# Patient Record
Sex: Male | Born: 1950 | Race: White | Hispanic: No | Marital: Married | Smoking: Former smoker
Health system: Southern US, Community
[De-identification: ages and names within clinical notes are randomized; demographics above are authoritative.]

## PROBLEM LIST (undated history)

## (undated) DIAGNOSIS — G8929 Other chronic pain: Secondary | ICD-10-CM

## (undated) DIAGNOSIS — I219 Acute myocardial infarction, unspecified: Secondary | ICD-10-CM

## (undated) DIAGNOSIS — Z87442 Personal history of urinary calculi: Secondary | ICD-10-CM

## (undated) DIAGNOSIS — I255 Ischemic cardiomyopathy: Secondary | ICD-10-CM

## (undated) DIAGNOSIS — E785 Hyperlipidemia, unspecified: Secondary | ICD-10-CM

## (undated) DIAGNOSIS — Z952 Presence of prosthetic heart valve: Secondary | ICD-10-CM

## (undated) DIAGNOSIS — I350 Nonrheumatic aortic (valve) stenosis: Secondary | ICD-10-CM

## (undated) DIAGNOSIS — I209 Angina pectoris, unspecified: Secondary | ICD-10-CM

## (undated) DIAGNOSIS — M549 Dorsalgia, unspecified: Secondary | ICD-10-CM

## (undated) DIAGNOSIS — I2510 Atherosclerotic heart disease of native coronary artery without angina pectoris: Secondary | ICD-10-CM

## (undated) DIAGNOSIS — M1990 Unspecified osteoarthritis, unspecified site: Secondary | ICD-10-CM

## (undated) DIAGNOSIS — G4733 Obstructive sleep apnea (adult) (pediatric): Secondary | ICD-10-CM

## (undated) DIAGNOSIS — E119 Type 2 diabetes mellitus without complications: Secondary | ICD-10-CM

## (undated) DIAGNOSIS — I480 Paroxysmal atrial fibrillation: Secondary | ICD-10-CM

## (undated) DIAGNOSIS — Z951 Presence of aortocoronary bypass graft: Secondary | ICD-10-CM

## (undated) DIAGNOSIS — R972 Elevated prostate specific antigen [PSA]: Secondary | ICD-10-CM

## (undated) DIAGNOSIS — Z95810 Presence of automatic (implantable) cardiac defibrillator: Secondary | ICD-10-CM

## (undated) DIAGNOSIS — I509 Heart failure, unspecified: Secondary | ICD-10-CM

## (undated) HISTORY — DX: Atherosclerotic heart disease of native coronary artery without angina pectoris: I25.10

## (undated) HISTORY — PX: CAROTID STENT: SHX1301

## (undated) HISTORY — DX: Presence of aortocoronary bypass graft: Z95.1

## (undated) HISTORY — PX: CARDIAC CATHETERIZATION: SHX172

## (undated) HISTORY — DX: Nonrheumatic aortic (valve) stenosis: I35.0

## (undated) HISTORY — PX: OTHER SURGICAL HISTORY: SHX169

## (undated) HISTORY — PX: CATARACT EXTRACTION, BILATERAL: SHX1313

## (undated) HISTORY — DX: Elevated prostate specific antigen (PSA): R97.20

## (undated) HISTORY — PX: EYE SURGERY: SHX253

## (undated) HISTORY — PX: APPENDECTOMY: SHX54

## (undated) HISTORY — DX: Paroxysmal atrial fibrillation: I48.0

## (undated) HISTORY — DX: Hyperlipidemia, unspecified: E78.5

## (undated) HISTORY — PX: TONSILLECTOMY: SUR1361

## (undated) SURGERY — ESOPHAGOGASTRODUODENOSCOPY (EGD) WITH PROPOFOL
Anesthesia: Monitor Anesthesia Care

---

## 1999-04-01 ENCOUNTER — Inpatient Hospital Stay (HOSPITAL_COMMUNITY): Admission: EM | Admit: 1999-04-01 | Discharge: 1999-04-03 | Payer: Self-pay | Admitting: Internal Medicine

## 1999-06-06 ENCOUNTER — Inpatient Hospital Stay (HOSPITAL_COMMUNITY): Admission: EM | Admit: 1999-06-06 | Discharge: 1999-06-08 | Payer: Self-pay | Admitting: Cardiology

## 1999-08-31 ENCOUNTER — Inpatient Hospital Stay (HOSPITAL_COMMUNITY): Admission: EM | Admit: 1999-08-31 | Discharge: 1999-09-01 | Payer: Self-pay | Admitting: Emergency Medicine

## 1999-08-31 ENCOUNTER — Encounter: Payer: Self-pay | Admitting: Emergency Medicine

## 1999-12-20 ENCOUNTER — Encounter: Payer: Self-pay | Admitting: Emergency Medicine

## 1999-12-21 ENCOUNTER — Inpatient Hospital Stay (HOSPITAL_COMMUNITY): Admission: EM | Admit: 1999-12-21 | Discharge: 1999-12-27 | Payer: Self-pay | Admitting: Emergency Medicine

## 1999-12-22 ENCOUNTER — Encounter: Payer: Self-pay | Admitting: Thoracic Surgery (Cardiothoracic Vascular Surgery)

## 1999-12-23 ENCOUNTER — Encounter: Payer: Self-pay | Admitting: Thoracic Surgery (Cardiothoracic Vascular Surgery)

## 1999-12-23 DIAGNOSIS — Z951 Presence of aortocoronary bypass graft: Secondary | ICD-10-CM

## 1999-12-23 HISTORY — PX: CORONARY ARTERY BYPASS GRAFT: SHX141

## 1999-12-23 HISTORY — DX: Presence of aortocoronary bypass graft: Z95.1

## 1999-12-24 ENCOUNTER — Encounter: Payer: Self-pay | Admitting: Thoracic Surgery (Cardiothoracic Vascular Surgery)

## 1999-12-25 ENCOUNTER — Encounter: Payer: Self-pay | Admitting: Thoracic Surgery (Cardiothoracic Vascular Surgery)

## 2001-06-03 ENCOUNTER — Inpatient Hospital Stay (HOSPITAL_COMMUNITY): Admission: EM | Admit: 2001-06-03 | Discharge: 2001-06-05 | Payer: Self-pay | Admitting: *Deleted

## 2001-06-03 ENCOUNTER — Encounter: Payer: Self-pay | Admitting: *Deleted

## 2007-05-20 ENCOUNTER — Ambulatory Visit: Payer: Self-pay | Admitting: Thoracic Surgery (Cardiothoracic Vascular Surgery)

## 2008-01-29 ENCOUNTER — Ambulatory Visit: Payer: Self-pay | Admitting: Cardiology

## 2008-02-28 ENCOUNTER — Inpatient Hospital Stay (HOSPITAL_COMMUNITY): Admission: EM | Admit: 2008-02-28 | Discharge: 2008-03-05 | Payer: Self-pay | Admitting: Emergency Medicine

## 2008-03-19 ENCOUNTER — Ambulatory Visit: Payer: Self-pay | Admitting: Cardiology

## 2008-05-15 ENCOUNTER — Ambulatory Visit: Payer: Self-pay | Admitting: Cardiology

## 2009-12-30 ENCOUNTER — Encounter (INDEPENDENT_AMBULATORY_CARE_PROVIDER_SITE_OTHER): Payer: Self-pay | Admitting: *Deleted

## 2009-12-31 ENCOUNTER — Encounter (INDEPENDENT_AMBULATORY_CARE_PROVIDER_SITE_OTHER): Payer: Self-pay | Admitting: *Deleted

## 2010-09-27 NOTE — Miscellaneous (Signed)
Summary: metoprolol refill-- pt needs appt  Clinical Lists Changes  Medications: Added new medication of METOPROLOL TARTRATE 50 MG TABS (METOPROLOL TARTRATE) Take 1/2  tablet by mouth twice a day - Signed Rx of METOPROLOL TARTRATE 50 MG TABS (METOPROLOL TARTRATE) Take 1/2  tablet by mouth twice a day;  #30 x 0;  Signed;  Entered by: Tye Savoy RN;  Authorized by: Yehuda Savannah, MD, [facility];  Method used: Electronically to [facility] [address], [place], [place], [place]  [address], Ph: [phone], Fax: [phone]    Prescriptions: METOPROLOL TARTRATE 50 MG TABS (METOPROLOL TARTRATE) Take 1/2  tablet by mouth twice a day  #30 x 0   Entered by:   Tye Savoy RN   Authorized by:   Yehuda Savannah, MD, [facility]   Signed by:   Tye Savoy RN on 12/30/2009   Method used:   Electronically to        [facility] (retail)       [place] [address]       [place],   [address]       Ph: [phone]       Fax: [phone]   RxID:   [id number]

## 2010-09-27 NOTE — Miscellaneous (Signed)
  Clinical Lists Changes  Medications: Added new medication of PLAVIX 75 MG TABS (CLOPIDOGREL BISULFATE) TAKE 1 TAB DAILY

## 2011-01-10 NOTE — Letter (Signed)
May 15, 2008    Scott A. Wolfgang Phoenix, [address] [place] [address]   RE:  SHRIYAAN, MCAREE  MRN:  [id number]  /  DOB:  [birth year]   Dear Nicki Reaper,   [name] returns to the office for continued assessment and treatment  of coronary disease.  Since last visit, he has done well.  He was unable  to enroll in cardiac rehabilitation due to cost considerations.  He is  receiving clopidogrel with a reasonable copay .  His other medications  are unchanged.   PHYSICAL EXAMINATION:  GENERAL:  Pleasant overweight gentleman in no  acute distress.  The weight is 280, 9 pounds more than in 2001.  Blood  pressure 110/75, heart rate 70 and regular, and respirations 14.  NECK:  No jugular venous distention; no carotid bruits.  LUNGS:  Clear.  CARDIAC:  Distant first and second heart sounds.  ABDOMEN:  Soft and nontender; no organomegaly.  EXTREMITIES:  No edema.   IMPRESSION:  Mr. Voce is doing well from symptomatic standpoint after  complex multivessel percutaneous intervention.  He is once again  cautioned about not stopping clopidogrel.  We will check a lipid  profile.  Blood pressure control is good.  There are no symptoms to  suggest recurrent myocardial ischemia.  I will plan to reassess this  nice gentleman in 6 months.    Sincerely,      Cristopher Estimable. Lattie Haw, MD, [facility]  Electronically Signed    RMR/MedQ  DD: 05/15/2008  DT: 05/16/2008  Job #: [id number]

## 2011-01-10 NOTE — Discharge Summary (Signed)
NAMEDEVEION, BARTLES               ACCOUNT NO.:  [id number]   MEDICAL RECORD NO.:  [id number]          PATIENT TYPE:  INP   LOCATION:  6525                         FACILITY:  [facility]   PHYSICIAN:  Juanda Bond. Burt Knack, MD  DATE OF BIRTH:  04/04/1951   DATE OF ADMISSION:  02/28/2008  DATE OF DISCHARGE:  03/05/2008                               DISCHARGE SUMMARY   PRIMARY CARDIOLOGIST:  Cristopher Estimable. Lattie Haw, MD, [facility]   PRIMARY CARE Jozeph Persing:  Elayne Snare. Luking, MD   DISCHARGE DIAGNOSIS:  Non-ST-segment elevation myocardial infarction.   SECONDARY DIAGNOSES:  1. Coronary artery disease status post coronary artery bypass graft x6      in 2001.  2. Hyperlipidemia.  3. Ischemic cardiomyopathy with ejection fraction of 40%.  4. Obesity.  5. Remote tobacco abuse.  6. History of noncompliance.  7. Hypertension.   ALLERGIES:  No known drug allergies.   PROCEDURES:  Left heart cardiac catheterization with successful  percutaneous coronary intervention and stenting of the proximal and mid  sequential vein grafts to the OM2 and PLA with placement of 2 Vision  bare-metal stents followed by percutaneous coronary intervention and  stenting of the proximal and mid vein graft to the diagonal and the vein  graft to the ramus intermedius with placement of PROMUS drug-eluting  stents, 3 in all.   HISTORY OF PRESENT ILLNESS:  A 60 year old Caucasian male with prior  history of coronary artery bypass grafting in 2001, who has been off his  medications for some time.  He has a history of stable angina; however,  over the past several months, this is becoming more frequent and severe  and with less activity.  Symptoms acutely worsened approximately 2-3  days prior to admission, prompting him to present to the [facility] ED,  where ECG showed no acute changes; however, his troponin was elevated to  0.26.  The patient was admitted for further evaluation and management of  non-ST-elevation MI.   HOSPITAL  COURSE:  Mr. Mccallen peaked a CK at 109, MB at 14.4, and  troponin I at 0.69.  He was scheduled for cardiac catheterization, which  took place on Monday, March 02, 2008, revealing multivessel coronary  artery disease with 80% stenosis in the vein graft to the PDA, 99%  stenosis in the vein graft to the obtuse marginal, 80% stenosis in the  vein graft to the ramus intermedius.  His EF was 40% with inferior  akinesis and diffuse hypokinesis.  Films were reviewed by Dr. Sherren Mocha, and the patient underwent successful PCI and stenting of the  vein graft to the OM2/PDA with placement of 2 Vision bare-metal stents.  The patient tolerated this procedure well, and a decision was made to  perform staged PCI of the vein graft to the ramus and vein graft to the  PDA.  Mr. Bonawitz has no recurrent chest discomfort, and was taken back  to the cath lab on March 04, 2008, where he underwent successful PCI and  stenting of the vein graft to the PDA with placement of 2 PROMUS drug-  eluting stents.  The vein graft to the ramus was also successfully  treated with a PROMUS drug-eluting stent.  Post procedures, Mr. Gaertner  has been ambulating without difficulty or recurrent symptoms.  He has  been counseled and reports medication compliance.  He has been  maintained on beta-blocker, statin, aspirin, and Plavix therapy.  We  have added low-dose ACE inhibitor therapy secondary to reduce the EF.  Mr. Brandle will be discharged home today in good condition and has been  seen and educated by the cardiac rehab team.   DISCHARGE LABS:  Hemoglobin 13.4, hematocrit 38.2, WBC 8.6, platelets  180, and MCV 91.3.  Sodium 139, potassium 3.6, chloride 104, CO2 27, BUN  7, creatinine 0.70, and glucose 110.  Total bilirubin 0.8, alkaline  phosphatase 75, AST 28, ALT 37, albumin 3.5, CK 43, MB 4.2, and troponin-  I 0.65.  Total cholesterol 211, triglycerides 168, HDL 32, LDL 145, and  calcium 8.2.  D-dimer 0.43.  Hemoglobin  A1c 5.9.  TSH 1.886.   DISPOSITION:  The patient is being discharged home today in good  condition.   FOLLOWUP PLANS AND APPOINTMENTS:  We have arranged for followup basic  metabolic panel in 1 week, since we are initiating ACE inhibitor  therapy.  We have asked him to follow up with Dr. Wolfgang Phoenix as previously  scheduled.  He is to follow up with Dr. Jacqulyn Ducking on March 19, 2008  at 10:30 a.m.   DISCHARGE MEDICATIONS:  1. Aspirin 325 mg daily.  2. Plavix 75 mg daily.  3. Metoprolol 25 mg b.i.d.  4. Crestor 40 mg daily.  5. Lisinopril 5 mg daily.  6. Nitroglycerin  0.4 mg sublingual p.r.n. chest pain.   OUTSTANDING LAB STUDIES:  BMET in 1 week.   DURATION OF DISCHARGE/ENCOUNTER:  65 minutes including physician time.      Murray Hodgkins, ANP      Juanda Bond. Burt Knack, MD  Electronically Signed    CB/MEDQ  D:  03/05/2008  T:  03/06/2008  Job:  [id number]   cc:   Nicki Reaper A. Wolfgang Phoenix, MD

## 2011-01-10 NOTE — Assessment & Plan Note (Signed)
[facility] CARDIOLOGY OFFICE NOTE   CRUSE, HEINZERLING                      MRN:          [id number]  DATE:03/19/2008                            DOB:          11-23-1950    CARDIOLOGIST:  Cristopher Estimable. Lattie Haw, MD, [facility]   PRIMARY CARE PHYSICIAN:  Scott A. Wolfgang Phoenix, MD   REASON FOR VISIT:  Post-hospitalization followup.   HISTORY OF PRESENT ILLNESS:  [name] [name] is a 60 year old male patient  with a history of prior CABG in 2001, who recently presented to [facility] for chest pain.  He had apparently been off all his  medications and had not been seen in followup in quite some time.  His  troponin peaked at 0.69.  He was diagnosed with non-ST-elevation  myocardial infarction and taken for cardiac catheterization.  He was  noted to have multivessel CAD as well as significant disease in his vein  grafts and underwent multivessel PCI.  The patient underwent stenting of  the vein graft to the obtuse marginal-2 and PDA with two bare-metal  stents.  He had staged intervention with PCI of the vein graft to the  ramus intermediate and PCI to the vein graft to the PDA.  The vein graft  to the PDA received two PROMUS drug-eluting stents and the vein graft to  the ramus received one PROMUS drug-eluting stent.  He remained stable  post intervention and was eventually discharged to home.   In the office today, the patient states he is doing well.  He does feel  somewhat tired.  He also notes a funny feeling in his chest.  This  occurs when he exerts himself.  He has some type of cardio machine at  home that he has been using since discharge from the hospital.  He  denies any symptoms reminiscent of his previous angina.  He denies  shortness of breath, syncope, near-syncope, orthopnea, PND, or pedal  edema.  He denies any associated arm or jaw symptoms, nausea, or  diaphoresis.   CURRENT MEDICATIONS:  1. Metoprolol 50  mg half tablet b.i.d.  2. Lisinopril 5 mg daily.  3. Crestor 40 mg daily.  4. Aspirin 325 mg daily.  5. Plavix 75 mg daily.  6. Nitroglycerin p.r.n. chest pain.   PHYSICAL EXAMINATION:  GENERAL:  He is a well-nourished, well-developed  male, in no acute distress.  VITAL SIGNS:  Blood pressure is 104/71, pulse 79, and weight 298 pounds.  HEENT:  Normal.  NECK:  Without JVD.  CARDIAC:  Normal S1 and S2.  Regular rate and rhythm.  LUNGS:  Clear to auscultation bilaterally.  ABDOMEN:  Soft and nontender.  EXTREMITIES:  Without edema.  Calves soft and nontender.  SKIN:  Warm and dry.  NEUROLOGIC:  He is alert and orient x3.  Cranial nerves II-XII are  grossly intact.  VASCULAR:  Right femoral arteriotomy site without hematoma or bruit.   Electrocardiogram, sinus rhythm with a heart rate of 73, normal axis,  inferior Q-waves, nonspecific ST-T wave changes.   IMPRESSION:  1. Coronary artery disease.  a.     Status post coronary artery bypass graft in 2001.      b.     Status post multivessel percutaneous coronary intervention       as outlined above in the setting of non-ST-elevation myocardial       infarction in July 2009.  2. Ischemic cardiomyopathy with an ejection fraction of 40%.  3. Dyslipidemia.  4. Hypertension.  5. Remote tobacco abuse.  6. Chest pain.   PLAN:  1. [name] returns to the office today for followup post      hospitalization.  Overall, he is doing well.  He does have some      type of discomfort in his chest when he overexerts himself.  I      think he is probably doing too much too soon after his myocardial      infarction.  I have asked him to cut back on his activity and to go      to cardiac rehab.  We will make that referral for him.  He is not      describing symptoms of his previous angina.  I do not think he      needs adjustments in his medical therapy at this time nor do I      think he needs nuclear testing performed.  I have discussed  this      with Dr. Lattie Haw, who agreed.  2. He has been advised to remain on Plavix and aspirin.  3. The patient will be brought back in followup with Dr. Lattie Haw in      the next 6 weeks or sooner p.r.n.      Richardson Dopp, PA-C  Electronically Signed      Cristopher Estimable. Lattie Haw, MD, [facility]  Electronically Signed   SW/MedQ  DD: 03/19/2008  DT: 03/20/2008  Job #: [id number]   cc:   Nicki Reaper A. Wolfgang Phoenix, MD

## 2011-01-10 NOTE — Cardiovascular Report (Signed)
[name] [name], [name] [name]               ACCOUNT NO.:  [id number]   MEDICAL RECORD NO.:  [id number]          PATIENT TYPE:  INP   LOCATION:  2807                         FACILITY:  [facility]   PHYSICIAN:  Juanda Bond. Burt Knack, MD  DATE OF BIRTH:  05-07-1951   DATE OF PROCEDURE:  03/02/2008  DATE OF DISCHARGE:                            CARDIAC CATHETERIZATION   DATE OF PROCEDURE:  March 02, 2008.   PROCEDURE:  PTCA and stenting of the saphenous vein graft to right  posterolateral branch.   INDICATION:  [name] [name] is a 60 year old gentleman with extensive  coronary artery disease.  He is status post coronary bypass surgery in  2001.  He presented with a non-ST-elevation MI and underwent diagnostic  catheterization by Dr. Percival Spanish.  The catheterization demonstrated  severe graft disease with his culprit lesion and a 99% focal stenosis in  the saphenous vein graft to right posterolateral branch.  There was a  sequence portion of this graft to an OM branch with that portion  occluded.  There was TIMI II flow in the vessel.  The other saphenous  vein grafts had 80% stenoses that appeared significant but noncritical.  I elected to intervene on critical lesion with consideration for PCI of  the other vessels in a staged manner.  The patient has already undergone  full diagnostic study, and I suspected that we would be limited by  contrast load.   A 6-French LCB guide catheter was used.  The patient was on Integrilin.  A 5000 units of heparin was given.  Once therapeutic ACT was achieved, a  cougar guidewire was passed into the distal vessel.  The lesion was  predilated with a 2.5 x 15-mm balloon up to 8 atmospheres.  Following  balloon dilatation, there was some improvement in the lesion with TIMI  III flow present.  I attempted to pass a Spider distal embolic  protection device beyond the lesion, but it would not pass.  I then  predilated the lesion again with a larger balloon.  A 3.0 x 20-mm apex  balloon was used and was dilated to 8 and 10 atmospheres on multiple  inflations.  I was then able to pass the spider device distal to the  lesion into the distal body of the graft.  The spider device was  deployed without difficulty.  The lesion was stented with a 4.0 x 18-mm  Vision stent which was deployed at 14 atmospheres.  The stent was well  expanded.  I elected to post dilate the stent with a 4.5 x 15-mm Quantum  Maverick balloon which was taken to 16 atmospheres.  Following post  dilatation, there was an excellent angiographic result at the site of  the severe stenosis.  However, there was a filling defect with  significant stenosis and sluggish flow at the proximal portion of the  graft.  This had the appearance of thrombus.  An ACT was immediately  checked and was found to be 207.  At that point, 2000 units of  additional heparin was given.  Aspiration thrombectomy was performed  with a fetch catheter.  Following aspiration, there was residual  haziness.  I elected to stent that area with a 4-0 x 15-mm Vision stent  which was deployed at 14 atmospheres.  Following stenting, there was  resolution of the filling defect with an excellent angiographic result.  At that point, the Spider device was retrieved.  Final angiography  demonstrated brisk TIMI III flow throughout the vein graft with an  excellent angiographic result.  The patient tolerated the procedure  well.  An Angio-Seal device was used to close the femoral arteriotomy.   ASSESSMENT:  Successful percutaneous coronary intervention of severe  stenosis of the saphenous vein graft to right posterolateral branch.   RECOMMENDATION:  [name] [name] will complete 12 hours of Integrilin.  He  was given 600 mg of Plavix on the table.  He should continue on aspirin  and Plavix for 1 year in the setting of his non-ST-elevation MI.  We  will consider staged PCI of the remaining 2 saphenous vein grafts in  approximately 48 hours  depending on the patient's stability and followup  of his kidney function.      Juanda Bond. Burt Knack, MD  Electronically Signed     Juanda Bond. Burt Knack, MD  Electronically Signed    MDC/MEDQ  D:  03/02/2008  T:  03/03/2008  Job:  [id number]

## 2011-01-10 NOTE — H&P (Signed)
[name], [name]               ACCOUNT NO.:  [id number]   MEDICAL RECORD NO.:  [id number]          PATIENT TYPE:  INP   LOCATION:  3701                         FACILITY:  [facility]   PHYSICIAN:  Minus Breeding, MD, FACCDATE OF BIRTH:  [birth year]   DATE OF ADMISSION:  02/28/2008  DATE OF DISCHARGE:                              HISTORY & PHYSICAL   PRIMARY CARE PHYSICIAN:  [name] Drown, MD   PRIMARY CARDIOLOGIST:  [name] [name].   CHIEF COMPLAINT:  Chest pain.   HISTORY OF PRESENT ILLNESS:  [name] [name] is a 60 year old male with a  history of coronary artery disease.  He has a history of substernal  chest pain, but states the episodes have been getting more frequent and  requiring less exertion to bring them on.  They reach at 6 or 7/10.  He  has symptoms with exertion and also has symptoms with supine position  and after meals.  The last 3 days they have gotten severe enough that he  has taken sublingual nitroglycerin and used nitroglycerin spray.  He has  taken a total of 11 or 12 tablets plus a couple of sprays over the last  3 days.  The symptoms are not associated with shortness of breath,  nausea, vomiting, or diaphoresis, but the more severe symptoms do  radiate down his left arm.  He came to the emergency room tonight  because the crescendo pattern was concerning him.  His symptoms were  worse than usual and did not resolve as usual with nitroglycerin.  Currently, he is complaining of 2 or 3/10 chest pain.   PAST MEDICAL HISTORY:  1. Status post aortocoronary bypass surgery [name] 2001 with LIMA-to-LAD,      SVG-to-ramus intermedius branch, SVG-to-OM2 branch, sequential      graft to posterolateral and SVG-to-acute marginal with sequential      to the PDA.  2. Status post cardiac catheterization [name] 2002 showing LAD occlusion      just beyond the first diagonal, circumflex occluded proximally, RCA      occluded proximally, patent grafts with these OM2 occluded just      after  vein graft insertion, but slow flow [name] the SVG-to-PDA and PL.      His EF at that time was 49%.  3. Hyperlipidemia.  4. Obesity.  5. Remote history of tobacco use.  6. History of noncompliance.  7. History of multiple percutaneous and interventions prior to bypass      surgery.   SURGICAL HISTORY:  He is status post cardiac catheterizations as well as  bypass surgery and right toe surgery.   ALLERGIES:  No known drug allergies.   CURRENT MEDICATIONS:  Aspirin 325 mg daily and occasional multivitamins.   SOCIAL HISTORY:  Lives [name] with his wife and is a retired  Furniture conservator/restorer, but takes care of the house and is very active.  He has  approximately a 20-pack year history of tobacco use, but quit [name] 2001.  He denies alcohol or drug abuse.   FAMILY HISTORY:  Both of his parents are deceased, but neither of his  parents nor any siblings have a history of premature coronary artery  disease.   REVIEW OF SYSTEMS:  Significant for chronic dyspnea on exertion, which  has not changed recently.  He has arthralgias that are chronic [name] his  right foot.  The patient states he has been told that only amputation  will fix the problem.  He denies hematemesis, hemoptysis, or melena.  There has been no recent illnesses.  No fevers or chills.  Full 14-point  review of systems is, otherwise, negative.   PHYSICAL EXAMINATION:  VITAL SIGNS:  Temperature is 98.1, blood pressure  150/87, pulse 97, respiratory rate 26, and O2 saturation 97% on room  air.  GENERAL:  He is a well-developed obese white male [name] no acute distress.  HEENT:  Normal.  NECK:  There is no lymphadenopathy.  No thyromegaly.  No bruit.  No JVD  noted.  CV:  His heart is regular [name] rate and rhythm with S1 and S2.  No  significant murmur, rub, or gallop is noted.  Distal pulses are intact  [name] all four extremities and no femoral bruits are appreciated.  LUNGS:  He has no rales, rhonchi, or wheezing.  SKIN:  No rashes or lesions  are noted.  ABDOMEN:  Soft and nontender with active bowel sounds and no rebound or  guarding.  There is no hepatosplenomegaly by percussion.  EXTREMITIES:  There is no cyanosis, clubbing, or edema noted.  MUSCULOSKELETAL:  There is no joint deformity or effusions.  NEURO:  He is alert and oriented.  Cranial nerves II through XII are  grossly intact.   CHEST X-RAY:  No acute disease.   Sinus rhythm, rate 70 with no acute ischemic changes.   LABORATORY DATA:  His hemoglobin 15.5, hematocrit 44.7, WBC is 8.2, and  platelets 229.  Sodium 142, potassium 3.8, chloride 104, BUN 9,  creatinine 0.9, and glucose 90.  Point-of-care markers show a normal  myoglobin of 45.6 and a normal CK-MB at 2.2, but his troponin is mildly  elevated at 0.26.   IMPRESSION:  Chest pain:  He has some atypical features, i.e., that it  is positional and it sometimes occurs after meals.  However, the  exertional component is quite typical and has been progressive recently,  worsening over the last 3 days.  His troponin is mildly elevated.  He  will be admitted and we will continue to cycle enzymes.  He will be  started on heparin as well as IV nitroglycerin and a beta blocker.  We  would check a D-dimer and start a statin as well.  He will be scheduled  for cardiac catheterization on Monday.  A nutrition consult is called  for his obesity and Lipitor 80 mg daily is being empirically started  with a lipid profile pending at the time of dictation.      [name] Ferries, PA-C      Minus Breeding, MD, [facility]  Electronically Signed    RB/MEDQ  D:  02/28/2008  T:  02/29/2008  Job:  [id number]

## 2011-01-13 NOTE — Cardiovascular Report (Signed)
. [facility]  Patient:    [name] [name], [name] [name]                      MRN: [id number] Proc. Date: 12/22/99 Adm. 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  He subsequently had two tandem overlying NIR stents distal to and overlying the wall stent by Dr. Vicenta [name] on June 07, 1999, for recurrent disease.  In January he had in-stent re-stenosis in the distal stents and underwent balloon angioplasty.  He returns now with recurrent angina.  He was scheduled for possible brachytherapy today.  DESCRIPTION OF PROCEDURE:  The procedure was performed via the right femoral artery using an arterial sheath and 6 French preformed coronary catheters.  A front wall arterial puncture was performed and Omnipaque contrast was used. The right femoral artery was closed with Perclose at the end of the procedure. The patient tolerated the procedure well and left the laboratory in satisfactory condition.  RESULTS:  The left main coronary artery:  The left main coronary artery was free of significant disease.  Left anterior descending:  The left anterior descending artery was diffusely diseased with 70-80% segmental proximal stenosis and 80% segmental mid stenosis, though they gave rise to a four moderate sized septal perforators and three diagonal branches.  There was a first optimal diagonal branch which was quite large that had an 80% proximal stenosis.  Circumflex artery:  The circumflex artery gave rise to an intermediate branch and terminated into two posterolateral branches.  There was 95% stenosis in the mid vessel.  Right coronary artery:  The right coronary is a large vessel that gave rise to a right ventricular branch, a posterior  descending branch and two posterolateral branches.  There was a wall stent and two overlying NIR stents which extended from near the ostium to the distal vessel not too far from the posterior descending branch.  There was 70% narrowing in the proximal right just proximal to the wall stent.  There was 95% segmental stenosis in the distal most NIR stent and there was 50% distal stenosis in the native RV and 90% segmental stenosis in the AV branch before the large posterolateral branch with TIMI-2 flow in the posterolateral branch.  LEFT VENTRICULOGRAPHY:  The left ventriculogram performed in the RAO projection showed hypokinesis of the inferobasal wall.  The overall wall motion was good with a calculated ejection fraction of 54%.  HEMODYNAMIC DATA:  The aortic pressure was 133/86 and the left ventricular pressure was 133/28.  CONCLUSIONS:  Coronary artery disease, status post multiple interventions on the right coronary artery as described above with 70% proximal stenosis in the right coronary artery, 95% stenosis in the mid to distal right coronary artery within the third stent, and 90% stenosis in the posterolateral branch of the right coronary artery, 70-80% proximal and 80% mid stenosis in the left anterior descending artery with 80% stenosis in the large diagonal branch and 95% stenosis in the mid circumflex artery with inferior hypokinesis.  RECOMMENDATIONS:  The patient has extensive disease and I think at this point that bypass surgery would be the best option.  I reviewed these  with Dr. Vicenta [name] and we will plan surgical consultation. DD:  12/22/99 TD:  12/23/99 Job: [id number]

## 2011-01-13 NOTE — Discharge Summary (Signed)
[place]. [facility]  Patient:    [name] [name], [name] [name]                      MRN: [id number] Adm. Date:  [id number] Disc. Date: 12/27/99 Attending:  Darylene Price Dictator:   Ricki Miller, P.A.C. CC:         Cristopher Estimable. Lattie Haw, M.D. [place]. Roxy Manns, M.D.             [name], M.D.                           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  He has also had multiple percutaneous interventions.  On the date of admission, the patient represented to the emergency room with an  episode of chest pain unrelieved with nitroglycerin.  The pain radiated to the eft side and left arm.  He was felt to require admission for further evaluation and  treatment including rule out myocardial infarction and possible repeat catheterization or further intervention.  PAST MEDICAL HISTORY:  Coronary artery disease, multiple percutaneous interventions.  Hypercholesterolemia.  History of tobacco abuse.  History of nephrolithiasis.  History of noncompliance with medications.  Also status post cholecystectomy.  PAST SURGICAL HISTORY:  Also includes a right first toe surgery.  MEDICATIONS: 1. Aspirin 325 mg q.d. 2. Nitroglycerin sublingual p.r.n.  ALLERGIES:  None.  SOCIAL HISTORY:  Please see the history and physical done at the time of admission.  FAMILY HISTORY:  Please see the history and physical done at the time of admission.  REVIEW OF SYSTEMS:  Please see the history and physical done at the time of admission.  PHYSICAL EXAMINATION:   Please see the history and physical done at the time of  admission.  HOSPITAL COURSE:  The patient was admitted for unstable anginal symptoms.  He was started on heparin.  He ruled out for myocardial infarction by enzymes.  He was  felt to be a candidate for recardiac  catheterization.  This was undertaken December 22, 1999 by Dr. Vanna Scotland. [name].  Findings included severe three vessel coronary artery disease with mild left ventricular dysfunction and with class IV unstable angina.  It was felt that the patient was a candidate for coronary artery bypass grafting as his best revascularization option.  The findings at catheterization showed mild left ventricular global dysfunction with an ejection fraction of 40-50%.  The LAD had an 80% proximal and mid-lesion.  The circumflex had a 95% mid-lesion.  The right coronary artery had both 70% proximal and 95% mid-stenosis in the stent and the  posterolateral had 90% stenosis.  Surgical consultation was obtained with Dr. Valentina Gu. Roxy Manns, who evaluated the  patient and his studies, and agreed with recommendations of coronary artery bypass grafting as his best revascularization due to severity of the anatomical findings and the increasing nature of symptoms.  The patient was introduced to the risks and benefits of the procedure and agreed with proceeding further and the procedure as scheduled.  The procedure was on December 23, 1999.  The patient underwent the following procedure:  Coronary artery bypass grafting x 6.  The following grafts were placed: Left internal mammary artery to the LAD, sequential saphenous vein graft to  the  acute marginal and posterior descending, saphenous vein graft to the intermediate, sequential saphenous vein graft to the second circumflex marginal and the right  posterolateral coronary artery.  Cross clamp time was 78 minutes.  Pump time was 140 minutes.  The patient tolerated the procedure well and was taken to the surgical intensive care unit in stable condition.  It was noted no blood products were required and the patient came off cardiopulmonary bypass in normal sinus rhythm on no inotropic agents.  The patient has done well.  He initially showed some  restlessness but was able o be extubated without significant difficulty.  He remained hemodynamically stable, although his blood pressure was somewhat in the lower systolic range, [id number] to [id number]. Oxygen has been weaned and he maintained good saturations on room air.  He is tolerating a slow diuresis that will be continued postdischarge.  His incisions are healing well without signs of infection.  He is tolerating a routine advancement in activities commensurate for level of postoperative convalescence.  His laboratory values are stable.  Most recent hemoglobin and hematocrit dated December 25, 1999 showed hemoglobin of 10.3, hematocrit 30.4, white blood cell count 11.7.  Electrolytes, BUN, and creatinine all within normal limits.  He has maintained normal sinus rhythm without ectopy or dysrhythmias.  Currently, he is felt to be quite stable for tentative discharge in the morning Dec 27, 1999 pending morning round and re-evaluation.  DISCHARGE MEDICATIONS: 1. Coated aspirin 1 p.o. q.d. 2. Tylox 1-2 q.4-6h. p.r.n. for pain. 3. Lopressor 25 mg p.o. q.12h. 4. Lasix 40 mg q.d. for 7 days. 5. Potassium chloride 20 mEq q.d. for 7 days.  FOLLOW-UP:  Follow-up will include Dr. Valentina Gu. Roxy Manns and Dr. Cristopher Estimable. [place].  DISCHARGE INSTRUCTIONS:  The patient will receive written instructions in regard to medication, activity, with diet, wound care, and follow-up.  FINAL DIAGNOSES: 1. Coronary artery disease, status post inferior myocardial infarction in    August of 2000. 2. Multiple percutaneous transluminal coronary angioplasties and stenting    procedures with the last one performed August 31, 1999. 3. Hypercholesterolemia. 4. Tobacco abuse. 5. Nephrolithiasis. 6. Cholecystectomy. 7. Noncompliance with medications. DD:  12/26/99 TD:  12/27/99 Job: [id number]

## 2011-01-13 NOTE — H&P (Signed)
[place]. [facility]  Patient:    [name] [name], [name] [name] Visit Number: [id number] MRN: [id number]          Service Type: MED Location: [id number] Attending Physician:  [name] Dictated by:   Margarita Sermons, P.A.C. [facility] Admit Date:  06/03/2001   CC:         Dr. Maximino Sarin, [place]   History and Physical  CHIEF COMPLAINT:  Chest pain.  HISTORY OF PRESENT ILLNESS:  [name] [name] is 60 year old, white, married male here today with a chief complaint of chest pain.  The patient has known coronary disease.  He has had multiple prior interventions.  He underwent CABG by Valentina Gu. Roxy Manns, M.D., in April of 2001, at which time he received an LIMA to the LAD, SVG to the AM-PDA, SVG to the INT, and SVG to the OM2-PL of the RCA.  He is admitted now with "the same kind of pain."  He has had a couple episodes of chest pain over the last several months after eating a [place] steak and cheese sandwich.  Last night he was awaken with chest heaviness and pressure radiating to his left arm and left foot.  He took Nitrolingual spray x 3 with substantial relief.  During the day today his pain has lingered and has been much less intense, but has been present off and on most of the day. His wife finally convinced him to come to the emergency room tonight.  He is admitted now for further evaluation and treatment.  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  He quit smoking about one year ago.  He has a history of kidney stones and is status post cholecystectomy.  He has had right great toe surgery as well.  As above, he has had multiple prior percutaneous interventions dating back to an inferior MI occurring in August of 2000.  PRESENT MEDICATIONS: 1. Zocor 40 mg q.d. 2. Aspirin one daily. 3. Nitrolingual spray p.r.n.  REVIEW OF SYSTEMS:  No change in bowel habits, melena, or hematochezia.  No GU symptoms.  Weight has been stable.   He does have occasional reflux-type symptoms.  The system review is otherwise negative.  ALLERGIES:  None to medications.  PHYSICAL EXAMINATION:  An overweight, pleasant gentleman.  VITAL SIGNS:  Blood pressure 140/80 in sinus rhythm.  HEENT:  Extraocular muscles intact.  Sclerae nonicteric.  Conjunctivae injected.  Lids normal.  NECK:  Supple without thyromegaly.  No bruits.  No JVD.  HEART:  Sinus rhythm.  S1 and S2 normal.  S4 is present.  There is no murmur.  LUNGS:  Clear to P&A bilaterally.  ABDOMEN:  Soft without masses, hepatosplenomegaly, bruits, or tenderness.  EXTREMITIES:  Active pedal pulses.  NEUROLOGIC:  Normal exam.  IMPRESSION: 1. Coronary artery disease with prior percutaneous coronary interventions    including a stent to the right coronary artery in 2000 culminating in    coronary artery bypass grafting x 6 in April of 2001.  Admitted with    recurrent chest pain "the same as before." 2. Treated hyperlipoproteinemia. 3. Exogenous obesity. 4. History of cigarette use, quit approximately one year ago. 5. Status post multiple surgical procedures as above.  DISPOSITION:  The patient will be started on heparin and nitroglycerin.  We plan to catheterize him in the morning. Dictated by:   Margarita Sermons, P.A.C. [place] Attending Physician:  [name] DD:  06/03/01 TD:  06/04/01 Job: [phone]  [id number]

## 2011-01-13 NOTE — Op Note (Signed)
[place]  Patient:    [name] [name], [name] [name]                      MRN: [id number] Proc. Date: 12/23/99 Adm. Date:  [id number] Attending:  Darylene Price CC:         Cristopher Estimable. Lattie Haw, M.D.             Bruce Alfonso Patten Olevia Perches, M.D.             Jermel C. Wall, M.D.             [name], M.D.             CVTS office                           Operative Report  PREOPERATIVE DIAGNOSIS:  Severe three vessel coronary artery disease with class  4 unstable angina.  POSTOPERATIVE DIAGNOSIS:  Severe three vessel coronary artery disease with class 4 unstable angina.  PROCEDURE:  Median sternotomy for coronary artery bypass grafting x 6 (left internal mammary artery to distal left anterior descending coronary artery, saphenous vein graft to ramus intermediate branch, saphenous vein graft to second circumflex marginal branch and sequential saphenous vein graft to right posterolateral branch, saphenous vein graft to acute marginal branch, and sequential saphenous vein graft to posterior descending coronary artery).  SURGEON:  Valentina Gu. Roxy Manns, M.D.  ASSISTANT:  Shelle Iron, P.A.  ANESTHESIA:  General.  BRIEF CLINICAL NOTE:  The patient is a 60 year old obese white male from [place], [place], followed by [name], M.D., and more recently followed through [facility] cardiology service by Cristopher Estimable. Lattie Haw, M.D., Marijo Conception. Wall, M.D., and Vanna Scotland. Olevia Perches, M.D. with coronary artery disease. The patient sustained an inferior myocardial infarction in August 2000.  He underwent angioplasty with stent placement of the right coronary artery at that  time.  Since that time, he has undergone four successful percutaneous base interventions for restenosis of the right coronary artery.  The most recent such procedure was performed in January of 2001.  He now presents with a one month history of progressive symptoms of exertional angina and  a two day history of chest pain occurring at rest.  He ruled out for acute myocardial infarction by serial  cardiac enzymes.  Cardiac catheterization performed by Dr. Olevia Perches demonstrates severe three vessel coronary artery disease with mild left ventricular dysfunction.  OPERATIVE CONSENT:  The patient and his family are counseled at length regarding the indications and potential benefits of coronary artery bypass grafting. They understand the associated risks of surgery including, but not limited to risks f death, stroke, myocardial infarction, bleeding requiring blood transfusion, arrhythmia, infection, and recurrent coronary artery disease.  They accept these risks as well as any unforeseen complications and agreed to proceed with surgery as described.  DESCRIPTION OF PROCEDURE:  The patient was brought to the operating room on the  above mentioned date and invasive hemodynamic monitoring was established by the  anesthesia service under the care and direction of Charles E. Frederick, M.D. he patient was placed in the supine position on the operating table.  Following the induction with general endotracheal anesthesia, the patients chest, abdomen, both groins, and both lower extremities are prepared and draped in a sterile manner.  A median sternotomy incision was performed and the left internal mammary artery is dissected from the chest wall and prepared by  bypass grafting.  The left internal mammary artery is notably a good quality conduit for bypass grafting. Simultaneously, saphenous vein was obtained from the patients lower extremities  through a series of longitudinal incisions.  Initially, saphenous vein was obtained from the right lower leg.  The majority of this is good quality conduit for bypass grafting.  However, just below the knee, the saphenous vein was noted to bifurcate in several places and portions of it were felt to be too small for  grafting. Additional segment of saphenous vein was obtained from the patients left lower eg through additional saphenous vein harvest incisions.  This conduit is felt to be good quality for bypass grafting.  The patient is heparinized systemically.  The pericardium was opened.  The ascending aorta is inspected and is notably free of any palpable plaques or calcifications.  The ascending aorta and the right atrial appendage are cannulated for cardiopulmonary bypass.  Adequate heparinization is verified.  Cardiopulmonary bypass was begun and the surface of the heart was inspected. There is mild to moderate left ventricular hypertrophy and dilatation.  Distal sites re selected for coronary bypass grafting.  Portions of saphenous vein and the left  internal mammary artery are trimmed to appropriate length.  A temperature probe is placed in the left ventricular septum and a styrofoam pad was placed to protect the left phrenic nerve from thermal injury.  A cardioplegia catheter was placed in he ascending aorta.  The patient is cooled to 28 degrees systemic temperature.  The aortic cross-clamp is applied and cardioplegia is delivered in an antegrade fashion through the aortic root.  Additional doses of cardioplegia are administered both through the aortic root and down subsequently placed vein grafts throughout the cross-clamp portion of the operation to maintain septal temperature below 15 degrees centigrade.  Ice saline slush is applied for topical hypothermia.  The following distal coronary  anastomoses are performed:  #1 - The acute marginal branch off from the distal right coronary artery is grafted with a saphenous vein graft in a side-to-side fashion using running 7-0 Prolene  suture.  This is a very large branch and runs parallel to the posterior descending coronary artery.  It measures greater than 1.5 mm at the site of distal bypass nd is of good quality.  #2 -  The posterior descending coronary artery is grafted using a sequential  saphenous vein graft off of the vein placed to the acute marginal branch.  This  coronary measures 1.4 mm at the site of distal bypass and is of fair quality.  #3 - The second circumflex marginal branch is grafted using a saphenous vein graft in a side-to-side fashion using running 7-0 Prolene suture.  This coronary measures 1.2 mm in diameter and is of fair quality.  #4 - The posterolateral branch off of the distal right coronary artery is grafted using a sequential saphenous vein graft off of the vein placed at the circumflex marginal branch.  This coronary measures 1.6 mm in diameter and is of good quality at the site of distal bypass.  #5 - The ramus intermediate branch is grafted with the saphenous vein graft in n end-to-side fashion using running 7-0 Prolene suture.  This coronary measures  1.8 mm in diameter, and is of good quality at the site of distal bypass.  #6 - The distal left anterior descending coronary artery is grafted with the left internal mammary artery using a running 8-0 Prolene suture.  This coronary measures 1.7 mm in diameter  at the site of distal bypass and is of good quality.  The septal temperature is noted to rise rapidly and dramatically upon reperfusion of the left internal mammary artery.  The heart begins to beat spontaneously.  The aortic cross-clamp is removed after a total cross-clamp time of 78 minutes.  The patient was rewarmed to greater than 37 degrees centigrade temperature. The heart is defibrillated into normal sinus rhythm.  All three proximal saphenous ein anastomoses are performed directly to the ascending aorta under a separate partial occlusion clamp.  All proximal and distal anastomoses are inspected for hemostasis and appropriate graft orientation.  Epicardial pacing wires are affixed to the right ventricular outflow tract and to the right atrial  appendage.  The patient was weaned from cardiopulmonary bypass without difficulty.  The patients rhythm at separation from bypass was normal sinus rhythm.  No inotropic support was required. Total cardiopulmonary bypass time for the operation is 140 minutes.  The venous and arterial cannulae are removed uneventfully.  Protamine is administered to reverse the anticoagulation.  The mediastinum and the left chest are irrigated with saline solution containing vancomycin.  Meticulous surgical hemostasis was ascertained.  The mediastinum and the left chest are drained with three chest tubes placed through separate stab incisions inferiorly.  The median sternotomy is closed in a routine fashion.  Both lower extremity incisions are closed in multiple layers n the routine fashion.  All skin incisions are closed with subcuticular skin closures.  The patient tolerated the procedure well and is transported to the surgical intensive care unit in stable condition.  There were no intraoperative complications.  All sponge, instrument, and needle counts are verified correct t completion of the operation.  No autologous blood products were administered. DD:  12/23/99 TD:  12/25/99 Job: [id number]

## 2011-01-13 NOTE — Cardiovascular Report (Signed)
[place]  Patient:    FELIMON, CEPHAS Visit Number: [id number] MRN: [id number]          Service Type: MED Location: [phone] Attending Physician:  [name] Dictated by:   Allene Dillon, M.D. [facility] Proc. Date: 06/04/01 Admit Date:  06/03/2001 Discharge Date: 06/05/2001   CC:         [name], M.D.  Cristopher Estimable. Lattie Haw, M.D. [facility]  Cardiac Catheterization Lab   Cardiac Catheterization  PROCEDURE PERFORMED:  Left heart catheterization with coronary angiography, bypass graft angiography, and left ventriculography.  INDICATIONS:  Mr. Cottman is a 60 year old male with a history of coronary artery disease.  He underwent multiple stent placement in the right coronary artery followed by coronary artery bypass grafting in April 2001.  He was admitted to the hospital yesterday with prolonged chest pain and ruled out for myocardial infarction.  Because of the ischemic nature of the pain, he was referred for cardiac catheterization.  PROCEDURAL NOTE:  A 6-French sheath was placed in the right femoral artery.  CATHETERS UTILIZED:  Included a 6-French JL4, JR4, LCD, and angled pigtail.  CONTRAST:  Omnipaque.  COMPLICATIONS:  None.  RESULTS:  Hemodynamics:  Left ventricular pressure 110/18.  Aortic pressure 110/75. There was no aortic valve gradient.  Left ventriculogram:  There was severe hypokinesis of the inferior wall, mild hypokinesis of the anteroapical wall.  Ejection fraction was calculated at 49%.  There was no mitral regurgitation.  Coronary arteriography (right dominant): 1. The left main is normal. 2. The left anterior descending artery has a tubular 70% stenosis in the mid    vessel between the first and second diagonal branches.  In the mid vessel    just beyond the second diagonal branch of the LAD, it is 100% occluded.    The first diagonal itself is normal in size and has a 70% stenosis    proximally. 3.  The left circumflex is 100% occluded in the proximal to mid vessel.  It    gives rise to a small ramus intermedius prior to its occlusion. 4. The right coronary artery is 100% occluded in a proximal vessel just    proximal to the first stent. 5. The left internal mammary artery to the distal LAD is patent throughout its    course.  This fills the mid and distal LAD including the small third    diagonal branch. 6. The saphenous vein graft to the first diagonal branch is patent.  This was    labeled as ramus intermedius in the operative report; however, it does    appear to be arising from the very proximal LAD. 7. There is a sequential saphenous vein graft to an obtuse marginal branch and    a large second right posterolateral branch.  This graft is patent    throughout its course; however, the obtuse marginal itself is 100%    occluded just after the vein graft insertion.  The distal limb of this vein    graft fills a very large second posterolateral branch.  Proximal to this    second posterolateral branch in the distal right coronary artery between    the first and second posterolateral branch is a long 90% stenosis. 8. The sequential saphenous vein graft to the posterior descending artery and    first right posterolateral branch is patent; however, there is slow flow in    this graft.  The posterior descending artery and first posterolateral  branch themselves are small to normal in size.  There is no obvious    obstructive disease in this graft; however, there appears to be slow flow    in the graft probably secondary to poor vessel runoff due to the small    distal vessels.  There is layering of contrast in the distal limb of this    graft between the posterior descending artery and posterolateral branch    and the presence of laminated thrombus cannot be ruled out; however, again,    there are no clearly obstructive lesions in this graft.  IMPRESSIONS: 1. Mildly decreased left  ventricular systolic function. 2. Native three-vessel coronary artery disease. 3. Status post coronary artery bypass grafting as described with all grafts    remaining patent.  The obtuse marginal itself beyond the vein graft    insertion is 100% occluded; however, this was a small vessel.  As    described, there is slow flow in the sequential vein graft to the posterior    descending artery and posterolateral branch which appears to be secondary    to poor distal runoff.  There do not appear to be any mechanical    obstructions in this vein graft.  RECOMMENDATIONS:  Medical therapy including long-term Plavix. Dictated by:   Allene Dillon, M.D. [place] Attending Physician:  [name] DD:  06/04/01 TD:  06/05/01 Job: [phone] [id number]

## 2011-01-13 NOTE — Discharge Summary (Signed)
[place]. [facility]  Patient:    [name] [name], [name] [name] Visit Number: [id number] MRN: [id number]          Service Type: MED Location: [phone] Attending Physician:  [name] Dictated by:   [name], R.N. Admit Date:  06/03/2001 Discharge Date: 06/05/2001   CC:         [name], M.D., [name], [name]   Discharge Summary  DISCHARGE DIAGNOSES:  1. Coronary artery disease, with prior coronary artery bypass grafting and     stent.  2. Hyperlipoproteinemia.  3. Obesity.  4. History of tobacco use.  5. Follow-up noncompliance.  HISTORY OF PRESENT ILLNESS: The patient is a delightful 60 year old obese white male, with a multiple year history of coronary artery disease.  He underwent CABG x 6 in April 2001, and has had recurrent episodes of chest pain with pressure and radiation to the left arm and foot, relieved by nitroglycerin, over the past two months.  He presented to the emergency room, where he was admitted for further evaluation and treatment.  HOSPITAL COURSE: On admission to the emergency room he rated his pain as 3/10, with maximum preadmission severity 6/10.  He was stabilized with nitroglycerin and heparin.  His admission laboratories were followed to be as follows:  WBC 6.6, hemoglobin 15.3, hematocrit 43.5.  Glucose 223.  PT was found to be 13.4, INR was 1.0.  Baseline PTT was 31, TSH was 0.8.  Chemistries were as follows: Sodium 143, potassium 3.6, chloride 106.  BUN was 8 and creatinine was 0.8. The patients glucose was high at 184, and ABGs revealed an extremely mild alkalosis, with a pH of 7.42, a pCO2 of 39.6, and bicarbonate of 26.0.  His serial cardiac enzymes were found to be within normal limits.  CK was 24 the day of admission and remained so for the next two days.  Troponin I remained at 0.01 throughout hospitalization.  EKG on admission displayed normal sinus rhythm with some nonspecific ST changes.  After  evaluation and discussion with the patient it was elected to take him urgently to the catheterization laboratory, where angiogram was performed. Catheterization revealed mild decrease in left ventricular ejection fraction, native three-vessel coronary artery disease, and widespread patency of all grafts, with some distal OM occlusion past the distal anastomotic site.  It was elected to manage the patients coronary artery disease medically with Plavix, beta-blocker, and ACE inhibition.  The patient was returned to the floor in stable condition and he continued to improve.  On the day of discharge he reported no complaints of chest pain or pressure.  His vital signs were stable and telemetry revealed normal sinus rhythm with rate in the 60s.  His blood pressure remained between 102-110.  DISCHARGE INSTRUCTIONS: The patient was given a list of discharge instructions which included avoidance of strenuous activity for two days, adherence to a low-fat/low-salt/low-cholesterol diet, and he knows to contact us if his groin wound becomes hard or painful.  FOLLOW-UP: An appointment was made for him for follow-up with the P.A. on June 21, 2001 at 11:30 a.m., but the patient indicates that he will not keep this appointment and will schedule his own follow-up with Dr. Lattie Haw at the [facility] clinic.  DISCHARGE MEDICATIONS:  1. Zocor 40 mg one q.d.  2. Aspirin 325 mg one q.d.  3. Lopressor 50 mg 1/2 tablet b.i.d., with frequent blood pressure     checks to be performed by the patient and recorded  for physician     review.  4. Ramipril 5 mg one q.d.  5. Nitro spray as directed.  DISCHARGE CONDITION: Stable.  DISPOSITION: The patient is discharged home with his wife and will follow up with Dr. Lattie Haw as noted.  He agrees to present to the emergency department for any change or increase in chest symptoms. Dictated by:   [name], R.N. Attending Physician:  [name] DD:  06/05/01 TD:  06/05/01 Job: [phone] [id number]

## 2011-02-04 ENCOUNTER — Emergency Department (HOSPITAL_COMMUNITY)
Admission: EM | Admit: 2011-02-04 | Discharge: 2011-02-04 | Disposition: A | Discharge status: Home / Self Care | Payer: Managed Care, Other (non HMO) | Attending: Emergency Medicine | Admitting: Emergency Medicine

## 2011-02-04 ENCOUNTER — Emergency Department (HOSPITAL_COMMUNITY): Payer: Managed Care, Other (non HMO)

## 2011-02-04 DIAGNOSIS — I252 Old myocardial infarction: Secondary | ICD-10-CM | POA: Insufficient documentation

## 2011-02-04 DIAGNOSIS — W010XXA Fall on same level from slipping, tripping and stumbling without subsequent striking against object, initial encounter: Secondary | ICD-10-CM | POA: Insufficient documentation

## 2011-02-04 DIAGNOSIS — M25529 Pain in unspecified elbow: Secondary | ICD-10-CM | POA: Insufficient documentation

## 2011-02-04 DIAGNOSIS — Z951 Presence of aortocoronary bypass graft: Secondary | ICD-10-CM | POA: Insufficient documentation

## 2011-02-04 DIAGNOSIS — Y9229 Other specified public building as the place of occurrence of the external cause: Secondary | ICD-10-CM | POA: Insufficient documentation

## 2011-02-04 DIAGNOSIS — M25559 Pain in unspecified hip: Secondary | ICD-10-CM | POA: Insufficient documentation

## 2011-02-04 DIAGNOSIS — T07XXXA Unspecified multiple injuries, initial encounter: Secondary | ICD-10-CM | POA: Insufficient documentation

## 2011-02-04 DIAGNOSIS — M25569 Pain in unspecified knee: Secondary | ICD-10-CM | POA: Insufficient documentation

## 2011-02-04 DIAGNOSIS — I2510 Atherosclerotic heart disease of native coronary artery without angina pectoris: Secondary | ICD-10-CM | POA: Insufficient documentation

## 2011-02-27 ENCOUNTER — Other Ambulatory Visit: Payer: Self-pay | Admitting: Cardiology

## 2011-02-27 MED ORDER — LISINOPRIL 5 MG PO TABS: 5.0000 mg | ORAL_TABLET | Freq: Every day | ORAL | Status: DC

## 2011-02-27 NOTE — Telephone Encounter (Signed)
[place] patient.   

## 2011-02-27 NOTE — Telephone Encounter (Signed)
..   Requested Prescriptions   Pending Prescriptions Disp Refills  . lisinopril (PRINIVIL,ZESTRIL) 5 MG tablet 30 tablet 11    Sig: Take 1 tablet (5 mg total) by mouth daily.   Pt has no schedule appt since 2009.

## 2011-03-01 ENCOUNTER — Other Ambulatory Visit: Payer: Self-pay | Admitting: *Deleted

## 2011-03-02 MED ORDER — LISINOPRIL 5 MG PO TABS: 5.0000 mg | ORAL_TABLET | Freq: Every day | ORAL | Status: DC

## 2011-03-02 NOTE — Progress Notes (Signed)
Addended by: Johnnette Barrios on: 03/02/2011 01:39 PM   Modules accepted: Orders

## 2011-05-25 LAB — BASIC METABOLIC PANEL
BUN: 7
BUN: 9
BUN: 9
CO2: 28
Calcium: 7.9 — ABNORMAL LOW
Calcium: 8.2 — ABNORMAL LOW
Calcium: 8.2 — ABNORMAL LOW
Calcium: 8.3 — ABNORMAL LOW
Calcium: 8.4
Creatinine, Ser: 0.67
Creatinine, Ser: 0.7
Creatinine, Ser: 0.82
GFR calc Af Amer: >60
GFR calc non Af Amer: >60
GFR calc non Af Amer: >60
GFR calc non Af Amer: >60
GFR calc non Af Amer: >60
Glucose, Bld: 107 — ABNORMAL HIGH
Glucose, Bld: 110 — ABNORMAL HIGH
Glucose, Bld: 125 — ABNORMAL HIGH
Glucose, Bld: 142 — ABNORMAL HIGH
Glucose, Bld: 98
Potassium: 3.4 — ABNORMAL LOW
Sodium: 139
Sodium: 140

## 2011-05-25 LAB — CBC
HCT: 38.2 — ABNORMAL LOW
HCT: 38.5 — ABNORMAL LOW
HCT: 39.5
HCT: 44.7
Hemoglobin: 13.3
Hemoglobin: 13.4
Hemoglobin: 14.1
Hemoglobin: 15.5
MCHC: 34.5
MCHC: 35
MCV: 90.5
MCV: 90.8
MCV: 91.3
Platelets: 189
RBC: 4.38
RBC: 4.94
RDW: 13.2
RDW: 13.2
RDW: 13.4
WBC: 8.2

## 2011-05-25 LAB — COMPREHENSIVE METABOLIC PANEL
BUN: 8
Calcium: 8.5
Glucose, Bld: 98
Sodium: 141
Total Protein: 7.1

## 2011-05-25 LAB — LIPID PANEL
Total CHOL/HDL Ratio: 6.6
VLDL: 34

## 2011-05-25 LAB — DIFFERENTIAL
Eosinophils Absolute: 0.2
Eosinophils Relative: 2
Lymphocytes Relative: 16
Lymphs Abs: 2
Monocytes Absolute: 1
Monocytes Absolute: 1.1 — ABNORMAL HIGH
Monocytes Relative: 10
Monocytes Relative: 13 — ABNORMAL HIGH
Neutro Abs: 7.2
Neutrophils Relative %: 60

## 2011-05-25 LAB — PROTIME-INR
INR: 1.1
INR: 1.1
Prothrombin Time: 14.4
Prothrombin Time: 14.5

## 2011-05-25 LAB — POCT I-STAT, CHEM 8
Creatinine, Ser: 0.9
Glucose, Bld: 90
Hemoglobin: 15.6
Potassium: 3.8

## 2011-05-25 LAB — HEMOGLOBIN A1C: Hgb A1c MFr Bld: 5.9

## 2011-05-25 LAB — CK TOTAL AND CKMB (NOT AT ARMC)
CK, MB: 3.1
CK, MB: 4.2 — ABNORMAL HIGH
Total CK: 72

## 2011-05-25 LAB — CARDIAC PANEL(CRET KIN+CKTOT+MB+TROPI)
CK, MB: 14.4 — ABNORMAL HIGH
CK, MB: 7.4 — ABNORMAL HIGH
Relative Index: 13.2 — ABNORMAL HIGH

## 2011-05-25 LAB — D-DIMER, QUANTITATIVE: D-Dimer, Quant: 0.43

## 2011-05-25 LAB — POCT CARDIAC MARKERS: CKMB, poc: 2.2

## 2012-03-28 ENCOUNTER — Other Ambulatory Visit: Payer: Self-pay | Admitting: *Deleted

## 2012-03-28 MED ORDER — NITROGLYCERIN 0.4 MG SL SUBL: 0.4000 mg | SUBLINGUAL_TABLET | SUBLINGUAL | Status: DC | PRN

## 2013-05-06 ENCOUNTER — Ambulatory Visit (INDEPENDENT_AMBULATORY_CARE_PROVIDER_SITE_OTHER): Payer: Managed Care, Other (non HMO) | Admitting: Family Medicine

## 2013-05-06 ENCOUNTER — Encounter: Payer: Self-pay | Admitting: Family Medicine

## 2013-05-06 VITALS — BP 128/82 | Ht 71.5 in | Wt 305.6 lb

## 2013-05-06 DIAGNOSIS — Z125 Encounter for screening for malignant neoplasm of prostate: Secondary | ICD-10-CM

## 2013-05-06 DIAGNOSIS — E785 Hyperlipidemia, unspecified: Secondary | ICD-10-CM

## 2013-05-06 DIAGNOSIS — R739 Hyperglycemia, unspecified: Secondary | ICD-10-CM

## 2013-05-06 DIAGNOSIS — Z79899 Other long term (current) drug therapy: Secondary | ICD-10-CM

## 2013-05-06 DIAGNOSIS — L989 Disorder of the skin and subcutaneous tissue, unspecified: Secondary | ICD-10-CM

## 2013-05-06 DIAGNOSIS — R7309 Other abnormal glucose: Secondary | ICD-10-CM

## 2013-05-06 NOTE — Progress Notes (Signed)
  Subjective:    Patient ID: [name] [name], male    DOB: 03-11-1951, 63 y.o.   MRN: [id number]  HPI Patient arrives because he has noticed sudden change in the appearance of a mole on his back. Patient also wants his handicap place card filled out. Patient also has noticed that he is having a hard time focusing in his left eye only. Pt denies chest pain, hx heart disease Pt states he saw urology 2 years ago and they told him his prostate was "nl"   Review of Systems See  above    Objective:   Physical Exam Enlarged tender area , soft growth, pt relates rapid growth and bleeding  With consent, discussed sterile approach, eliptical removal with 3 sutures       Assessment & Plan:  Removal of growth, complete excision  Labs ordered, pt not taking good care of himself, we will review labs and restart meds on f/u nxt week

## 2013-05-08 LAB — HEPATIC FUNCTION PANEL
AST: 25 U/L (ref 0–37)
Alkaline Phosphatase: 77 U/L (ref 39–117)
Bilirubin, Direct: 0.1 mg/dL (ref 0.0–0.3)
Indirect Bilirubin: 0.5 mg/dL (ref 0.0–0.9)
Total Bilirubin: 0.6 mg/dL (ref 0.3–1.2)

## 2013-05-08 LAB — BASIC METABOLIC PANEL
CO2: 30 mEq/L (ref 19–32)
Calcium: 9.4 mg/dL (ref 8.4–10.5)
Chloride: 100 mEq/L (ref 96–112)
Creat: 0.87 mg/dL (ref 0.50–1.35)
Glucose, Bld: 125 mg/dL — ABNORMAL HIGH (ref 70–99)

## 2013-05-14 ENCOUNTER — Ambulatory Visit (INDEPENDENT_AMBULATORY_CARE_PROVIDER_SITE_OTHER): Payer: Managed Care, Other (non HMO) | Admitting: Family Medicine

## 2013-05-14 ENCOUNTER — Encounter: Payer: Self-pay | Admitting: Family Medicine

## 2013-05-14 VITALS — BP 132/88 | Ht 73.0 in | Wt 301.8 lb

## 2013-05-14 DIAGNOSIS — I2510 Atherosclerotic heart disease of native coronary artery without angina pectoris: Secondary | ICD-10-CM | POA: Insufficient documentation

## 2013-05-14 DIAGNOSIS — R972 Elevated prostate specific antigen [PSA]: Secondary | ICD-10-CM | POA: Insufficient documentation

## 2013-05-14 DIAGNOSIS — R7309 Other abnormal glucose: Secondary | ICD-10-CM

## 2013-05-14 DIAGNOSIS — E785 Hyperlipidemia, unspecified: Secondary | ICD-10-CM

## 2013-05-14 DIAGNOSIS — R7303 Prediabetes: Secondary | ICD-10-CM | POA: Insufficient documentation

## 2013-05-14 DIAGNOSIS — I25810 Atherosclerosis of coronary artery bypass graft(s) without angina pectoris: Secondary | ICD-10-CM

## 2013-05-14 DIAGNOSIS — Z79899 Other long term (current) drug therapy: Secondary | ICD-10-CM

## 2013-05-14 MED ORDER — NITROGLYCERIN 0.4 MG SL SUBL: 0.4000 mg | SUBLINGUAL_TABLET | SUBLINGUAL | Status: DC | PRN

## 2013-05-14 MED ORDER — ROSUVASTATIN CALCIUM 40 MG PO TABS: 40.0000 mg | ORAL_TABLET | Freq: Every day | ORAL | Status: DC

## 2013-05-14 MED ORDER — CLOPIDOGREL BISULFATE 75 MG PO TABS: 75.0000 mg | ORAL_TABLET | Freq: Every day | ORAL | Status: DC

## 2013-05-14 MED ORDER — METOPROLOL TARTRATE 25 MG PO TABS: 25.0000 mg | ORAL_TABLET | Freq: Two times a day (BID) | ORAL | Status: DC

## 2013-05-14 NOTE — Progress Notes (Signed)
  Subjective:    Patient ID: [name] [name], male    DOB: 02/23/1951, 62 y.o.   MRN: [id number]  HPIHere for a follow up on bloodwork.   Needs to take out sutures on back.   Discuss heart medicine that heart doctors stopped.  This gentleman is a very kind and interesting. He states he is trying to watch his diet is trying to cut back on eating and lose weight. He does do some walking but he denies any vigorous exercise he denies any chest tightness pressure pain gets a little short of breath if he pushes himself too much he has had cardiac history and he states a while back cardiac doctors for some reason told him they would not refill his medicines. Patient does not followup here on a regular basis. 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 He has a history of heart disease, hyperlipidemia, hypertension. Patient also has a remote history elevated PSA that he states was checked out by a specialist several years go and told that it was normal. Family history noncontributory Social doesn't smoke   Review of Systems    denies chest tightness pressure pain shortness breath rectal bleeding hematuria. Objective:   Physical Exam His lungs are clear hearts regular pulse normal BP good abdomen soft no masses felt extremities no edema skin warm dry       Assessment & Plan:  #1 elevated PSA-recheck PSA in one month's time if still elevated referral to urology patient understands the importance of doing this #2 hyperlipidemia restart medicine check lipid profile in 4 weeks' time #3 heart disease-nitroglycerin refill plus also told to restart Plavix daily baby aspirin daily #4 patient was advised to eat a low starch diet low sugar diet exercise on a regular basis. His A1c slightly elevated at 6.3 patient opts not to start medicine currently if the A1c does not dramatically come down next that would be consideration for  metformin #5 sutures were removed from where he had neurofibroma removed no sign of any infection Patient followup in 3 months office visit. Check A1c at that time.

## 2013-05-14 NOTE — Patient Instructions (Signed)
81 mg aspirin one daily Plavix one daily Crestor 40 one daily Metoprolol 25 mg , one tablet twice daily  We will check your cholesterol again in 4 weeks  BP 134 /84  Recheck here in 3 months  Continue to eat healthy and stay active, continue to lose weight  PSA is slightly elevated this needs rechecked in 1 month if still elevated then I will refer you to the urologist/  DASH Diet The DASH diet stands for "Dietary Approaches to Stop Hypertension." It is a healthy eating plan that has been shown to reduce high blood pressure (hypertension) in as little as 14 days, while also possibly providing other significant health benefits. These other health benefits include reducing the risk of breast cancer after menopause and reducing the risk of type 2 diabetes, heart disease, colon cancer, and stroke. Health benefits also include weight loss and slowing kidney failure in patients with chronic kidney disease.  DIET GUIDELINES  Limit salt (sodium). Your diet should contain less than 1500 mg of sodium daily.  Limit refined or processed carbohydrates. Your diet should include mostly whole grains. Desserts and added sugars should be used sparingly.  Include small amounts of heart-healthy fats. These types of fats include nuts, oils, and tub margarine. Limit saturated and trans fats. These fats have been shown to be harmful in the body. CHOOSING FOODS  The following food groups are based on a 2000 calorie diet. See your Registered Dietitian for individual calorie needs. Grains and Grain Products (6 to 8 servings daily)  Eat More Often: Whole-wheat bread, brown rice, whole-grain or wheat pasta, quinoa, popcorn without added fat or salt (air popped).  Eat Less Often: White bread, white pasta, white rice, cornbread. Vegetables (4 to 5 servings daily)  Eat More Often: Fresh, frozen, and canned vegetables. Vegetables may be raw, steamed, roasted, or grilled with a minimal amount of fat.  Eat Less  Often/Avoid: Creamed or fried vegetables. Vegetables in a cheese sauce. Fruit (4 to 5 servings daily)  Eat More Often: All fresh, canned (in natural juice), or frozen fruits. Dried fruits without added sugar. One hundred percent fruit juice ( cup [237 mL] daily).  Eat Less Often: Dried fruits with added sugar. Canned fruit in light or heavy syrup. [employer], Fish, and Poultry (2 servings or less daily. One serving is 3 to 4 oz [85-114 g]).  Eat More Often: Ninety percent or leaner ground beef, tenderloin, sirloin. Round cuts of beef, chicken breast, [place] breast. All fish. Grill, bake, or broil your meat. Nothing should be fried.  Eat Less Often/Avoid: Fatty cuts of meat, [place], or chicken leg, thigh, or wing. Fried cuts of meat or fish. Dairy (2 to 3 servings)  Eat More Often: Low-fat or fat-free milk, low-fat plain or light yogurt, reduced-fat or part-skim cheese.  Eat Less Often/Avoid: Milk (whole, 2%).Whole milk yogurt. Full-fat cheeses. Nuts, Seeds, and Legumes (4 to 5 servings per week)  Eat More Often: All without added salt.  Eat Less Often/Avoid: Salted nuts and seeds, canned beans with added salt. Fats and Sweets (limited)  Eat More Often: Vegetable oils, tub margarines without trans fats, sugar-free gelatin. Mayonnaise and salad dressings.  Eat Less Often/Avoid: Coconut oils, palm oils, butter, stick margarine, cream, half and half, cookies, candy, pie. FOR MORE INFORMATION The Dash Diet Eating Plan: www.dashdiet.org Document Released: 08/03/2011 Document Revised: 11/06/2011 Document Reviewed: 08/03/2011 [facility] Patient Information 2014 [place], [place].

## 2013-08-14 ENCOUNTER — Other Ambulatory Visit: Payer: Self-pay | Admitting: Family Medicine

## 2013-08-14 ENCOUNTER — Encounter: Payer: Self-pay | Admitting: Family Medicine

## 2013-08-14 ENCOUNTER — Ambulatory Visit (INDEPENDENT_AMBULATORY_CARE_PROVIDER_SITE_OTHER): Payer: Managed Care, Other (non HMO) | Admitting: Family Medicine

## 2013-08-14 VITALS — BP 122/84 | Ht 72.0 in | Wt 294.8 lb

## 2013-08-14 DIAGNOSIS — R7309 Other abnormal glucose: Secondary | ICD-10-CM

## 2013-08-14 DIAGNOSIS — M25571 Pain in right ankle and joints of right foot: Secondary | ICD-10-CM

## 2013-08-14 DIAGNOSIS — R972 Elevated prostate specific antigen [PSA]: Secondary | ICD-10-CM

## 2013-08-14 DIAGNOSIS — E785 Hyperlipidemia, unspecified: Secondary | ICD-10-CM

## 2013-08-14 DIAGNOSIS — M25579 Pain in unspecified ankle and joints of unspecified foot: Secondary | ICD-10-CM

## 2013-08-14 DIAGNOSIS — R7303 Prediabetes: Secondary | ICD-10-CM

## 2013-08-14 LAB — LIPID PANEL
HDL: 36 mg/dL — ABNORMAL LOW (ref >39)
LDL Cholesterol: 51 mg/dL (ref 0–99)
Triglycerides: 143 mg/dL (ref <150)

## 2013-08-14 LAB — HEPATIC FUNCTION PANEL
ALT: 25 U/L (ref 0–53)
Albumin: 4.2 g/dL (ref 3.5–5.2)
Alkaline Phosphatase: 78 U/L (ref 39–117)
Total Protein: 7.3 g/dL (ref 6.0–8.3)

## 2013-08-14 LAB — BASIC METABOLIC PANEL
CO2: 29 mEq/L (ref 19–32)
Calcium: 9.1 mg/dL (ref 8.4–10.5)
Chloride: 104 mEq/L (ref 96–112)
Creat: 0.81 mg/dL (ref 0.50–1.35)
Glucose, Bld: 121 mg/dL — ABNORMAL HIGH (ref 70–99)

## 2013-08-14 LAB — PSA, TOTAL AND FREE
PSA, Free Pct: 20 % — ABNORMAL LOW (ref >25)
PSA: 6.57 ng/mL — ABNORMAL HIGH (ref <=4.00)

## 2013-08-14 MED ORDER — HYDROCODONE-ACETAMINOPHEN 10-325 MG PO TABS: 1.0000 | ORAL_TABLET | ORAL | Status: DC | PRN

## 2013-08-14 MED ORDER — METFORMIN HCL ER (MOD) 500 MG PO TB24: 500.0000 mg | ORAL_TABLET | Freq: Every day | ORAL | Status: DC

## 2013-08-14 NOTE — Progress Notes (Signed)
   Subjective:    Patient ID: [name] [name], male    DOB: 1951-01-07, 62 y.o.   MRN: [id number]  HPI Patient is here today for 3 month check up. This patient is very nice but he has quite a few items that he covers within a short span of time. He has been trying to watch his diet but he has not really lost any weight he states he's always a big guy. He has a history of prediabetes and a family history of diabetes His A1C is 6.5.   He has no concerns, except for his BP. His blood pressure is elevated at times but other times on recheck it is good. He believes it is because he can consciously will his blood pressure up and down. I did spend time with him talking about walking for exercise watching diet try to bring his weight down.  Hydrocodone needed for foot and back. Patient fractured his foot when he is 13 the right ankle since then it causes pain he states he uses hydrocodone when necessary it was very difficult for him to judge how much hydrocodone he uses. But he denies abusing it or overusing anywhere between 2 and 6 per week Past medical history family history social history review Review of Systems Denies chest tightness pressure pain shortness breath swelling in the legs rectal bleeding hematuria.   he does relate soreness on his chest that comes and goes from previous surgery he states a seatbelt often causes pain and discomfort Objective:   Physical Exam Lungs clear hearts regular pulse normal extremities trace edema skin warm dry neurologic grossly normal diabetic foot exam was completed       Assessment & Plan:  Patient was advised to do her yearly eye exam Start metformin 500 mg extended release once a day, recheck A1c in 3 months follow a low starch diet  Blood pressure acceptable currently  Patient with elevated PSA this was present several years ago saw [facility] patient was advised to repeat his PSA never did get it done he was advised again today to do a papers  were given  Right ankle and back pain chronic pain hydrocodone when necessary prescription given followup 3 months

## 2013-09-26 ENCOUNTER — Other Ambulatory Visit: Payer: Self-pay | Admitting: Urology

## 2013-09-26 ENCOUNTER — Ambulatory Visit (INDEPENDENT_AMBULATORY_CARE_PROVIDER_SITE_OTHER): Payer: BC Managed Care – PPO | Admitting: Urology

## 2013-09-26 DIAGNOSIS — R972 Elevated prostate specific antigen [PSA]: Secondary | ICD-10-CM

## 2013-09-26 DIAGNOSIS — N401 Enlarged prostate with lower urinary tract symptoms: Secondary | ICD-10-CM

## 2013-10-28 ENCOUNTER — Encounter: Payer: Self-pay | Admitting: Nurse Practitioner

## 2013-10-28 ENCOUNTER — Ambulatory Visit (HOSPITAL_COMMUNITY)
Admission: RE | Admit: 2013-10-28 | Discharge: 2013-10-28 | Disposition: A | Discharge status: Home / Self Care | Payer: BC Managed Care – PPO | Source: Ambulatory Visit | Attending: Nurse Practitioner | Admitting: Nurse Practitioner

## 2013-10-28 ENCOUNTER — Ambulatory Visit (HOSPITAL_COMMUNITY)
Admission: RE | Admit: 2013-10-28 | Discharge: 2013-10-28 | Disposition: A | Discharge status: Home / Self Care | Payer: BC Managed Care – PPO | Source: Ambulatory Visit | Attending: Family Medicine | Admitting: Family Medicine

## 2013-10-28 ENCOUNTER — Telehealth: Payer: Self-pay | Admitting: Family Medicine

## 2013-10-28 ENCOUNTER — Ambulatory Visit (INDEPENDENT_AMBULATORY_CARE_PROVIDER_SITE_OTHER): Payer: BC Managed Care – PPO | Admitting: Nurse Practitioner

## 2013-10-28 VITALS — BP 140/86 | Ht 72.0 in | Wt 307.0 lb

## 2013-10-28 DIAGNOSIS — I517 Cardiomegaly: Secondary | ICD-10-CM | POA: Insufficient documentation

## 2013-10-28 DIAGNOSIS — R0602 Shortness of breath: Secondary | ICD-10-CM

## 2013-10-28 DIAGNOSIS — I509 Heart failure, unspecified: Secondary | ICD-10-CM | POA: Insufficient documentation

## 2013-10-28 DIAGNOSIS — I25810 Atherosclerosis of coronary artery bypass graft(s) without angina pectoris: Secondary | ICD-10-CM

## 2013-10-28 DIAGNOSIS — J90 Pleural effusion, not elsewhere classified: Secondary | ICD-10-CM | POA: Insufficient documentation

## 2013-10-28 DIAGNOSIS — J811 Chronic pulmonary edema: Secondary | ICD-10-CM | POA: Insufficient documentation

## 2013-10-28 DIAGNOSIS — R609 Edema, unspecified: Secondary | ICD-10-CM

## 2013-10-28 DIAGNOSIS — J300 Vasomotor rhinitis: Secondary | ICD-10-CM

## 2013-10-28 DIAGNOSIS — Z87891 Personal history of nicotine dependence: Secondary | ICD-10-CM | POA: Insufficient documentation

## 2013-10-28 DIAGNOSIS — J309 Allergic rhinitis, unspecified: Secondary | ICD-10-CM

## 2013-10-28 LAB — CBC WITH DIFFERENTIAL/PLATELET
Basophils Absolute: 0 10*3/uL (ref 0.0–0.1)
Basophils Relative: 0 % (ref 0–1)
Eosinophils Absolute: 0.1 10*3/uL (ref 0.0–0.7)
Eosinophils Relative: 1 % (ref 0–5)
HCT: 46.5 % (ref 39.0–52.0)
HEMOGLOBIN: 15.5 g/dL (ref 13.0–17.0)
LYMPHS ABS: 1.6 10*3/uL (ref 0.7–4.0)
Lymphocytes Relative: 20 % (ref 12–46)
MCH: 31.4 pg (ref 26.0–34.0)
MCHC: 33.3 g/dL (ref 30.0–36.0)
MCV: 94.1 fL (ref 78.0–100.0)
MONOS PCT: 11 % (ref 3–12)
Monocytes Absolute: 0.9 10*3/uL (ref 0.1–1.0)
NEUTROS ABS: 5.6 10*3/uL (ref 1.7–7.7)
NEUTROS PCT: 68 % (ref 43–77)
PLATELETS: 254 10*3/uL (ref 150–400)
RBC: 4.94 MIL/uL (ref 4.22–5.81)
RDW: 14.3 % (ref 11.5–15.5)
WBC: 8.2 10*3/uL (ref 4.0–10.5)

## 2013-10-28 LAB — PRO B NATRIURETIC PEPTIDE: Pro B Natriuretic peptide (BNP): 1521 pg/mL — ABNORMAL HIGH (ref <126)

## 2013-10-28 MED ORDER — AMOXICILLIN-POT CLAVULANATE 875-125 MG PO TABS: 1.0000 | ORAL_TABLET | Freq: Two times a day (BID) | ORAL | Status: DC

## 2013-10-28 MED ORDER — FUROSEMIDE 40 MG PO TABS: ORAL_TABLET | ORAL | Status: DC

## 2013-10-28 MED ORDER — LISINOPRIL 5 MG PO TABS: 5.0000 mg | ORAL_TABLET | Freq: Every day | ORAL | Status: DC

## 2013-10-28 NOTE — Telephone Encounter (Signed)
Patient forgot to tell you in the OV today that everytime an episode starts, he gets a bad headache on the left side. He would like for you to call him back.

## 2013-10-28 NOTE — Progress Notes (Signed)
Patients oxygen level on room air was 96%.

## 2013-10-28 NOTE — Telephone Encounter (Signed)
Discussed with patient 3/5

## 2013-10-29 ENCOUNTER — Other Ambulatory Visit: Payer: Self-pay | Admitting: Nurse Practitioner

## 2013-10-29 ENCOUNTER — Encounter: Payer: Self-pay | Admitting: Nurse Practitioner

## 2013-10-29 DIAGNOSIS — I509 Heart failure, unspecified: Secondary | ICD-10-CM

## 2013-10-29 DIAGNOSIS — I5042 Chronic combined systolic (congestive) and diastolic (congestive) heart failure: Secondary | ICD-10-CM | POA: Insufficient documentation

## 2013-10-29 NOTE — Progress Notes (Signed)
Patient notified and verbalized understanding of the test results. No further questions. 

## 2013-10-29 NOTE — Progress Notes (Signed)
Subjective:  Presents complaints of shortness of breath that began about a week and a half ago. Has progressively gotten worse. Notices it when he is bending over. Makes contradictory statements, states he had no problem walking in from the parking lot but on his visit sheet says he has to stop and rest when he walks short distances. Worse when he gets overheated. States breathing in cold air helps. No fever or cough. Has had trouble breathing and sleeping at nighttime, last night he elevated his head with his pillow and slept fine. Denies any edema. No wheezing. Has a history of CAD. Tried a nitroglycerin pill which did not help. No chest pain or tightness/ischemic type pain. Has not seen a cardiologist in 5-6 years. Is scheduled for a prostate biopsy in 3 days. No sore throat or ear pain. Mild head congestion. When patient was called several hours later to discuss his chest x-ray results, he mentioned at that time that he had a left-sided facial area headache particularly around the left frontal area. Producing green mucus at times. Headache worse with bending. Wife is present with him today.  Objective:   BP 140/86  Ht 6' (1.829 m)  Wt 307 lb (139.254 kg)  BMI 41.63 kg/m2 NAD. Alert, oriented. TMs mild clear effusion, no erythema. Pharynx injected with clear PND noted. Neck supple with mild soft anterior adenopathy. Lungs breath sounds distant but clear. No wheezing. Shortness of breath noted with talking. Lower extremities 1+ pitting edema. EKG reviewed with Dr. Nicki Reaper. Chest x-ray shows mild to moderate CHF with increasing cardiomegaly and interstitial pulmonary edema. O2 sat room air 96%. Labs were pending at that time. Results reviewed with Dr. Nicki Reaper.   Assessment:SOB (shortness of breath) - Plan: PR ELECTROCARDIOGRAM, COMPLETE, CBC with Differential, Brain natriuretic peptide, DG Chest 2 View, Pulse oximetry (single)  Peripheral edema - Plan: Brain natriuretic peptide  Vasomotor rhinitis  CAD  (coronary artery disease) of artery bypass graft - Plan: Brain natriuretic peptide  Plan: Meds ordered this encounter  Medications  . furosemide (LASIX) 40 MG tablet    Sig: 2 po this evening then one po qam starting tomorrow    Dispense:  32 tablet    Refill:  0    Order Specific Question:  Supervising Provider    Answer:  Mikey Kirschner [[id number]]  . lisinopril (PRINIVIL,ZESTRIL) 5 MG tablet    Sig: Take 1 tablet (5 mg total) by mouth daily.    Dispense:  30 tablet    Refill:  0    Order Specific Question:  Supervising Provider    Answer:  Mikey Kirschner [[id number]]  . amoxicillin-clavulanate (AUGMENTIN) 875-125 MG per tablet    Sig: Take 1 tablet by mouth 2 (two) times daily.    Dispense:  20 tablet    Refill:  0    Order Specific Question:  Supervising Provider    Answer:  Mikey Kirschner [[id number]]   Urgent referral to be made to cardiology. Further orders if patient cannot be seen within the next few days. Cancel prostate biopsy until patient is stable. Findings were discussed by phone to patient and his wife. Warning signs reviewed. Patient to go to local ED if symptoms worsen. Further followup depending on when he can be seen by cardiologist. Nasacort AQ and Claritin as directed. Avoid decongestants.

## 2013-10-30 ENCOUNTER — Ambulatory Visit: Payer: BC Managed Care – PPO | Admitting: Cardiology

## 2013-10-31 ENCOUNTER — Ambulatory Visit (INDEPENDENT_AMBULATORY_CARE_PROVIDER_SITE_OTHER): Payer: BC Managed Care – PPO | Admitting: Internal Medicine

## 2013-10-31 ENCOUNTER — Ambulatory Visit (HOSPITAL_COMMUNITY): Payer: BC Managed Care – PPO

## 2013-10-31 VITALS — BP 115/55 | HR 89 | Ht 72.0 in | Wt 295.0 lb

## 2013-10-31 DIAGNOSIS — I509 Heart failure, unspecified: Secondary | ICD-10-CM

## 2013-10-31 NOTE — Progress Notes (Signed)
HPI Pateint is a 63 yo with a history of CAD  He is s/p CABG x 6 in 2001(left  internal mammary artery to distal left anterior descending coronary artery,  saphenous vein graft to ramus intermediate branch, saphenous vein graft to  second circumflex marginal branch and sequential saphenous vein graft to right  posterolateral branch, saphenous vein graft to acute marginal branch, and  sequential saphenous vein graft to posterior descending coronary artery).   He suffered NSTEMI in 2009  He underwent PTCA/BMS to prox an mid SVGs to OM2 and PLA as well as PTCA/PROMUS stent to prox/mid vein graft to diag and vein graft to ramus. The patient has rarely come to cardiology  Follows with S Lukng.   He has been seen in cardiology remotely  Followed by Dr Sallee Lange He was seen in that clinic on 3.3.15 The patient went because over the 2 wks prior he developed worse SOB  Unable to bend and tie shoes without giving out. There, a CXR was done that showed pulmonary edema. The patient was givin Rx for Lasix 80 mg x 1 then 40 mg per day, KCL, Augmentin x 10 days  He says that the next morning he was up and feeling better. Never had fevers, chills or cough.  He admits to eating a lot of hot dogs recently, wife caters, had leftover Allergies  Allergen Reactions  . Dyflex-G [Dyphylline-Guaifenesin] Hives    Current Outpatient Prescriptions  Medication Sig Dispense Refill  . amoxicillin-clavulanate (AUGMENTIN) 875-125 MG per tablet Take 1 tablet by mouth 2 (two) times daily.  20 tablet  0  . aspirin 325 MG tablet Take 325 mg by mouth daily.      . clopidogrel (PLAVIX) 75 MG tablet Take 1 tablet (75 mg total) by mouth daily.  30 tablet  6  . furosemide (LASIX) 40 MG tablet 2 po this evening then one po qam starting tomorrow  32 tablet  0  . HYDROcodone-acetaminophen (NORCO) 10-325 MG per tablet Take 1 tablet by mouth every 4 (four) hours as needed.  45 tablet  0  . lisinopril (PRINIVIL,ZESTRIL) 5 MG  tablet Take 1 tablet (5 mg total) by mouth daily.  30 tablet  0  . metFORMIN (GLUMETZA) 500 MG (MOD) 24 hr tablet Take 1 tablet (500 mg total) by mouth daily with breakfast.  30 tablet  6  . metoprolol tartrate (LOPRESSOR) 25 MG tablet Take 1 tablet (25 mg total) by mouth 2 (two) times daily.  60 tablet  6  . Multiple Vitamins-Minerals (CENTRUM SILVER PO) Take by mouth.      . nitroGLYCERIN (NITROSTAT) 0.4 MG SL tablet Place 1 tablet (0.4 mg total) under the tongue every 5 (five) minutes as needed.  25 tablet  5  . rosuvastatin (CRESTOR) 40 MG tablet Take 1 tablet (40 mg total) by mouth daily.  30 tablet  6   No current facility-administered medications for this visit.    Past Medical History  Diagnosis Date  . CAD (coronary artery disease)   . Hyperlipidemia   . Elevated PSA     Past Surgical History  Procedure Laterality Date  . Appendectomy    . Ingrown toenail N/A   . Carotid stent      No family history on file.  History   Social History  . Marital Status: Married    Spouse Name: N/A    Number of Children: N/A  . Years of Education: N/A   Occupational History  .  Not on file.   Social History Main Topics  . Smoking status: Former Research scientist (life sciences)  . Smokeless tobacco: Not on file  . Alcohol Use: Not on file  . Drug Use: Not on file  . Sexual Activity: Not on file   Other Topics Concern  . Not on file   Social History Narrative  . No narrative on file    Review of Systems:  All systems reviewed.  They are negative to the above problem except as previously stated.  Vital Signs: BP 115/55  Pulse 89  Ht 6' (1.829 m)  Wt 295 lb (133.811 kg)  BMI 40.00 kg/m2  Physical Exam Patient is a morbidly obese 62 yo in NAD HEENT:  Normocephalic, atraumatic. EOMI, PERRLA.  Neck: JVP is normal.  No bruits.  Lungs: clear to auscultation. No rales no wheezes.  Heart: Regular rate and rhythm. Normal S1, S2. No S3.   No significant murmurs. PMI not displaced.  Abdomen:  Supple,  nontender. Normal bowel sounds. No masses. No hepatomegaly.  Extremities:   Good distal pulses throughout. No lower extremity edema.  Musculoskeletal :moving all extremities.  Neuro:   alert and oriented x3.  CN II-XII grossly intact.  EKG:  SR 87  Occasional PVC.  Poor R wave progression.   Assessment and Plan:  1.  Dyspnea and pulm edema.  Probably due to dietary indiscretion.  I would keep on same meds for now.  Would check BMET on Monday. May be able to back off on Lasix if conforms to low NA diet Get echo to reeval LV function  2.  CAD  Remote interventions  I am not convinced of active ischemia.  Wants to follow up with Dr Wolfgang Phoenix only  Get echo  3.  Morbid obesity  Counselled on wt loss  4.  HTN  Continue meds  5.  HL  Keep on Crestor  6.  DM  Continue meds

## 2013-10-31 NOTE — Patient Instructions (Addendum)
Your physician has requested that you have an echocardiogram. Echocardiography is a painless test that uses sound waves to create images of your heart. It provides your doctor with information about the size and shape of your heart and how well your heart's chambers and valves are working. This procedure takes approximately one hour. There are no restrictions for this procedure.  Please get blood work Artist) done   Stop Augmentin Tuesday night     Thank you for choosing [facility] !

## 2013-11-04 ENCOUNTER — Encounter: Payer: Self-pay | Admitting: *Deleted

## 2013-11-04 LAB — BASIC METABOLIC PANEL
BUN: 15 mg/dL (ref 6–23)
CALCIUM: 9.1 mg/dL (ref 8.4–10.5)
CO2: 31 mEq/L (ref 19–32)
Chloride: 99 mEq/L (ref 96–112)
Creat: 0.76 mg/dL (ref 0.50–1.35)
GLUCOSE: 119 mg/dL — AB (ref 70–99)
Potassium: 4.2 mEq/L (ref 3.5–5.3)
SODIUM: 137 meq/L (ref 135–145)

## 2013-11-04 NOTE — Addendum Note (Signed)
Addended by: Truett Mainland on: 11/04/2013 02:51 PM   Modules accepted: Orders

## 2013-11-07 ENCOUNTER — Ambulatory Visit: Payer: BC Managed Care – PPO | Admitting: Urology

## 2013-11-07 ENCOUNTER — Ambulatory Visit (HOSPITAL_COMMUNITY)
Admission: RE | Admit: 2013-11-07 | Discharge: 2013-11-07 | Disposition: A | Discharge status: Home / Self Care | Payer: BC Managed Care – PPO | Source: Ambulatory Visit | Attending: Internal Medicine | Admitting: Internal Medicine

## 2013-11-07 DIAGNOSIS — I517 Cardiomegaly: Secondary | ICD-10-CM

## 2013-11-07 DIAGNOSIS — I252 Old myocardial infarction: Secondary | ICD-10-CM | POA: Insufficient documentation

## 2013-11-07 DIAGNOSIS — Z87891 Personal history of nicotine dependence: Secondary | ICD-10-CM | POA: Insufficient documentation

## 2013-11-07 DIAGNOSIS — E785 Hyperlipidemia, unspecified: Secondary | ICD-10-CM | POA: Insufficient documentation

## 2013-11-07 DIAGNOSIS — I509 Heart failure, unspecified: Secondary | ICD-10-CM | POA: Insufficient documentation

## 2013-11-07 DIAGNOSIS — E119 Type 2 diabetes mellitus without complications: Secondary | ICD-10-CM | POA: Insufficient documentation

## 2013-11-07 DIAGNOSIS — Z6841 Body Mass Index (BMI) 40.0 and over, adult: Secondary | ICD-10-CM | POA: Insufficient documentation

## 2013-11-07 DIAGNOSIS — I2510 Atherosclerotic heart disease of native coronary artery without angina pectoris: Secondary | ICD-10-CM | POA: Insufficient documentation

## 2013-11-07 NOTE — Progress Notes (Signed)
*  PRELIMINARY RESULTS* Echocardiogram 2D Echocardiogram has been performed.  [place], [place] 11/07/2013, 3:11 PM

## 2013-11-24 ENCOUNTER — Ambulatory Visit: Payer: BC Managed Care – PPO | Admitting: Internal Medicine

## 2013-11-24 ENCOUNTER — Encounter: Payer: Self-pay | Admitting: Internal Medicine

## 2013-11-24 ENCOUNTER — Ambulatory Visit (INDEPENDENT_AMBULATORY_CARE_PROVIDER_SITE_OTHER): Payer: BC Managed Care – PPO | Admitting: Internal Medicine

## 2013-11-24 VITALS — BP 122/71 | HR 90 | Ht 73.0 in | Wt 296.0 lb

## 2013-11-24 DIAGNOSIS — E119 Type 2 diabetes mellitus without complications: Secondary | ICD-10-CM

## 2013-11-24 DIAGNOSIS — I509 Heart failure, unspecified: Secondary | ICD-10-CM

## 2013-11-24 DIAGNOSIS — E785 Hyperlipidemia, unspecified: Secondary | ICD-10-CM

## 2013-11-24 DIAGNOSIS — I25810 Atherosclerosis of coronary artery bypass graft(s) without angina pectoris: Secondary | ICD-10-CM

## 2013-11-24 DIAGNOSIS — I5022 Chronic systolic (congestive) heart failure: Secondary | ICD-10-CM

## 2013-11-24 MED ORDER — METOPROLOL TARTRATE 50 MG PO TABS: 50.0000 mg | ORAL_TABLET | Freq: Two times a day (BID) | ORAL | Status: DC

## 2013-11-24 MED ORDER — ROSUVASTATIN CALCIUM 40 MG PO TABS: 40.0000 mg | ORAL_TABLET | Freq: Every day | ORAL | Status: DC

## 2013-11-24 NOTE — Patient Instructions (Signed)
Your physician recommends that you schedule a follow-up appointment in: 6-8 weeks   Your physician has recommended you make the following change in your medication:    INCREASE Metoprolol to 50 mg twice a day    Thank you for choosing [facility] !

## 2013-11-24 NOTE — Progress Notes (Signed)
HPI Pateint is a 63 yo with a history of CAD  He is s/p CABG x 6 in 2001(left  internal mammary artery to distal left anterior descending coronary artery,  saphenous vein graft to ramus intermediate branch, saphenous vein graft to  second circumflex marginal branch and sequential saphenous vein graft to right  posterolateral branch, saphenous vein graft to acute marginal branch, and  sequential saphenous vein graft to posterior descending coronary artery).   He suffered NSTEMI in 2009  He underwent PTCA to vein graft to R PLSA.  I saw the patient this winter  He had been treated for CHF symptoms by S Luking. Since lasix started he has felt much better  Denies CP  Says he can finally tie shoes without getting SOB. I recomm an echo  This was done and showed severe LV dysfunction with an LVEF of 15%  I spoke to patient on phone  He did not want to proceed with furhter testing at time.    Still denies CP  No PND  Breathing is OK   Allergies  Allergen Reactions  . Dyflex-G [Dyphylline-Guaifenesin] Hives    Current Outpatient Prescriptions  Medication Sig Dispense Refill  . amoxicillin-clavulanate (AUGMENTIN) 875-125 MG per tablet Take 1 tablet by mouth 2 (two) times daily.  20 tablet  0  . aspirin 325 MG tablet Take 325 mg by mouth daily.      . clopidogrel (PLAVIX) 75 MG tablet Take 1 tablet (75 mg total) by mouth daily.  30 tablet  6  . furosemide (LASIX) 40 MG tablet 2 po this evening then one po qam starting tomorrow  32 tablet  0  . HYDROcodone-acetaminophen (NORCO) 10-325 MG per tablet Take 1 tablet by mouth every 4 (four) hours as needed.  45 tablet  0  . lisinopril (PRINIVIL,ZESTRIL) 5 MG tablet Take 1 tablet (5 mg total) by mouth daily.  30 tablet  0  . metFORMIN (GLUMETZA) 500 MG (MOD) 24 hr tablet Take 1 tablet (500 mg total) by mouth daily with breakfast.  30 tablet  6  . metoprolol tartrate (LOPRESSOR) 25 MG tablet Take 1 tablet (25 mg total) by mouth 2 (two) times daily.  60  tablet  6  . Multiple Vitamins-Minerals (CENTRUM SILVER PO) Take by mouth.      . nitroGLYCERIN (NITROSTAT) 0.4 MG SL tablet Place 1 tablet (0.4 mg total) under the tongue every 5 (five) minutes as needed.  25 tablet  5  . rosuvastatin (CRESTOR) 40 MG tablet Take 1 tablet (40 mg total) by mouth daily.  90 tablet  3   No current facility-administered medications for this visit.    Past Medical History  Diagnosis Date  . CAD (coronary artery disease)   . Hyperlipidemia   . Elevated PSA     Past Surgical History  Procedure Laterality Date  . Appendectomy    . Ingrown toenail N/A   . Carotid stent      No family history on file.  History   Social History  . Marital Status: Married    Spouse Name: N/A    Number of Children: N/A  . Years of Education: N/A   Occupational History  . Not on file.   Social History Main Topics  . Smoking status: Former Research scientist (life sciences)  . Smokeless tobacco: Not on file  . Alcohol Use: Not on file  . Drug Use: Not on file  . Sexual Activity: Not on file   Other Topics Concern  .  Not on file   Social History Narrative  . No narrative on file    Review of Systems:  All systems reviewed.  They are negative to the above problem except as previously stated.  Vital Signs: BP 122/71  Pulse 90  Ht 6\' 1"  (1.854 m)  Wt 296 lb (134.265 kg)  BMI 39.06 kg/m2  SpO2 97%  Physical Exam Patient is a morbidly obese 63 yo in NAD HEENT:  Normocephalic, atraumatic. EOMI, PERRLA.  Neck: JVP is normal.  No bruits.  Lungs: clear to auscultation. No rales no wheezes.  Heart: Regular rate and rhythm. Normal S1, S2. No S3.   No significant murmurs. PMI not displaced.  Abdomen:  Supple, nontender. Normal bowel sounds. No masses. No hepatomegaly.  Extremities:   Good distal pulses throughout. Tr lower extremity edema.  Musculoskeletal :moving all extremities.  Neuro:   alert and oriented x3.  CN II-XII grossly intact.   Assessment and Plan:  1.  Chronic systolic  CHF  LVEF is severely depressed  (In 2009 was estimated at about 49%) I discussed concern re bypass grafts  I would recomm cath to redefine anatomy  He will reflect on this and get back For now I would increase metoprolol to 50 bid.  Continue lisinopril 5 that I added after echo.   2.  CAD  As noted above  3.  Morbid obesity  Counselled on wt loss  4.  HTN  Continue meds  5.  HL  Keep on Crestor  6.  DM  Continue meds  F/U 6 to 8 wks.

## 2013-12-23 ENCOUNTER — Other Ambulatory Visit: Payer: Self-pay | Admitting: Nurse Practitioner

## 2014-06-05 ENCOUNTER — Telehealth: Payer: Self-pay | Admitting: Family Medicine

## 2014-06-05 DIAGNOSIS — E119 Type 2 diabetes mellitus without complications: Secondary | ICD-10-CM

## 2014-06-05 DIAGNOSIS — Z125 Encounter for screening for malignant neoplasm of prostate: Secondary | ICD-10-CM

## 2014-06-05 DIAGNOSIS — Z79899 Other long term (current) drug therapy: Secondary | ICD-10-CM

## 2014-06-05 DIAGNOSIS — E785 Hyperlipidemia, unspecified: Secondary | ICD-10-CM

## 2014-06-05 NOTE — Telephone Encounter (Signed)
Please call tell the patient is in his best interest to follow up regarding this issue. If he refuses to followup with urologist I highly recommend to followup with [place] and I highly recommend that he do PSA, lipid, liver, hemoglobin A1c, urine micro-protein, CBC before his visit. Let the patient noted the elevated PSA can sometimes indicate possibility of cancer. He needs to take this seriously and follow-through.

## 2014-06-05 NOTE — Telephone Encounter (Signed)
Discussed with patient. Patient does not want to see specialist. Patient scheduled follow up office visit with Dr. Nicki Reaper. Blood work ordered in [employer].

## 2014-06-05 NOTE — Telephone Encounter (Signed)
Calling to let you know that patient declined to follow up with Dr. Jeffie Pollock on his PSA.

## 2014-06-06 LAB — CBC WITH DIFFERENTIAL/PLATELET
BASOS PCT: 0 % (ref 0–1)
Basophils Absolute: 0 10*3/uL (ref 0.0–0.1)
EOS ABS: 0.2 10*3/uL (ref 0.0–0.7)
EOS PCT: 3 % (ref 0–5)
HCT: 44.3 % (ref 39.0–52.0)
Hemoglobin: 15.2 g/dL (ref 13.0–17.0)
Lymphocytes Relative: 23 % (ref 12–46)
Lymphs Abs: 1.4 10*3/uL (ref 0.7–4.0)
MCH: 31.6 pg (ref 26.0–34.0)
MCHC: 34.3 g/dL (ref 30.0–36.0)
MCV: 92.1 fL (ref 78.0–100.0)
MONOS PCT: 9 % (ref 3–12)
Monocytes Absolute: 0.6 10*3/uL (ref 0.1–1.0)
NEUTROS ABS: 4.1 10*3/uL (ref 1.7–7.7)
Neutrophils Relative %: 65 % (ref 43–77)
Platelets: 257 10*3/uL (ref 150–400)
RBC: 4.81 MIL/uL (ref 4.22–5.81)
RDW: 14.4 % (ref 11.5–15.5)
WBC: 6.3 10*3/uL (ref 4.0–10.5)

## 2014-06-07 LAB — MICROALBUMIN, URINE: Microalb, Ur: 1.4 mg/dL (ref <2.0)

## 2014-06-07 LAB — HEPATIC FUNCTION PANEL
ALK PHOS: 63 U/L (ref 39–117)
ALT: 19 U/L (ref 0–53)
AST: 16 U/L (ref 0–37)
Albumin: 3.9 g/dL (ref 3.5–5.2)
BILIRUBIN INDIRECT: 0.6 mg/dL (ref 0.2–1.2)
Bilirubin, Direct: 0.1 mg/dL (ref 0.0–0.3)
TOTAL PROTEIN: 7 g/dL (ref 6.0–8.3)
Total Bilirubin: 0.7 mg/dL (ref 0.2–1.2)

## 2014-06-07 LAB — HEMOGLOBIN A1C
Hgb A1c MFr Bld: 6.2 % — ABNORMAL HIGH (ref <5.7)
MEAN PLASMA GLUCOSE: 131 mg/dL — AB (ref <117)

## 2014-06-07 LAB — LIPID PANEL
CHOLESTEROL: 190 mg/dL (ref 0–200)
HDL: 32 mg/dL — ABNORMAL LOW (ref >39)
LDL Cholesterol: 118 mg/dL — ABNORMAL HIGH (ref 0–99)
TRIGLYCERIDES: 201 mg/dL — AB (ref <150)
Total CHOL/HDL Ratio: 5.9 Ratio
VLDL: 40 mg/dL (ref 0–40)

## 2014-06-08 LAB — PSA: PSA: 5.5 ng/mL — ABNORMAL HIGH (ref <=4.00)

## 2014-06-25 ENCOUNTER — Encounter: Payer: Self-pay | Admitting: Family Medicine

## 2014-06-25 ENCOUNTER — Ambulatory Visit (INDEPENDENT_AMBULATORY_CARE_PROVIDER_SITE_OTHER): Payer: BC Managed Care – PPO | Admitting: Family Medicine

## 2014-06-25 VITALS — BP 136/82 | Ht 72.0 in | Wt 307.0 lb

## 2014-06-25 DIAGNOSIS — E785 Hyperlipidemia, unspecified: Secondary | ICD-10-CM

## 2014-06-25 DIAGNOSIS — R972 Elevated prostate specific antigen [PSA]: Secondary | ICD-10-CM

## 2014-06-25 DIAGNOSIS — R7309 Other abnormal glucose: Secondary | ICD-10-CM

## 2014-06-25 DIAGNOSIS — I255 Ischemic cardiomyopathy: Secondary | ICD-10-CM | POA: Insufficient documentation

## 2014-06-25 DIAGNOSIS — Z23 Encounter for immunization: Secondary | ICD-10-CM

## 2014-06-25 DIAGNOSIS — R7303 Prediabetes: Secondary | ICD-10-CM

## 2014-06-25 MED ORDER — HYDROCODONE-ACETAMINOPHEN 10-325 MG PO TABS: 1.0000 | ORAL_TABLET | ORAL | Status: DC | PRN

## 2014-06-25 NOTE — Progress Notes (Signed)
   Subjective:    Patient ID: [name] [name], male    DOB: [birth year], 63 y.o.   MRN: [id number]  HPI Patient arrives to follow up on blood work results. He has had some fatigue lately he states he gives out of energy if he does much denies chest pressure tightness or pain with it I did have lab work completed recently. We will over the results of this today He does have diabetes hypertension cardiomyopathy and history of heart disease he denies chest pressure or pain. He states he is not interested in seeing another specialist currently.  Review of Systems Denies headache chest pain shortness of breath does relate fatigue and tiredness when he does try to do things. Denies PND denies swelling in the legs.    Objective:   Physical Exam Lungs are clear no crackle heart regular pulse normal blood pressure good abdomen soft obese extremities trace edema skin warm dry   25 minutes spent with patient covering these multiple issues    Assessment & Plan:  Diabetes-A1c were reasonable. Patient encouraged to watch diet stay physically active take his medications.  Hyperlipidemia good control currently LDL slightly higher than what we like to see but he relates he is taking maximum dose of Crestor and watching diet  Cardiomyopathy patient denies chest pain denies PND he relates compliance with his medicines he is to continue his medications and follow-up again in 6 months  Elevated PSA patient was told that this could indicate prostate cancer the PSA is lower than what it was he would like to recheck it again in 6 months time. He does not want to go through additional testing or procedures currently  Colonoscopy was recommended.

## 2014-06-25 NOTE — Patient Instructions (Signed)
Continue all your medicines  In 6 months follow up here, you will need to do blood work before your visit  Do your sleep survey  You should do a colonoscopy/ Dr Laural Golden

## 2014-07-17 ENCOUNTER — Telehealth: Payer: Self-pay | Admitting: Family Medicine

## 2014-07-17 DIAGNOSIS — G4730 Sleep apnea, unspecified: Principal | ICD-10-CM

## 2014-07-17 DIAGNOSIS — G4710 Hypersomnia, unspecified: Secondary | ICD-10-CM

## 2014-07-17 NOTE — Telephone Encounter (Signed)
Please review [facility] Questionnaire mailed by patient attached to chart.

## 2014-07-19 NOTE — Telephone Encounter (Signed)
The patient's [place] questionnaire points that he is at higher risk for sleep apnea. It is recommended for a sleep study split protocol if possible this may be with Dr.[place]. Sleep apnea increases the risk of strokes and heart attacks as well as causes significant fatigue Please talk with the patient set this up. It is certainly possible that the patient might refuse if so please document.

## 2014-07-20 NOTE — Telephone Encounter (Signed)
[facility]

## 2014-07-24 ENCOUNTER — Other Ambulatory Visit: Payer: Self-pay | Admitting: *Deleted

## 2014-07-24 MED ORDER — METFORMIN HCL ER 500 MG PO TB24: 500.0000 mg | ORAL_TABLET | Freq: Every day | ORAL | Status: DC

## 2014-08-04 NOTE — Telephone Encounter (Signed)
Discussed with patient. Patient verbalized understanding and wants to proceed with referral. Referral placed in Epic.

## 2014-08-05 ENCOUNTER — Other Ambulatory Visit (HOSPITAL_COMMUNITY): Payer: Self-pay | Admitting: Respiratory Therapy

## 2014-08-05 DIAGNOSIS — G4710 Hypersomnia, unspecified: Secondary | ICD-10-CM

## 2014-08-05 DIAGNOSIS — G4730 Sleep apnea, unspecified: Principal | ICD-10-CM

## 2014-08-11 ENCOUNTER — Other Ambulatory Visit: Payer: Self-pay | Admitting: Family Medicine

## 2014-08-16 ENCOUNTER — Ambulatory Visit: Payer: BC Managed Care – PPO | Attending: Family Medicine | Admitting: Sleep Medicine

## 2014-08-16 DIAGNOSIS — Z7902 Long term (current) use of antithrombotics/antiplatelets: Secondary | ICD-10-CM | POA: Insufficient documentation

## 2014-08-16 DIAGNOSIS — G4710 Hypersomnia, unspecified: Secondary | ICD-10-CM

## 2014-08-16 DIAGNOSIS — G4733 Obstructive sleep apnea (adult) (pediatric): Secondary | ICD-10-CM | POA: Diagnosis present

## 2014-08-16 DIAGNOSIS — Z7982 Long term (current) use of aspirin: Secondary | ICD-10-CM | POA: Insufficient documentation

## 2014-08-16 DIAGNOSIS — Z79899 Other long term (current) drug therapy: Secondary | ICD-10-CM | POA: Diagnosis not present

## 2014-08-16 DIAGNOSIS — G4730 Sleep apnea, unspecified: Secondary | ICD-10-CM

## 2014-08-19 NOTE — Sleep Study (Signed)
  [place] A. Merlene Laughter, MD     [email or web address]        NOCTURNAL POLYSOMNOGRAM    LOCATION: SLEEP LAB FACILITY: [facility]   PHYSICIAN: Wyona Neils A. Merlene Laughter, M.D.   DATE OF STUDY: 08/16/2014.   REFERRING PHYSICIAN: Sallee Lange.   INDICATIONS: The patient is 63 year old presents with loud snoring, witnessed apnea and hypersomnia.  MEDICATIONS:  Prior to Admission medications   Medication Sig Start Date End Date Taking? Authorizing Provider  aspirin 325 MG tablet Take 325 mg by mouth daily.    Historical Provider, MD  clopidogrel (PLAVIX) 75 MG tablet Take 1 tablet (75 mg total) by mouth daily. 05/14/13   Kathyrn Drown, MD  furosemide (LASIX) 40 MG tablet TAKE 2 TABLETS BY MOUTH THIS EVENING THEN ONE DAILY EVERY MORNING STARTING TOMMROW. 12/23/13   Kathyrn Drown, MD  HYDROcodone-acetaminophen (NORCO) 10-325 MG per tablet Take 1 tablet by mouth every 4 (four) hours as needed. 06/25/14   Kathyrn Drown, MD  lisinopril (PRINIVIL,ZESTRIL) 5 MG tablet TAKE ONE TABLET BY MOUTH ONCE DAILY. 08/11/14   Kathyrn Drown, MD  metFORMIN (GLUCOPHAGE-XR) 500 MG 24 hr tablet Take 1 tablet (500 mg total) by mouth daily with breakfast. 07/24/14   Kathyrn Drown, MD  metoprolol tartrate (LOPRESSOR) 25 MG tablet TAKE ONE TABLET BY MOUTH TWICE DAILY. 08/11/14   Kathyrn Drown, MD  Multiple Vitamins-Minerals (CENTRUM SILVER PO) Take by mouth.    Historical Provider, MD  nitroGLYCERIN (NITROSTAT) 0.4 MG SL tablet Place 1 tablet (0.4 mg total) under the tongue every 5 (five) minutes as needed. 05/14/13   Kathyrn Drown, MD  rosuvastatin (CRESTOR) 40 MG tablet Take 1 tablet (40 mg total) by mouth daily. 11/24/13   Fay Records, MD      EPWORTH SLEEPINESS SCALE: 3.   BMI: 42.   ARCHITECTURAL SUMMARY: Total recording time was 359 minutes. Sleep efficiency 32 %. Sleep latency 55 minutes. REM latency 82 minutes. Stage NI 13 %, N2 68 % and N3 0 % and REM sleep 19 %.    RESPIRATORY DATA:   Baseline oxygen saturation is 97 %. The lowest saturation is 79 %. The diagnostic AHI is 63. The RDI is 63. The REM AHI is 72. The patient was started on positive pressure started at 5. Unfortunately, the patient did not tolerate the mask because of new mask smell apparently irritating him. He subsequently to the mask off.  LIMB MOVEMENT SUMMARY: PLM index 0.   ELECTROCARDIOGRAM SUMMARY: Average heart rate is 88 with no significant dysrhythmias observed.   IMPRESSION:  1. Severe obstructive sleep apnea syndrome. The patient did not tolerate CPAP mostly due to the new CPAP mask smell. AutoPap is recommended between the pressures of 8-15. 2. Abnormal sleep architecture with poor sleep efficiency and absent slow-wave sleep.  Thanks for this referral.  Audreyanna Butkiewicz A. Merlene Laughter, M.D. Diplomat, Tax adviser of Sleep Medicine.

## 2014-09-10 ENCOUNTER — Other Ambulatory Visit: Payer: Self-pay | Admitting: Family Medicine

## 2014-09-21 ENCOUNTER — Telehealth: Payer: Self-pay | Admitting: Family Medicine

## 2014-09-21 MED ORDER — HYDROCODONE-ACETAMINOPHEN 10-325 MG PO TABS: 1.0000 | ORAL_TABLET | ORAL | Status: DC | PRN

## 2014-09-21 NOTE — Telephone Encounter (Signed)
Pt is requesting a refill on his hydrocodone.  

## 2014-09-21 NOTE — Telephone Encounter (Signed)
He may have a prescription for 30 tablets. He will need to follow-up for an office visit before further prescriptions. It is recommended for this patient to follow-up by no later than the end of March

## 2014-09-21 NOTE — Telephone Encounter (Signed)
Notified patient that script is ready for pickup. Transferred patient to front desk to schedule appointment.

## 2014-09-21 NOTE — Telephone Encounter (Signed)
Last seen 06/25/14

## 2014-10-02 ENCOUNTER — Ambulatory Visit (INDEPENDENT_AMBULATORY_CARE_PROVIDER_SITE_OTHER): Payer: BLUE CROSS/BLUE SHIELD | Admitting: Family Medicine

## 2014-10-02 ENCOUNTER — Encounter: Payer: Self-pay | Admitting: Family Medicine

## 2014-10-02 ENCOUNTER — Ambulatory Visit (HOSPITAL_COMMUNITY)
Admission: RE | Admit: 2014-10-02 | Discharge: 2014-10-02 | Disposition: A | Discharge status: Home / Self Care | Payer: BLUE CROSS/BLUE SHIELD | Source: Ambulatory Visit | Attending: Family Medicine | Admitting: Family Medicine

## 2014-10-02 ENCOUNTER — Telehealth: Payer: Self-pay | Admitting: Family Medicine

## 2014-10-02 VITALS — BP 124/80 | Ht 72.0 in | Wt 291.0 lb

## 2014-10-02 DIAGNOSIS — M25512 Pain in left shoulder: Secondary | ICD-10-CM | POA: Insufficient documentation

## 2014-10-02 DIAGNOSIS — G4733 Obstructive sleep apnea (adult) (pediatric): Secondary | ICD-10-CM | POA: Insufficient documentation

## 2014-10-02 MED ORDER — HYDROCODONE-ACETAMINOPHEN 10-325 MG PO TABS: 1.0000 | ORAL_TABLET | ORAL | Status: DC | PRN

## 2014-10-02 NOTE — Progress Notes (Signed)
   Subjective:    Patient ID: [name] [name], male    DOB: [birth year], 64 y.o.   MRN: [id number]  Shoulder Pain  The pain is present in the left shoulder. This is a new problem. The current episode started more than 1 month ago. There has been a history of trauma. The problem occurs constantly. The problem has been unchanged. The quality of the pain is described as aching. The pain is moderate. Associated symptoms include a limited range of motion. The symptoms are aggravated by activity. He has tried oral narcotics for the symptoms. The treatment provided mild relief.   Patient states that he has no concerns at this time.   Patient also had multiple questions regarding sleep apnea recent testing in the results. We went over sleep apnea the cause of it the treatment the risk and benefits of treating it patient is in ingredients to trying CPAP 25 minutes spent with this patient Review of Systems See above    Objective:   Physical Exam  Lungs clear heart regular left shoulder pain tenderness stiffness decreased range of motion is difficult time raising it to decide him raising it forward      Assessment & Plan:  #1 left shoulder pain--x-rays indicated. Pain medicine if necessary. Patient was instructed that we will be setting him up with orthopedics. I am concerned about what's going on here he has limited range of motion. It is hard to discern if there is a nerve impingement as well. Probable rotator cuff damage as well.  #2 sleep apnea-this patient would benefit from CPAP apparently what they tried him on at the hospital was difficult for him to tolerate in regards to titration but he is willing to try we can do home titration I will see if we can arrange for him a auto titration machine through [employer]

## 2014-10-02 NOTE — Telephone Encounter (Signed)
Pt was seen today for his shoulder, he also spoke to you about his Sleep Study And the fact that he will possibly need a cpap. He went to see what the cpap Was about but they told him they could not see it without a script. He would like to  Know if you were going to go ahead a write the script for this so they can get the rolling On this.   Please advise  I did inform the patient this would not be done today but at a later date most likely   [place]

## 2014-10-04 NOTE — Telephone Encounter (Signed)
Nurses, this patient has sleep apnea. Please call [facility] speak with the respiratory therapist who can help with ordering proper equipment. This patient tried to do a titration study at the hospital but failed to do so because of the apparatus. What I would like if possible is a either a home titration study to find out proper pressure or a CPAP machine with auto titration. Please explain the situation to the respiratory therapist at [facility]. See if they can help you know what to specifically order for this patient. Write it on a prescription I will sign it. A copy of his sleep study would also need to be made from the medical record to be forwarded to them. Once this is completed please either notify the patient's family directly or make sure [facility] notifies the patient's family thank you

## 2014-10-15 ENCOUNTER — Ambulatory Visit (INDEPENDENT_AMBULATORY_CARE_PROVIDER_SITE_OTHER): Payer: BLUE CROSS/BLUE SHIELD | Admitting: Orthopedic Surgery

## 2014-10-15 ENCOUNTER — Encounter: Payer: Self-pay | Admitting: Orthopedic Surgery

## 2014-10-15 ENCOUNTER — Ambulatory Visit: Payer: BLUE CROSS/BLUE SHIELD | Admitting: Orthopedic Surgery

## 2014-10-15 VITALS — BP 118/80 | Ht 72.0 in | Wt 291.0 lb

## 2014-10-15 DIAGNOSIS — M75102 Unspecified rotator cuff tear or rupture of left shoulder, not specified as traumatic: Secondary | ICD-10-CM

## 2014-10-15 DIAGNOSIS — S43005A Unspecified dislocation of left shoulder joint, initial encounter: Secondary | ICD-10-CM

## 2014-10-15 NOTE — Patient Instructions (Signed)
We will schedule MRI for you and will call you with results

## 2014-10-15 NOTE — Progress Notes (Signed)
Patient ID: [name] [name], male   DOB: 1951-04-08, 64 y.o.   MRN: [id number]  Chief Complaint  Patient presents with  . Shoulder Pain    Left shoulder pain, referred by Dr. Sallee Lange. DOI 06-30-14.    HPI [name] [name] is a 64 y.o. male.  Left shoulder weakness HPI 64 years old male dislocated his left shoulder many years ago repeat dislocation recently he put the shoulder back in himself but after that he couldn't lift his arm above his head   Review of Systems Review of Systems Neurologic system normal  Cardiovascular system history of bypass no recent chest pain, however he was told that 2 of the valves in his heart are not putting out enough fluid. I will discuss with Dr. Sallee Lange   Past Medical History  Diagnosis Date  . CAD (coronary artery disease)   . Hyperlipidemia   . Elevated PSA     Past Surgical History  Procedure Laterality Date  . Appendectomy    . Ingrown toenail N/A   . Carotid stent      No family history on file.  Social History History  Substance Use Topics  . Smoking status: Former Research scientist (life sciences)  . Smokeless tobacco: Not on file  . Alcohol Use: Not on file    Allergies  Allergen Reactions  . Dyflex-G [Dyphylline-Guaifenesin] Hives    Current Outpatient Prescriptions  Medication Sig Dispense Refill  . aspirin 325 MG tablet Take 325 mg by mouth daily.    . clopidogrel (PLAVIX) 75 MG tablet TAKE ONE TABLET BY MOUTH DAILY. 30 tablet 0  . furosemide (LASIX) 40 MG tablet TAKE 2 TABLETS BY MOUTH THIS EVENING THEN ONE DAILY EVERY MORNING STARTING TOMMROW. 32 tablet 1  . HYDROcodone-acetaminophen (NORCO) 10-325 MG per tablet Take 1 tablet by mouth every 4 (four) hours as needed. 30 tablet 0  . lisinopril (PRINIVIL,ZESTRIL) 5 MG tablet TAKE ONE TABLET BY MOUTH ONCE DAILY. 30 tablet 0  . metFORMIN (GLUCOPHAGE-XR) 500 MG 24 hr tablet Take 1 tablet (500 mg total) by mouth daily with breakfast. 30 tablet 2  . metoprolol tartrate (LOPRESSOR) 25 MG  tablet TAKE ONE TABLET BY MOUTH TWICE DAILY. 60 tablet 0  . Multiple Vitamins-Minerals (CENTRUM SILVER PO) Take by mouth.    . nitroGLYCERIN (NITROSTAT) 0.4 MG SL tablet Place 1 tablet (0.4 mg total) under the tongue every 5 (five) minutes as needed. 25 tablet 5  . rosuvastatin (CRESTOR) 40 MG tablet Take 1 tablet (40 mg total) by mouth daily. 90 tablet 3   No current facility-administered medications for this visit.       Physical Exam Blood pressure 118/80, height 6' (1.829 m), weight 291 lb (131.997 kg). Physical Exam The patient is well developed well nourished and well groomed. Orientation to person place and time is normal  Mood is pleasant. Ambulatory status normal without assistive devices body habitus mesomorphic Lower extremities no contracture subluxation atrophy tremor skin changes or pulse abnormality. Body mass index is 39.46 kg/(m^2). Right shoulder full forward elevation strength normal skin intact good pulses no tenderness  Left shoulder tenderness deltoid area normal in terms of sensory exam pulses are good is passive range of motion his external rotation 40 pain, extension 30 normal. Abduction active and passive 70/150. The patient has a positive drop test for cuff tear. For elevation active 80. Weakness supraspinatus 2 out of 5  Right and left cervical right live axillary and right left supraclavicular Lymph nodes are negative. Cervical  exam benign.  Data Reviewed Independent interpretation of the hospital x-ray shows no fracture. No major glenoid abnormality.  Assessment    Encounter Diagnoses  Name Primary?  . Left rotator cuff tear Yes  . Shoulder dislocation, left, initial encounter         Plan    MRI left shoulder  Patient has rotator cuff tear. I discussed this with him. If he has cuff tear will probably have to repair.  He will need a preop medical clearance and evaluation of his heart.

## 2014-10-20 ENCOUNTER — Other Ambulatory Visit: Payer: Self-pay | Admitting: Family Medicine

## 2014-10-23 ENCOUNTER — Other Ambulatory Visit: Payer: Self-pay | Admitting: Orthopedic Surgery

## 2014-10-23 DIAGNOSIS — M75102 Unspecified rotator cuff tear or rupture of left shoulder, not specified as traumatic: Secondary | ICD-10-CM

## 2014-11-02 ENCOUNTER — Other Ambulatory Visit: Payer: Self-pay | Admitting: Family Medicine

## 2014-11-06 ENCOUNTER — Other Ambulatory Visit: Payer: Self-pay | Admitting: Family Medicine

## 2014-11-09 ENCOUNTER — Ambulatory Visit
Admission: RE | Admit: 2014-11-09 | Discharge: 2014-11-09 | Disposition: A | Discharge status: Home / Self Care | Payer: BLUE CROSS/BLUE SHIELD | Source: Ambulatory Visit | Attending: Orthopedic Surgery | Admitting: Orthopedic Surgery

## 2014-11-09 DIAGNOSIS — M75102 Unspecified rotator cuff tear or rupture of left shoulder, not specified as traumatic: Secondary | ICD-10-CM

## 2014-11-13 ENCOUNTER — Other Ambulatory Visit: Payer: Self-pay | Admitting: Family Medicine

## 2014-11-18 ENCOUNTER — Telehealth: Payer: Self-pay | Admitting: Orthopedic Surgery

## 2014-11-18 NOTE — Telephone Encounter (Signed)
Called several times  He has a massive rotator cuff tear   Needs referral to dr Tamera Punt   For repair vs replacement (reverse)  ---------------------------

## 2014-11-19 ENCOUNTER — Ambulatory Visit: Payer: Self-pay | Admitting: Family Medicine

## 2014-11-20 ENCOUNTER — Ambulatory Visit: Payer: Self-pay | Admitting: Family Medicine

## 2014-11-23 ENCOUNTER — Telehealth: Payer: Self-pay | Admitting: *Deleted

## 2014-11-23 ENCOUNTER — Other Ambulatory Visit: Payer: Self-pay | Admitting: *Deleted

## 2014-11-23 DIAGNOSIS — M75102 Unspecified rotator cuff tear or rupture of left shoulder, not specified as traumatic: Secondary | ICD-10-CM

## 2014-11-23 NOTE — Telephone Encounter (Signed)
FAXED REFERRAL TO [place]

## 2014-12-01 ENCOUNTER — Telehealth: Payer: Self-pay | Admitting: Family Medicine

## 2014-12-01 MED ORDER — HYDROCODONE-ACETAMINOPHEN 10-325 MG PO TABS: 1.0000 | ORAL_TABLET | ORAL | Status: DC | PRN

## 2014-12-01 NOTE — Telephone Encounter (Signed)
Pt left message on my voicemail yesterday, states his back and shoulder are bothering him again and needs a refill on his pain medicine. (med list shows Hydrocodone-APAP) Please let pt know when done

## 2014-12-01 NOTE — Telephone Encounter (Signed)
May have Rx for 30 tablets, when pt needs next Rx he will need ov before further bcz class II med

## 2014-12-01 NOTE — Telephone Encounter (Signed)
Notified patient script ready for pickup and will need office visit for further refills.

## 2014-12-01 NOTE — Telephone Encounter (Signed)
Last seen 2/16

## 2014-12-09 NOTE — Telephone Encounter (Signed)
APPT HAS BEEN SCHEDULED

## 2014-12-16 NOTE — Progress Notes (Signed)
Patient ID: [name] [name], male   DOB: November 20, 1950, 64 y.o.   MRN: [id number]  Primary Cardiologist:  [name] [name] is a 64 yo with large left rotator cuff tear in need of surgery He has an extensive   history of CAD He is s/p CABG x 6 in 2001  (leftinternal mammary artery to distal left anterior descending coronary artery,  saphenous vein graft to ramus intermediate branch, saphenous vein graft to  second circumflex marginal branch and sequential saphenous vein graft to right  posterolateral branch, saphenous vein graft to acute marginal branch, and  sequential saphenous vein graft to posterior descending coronary artery).   He suffered NSTEMI in 2009 He underwent PTCA/BMS to prox an mid SVGs to OM2 and PLA as well as PTCA/PROMUS stent to prox/mid vein graft to diag and vein graft to ramus. Last cath 2012 medical Rx EF 49%  Poor distal runoff in OM and PDA/PLA grafts   Seen by [name] [name] 2015 with CHF symptoms Rx with lasix  Echo 11/07/13 reviewed and EF 15%  Mild RV enlargement  Was encouraged to f/u but did not and meds adjusted.    He is very adamant about being done wrong with too many stents.  He has not had clinical CHF since seeing [name] [name].  He clearly states that he does not want AICD.  I told him I could not Clear him to have shoulder surgery given his obesity with unRx OSA, airway issues and severe ischemic DCM.  After much discussion he is willing to have diagnostic heart cath to see Wear he stands.  Does not want intervention or stents.    Hopefully interventionalist can do from left radial approach.  Risks including stroke, death, bleeding and emergency surgery discussed.  He did indicate he would be willing to have repeat CABG if needed but not any stents   ROS: Denies fever, malais, weight loss, blurry vision, decreased visual acuity, cough, sputum, SOB, hemoptysis, pleuritic pain, palpitaitons, heartburn, abdominal pain, melena, lower extremity edema, claudication, or  rash.  All other systems reviewed and negative  General: Affect appropriate Obese white male  HEENT: normal Neck supple with no adenopathy JVP normal no bruits no thyromegaly Lungs clear with no wheezing and good diaphragmatic motion Heart:  S1/S2 no murmur, no rub, gallop or click PMI normal Abdomen: benighn, BS positve, no tenderness, no AAA no bruit.  No HSM or HJR Distal pulses intact with no bruits No edema Neuro non-focal Skin warm and dry Left arm weakness with decreased ROM     Current Outpatient Prescriptions  Medication Sig Dispense Refill  . aspirin 325 MG tablet Take 325 mg by mouth daily.    . clopidogrel (PLAVIX) 75 MG tablet TAKE ONE TABLET BY MOUTH DAILY. 30 tablet 0  . furosemide (LASIX) 40 MG tablet TAKE ONE TABLET DAILY EVERY MORNING. 30 tablet 1  . HYDROcodone-acetaminophen (NORCO) 10-325 MG per tablet Take 1 tablet by mouth every 4 (four) hours as needed. 30 tablet 0  . lisinopril (PRINIVIL,ZESTRIL) 5 MG tablet TAKE ONE TABLET BY MOUTH ONCE DAILY. 30 tablet 1  . metFORMIN (GLUCOPHAGE-XR) 500 MG 24 hr tablet Take 1 tablet (500 mg total) by mouth daily with breakfast. 30 tablet 2  . metoprolol tartrate (LOPRESSOR) 25 MG tablet TAKE ONE TABLET BY MOUTH TWICE DAILY. 60 tablet 0  . Multiple Vitamins-Minerals (CENTRUM SILVER PO) Take by mouth.    Marland Kitchen NITROSTAT 0.4 MG SL tablet PLACE 1 TABLET UNDER TONGUE FOR CHEST PAIN.  MAY REPEAT EVERY 5 MIN UPTO 3 DOSES-NO RELIEF,CALL 911. 25 tablet 1  . rosuvastatin (CRESTOR) 40 MG tablet Take 1 tablet (40 mg total) by mouth daily. 90 tablet 3   No current facility-administered medications for this visit.    Allergies  Dyflex-g  Electrocardiogram:  10/28/13  SR low voltage/ poor R wave progression PVC  Assessment and Plan CAD:  Diagnostic cath arranged with [name] [name] 4/25.  Hopefully left radial Orders done.  He does not want stent intervention Willing to have repeat CABG  Continue ASA and plavix hold metformin before  cath  DCM:  Marked change in EF a year ago EF 15%  EDP and V gram at cath.  Continue current meds  He does not want to consider AICD  Surgery:  Called [name] office and cancelled surgery for next week until heart issues sorted out  Chol:  Continue statin

## 2014-12-17 ENCOUNTER — Encounter: Payer: Self-pay | Admitting: Cardiovascular Disease

## 2014-12-17 ENCOUNTER — Encounter: Payer: Self-pay | Admitting: *Deleted

## 2014-12-17 ENCOUNTER — Ambulatory Visit (INDEPENDENT_AMBULATORY_CARE_PROVIDER_SITE_OTHER): Payer: BLUE CROSS/BLUE SHIELD | Admitting: Cardiovascular Disease

## 2014-12-17 ENCOUNTER — Other Ambulatory Visit: Payer: Self-pay | Admitting: Orthopedic Surgery

## 2014-12-17 VITALS — BP 123/70 | HR 88 | Wt 265.0 lb

## 2014-12-17 DIAGNOSIS — Z01818 Encounter for other preprocedural examination: Secondary | ICD-10-CM

## 2014-12-17 NOTE — Patient Instructions (Signed)
Your physician has requested that you have a cardiac catheterization. Cardiac catheterization is used to diagnose and/or treat various heart conditions. Doctors may recommend this procedure for a number of different reasons. The most common reason is to evaluate chest pain. Chest pain can be a symptom of coronary artery disease (CAD), and cardiac catheterization can show whether plaque is narrowing or blocking your heart's arteries. This procedure is also used to evaluate the valves, as well as measure the blood flow and oxygen levels in different parts of your heart. For further information please visit [email or web address]. Please follow instruction sheet, as given.  Your physician recommends that you continue on your current medications as directed. Please refer to the Current Medication list given to you today.  Thank you for choosing [facility]!

## 2014-12-18 ENCOUNTER — Other Ambulatory Visit (HOSPITAL_COMMUNITY): Payer: Self-pay | Admitting: *Deleted

## 2014-12-18 ENCOUNTER — Other Ambulatory Visit: Payer: Self-pay | Admitting: Cardiovascular Disease

## 2014-12-18 DIAGNOSIS — I255 Ischemic cardiomyopathy: Secondary | ICD-10-CM

## 2014-12-18 LAB — CBC WITH DIFFERENTIAL/PLATELET
Basophils Absolute: 0 10*3/uL (ref 0.0–0.1)
Basophils Relative: 0 % (ref 0–1)
Eosinophils Absolute: 0.1 10*3/uL (ref 0.0–0.7)
Eosinophils Relative: 1 % (ref 0–5)
HEMATOCRIT: 45.4 % (ref 39.0–52.0)
Hemoglobin: 15.4 g/dL (ref 13.0–17.0)
LYMPHS PCT: 18 % (ref 12–46)
Lymphs Abs: 1 10*3/uL (ref 0.7–4.0)
MCH: 31.3 pg (ref 26.0–34.0)
MCHC: 33.9 g/dL (ref 30.0–36.0)
MCV: 92.3 fL (ref 78.0–100.0)
MONO ABS: 0.8 10*3/uL (ref 0.1–1.0)
MONOS PCT: 14 % — AB (ref 3–12)
MPV: 10.3 fL (ref 8.6–12.4)
Neutro Abs: 3.9 10*3/uL (ref 1.7–7.7)
Neutrophils Relative %: 67 % (ref 43–77)
Platelets: 214 10*3/uL (ref 150–400)
RBC: 4.92 MIL/uL (ref 4.22–5.81)
RDW: 15.1 % (ref 11.5–15.5)
WBC: 5.8 10*3/uL (ref 4.0–10.5)

## 2014-12-18 LAB — BASIC METABOLIC PANEL
BUN: 11 mg/dL (ref 6–23)
CHLORIDE: 102 meq/L (ref 96–112)
CO2: 26 mEq/L (ref 19–32)
Calcium: 9.6 mg/dL (ref 8.4–10.5)
Creat: 0.81 mg/dL (ref 0.50–1.35)
GLUCOSE: 106 mg/dL — AB (ref 70–99)
Potassium: 4.2 mEq/L (ref 3.5–5.3)
Sodium: 138 mEq/L (ref 135–145)

## 2014-12-18 LAB — PROTIME-INR
INR: 1.13 (ref <1.50)
PROTHROMBIN TIME: 14.5 s (ref 11.6–15.2)

## 2014-12-18 LAB — APTT: aPTT: 35 seconds (ref 24–37)

## 2014-12-20 DIAGNOSIS — G4733 Obstructive sleep apnea (adult) (pediatric): Secondary | ICD-10-CM | POA: Diagnosis not present

## 2014-12-20 DIAGNOSIS — Z7982 Long term (current) use of aspirin: Secondary | ICD-10-CM | POA: Diagnosis not present

## 2014-12-20 DIAGNOSIS — Z888 Allergy status to other drugs, medicaments and biological substances status: Secondary | ICD-10-CM | POA: Diagnosis not present

## 2014-12-20 DIAGNOSIS — Z951 Presence of aortocoronary bypass graft: Secondary | ICD-10-CM | POA: Diagnosis not present

## 2014-12-20 DIAGNOSIS — I25810 Atherosclerosis of coronary artery bypass graft(s) without angina pectoris: Secondary | ICD-10-CM | POA: Diagnosis not present

## 2014-12-20 DIAGNOSIS — Z955 Presence of coronary angioplasty implant and graft: Secondary | ICD-10-CM | POA: Diagnosis not present

## 2014-12-20 DIAGNOSIS — Z6838 Body mass index (BMI) 38.0-38.9, adult: Secondary | ICD-10-CM | POA: Diagnosis not present

## 2014-12-20 DIAGNOSIS — I2582 Chronic total occlusion of coronary artery: Secondary | ICD-10-CM | POA: Diagnosis not present

## 2014-12-20 DIAGNOSIS — E669 Obesity, unspecified: Secondary | ICD-10-CM | POA: Diagnosis not present

## 2014-12-20 DIAGNOSIS — I252 Old myocardial infarction: Secondary | ICD-10-CM | POA: Diagnosis not present

## 2014-12-20 DIAGNOSIS — I2510 Atherosclerotic heart disease of native coronary artery without angina pectoris: Secondary | ICD-10-CM | POA: Diagnosis not present

## 2014-12-20 DIAGNOSIS — I509 Heart failure, unspecified: Secondary | ICD-10-CM | POA: Diagnosis not present

## 2014-12-20 DIAGNOSIS — I255 Ischemic cardiomyopathy: Secondary | ICD-10-CM | POA: Diagnosis present

## 2014-12-20 MED ORDER — POVIDONE-IODINE 7.5 % EX SOLN
Freq: Once | CUTANEOUS | Status: DC
  Filled 2014-12-20: qty 118

## 2014-12-20 MED ORDER — SODIUM CHLORIDE 0.9 % IV SOLN: 250.0000 mL | INTRAVENOUS | Status: DC | PRN

## 2014-12-20 MED ORDER — SODIUM CHLORIDE 0.9 % IJ SOLN: 3.0000 mL | Freq: Two times a day (BID) | INTRAMUSCULAR | Status: DC

## 2014-12-20 MED ORDER — SODIUM CHLORIDE 0.9 % IJ SOLN: 3.0000 mL | INTRAMUSCULAR | Status: DC | PRN

## 2014-12-20 MED ORDER — ASPIRIN 81 MG PO CHEW: 81.0000 mg | CHEWABLE_TABLET | ORAL | Status: DC

## 2014-12-20 MED ORDER — DEXTROSE 5 % IV SOLN
3.0000 g | INTRAVENOUS | Status: DC
  Filled 2014-12-20: qty 3000

## 2014-12-21 ENCOUNTER — Ambulatory Visit: Payer: BLUE CROSS/BLUE SHIELD | Admitting: Cardiology

## 2014-12-21 ENCOUNTER — Ambulatory Visit (HOSPITAL_COMMUNITY)
Admission: RE | Admit: 2014-12-21 | Discharge: 2014-12-21 | Disposition: A | Discharge status: Home / Self Care | Payer: BLUE CROSS/BLUE SHIELD | Source: Ambulatory Visit | Attending: Interventional Cardiology | Admitting: Interventional Cardiology

## 2014-12-21 ENCOUNTER — Encounter (HOSPITAL_COMMUNITY)
Admission: RE | Disposition: A | Discharge status: Home / Self Care | Payer: Self-pay | Source: Ambulatory Visit | Attending: Interventional Cardiology

## 2014-12-21 ENCOUNTER — Encounter (HOSPITAL_COMMUNITY): Payer: Self-pay | Admitting: Interventional Cardiology

## 2014-12-21 DIAGNOSIS — I5042 Chronic combined systolic (congestive) and diastolic (congestive) heart failure: Secondary | ICD-10-CM

## 2014-12-21 DIAGNOSIS — I25810 Atherosclerosis of coronary artery bypass graft(s) without angina pectoris: Secondary | ICD-10-CM | POA: Insufficient documentation

## 2014-12-21 DIAGNOSIS — Z888 Allergy status to other drugs, medicaments and biological substances status: Secondary | ICD-10-CM | POA: Insufficient documentation

## 2014-12-21 DIAGNOSIS — G4733 Obstructive sleep apnea (adult) (pediatric): Secondary | ICD-10-CM | POA: Insufficient documentation

## 2014-12-21 DIAGNOSIS — I2510 Atherosclerotic heart disease of native coronary artery without angina pectoris: Secondary | ICD-10-CM | POA: Diagnosis not present

## 2014-12-21 DIAGNOSIS — Z7982 Long term (current) use of aspirin: Secondary | ICD-10-CM | POA: Insufficient documentation

## 2014-12-21 DIAGNOSIS — Z955 Presence of coronary angioplasty implant and graft: Secondary | ICD-10-CM | POA: Insufficient documentation

## 2014-12-21 DIAGNOSIS — Z6838 Body mass index (BMI) 38.0-38.9, adult: Secondary | ICD-10-CM | POA: Insufficient documentation

## 2014-12-21 DIAGNOSIS — I252 Old myocardial infarction: Secondary | ICD-10-CM | POA: Insufficient documentation

## 2014-12-21 DIAGNOSIS — I509 Heart failure, unspecified: Secondary | ICD-10-CM | POA: Insufficient documentation

## 2014-12-21 DIAGNOSIS — I2582 Chronic total occlusion of coronary artery: Secondary | ICD-10-CM | POA: Insufficient documentation

## 2014-12-21 DIAGNOSIS — E669 Obesity, unspecified: Secondary | ICD-10-CM | POA: Insufficient documentation

## 2014-12-21 DIAGNOSIS — I255 Ischemic cardiomyopathy: Secondary | ICD-10-CM

## 2014-12-21 DIAGNOSIS — Z951 Presence of aortocoronary bypass graft: Secondary | ICD-10-CM | POA: Insufficient documentation

## 2014-12-21 HISTORY — PX: LEFT HEART CATHETERIZATION WITH CORONARY/GRAFT ANGIOGRAM: SHX5450

## 2014-12-21 LAB — GLUCOSE, CAPILLARY: Glucose-Capillary: 117 mg/dL — ABNORMAL HIGH (ref 70–99)

## 2014-12-21 SURGERY — LEFT HEART CATHETERIZATION WITH CORONARY/GRAFT ANGIOGRAM
Anesthesia: LOCAL

## 2014-12-21 MED ORDER — ONDANSETRON HCL 4 MG/2ML IJ SOLN: 4.0000 mg | Freq: Four times a day (QID) | INTRAMUSCULAR | Status: DC | PRN

## 2014-12-21 MED ORDER — MIDAZOLAM HCL 2 MG/2ML IJ SOLN
INTRAMUSCULAR | Status: AC
  Filled 2014-12-21: qty 2

## 2014-12-21 MED ORDER — HEPARIN (PORCINE) IN NACL 2-0.9 UNIT/ML-% IJ SOLN
INTRAMUSCULAR | Status: AC
  Filled 2014-12-21: qty 1000

## 2014-12-21 MED ORDER — SODIUM CHLORIDE 0.9 % IV SOLN
INTRAVENOUS | Status: DC
Start: 2014-12-21 — End: 2014-12-21
  Administered 2014-12-21: 07:00:00 via INTRAVENOUS

## 2014-12-21 MED ORDER — ACETAMINOPHEN 325 MG PO TABS: 650.0000 mg | ORAL_TABLET | ORAL | Status: DC | PRN

## 2014-12-21 MED ORDER — SODIUM CHLORIDE 0.9 % IV SOLN: 1.0000 mL/kg/h | INTRAVENOUS | Status: DC

## 2014-12-21 MED ORDER — LIDOCAINE HCL (PF) 1 % IJ SOLN
INTRAMUSCULAR | Status: AC
  Filled 2014-12-21: qty 30

## 2014-12-21 MED ORDER — FENTANYL CITRATE (PF) 100 MCG/2ML IJ SOLN
INTRAMUSCULAR | Status: AC
  Filled 2014-12-21: qty 2

## 2014-12-21 MED ORDER — VERAPAMIL HCL 2.5 MG/ML IV SOLN
INTRAVENOUS | Status: AC
  Filled 2014-12-21: qty 2

## 2014-12-21 MED ORDER — OXYCODONE-ACETAMINOPHEN 5-325 MG PO TABS: 1.0000 | ORAL_TABLET | ORAL | Status: DC | PRN

## 2014-12-21 MED ORDER — HEPARIN SODIUM (PORCINE) 1000 UNIT/ML IJ SOLN
INTRAMUSCULAR | Status: AC
  Filled 2014-12-21: qty 1

## 2014-12-21 NOTE — Discharge Instructions (Signed)
Radial Site Care °Refer to this sheet in the next few weeks. These instructions provide you with information on caring for yourself after your procedure. Your caregiver may also give you more specific instructions. Your treatment has been planned according to current medical practices, but problems sometimes occur. Call your caregiver if you have any problems or questions after your procedure. °HOME CARE INSTRUCTIONS °· You may shower the day after the procedure. Remove the bandage (dressing) and gently wash the site with plain soap and water. Gently pat the site dry. °· Do not apply powder or lotion to the site. °· Do not submerge the affected site in water for 3 to 5 days. °· Inspect the site at least twice daily. °· Do not flex or bend the affected arm for 24 hours. °· No lifting over 5 pounds (2.3 kg) for 5 days after your procedure. °· Do not drive home if you are discharged the same day of the procedure. Have someone else drive you. °· You may drive 24 hours after the procedure unless otherwise instructed by your caregiver. °· Do not operate machinery or power tools for 24 hours. °· A responsible adult should be with you for the first 24 hours after you arrive home. °What to expect: °· Any bruising will usually fade within 1 to 2 weeks. °· Blood that collects in the tissue (hematoma) may be painful to the touch. It should usually decrease in size and tenderness within 1 to 2 weeks. °SEEK IMMEDIATE MEDICAL CARE IF: °· You have unusual pain at the radial site. °· You have redness, warmth, swelling, or pain at the radial site. °· You have drainage (other than a small amount of blood on the dressing). °· You have chills. °· You have a fever or persistent symptoms for more than 72 hours. °· You have a fever and your symptoms suddenly get worse. °· Your arm becomes pale, cool, tingly, or numb. °· You have heavy bleeding from the site. Hold pressure on the site and call 911. °Document Released: 09/16/2010 Document  Revised: 11/06/2011 Document Reviewed: 09/16/2010 °Exit[employer]® Patient Information ©2015 [employer]. This information is not intended to replace advice given to you by your health care provider. Make sure you discuss any questions you have with your health care provider. ° °

## 2014-12-21 NOTE — H&P (View-Only) (Signed)
Patient ID: [name] [name], male   DOB: 08/24/1951, 64 y.o.   MRN: [id number]  Primary Cardiologist:  [name] is a 64 yo with large left rotator cuff tear in need of surgery He has an extensive   history of CAD He is s/p CABG x 6 in 2001  (leftinternal mammary artery to distal left anterior descending coronary artery,  saphenous vein graft to ramus intermediate branch, saphenous vein graft to  second circumflex marginal branch and sequential saphenous vein graft to right  posterolateral branch, saphenous vein graft to acute marginal branch, and  sequential saphenous vein graft to posterior descending coronary artery).   He suffered NSTEMI in 2009 He underwent PTCA/BMS to prox an mid SVGs to OM2 and PLA as well as PTCA/PROMUS stent to prox/mid vein graft to diag and vein graft to ramus. Last cath 2012 medical Rx EF 49%  Poor distal runoff in OM and PDA/PLA grafts   Seen by Dr [name] 2015 with CHF symptoms Rx with lasix  Echo 11/07/13 reviewed and EF 15%  Mild RV enlargement  Was encouraged to f/u but did not and meds adjusted.    He is very adamant about being done wrong with too many stents.  He has not had clinical CHF since seeing Dr [name].  He clearly states that he does not want AICD.  I told him I could not Clear him to have shoulder surgery given his obesity with unRx OSA, airway issues and severe ischemic DCM.  After much discussion he is willing to have diagnostic heart cath to see Wear he stands.  Does not want intervention or stents.    Hopefully interventionalist can do from left radial approach.  Risks including stroke, death, bleeding and emergency surgery discussed.  He did indicate he would be willing to have repeat CABG if needed but not any stents   ROS: Denies fever, malais, weight loss, blurry vision, decreased visual acuity, cough, sputum, SOB, hemoptysis, pleuritic pain, palpitaitons, heartburn, abdominal pain, melena, lower extremity edema, claudication, or  rash.  All other systems reviewed and negative  General: Affect appropriate Obese white male  HEENT: normal Neck supple with no adenopathy JVP normal no bruits no thyromegaly Lungs clear with no wheezing and good diaphragmatic motion Heart:  S1/S2 no murmur, no rub, gallop or click PMI normal Abdomen: benighn, BS positve, no tenderness, no AAA no bruit.  No HSM or HJR Distal pulses intact with no bruits No edema Neuro non-focal Skin warm and dry Left arm weakness with decreased ROM     Current Outpatient Prescriptions  Medication Sig Dispense Refill  . aspirin 325 MG tablet Take 325 mg by mouth daily.    . clopidogrel (PLAVIX) 75 MG tablet TAKE ONE TABLET BY MOUTH DAILY. 30 tablet 0  . furosemide (LASIX) 40 MG tablet TAKE ONE TABLET DAILY EVERY MORNING. 30 tablet 1  . HYDROcodone-acetaminophen (NORCO) 10-325 MG per tablet Take 1 tablet by mouth every 4 (four) hours as needed. 30 tablet 0  . lisinopril (PRINIVIL,ZESTRIL) 5 MG tablet TAKE ONE TABLET BY MOUTH ONCE DAILY. 30 tablet 1  . metFORMIN (GLUCOPHAGE-XR) 500 MG 24 hr tablet Take 1 tablet (500 mg total) by mouth daily with breakfast. 30 tablet 2  . metoprolol tartrate (LOPRESSOR) 25 MG tablet TAKE ONE TABLET BY MOUTH TWICE DAILY. 60 tablet 0  . Multiple Vitamins-Minerals (CENTRUM SILVER PO) Take by mouth.    Marland Kitchen NITROSTAT 0.4 MG SL tablet PLACE 1 TABLET UNDER TONGUE FOR CHEST PAIN.  MAY REPEAT EVERY 5 MIN UPTO 3 DOSES-NO RELIEF,CALL 911. 25 tablet 1  . rosuvastatin (CRESTOR) 40 MG tablet Take 1 tablet (40 mg total) by mouth daily. 90 tablet 3   No current facility-administered medications for this visit.    Allergies  Dyflex-g  Electrocardiogram:  10/28/13  SR low voltage/ poor R wave progression PVC  Assessment and Plan CAD:  Diagnostic cath arranged with Dr [name] 4/25.  Hopefully left radial Orders done.  He does not want stent intervention Willing to have repeat CABG  Continue ASA and plavix hold metformin before  cath  DCM:  Marked change in EF a year ago EF 15%  EDP and V gram at cath.  Continue current meds  He does not want to consider AICD  Surgery:  Called [name] office and cancelled surgery for next week until heart issues sorted out  Chol:  Continue statin

## 2014-12-21 NOTE — CV Procedure (Signed)
     Left Heart Catheterization with Coronary and Bypass Angiography Report  [name] [name]  64 y.o.  male [birth year]  Procedure Date: 12/21/2014 Referring Physician: Jenkins Rouge, M.D. Primary Cardiologist: Dorris Carnes, M.D.  INDICATIONS: Ischemic cardiomyopathy with known EF of 15% and prior coronary bypass grafting with stents placed in all 3 vein grafts 2009. This study is requested by Dr. Johnsie Cancel to define graft anatomy. The patient would be adverse to having stents placed. He would consider repeat bypass surgery.  PROCEDURE: 1. Left heart catheterization; 2. Coronary angiography; 3. Left ventriculography, not performed due to inability to get a catheter into the left ventricle  CONSENT:  The risks, benefits, and details of the procedure were explained in detail to the patient. Risks including death, stroke, heart attack, kidney injury, allergy, limb ischemia, bleeding and radiation injury were discussed.  The patient verbalized understanding and wanted to proceed.  Informed written consent was obtained.  PROCEDURE TECHNIQUE:  After Xylocaine anesthesia a 5 French Slender sheath was placed in the left radial artery with an angiocath and the modified Seldinger technique.  Coronary angiography was done using a 5 F A2 MP, JR4, angled pigtail, A1 MP, JL 3.5 diagnostic catheter(s).  Left ventriculography was not done due to inability to cross the aortic valve using multiple catheters and wires. After 30 minutes, further attempts to cross the aortic valve were discontinued. The difficulty crossing is related to the short and angled aortic root with limited torque control from the left radial approach.  Hemostasis was achieved with a wrist band using 13 cc of air.   CONTRAST:  Total of 155 cc.  COMPLICATIONS:  None   HEMODYNAMICS:  Aortic pressure 115/80 mmHg; LV pressure not recorded; LVEDP not recorded  ANGIOGRAPHIC DATA:   The left main coronary artery is patent.  The left anterior  descending artery is totally occluded in the mid vessel. A large branching diagonal remains widely patent.  The left circumflex artery is totally occluded.  The right coronary artery is totally occluded.  BYPASS GRAFT ANGIOGRAPHY:  Left internal mammary to distal LAD: Widely patent SVG to ramus intermedius: Patent stent in the mid vessel. Proximal to the stent margin there is an eccentric 50-70% stenosis. SVG to the posterolateral branch: Diffusely diseased but widely patent graft. SVG sequential to acute marginal and PDA: Totally occluded limb to the PDA. Irregularities noted in the graft. Side to side anastomosis with the acute marginal branch is patent   LEFT VENTRICULOGRAM:  Left ventricular angiogram was not performed due to inability to cross the aortic valve into the LV. It is not felt that aortic stenosis is present but rather adverse anatomy that prevented entry.   IMPRESSIONS:  1. Severe native vessel coronary disease with total occlusion of the RCA, circumflex, and mid LAD. 2. Bypass graft occlusive disease with occlusion of the PDA limb of the sequential graft to acute marginal/PDA and 50-70% stenosis proximal to the stent in the ramus intermedius. Otherwise, patent bypass grafts including LIMA to LAD, SVG to ramus, SVG to posterolateral branch, and SVG to acute marginal. 3. Unfortunately, we will unable to cross into the left ventricle. Patient is known to have decreased LV systolic function. EF by echocardiogram 1 year ago was 15%.   RECOMMENDATION:  Per Dr. Altamese Crandall. Consider repeat echocardiogram

## 2014-12-21 NOTE — Interval H&P Note (Signed)
Cath Lab Visit (complete for each Cath Lab visit)  Clinical Evaluation Leading to the Procedure:   ACS: No.  Non-ACS:    Anginal Classification: CCS II  Anti-ischemic medical therapy: Maximal Therapy (2 or more classes of medications)  Non-Invasive Test Results: No non-invasive testing performed  Prior CABG: Previous CABG      History and Physical Interval Note:  12/21/2014 7:15 AM  [name]  has presented today for surgery, with the diagnosis of cm  The various methods of treatment have been discussed with the patient and family. After consideration of risks, benefits and other options for treatment, the patient has consented to  Procedure(s): LEFT HEART CATHETERIZATION WITH CORONARY/GRAFT ANGIOGRAM (N/A) as a surgical intervention .  The patient's history has been reviewed, patient examined, no change in status, stable for surgery.  I have reviewed the patient's chart and labs.  Questions were answered to the patient's satisfaction.     Sinclair Grooms

## 2014-12-22 ENCOUNTER — Telehealth: Payer: Self-pay | Admitting: *Deleted

## 2014-12-22 NOTE — Telephone Encounter (Signed)
[facility] with ortho surgical center. Dr. Nicki Reaper wants to know what type of surgery pt is having. See chart in nurse Basket.

## 2014-12-24 NOTE — Telephone Encounter (Signed)
Patient was having shoulder rotator cuff surgery but did not receive cardiac clearance for surgery so surgery has been cancelled for now. Patient to follow up with cardiology next week for further discussion.

## 2014-12-25 ENCOUNTER — Other Ambulatory Visit: Payer: Self-pay | Admitting: Internal Medicine

## 2014-12-25 ENCOUNTER — Other Ambulatory Visit: Payer: Self-pay | Admitting: Family Medicine

## 2014-12-25 ENCOUNTER — Ambulatory Visit: Payer: BLUE CROSS/BLUE SHIELD | Admitting: Cardiovascular Disease

## 2014-12-28 ENCOUNTER — Ambulatory Visit (HOSPITAL_BASED_OUTPATIENT_CLINIC_OR_DEPARTMENT_OTHER)
Admission: RE | Admit: 2014-12-28 | Payer: BLUE CROSS/BLUE SHIELD | Source: Ambulatory Visit | Admitting: Orthopedic Surgery

## 2014-12-28 ENCOUNTER — Encounter (HOSPITAL_BASED_OUTPATIENT_CLINIC_OR_DEPARTMENT_OTHER): Admission: RE | Payer: Self-pay | Source: Ambulatory Visit

## 2014-12-28 SURGERY — SHOULDER ARTHROSCOPY WITH ROTATOR CUFF REPAIR AND SUBACROMIAL DECOMPRESSION
Anesthesia: Choice | Laterality: Left

## 2015-01-01 ENCOUNTER — Ambulatory Visit (INDEPENDENT_AMBULATORY_CARE_PROVIDER_SITE_OTHER): Payer: BLUE CROSS/BLUE SHIELD | Admitting: Family Medicine

## 2015-01-01 ENCOUNTER — Encounter: Payer: Self-pay | Admitting: Family Medicine

## 2015-01-01 VITALS — BP 130/82 | Ht 72.0 in | Wt 282.4 lb

## 2015-01-01 DIAGNOSIS — M545 Low back pain, unspecified: Secondary | ICD-10-CM

## 2015-01-01 DIAGNOSIS — M25571 Pain in right ankle and joints of right foot: Secondary | ICD-10-CM

## 2015-01-01 DIAGNOSIS — R972 Elevated prostate specific antigen [PSA]: Secondary | ICD-10-CM

## 2015-01-01 DIAGNOSIS — G8929 Other chronic pain: Secondary | ICD-10-CM | POA: Insufficient documentation

## 2015-01-01 DIAGNOSIS — R7309 Other abnormal glucose: Secondary | ICD-10-CM

## 2015-01-01 DIAGNOSIS — E785 Hyperlipidemia, unspecified: Secondary | ICD-10-CM | POA: Diagnosis not present

## 2015-01-01 DIAGNOSIS — R7303 Prediabetes: Secondary | ICD-10-CM

## 2015-01-01 MED ORDER — HYDROCODONE-ACETAMINOPHEN 10-325 MG PO TABS: 1.0000 | ORAL_TABLET | ORAL | Status: DC | PRN

## 2015-01-01 NOTE — Progress Notes (Signed)
   Subjective:    Patient ID: [name] [name], male    DOB: 1951-05-24, 64 y.o.   MRN: [id number]  HPI This patient was seen today for chronic pain  The medication list was reviewed and updated.   -Compliance with pain medication: yes-normally gets 45 a month  The patient was advised the importance of maintaining medication and not using illegal substances with these.  Refills needed: yes  The patient was educated that we can provide 3 monthly scripts for their medication, it is their responsibility to follow the instructions.  Side effects or complications from medications: no  Patient is aware that pain medications are meant to minimize the severity of the pain to allow their pain levels to improve to allow for better function. They are aware of that pain medications cannot totally remove their pain.  Due for UDT ( at least once per year) : next visit  This patient does state that he tries watch to degree what he eats he does not exercise on a regular basis but he does stay physically active he currently has some heart disease that cardiology is evaluating. He also has history of elevated PSA prediabetes and hyperlipidemia. He relates compliance with his medications. He denies chest tightness pressure pain denies abdominal pain. He does have left shoulder pain and right ankle pain. Recently cardiology stated that he was not a good candidate for surgery      Review of Systems  Constitutional: Negative for activity change.  Gastrointestinal: Negative for vomiting and abdominal pain.  Neurological: Negative for weakness.  Psychiatric/Behavioral: Negative for confusion.       Objective:   Physical Exam  Constitutional: He appears well-nourished.  Cardiovascular: Normal rate, regular rhythm and normal heart sounds.   No murmur heard. Pulmonary/Chest: Effort normal and breath sounds normal.  Musculoskeletal: He exhibits no edema.  Lymphadenopathy:    He has no cervical  adenopathy.  Neurological: He is alert.  Psychiatric: His behavior is normal.  Vitals reviewed.    Patient with intermittent low back pain discomfort comes and goes does not radiate down the leg. Does not wake him up at night.     Assessment & Plan:  History of elevated PSA-check PSA level await the result. Patient in the past is not been interested in doing further intervention  Cardiac issues follow-up with cardiology  Chronic pain related to the lumbar back as well as right ankle previous injury. He states he uses hydrocodone only sparingly. Given a prescription for 60 follow-up periodically  25 minutes spent with patient discussing multiple issues  Prediabetes watch starches in diet check A1c Hyperlipidemia check lipid liver profile Patient recently had metabolic 7 at the hospital it was normal no need to recheck that currently Follow-up 6 months

## 2015-01-02 ENCOUNTER — Other Ambulatory Visit: Payer: Self-pay | Admitting: Family Medicine

## 2015-01-02 LAB — HEPATIC FUNCTION PANEL
ALBUMIN: 4 g/dL (ref 3.5–5.2)
ALT: 12 U/L (ref 0–53)
AST: 15 U/L (ref 0–37)
Alkaline Phosphatase: 62 U/L (ref 39–117)
BILIRUBIN DIRECT: 0.2 mg/dL (ref 0.0–0.3)
Indirect Bilirubin: 0.5 mg/dL (ref 0.2–1.2)
TOTAL PROTEIN: 7.1 g/dL (ref 6.0–8.3)
Total Bilirubin: 0.7 mg/dL (ref 0.2–1.2)

## 2015-01-02 LAB — LIPID PANEL
CHOL/HDL RATIO: 3.9 ratio
Cholesterol: 118 mg/dL (ref 0–200)
HDL: 30 mg/dL — AB (ref >=40)
LDL CALC: 57 mg/dL (ref 0–99)
Triglycerides: 157 mg/dL — ABNORMAL HIGH (ref <150)
VLDL: 31 mg/dL (ref 0–40)

## 2015-01-03 LAB — HEMOGLOBIN A1C
Hgb A1c MFr Bld: 6.3 % — ABNORMAL HIGH (ref <5.7)
Mean Plasma Glucose: 134 mg/dL — ABNORMAL HIGH (ref <117)

## 2015-01-04 LAB — PSA: PSA: 6.75 ng/mL — ABNORMAL HIGH (ref <=4.00)

## 2015-01-07 ENCOUNTER — Other Ambulatory Visit: Payer: Self-pay

## 2015-01-07 ENCOUNTER — Telehealth: Payer: Self-pay | Admitting: *Deleted

## 2015-01-07 DIAGNOSIS — R972 Elevated prostate specific antigen [PSA]: Secondary | ICD-10-CM

## 2015-01-07 NOTE — Telephone Encounter (Signed)
he I discussed the results with the wife. She is aware that the elevated PSA needs looking into. Currently he has seen cardiologist because of severe coronary artery disease. Once this situation gets improved she will call back and have [place] set the patient up with urology. She understood that this elevated PSA could be a sign of cancer but again may not

## 2015-01-07 NOTE — Telephone Encounter (Signed)
Dr. Nicki Reaper spoke with pt about test results.

## 2015-02-10 ENCOUNTER — Other Ambulatory Visit: Payer: Self-pay | Admitting: Family Medicine

## 2015-02-22 ENCOUNTER — Other Ambulatory Visit: Payer: Self-pay

## 2015-04-06 ENCOUNTER — Other Ambulatory Visit: Payer: Self-pay | Admitting: Family Medicine

## 2015-07-05 ENCOUNTER — Ambulatory Visit (INDEPENDENT_AMBULATORY_CARE_PROVIDER_SITE_OTHER): Payer: BLUE CROSS/BLUE SHIELD | Admitting: Family Medicine

## 2015-07-05 ENCOUNTER — Encounter: Payer: Self-pay | Admitting: Family Medicine

## 2015-07-05 VITALS — BP 118/68 | Ht 70.5 in | Wt 275.0 lb

## 2015-07-05 DIAGNOSIS — R7303 Prediabetes: Secondary | ICD-10-CM | POA: Diagnosis not present

## 2015-07-05 DIAGNOSIS — H539 Unspecified visual disturbance: Secondary | ICD-10-CM | POA: Diagnosis not present

## 2015-07-05 DIAGNOSIS — Z23 Encounter for immunization: Secondary | ICD-10-CM | POA: Diagnosis not present

## 2015-07-05 DIAGNOSIS — E785 Hyperlipidemia, unspecified: Secondary | ICD-10-CM | POA: Diagnosis not present

## 2015-07-05 DIAGNOSIS — R972 Elevated prostate specific antigen [PSA]: Secondary | ICD-10-CM | POA: Diagnosis not present

## 2015-07-05 DIAGNOSIS — R634 Abnormal weight loss: Secondary | ICD-10-CM | POA: Diagnosis not present

## 2015-07-05 LAB — POCT GLYCOSYLATED HEMOGLOBIN (HGB A1C): Hemoglobin A1C: 5.9

## 2015-07-05 MED ORDER — HYDROCODONE-ACETAMINOPHEN 10-325 MG PO TABS: 1.0000 | ORAL_TABLET | ORAL | Status: DC | PRN

## 2015-07-05 MED ORDER — METOPROLOL TARTRATE 25 MG PO TABS: 25.0000 mg | ORAL_TABLET | Freq: Two times a day (BID) | ORAL | Status: DC

## 2015-07-05 MED ORDER — ROSUVASTATIN CALCIUM 40 MG PO TABS: 40.0000 mg | ORAL_TABLET | Freq: Every day | ORAL | Status: DC

## 2015-07-05 MED ORDER — METFORMIN HCL ER 500 MG PO TB24: ORAL_TABLET | ORAL | Status: DC

## 2015-07-05 MED ORDER — LISINOPRIL 5 MG PO TABS: 5.0000 mg | ORAL_TABLET | Freq: Every day | ORAL | Status: DC

## 2015-07-05 MED ORDER — FUROSEMIDE 40 MG PO TABS: ORAL_TABLET | ORAL | Status: DC

## 2015-07-05 NOTE — Progress Notes (Signed)
   Subjective:    Patient ID: [name] [name], male    DOB: 03-15-1951, 64 y.o.   MRN: [id number]  Hyperlipidemia This is a chronic problem. The current episode started more than 1 year ago. Pertinent negatives include no chest pain.  pt is not exercising and has concerns about weight loss.  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 Started about one month ago. Wife is making an appt with eye dr.  Hazel [name] refills on all meds.  Prediabetes A1C today 5.9. Needs refill on hydrocodone. Takes for foot pain and back pain.  Has concerns about flu vaccine and pneumonia vaccine. Pt states he had pneumonia 5 years ago and is due again in January. He wants to do today if possible.  Long discussion held with the patient today. Still has intermittent back pain for pain uses hydrocodone sparingly requests a refill today He tries watch his diet he takes his medicine. He denies any chest tightness pressure pain Patient states he has been losing some weight he isn't sure if this is because he's eating healthier or because of something going on.  Review of Systems  Constitutional: Negative for activity change, appetite change and fatigue.  HENT: Negative for congestion.   Respiratory: Negative for cough.   Cardiovascular: Negative for chest pain.  Gastrointestinal: Negative for abdominal pain.  Endocrine: Negative for polydipsia and polyphagia.  Neurological: Negative for weakness.  Psychiatric/Behavioral: Negative for confusion.       Objective:   Physical Exam  Constitutional: He appears well-nourished. No distress.  Cardiovascular: Normal rate, regular rhythm and normal heart sounds.   No murmur heard. Pulmonary/Chest: Effort normal and breath sounds normal. No respiratory distress.  Musculoskeletal: He exhibits no edema.  Lymphadenopathy:    He has no cervical adenopathy.  Neurological: He is alert.  Psychiatric: His behavior is normal.  Vitals reviewed.         Assessment & Plan:  1.  Prediabetes Prediabetes decent control watch diet closely stay physically active. Check A1c. - POCT glycosylated hemoglobin (Hb A1C) - Lipid panel - Basic metabolic panel - PSA - Hemoglobin A1c - PSA, total and free  2. Elevated PSA Patient needs PSA recheck. I am concerned about this patient has a elevated PSA I advised him to go see urology. He states he saw urology and they didn't do anything for. We will repeat his lab work. Patient may need biopsy. - Lipid panel - Basic metabolic panel - PSA - Hemoglobin A1c - PSA, total and free  3. Hyperlipemia Check lipid profile. Watch diet closely. Continue medication. - Lipid panel - Basic metabolic panel - PSA - Hemoglobin A1c - PSA, total and free  4. Vision changes Referral to ophthalmology patient having significant vision changes could be cataract could be retinopathy - Ambulatory referral to Ophthalmology  5. Need for vaccination Pneumonia vaccine today - Pneumococcal conjugate vaccine 13-valent IM  6. Encounter for immunization Flu vaccine today 25 minutes was spent with the patient. Greater than half the time was spent in discussion and answering questions and counseling regarding the issues that the patient came in for today.  We will also arrange for the patient have CBC as well as Hemoccult cards 3.

## 2015-07-06 NOTE — Addendum Note (Signed)
Addended by: Jesusita Oka on: 07/06/2015 09:50 AM   Modules accepted: Orders

## 2015-07-07 LAB — CBC WITH DIFFERENTIAL/PLATELET
Basophils Absolute: 0 10*3/uL (ref 0.0–0.2)
Basos: 0 %
EOS (ABSOLUTE): 0.1 10*3/uL (ref 0.0–0.4)
Eos: 1 %
HEMATOCRIT: 46.2 % (ref 37.5–51.0)
Hemoglobin: 15.9 g/dL (ref 12.6–17.7)
IMMATURE GRANS (ABS): 0.1 10*3/uL (ref 0.0–0.1)
IMMATURE GRANULOCYTES: 1 %
LYMPHS ABS: 1.4 10*3/uL (ref 0.7–3.1)
Lymphs: 15 %
MCH: 31.7 pg (ref 26.6–33.0)
MCHC: 34.4 g/dL (ref 31.5–35.7)
MCV: 92 fL (ref 79–97)
MONOCYTES: 11 %
MONOS ABS: 1 10*3/uL — AB (ref 0.1–0.9)
NEUTROS ABS: 6.5 10*3/uL (ref 1.4–7.0)
Neutrophils: 72 %
PLATELETS: 245 10*3/uL (ref 150–379)
RBC: 5.02 x10E6/uL (ref 4.14–5.80)
RDW: 13.2 % (ref 12.3–15.4)
WBC: 9.1 10*3/uL (ref 3.4–10.8)

## 2015-07-07 LAB — BASIC METABOLIC PANEL
BUN/Creatinine Ratio: 13 (ref 10–22)
BUN: 11 mg/dL (ref 8–27)
CALCIUM: 9.5 mg/dL (ref 8.6–10.2)
CO2: 27 mmol/L (ref 18–29)
CREATININE: 0.86 mg/dL (ref 0.76–1.27)
Chloride: 98 mmol/L (ref 97–106)
GFR calc Af Amer: 106 mL/min/{1.73_m2} (ref >59)
GFR, EST NON AFRICAN AMERICAN: 92 mL/min/{1.73_m2} (ref >59)
GLUCOSE: 101 mg/dL — AB (ref 65–99)
Potassium: 4.2 mmol/L (ref 3.5–5.2)
Sodium: 141 mmol/L (ref 136–144)

## 2015-07-07 LAB — LIPID PANEL
Chol/HDL Ratio: 5.6 ratio units — ABNORMAL HIGH (ref 0.0–5.0)
Cholesterol, Total: 207 mg/dL — ABNORMAL HIGH (ref 100–199)
HDL: 37 mg/dL — ABNORMAL LOW (ref >39)
LDL Calculated: 128 mg/dL — ABNORMAL HIGH (ref 0–99)
Triglycerides: 212 mg/dL — ABNORMAL HIGH (ref 0–149)
VLDL Cholesterol Cal: 42 mg/dL — ABNORMAL HIGH (ref 5–40)

## 2015-07-07 LAB — PSA, TOTAL AND FREE
PROSTATE SPECIFIC AG, SERUM: 7.4 ng/mL — AB (ref 0.0–4.0)
PSA FREE PCT: 19.2 %
PSA FREE: 1.42 ng/mL

## 2015-07-07 LAB — HEMOGLOBIN A1C
ESTIMATED AVERAGE GLUCOSE: 131 mg/dL
HEMOGLOBIN A1C: 6.2 % — AB (ref 4.8–5.6)

## 2015-07-08 ENCOUNTER — Telehealth: Payer: Self-pay | Admitting: Family Medicine

## 2015-07-08 NOTE — Telephone Encounter (Signed)
Patient requesting that you call him in regards to the hemoccult cards and the guidelines on the cards regarding his pain medication. Patient states that he wanted to know if you could adjust his pain medication. Tried to clarify situation with patient exactly what he was asking and he requested to only speak with you.

## 2015-07-08 NOTE — Telephone Encounter (Signed)
Spoke with patient to try to clarify the situation. Patient stated that he read the hemoccult cards and according to the guidelines he should not take Aspirin or his pain medication while doing stool samples. I informed patient that it would be fine to continue the medication while doing the hemoccult cards. Also informed patient that I discussed with Dr.Scott Luking in real time. Patient stated that he still prefers to talk with Doctor. Informed patient that I relayed information and situation with the Doctor and it is ok to continue mediations. Patient stated that he would still prefer to speak with the Doctor in regards to this.

## 2015-07-08 NOTE — Telephone Encounter (Signed)
Unfortunately it doesn't work that way he needs to clarify it with the nurses. In a very diplomatic way- I recommend you restate to him that you are my appointed  licensed assistant in that I have asked you to clarify what problems he is having. I will be more than happy to discuss with the nurse who clarifies directly in this can be related to the patient.

## 2015-07-08 NOTE — Telephone Encounter (Signed)
Patient advised to continued current meds and complete hemoccult cards as discussed. Patient stated he was not going to do the hemocult cards at this time

## 2015-07-26 ENCOUNTER — Other Ambulatory Visit: Payer: Self-pay

## 2015-07-26 DIAGNOSIS — R972 Elevated prostate specific antigen [PSA]: Secondary | ICD-10-CM

## 2015-08-03 ENCOUNTER — Encounter: Payer: Self-pay | Admitting: Family Medicine

## 2015-08-27 ENCOUNTER — Ambulatory Visit (INDEPENDENT_AMBULATORY_CARE_PROVIDER_SITE_OTHER): Payer: BLUE CROSS/BLUE SHIELD | Admitting: Urology

## 2015-08-27 DIAGNOSIS — R972 Elevated prostate specific antigen [PSA]: Secondary | ICD-10-CM | POA: Diagnosis not present

## 2015-08-27 DIAGNOSIS — N401 Enlarged prostate with lower urinary tract symptoms: Secondary | ICD-10-CM | POA: Diagnosis not present

## 2015-08-27 DIAGNOSIS — R351 Nocturia: Secondary | ICD-10-CM

## 2015-09-02 DIAGNOSIS — I708 Atherosclerosis of other arteries: Secondary | ICD-10-CM | POA: Diagnosis not present

## 2015-09-02 DIAGNOSIS — H25011 Cortical age-related cataract, right eye: Secondary | ICD-10-CM | POA: Diagnosis not present

## 2015-09-02 DIAGNOSIS — H2511 Age-related nuclear cataract, right eye: Secondary | ICD-10-CM | POA: Diagnosis not present

## 2015-09-02 DIAGNOSIS — H25012 Cortical age-related cataract, left eye: Secondary | ICD-10-CM | POA: Diagnosis not present

## 2015-09-02 DIAGNOSIS — H2512 Age-related nuclear cataract, left eye: Secondary | ICD-10-CM | POA: Diagnosis not present

## 2015-09-21 DIAGNOSIS — H2512 Age-related nuclear cataract, left eye: Secondary | ICD-10-CM | POA: Diagnosis not present

## 2015-10-01 ENCOUNTER — Telehealth: Payer: Self-pay | Admitting: Family Medicine

## 2015-10-01 NOTE — Telephone Encounter (Signed)
Notified wife Miralax one scoop three times next 24 hrs, three dulcolax tablets one time dose, and thirty cc's of milk of magnesia should get things going, if not may need to do an otc fleets enema. If pain worsens may need to got to ER. Wife verbalized understanding.

## 2015-10-01 NOTE — Telephone Encounter (Signed)
Miralax one scoop three times next 24 hrs, three dulcolax tablets one time dose, and thirty cc's of milk of magnesia should get things going, if not may need to do an otc fleets enema. If pain worsens may need to got to ER

## 2015-10-01 NOTE — Telephone Encounter (Signed)
Patient has not had a bowel movement all week.  It is at the point where it is causing a lot of pain and OTC medicines are not working.  Please advise.  [employer]

## 2015-10-11 DIAGNOSIS — H25011 Cortical age-related cataract, right eye: Secondary | ICD-10-CM | POA: Diagnosis not present

## 2015-10-11 DIAGNOSIS — H2511 Age-related nuclear cataract, right eye: Secondary | ICD-10-CM | POA: Diagnosis not present

## 2015-10-15 DIAGNOSIS — H2000 Unspecified acute and subacute iridocyclitis: Secondary | ICD-10-CM | POA: Diagnosis not present

## 2015-10-15 DIAGNOSIS — H16142 Punctate keratitis, left eye: Secondary | ICD-10-CM | POA: Diagnosis not present

## 2015-10-15 DIAGNOSIS — H16149 Punctate keratitis, unspecified eye: Secondary | ICD-10-CM | POA: Diagnosis not present

## 2015-10-19 DIAGNOSIS — H2511 Age-related nuclear cataract, right eye: Secondary | ICD-10-CM | POA: Diagnosis not present

## 2015-10-19 DIAGNOSIS — H16149 Punctate keratitis, unspecified eye: Secondary | ICD-10-CM | POA: Diagnosis not present

## 2015-10-19 DIAGNOSIS — H179 Unspecified corneal scar and opacity: Secondary | ICD-10-CM | POA: Diagnosis not present

## 2015-10-19 DIAGNOSIS — H2000 Unspecified acute and subacute iridocyclitis: Secondary | ICD-10-CM | POA: Diagnosis not present

## 2015-10-22 ENCOUNTER — Inpatient Hospital Stay (HOSPITAL_COMMUNITY)
Admission: EM | Admit: 2015-10-22 | Discharge: 2015-10-24 | DRG: 292 | Disposition: A | Discharge status: Home / Self Care | Payer: Medicare Other | Attending: Internal Medicine | Admitting: Internal Medicine

## 2015-10-22 ENCOUNTER — Encounter (HOSPITAL_COMMUNITY): Payer: Self-pay | Admitting: *Deleted

## 2015-10-22 ENCOUNTER — Emergency Department (HOSPITAL_COMMUNITY): Payer: Medicare Other

## 2015-10-22 DIAGNOSIS — E1121 Type 2 diabetes mellitus with diabetic nephropathy: Secondary | ICD-10-CM

## 2015-10-22 DIAGNOSIS — I509 Heart failure, unspecified: Secondary | ICD-10-CM | POA: Diagnosis not present

## 2015-10-22 DIAGNOSIS — M545 Low back pain: Secondary | ICD-10-CM | POA: Diagnosis not present

## 2015-10-22 DIAGNOSIS — Z7982 Long term (current) use of aspirin: Secondary | ICD-10-CM | POA: Diagnosis not present

## 2015-10-22 DIAGNOSIS — I255 Ischemic cardiomyopathy: Secondary | ICD-10-CM | POA: Diagnosis present

## 2015-10-22 DIAGNOSIS — Z955 Presence of coronary angioplasty implant and graft: Secondary | ICD-10-CM

## 2015-10-22 DIAGNOSIS — I5043 Acute on chronic combined systolic (congestive) and diastolic (congestive) heart failure: Secondary | ICD-10-CM | POA: Diagnosis present

## 2015-10-22 DIAGNOSIS — Z9842 Cataract extraction status, left eye: Secondary | ICD-10-CM

## 2015-10-22 DIAGNOSIS — I110 Hypertensive heart disease with heart failure: Principal | ICD-10-CM | POA: Diagnosis present

## 2015-10-22 DIAGNOSIS — Z7984 Long term (current) use of oral hypoglycemic drugs: Secondary | ICD-10-CM | POA: Diagnosis not present

## 2015-10-22 DIAGNOSIS — E669 Obesity, unspecified: Secondary | ICD-10-CM | POA: Diagnosis present

## 2015-10-22 DIAGNOSIS — I252 Old myocardial infarction: Secondary | ICD-10-CM

## 2015-10-22 DIAGNOSIS — G4733 Obstructive sleep apnea (adult) (pediatric): Secondary | ICD-10-CM | POA: Diagnosis present

## 2015-10-22 DIAGNOSIS — Z9841 Cataract extraction status, right eye: Secondary | ICD-10-CM | POA: Diagnosis not present

## 2015-10-22 DIAGNOSIS — G8929 Other chronic pain: Secondary | ICD-10-CM | POA: Diagnosis present

## 2015-10-22 DIAGNOSIS — E119 Type 2 diabetes mellitus without complications: Secondary | ICD-10-CM | POA: Diagnosis present

## 2015-10-22 DIAGNOSIS — I959 Hypotension, unspecified: Secondary | ICD-10-CM | POA: Diagnosis present

## 2015-10-22 DIAGNOSIS — E785 Hyperlipidemia, unspecified: Secondary | ICD-10-CM | POA: Diagnosis not present

## 2015-10-22 DIAGNOSIS — Z6841 Body Mass Index (BMI) 40.0 and over, adult: Secondary | ICD-10-CM

## 2015-10-22 DIAGNOSIS — Z87891 Personal history of nicotine dependence: Secondary | ICD-10-CM | POA: Diagnosis not present

## 2015-10-22 DIAGNOSIS — I2510 Atherosclerotic heart disease of native coronary artery without angina pectoris: Secondary | ICD-10-CM | POA: Insufficient documentation

## 2015-10-22 DIAGNOSIS — R079 Chest pain, unspecified: Secondary | ICD-10-CM

## 2015-10-22 DIAGNOSIS — R0989 Other specified symptoms and signs involving the circulatory and respiratory systems: Secondary | ICD-10-CM | POA: Diagnosis not present

## 2015-10-22 DIAGNOSIS — I2583 Coronary atherosclerosis due to lipid rich plaque: Secondary | ICD-10-CM

## 2015-10-22 DIAGNOSIS — R0602 Shortness of breath: Secondary | ICD-10-CM | POA: Diagnosis not present

## 2015-10-22 DIAGNOSIS — R072 Precordial pain: Secondary | ICD-10-CM | POA: Diagnosis not present

## 2015-10-22 DIAGNOSIS — I10 Essential (primary) hypertension: Secondary | ICD-10-CM

## 2015-10-22 DIAGNOSIS — Z951 Presence of aortocoronary bypass graft: Secondary | ICD-10-CM | POA: Diagnosis not present

## 2015-10-22 DIAGNOSIS — I5023 Acute on chronic systolic (congestive) heart failure: Secondary | ICD-10-CM | POA: Diagnosis not present

## 2015-10-22 DIAGNOSIS — H269 Unspecified cataract: Secondary | ICD-10-CM | POA: Diagnosis present

## 2015-10-22 HISTORY — DX: Morbid (severe) obesity due to excess calories: E66.01

## 2015-10-22 HISTORY — DX: Heart failure, unspecified: I50.9

## 2015-10-22 HISTORY — DX: Other chronic pain: G89.29

## 2015-10-22 HISTORY — DX: Ischemic cardiomyopathy: I25.5

## 2015-10-22 HISTORY — DX: Type 2 diabetes mellitus without complications: E11.9

## 2015-10-22 HISTORY — DX: Dorsalgia, unspecified: M54.9

## 2015-10-22 HISTORY — DX: Obstructive sleep apnea (adult) (pediatric): G47.33

## 2015-10-22 LAB — BASIC METABOLIC PANEL
ANION GAP: 11 (ref 5–15)
BUN: 19 mg/dL (ref 6–20)
CALCIUM: 9.6 mg/dL (ref 8.9–10.3)
CO2: 24 mmol/L (ref 22–32)
Chloride: 104 mmol/L (ref 101–111)
Creatinine, Ser: 0.89 mg/dL (ref 0.61–1.24)
GFR calc Af Amer: >60 mL/min (ref >60)
Glucose, Bld: 149 mg/dL — ABNORMAL HIGH (ref 65–99)
POTASSIUM: 4.6 mmol/L (ref 3.5–5.1)
SODIUM: 139 mmol/L (ref 135–145)

## 2015-10-22 LAB — CBC
HEMATOCRIT: 44.5 % (ref 39.0–52.0)
HEMOGLOBIN: 14.7 g/dL (ref 13.0–17.0)
MCH: 30.8 pg (ref 26.0–34.0)
MCHC: 33 g/dL (ref 30.0–36.0)
MCV: 93.1 fL (ref 78.0–100.0)
Platelets: 200 10*3/uL (ref 150–400)
RBC: 4.78 MIL/uL (ref 4.22–5.81)
RDW: 13.9 % (ref 11.5–15.5)
WBC: 8.8 10*3/uL (ref 4.0–10.5)

## 2015-10-22 LAB — TROPONIN I
Troponin I: <0.03 ng/mL (ref <0.031)
Troponin I: <0.03 ng/mL (ref <0.031)
Troponin I: <0.03 ng/mL (ref <0.031)

## 2015-10-22 LAB — LIPASE, BLOOD: Lipase: 31 U/L (ref 11–51)

## 2015-10-22 LAB — HEPATIC FUNCTION PANEL
ALBUMIN: 3.6 g/dL (ref 3.5–5.0)
ALK PHOS: 63 U/L (ref 38–126)
ALT: 14 U/L — ABNORMAL LOW (ref 17–63)
AST: 16 U/L (ref 15–41)
BILIRUBIN TOTAL: 1.1 mg/dL (ref 0.3–1.2)
Bilirubin, Direct: 0.2 mg/dL (ref 0.1–0.5)
Indirect Bilirubin: 0.9 mg/dL (ref 0.3–0.9)
Total Protein: 6.8 g/dL (ref 6.5–8.1)

## 2015-10-22 LAB — GLUCOSE, CAPILLARY: Glucose-Capillary: 141 mg/dL — ABNORMAL HIGH (ref 65–99)

## 2015-10-22 LAB — CBG MONITORING, ED
Glucose-Capillary: 106 mg/dL — ABNORMAL HIGH (ref 65–99)
Glucose-Capillary: 94 mg/dL (ref 65–99)

## 2015-10-22 LAB — BRAIN NATRIURETIC PEPTIDE: B Natriuretic Peptide: 547.7 pg/mL — ABNORMAL HIGH (ref 0.0–100.0)

## 2015-10-22 MED ORDER — OFLOXACIN 0.3 % OP SOLN: 1.0000 [drp] | OPHTHALMIC | Status: DC

## 2015-10-22 MED ORDER — FUROSEMIDE 10 MG/ML IJ SOLN
60.0000 mg | Freq: Once | INTRAMUSCULAR | Status: AC
  Administered 2015-10-22: 60 mg via INTRAVENOUS
  Filled 2015-10-22: qty 6

## 2015-10-22 MED ORDER — CLOPIDOGREL BISULFATE 75 MG PO TABS
75.0000 mg | ORAL_TABLET | Freq: Every day | ORAL | Status: DC
  Administered 2015-10-22 – 2015-10-24 (×3): 75 mg via ORAL
  Filled 2015-10-22 (×3): qty 1

## 2015-10-22 MED ORDER — MORPHINE SULFATE (PF) 2 MG/ML IV SOLN
2.0000 mg | INTRAVENOUS | Status: DC | PRN
  Administered 2015-10-24: 2 mg via INTRAVENOUS
  Filled 2015-10-22: qty 1

## 2015-10-22 MED ORDER — ENOXAPARIN SODIUM 40 MG/0.4ML ~~LOC~~ SOLN
40.0000 mg | SUBCUTANEOUS | Status: DC
  Administered 2015-10-22 – 2015-10-23 (×2): 40 mg via SUBCUTANEOUS
  Filled 2015-10-22 (×2): qty 0.4

## 2015-10-22 MED ORDER — ONDANSETRON HCL 4 MG PO TABS: 4.0000 mg | ORAL_TABLET | Freq: Four times a day (QID) | ORAL | Status: DC | PRN

## 2015-10-22 MED ORDER — LISINOPRIL 5 MG PO TABS
5.0000 mg | ORAL_TABLET | Freq: Every day | ORAL | Status: DC
  Administered 2015-10-23: 5 mg via ORAL
  Filled 2015-10-22 (×3): qty 1

## 2015-10-22 MED ORDER — INSULIN ASPART 100 UNIT/ML ~~LOC~~ SOLN
0.0000 [IU] | Freq: Three times a day (TID) | SUBCUTANEOUS | Status: DC
  Administered 2015-10-22: 1 [IU] via SUBCUTANEOUS

## 2015-10-22 MED ORDER — OFLOXACIN 0.3 % OP SOLN
1.0000 [drp] | Freq: Four times a day (QID) | OPHTHALMIC | Status: DC
  Administered 2015-10-22 – 2015-10-24 (×7): 1 [drp] via OPHTHALMIC

## 2015-10-22 MED ORDER — ROSUVASTATIN CALCIUM 40 MG PO TABS
40.0000 mg | ORAL_TABLET | Freq: Every day | ORAL | Status: DC
  Administered 2015-10-22 – 2015-10-23 (×2): 40 mg via ORAL
  Filled 2015-10-22 (×3): qty 1

## 2015-10-22 MED ORDER — ONDANSETRON HCL 4 MG/2ML IJ SOLN: 4.0000 mg | Freq: Four times a day (QID) | INTRAMUSCULAR | Status: DC | PRN

## 2015-10-22 MED ORDER — ASPIRIN 325 MG PO TABS
325.0000 mg | ORAL_TABLET | Freq: Every day | ORAL | Status: DC
  Administered 2015-10-22 – 2015-10-24 (×3): 325 mg via ORAL
  Filled 2015-10-22 (×2): qty 1

## 2015-10-22 MED ORDER — BISACODYL 10 MG RE SUPP: 10.0000 mg | Freq: Every day | RECTAL | Status: DC | PRN

## 2015-10-22 MED ORDER — PREDNISOLONE ACETATE 1 % OP SUSP: 1.0000 [drp] | OPHTHALMIC | Status: DC

## 2015-10-22 MED ORDER — ZOLPIDEM TARTRATE 5 MG PO TABS: 5.0000 mg | ORAL_TABLET | Freq: Every evening | ORAL | Status: DC | PRN

## 2015-10-22 MED ORDER — ASPIRIN 325 MG PO TABS
325.0000 mg | ORAL_TABLET | Freq: Every day | ORAL | Status: DC
  Filled 2015-10-22 (×2): qty 1

## 2015-10-22 MED ORDER — SODIUM CHLORIDE 0.9 % IV SOLN
INTRAVENOUS | Status: DC
  Administered 2015-10-22: 11:00:00 via INTRAVENOUS

## 2015-10-22 MED ORDER — PREDNISOLONE ACETATE 1 % OP SUSP
1.0000 [drp] | Freq: Four times a day (QID) | OPHTHALMIC | Status: DC
  Administered 2015-10-22 – 2015-10-24 (×7): 1 [drp] via OPHTHALMIC

## 2015-10-22 MED ORDER — KETOROLAC TROMETHAMINE 0.5 % OP SOLN
1.0000 [drp] | Freq: Four times a day (QID) | OPHTHALMIC | Status: DC
  Administered 2015-10-22 – 2015-10-24 (×7): 1 [drp] via OPHTHALMIC

## 2015-10-22 MED ORDER — BRIMONIDINE TARTRATE 0.15 % OP SOLN
1.0000 [drp] | Freq: Two times a day (BID) | OPHTHALMIC | Status: DC
  Administered 2015-10-22 – 2015-10-24 (×5): 1 [drp] via OPHTHALMIC

## 2015-10-22 MED ORDER — NITROGLYCERIN 2 % TD OINT
1.0000 [in_us] | TOPICAL_OINTMENT | Freq: Once | TRANSDERMAL | Status: AC
  Administered 2015-10-22: 1 [in_us] via TOPICAL
  Filled 2015-10-22: qty 1

## 2015-10-22 MED ORDER — METOPROLOL TARTRATE 25 MG PO TABS
25.0000 mg | ORAL_TABLET | Freq: Two times a day (BID) | ORAL | Status: DC
  Administered 2015-10-22 – 2015-10-23 (×3): 25 mg via ORAL
  Filled 2015-10-22 (×5): qty 1

## 2015-10-22 MED ORDER — SENNA 8.6 MG PO TABS
1.0000 | ORAL_TABLET | Freq: Two times a day (BID) | ORAL | Status: DC
  Administered 2015-10-22 – 2015-10-24 (×4): 8.6 mg via ORAL
  Filled 2015-10-22 (×5): qty 1

## 2015-10-22 MED ORDER — ACETAMINOPHEN 325 MG PO TABS
650.0000 mg | ORAL_TABLET | ORAL | Status: DC | PRN
  Administered 2015-10-22 (×2): 650 mg via ORAL
  Filled 2015-10-22 (×2): qty 2

## 2015-10-22 NOTE — ED Notes (Signed)
Wife leaving, gave nurses number. sts she will call up here to get updates.

## 2015-10-22 NOTE — ED Notes (Signed)
Cataract surgery tuesday

## 2015-10-22 NOTE — Progress Notes (Signed)
Received voice message from [facility] ED [place] of pt CM consult and being admitted CM consult for CHF screen

## 2015-10-22 NOTE — H&P (Signed)
[facility] History and Physical  [name] [name] [id number] DOB: Jan 07, 1951 DOA: 10/22/2015  Referring physician: Emergency Department PCP: Sallee Lange, MD   CHIEF COMPLAINT:                   HPI: [name] [name] is a 65 y.o. male   With multiple medical problems not limited to  CAD / CABG 2001, non-STEMI 2009 status post PCI.  CHF with EF < 15%, MD, hyperlipidemia, and obesity  Patient presents to the emergency department with complaints of lower chest/epigastric pain present for about 4 weeks. The pain seems to be most at night when patient in supine position. He relates the pain to eyedrops that he has been using over the last several weeks following eye surgery. Sitting up eases the pain but then patient gets short of breath. The symptoms have been ongoing several weeks but yesterday symptoms did not resolve, hence reason for ED visit. Prior to a few weeks ago patient had no abdominal pain or shortness of breath he states. No nausea or vomiting. Patient was constipated a few weeks back but that has resolved.    ED COURSE:     Patient tachypnea with respirations as high as 27.  Saturations 98% on room air.   SBP 90s to low 100s  Labs:    BNP 547 troponin less than 0.03 White count normal, hemoglobin 14.7       CXR:    Possible mild edema        EKG:    Sinus rhythm with Fusion complexes Rightward axis Cannot rule out Anterior infarct , age undetermined Abnormal ECG Confirmed by YELVERTON MD, DAVID ([id number]) on 10/22/2015 4:46:07 AM                  Medications  aspirin tablet 325 mg (not administered)  furosemide (LASIX) injection 60 mg (60 mg Intravenous Given 10/22/15 0507)  nitroGLYCERIN (NITROGLYN) 2 % ointment 1 inch (1 inch Topical Given 10/22/15 0506)    Review of Systems  Constitutional: Negative.   HENT: Negative.   Eyes: Negative.   Respiratory: Negative.   Cardiovascular: Positive for chest pain and leg swelling.  Gastrointestinal: Positive for  constipation.  Genitourinary: Negative.   Musculoskeletal: Negative.   Skin: Negative.   Neurological: Negative.   Endo/Heme/Allergies: Negative.   Psychiatric/Behavioral: Negative.     Past Medical History  Diagnosis Date  . CAD (coronary artery disease)   . Hyperlipidemia   . Elevated PSA   . Diabetes mellitus without complication (HCC)     prediabetes  . CHF (congestive heart failure) ([place])   . OSA (obstructive sleep apnea)   . Chronic back pain   . Morbid obesity ([place])   . Cardiomyopathy, ischemic    Past Surgical History  Procedure Laterality Date  . Appendectomy    . Ingrown toenail N/A   . Carotid stent    . Left heart catheterization with coronary/graft angiogram N/A 12/21/2014    Procedure: LEFT HEART CATHETERIZATION WITH Beatrix Fetters;  Surgeon: Belva Crome, MD;  Location: [facility] CATH LAB;  Service: Cardiovascular;  Laterality: N/A;  . Coronary artery bypass graft    . Cataract extraction, bilateral      SOCIAL HISTORY:  reports that he has quit smoking. He does not have any smokeless tobacco history on file. He reports that he does not drink alcohol. His drug history is not on file. Lives: At home with wife   Assistive devices:   None  needed for ambulation.   Allergies  Allergen Reactions  . Dyflex-G [Dyphylline-Guaifenesin] Hives    Prior to Admission medications   Medication Sig Start Date End Date Taking? Authorizing Provider  aspirin 325 MG tablet Take 325 mg by mouth daily.   Yes Historical Provider, MD  brimonidine (ALPHAGAN P) 0.1 % SOLN Place 1 drop into the right eye 2 (two) times daily.   Yes Historical Provider, MD  clopidogrel (PLAVIX) 75 MG tablet TAKE ONE TABLET BY MOUTH DAILY. 02/10/15  Yes Kathyrn Drown, MD  furosemide (LASIX) 40 MG tablet TAKE ONE TABLET DAILY EVERY MORNING. 07/05/15  Yes Kathyrn Drown, MD  ketorolac (ACULAR) 0.5 % ophthalmic solution Place 1 drop into the right eye 4 (four) times daily. 10/11/15  Yes Historical  Provider, MD  lisinopril (PRINIVIL,ZESTRIL) 5 MG tablet Take 1 tablet (5 mg total) by mouth daily. 07/05/15  Yes Kathyrn Drown, MD  metoprolol tartrate (LOPRESSOR) 25 MG tablet Take 1 tablet (25 mg total) by mouth 2 (two) times daily. 07/05/15  Yes Kathyrn Drown, MD  Multiple Vitamins-Minerals (CENTRUM SILVER PO) Take 1 tablet by mouth daily.    Yes Historical Provider, MD  NITROSTAT 0.4 MG SL tablet PLACE 1 TABLET UNDER TONGUE FOR CHEST PAIN. MAY REPEAT EVERY 5 MIN UPTO 3 DOSES-NO RELIEF,CALL 911. 11/03/14  Yes Kathyrn Drown, MD  ofloxacin (OCUFLOX) 0.3 % ophthalmic solution Place 1 drop into the right eye every 2 (two) hours. 10/11/15  Yes Historical Provider, MD  prednisoLONE acetate (PRED FORTE) 1 % ophthalmic suspension Place 1 drop into the right eye every 2 (two) hours. 10/11/15  Yes Historical Provider, MD  rosuvastatin (CRESTOR) 40 MG tablet Take 1 tablet (40 mg total) by mouth daily. 07/05/15  Yes Kathyrn Drown, MD  HYDROcodone-acetaminophen (NORCO) 10-325 MG tablet Take 1 tablet by mouth every 4 (four) hours as needed. Patient not taking: Reported on 10/22/2015 07/05/15   Kathyrn Drown, MD  metFORMIN (GLUCOPHAGE-XR) 500 MG 24 hr tablet TAKE 1 TABLET BY MOUTH DAILY WITH BREAKFAST. Patient not taking: Reported on 10/22/2015 07/05/15   Kathyrn Drown, MD   PHYSICAL EXAM: Filed Vitals:   10/22/15 0615 10/22/15 0630 10/22/15 0645 10/22/15 0715  BP: 102/70 100/73 100/73 101/71  Pulse: 78 94 82 94  Temp:      Resp: 19 27 18 22   Height:      Weight:      SpO2: 96% 98% 94% 97%    Wt Readings from Last 3 Encounters:  10/22/15 121.564 kg (268 lb)  07/05/15 124.739 kg (275 lb)  01/01/15 128.096 kg (282 lb 6.4 oz)    General:  Pleasant obese, white male. Appears calm and comfortable Eyes: PER, normal lids, irises & conjunctiva ENT: grossly normal hearing, lips & tongue Neck: no LAD, no masses Cardiovascular: RRR, no murmurs2, 2+ BLE.  Respiratory: Respirations even and unlabored. Normal  respiratory effort. Lungs CTA bilaterally, no wheezes / rales .   Abdomen: soft, non-distended, non-tender, active bowel sounds. No obvious masses.  Skin: no rash seen on limited exam Musculoskeletal: grossly normal tone BUE/BLE Psychiatric: grossly normal mood and affect, speech fluent and appropriate Neurologic: grossly non-focal.         LABS ON ADMISSION:    Basic Metabolic Panel:  Recent Labs Lab 10/22/15 0240  NA 139  K 4.6  CL 104  CO2 24  GLUCOSE 149*  BUN 19  CREATININE 0.89  CALCIUM 9.6    CBC:  Recent Labs  Lab 10/22/15 0240  WBC 8.8  HGB 14.7  HCT 44.5  MCV 93.1  PLT 200    BNP (last 3 results)  Recent Labs  10/22/15 0413  BNP 547.7*    CREATININE: 0.89 (10/22/15 0240) Estimated creatinine clearance - 108.1 mL/min  Radiological Exams on Admission: Dg Chest 2 View  10/22/2015  CLINICAL DATA:  Acute onset of generalized chest pain and shortness of breath. Initial encounter. EXAM: CHEST  2 VIEW COMPARISON:  Chest radiograph performed 10/28/2013 FINDINGS: The lungs are well-aerated. Vascular congestion is noted. Increased interstitial markings raise concern for mild interstitial edema. There is no evidence of pleural effusion or pneumothorax. The heart is enlarged. The patient is status post median sternotomy, with evidence of prior CABG. No acute osseous abnormalities are seen. IMPRESSION: Vascular congestion and cardiomegaly. Increased interstitial markings raise concern for mild interstitial edema. Electronically Signed   By: Garald Balding M.D.   On: 10/22/2015 03:05     ASSESSMENT / PLAN   Chest pain / dyspnea. Heart score 5. Patient has history of CAD / CABG / PCIs / severe DCM with LVEF of < 15 % .It doesn't appear that he follows with cardiology on a regular basis.  His chest pain started several weeks ago, mainly occurs at night after using eye drops and laying down. It gets better with sitting up but then this is followed by SOB. Normal Sats on  r/a.  Etiology of chest pain not clear but cardiac certainly needs to be excluded. Initial trop normal . No acute EKG changes -Admit to telemetry -Chest pain order set  -Heart failure order set -Cardiology consultation -Continue home cardiac meds  CHF exacerbation. Mild edema on chest x-ray. He has peripheral edema and elevated BNP.  -BP on low side, don't think he can tolerate additional lasix right now (got 60 IV in ED), also got NTG patch.  -Strict I's and O's, daily weights so restricted diet  Diabetes mellitus, type 2  -Hold home metformin -Check CBGs, start sliding scale insulin  Hypertension. Hold home BP meds for now given hypotension  Hyperlipidemia -continue Crestor  Chronic lumbar pain -continue home pain medications   CONSULTANTS: Cardiology     Code Status: DNI DVT Prophylaxis: Lovenox  Family Communication:  Patient alert, oriented and understands plan of care.  Disposition Plan: Discharge to home in 2-3 days   Time spent: 60 minutes Tye Savoy  NP [facility] Pager [phone]

## 2015-10-22 NOTE — Plan of Care (Signed)
65 year old male with known history of cardiomyopathy and CAD presents with complaint of chest pain and shortness of breath. Patient has been admitted for further management of CHF and chest pain.  Gean Birchwood.

## 2015-10-22 NOTE — ED Notes (Signed)
Admitting MD at bedside.

## 2015-10-22 NOTE — ED Notes (Signed)
Dr. Lita Mains at the bedside.

## 2015-10-22 NOTE — ED Notes (Signed)
The pt is c/o chest pain since 1999 and he is c/o sob with exertion for weeks.  Tonight it was worse and he has had x2  Sl nitro

## 2015-10-22 NOTE — Consult Note (Signed)
CARDIOLOGY CONSULT NOTE   Patient ID: [name] [name] MRN: [id number], DOB/AGE: 65-Feb-1952   Admit date: 10/22/2015 Date of Consult: 10/22/2015   Primary Physician: Sallee Lange, MD Primary Cardiologist: Dr. Ross/Dr. Johnsie Cancel  Pt. Profile  [name] [name] is morbidly obese 65 yo Caucasian male with past medical history of hypertension, hyperlipidemia, diabetes, ischemic cardiomyopathy with EF 15% and CAD s/p CABG x 6 in 2001 by Dr. Roxy Manns (LIMA to distal LAD, SVG to RI, SVG to OM2, seq SVG to RPLA, SVG to acute marginal, SVG to posterior descending artery) presented with orthopnea, PND and epigastric discomfort.   Problem List  Past Medical History  Diagnosis Date  . CAD (coronary artery disease)   . Hyperlipidemia   . Elevated PSA   . Diabetes mellitus without complication (HCC)     prediabetes  . CHF (congestive heart failure) ([place])   . OSA (obstructive sleep apnea)   . Chronic back pain   . Morbid obesity ([place])   . Cardiomyopathy, ischemic     Past Surgical History  Procedure Laterality Date  . Appendectomy    . Ingrown toenail N/A   . Carotid stent    . Left heart catheterization with coronary/graft angiogram N/A 12/21/2014    Procedure: LEFT HEART CATHETERIZATION WITH Beatrix Fetters;  Surgeon: Belva Crome, MD;  Location: [facility] C[facility] LAB;  Service: Cardiovascular;  Laterality: N/A;  . Coronary artery bypass graft    . Cataract extraction, bilateral       Allergies  Allergies  Allergen Reactions  . Dyflex-G [Dyphylline-Guaifenesin] Hives    HPI   [name] [name] is morbidly obese 65 yo Caucasian male with past medical history of hypertension, hyperlipidemia, diabetes, ischemic cardiomyopathy with EF 15% and CAD s/p CABG x 6 in 2001 by Dr. Roxy Manns (LIMA to distal LAD, SVG to RI, SVG to OM2, seq SVG to RPLA, SVG to acute marginal, SVG to posterior descending artery).  He was admitted with NSTEMI in 2009 and underwent PTCA/BMS to proximal and mid SVG to OM 2 and PLA as well  as PTCA/Promus stent to proximal and mid vein graft to diagonal and vein graft to ramus. Patient has been adamant he does not wish for another stent, however he is amenable to bypass surgery if needed.   Last echocardiogram in March 2015 showed EF less than 15% He was seen by Dr. Johnsie Cancel for cardiac clearance on 12/16/2014. As part of preop clearance, he underwent cardiac catheterization on 12/21/2014 which showed widely patent LIMA to LAD, patent SVG to posterior lateral branch, the sequential SVG to acute marginal was patent however had totally occluded limb to PDA, eccentric 50-70% stenosis proximal to a previously placed stent in SVG to ramus intermedius. LV gram was not done due to inability to cross into the left ventricle. Despite his severely low EF, he does not have significant heart failure symptom after placed on 40 mg oral Lasix. He has been compliant with his medication at home.  According to the patient, he was started on oxofloxacin eyedrops 3 weeks ago, since starting the medication, he has noticed increasing fluid overload signs and intermittent epigastric and substernal pain. He denies any discomfort with ambulation, the symptoms tend to occur when he's trying to lay down at night after having the eyedrops. He states this symptom is different from his previous angina which is more of a crushing chest pain. The location is lower in the epigastric and xiphoid region. The chest discomfort is accompanied by significant shortness of  breath that will make him try to sit up. Once he sits up, his symptom would go away. He eventually sought medical attention [facility] on [facility]/12/2015. He was felt to be fluid overloaded based on interstitial edema that was seen on chest x-ray. Troponin is negative 2. EKG showed no significant ST-T wave changes. BNP was mildly elevated at 547.7. He was given a single dose of a 60 mg IV Lasix and had put out roughly 2.8 Liter of urine so far. His chest discomfort  has completely went away since then.   Inpatient Medications  . aspirin  325 mg Oral Daily  . aspirin  325 mg Oral Daily  . brimonidine  1 drop Right Eye BID  . clopidogrel  75 mg Oral Daily  . enoxaparin (LOVENOX) injection  40 mg Subcutaneous Q24H  . insulin aspart  0-9 Units Subcutaneous TID WC  . ketorolac  1 drop Right Eye QID  . lisinopril  5 mg Oral Daily  . metoprolol tartrate  25 mg Oral BID  . ofloxacin  1 drop Right Eye QID  . prednisoLONE acetate  1 drop Right Eye QID  . rosuvastatin  40 mg Oral q1800  . senna  1 tablet Oral BID    Family History Family History  Problem Relation Age of Onset  . Adopted: Yes     Social History Social History   Social History  . Marital Status: Married    Spouse Name: N/A  . Number of Children: N/A  . Years of Education: N/A   Occupational History  . Not on file.   Social History Main Topics  . Smoking status: Former Research scientist (life sciences)  . Smokeless tobacco: Not on file  . Alcohol Use: No  . Drug Use: No  . Sexual Activity: Not on file   Other Topics Concern  . Not on file   Social History Narrative     Review of Systems  General:  No chills, fever, night sweats or weight changes.  Cardiovascular:  No dyspnea on exertion, edema, palpitations. + chest pain, orthopnea, paroxysmal nocturnal dyspnea. Dermatological: No rash, lesions/masses Respiratory: No cough +dyspnea Urologic: No hematuria, dysuria Abdominal:   No nausea, vomiting, diarrhea, bright red blood per rectum, melena, or hematemesis Neurologic:  No visual changes, wkns, changes in mental status. All other systems reviewed and are otherwise negative except as noted above.  Physical Exam  Blood pressure 100/73, pulse 74, temperature 97.7 F (36.5 C), resp. rate 21, height 5\' 10"  (1.778 m), weight 268 lb (121.564 kg), SpO2 96 %.  General: Pleasant, NAD Psych: Normal affect. Neuro: Alert and oriented X 3. Moves all extremities spontaneously. HEENT: Normal  Neck:  Supple without bruits or JVD. Lungs:  Resp regular and unlabored, Bibasilar rale Heart: RRR no s3, s4, or murmurs. Abdomen: Soft, non-tender, non-distended, BS + x 4.  Extremities: No clubbing, cyanosis. DP/PT/Radials 2+ and equal bilaterally. Trace edema bilaterally.   Labs   Recent Labs  10/22/15 0240 10/22/15 0841 10/22/15 1254  TROPONINI <0.03 <0.03 <0.03   Lab Results  Component Value Date   WBC 8.8 10/22/2015   HGB 14.7 10/22/2015   HCT 44.5 10/22/2015   MCV 93.1 10/22/2015   PLT 200 10/22/2015     Recent Labs Lab 10/22/15 0240 10/22/15 1254  NA 139  --   K 4.6  --   CL 104  --   CO2 24  --   BUN 19  --   CREATININE 0.89  --  CALCIUM 9.6  --   PROT  --  6.8  BILITOT  --  1.1  ALKPHOS  --  63  ALT  --  14*  AST  --  16  GLUCOSE 149*  --    Lab Results  Component Value Date   CHOL 207* 07/06/2015   HDL 37* 07/06/2015   LDLCALC 128* 07/06/2015   TRIG 212* 07/06/2015   Lab Results  Component Value Date   DDIMER  02/28/2008    0.43        AT THE INHOUSE ESTABLISHED CUTOFF VALUE OF 0.48 ug/mL FEU, THIS ASSAY HAS BEEN DOCUMENTED IN THE LITERATURE TO HAVE    Radiology/Studies  Dg Chest 2 View  10/22/2015  CLINICAL DATA:  Acute onset of generalized chest pain and shortness of breath. Initial encounter. EXAM: CHEST  2 VIEW COMPARISON:  Chest radiograph performed 10/28/2013 FINDINGS: The lungs are well-aerated. Vascular congestion is noted. Increased interstitial markings raise concern for mild interstitial edema. There is no evidence of pleural effusion or pneumothorax. The heart is enlarged. The patient is status post median sternotomy, with evidence of prior CABG. No acute osseous abnormalities are seen. IMPRESSION: Vascular congestion and cardiomegaly. Increased interstitial markings raise concern for mild interstitial edema. Electronically Signed   By: Garald Balding M.D.   On: 10/22/2015 03:05    ECG  Normal sinus rhythm without significant ST-T  wave changes.  ASSESSMENT AND PLAN  1. Atypical epigastric/xiphoid pain likely related to fluid overload  - Will discuss with M.D., he is adamant he does not want a stent, but redo CABG is ok and also states the symptom is very atypical and currently completely resolved after IV diuresis. I think his symptom is related to fluid overload and not true angina  - If symptom does not recur after diuresis, would like to avoid any further ischemic workup.  2. Acute on chronic systolic heart failure / ischemic cardio myopathy with EF 15%   - continue IV lasix, he has has bibasilar rale on exam and trace LE edema, however responding very well to 60mg  IV lasix given in ED  - consider 40mg  daily IV lasix, probably need at least 1 more day of diuresis  - repeat echo  3. CAD s/p CABG x 6 in 2001 by Dr. Roxy Manns (LIMA to distal LAD, SVG to RI, SVG to OM2, seq SVG to RPLA, SVG to acute marginal, SVG to posterior descending artery).   4. Hypertension: BP borderline low, will hold BP medication if needed for more diuresis. 5. Hyperlipidemia 6. DM  Signed, Dorothy Spark, PA-C 10/22/2015, 1:54 PM    The patient was seen, examined and discussed with Almyra Deforest, PA-C and I agree with the above.   [name] [name] is morbidly obese 65 yo Caucasian male with past medical history of hypertension, hyperlipidemia, diabetes, ischemic cardiomyopathy with EF 15% and CAD s/p CABG x 6 in 2001 by Dr. Roxy Manns (LIMA to distal LAD, SVG to RI, SVG to OM2, seq SVG to RPLA, SVG to acute marginal, SVG to posterior descending artery).  He was admitted with NSTEMI in 2009 and underwent PTCA/BMS to proximal and mid SVG to OM 2 and PLA as well as PTCA/Promus stent to proximal and mid vein graft to diagonal and vein graft to ramus. Last echocardiogram in March 2015 showed EF less than 15% He was seen by Dr. Johnsie Cancel for cardiac clearance on 12/16/2014. As part of preop clearance, he underwent cardiac catheterization on 12/21/2014 which showed  widely patent LIMA to LAD, patent SVG to posterior lateral branch, the sequential SVG to acute marginal was patent however had totally occluded limb to PDA, eccentric 50-70% stenosis proximal to a previously placed stent in SVG to ramus intermedius.   The patient has been stable in the last 10 months, however developed worsening shortness of breath and epigastric and substernal pain in the last few days. He lives very sedentary lifestyle lately as he has been through cataract surgery with restrictions to exercise. His EKG is unchanged from prior and troponin is negative 3. His BNP was elevated at 547. I would recommend an ischemic workup at this time, I think that his condition secondary to acute on chronic combined systolic and diastolic heart failure.  The patient received IV Lasix in the ER and diuresed 2.8 L in the last 12 hours. He feels significantly better, his chest pain has resolved and he would like to go home. However on physical exam he continues to have crackles at the bases of his lungs. Creatinine is normal and stable. We'll keep overnight, continue IV diuresis and switch to by mouth Lasix in the morning. I believe he should increase his home Lasix 40 mg daily to 40 mg twice a day. Most importantly the patient will require CHF teaching with regards to diet exercise, checking daily weights and use of Lasix. Patient is lacking facing understanding of his diagnosis, management and lifestyle modifications. I had a lengthy discussion trying to explain basic stuff but he will need repeated reinforcement.   Dorothy Spark 10/22/2015

## 2015-10-22 NOTE — ED Provider Notes (Signed)
CSN: [id number]     Arrival date & time 10/22/15  0212 History  By signing my name below, I, Evelene Croon, attest that this documentation has been prepared under the direction and in the presence of Julianne Rice, MD . Electronically Signed: Evelene Croon, Scribe. 10/22/2015. 4:09 AM.    Chief Complaint  Patient presents with  . Chest Pain    The history is provided by the patient. No language interpreter was used.     HPI Comments:  [name] [name] is a 65 y.o. male with a history of CAD,CHF, and HLD, who presents to the Emergency Department complaining of intermittent left sided CP x a  few weeks. He reports associated SOB that is worse when supineAnd with exertion. Pt states he took 2 NTG with mild relief. He also reports acute swelling in his bilateral lower extremities.   Pt had cataracts surgery in his right eye 5 days ago. No complications.   CariologistMidwife  Past Medical History  Diagnosis Date  . CAD (coronary artery disease)   . Hyperlipidemia   . Elevated PSA   . Diabetes mellitus without complication (HCC)     prediabetes  . CHF (congestive heart failure) ([place])   . OSA (obstructive sleep apnea)   . Chronic back pain   . Morbid obesity ([place])   . Cardiomyopathy, ischemic    Past Surgical History  Procedure Laterality Date  . Appendectomy    . Ingrown toenail N/A   . Carotid stent    . Left heart catheterization with coronary/graft angiogram N/A 12/21/2014    Procedure: LEFT HEART CATHETERIZATION WITH Beatrix Fetters;  Surgeon: Belva Crome, MD;  Location: [facility] CATH LAB;  Service: Cardiovascular;  Laterality: N/A;  . Coronary artery bypass graft    . Cataract extraction, bilateral     No family history on file. Social History  Substance Use Topics  . Smoking status: Former Research scientist (life sciences)  . Smokeless tobacco: None  . Alcohol Use: No    Review of Systems  Constitutional: Negative for fever and chills.  Respiratory: Positive for shortness of breath.  Negative for chest tightness.   Cardiovascular: Positive for chest pain. Negative for palpitations and leg swelling.  Gastrointestinal: Negative for nausea, vomiting, abdominal pain and diarrhea.  Musculoskeletal: Negative for myalgias, back pain, neck pain and neck stiffness.  Skin: Negative for rash and wound.  Neurological: Negative for dizziness, weakness and numbness.  All other systems reviewed and are negative.  Allergies  Dyflex-g  Home Medications   Prior to Admission medications   Medication Sig Start Date End Date Taking? Authorizing Provider  aspirin 325 MG tablet Take 325 mg by mouth daily.   Yes Historical Provider, MD  brimonidine (ALPHAGAN P) 0.1 % SOLN Place 1 drop into the right eye 2 (two) times daily.   Yes Historical Provider, MD  clopidogrel (PLAVIX) 75 MG tablet TAKE ONE TABLET BY MOUTH DAILY. 02/10/15  Yes Kathyrn Drown, MD  furosemide (LASIX) 40 MG tablet TAKE ONE TABLET DAILY EVERY MORNING. 07/05/15  Yes Kathyrn Drown, MD  ketorolac (ACULAR) 0.5 % ophthalmic solution Place 1 drop into the right eye 4 (four) times daily. 10/11/15  Yes Historical Provider, MD  lisinopril (PRINIVIL,ZESTRIL) 5 MG tablet Take 1 tablet (5 mg total) by mouth daily. 07/05/15  Yes Kathyrn Drown, MD  metoprolol tartrate (LOPRESSOR) 25 MG tablet Take 1 tablet (25 mg total) by mouth 2 (two) times daily. 07/05/15  Yes Kathyrn Drown, MD  Multiple  Vitamins-Minerals (CENTRUM SILVER PO) Take 1 tablet by mouth daily.    Yes Historical Provider, MD  NITROSTAT 0.4 MG SL tablet PLACE 1 TABLET UNDER TONGUE FOR CHEST PAIN. MAY REPEAT EVERY 5 MIN UPTO 3 DOSES-NO RELIEF,CALL 911. 11/03/14  Yes Kathyrn Drown, MD  ofloxacin (OCUFLOX) 0.3 % ophthalmic solution Place 1 drop into the right eye every 2 (two) hours. 10/11/15  Yes Historical Provider, MD  prednisoLONE acetate (PRED FORTE) 1 % ophthalmic suspension Place 1 drop into the right eye every 2 (two) hours. 10/11/15  Yes Historical Provider, MD  rosuvastatin  (CRESTOR) 40 MG tablet Take 1 tablet (40 mg total) by mouth daily. 07/05/15  Yes Kathyrn Drown, MD  HYDROcodone-acetaminophen (NORCO) 10-325 MG tablet Take 1 tablet by mouth every 4 (four) hours as needed. Patient not taking: Reported on 10/22/2015 07/05/15   Kathyrn Drown, MD  metFORMIN (GLUCOPHAGE-XR) 500 MG 24 hr tablet TAKE 1 TABLET BY MOUTH DAILY WITH BREAKFAST. Patient not taking: Reported on 10/22/2015 07/05/15   Kathyrn Drown, MD   BP 107/73 mmHg  Pulse 78  Temp(Src) 97.7 F (36.5 C)  Resp 21  Ht 5\' 10"  (1.778 m)  Wt 268 lb (121.564 kg)  BMI 38.45 kg/m2  SpO2 98% Physical Exam  Constitutional: He is oriented to person, place, and time. He appears well-developed and well-nourished. No distress.  HENT:  Head: Normocephalic and atraumatic.  Mouth/Throat: Oropharynx is clear and moist. No oropharyngeal exudate.  Eyes: EOM are normal. Pupils are equal, round, and reactive to light.  Neck: Normal range of motion. Neck supple.  Cardiovascular: Normal rate and regular rhythm.   Pulmonary/Chest: Effort normal. No respiratory distress. He has no wheezes. He has rales. He exhibits no tenderness.  Crackles bilateral bases  Abdominal: Soft. Bowel sounds are normal. He exhibits no distension and no mass. There is no tenderness. There is no rebound and no guarding.  Musculoskeletal: Normal range of motion. He exhibits edema. He exhibits no tenderness.  2+ bilateral pitting edema. No calf tenderness or asymmetry. Distal pulses equal  Neurological: He is alert and oriented to person, place, and time.  Moves all extremities without deficit. Sensation is fully intact.  Skin: Skin is warm and dry. No rash noted. No erythema.  Psychiatric: He has a normal mood and affect. His behavior is normal.  Nursing note and vitals reviewed.   ED Course  Procedures   DIAGNOSTIC STUDIES:  Oxygen Saturation is 98% on RA, normal by my interpretation.    COORDINATION OF CARE:  4:08 AM Discussed  treatment plan with pt at bedside and pt agreed to plan.   Labs Review Labs Reviewed  BASIC METABOLIC PANEL - Abnormal; Notable for the following:    Glucose, Bld 149 (*)    All other components within normal limits  BRAIN NATRIURETIC PEPTIDE - Abnormal; Notable for the following:    B Natriuretic Peptide 547.7 (*)    All other components within normal limits  CBC  TROPONIN I    Imaging Review Dg Chest 2 View  10/22/2015  CLINICAL DATA:  Acute onset of generalized chest pain and shortness of breath. Initial encounter. EXAM: CHEST  2 VIEW COMPARISON:  Chest radiograph performed 10/28/2013 FINDINGS: The lungs are well-aerated. Vascular congestion is noted. Increased interstitial markings raise concern for mild interstitial edema. There is no evidence of pleural effusion or pneumothorax. The heart is enlarged. The patient is status post median sternotomy, with evidence of prior CABG. No acute osseous abnormalities are  seen. IMPRESSION: Vascular congestion and cardiomegaly. Increased interstitial markings raise concern for mild interstitial edema. Electronically Signed   By: Garald Balding M.D.   On: 10/22/2015 03:05   I have personally reviewed and evaluated these images and lab results as part of my medical decision-making.   EKG Interpretation   Date/Time:  Friday October 22 2015 02:18:39 EST Ventricular Rate:  77 PR Interval:  206 QRS Duration: 94 QT Interval:  426 QTC Calculation: 482 R Axis:   94 Text Interpretation:  Sinus rhythm with Fusion complexes Rightward axis  Cannot rule out Anterior infarct , age undetermined Abnormal ECG Confirmed  by Lita Mains  MD, Jibril Mcminn ([id number]) on 10/22/2015 4:46:07 AM      MDM   Final diagnoses:  Chest pain, unspecified chest pain type  Acute on chronic congestive heart failure, unspecified congestive heart failure type ([place])    I personally performed the services described in this documentation, which was scribed in my presence. The  recorded information has been reviewed and is accurate.    Patient with evidence of CHF exacerbation. Given IV Lasix, Nitropaste and aspirin in the emergency department. Discuss with [facility] hospitalist and will admit to telemetry bed.  Julianne Rice, MD 10/22/15 [phone]

## 2015-10-23 ENCOUNTER — Inpatient Hospital Stay (HOSPITAL_COMMUNITY): Payer: Medicare Other

## 2015-10-23 DIAGNOSIS — I255 Ischemic cardiomyopathy: Secondary | ICD-10-CM

## 2015-10-23 DIAGNOSIS — R072 Precordial pain: Secondary | ICD-10-CM

## 2015-10-23 DIAGNOSIS — I509 Heart failure, unspecified: Secondary | ICD-10-CM

## 2015-10-23 DIAGNOSIS — M545 Low back pain: Secondary | ICD-10-CM

## 2015-10-23 DIAGNOSIS — E785 Hyperlipidemia, unspecified: Secondary | ICD-10-CM

## 2015-10-23 DIAGNOSIS — I5023 Acute on chronic systolic (congestive) heart failure: Secondary | ICD-10-CM

## 2015-10-23 DIAGNOSIS — E119 Type 2 diabetes mellitus without complications: Secondary | ICD-10-CM

## 2015-10-23 DIAGNOSIS — G8929 Other chronic pain: Secondary | ICD-10-CM

## 2015-10-23 DIAGNOSIS — R079 Chest pain, unspecified: Secondary | ICD-10-CM

## 2015-10-23 LAB — CBC
HEMATOCRIT: 43.3 % (ref 39.0–52.0)
HEMOGLOBIN: 14.3 g/dL (ref 13.0–17.0)
MCH: 30 pg (ref 26.0–34.0)
MCHC: 33 g/dL (ref 30.0–36.0)
MCV: 91 fL (ref 78.0–100.0)
Platelets: 215 10*3/uL (ref 150–400)
RBC: 4.76 MIL/uL (ref 4.22–5.81)
RDW: 13.7 % (ref 11.5–15.5)
WBC: 9.3 10*3/uL (ref 4.0–10.5)

## 2015-10-23 LAB — GLUCOSE, CAPILLARY
GLUCOSE-CAPILLARY: 130 mg/dL — AB (ref 65–99)
GLUCOSE-CAPILLARY: 119 mg/dL — AB (ref 65–99)
GLUCOSE-CAPILLARY: 148 mg/dL — AB (ref 65–99)
Glucose-Capillary: 122 mg/dL — ABNORMAL HIGH (ref 65–99)
Glucose-Capillary: 129 mg/dL — ABNORMAL HIGH (ref 65–99)

## 2015-10-23 LAB — BASIC METABOLIC PANEL
ANION GAP: 9 (ref 5–15)
BUN: 19 mg/dL (ref 6–20)
CHLORIDE: 106 mmol/L (ref 101–111)
CO2: 24 mmol/L (ref 22–32)
Calcium: 8.7 mg/dL — ABNORMAL LOW (ref 8.9–10.3)
Creatinine, Ser: 1.06 mg/dL (ref 0.61–1.24)
GFR calc non Af Amer: >60 mL/min (ref >60)
GLUCOSE: 150 mg/dL — AB (ref 65–99)
POTASSIUM: 4 mmol/L (ref 3.5–5.1)
Sodium: 139 mmol/L (ref 135–145)

## 2015-10-23 MED ORDER — PERFLUTREN LIPID MICROSPHERE
1.0000 mL | INTRAVENOUS | Status: AC | PRN
  Filled 2015-10-23: qty 10

## 2015-10-23 MED ORDER — POLYETHYLENE GLYCOL 3350 17 G PO PACK: 17.0000 g | PACK | Freq: Every day | ORAL | Status: DC

## 2015-10-23 MED ORDER — PERFLUTREN LIPID MICROSPHERE
INTRAVENOUS | Status: AC
  Administered 2015-10-23: 12:00:00
  Filled 2015-10-23: qty 10

## 2015-10-23 MED ORDER — INSULIN ASPART 100 UNIT/ML ~~LOC~~ SOLN
0.0000 [IU] | Freq: Three times a day (TID) | SUBCUTANEOUS | Status: DC
  Administered 2015-10-23 – 2015-10-24 (×3): 1 [IU] via SUBCUTANEOUS

## 2015-10-23 MED ORDER — FUROSEMIDE 40 MG PO TABS
40.0000 mg | ORAL_TABLET | Freq: Two times a day (BID) | ORAL | Status: DC
  Administered 2015-10-23 – 2015-10-24 (×3): 40 mg via ORAL
  Filled 2015-10-23 (×2): qty 1
  Filled 2015-10-23: qty 2

## 2015-10-23 NOTE — Progress Notes (Signed)
SUBJECTIVE: The patient is doing well today.  At this time, he denies chest pain, shortness of breath, or any new concerns.  He says that he feels much better since he presented to the hospital. He is able to lie flat without any issues. He has recently been switched to by mouth medications for diuresis.  Marland Kitchen aspirin  325 mg Oral Daily  . brimonidine  1 drop Right Eye BID  . clopidogrel  75 mg Oral Daily  . enoxaparin (LOVENOX) injection  40 mg Subcutaneous Q24H  . furosemide  40 mg Oral BID  . insulin aspart  0-9 Units Subcutaneous TID WC  . ketorolac  1 drop Right Eye QID  . lisinopril  5 mg Oral Daily  . metoprolol tartrate  25 mg Oral BID  . ofloxacin  1 drop Right Eye QID  . prednisoLONE acetate  1 drop Right Eye QID  . rosuvastatin  40 mg Oral q1800  . senna  1 tablet Oral BID      OBJECTIVE: Physical Exam: Filed Vitals:   10/22/15 1700 10/22/15 2100 10/23/15 0039 10/23/15 0512  BP: 102/70 104/64 100/58 129/74  Pulse: 82 78 76 86  Temp: 98.7 F (37.1 C) 98.4 F (36.9 C) 98 F (36.7 C) 97.9 F (36.6 C)  TempSrc: Oral Oral Oral Oral  Resp: 22 18 16 16   Height: 5\' 10"  (1.778 m)     Weight: 280 lb 3.2 oz (127.098 kg)   282 lb 14.4 oz (128.323 kg)  SpO2: 96% 98% 100% 97%    Intake/Output Summary (Last 24 hours) at 10/23/15 1138 Last data filed at 10/23/15 0900  Gross per 24 hour  Intake   1000 ml  Output    520 ml  Net    480 ml    Telemetry reveals sinus rhythm  GEN- The patient is well appearing, alert and oriented x 3 today.   Head- normocephalic, atraumatic Eyes-  Sclera clear, conjunctiva pink Ears- hearing intact Oropharynx- clear Neck- supple, no JVP Lymph- no cervical lymphadenopathy Lungs- Clear to ausculation bilaterally, normal work of breathing Heart- Regular rate and rhythm, no murmurs, rubs or gallops, PMI not laterally displaced GI- soft, NT, ND, + BS Extremities- no clubbing, cyanosis, or edema Skin- no rash or lesion Psych- euthymic mood,  full affect Neuro- strength and sensation are intact  LABS: Basic Metabolic Panel:  Recent Labs  10/22/15 0240 10/23/15 0308  NA 139 139  K 4.6 4.0  CL 104 106  CO2 24 24  GLUCOSE 149* 150*  BUN 19 19  CREATININE 0.89 1.06  CALCIUM 9.6 8.7*   Liver Function Tests:  Recent Labs  10/22/15 1254  AST 16  ALT 14*  ALKPHOS 63  BILITOT 1.1  PROT 6.8  ALBUMIN 3.6    Recent Labs  10/22/15 1254  LIPASE 31   CBC:  Recent Labs  10/22/15 0240 10/23/15 0308  WBC 8.8 9.3  HGB 14.7 14.3  HCT 44.5 43.3  MCV 93.1 91.0  PLT 200 215   Cardiac Enzymes:  Recent Labs  10/22/15 0841 10/22/15 1254 10/22/15 1533  TROPONINI <0.03 <0.03 <0.03   BNP: Invalid input(s): POCBNP D-Dimer: No results for input(s): DDIMER in the last 72 hours. Hemoglobin A1C: No results for input(s): HGBA1C in the last 72 hours. Fasting Lipid Panel: No results for input(s): CHOL, HDL, LDLCALC, TRIG, CHOLHDL, LDLDIRECT in the last 72 hours. Thyroid Function Tests: No results for input(s): TSH, T4TOTAL, T3FREE, THYROIDAB in the last 72 hours.  Invalid input(s): FREET3 Anemia Panel: No results for input(s): VITAMINB12, FOLATE, FERRITIN, TIBC, IRON, RETICCTPCT in the last 72 hours.  RADIOLOGY: Dg Chest 2 View  10/22/2015  CLINICAL DATA:  Acute onset of generalized chest pain and shortness of breath. Initial encounter. EXAM: CHEST  2 VIEW COMPARISON:  Chest radiograph performed 10/28/2013 FINDINGS: The lungs are well-aerated. Vascular congestion is noted. Increased interstitial markings raise concern for mild interstitial edema. There is no evidence of pleural effusion or pneumothorax. The heart is enlarged. The patient is status post median sternotomy, with evidence of prior CABG. No acute osseous abnormalities are seen. IMPRESSION: Vascular congestion and cardiomegaly. Increased interstitial markings raise concern for mild interstitial edema. Electronically Signed   By: Garald Balding M.D.   On:  10/22/2015 03:05    ASSESSMENT AND PLAN:  Active Problems:   Hyperlipemia   Cardiomyopathy, ischemic   Chronic lumbar pain   CHF exacerbation (HCC)   Chest pain   Diabetes mellitus, type 2 ([place])  Presented  To the hospital with decompensated heart failure. Currently he is doing much better having diuresed 2.3 L. He has been switched to by mouth  Lasix , getting his first dose at 10:30 this morning. Would continue him on oral Lasix currently to see how he diuresis. If he diureses well with his 2 doses today would consider discharge tomorrow with close follow-up in clinic.  Lucella Pommier Meredith Leeds, MD 10/23/2015 11:38 AM

## 2015-10-23 NOTE — Progress Notes (Addendum)
[facility]                                                                              Patient Demographics  [name] [name], is a 65 y.o. male, DOB - [birth year], EI:[id number]  Admit date - 10/22/2015   Admitting Physician [name] Patricia, MD  Outpatient Primary MD for the patient is [name] Lange, MD  LOS - 1   Chief Complaint  Patient presents with  . Chest Pain      HPI On 10/22/15 by Ms. [name] Savoy NP [name] [name] is a 65 y.o. male With multiple medical problems not limited to CAD / CABG 2001, non-STEMI 2009 status post PCI. CHF with EF < 15%, MD, hyperlipidemia, and obesity  Patient presents to the emergency department with complaints of lower chest/epigastric pain present for about 4 weeks. The pain seems to be most at night when patient in supine position. He relates the pain to eyedrops that he has been using over the last several weeks following eye surgery. Sitting up eases the pain but then patient gets short of breath. The symptoms have been ongoing several weeks but yesterday symptoms did not resolve, hence reason for ED visit. Prior to a few weeks ago patient had no abdominal pain or shortness of breath he states. No nausea or vomiting. Patient was constipated a few weeks back but that has resolved.   Assessment & Plan   Atypical chest/epigastric pain -Likely secondary to volume overload -Patient currently chest pain-free -Troponin cycled and negative -Cardiology consult appreciated. Felt symptoms likely to be secondary to volume overload, recommended diuresis. -Continue Plavix, aspirin  Acute on chronic systolic heart failure/ischemic cardiomyopathy -Last EF 15% -Initially placed on IV Lasix -Continue to monitor intake and output -Urine output over the past 24 hours: 3320cc -Patient transitioned to by mouth Lasix, will continue to monitor diuresing closely  Diabetes mellitus, type II -Metformin held  -Continue insulin sliding scale CBG  monitoring  Hypertension -Continue Lasix, lisinopril, metoprolol  Hyperlipidemia -Continue statin  Chronic lumbar pain -Continue ketorolac  Code Status: full  Family Communication: None at bedside  Disposition Plan: Admitted.  Transitioned to PO lasix today.  Time Spent in minutes   30 minutes  Procedures  None  Consults   Cardiology  DVT Prophylaxis  Lovenox  Lab Results  Component Value Date   PLT 215 10/23/2015    Medications  Scheduled Meds: . aspirin  325 mg Oral Daily  . brimonidine  1 drop Right Eye BID  . clopidogrel  75 mg Oral Daily  . enoxaparin (LOVENOX) injection  40 mg Subcutaneous Q24H  . furosemide  40 mg Oral BID  . insulin aspart  0-9 Units Subcutaneous TID WC  . ketorolac  1 drop Right Eye QID  . lisinopril  5 mg Oral Daily  . metoprolol tartrate  25 mg Oral BID  . ofloxacin  1 drop Right Eye QID  . prednisoLONE acetate  1 drop Right Eye QID  . rosuvastatin  40 mg Oral q1800  . senna  1 tablet Oral BID   Continuous Infusions:  PRN Meds:.acetaminophen, bisacodyl, morphine injection, ondansetron **OR** ondansetron (ZOFRAN) IV, zolpidem  Antibiotics  Anti-infectives    None      Subjective:   [name] [name] seen and examined today.  Patient states he's feeling much better today. Would like to go home. Denies any chest pain at this time. Denies any shortness of breath, abdominal pain, nausea or vomiting, dizziness or headache.   Objective:   Filed Vitals:   10/22/15 2100 10/23/15 0039 10/23/15 0512 10/23/15 1230  BP: 104/64 100/58 129/74 103/63  Pulse: 78 76 86 78  Temp: 98.4 F (36.9 C) 98 F (36.7 C) 97.9 F (36.6 C) 98.7 F (37.1 C)  TempSrc: Oral Oral Oral Oral  Resp: 18 16 16 18   Height:      Weight:   128.323 kg (282 lb 14.4 oz)   SpO2: 98% 100% 97% 96%    Wt Readings from Last 3 Encounters:  10/23/15 128.323 kg (282 lb 14.4 oz)  07/05/15 124.739 kg (275 lb)  01/01/15 128.096 kg (282 lb 6.4 oz)      Intake/Output Summary (Last 24 hours) at 10/23/15 1353 Last data filed at 10/23/15 0900  Gross per 24 hour  Intake   1000 ml  Output    520 ml  Net    480 ml    Exam  General: Well developed, well nourished, NAD, appears stated age  1: NCAT, mucous membranes moist.   Neck: Supple, no JVD, no masses  Cardiovascular: S1 S2 auscultated, no rubs, murmurs or gallops. Regular rate and rhythm.  Respiratory: Clear to auscultation bilaterally with equal chest rise  Abdomen: Soft, nontender, nondistended, + bowel sounds  Extremities: warm dry without cyanosis clubbing. LE edema.  Neuro: AAOx3, nonfocal  Psych: Normal affect and demeanor   Data Review   Micro Results No results found for this or any previous visit (from the past 240 hour(s)).  Radiology Reports Dg Chest 2 View  10/22/2015  CLINICAL DATA:  Acute onset of generalized chest pain and shortness of breath. Initial encounter. EXAM: CHEST  2 VIEW COMPARISON:  Chest radiograph performed 10/28/2013 FINDINGS: The lungs are well-aerated. Vascular congestion is noted. Increased interstitial markings raise concern for mild interstitial edema. There is no evidence of pleural effusion or pneumothorax. The heart is enlarged. The patient is status post median sternotomy, with evidence of prior CABG. No acute osseous abnormalities are seen. IMPRESSION: Vascular congestion and cardiomegaly. Increased interstitial markings raise concern for mild interstitial edema. Electronically Signed   By: Garald Balding M.D.   On: 10/22/2015 03:05    CBC  Recent Labs Lab 10/22/15 0240 10/23/15 0308  WBC 8.8 9.3  HGB 14.7 14.3  HCT 44.5 43.3  PLT 200 215  MCV 93.1 91.0  MCH 30.8 30.0  MCHC 33.0 33.0  RDW 13.9 13.7    Chemistries   Recent Labs Lab 10/22/15 0240 10/22/15 1254 10/23/15 0308  NA 139  --  139  K 4.6  --  4.0  CL 104  --  106  CO2 24  --  24  GLUCOSE 149*  --  150*  BUN 19  --  19  CREATININE 0.89  --   1.06  CALCIUM 9.6  --  8.7*  AST  --  16  --   ALT  --  14*  --   ALKPHOS  --  63  --   BILITOT  --  1.1  --    ------------------------------------------------------------------------------------------------------------------ estimated creatinine clearance is 93.5 mL/min (by C-G formula based on Cr of 1.06). ------------------------------------------------------------------------------------------------------------------ No results for input(s): HGBA1C in the  last 72 hours. ------------------------------------------------------------------------------------------------------------------ No results for input(s): CHOL, HDL, LDLCALC, TRIG, CHOLHDL, LDLDIRECT in the last 72 hours. ------------------------------------------------------------------------------------------------------------------ No results for input(s): TSH, T4TOTAL, T3FREE, THYROIDAB in the last 72 hours.  Invalid input(s): FREET3 ------------------------------------------------------------------------------------------------------------------ No results for input(s): VITAMINB12, FOLATE, FERRITIN, TIBC, IRON, RETICCTPCT in the last 72 hours.  Coagulation profile No results for input(s): INR, PROTIME in the last 168 hours.  No results for input(s): DDIMER in the last 72 hours.  Cardiac Enzymes  Recent Labs Lab 10/22/15 0841 10/22/15 1254 10/22/15 1533  TROPONINI <0.03 <0.03 <0.03   ------------------------------------------------------------------------------------------------------------------ Invalid input(s): POCBNP    Lada Fulbright D.O. on 10/23/2015 at 1:53 PM  Between 7am to 7pm - Pager - [phone]  After 7pm go to [email or web address] - password [email or web address]  And look for the night coverage person covering for me after hours  [facility] Office  [phone]

## 2015-10-24 LAB — CBC
HCT: 44.2 % (ref 39.0–52.0)
Hemoglobin: 14.2 g/dL (ref 13.0–17.0)
MCH: 29.5 pg (ref 26.0–34.0)
MCHC: 32.1 g/dL (ref 30.0–36.0)
MCV: 91.7 fL (ref 78.0–100.0)
PLATELETS: 199 10*3/uL (ref 150–400)
RBC: 4.82 MIL/uL (ref 4.22–5.81)
RDW: 13.8 % (ref 11.5–15.5)
WBC: 8.4 10*3/uL (ref 4.0–10.5)

## 2015-10-24 LAB — BASIC METABOLIC PANEL
Anion gap: 9 (ref 5–15)
BUN: 17 mg/dL (ref 6–20)
CALCIUM: 8.4 mg/dL — AB (ref 8.9–10.3)
CHLORIDE: 108 mmol/L (ref 101–111)
CO2: 24 mmol/L (ref 22–32)
CREATININE: 1.05 mg/dL (ref 0.61–1.24)
Glucose, Bld: 128 mg/dL — ABNORMAL HIGH (ref 65–99)
Potassium: 4.3 mmol/L (ref 3.5–5.1)
SODIUM: 141 mmol/L (ref 135–145)

## 2015-10-24 LAB — GLUCOSE, CAPILLARY
GLUCOSE-CAPILLARY: 123 mg/dL — AB (ref 65–99)
GLUCOSE-CAPILLARY: 128 mg/dL — AB (ref 65–99)

## 2015-10-24 MED ORDER — FUROSEMIDE 40 MG PO TABS: ORAL_TABLET | ORAL | Status: DC

## 2015-10-24 NOTE — Progress Notes (Signed)
SUBJECTIVE: The patient is doing well today.  At this time, he denies chest pain, shortness of breath, or any new concerns. States that he slept well overnight, was able to lie flat, and did not wake up at any time feeling short of breath.    Marland Kitchen aspirin  325 mg Oral Daily  . brimonidine  1 drop Right Eye BID  . clopidogrel  75 mg Oral Daily  . enoxaparin (LOVENOX) injection  40 mg Subcutaneous Q24H  . furosemide  40 mg Oral BID  . insulin aspart  0-9 Units Subcutaneous TID WC  . ketorolac  1 drop Right Eye QID  . lisinopril  5 mg Oral Daily  . metoprolol tartrate  25 mg Oral BID  . ofloxacin  1 drop Right Eye QID  . polyethylene glycol  17 g Oral Daily  . prednisoLONE acetate  1 drop Right Eye QID  . rosuvastatin  40 mg Oral q1800  . senna  1 tablet Oral BID      OBJECTIVE: Physical Exam: Filed Vitals:   10/23/15 2021 10/24/15 0011 10/24/15 0526 10/24/15 0831  BP: 108/73 124/84 99/64 92/57   Pulse: 77 77 80 80  Temp: 98 F (36.7 C) 97.6 F (36.4 C) 97.9 F (36.6 C) 98 F (36.7 C)  TempSrc: Oral Oral Oral Oral  Resp: 18 22 18 16   Height:      Weight:   279 lb 12.8 oz (126.916 kg)   SpO2: 99% 98% 97% 97%    Intake/Output Summary (Last 24 hours) at 10/24/15 1027 Last data filed at 10/24/15 0933  Gross per 24 hour  Intake    480 ml  Output   1375 ml  Net   -895 ml    Telemetry reveals sinus rhythm  GEN- The patient is well appearing, alert and oriented x 3 today.   Head- normocephalic, atraumatic Eyes-  Sclera clear, conjunctiva pink Ears- hearing intact Oropharynx- clear Neck- supple, no JVP Lymph- no cervical lymphadenopathy Lungs- Clear to ausculation bilaterally, normal work of breathing Heart- Regular rate and rhythm, no murmurs, rubs or gallops, PMI not laterally displaced GI- soft, NT, ND, + BS Extremities- no clubbing, cyanosis, 2+ edema to the mid calf Skin- no rash or lesion Psych- euthymic mood, full affect Neuro- strength and sensation are  intact  LABS: Basic Metabolic Panel:  Recent Labs  10/23/15 0308 10/24/15 0428  NA 139 141  K 4.0 4.3  CL 106 108  CO2 24 24  GLUCOSE 150* 128*  BUN 19 17  CREATININE 1.06 1.05  CALCIUM 8.7* 8.4*   Liver Function Tests:  Recent Labs  10/22/15 1254  AST 16  ALT 14*  ALKPHOS 63  BILITOT 1.1  PROT 6.8  ALBUMIN 3.6    Recent Labs  10/22/15 1254  LIPASE 31   CBC:  Recent Labs  10/23/15 0308 10/24/15 0428  WBC 9.3 8.4  HGB 14.3 14.2  HCT 43.3 44.2  MCV 91.0 91.7  PLT 215 199   Cardiac Enzymes:  Recent Labs  10/22/15 0841 10/22/15 1254 10/22/15 1533  TROPONINI <0.03 <0.03 <0.03   BNP: Invalid input(s): POCBNP D-Dimer: No results for input(s): DDIMER in the last 72 hours. Hemoglobin A1C: No results for input(s): HGBA1C in the last 72 hours. Fasting Lipid Panel: No results for input(s): CHOL, HDL, LDLCALC, TRIG, CHOLHDL, LDLDIRECT in the last 72 hours. Thyroid Function Tests: No results for input(s): TSH, T4TOTAL, T3FREE, THYROIDAB in the last 72 hours.  Invalid input(s): FREET3 Anemia  Panel: No results for input(s): VITAMINB12, FOLATE, FERRITIN, TIBC, IRON, RETICCTPCT in the last 72 hours.  RADIOLOGY: Dg Chest 2 View  10/22/2015  CLINICAL DATA:  Acute onset of generalized chest pain and shortness of breath. Initial encounter. EXAM: CHEST  2 VIEW COMPARISON:  Chest radiograph performed 10/28/2013 FINDINGS: The lungs are well-aerated. Vascular congestion is noted. Increased interstitial markings raise concern for mild interstitial edema. There is no evidence of pleural effusion or pneumothorax. The heart is enlarged. The patient is status post median sternotomy, with evidence of prior CABG. No acute osseous abnormalities are seen. IMPRESSION: Vascular congestion and cardiomegaly. Increased interstitial markings raise concern for mild interstitial edema. Electronically Signed   By: Garald Balding M.D.   On: 10/22/2015 03:05    ASSESSMENT AND PLAN:   Active Problems:   Hyperlipemia   Cardiomyopathy, ischemic   Chronic lumbar pain   CHF exacerbation (HCC)   Chest pain   Diabetes mellitus, type 2 ([place])  Presented  To the hospital with decompensated heart failure. Currently he is doing much better having diuresed 3.2 L. He has been switched to by mouth  Lasix. Would continue him on oral Lasix currently to see how he diuresis. At this point he feels like he is ready to go home. I have talked to him about his plan for diuresis at home. He should have 80 mg of Lasix daily for 3 days and have a BMP checked at that time. He should restart at 40 mg a day. Will try to arrange cardiology follow-up at the end of next week for beginning of the following week.   Will Meredith Leeds, MD 10/24/2015 10:27 AM

## 2015-10-24 NOTE — Discharge Summary (Signed)
Physician Discharge Summary  [name] [name] [id number] DOB: 01/04/1951 DOA: 10/22/2015  PCP: Sallee Lange, MD  Admit date: 10/22/2015 Discharge date: 10/24/2015  Time spent: 45 minutes  Recommendations for Outpatient Follow-up:  Patient will be discharged to home.  Patient will need to follow up with primary care provider within one week of discharge.  Take lasix 80mg  (2 pills) for 3 days, thereafter, 40mg  daily.  Have your labs, BMP, within 3 days.  Follow up with cardiology in one week.   Patient should continue medications as prescribed.  Patient should follow a heart healthy/carb modified diet.   Discharge Diagnoses:  Atypical chest pain/epigastric pain Acute on chronic systolic heart catheter/ischemic cardiomyopathy Diabetes mellitus, type II Hypertension Hyperlipidemia Chronic lumbar pain  Discharge Condition: Stable  Diet recommendation: Heart healthy/carb modified  Filed Weights   10/22/15 1700 10/23/15 0512 10/24/15 0526  Weight: 127.098 kg (280 lb 3.2 oz) 128.323 kg (282 lb 14.4 oz) 126.916 kg (279 lb 12.8 oz)    History of present illness:  On 10/22/15 by Ms. [name] [name] VAIL ARMOR is a 65 y.o. male With multiple medical problems not limited to CAD / CABG 2001, non-STEMI 2009 status post PCI. CHF with EF < 15%, MD, hyperlipidemia, and obesity  Patient presents to the emergency department with complaints of lower chest/epigastric pain present for about 4 weeks. The pain seems to be most at night when patient in supine position. He relates the pain to eyedrops that he has been using over the last several weeks following eye surgery. Sitting up eases the pain but then patient gets short of breath. The symptoms have been ongoing several weeks but yesterday symptoms did not resolve, hence reason for ED visit. Prior to a few weeks ago patient had no abdominal pain or shortness of breath he states. No nausea or vomiting. Patient was constipated a few weeks back  but that has resolved.   Hospital Course:  Atypical chest/epigastric pain -Likely secondary to volume overload -Patient currently chest pain-free -Troponin cycled and negative -Cardiology consult appreciated. Felt symptoms likely to be secondary to volume overload, recommended diuresis. -Continue Plavix, aspirin  Acute on chronic systolic heart failure/ischemic cardiomyopathy -Last EF 15% -Repeat echocardiogram: EF [id number], grade 3 diastolic dysfunction, hypokinesis, apical akinesis, mild AS/TR/PR -Initially placed on IV Lasix -Continue to monitor intake and output -Urine output over the past 24 hours: 1375cc -Patient transitioned to by mouth Lasix -Follow up with cardiology in one week.  Repeat BMP in 3 days -Cardiology recommended lasix 80mg  daily for 3 days, followed by 40mg  daily.  Diabetes mellitus, type II -Metformin held  -Continue insulin sliding scale CBG monitoring  Hypertension -Continue Lasix, lisinopril, metoprolol  Hyperlipidemia -Continue statin  Chronic lumbar pain -Continue ketorolac  Procedures: Echocardiogram  Consultations: Cardiology  Discharge Exam: Filed Vitals:   10/24/15 0526 10/24/15 0831  BP: 99/64 92/57  Pulse: 80 80  Temp: 97.9 F (36.6 C) 98 F (36.7 C)  Resp: 18 16    Exam  General: Well developed, well nourished, NAD  HEENT: NCAT, mucous membranes moist.   Cardiovascular: S1 S2 auscultated, RRR, no murmurs  Respiratory: Clear to auscultation bilaterally with equal chest rise  Abdomen: Soft, obese, nontender, nondistended, + bowel sounds  Extremities: warm dry without cyanosis clubbing. 1-2+LE edema B/L   Neuro: AAOx3, nonfocal  Psych: Normal affect and demeanor, pleasant  Discharge Instructions      Discharge Instructions    Discharge instructions    Complete by:  As directed  Patient will be discharged to home.  Patient will need to follow up with primary care provider within one week of discharge.  Take  lasix 80mg  (2 pills) for 3 days, thereafter, 40mg  daily.  Have your labs, BMP, within 3 days.  Follow up with cardiology in one week.   Patient should continue medications as prescribed.  Patient should follow a heart healthy/carb modified diet.            Medication List    TAKE these medications        aspirin 325 MG tablet  Take 325 mg by mouth daily.     brimonidine 0.1 % Soln  Commonly known as:  ALPHAGAN P  Place 1 drop into the right eye 2 (two) times daily.     CENTRUM SILVER PO  Take 1 tablet by mouth daily.     clopidogrel 75 MG tablet  Commonly known as:  PLAVIX  TAKE ONE TABLET BY MOUTH DAILY.     furosemide 40 MG tablet  Commonly known as:  LASIX  Take 2 tablets for 3 days.  On 10/27/2015, start taking one tablet daily.     ketorolac 0.5 % ophthalmic solution  Commonly known as:  ACULAR  Place 1 drop into the right eye 4 (four) times daily.     lisinopril 5 MG tablet  Commonly known as:  PRINIVIL,ZESTRIL  Take 1 tablet (5 mg total) by mouth daily.     metoprolol tartrate 25 MG tablet  Commonly known as:  LOPRESSOR  Take 1 tablet (25 mg total) by mouth 2 (two) times daily.     NITROSTAT 0.4 MG SL tablet  Generic drug:  nitroGLYCERIN  PLACE 1 TABLET UNDER TONGUE FOR CHEST PAIN. MAY REPEAT EVERY 5 MIN UPTO 3 DOSES-NO RELIEF,CALL 911.     ofloxacin 0.3 % ophthalmic solution  Commonly known as:  OCUFLOX  Place 1 drop into the right eye every 2 (two) hours.     prednisoLONE acetate 1 % ophthalmic suspension  Commonly known as:  PRED FORTE  Place 1 drop into the right eye every 2 (two) hours.     rosuvastatin 40 MG tablet  Commonly known as:  CRESTOR  Take 1 tablet (40 mg total) by mouth daily.       Allergies  Allergen Reactions  . Dyflex-G [Dyphylline-Guaifenesin] Hives   Follow-up Information    Follow up with Dorris Carnes, MD.   Specialty:  Cardiology   Why:  The office will call you on Monday to schedule your follow-up appointment.   Contact  information:   [place] [address] [phone]        The results of significant diagnostics from this hospitalization (including imaging, microbiology, ancillary and laboratory) are listed below for reference.    Significant Diagnostic Studies: Dg Chest 2 View  10/22/2015  CLINICAL DATA:  Acute onset of generalized chest pain and shortness of breath. Initial encounter. EXAM: CHEST  2 VIEW COMPARISON:  Chest radiograph performed 10/28/2013 FINDINGS: The lungs are well-aerated. Vascular congestion is noted. Increased interstitial markings raise concern for mild interstitial edema. There is no evidence of pleural effusion or pneumothorax. The heart is enlarged. The patient is status post median sternotomy, with evidence of prior CABG. No acute osseous abnormalities are seen. IMPRESSION: Vascular congestion and cardiomegaly. Increased interstitial markings raise concern for mild interstitial edema. Electronically Signed   By: Garald Balding M.D.   On: 10/22/2015 03:05    Microbiology:  No results found for this or any previous visit (from the past 240 hour(s)).   Labs: Basic Metabolic Panel:  Recent Labs Lab 10/22/15 0240 10/23/15 0308 10/24/15 0428  NA 139 139 141  K 4.6 4.0 4.3  CL 104 106 108  CO2 24 24 24   GLUCOSE 149* 150* 128*  BUN 19 19 17   CREATININE 0.89 1.06 1.05  CALCIUM 9.6 8.7* 8.4*   Liver Function Tests:  Recent Labs Lab 10/22/15 1254  AST 16  ALT 14*  ALKPHOS 63  BILITOT 1.1  PROT 6.8  ALBUMIN 3.6    Recent Labs Lab 10/22/15 1254  LIPASE 31   No results for input(s): AMMONIA in the last 168 hours. CBC:  Recent Labs Lab 10/22/15 0240 10/23/15 0308 10/24/15 0428  WBC 8.8 9.3 8.4  HGB 14.7 14.3 14.2  HCT 44.5 43.3 44.2  MCV 93.1 91.0 91.7  PLT 200 215 199   Cardiac Enzymes:  Recent Labs Lab 10/22/15 0240 10/22/15 0841 10/22/15 1254 10/22/15 1533  TROPONINI <0.03 <0.03 <0.03 <0.03   BNP: BNP (last  3 results)  Recent Labs  10/22/15 0413  BNP 547.7*    ProBNP (last 3 results) No results for input(s): PROBNP in the last 8760 hours.  CBG:  Recent Labs Lab 10/23/15 1229 10/23/15 1544 10/23/15 2032 10/24/15 0631 10/24/15 1103  GLUCAP 119* 148* 129* 123* 128*       Signed:  Cristal Ford  [facility] 10/24/2015, 1:51 PM

## 2015-11-03 ENCOUNTER — Ambulatory Visit (INDEPENDENT_AMBULATORY_CARE_PROVIDER_SITE_OTHER): Payer: Medicare Other | Admitting: Cardiology

## 2015-11-03 ENCOUNTER — Encounter: Payer: BLUE CROSS/BLUE SHIELD | Admitting: Cardiology

## 2015-11-03 ENCOUNTER — Encounter: Payer: Self-pay | Admitting: Cardiology

## 2015-11-03 VITALS — BP 108/80 | HR 82 | Ht 70.0 in | Wt 279.8 lb

## 2015-11-03 DIAGNOSIS — I5043 Acute on chronic combined systolic (congestive) and diastolic (congestive) heart failure: Secondary | ICD-10-CM | POA: Diagnosis not present

## 2015-11-03 DIAGNOSIS — I509 Heart failure, unspecified: Secondary | ICD-10-CM | POA: Diagnosis not present

## 2015-11-03 LAB — BASIC METABOLIC PANEL
BUN: 13 mg/dL (ref 7–25)
CO2: 31 mmol/L (ref 20–31)
Calcium: 9 mg/dL (ref 8.6–10.3)
Chloride: 99 mmol/L (ref 98–110)
Creat: 1.05 mg/dL (ref 0.70–1.25)
Glucose, Bld: 79 mg/dL (ref 65–99)
POTASSIUM: 3.8 mmol/L (ref 3.5–5.3)
Sodium: 139 mmol/L (ref 135–146)

## 2015-11-03 NOTE — Patient Instructions (Signed)
Medication Instructions:  Your physician recommends that you continue on your current medications as directed. Please refer to the Current Medication list given to you today.   Labwork: Bmet today  Testing/Procedures: None ordered  Follow-Up: Your physician recommends that you schedule a follow-up appointment in: 3 months with Dr.Nishan   Any Other Special Instructions Will Be Listed Below (If Applicable).     If you need a refill on your cardiac medications before your next appointment, please call your pharmacy.

## 2015-11-03 NOTE — Progress Notes (Signed)
11/03/2015 [place]   [birth year]  [id number]  Primary Physician Sallee Lange, MD Primary Cardiologist:  Dr. 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 is a morbidly obese 65 yo Caucasian male with a past medical history of hypertension, hyperlipidemia, diabetes, ischemic cardiomyopathy with EF 15% and CAD s/p CABG x 6 in 2001 by Dr. Roxy Manns (LIMA to distal LAD, SVG to RI, SVG to OM2, seq SVG to RPLA, SVG to acute marginal, SVG to posterior descending artery). He was admitted with NSTEMI in 2009 and underwent PTCA/BMS to proximal and mid SVG to OM 2 and PLA as well as PTCA/Promus stent to proximal and mid vein graft to diagonal and vein graft to ramus. Echocardiogram in March 2015 showed EF less than 15%. He was seen by Dr. Johnsie Cancel for cardiac clearance on 12/16/2014. As part of preop clearance, he underwent cardiac catheterization on 12/21/2014 which showed widely patent LIMA to LAD, patent SVG to posterior lateral branch, the sequential SVG to acute marginal was patent however had totally occluded limb to PDA, eccentric 50-70% stenosis proximal to a previously placed stent in SVG to ramus intermedius. LV gram was not done due to inability to cross into the left ventricle.  He recently presented to [facility] hospital with orthopnea, PND and epigastric discomfort. He was felt to be fluid overloaded based on interstitial edema that was seen on chest x-ray. Troponin was negative 2. EKG showed no significant ST-T wave changes. BNP was mildly elevated at 547.7. He was treated with IV diuretics. His symptoms including his chest discomfort resolved. 2D echo was repeated which showed continued LV dysfunction with an EF of 10-15% and G3DD. Once euvolmeic, he was transitioned to oral lasix. His discharge weight was 279 lb. He was discharged home on 80 mg of Lasix daily x 3 days, followed by 40 mg daily. He is due for f/u BMP today.   He  presents to clinic today for post hospital follow-up. He is accompanied by his wife. He has done well since discharge. He denies any recurrent dyspnea. No further weight gain or lower extremity edema. He denies orthopnea or PND. He has been fully compliant with a low-sodium diet as well as with daily weights. He reports that his weight has continued to decrease since discharge. His baseline weight at home without clothing is 273 pounds. His office weight today fully closed with steel toed boots is 279 pounds. He reports good urine output with Lasix. He has been fully compliant with the rest of his cardiac meds.   Current Outpatient Prescriptions  Medication Sig Dispense Refill  . aspirin 325 MG tablet Take 325 mg by mouth daily.    . brimonidine (ALPHAGAN P) 0.1 % SOLN Place 1 drop into the right eye 2 (two) times daily.    . clopidogrel (PLAVIX) 75 MG tablet TAKE ONE TABLET BY MOUTH DAILY. 30 tablet 5  . furosemide (LASIX) 40 MG tablet Take 2 tablets for 3 days.  On 10/27/2015, start taking one tablet daily. 45 tablet 0  . ketorolac (ACULAR) 0.5 % ophthalmic solution Place 1 drop into the right eye 4 (four) times daily.    Marland Kitchen lisinopril (PRINIVIL,ZESTRIL) 5 MG tablet Take 1 tablet (5 mg total) by mouth daily. 30 tablet 5  . metoprolol tartrate (LOPRESSOR) 25 MG tablet Take 1 tablet (25 mg total) by mouth 2 (two) times daily. 60 tablet 5  .  Multiple Vitamins-Minerals (CENTRUM SILVER PO) Take 1 tablet by mouth daily.     Marland Kitchen NITROSTAT 0.4 MG SL tablet PLACE 1 TABLET UNDER TONGUE FOR CHEST PAIN. MAY REPEAT EVERY 5 MIN UPTO 3 DOSES-NO RELIEF,CALL 911. 25 tablet 1  . ofloxacin (OCUFLOX) 0.3 % ophthalmic solution Place 1 drop into the right eye every 2 (two) hours.    . prednisoLONE acetate (PRED FORTE) 1 % ophthalmic suspension Place 1 drop into the right eye every 2 (two) hours.    . rosuvastatin (CRESTOR) 40 MG tablet Take 1 tablet (40 mg total) by mouth daily. 30 tablet 3   No current  facility-administered medications for this visit.    Allergies  Allergen Reactions  . Dyflex-G [Dyphylline-Guaifenesin] Hives    Social History   Social History  . Marital Status: Married    Spouse Name: N/A  . Number of Children: N/A  . Years of Education: N/A   Occupational History  . Not on file.   Social History Main Topics  . Smoking status: Former Research scientist (life sciences)  . Smokeless tobacco: Not on file  . Alcohol Use: No  . Drug Use: No  . Sexual Activity: Not on file   Other Topics Concern  . Not on file   Social History Narrative     Review of Systems: General: negative for chills, fever, night sweats or weight changes.  Cardiovascular: negative for chest pain, dyspnea on exertion, edema, orthopnea, palpitations, paroxysmal nocturnal dyspnea or shortness of breath Dermatological: negative for rash Respiratory: negative for cough or wheezing Urologic: negative for hematuria Abdominal: negative for nausea, vomiting, diarrhea, bright red blood per rectum, melena, or hematemesis Neurologic: negative for visual changes, syncope, or dizziness All other systems reviewed and are otherwise negative except as noted above.    Blood pressure 108/80, pulse 82, height 5\' 10"  (1.778 m), weight 279 lb 12.8 oz (126.916 kg), SpO2 97 %.  General appearance: alert, cooperative, no distress and moderately obese Neck: no carotid bruit and no JVD Lungs: clear to auscultation bilaterally Heart: regular rate and rhythm, S1, S2 normal, no murmur, click, rub or gallop Extremities: no LEE Pulses: 2+ and symmetric Skin: warm and dry Neurologic: Grossly normal  EKG not performed  ASSESSMENT AND PLAN:   1. 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 He denies any recurrent dyspnea or lower extremity edema. No orthopnea or PND. His weight today is stable and the same as his discharge weight at 279 lb. Continue daily lasix, + low sodium diet and  continue daily weights. We will recheck a BMP today to reassess kidney function and electrolytes.  2. CAD: status post CABG in the past followed by coronary stenting as outlined above. The patient is stable without any anginal symptoms. Continue medical therapy.  3. HTN: well controlled on current regimen.    PLAN  F/u with Dr. Johnsie Cancel in 3 months.   Lyda Jester PA-C 11/03/2015 2:41 PM

## 2015-11-23 DIAGNOSIS — R972 Elevated prostate specific antigen [PSA]: Secondary | ICD-10-CM | POA: Diagnosis not present

## 2015-11-26 ENCOUNTER — Ambulatory Visit (INDEPENDENT_AMBULATORY_CARE_PROVIDER_SITE_OTHER): Payer: Medicare Other | Admitting: Urology

## 2015-11-26 DIAGNOSIS — R972 Elevated prostate specific antigen [PSA]: Secondary | ICD-10-CM | POA: Diagnosis not present

## 2015-11-26 DIAGNOSIS — R351 Nocturia: Secondary | ICD-10-CM | POA: Diagnosis not present

## 2015-11-26 DIAGNOSIS — N401 Enlarged prostate with lower urinary tract symptoms: Secondary | ICD-10-CM

## 2015-12-08 ENCOUNTER — Other Ambulatory Visit: Payer: Self-pay | Admitting: Family Medicine

## 2015-12-13 ENCOUNTER — Other Ambulatory Visit: Payer: Self-pay | Admitting: Cardiovascular Disease

## 2015-12-23 ENCOUNTER — Encounter: Payer: Self-pay | Admitting: Cardiovascular Disease

## 2015-12-25 ENCOUNTER — Other Ambulatory Visit: Payer: Self-pay | Admitting: Family Medicine

## 2015-12-31 ENCOUNTER — Other Ambulatory Visit: Payer: Self-pay | Admitting: *Deleted

## 2015-12-31 MED ORDER — FUROSEMIDE 40 MG PO TABS: 40.0000 mg | ORAL_TABLET | Freq: Every day | ORAL | Status: DC

## 2015-12-31 NOTE — Telephone Encounter (Signed)
Patient stated he was told to take his Lasix 40 mg by mouth on Sunday, Tuesday, Thursday, and Lasix 80 mg Monday, Wednesday, and Friday. I did not see where this change was made. Patient stated it started in the hospital and that he is running out of his medication. Will forward to Dr. Johnsie Cancel to see what dosage patient needs to be on.

## 2015-12-31 NOTE — Telephone Encounter (Signed)
Pharmacy states that patient takes medication 1 tablet Sun,Tues, Thurs, and 2 tablets Mon, Wed, Friday. Patient is running out of med and reports a 8lb weight gain. Please advise.

## 2016-01-13 ENCOUNTER — Other Ambulatory Visit: Payer: Self-pay | Admitting: Family Medicine

## 2016-02-01 NOTE — Progress Notes (Signed)
Cardiology Office Note    Date:  02/03/2016   ID:  [name] [name], DOB 1951-03-17, MRN [id number]  PCP:  Sallee Lange, MD  Cardiologist:  Dr. Johnsie Cancel    3 month follow up.    History of Present Illness:  [name] [name] is a 65 y.o. male with a history of morbid obesity, HTN, HLD, DMT2, ICM (EF 15%) and CAD s/p CABG x 6 in 2001 and subsequent PCIs who presents to clinic for 3 month follow up.   In 2001, he underwent CABG x6V by Dr. Roxy Manns (LIMA to distal LAD, SVG to RI, SVG to OM2, seq SVG to RPLA, SVG to acute marginal, SVG to posterior descending artery). He was admitted with NSTEMI in 2009 and underwent PTCA/BMS to proximal and mid SVG to OM 2 and PLA as well as PTCA/Promus stent to proximal and mid vein graft to diagonal and vein graft to ramus. Echocardiogram in March 2015 showed EF less than 15%. He was seen by Dr. Johnsie Cancel for cardiac clearance on 12/16/2014. As part of preop clearance, he underwent cardiac catheterization on 12/21/2014 which showed widely patent LIMA to LAD, patent SVG to posterior lateral branch, the sequential SVG to acute marginal was patent however had totally occluded limb to PDA, eccentric 50-70% stenosis proximal to a previously placed stent in SVG to ramus intermedius. LV gram was not done due to inability to cross into the left ventricle.  He was admitted to [facility] hospital in 09/2015 with A/C CHF. He was treated with IV diuretics. His symptoms including his chest discomfort resolved. 2D echo was repeated which showed continued LV dysfunction with an EF of 10-15% and G3DD. Once euvolmeic, he was transitioned to oral lasix. His discharge weight was 279 lb. He was discharged home on 80 mg of Lasix daily x 3 days, followed by 40 mg daily.   He was seen by Mare Loan PA-C on 11/03/15 for post hosp follow up. His weight at home was 273 and 279 ( with steel toe boots) in office. He was felt to be euvolemic. He was kept on Lasix 40mg  daily with extra lasix for fluid or weight  gain. BMET showed normal creat ~1.   Today he presents to clinic for follow up. He is upset because there was a misunderstanding with the pharmacy and he wasn't able to fill his lasix. Historically, he has always taken Lasix 40 mg daily with an extra if he had signs or symptoms of heart failure or weight gain. When the prescription was refilled it was only filled for a 30 day supply so he has not been able to take extra. This upsets him because he is very good at taking care of his heart failure and keeping his weight stable. He's had no chest pain. When he ran out of the Lasix for 2 weeks he did have signs of fluid overload with lower extremity edema, orthopnea and PND. However this has improved since refilling his Lasix. His weight has been up about 10 pounds. He has had no dizziness or syncope. He has had no palpitations. Also had a long discussion about ICD for primary prevention of sudden cardiac death with a persistent low EF.    Past Medical History  Diagnosis Date  . CAD (coronary artery disease)   . Hyperlipidemia   . Elevated PSA   . Diabetes mellitus without complication (HCC)     prediabetes  . CHF (congestive heart failure) ([place])   . OSA (obstructive sleep apnea)   .  Chronic back pain   . Morbid obesity ([place])   . Cardiomyopathy, ischemic     Past Surgical History  Procedure Laterality Date  . Appendectomy    . Ingrown toenail N/A   . Carotid stent    . Left heart catheterization with coronary/graft angiogram N/A 12/21/2014    Procedure: LEFT HEART CATHETERIZATION WITH Beatrix Fetters;  Surgeon: Belva Crome, MD;  Location: [facility] CATH LAB;  Service: Cardiovascular;  Laterality: N/A;  . Coronary artery bypass graft    . Cataract extraction, bilateral      Current Medications: Outpatient Prescriptions Prior to Visit  Medication Sig Dispense Refill  . aspirin 325 MG tablet Take 325 mg by mouth daily.    . clopidogrel (PLAVIX) 75 MG tablet TAKE ONE TABLET BY MOUTH  DAILY. 30 tablet 5  . lisinopril (PRINIVIL,ZESTRIL) 5 MG tablet Take 1 tablet (5 mg total) by mouth daily. 30 tablet 5  . metoprolol tartrate (LOPRESSOR) 25 MG tablet Take 1 tablet (25 mg total) by mouth 2 (two) times daily. 60 tablet 5  . Multiple Vitamins-Minerals (CENTRUM SILVER PO) Take 1 tablet by mouth daily.     Marland Kitchen ofloxacin (OCUFLOX) 0.3 % ophthalmic solution Place 1 drop into the right eye every 2 (two) hours.    . rosuvastatin (CRESTOR) 40 MG tablet TAKE ONE TABLET BY MOUTH DAILY. 30 tablet 0  . brimonidine (ALPHAGAN P) 0.1 % SOLN Place 1 drop into the right eye 2 (two) times daily.    . furosemide (LASIX) 40 MG tablet Take 1 tablet (40 mg total) by mouth daily. 30 tablet 2  . ketorolac (ACULAR) 0.5 % ophthalmic solution Place 1 drop into the right eye 4 (four) times daily.    . prednisoLONE acetate (PRED FORTE) 1 % ophthalmic suspension Place 1 drop into the right eye every 2 (two) hours.    Marland Kitchen NITROSTAT 0.4 MG SL tablet PLACE 1 TABLET UNDER TONGUE FOR CHEST PAIN. MAY REPEAT EVERY 5 MIN UPTO 3 DOSES-NO RELIEF,CALL 911. 25 tablet 0   No facility-administered medications prior to visit.     Allergies:   Dyflex-g   Social History   Social History  . Marital Status: Married    Spouse Name: N/A  . Number of Children: N/A  . Years of Education: N/A   Social History Main Topics  . Smoking status: Former Research scientist (life sciences)  . Smokeless tobacco: None  . Alcohol Use: No  . Drug Use: No  . Sexual Activity: Not Asked   Other Topics Concern  . None   Social History Narrative     Family History:  The patient'sHe was adopted. Family history is unknown by patient.   ROS:   Please see the history of present illness.    ROS All other systems reviewed and are negative.   PHYSICAL EXAM:   VS:  BP 122/78 mmHg  Pulse 78  Ht 5\' 10"  (1.778 m)  Wt 290 lb (131.543 kg)  BMI 41.61 kg/m2   GEN: Well nourished, well developed, in no acute distressobese HEENT: normal Neck: no JVD, carotid bruits,  or masses Cardiac: RRR; no murmurs, rubs, or gallops,no edema  Respiratory:  clear to auscultation bilaterally, normal work of breathing GI: soft, nontender, nondistended, + BS MS: no deformity or atrophy Skin: warm and dry, no rash Neuro:  Alert and Oriented x 3, Strength and sensation are intact Psych: euthymic mood, full affect  Wt Readings from Last 3 Encounters:  02/03/16 290 lb (131.543 kg)  11/03/15 279  lb 12.8 oz (126.916 kg)  10/24/15 279 lb 12.8 oz (126.916 kg)      Studies/Labs Reviewed:   EKG:  EKG is NOT ordered today.    Recent Labs: 10/22/2015: ALT 14*; B Natriuretic Peptide 547.7* 10/24/2015: Hemoglobin 14.2; Platelets 199 11/03/2015: BUN 13; Creat 1.05; Potassium 3.8; Sodium 139   Lipid Panel    Component Value Date/Time   CHOL 207* 07/06/2015 0937   CHOL 118 01/02/2015 1010   TRIG 212* 07/06/2015 0937   HDL 37* 07/06/2015 0937   HDL 30* 01/02/2015 1010   CHOLHDL 5.6* 07/06/2015 0937   CHOLHDL 3.9 01/02/2015 1010   VLDL 31 01/02/2015 1010   LDLCALC 128* 07/06/2015 0937   LDLCALC 57 01/02/2015 1010    Additional studies/ records that were reviewed today include:  2D ECHO: 10/23/2015 LV EF: 10% - 15% Study Conclusions - Left ventricle: The cavity size was severely dilated. Systolic  function was severely reduced. The estimated ejection fraction  was in the range of 10% to 15%. Severe diffuse hypokinesis with  apical akinesis. Doppler parameters are consistent with a  reversible restrictive pattern, indicative of decreased left  ventricular diastolic compliance and/or increased left atrial  pressure (grade 3 diastolic dysfunction). - Aortic valve: Calcification. There was very mild stenosis. Valve  area (VTI): 1.09 cm^2. Valve area (Vmax): 1.13 cm^2. Valve area  (Vmean): 1.04 cm^2. - Left atrium: The atrium was severely dilated. - Right ventricle: The cavity size was normal. Wall thickness was  normal. Systolic function was moderately  reduced. - Right atrium: The atrium was moderately dilated. - Atrial septum: No defect or patent foramen ovale was identified  by color flow Doppler. - Tricuspid valve: There was mild regurgitation. - Pulmonic valve: There was mild regurgitation. - Pulmonary arteries: Systolic pressure was moderately increased.  PA peak pressure: 43 mm Hg (S). - Pericardium, extracardiac: A trivial pericardial effusion was  identified.    ASSESSMENT & PLAN:  In order of problems listed above:  Chronic Combined Systolic + Diastolic CHF: Patient is euvolemic on physical exam, but his weight is up ~11 lbs since last visit. This is likely due to the fact that he was out of Lasix for 2 weeks. Now that he has his prescription filled, we will plan for him to take an extra Lasix until he gets closer to his dry weight. I have arranged for BMET next week to follow his kidney and renal function with extra lasix. -- Continue BB and ACE as well as daily weights and low sodium diet.  -- His EF has been low ~15% since 2015. Per review of old notes, he declined ICD in the past with Dr. Johnsie Cancel. We had a long discussion about his low EF and ICD for primary prevention of sudden cardiac death. He is now open to the possibility. I will have him set up with one of our EP physicians to further discuss ICD.  CAD: status post CABG in the past followed by coronary stenting as outlined above. The patient is stable without any anginal symptoms. Continue medical therapy with ASA/plavix, statin and BB  HTN: well controlled on current regimen.   HLD: continue statin. Lipids followed by PCP  Obesity: diet and exercise recommended.   Pre-diabetes: diet and weight loss.    Medication Adjustments/Labs and Tests Ordered: Current medicines are reviewed at length with the patient today.  Concerns regarding medicines are outlined above.  Medication changes, Labs and Tests ordered today are listed in the Patient Instructions  below. Patient Instructions  Medication Instructions:  Your physician recommends that you continue on your current medications as directed. Please refer to the Current Medication list given to you today.   Labwork: 1 WEEK:  BMET (PRESCRIPTION GIVEN TO PT)    Testing/Procedures: None ordered  Follow-Up: Your physician recommends that you schedule a follow-up appointment in: [place] Your physician recommends that you schedule a follow-up appointment in: 3 MONTHS WITH DR. Johnsie Cancel    Any Other Special Instructions Will Be Listed Below (If Applicable).    If you need a refill on your cardiac medications before your next appointment, please call your pharmacy.       Signed, Angelena Form, PA-C  02/03/2016 9:03 AM    [facility] [place], [place], [place]  [address] Phone: [phone]; Fax: [phone]

## 2016-02-03 ENCOUNTER — Ambulatory Visit: Payer: Medicare Other | Admitting: Cardiovascular Disease

## 2016-02-03 ENCOUNTER — Encounter: Payer: Self-pay | Admitting: Physician Assistant

## 2016-02-03 ENCOUNTER — Ambulatory Visit (INDEPENDENT_AMBULATORY_CARE_PROVIDER_SITE_OTHER): Payer: Medicare Other | Admitting: Physician Assistant

## 2016-02-03 VITALS — BP 122/78 | HR 78 | Ht 70.0 in | Wt 290.0 lb

## 2016-02-03 DIAGNOSIS — I255 Ischemic cardiomyopathy: Secondary | ICD-10-CM | POA: Diagnosis not present

## 2016-02-03 DIAGNOSIS — I5043 Acute on chronic combined systolic (congestive) and diastolic (congestive) heart failure: Secondary | ICD-10-CM

## 2016-02-03 MED ORDER — FUROSEMIDE 40 MG PO TABS: ORAL_TABLET | ORAL | Status: DC

## 2016-02-03 NOTE — Patient Instructions (Addendum)
Medication Instructions:  Your physician recommends that you continue on your current medications as directed. Please refer to the Current Medication list given to you today.   Labwork: 1 WEEK:  BMET (PRESCRIPTION GIVEN TO PT)    Testing/Procedures: None ordered  Follow-Up: Your physician recommends that you schedule a follow-up appointment in: [place] Your physician recommends that you schedule a follow-up appointment in: [place]    Any Other Special Instructions Will Be Listed Below (If Applicable).    If you need a refill on your cardiac medications before your next appointment, please call your pharmacy.

## 2016-02-07 ENCOUNTER — Ambulatory Visit (INDEPENDENT_AMBULATORY_CARE_PROVIDER_SITE_OTHER): Payer: Medicare Other | Admitting: Cardiology

## 2016-02-07 VITALS — BP 96/78 | HR 84 | Ht 70.5 in | Wt 280.0 lb

## 2016-02-07 DIAGNOSIS — I5042 Chronic combined systolic (congestive) and diastolic (congestive) heart failure: Secondary | ICD-10-CM

## 2016-02-07 DIAGNOSIS — I255 Ischemic cardiomyopathy: Secondary | ICD-10-CM | POA: Diagnosis not present

## 2016-02-07 MED ORDER — METOPROLOL SUCCINATE ER 50 MG PO TB24: 50.0000 mg | ORAL_TABLET | Freq: Every day | ORAL | Status: DC

## 2016-02-07 NOTE — Patient Instructions (Addendum)
Medication Instructions:  Your physician has recommended you make the following change in your medication:  1) STOP Metoprolol tartrate 2) START Toprol 50 mg daily  Labwork: None ordered  Testing/Procedures: Your physician has requested that you have an echocardiogram in 3 months. Echocardiography is a painless test that uses sound waves to create images of your heart. It provides your doctor with information about the size and shape of your heart and how well your heart's chambers and valves are working. This procedure takes approximately one hour. There are no restrictions for this procedure.  Follow-Up: Your physician recommends that you schedule a follow-up appointment in: 3 months with Dr. Curt Bears.  (after echo completed)  If you need a refill on your cardiac medications before your next appointment, please call your pharmacy.  Thank you for choosing [facility]!!   Trinidad Curet, RN [phone]

## 2016-02-07 NOTE — Progress Notes (Signed)
Electrophysiology Office Note   Date:  02/07/2016   ID:  [name] [name], DOB 1951/08/25, MRN [id number]  PCP:  [name] Lange, MD  Cardiologist:  [name] [name] Primary Electrophysiologist:  [name] Meredith Leeds, MD    Chief Complaint  Patient presents with  . Advice Only     History of Present Illness: [name] [name] is a 65 y.o. male who presents today for electrophysiology evaluation.   He has a history of morbid obesity, hypertension, hyperlipidemia, type 2 diabetes, ischemic cardiomyopathy with an EF of 15%, coronary disease status post CABG 6 in 2001 and subsequent PCI. He was admitted to [facility] 2/17 with CHF. He was treated with IV diuretics. Echo at that time showed an EF of 10-15%.   Today, he denies symptoms of palpitations, chest pain, shortness of breath, orthopnea, PND, lower extremity edema, claudication, dizziness, presyncope, syncope, bleeding, or neurologic sequela. The patient is tolerating medications without difficulties and is otherwise without complaint today.    Past Medical History  Diagnosis Date  . CAD (coronary artery disease)   . Hyperlipidemia   . Elevated PSA   . Diabetes mellitus without complication (HCC)     prediabetes  . CHF (congestive heart failure) ([place])   . OSA (obstructive sleep apnea)   . Chronic back pain   . Morbid obesity ([place])   . Cardiomyopathy, ischemic    Past Surgical History  Procedure Laterality Date  . Appendectomy    . Ingrown toenail N/A   . Carotid stent    . Left heart catheterization with coronary/graft angiogram N/A 12/21/2014    Procedure: LEFT HEART CATHETERIZATION WITH Beatrix Fetters;  Surgeon: Belva Crome, MD;  Location: [facility] CATH LAB;  Service: Cardiovascular;  Laterality: N/A;  . Coronary artery bypass graft    . Cataract extraction, bilateral       Current Outpatient Prescriptions  Medication Sig Dispense Refill  . aspirin 325 MG tablet Take 325 mg by mouth daily.    . clopidogrel  (PLAVIX) 75 MG tablet TAKE ONE TABLET BY MOUTH DAILY. 30 tablet 5  . furosemide (LASIX) 40 MG tablet TAKE 1 TABLET BY MOUTH DAILY AND TAKE 1 TABLET DAILY AS NEEDED FOR EDEMA 180 tablet 3  . lisinopril (PRINIVIL,ZESTRIL) 5 MG tablet Take 1 tablet (5 mg total) by mouth daily. 30 tablet 5  . metoprolol tartrate (LOPRESSOR) 25 MG tablet Take 1 tablet (25 mg total) by mouth 2 (two) times daily. 60 tablet 5  . Multiple Vitamins-Minerals (CENTRUM SILVER PO) Take 1 tablet by mouth daily.     . nitroGLYCERIN (NITROSTAT) 0.4 MG SL tablet Place 0.4 mg under the tongue every 5 (five) minutes as needed for chest pain (x 3 doses).    Marland Kitchen ofloxacin (OCUFLOX) 0.3 % ophthalmic solution Place 1 drop into the right eye every 2 (two) hours.    . rosuvastatin (CRESTOR) 40 MG tablet TAKE ONE TABLET BY MOUTH DAILY. 30 tablet 0   No current facility-administered medications for this visit.    Allergies:   Dyflex-g   Social History:  The patient  reports that he has quit smoking. He does not have any smokeless tobacco history on file. He reports that he does not drink alcohol or use illicit drugs.   Family History:  The patient's He was adopted. Family history is unknown by patient.    ROS:  Please see the history of present illness.   Otherwise, review of systems is positive for none.   All other  systems are reviewed and negative.    PHYSICAL EXAM: VS:  BP 96/78 mmHg  Pulse 84  Ht 5' 10.5" (1.791 m)  Wt 280 lb (127.007 kg)  BMI 39.59 kg/m2 , BMI Body mass index is 39.59 kg/(m^2). GEN: Well nourished, well developed, in no acute distress HEENT: normal Neck: no JVD, carotid bruits, or masses Cardiac: RRR; no murmurs, rubs, or gallops,no edema  Respiratory:  clear to auscultation bilaterally, normal work of breathing GI: soft, nontender, nondistended, + BS MS: no deformity or atrophy Skin: warm and dry Neuro:  Strength and sensation are intact Psych: euthymic mood, full affect  EKG:  EKG is ordered  today. The ekg ordered today shows sinus rhythm, rate 84, PRWP  Recent Labs: 10/22/2015: ALT 14*; B Natriuretic Peptide 547.7* 10/24/2015: Hemoglobin 14.2; Platelets 199 11/03/2015: BUN 13; Creat 1.05; Potassium 3.8; Sodium 139    Lipid Panel     Component Value Date/Time   CHOL 207* 07/06/2015 0937   CHOL 118 01/02/2015 1010   TRIG 212* 07/06/2015 0937   HDL 37* 07/06/2015 0937   HDL 30* 01/02/2015 1010   CHOLHDL 5.6* 07/06/2015 0937   CHOLHDL 3.9 01/02/2015 1010   VLDL 31 01/02/2015 1010   LDLCALC 128* 07/06/2015 0937   LDLCALC 57 01/02/2015 1010     Wt Readings from Last 3 Encounters:  02/07/16 280 lb (127.007 kg)  02/03/16 290 lb (131.543 kg)  11/03/15 279 lb 12.8 oz (126.916 kg)      Other studies Reviewed: Additional studies/ records that were reviewed today include: TTE 10/23/15 2D ECHO: 10/23/2015 LV EF: 10% - 15% Study Conclusions - Left ventricle: The cavity size was severely dilated. Systolic  function was severely reduced. The estimated ejection fraction  was in the range of 10% to 15%. Severe diffuse hypokinesis with  apical akinesis. Doppler parameters are consistent with a  reversible restrictive pattern, indicative of decreased left  ventricular diastolic compliance and/or increased left atrial  pressure (grade 3 diastolic dysfunction). - Aortic valve: Calcification. There was very mild stenosis. Valve  area (VTI): 1.09 cm^2. Valve area (Vmax): 1.13 cm^2. Valve area  (Vmean): 1.04 cm^2. - Left atrium: The atrium was severely dilated. - Right ventricle: The cavity size was normal. Wall thickness was  normal. Systolic function was moderately reduced. - Right atrium: The atrium was moderately dilated. - Atrial septum: No defect or patent foramen ovale was identified  by color flow Doppler. - Tricuspid valve: There was mild regurgitation. - Pulmonic valve: There was mild regurgitation. - Pulmonary arteries: Systolic pressure was moderately  increased.  PA peak pressure: 43 mm Hg (S). - Pericardium, extracardiac: A trivial pericardial effusion was  identified.   ASSESSMENT AND PLAN:  1.  Ischemic cardiomyopathy: EF is 15%. I discussed with him the options of a defibrillator. And he is agreeable. We discussed the risks and benefits of the procedure. Risks include bleeding, infection, tamponade, pneumothorax. Unfortunately, he has not been on optimal medical therapy, as he has been on Lopressor and not Toprol-XL. We [name] therefore switch him to Toprol-XL today and get a repeat echocardiogram in 3 months. If his EF remains low at that time, we'll schedule him for a defibrillator to be placed.  2. CAD: Currently feeling well with no chest pain  3. Hypertension: Blood pressure currently is low in the 0000000 systolic. We'll continue his current medications as he is asymptomatic.   Current medicines are reviewed at length with the patient today.   The patient does not have  concerns regarding his medicines.  The following changes were made today:  Change lopressor to Toprol XL  Labs/ tests ordered today include:  No orders of the defined types were placed in this encounter.     Disposition:   FU with [name] Camnitz 3 months  Signed, [name] Meredith Leeds, MD  02/07/2016 3:19 PM     [address] [place] [address] [phone] (office) [phone] (fax)

## 2016-02-09 ENCOUNTER — Ambulatory Visit: Payer: Medicare Other | Admitting: Cardiovascular Disease

## 2016-02-15 DIAGNOSIS — H26492 Other secondary cataract, left eye: Secondary | ICD-10-CM | POA: Diagnosis not present

## 2016-02-15 DIAGNOSIS — H26491 Other secondary cataract, right eye: Secondary | ICD-10-CM | POA: Diagnosis not present

## 2016-02-15 DIAGNOSIS — H43813 Vitreous degeneration, bilateral: Secondary | ICD-10-CM | POA: Diagnosis not present

## 2016-02-15 DIAGNOSIS — Z961 Presence of intraocular lens: Secondary | ICD-10-CM | POA: Diagnosis not present

## 2016-02-17 ENCOUNTER — Other Ambulatory Visit: Payer: Self-pay | Admitting: Family Medicine

## 2016-02-22 ENCOUNTER — Other Ambulatory Visit (HOSPITAL_COMMUNITY)
Admission: RE | Admit: 2016-02-22 | Discharge: 2016-02-22 | Disposition: A | Discharge status: Home / Self Care | Payer: Medicare Other | Source: Ambulatory Visit | Attending: Physician Assistant | Admitting: Physician Assistant

## 2016-02-22 DIAGNOSIS — I5043 Acute on chronic combined systolic (congestive) and diastolic (congestive) heart failure: Secondary | ICD-10-CM | POA: Diagnosis not present

## 2016-02-22 DIAGNOSIS — I255 Ischemic cardiomyopathy: Secondary | ICD-10-CM | POA: Diagnosis not present

## 2016-02-22 LAB — BASIC METABOLIC PANEL
Anion gap: 6 (ref 5–15)
BUN: 13 mg/dL (ref 6–20)
CALCIUM: 8.3 mg/dL — AB (ref 8.9–10.3)
CO2: 27 mmol/L (ref 22–32)
CREATININE: 0.84 mg/dL (ref 0.61–1.24)
Chloride: 104 mmol/L (ref 101–111)
GFR calc Af Amer: >60 mL/min (ref >60)
GLUCOSE: 142 mg/dL — AB (ref 65–99)
Potassium: 4 mmol/L (ref 3.5–5.1)
SODIUM: 137 mmol/L (ref 135–145)

## 2016-03-29 ENCOUNTER — Other Ambulatory Visit: Payer: Self-pay | Admitting: Family Medicine

## 2016-04-03 DIAGNOSIS — Z961 Presence of intraocular lens: Secondary | ICD-10-CM | POA: Diagnosis not present

## 2016-04-21 DIAGNOSIS — H26491 Other secondary cataract, right eye: Secondary | ICD-10-CM | POA: Diagnosis not present

## 2016-04-29 ENCOUNTER — Other Ambulatory Visit: Payer: Self-pay | Admitting: Family Medicine

## 2016-05-04 ENCOUNTER — Other Ambulatory Visit (HOSPITAL_COMMUNITY): Payer: Medicare Other

## 2016-05-08 ENCOUNTER — Ambulatory Visit (HOSPITAL_COMMUNITY): Payer: Medicare Other | Attending: Internal Medicine

## 2016-05-08 DIAGNOSIS — Z6839 Body mass index (BMI) 39.0-39.9, adult: Secondary | ICD-10-CM | POA: Diagnosis not present

## 2016-05-08 DIAGNOSIS — Z951 Presence of aortocoronary bypass graft: Secondary | ICD-10-CM | POA: Diagnosis not present

## 2016-05-08 DIAGNOSIS — I340 Nonrheumatic mitral (valve) insufficiency: Secondary | ICD-10-CM | POA: Insufficient documentation

## 2016-05-08 DIAGNOSIS — I110 Hypertensive heart disease with heart failure: Secondary | ICD-10-CM | POA: Diagnosis not present

## 2016-05-08 DIAGNOSIS — I509 Heart failure, unspecified: Secondary | ICD-10-CM | POA: Diagnosis present

## 2016-05-08 DIAGNOSIS — E785 Hyperlipidemia, unspecified: Secondary | ICD-10-CM | POA: Insufficient documentation

## 2016-05-08 DIAGNOSIS — I2510 Atherosclerotic heart disease of native coronary artery without angina pectoris: Secondary | ICD-10-CM | POA: Diagnosis not present

## 2016-05-08 DIAGNOSIS — I358 Other nonrheumatic aortic valve disorders: Secondary | ICD-10-CM | POA: Diagnosis not present

## 2016-05-08 DIAGNOSIS — I5042 Chronic combined systolic (congestive) and diastolic (congestive) heart failure: Secondary | ICD-10-CM | POA: Diagnosis not present

## 2016-05-08 DIAGNOSIS — E119 Type 2 diabetes mellitus without complications: Secondary | ICD-10-CM | POA: Insufficient documentation

## 2016-05-08 DIAGNOSIS — I255 Ischemic cardiomyopathy: Secondary | ICD-10-CM | POA: Diagnosis not present

## 2016-05-08 LAB — ECHOCARDIOGRAM COMPLETE

## 2016-05-08 MED ORDER — PERFLUTREN LIPID MICROSPHERE
1.0000 mL | INTRAVENOUS | Status: AC | PRN
  Administered 2016-05-08: 3 mL via INTRAVENOUS

## 2016-05-09 ENCOUNTER — Ambulatory Visit: Payer: Medicare Other | Admitting: Cardiology

## 2016-05-12 ENCOUNTER — Ambulatory Visit (INDEPENDENT_AMBULATORY_CARE_PROVIDER_SITE_OTHER): Payer: Medicare Other | Admitting: Cardiology

## 2016-05-12 ENCOUNTER — Encounter: Payer: Self-pay | Admitting: *Deleted

## 2016-05-12 ENCOUNTER — Encounter: Payer: Self-pay | Admitting: Cardiology

## 2016-05-12 VITALS — BP 110/72 | HR 76 | Ht 70.0 in | Wt 293.8 lb

## 2016-05-12 DIAGNOSIS — I2589 Other forms of chronic ischemic heart disease: Secondary | ICD-10-CM | POA: Diagnosis not present

## 2016-05-12 DIAGNOSIS — I255 Ischemic cardiomyopathy: Secondary | ICD-10-CM | POA: Diagnosis not present

## 2016-05-12 DIAGNOSIS — Z01812 Encounter for preprocedural laboratory examination: Secondary | ICD-10-CM

## 2016-05-12 LAB — CBC WITH DIFFERENTIAL/PLATELET
BASOS PCT: 0 %
Basophils Absolute: 0 cells/uL (ref 0–200)
EOS PCT: 3 %
Eosinophils Absolute: 207 cells/uL (ref 15–500)
HCT: 44.4 % (ref 38.5–50.0)
HEMOGLOBIN: 15 g/dL (ref 13.2–17.1)
LYMPHS ABS: 1518 {cells}/uL (ref 850–3900)
Lymphocytes Relative: 22 %
MCH: 32.1 pg (ref 27.0–33.0)
MCHC: 33.8 g/dL (ref 32.0–36.0)
MCV: 94.9 fL (ref 80.0–100.0)
MPV: 9.4 fL (ref 7.5–12.5)
Monocytes Absolute: 759 cells/uL (ref 200–950)
Monocytes Relative: 11 %
NEUTROS ABS: 4416 {cells}/uL (ref 1500–7800)
NEUTROS PCT: 64 %
Platelets: 205 10*3/uL (ref 140–400)
RBC: 4.68 MIL/uL (ref 4.20–5.80)
RDW: 14.5 % (ref 11.0–15.0)
WBC: 6.9 10*3/uL (ref 3.8–10.8)

## 2016-05-12 LAB — BASIC METABOLIC PANEL
BUN: 15 mg/dL (ref 7–25)
CHLORIDE: 105 mmol/L (ref 98–110)
CO2: 26 mmol/L (ref 20–31)
Calcium: 9 mg/dL (ref 8.6–10.3)
Creat: 1.1 mg/dL (ref 0.70–1.25)
GLUCOSE: 122 mg/dL — AB (ref 65–99)
POTASSIUM: 5 mmol/L (ref 3.5–5.3)
SODIUM: 140 mmol/L (ref 135–146)

## 2016-05-12 NOTE — Progress Notes (Signed)
Electrophysiology Office Note   Date:  05/12/2016   ID:  QUSAI KEM, DOB 1950/08/30, MRN [id number]  PCP:  Sallee Lange, MD  Cardiologist:  Johnsie Cancel Primary Electrophysiologist:  Agnieszka Newhouse Meredith Leeds, MD    Chief Complaint  Patient presents with  . Follow-up    [facility] CHF     History of Present Illness: [name] [name] is a 65 y.o. male who presents today for electrophysiology evaluation.   He has a history of morbid obesity, hypertension, hyperlipidemia, type 2 diabetes, ischemic cardiomyopathy with an EF of 15%, coronary disease status post CABG 6 in 2001 and subsequent PCI. He was admitted to [facility] 2/17 with CHF. He was treated with IV diuretics. He returns to clinic today feeling well without major complaint. His medications were adjusted and he had an echocardiogram 3 months after the adjustment that showed that his EF still remains low.   Today, he denies symptoms of palpitations, chest pain, shortness of breath, orthopnea, PND, lower extremity edema, claudication, dizziness, presyncope, syncope, bleeding, or neurologic sequela. The patient is tolerating medications without difficulties and is otherwise without complaint today.    Past Medical History:  Diagnosis Date  . CAD (coronary artery disease)   . Cardiomyopathy, ischemic   . CHF (congestive heart failure) ([place])   . Chronic back pain   . Diabetes mellitus without complication (HCC)    prediabetes  . Elevated PSA   . Hyperlipidemia   . Morbid obesity ([place])   . OSA (obstructive sleep apnea)    Past Surgical History:  Procedure Laterality Date  . APPENDECTOMY    . CAROTID STENT    . CATARACT EXTRACTION, BILATERAL    . CORONARY ARTERY BYPASS GRAFT    . ingrown toenail N/A   . LEFT HEART CATHETERIZATION WITH CORONARY/GRAFT ANGIOGRAM N/A 12/21/2014   Procedure: LEFT HEART CATHETERIZATION WITH Beatrix Fetters;  Surgeon: Belva Crome, MD;  Location: [facility] CATH LAB;  Service: Cardiovascular;   Laterality: N/A;     Current Outpatient Prescriptions  Medication Sig Dispense Refill  . aspirin 325 MG tablet Take 325 mg by mouth daily.    . clopidogrel (PLAVIX) 75 MG tablet TAKE ONE TABLET BY MOUTH DAILY. 30 tablet 0  . furosemide (LASIX) 40 MG tablet TAKE 1 TABLET BY MOUTH DAILY AND TAKE 1 TABLET DAILY AS NEEDED FOR EDEMA 180 tablet 3  . lisinopril (PRINIVIL,ZESTRIL) 5 MG tablet TAKE ONE TABLET BY MOUTH ONCE DAILY. 30 tablet 0  . metoprolol succinate (TOPROL-XL) 50 MG 24 hr tablet Take 1 tablet (50 mg total) by mouth daily. Take with or immediately following a meal. 90 tablet 3  . Multiple Vitamins-Minerals (CENTRUM SILVER PO) Take 1 tablet by mouth daily.     . nitroGLYCERIN (NITROSTAT) 0.4 MG SL tablet Place 0.4 mg under the tongue every 5 (five) minutes as needed for chest pain (x 3 doses).    Marland Kitchen ofloxacin (OCUFLOX) 0.3 % ophthalmic solution Place 1 drop into the right eye every 2 (two) hours.    . rosuvastatin (CRESTOR) 40 MG tablet TAKE ONE TABLET BY MOUTH DAILY. 30 tablet 0   No current facility-administered medications for this visit.     Allergies:   Dyflex-g [dyphylline-guaifenesin]   Social History:  The patient  reports that he has quit smoking. He does not have any smokeless tobacco history on file. He reports that he does not drink alcohol or use drugs.   Family History:  The patient's He was adopted. Family history  is unknown by patient.    ROS:  Please see the history of present illness.   Otherwise, review of systems is positive for none.   All other systems are reviewed and negative.    PHYSICAL EXAM: VS:  BP 110/72   Pulse 76   Ht 5\' 10"  (1.778 m)   Wt 293 lb 12.8 oz (133.3 kg)   BMI 42.16 kg/m  , BMI Body mass index is 42.16 kg/m. GEN: Well nourished, well developed, in no acute distress  HEENT: normal  Neck: no JVD, carotid bruits, or masses Cardiac: RRR; no murmurs, rubs, or gallops,no edema  Respiratory:  clear to auscultation bilaterally, normal  work of breathing GI: soft, nontender, nondistended, + BS MS: no deformity or atrophy  Skin: warm and dry Neuro:  Strength and sensation are intact Psych: euthymic mood, full affect  EKG:  EKG is not ordered today. The ekg ordered 6/12/17shows sinus rhythm, rate 84, PRWP, RAA  Recent Labs: 10/22/2015: ALT 14; B Natriuretic Peptide 547.7 10/24/2015: Hemoglobin 14.2; Platelets 199 02/22/2016: BUN 13; Creatinine, Ser 0.84; Potassium 4.0; Sodium 137    Lipid Panel     Component Value Date/Time   CHOL 207 (H) 07/06/2015 0937   TRIG 212 (H) 07/06/2015 0937   HDL 37 (L) 07/06/2015 0937   CHOLHDL 5.6 (H) 07/06/2015 0937   CHOLHDL 3.9 01/02/2015 1010   VLDL 31 01/02/2015 1010   LDLCALC 128 (H) 07/06/2015 0937     Wt Readings from Last 3 Encounters:  05/12/16 293 lb 12.8 oz (133.3 kg)  02/07/16 280 lb (127 kg)  02/03/16 290 lb (131.5 kg)      Other studies Reviewed: Additional studies/ records that were reviewed today include: TTE 05/08/16  - Left ventricle: Extremely poor acoustic windows limit study Even   with contrast use. Overall LVEF is probably moderately to   severely depressed. The cavity size was severely dilated. Wall   thickness was normal. Doppler parameters are consistent with a   reversible restrictive pattern, indicative of decreased left   ventricular diastolic compliance and/or increased left atrial   pressure (grade 3 diastolic dysfunction). - Aortic valve: AV is thckened, calcified . Leaflets are diffcult   to see Peak and mean graidents through the valve are 31 and 22 mm   Hg respectivel y Consider TEE to furgher evaluate. - Mitral valve: There was mild regurgitation. - Left atrium: The atrium was severely dilated. - Pulmonary arteries: PA peak pressure: 36 mm Hg (S).  ASSESSMENT AND PLAN:  1.  Ischemic cardiomyopathy: EF is 15%. I discussed with him the options of a defibrillator. And he is agreeable. We discussed the risks and benefits of the procedure.  Risks include bleeding, infection, tamponade, pneumothorax. As his EF continues to be low, we'll plan for ICD placement. Both him and his wife agree. 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  2. CAD: Currently feeling well with no chest pain  3. Hypertension: Blood pressure currently is low in the 44R systolic. We'll continue his current medications as he is asymptomatic.   Current medicines are reviewed at length with the patient today.   The patient does not have concerns regarding his medicines.  The following changes were made today:  Change lopressor to Toprol XL  Labs/ tests ordered today include:  Orders Placed This Encounter  Procedures  . Basic metabolic panel  .  CBC w/Diff     Disposition:   FU with Sanav Remer 3 months  Signed, Shavaun Osterloh Meredith Leeds, MD  05/12/2016 10:51 AM     [facility] [address] [place] [address] [phone] (office) [phone] (fax)

## 2016-05-12 NOTE — Patient Instructions (Signed)
Medication Instructions:    Your physician recommends that you continue on your current medications as directed. Please refer to the Current Medication list given to you today.  --- If you need a refill on your cardiac medications before your next appointment, please call your pharmacy. ---  Labwork:  Your physician recommends that you return for pre procedure lab work today: BMET and CBC w/ diff  Testing/Procedures: Your physician has recommended that you have a defibrillator inserted. An implantable cardioverter defibrillator (ICD) is a small device that is placed in your chest or, in rare cases, your abdomen. This device uses electrical pulses or shocks to help control life-threatening, irregular heartbeats that could lead the heart to suddenly stop beating (sudden cardiac arrest). Leads are attached to the ICD that goes into your heart. This is done in the hospital and usually requires an overnight stay. Please see the instruction sheet given to you today for more information.  Follow-Up:  Your physician recommends that you schedule a wound check appointment in 10-14 days, after your procedure on 05/18/16, with the device clinic.   Your physician recommends that you schedule a follow up appointment in 3 months, after your procedure on 05/18/16, with Dr. Curt Bears.  Thank you for choosing [facility]!!   Trinidad Curet, RN [phone]  Any Other Special Instructions Will Be Listed Below (If Applicable).   Cardioverter Defibrillator Implantation An implantable cardioverter defibrillator (ICD) is a small, lightweight, battery-powered device that is placed (implanted) under the skin in the chest or abdomen. Your caregiver may prescribe an ICD if:  You have had an irregular heart rhythm (arrhythmia) that originated in the lower chambers of the heart (ventricles).  Your heart has been damaged by a disease (such as coronary artery disease) or heart condition (such as a heart  attack). An ICD consists of a battery that lasts several years, a small computer called a pulse generator, and wires called leads that go into the heart. It is used to detect and correct two dangerous arrhythmias: a rapid heart rhythm (tachycardia) and an arrhythmia in which the ventricles contract in an uncoordinated way (fibrillation). When an ICD detects tachycardia, it sends an electrical signal to the heart that restores the heartbeat to normal (cardioversion). This signal is usually painless. If cardioversion does not work or if the ICD detects fibrillation, it delivers a small electrical shock to the heart (defibrillation) to restart the heart. The shock may feel like a strong jolt in the chest.ICDs may be programmed to correct other problems. Sometimes, ICDs are programmed to act as another type of implantable device called a pacemaker. Pacemakers are used to treat a slow heartbeat (bradycardia). LET YOUR CAREGIVER KNOW ABOUT:  Any allergies you have.  All medicines you are taking, including vitamins, herbs, eyedrops, and over-the-counter medicines and creams.  Previous problems you or members of your family have had with the use of anesthetics.  Any blood disorders you have had.  Other health problems you have. RISKS AND COMPLICATIONS Generally, the procedure to implant an ICD is safe. However, as with any surgical procedure, complications can occur. Possible complications associated with implanting an ICD include:  Swelling, bleeding, or bruising at the site where the ICD was implanted.  Infection at the site where the ICD was implanted.  A reaction to medicine used during the procedure.  Nerve, heart, or blood vessel damage.  Blood clots. BEFORE THE PROCEDURE  You may need to have blood tests, heart tests, or a chest X-ray done  before the day of the procedure.  Ask your caregiver about changing or stopping your regular medicines.  Make plans to have someone drive you home.  You may need to stay in the hospital overnight after the procedure.  Stop smoking at least 24 hours before the procedure.  Take a bath or shower the night before the procedure. You may need to scrub your chest or abdomen with a special type of soap.  Do not eat or drink before your procedure for as long as directed by your caregiver. Ask if it is okay to take any needed medicine with a small sip of water. PROCEDURE  The procedure to implant an ICD in your chest or abdomen is usually done at a hospital in a room that has a large X-ray machine called a fluoroscope. The machine will be above you during the procedure. It will help your caregiver see your heart during the procedure. Implanting an ICD usually takes 1-3 hours. Before the procedure:   Small monitors will be put on your body. They will be used to check your heart, blood pressure, and oxygen level.  A needle will be put into a vein in your hand or arm. This is called an intravenous (IV) access tube. Fluids and medicine will flow directly into your body through the IV tube.  Your chest or abdomen will be cleaned with a germ-killing (antiseptic) solution. The area may be shaved.  You may be given medicine to help you relax (sedative).  You will be given a medicine called a local anesthetic. This medicine will make the surgical site numb while the ICD is implanted. You will be sleepy but awake during the procedure. After you are numb the procedure will begin. The caregiver will:  Make a small cut (incision). This will make a pocket deep under your skin that will hold the pulse generator.  Guide the leads through a large blood vessel into your heart and attach them to the heart muscles. Depending on the ICD, the leads may go into one ventricle or they may go to both ventricles and into an upper chamber of the heart (atrium).  Test the ICD.  Close the incision with stitches, glue, or staples. AFTER THE PROCEDURE  You may feel pain.  Some pain is normal. It may last a few days.  You may stay in a recovery area until the local anesthetic has worn off. Your blood pressure and pulse will be checked often. You will be taken to a room where your heart will be monitored.  A chest X-ray will be taken. This is done to check that the cardioverter defibrillator is in the right place.  You may stay in the hospital overnight.  A slight bump may be seen over the skin where the ICD was placed. Sometimes, it is possible to feel the ICD under the skin. This is normal.  In the months and years afterward, your caregiver will check the device, the leads, and the battery every few months. Eventually, when the battery is low, the ICD will be replaced.   This information is not intended to replace advice given to you by your health care provider. Make sure you discuss any questions you have with your health care provider.   Document Released: 05/06/2002 Document Revised: 06/04/2013 Document Reviewed: 09/02/2012 Elsevier Interactive Patient Education [employer].

## 2016-05-15 DIAGNOSIS — R972 Elevated prostate specific antigen [PSA]: Secondary | ICD-10-CM | POA: Diagnosis not present

## 2016-05-18 ENCOUNTER — Ambulatory Visit (HOSPITAL_COMMUNITY)
Admission: RE | Admit: 2016-05-18 | Discharge: 2016-05-19 | Disposition: A | Discharge status: Home / Self Care | Payer: Medicare Other | Source: Ambulatory Visit | Attending: Cardiology | Admitting: Cardiology

## 2016-05-18 ENCOUNTER — Encounter (HOSPITAL_COMMUNITY)
Admission: RE | Disposition: A | Discharge status: Home / Self Care | Payer: Self-pay | Source: Ambulatory Visit | Attending: Cardiology

## 2016-05-18 ENCOUNTER — Other Ambulatory Visit: Payer: Self-pay

## 2016-05-18 ENCOUNTER — Encounter (HOSPITAL_COMMUNITY): Payer: Self-pay | Admitting: Cardiology

## 2016-05-18 DIAGNOSIS — E669 Obesity, unspecified: Secondary | ICD-10-CM | POA: Diagnosis not present

## 2016-05-18 DIAGNOSIS — Z23 Encounter for immunization: Secondary | ICD-10-CM | POA: Insufficient documentation

## 2016-05-18 DIAGNOSIS — E119 Type 2 diabetes mellitus without complications: Secondary | ICD-10-CM | POA: Insufficient documentation

## 2016-05-18 DIAGNOSIS — I509 Heart failure, unspecified: Secondary | ICD-10-CM | POA: Diagnosis not present

## 2016-05-18 DIAGNOSIS — E785 Hyperlipidemia, unspecified: Secondary | ICD-10-CM | POA: Diagnosis not present

## 2016-05-18 DIAGNOSIS — I2510 Atherosclerotic heart disease of native coronary artery without angina pectoris: Secondary | ICD-10-CM | POA: Insufficient documentation

## 2016-05-18 DIAGNOSIS — Z6841 Body Mass Index (BMI) 40.0 and over, adult: Secondary | ICD-10-CM | POA: Insufficient documentation

## 2016-05-18 DIAGNOSIS — I252 Old myocardial infarction: Secondary | ICD-10-CM | POA: Diagnosis not present

## 2016-05-18 DIAGNOSIS — I255 Ischemic cardiomyopathy: Secondary | ICD-10-CM | POA: Diagnosis not present

## 2016-05-18 DIAGNOSIS — Z95810 Presence of automatic (implantable) cardiac defibrillator: Secondary | ICD-10-CM

## 2016-05-18 DIAGNOSIS — I110 Hypertensive heart disease with heart failure: Secondary | ICD-10-CM | POA: Insufficient documentation

## 2016-05-18 DIAGNOSIS — Z95818 Presence of other cardiac implants and grafts: Secondary | ICD-10-CM

## 2016-05-18 HISTORY — DX: Presence of automatic (implantable) cardiac defibrillator: Z95.810

## 2016-05-18 HISTORY — PX: EP IMPLANTABLE DEVICE: SHX172B

## 2016-05-18 LAB — SURGICAL PCR SCREEN
MRSA, PCR: NEGATIVE
STAPHYLOCOCCUS AUREUS: POSITIVE — AB

## 2016-05-18 SURGERY — ICD IMPLANT

## 2016-05-18 MED ORDER — INFLUENZA VAC SPLIT QUAD 0.5 ML IM SUSY
0.5000 mL | PREFILLED_SYRINGE | INTRAMUSCULAR | Status: AC
  Administered 2016-05-19: 0.5 mL via INTRAMUSCULAR
  Filled 2016-05-18: qty 0.5

## 2016-05-18 MED ORDER — FENTANYL CITRATE (PF) 100 MCG/2ML IJ SOLN
INTRAMUSCULAR | Status: AC
  Filled 2016-05-18: qty 2

## 2016-05-18 MED ORDER — DEXTROSE 5 % IV SOLN
3.0000 g | INTRAVENOUS | Status: AC
  Administered 2016-05-18: 3 g via INTRAVENOUS
  Filled 2016-05-18: qty 3000

## 2016-05-18 MED ORDER — ADULT MULTIVITAMIN W/MINERALS CH
ORAL_TABLET | Freq: Every day | ORAL | Status: DC
  Administered 2016-05-18 – 2016-05-19 (×2): 1 via ORAL
  Filled 2016-05-18 (×2): qty 1

## 2016-05-18 MED ORDER — NITROGLYCERIN 0.4 MG SL SUBL: 0.4000 mg | SUBLINGUAL_TABLET | SUBLINGUAL | Status: DC | PRN

## 2016-05-18 MED ORDER — HEPARIN (PORCINE) IN NACL 2-0.9 UNIT/ML-% IJ SOLN
INTRAMUSCULAR | Status: AC
  Filled 2016-05-18: qty 1000

## 2016-05-18 MED ORDER — HEPARIN (PORCINE) IN NACL 2-0.9 UNIT/ML-% IJ SOLN
INTRAMUSCULAR | Status: DC | PRN
  Administered 2016-05-18: 08:00:00

## 2016-05-18 MED ORDER — FUROSEMIDE 40 MG PO TABS: 40.0000 mg | ORAL_TABLET | Freq: Every day | ORAL | Status: DC | PRN

## 2016-05-18 MED ORDER — MUPIROCIN 2 % EX OINT
TOPICAL_OINTMENT | CUTANEOUS | Status: AC
  Administered 2016-05-18: 1 via TOPICAL
  Filled 2016-05-18: qty 22

## 2016-05-18 MED ORDER — ASPIRIN 325 MG PO TABS
325.0000 mg | ORAL_TABLET | Freq: Every day | ORAL | Status: DC
  Administered 2016-05-18 – 2016-05-19 (×2): 325 mg via ORAL
  Filled 2016-05-18 (×2): qty 1

## 2016-05-18 MED ORDER — FENTANYL CITRATE (PF) 100 MCG/2ML IJ SOLN
INTRAMUSCULAR | Status: DC | PRN
  Administered 2016-05-18 (×2): 25 ug via INTRAVENOUS

## 2016-05-18 MED ORDER — ACETAMINOPHEN 325 MG PO TABS
325.0000 mg | ORAL_TABLET | ORAL | Status: DC | PRN
  Administered 2016-05-18 (×2): 650 mg via ORAL
  Filled 2016-05-18: qty 2

## 2016-05-18 MED ORDER — MIDAZOLAM HCL 5 MG/5ML IJ SOLN
INTRAMUSCULAR | Status: DC | PRN
  Administered 2016-05-18 (×2): 1 mg via INTRAVENOUS
  Administered 2016-05-18: 2 mg via INTRAVENOUS

## 2016-05-18 MED ORDER — ROSUVASTATIN CALCIUM 10 MG PO TABS
40.0000 mg | ORAL_TABLET | Freq: Every day | ORAL | Status: DC
  Administered 2016-05-18 – 2016-05-19 (×2): 40 mg via ORAL
  Filled 2016-05-18 (×3): qty 4

## 2016-05-18 MED ORDER — MIDAZOLAM HCL 5 MG/5ML IJ SOLN
INTRAMUSCULAR | Status: AC
  Filled 2016-05-18: qty 5

## 2016-05-18 MED ORDER — LIDOCAINE HCL (PF) 1 % IJ SOLN
INTRAMUSCULAR | Status: DC | PRN
  Administered 2016-05-18: 60 mL

## 2016-05-18 MED ORDER — ONDANSETRON HCL 4 MG/2ML IJ SOLN: 4.0000 mg | Freq: Four times a day (QID) | INTRAMUSCULAR | Status: DC | PRN

## 2016-05-18 MED ORDER — CEFAZOLIN SODIUM-DEXTROSE 2-4 GM/100ML-% IV SOLN
2.0000 g | Freq: Four times a day (QID) | INTRAVENOUS | Status: AC
  Administered 2016-05-18 – 2016-05-19 (×3): 2 g via INTRAVENOUS
  Filled 2016-05-18 (×3): qty 100

## 2016-05-18 MED ORDER — SODIUM CHLORIDE 0.9 % IR SOLN
80.0000 mg | Status: AC
  Administered 2016-05-18: 80 mg

## 2016-05-18 MED ORDER — FUROSEMIDE 10 MG/ML IJ SOLN
INTRAMUSCULAR | Status: DC | PRN
  Administered 2016-05-18: 20 mg via INTRAVENOUS

## 2016-05-18 MED ORDER — CLOPIDOGREL BISULFATE 75 MG PO TABS
75.0000 mg | ORAL_TABLET | Freq: Every day | ORAL | Status: DC
  Administered 2016-05-18 – 2016-05-19 (×2): 75 mg via ORAL
  Filled 2016-05-18 (×2): qty 1

## 2016-05-18 MED ORDER — SODIUM CHLORIDE 0.9 % IV SOLN
INTRAVENOUS | Status: DC
  Administered 2016-05-18: 07:00:00 via INTRAVENOUS

## 2016-05-18 MED ORDER — LIDOCAINE HCL (PF) 1 % IJ SOLN
INTRAMUSCULAR | Status: AC
  Filled 2016-05-18: qty 30

## 2016-05-18 MED ORDER — FUROSEMIDE 10 MG/ML IJ SOLN
INTRAMUSCULAR | Status: AC
  Filled 2016-05-18: qty 4

## 2016-05-18 MED ORDER — METOPROLOL SUCCINATE ER 50 MG PO TB24
50.0000 mg | ORAL_TABLET | Freq: Every day | ORAL | Status: DC
  Administered 2016-05-18 – 2016-05-19 (×2): 50 mg via ORAL
  Filled 2016-05-18 (×2): qty 1

## 2016-05-18 MED ORDER — MUPIROCIN 2 % EX OINT
1.0000 "application " | TOPICAL_OINTMENT | Freq: Once | CUTANEOUS | Status: AC
  Administered 2016-05-18: 1 via TOPICAL

## 2016-05-18 MED ORDER — LISINOPRIL 5 MG PO TABS
5.0000 mg | ORAL_TABLET | Freq: Every day | ORAL | Status: DC
  Administered 2016-05-18: 5 mg via ORAL
  Filled 2016-05-18 (×2): qty 1

## 2016-05-18 MED ORDER — SODIUM CHLORIDE 0.9 % IR SOLN
Status: AC
  Filled 2016-05-18: qty 2

## 2016-05-18 MED ORDER — ACETAMINOPHEN 325 MG PO TABS
ORAL_TABLET | ORAL | Status: AC
  Filled 2016-05-18: qty 2

## 2016-05-18 SURGICAL SUPPLY — 7 items
CABLE SURGICAL S-101-97-12 (CABLE) ×3 IMPLANT
ICD VISIA MRI VR DVFB1D4 (ICD Generator) IMPLANT
LEAD SPRINT QUAT SEC 6935M-62 (Lead) ×3 IMPLANT
PAD DEFIB LIFELINK (PAD) ×3 IMPLANT
SHEATH CLASSIC 9F (SHEATH) ×3 IMPLANT
TRAY PACEMAKER INSERTION (PACKS) ×3 IMPLANT
VISIA MRI VR DVFB1D4 (ICD Generator) ×3 IMPLANT

## 2016-05-18 NOTE — Discharge Instructions (Signed)
° ° °  Supplemental Discharge Instructions for  Pacemaker/Defibrillator Patients  Activity No heavy lifting or vigorous activity with your left/right arm for 6 to 8 weeks.  Do not raise your left/right arm above your head for one week.  Gradually raise your affected arm as drawn below.           __         05/22/16                     05/23/16                     05/24/16                     05/25/16  NO DRIVING for   1 week  ; you may begin driving on   2/67/12  .  WOUND CARE - Keep the wound area clean and dry.  Do not get this area wet for one week. No showers for one week; you may shower on   05/25/16  . - The tape/steri-strips on your wound will fall off; do not pull them off.  No bandage is needed on the site.  DO  NOT apply any creams, oils, or ointments to the wound area. - If you notice any drainage or discharge from the wound, any swelling or bruising at the site, or you develop a fever > 101? F after you are discharged home, call the office at once.  Special Instructions - You are still able to use cellular telephones; use the ear opposite the side where you have your pacemaker/defibrillator.  Avoid carrying your cellular phone near your device. - When traveling through airports, show security personnel your identification card to avoid being screened in the metal detectors.  Ask the security personnel to use the hand wand. - Avoid arc welding equipment, TENS units (transcutaneous nerve stimulators).  Call the office for questions about other devices. - Avoid electrical appliances that are in poor condition or are not properly grounded. - Microwave ovens are safe to be near or to operate.  Additional information for defibrillator patients should your device go off: - If your device goes off ONCE and you feel fine afterward, notify the device clinic nurses. - If your device goes off ONCE and you do not feel well afterward, call 911. - If your device goes off TWICE, call 911. - If your  device goes off THREE times in one day, call 911.  DO NOT DRIVE YOURSELF OR A FAMILY MEMBER WITH A DEFIBRILLATOR TO THE HOSPITAL--CALL 911.

## 2016-05-18 NOTE — Discharge Summary (Signed)
ELECTROPHYSIOLOGY PROCEDURE DISCHARGE SUMMARY    Patient ID: [name] [name],  MRN: [id number], DOB/AGE: 1950/11/26 65 y.o.  Admit date: 05/18/2016 Discharge date: 05/19/2016  Primary Care Physician: [name] Lange, MD Primary Cardiologist: [name] [name] Electrophysiologist: [name] [name]  Primary Discharge Diagnosis:  Ischemic cardiomyopathy status post ICD implant this admission  Secondary Discharge Diagnosis:  1.  CAD 2.  Obesity 3.  Hypertension 4.  Hyperlipidemia 5.  Diabetes  Allergies  Allergen Reactions  . Dyflex-G [Dyphylline-Guaifenesin] Hives   Procedures This Admission:  1.  Implantation of a MDT single chamber ICD on 05/18/16 by Dr [name] [name].  See op note for full details.  There were no immediate post procedure complications. 2.  CXR on 05/19/16 demonstrated no pneumothorax status post device implantation.   Brief HPI: [name] [name] is a 65 y.o. male was referred to electrophysiology in the outpatient setting for consideration of ICD implantation.  Past medical history includes ischemic cardiomyopathy and chronic systolic heart failure.  The patient has persistent LV dysfunction despite guideline directed therapy.  Risks, benefits, and alternatives to ICD implantation were reviewed with the patient who wished to proceed.   [name] Course:  The patient was admitted and underwent implantation of a MDT single chamber ICD with details as outlined above. He was monitored on telemetry overnight which demonstrated sinus rhythm.  Left chest was without hematoma or ecchymosis.  The device was interrogated and found to be functioning normally.  CXR was obtained and demonstrated no pneumothorax status post device implantation.  Wound care, arm mobility, and restrictions were reviewed with the patient.  The patient was examined by Dr [name] [name] and considered stable for discharge to home.   The patient's discharge medications include an ACE-I (Lisinopril) and beta blocker (Toprol).    Physical Exam: Vitals:   05/18/16 1712 05/18/16 2046 05/18/16 2336 05/19/16 0452  BP: 110/67 110/72 113/75 108/75  Pulse: 77 78 80 74  Resp: 19 19 17 18   Temp: 98.1 F (36.7 C) 98.2 F (36.8 C) 98.2 F (36.8 C) 98 F (36.7 C)  TempSrc: Oral Oral Oral Oral  SpO2: 99% 98% 98% 98%  Weight:    289 lb (131.1 kg)  Height:       Labs:   Lab Results  Component Value Date   WBC 6.9 05/12/2016   HGB 15.0 05/12/2016   HCT 44.4 05/12/2016   MCV 94.9 05/12/2016   PLT 205 05/12/2016     Recent Labs Lab 05/12/16 1033  NA 140  K 5.0  CL 105  CO2 26  BUN 15  CREATININE 1.10  CALCIUM 9.0  GLUCOSE 122*    Discharge Medications:    Medication List    TAKE these medications   aspirin 325 MG tablet Take 325 mg by mouth daily.   CENTRUM SILVER PO Take 1 tablet by mouth daily.   clopidogrel 75 MG tablet Commonly known as:  PLAVIX TAKE ONE TABLET BY MOUTH DAILY. What changed:  See the new instructions.   furosemide 40 MG tablet Commonly known as:  LASIX TAKE 1 TABLET BY MOUTH DAILY AND TAKE 1 TABLET DAILY AS NEEDED FOR EDEMA What changed:  how much to take  how to take this  when to take this  reasons to take this  additional instructions   lisinopril 5 MG tablet Commonly known as:  PRINIVIL,ZESTRIL TAKE ONE TABLET BY MOUTH ONCE DAILY. What changed:  See the new instructions.   metoprolol succinate 50 MG 24 hr tablet Commonly known as:  TOPROL-XL Take 1 tablet (50 mg total) by mouth daily. Take with or immediately following a meal.   nitroGLYCERIN 0.4 MG SL tablet Commonly known as:  NITROSTAT Place 0.4 mg under the tongue every 5 (five) minutes as needed for chest pain (x 3 doses).   rosuvastatin 40 MG tablet Commonly known as:  CRESTOR TAKE ONE TABLET BY MOUTH DAILY. What changed:  See the new instructions.   trolamine salicylate 10 % cream Commonly known as:  ASPERCREME Apply 1 application topically as needed for muscle pain.        Disposition:  Discharge Instructions    Diet - low sodium heart healthy    Complete by:  As directed    Increase activity slowly    Complete by:  As directed      Follow-up Information    [facility] Follow up on 06/01/2016.   Specialty:  Cardiology Why:  at 4:30PM for wound check  Contact information: [address] [place] [phone]       [name] Vanderlinden Meredith Leeds, MD Follow up on 08/25/2016.   Specialty:  Cardiology Why:  at 11:45AM Contact information: [name] [address] [phone]           Duration of Discharge Encounter: Greater than 30 minutes including physician time.  Signed, [name] Marshall, NP 05/19/2016 8:00 AM  I have seen and examined this patient with [name] [name].  Agree with above, note added to reflect my findings.  On exam, RRR, no murmurs, lungs clear.  ICD placed for ischemic cardiomyopathy.  Tolerated procedure without issues.  Plan for discharge today with follow up in device clinic.    [name] Purdy M. Dalin Caldera MD 05/19/2016 8:29 AM

## 2016-05-18 NOTE — H&P (Signed)
[name] [name] has a history of ischemic cardiomyopathy.  He has been on optimal medical therapy for >3 months. His EF is persistently low.  He presents today for ICD placement.  Regular rhythm, no murmurs, lungs clear. Risks and benefits of the procedure were discussed.  Risks include but are not limited to bleeding, infection, tamponade, pneumothorax.  The patient understands these risks and has agreed to the procedure.  ICD Criteria  Current LVEF:15%. Within 12 months prior to implant: Yes   Heart failure history: Yes, Class II  Cardiomyopathy history: Yes, Ischemic Cardiomyopathy - Prior MI.  Atrial Fibrillation/Atrial Flutter: No.  Ventricular tachycardia history: No.  Cardiac arrest history: No.  History of syndromes with risk of sudden death: No.  Previous ICD: No.  Current ICD indication: Primary  PPM indication: No.   Class I or II Bradycardia indication present: No  Beta Blocker therapy for 3 or more months: Yes, prescribed.   Ace Inhibitor/ARB therapy for 3 or more months: Yes, prescribed.    [name], MD 05/18/2016 6:59 AM

## 2016-05-19 ENCOUNTER — Ambulatory Visit (HOSPITAL_COMMUNITY): Payer: Medicare Other

## 2016-05-19 DIAGNOSIS — Z6841 Body Mass Index (BMI) 40.0 and over, adult: Secondary | ICD-10-CM | POA: Diagnosis not present

## 2016-05-19 DIAGNOSIS — E785 Hyperlipidemia, unspecified: Secondary | ICD-10-CM | POA: Diagnosis not present

## 2016-05-19 DIAGNOSIS — E119 Type 2 diabetes mellitus without complications: Secondary | ICD-10-CM | POA: Diagnosis not present

## 2016-05-19 DIAGNOSIS — I110 Hypertensive heart disease with heart failure: Secondary | ICD-10-CM | POA: Diagnosis not present

## 2016-05-19 DIAGNOSIS — Z950 Presence of cardiac pacemaker: Secondary | ICD-10-CM | POA: Diagnosis not present

## 2016-05-19 DIAGNOSIS — E669 Obesity, unspecified: Secondary | ICD-10-CM | POA: Diagnosis not present

## 2016-05-19 DIAGNOSIS — I255 Ischemic cardiomyopathy: Secondary | ICD-10-CM | POA: Diagnosis not present

## 2016-05-19 DIAGNOSIS — I2510 Atherosclerotic heart disease of native coronary artery without angina pectoris: Secondary | ICD-10-CM | POA: Diagnosis not present

## 2016-05-19 DIAGNOSIS — I252 Old myocardial infarction: Secondary | ICD-10-CM | POA: Diagnosis not present

## 2016-05-19 DIAGNOSIS — Z23 Encounter for immunization: Secondary | ICD-10-CM | POA: Diagnosis not present

## 2016-05-19 DIAGNOSIS — I509 Heart failure, unspecified: Secondary | ICD-10-CM | POA: Diagnosis not present

## 2016-05-19 MED ORDER — DIPHENHYDRAMINE HCL 25 MG PO CAPS
25.0000 mg | ORAL_CAPSULE | Freq: Once | ORAL | Status: AC
  Administered 2016-05-19: 25 mg via ORAL
  Filled 2016-05-19: qty 1

## 2016-05-19 NOTE — Progress Notes (Signed)
Discharge instructions reviewed with patient. Patient's dressing on chest is clean dry and intact. Pt has no questions at this time. Pt  Has received is Medtr[employer] kit. Pt discharged home with wife.

## 2016-05-22 MED FILL — Cefazolin Sodium-Dextrose IV Solution 2 GM/100ML-4%: INTRAVENOUS | Qty: 100 | Status: AC

## 2016-05-26 ENCOUNTER — Ambulatory Visit (INDEPENDENT_AMBULATORY_CARE_PROVIDER_SITE_OTHER): Payer: Medicare Other | Admitting: Urology

## 2016-05-26 DIAGNOSIS — R972 Elevated prostate specific antigen [PSA]: Secondary | ICD-10-CM

## 2016-05-26 DIAGNOSIS — R351 Nocturia: Secondary | ICD-10-CM | POA: Diagnosis not present

## 2016-06-01 ENCOUNTER — Ambulatory Visit (INDEPENDENT_AMBULATORY_CARE_PROVIDER_SITE_OTHER): Payer: Medicare Other | Admitting: *Deleted

## 2016-06-01 DIAGNOSIS — I255 Ischemic cardiomyopathy: Secondary | ICD-10-CM | POA: Diagnosis not present

## 2016-06-01 DIAGNOSIS — I5022 Chronic systolic (congestive) heart failure: Secondary | ICD-10-CM

## 2016-06-02 LAB — CUP PACEART INCLINIC DEVICE CHECK
Brady Statistic RV Percent Paced: 0 %
HIGH POWER IMPEDANCE MEASURED VALUE: 70 Ohm
Lead Channel Impedance Value: 456 Ohm
Lead Channel Impedance Value: 513 Ohm
Lead Channel Pacing Threshold Amplitude: 0.5 V
Lead Channel Setting Sensing Sensitivity: 0.3 mV
MDC IDC LEAD IMPLANT DT: 20170921
MDC IDC LEAD LOCATION: 753860
MDC IDC MSMT BATTERY REMAINING LONGEVITY: 138 mo
MDC IDC MSMT BATTERY VOLTAGE: 3.16 V
MDC IDC MSMT LEADCHNL RV PACING THRESHOLD PULSEWIDTH: 0.4 ms
MDC IDC MSMT LEADCHNL RV SENSING INTR AMPL: 10.5 mV
MDC IDC MSMT LEADCHNL RV SENSING INTR AMPL: 6.125 mV
MDC IDC SESS DTM: 20171005203331
MDC IDC SET LEADCHNL RV PACING AMPLITUDE: 3.5 V
MDC IDC SET LEADCHNL RV PACING PULSEWIDTH: 0.4 ms

## 2016-06-02 NOTE — Progress Notes (Signed)
Wound check appointment. Steri-strips removed prior to appt. Wound without redness or edema. Incision edges approximated, wound well healed. Normal device function. Threshold, sensing, and impedances consistent with implant measurements. Device programmed at 3.5V for extra safety margin until 3 month visit. Histogram distribution appropriate for patient and level of activity. No ventricular arrhythmias noted. Patient educated about wound care, arm mobility, lifting restrictions, shock plan. ROV in 3 months with WC.

## 2016-06-13 ENCOUNTER — Other Ambulatory Visit: Payer: Self-pay | Admitting: Family Medicine

## 2016-06-21 ENCOUNTER — Other Ambulatory Visit: Payer: Self-pay | Admitting: Family Medicine

## 2016-07-11 ENCOUNTER — Encounter: Payer: Self-pay | Admitting: Family Medicine

## 2016-07-11 ENCOUNTER — Ambulatory Visit (INDEPENDENT_AMBULATORY_CARE_PROVIDER_SITE_OTHER): Payer: Medicare Other | Admitting: Family Medicine

## 2016-07-11 ENCOUNTER — Telehealth: Payer: Self-pay | Admitting: Family Medicine

## 2016-07-11 VITALS — BP 130/80 | Ht 70.5 in | Wt 301.4 lb

## 2016-07-11 DIAGNOSIS — E784 Other hyperlipidemia: Secondary | ICD-10-CM

## 2016-07-11 DIAGNOSIS — M898X2 Other specified disorders of bone, upper arm: Secondary | ICD-10-CM

## 2016-07-11 DIAGNOSIS — Z23 Encounter for immunization: Secondary | ICD-10-CM

## 2016-07-11 DIAGNOSIS — R7303 Prediabetes: Secondary | ICD-10-CM

## 2016-07-11 DIAGNOSIS — M79621 Pain in right upper arm: Secondary | ICD-10-CM

## 2016-07-11 DIAGNOSIS — E7849 Other hyperlipidemia: Secondary | ICD-10-CM

## 2016-07-11 DIAGNOSIS — I10 Essential (primary) hypertension: Secondary | ICD-10-CM

## 2016-07-11 MED ORDER — HYDROCODONE-ACETAMINOPHEN 10-325 MG PO TABS: 1.0000 | ORAL_TABLET | Freq: Three times a day (TID) | ORAL | 0 refills | Status: DC | PRN

## 2016-07-11 MED ORDER — ROSUVASTATIN CALCIUM 40 MG PO TABS: 40.0000 mg | ORAL_TABLET | Freq: Every day | ORAL | 2 refills | Status: DC

## 2016-07-11 MED ORDER — CLOPIDOGREL BISULFATE 75 MG PO TABS: 75.0000 mg | ORAL_TABLET | Freq: Every day | ORAL | 1 refills | Status: DC

## 2016-07-11 MED ORDER — FUROSEMIDE 40 MG PO TABS: 40.0000 mg | ORAL_TABLET | Freq: Every day | ORAL | 2 refills | Status: DC | PRN

## 2016-07-11 MED ORDER — LISINOPRIL 5 MG PO TABS: 5.0000 mg | ORAL_TABLET | Freq: Every day | ORAL | 2 refills | Status: DC

## 2016-07-11 NOTE — Telephone Encounter (Signed)
Patient was seen this morning and wanted you to know the name of the book he was talking about at his visit [employer] [facility]

## 2016-07-11 NOTE — Progress Notes (Signed)
   Subjective:    Patient ID: [name] [name], male    DOB: October 12, 1950, 65 y.o.   MRN: [id number]  Hyperlipidemia  This is a chronic problem. The current episode started more than 1 year ago. There are no known factors aggravating his hyperlipidemia. Pertinent negatives include no chest pain. Current antihyperlipidemic treatment includes statins. The current treatment provides moderate improvement of lipids. There are no compliance problems.  There are no known risk factors for coronary artery disease.   Patient states that he changed his diet recently and he experienced a fall. He needs a refill on his hydrocodone because he has some discomfort in his right side due to this.  Patient does not think he broke anything just relates significant pain discomfort. 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 It does not take his cholesterol medicine on a regular basis. Takes his lisinopril most days. Does not always take his metoprolol and does state he takes his Plavix on a daily basis denies any coronary symptoms currently  Review of Systems  Constitutional: Negative for activity change, appetite change and fatigue.  HENT: Negative for congestion.   Respiratory: Negative for cough.   Cardiovascular: Negative for chest pain.  Gastrointestinal: Negative for abdominal pain.  Endocrine: Negative for polydipsia and polyphagia.  Neurological: Negative for weakness.  Psychiatric/Behavioral: Negative for confusion.       Objective:   Physical Exam  Constitutional: He appears well-nourished. No distress.  Cardiovascular: Normal rate and normal heart sounds.   No murmur heard. Pulmonary/Chest: Effort normal and breath sounds normal. No respiratory distress.  Musculoskeletal: He exhibits no edema.  Lymphadenopathy:    He has no cervical adenopathy.  Neurological: He is alert.    Psychiatric: His behavior is normal.  Vitals reviewed.         Assessment & Plan:  History elevated PSA follow-up with urology on a regular basis Heart disease stable Encourage patient to take Plavix daily Lasix and lisinopril daily Patient currently not taking metoprolol often I encouraged him to talk this with his cardiologist when he follows up in December Cholesterol medicine recommended daily but patient is not doing so importance of doing so was discussed importance of watching diet discussed previous labs reviewed with patient Labs ordered 25 minutes spent with patient Mild joint discomforts may use hydrocodone when necessary cautioned drowsiness patient cannot take anti-inflammatories Pneumonia booster today Follow-up in approximately 4-6 months depending on how he is doing

## 2016-07-12 LAB — HEPATIC FUNCTION PANEL
ALBUMIN: 4.5 g/dL (ref 3.6–4.8)
ALK PHOS: 117 IU/L (ref 39–117)
ALT: 20 IU/L (ref 0–44)
AST: 17 IU/L (ref 0–40)
BILIRUBIN TOTAL: 0.7 mg/dL (ref 0.0–1.2)
Bilirubin, Direct: 0.16 mg/dL (ref 0.00–0.40)
Total Protein: 7.5 g/dL (ref 6.0–8.5)

## 2016-07-12 LAB — BASIC METABOLIC PANEL
BUN / CREAT RATIO: 13 (ref 10–24)
BUN: 13 mg/dL (ref 8–27)
CHLORIDE: 98 mmol/L (ref 96–106)
CO2: 24 mmol/L (ref 18–29)
Calcium: 9.9 mg/dL (ref 8.6–10.2)
Creatinine, Ser: 0.97 mg/dL (ref 0.76–1.27)
GFR, EST AFRICAN AMERICAN: 94 mL/min/{1.73_m2} (ref >59)
GFR, EST NON AFRICAN AMERICAN: 82 mL/min/{1.73_m2} (ref >59)
Glucose: 121 mg/dL — ABNORMAL HIGH (ref 65–99)
POTASSIUM: 4.7 mmol/L (ref 3.5–5.2)
SODIUM: 139 mmol/L (ref 134–144)

## 2016-07-12 LAB — HEMOGLOBIN A1C
Est. average glucose Bld gHb Est-mCnc: 137 mg/dL
Hgb A1c MFr Bld: 6.4 % — ABNORMAL HIGH (ref 4.8–5.6)

## 2016-07-12 LAB — LIPID PANEL
CHOLESTEROL TOTAL: 151 mg/dL (ref 100–199)
Chol/HDL Ratio: 4.2 ratio units (ref 0.0–5.0)
HDL: 36 mg/dL — ABNORMAL LOW (ref >39)
LDL Calculated: 83 mg/dL (ref 0–99)
TRIGLYCERIDES: 162 mg/dL — AB (ref 0–149)
VLDL Cholesterol Cal: 32 mg/dL (ref 5–40)

## 2016-07-24 DIAGNOSIS — Z961 Presence of intraocular lens: Secondary | ICD-10-CM | POA: Diagnosis not present

## 2016-08-10 ENCOUNTER — Encounter: Payer: Self-pay | Admitting: Cardiology

## 2016-08-14 DIAGNOSIS — H04123 Dry eye syndrome of bilateral lacrimal glands: Secondary | ICD-10-CM | POA: Diagnosis not present

## 2016-08-23 ENCOUNTER — Other Ambulatory Visit: Payer: Self-pay | Admitting: Family Medicine

## 2016-08-25 ENCOUNTER — Ambulatory Visit (INDEPENDENT_AMBULATORY_CARE_PROVIDER_SITE_OTHER): Payer: Medicare Other | Admitting: Cardiology

## 2016-08-25 ENCOUNTER — Encounter: Payer: Self-pay | Admitting: Cardiology

## 2016-08-25 VITALS — BP 136/84 | HR 97 | Ht 71.0 in | Wt 302.6 lb

## 2016-08-25 DIAGNOSIS — I255 Ischemic cardiomyopathy: Secondary | ICD-10-CM | POA: Diagnosis not present

## 2016-08-25 DIAGNOSIS — Z95810 Presence of automatic (implantable) cardiac defibrillator: Secondary | ICD-10-CM

## 2016-08-25 DIAGNOSIS — I2589 Other forms of chronic ischemic heart disease: Secondary | ICD-10-CM

## 2016-08-25 DIAGNOSIS — I5022 Chronic systolic (congestive) heart failure: Secondary | ICD-10-CM | POA: Diagnosis not present

## 2016-08-25 LAB — CUP PACEART INCLINIC DEVICE CHECK
Battery Voltage: 3.14 V
Brady Statistic RV Percent Paced: 0 %
HighPow Impedance: 75 Ohm
Lead Channel Impedance Value: 380 Ohm
Lead Channel Sensing Intrinsic Amplitude: 4.6 mV
Lead Channel Setting Pacing Amplitude: 2.5 V
Lead Channel Setting Pacing Pulse Width: 0.4 ms
MDC IDC LEAD IMPLANT DT: 20170921
MDC IDC LEAD LOCATION: 753860
MDC IDC MSMT BATTERY REMAINING LONGEVITY: 137 mo
MDC IDC MSMT LEADCHNL RV IMPEDANCE VALUE: 456 Ohm
MDC IDC MSMT LEADCHNL RV PACING THRESHOLD AMPLITUDE: 0.5 V
MDC IDC MSMT LEADCHNL RV PACING THRESHOLD PULSEWIDTH: 0.4 ms
MDC IDC PG IMPLANT DT: 20170921
MDC IDC SESS DTM: 20171229144155
MDC IDC SET LEADCHNL RV SENSING SENSITIVITY: 0.3 mV

## 2016-08-25 NOTE — Progress Notes (Signed)
he   Electrophysiology Office Note   Date:  08/25/2016   ID:  [name] [name], DOB [birth year], MRN [id number]  PCP:  Sallee Lange, MD  Cardiologist:  Johnsie Cancel Primary Electrophysiologist:  Will Meredith Leeds, MD    Chief Complaint  Patient presents with  . DFIB CHECK    CSCHF     History of Present Illness: [name] [name] is a 65 y.o. male who presents today for electrophysiology evaluation.   He has a history of morbid obesity, hypertension, hyperlipidemia, type 2 diabetes, ischemic cardiomyopathy with an EF of 15%, coronary disease status post CABG 6 in 2001 and subsequent PCI. He was admitted to [facility] 2/17 with CHF. He was treated with IV diuretics. He returns to clinic today feeling well without major complaint. His medications were adjusted and he had an echocardiogram 3 months after the adjustment that showed that his EF still remains low.   Today, he denies symptoms of palpitations, chest pain, shortness of breath, orthopnea, PND, lower extremity edema, claudication, dizziness, presyncope, syncope, bleeding, or neurologic sequela. The patient is tolerating medications without difficulties and is otherwise without complaint today. He was having some shortness of breath, as he was having dietary indiscretions around the time of his wife's aortic valve replacement. He is taking increasing doses of Lasix which is improved shortness of breath. He also says that he was potentially diagnosed with sleep apnea but there was some concern over his CPAP settings and is not wearing a CPAP. I have encouraged him to further discuss this with his primary physician.   Past Medical History:  Diagnosis Date  . AICD (automatic cardioverter/defibrillator) present 05/18/2016  . CAD (coronary artery disease)   . Cardiomyopathy, ischemic   . CHF (congestive heart failure) ([place])   . Chronic back pain   . Diabetes mellitus without complication (HCC)    prediabetes  . Elevated PSA   .  Hyperlipidemia   . Morbid obesity ([place])   . OSA (obstructive sleep apnea)    Past Surgical History:  Procedure Laterality Date  . APPENDECTOMY    . CAROTID STENT    . CATARACT EXTRACTION, BILATERAL    . CORONARY ARTERY BYPASS GRAFT    . EP IMPLANTABLE DEVICE N/A 05/18/2016   Procedure: ICD Implant;  Surgeon: Will Meredith Leeds, MD;  Location: [facility];  Service: Cardiovascular;  Laterality: N/A;  . ingrown toenail N/A   . LEFT HEART CATHETERIZATION WITH CORONARY/GRAFT ANGIOGRAM N/A 12/21/2014   Procedure: LEFT HEART CATHETERIZATION WITH Beatrix Fetters;  Surgeon: Belva Crome, MD;  Location: [facility] CATH LAB;  Service: Cardiovascular;  Laterality: N/A;     Current Outpatient Prescriptions  Medication Sig Dispense Refill  . aspirin 325 MG tablet Take 325 mg by mouth daily.    . clopidogrel (PLAVIX) 75 MG tablet Take 1 tablet (75 mg total) by mouth daily. 90 tablet 1  . furosemide (LASIX) 40 MG tablet Take 1 tablet (40 mg total) by mouth daily as needed for edema. 90 tablet 2  . HYDROcodone-acetaminophen (NORCO) 10-325 MG tablet Take 1 tablet by mouth every 8 (eight) hours as needed. 30 tablet 0  . lisinopril (PRINIVIL,ZESTRIL) 5 MG tablet Take 1 tablet (5 mg total) by mouth daily. 90 tablet 2  . metoprolol succinate (TOPROL-XL) 50 MG 24 hr tablet Take 1 tablet (50 mg total) by mouth daily. Take with or immediately following a meal. 90 tablet 3  . Multiple Vitamins-Minerals (CENTRUM SILVER PO) Take 1 tablet by  mouth daily.     . nitroGLYCERIN (NITROSTAT) 0.4 MG SL tablet PLACE 1 TABLET UNDER TONGUE FOR CHEST PAIN. MAY REPEAT EVERY 5 MIN UPTO 3 DOSES-NO RELIEF,CALL 911. 25 tablet 0  . rosuvastatin (CRESTOR) 40 MG tablet Take 1 tablet (40 mg total) by mouth daily. 90 tablet 2  . trolamine salicylate (ASPERCREME) 10 % cream Apply 1 application topically as needed for muscle pain.     No current facility-administered medications for this visit.     Allergies:   Dyflex-g  [dyphylline-guaifenesin]   Social History:  The patient  reports that he has quit smoking. He has never used smokeless tobacco. He reports that he does not drink alcohol or use drugs.   Family History:  The patient's He was adopted. Family history is unknown by patient.    ROS:  Please see the history of present illness.   Otherwise, review of systems is positive for weight gain, fatigue, chest pain, SOB when lying flat, waking up SOB.   All other systems are reviewed and negative.    PHYSICAL EXAM: VS:  BP 136/84   Pulse 97   Ht 5\' 11"  (1.803 m)   Wt (!) 302 lb 9.6 oz (137.3 kg)   BMI 42.20 kg/m  , BMI Body mass index is 42.2 kg/m. GEN: Well nourished, well developed, in no acute distress  HEENT: normal  Neck: no JVD, carotid bruits, or masses Cardiac: RRR; no murmurs, rubs, or gallops,no edema  Respiratory:  clear to auscultation bilaterally, normal work of breathing GI: soft, nontender, nondistended, + BS MS: no deformity or atrophy  Skin: warm and dry Neuro:  Strength and sensation are intact Psych: euthymic mood, full affect  EKG:  EKG is ordered today. Personal review of the ekg ordered shows sinus rhythm, LAFB, PRWP, prolonged QTc  Recent Labs: 10/22/2015: B Natriuretic Peptide 547.7 05/12/2016: Hemoglobin 15.0; Platelets 205 07/11/2016: ALT 20; BUN 13; Creatinine, Ser 0.97; Potassium 4.7; Sodium 139    Lipid Panel     Component Value Date/Time   CHOL 151 07/11/2016 1043   TRIG 162 (H) 07/11/2016 1043   HDL 36 (L) 07/11/2016 1043   CHOLHDL 4.2 07/11/2016 1043   CHOLHDL 3.9 01/02/2015 1010   VLDL 31 01/02/2015 1010   LDLCALC 83 07/11/2016 1043     Wt Readings from Last 3 Encounters:  08/25/16 (!) 302 lb 9.6 oz (137.3 kg)  07/11/16 (!) 301 lb 6 oz (136.7 kg)  05/19/16 289 lb (131.1 kg)      Other studies Reviewed: Additional studies/ records that were reviewed today include: TTE 05/08/16  - Left ventricle: Extremely poor acoustic windows limit study  Even   with contrast use. Overall LVEF is probably moderately to   severely depressed. The cavity size was severely dilated. Wall   thickness was normal. Doppler parameters are consistent with a   reversible restrictive pattern, indicative of decreased left   ventricular diastolic compliance and/or increased left atrial   pressure (grade 3 diastolic dysfunction). - Aortic valve: AV is thckened, calcified . Leaflets are diffcult   to see Peak and mean graidents through the valve are 31 and 22 mm   Hg respectivel y Consider TEE to furgher evaluate. - Mitral valve: There was mild regurgitation. - Left atrium: The atrium was severely dilated. - Pulmonary arteries: PA peak pressure: 36 mm Hg (S).  ASSESSMENT AND PLAN:  1.  Ischemic cardiomyopathy: EF is 15%. ICD placed 05/18/16. Device is functioning properly without any issues. Long-term settings  been done. He was having some lower extremity edema, but he took extra doses of Lasix which helped to improve his edema and he is doing much better now.  2. CAD: Currently feeling well with no chest pain  3. Hypertension: Blood pressure currently is low in the 58I systolic. We'll continue his current medications as he is asymptomatic.   Current medicines are reviewed at length with the patient today.   The patient does not have concerns regarding his medicines.  The following changes were made today:  Change lopressor to Toprol XL  Labs/ tests ordered today include:  Orders Placed This Encounter  Procedures  . EKG 12-Lead     Disposition:   FU with Will Camnitz 9 months  Signed, Will Meredith Leeds, MD  08/25/2016 12:24 PM     [facility] [address] [place] [address] [phone] (office) [phone] (fax)

## 2016-08-25 NOTE — Patient Instructions (Addendum)
Medication Instructions:    Your physician recommends that you continue on your current medications as directed. Please refer to the Current Medication list given to you today.  --- If you need a refill on your cardiac medications before your next appointment, please call your pharmacy. ---  Labwork:  None ordered  Testing/Procedures:  None ordered  Follow-Up: Remote monitoring is used to monitor your Pacemaker of ICD from home. This monitoring reduces the number of office visits required to check your device to one time per year. It allows [place] to keep an eye on the functioning of your device to ensure it is working properly. You are scheduled for a device check from home on 11/27/2016. You may send your transmission at any time that day. If you have a wireless device, the transmission will be sent automatically. After your physician reviews your transmission, you will receive a postcard with your next transmission date.   Your physician wants you to follow-up in: 9 months with Dr. Curt Bears.  You will receive a reminder letter in the mail two months in advance. If you don't receive a letter, please call our office to schedule the follow-up appointment.   Any Other Special Instructions Will Be Listed Below (If Applicable). Please keep track of your pulse rates over the next few weeks.  Sherri, RN will call you in a few weeks to review.  Thank you for choosing [facility]!!   Trinidad Curet, RN [phone]

## 2016-09-11 ENCOUNTER — Ambulatory Visit (INDEPENDENT_AMBULATORY_CARE_PROVIDER_SITE_OTHER): Payer: Medicare Other | Admitting: Nurse Practitioner

## 2016-09-11 ENCOUNTER — Inpatient Hospital Stay (HOSPITAL_COMMUNITY)
Admission: EM | Admit: 2016-09-11 | Discharge: 2016-09-18 | DRG: 291 | Disposition: A | Discharge status: Home / Self Care | Payer: Medicare Other | Attending: Nephrology | Admitting: Nephrology

## 2016-09-11 ENCOUNTER — Emergency Department (HOSPITAL_COMMUNITY): Payer: Medicare Other

## 2016-09-11 ENCOUNTER — Encounter (HOSPITAL_COMMUNITY): Payer: Self-pay | Admitting: Cardiology

## 2016-09-11 ENCOUNTER — Encounter: Payer: Self-pay | Admitting: Nurse Practitioner

## 2016-09-11 VITALS — BP 134/80 | Temp 97.5°F | Ht 70.5 in | Wt 317.0 lb

## 2016-09-11 DIAGNOSIS — I5033 Acute on chronic diastolic (congestive) heart failure: Secondary | ICD-10-CM | POA: Diagnosis not present

## 2016-09-11 DIAGNOSIS — R079 Chest pain, unspecified: Secondary | ICD-10-CM | POA: Diagnosis not present

## 2016-09-11 DIAGNOSIS — E1121 Type 2 diabetes mellitus with diabetic nephropathy: Secondary | ICD-10-CM

## 2016-09-11 DIAGNOSIS — Z66 Do not resuscitate: Secondary | ICD-10-CM | POA: Diagnosis present

## 2016-09-11 DIAGNOSIS — Z79899 Other long term (current) drug therapy: Secondary | ICD-10-CM

## 2016-09-11 DIAGNOSIS — K5900 Constipation, unspecified: Secondary | ICD-10-CM

## 2016-09-11 DIAGNOSIS — Z6839 Body mass index (BMI) 39.0-39.9, adult: Secondary | ICD-10-CM

## 2016-09-11 DIAGNOSIS — I509 Heart failure, unspecified: Secondary | ICD-10-CM

## 2016-09-11 DIAGNOSIS — E785 Hyperlipidemia, unspecified: Secondary | ICD-10-CM | POA: Diagnosis present

## 2016-09-11 DIAGNOSIS — Z87891 Personal history of nicotine dependence: Secondary | ICD-10-CM | POA: Diagnosis not present

## 2016-09-11 DIAGNOSIS — Z9841 Cataract extraction status, right eye: Secondary | ICD-10-CM

## 2016-09-11 DIAGNOSIS — Z6841 Body Mass Index (BMI) 40.0 and over, adult: Secondary | ICD-10-CM

## 2016-09-11 DIAGNOSIS — R0600 Dyspnea, unspecified: Secondary | ICD-10-CM | POA: Diagnosis not present

## 2016-09-11 DIAGNOSIS — E119 Type 2 diabetes mellitus without complications: Secondary | ICD-10-CM

## 2016-09-11 DIAGNOSIS — N183 Chronic kidney disease, stage 3 unspecified: Secondary | ICD-10-CM

## 2016-09-11 DIAGNOSIS — I130 Hypertensive heart and chronic kidney disease with heart failure and stage 1 through stage 4 chronic kidney disease, or unspecified chronic kidney disease: Principal | ICD-10-CM | POA: Diagnosis present

## 2016-09-11 DIAGNOSIS — I5023 Acute on chronic systolic (congestive) heart failure: Secondary | ICD-10-CM | POA: Diagnosis not present

## 2016-09-11 DIAGNOSIS — Z95810 Presence of automatic (implantable) cardiac defibrillator: Secondary | ICD-10-CM

## 2016-09-11 DIAGNOSIS — Z951 Presence of aortocoronary bypass graft: Secondary | ICD-10-CM

## 2016-09-11 DIAGNOSIS — Z9119 Patient's noncompliance with other medical treatment and regimen: Secondary | ICD-10-CM

## 2016-09-11 DIAGNOSIS — R0601 Orthopnea: Secondary | ICD-10-CM | POA: Diagnosis not present

## 2016-09-11 DIAGNOSIS — E784 Other hyperlipidemia: Secondary | ICD-10-CM | POA: Diagnosis not present

## 2016-09-11 DIAGNOSIS — E1122 Type 2 diabetes mellitus with diabetic chronic kidney disease: Secondary | ICD-10-CM | POA: Diagnosis present

## 2016-09-11 DIAGNOSIS — R9431 Abnormal electrocardiogram [ECG] [EKG]: Secondary | ICD-10-CM

## 2016-09-11 DIAGNOSIS — Z955 Presence of coronary angioplasty implant and graft: Secondary | ICD-10-CM

## 2016-09-11 DIAGNOSIS — G4733 Obstructive sleep apnea (adult) (pediatric): Secondary | ICD-10-CM | POA: Diagnosis present

## 2016-09-11 DIAGNOSIS — Z9842 Cataract extraction status, left eye: Secondary | ICD-10-CM

## 2016-09-11 DIAGNOSIS — K5909 Other constipation: Secondary | ICD-10-CM | POA: Diagnosis not present

## 2016-09-11 DIAGNOSIS — Z7902 Long term (current) use of antithrombotics/antiplatelets: Secondary | ICD-10-CM

## 2016-09-11 DIAGNOSIS — R601 Generalized edema: Secondary | ICD-10-CM | POA: Diagnosis not present

## 2016-09-11 DIAGNOSIS — E876 Hypokalemia: Secondary | ICD-10-CM | POA: Diagnosis not present

## 2016-09-11 DIAGNOSIS — R0609 Other forms of dyspnea: Secondary | ICD-10-CM

## 2016-09-11 DIAGNOSIS — I10 Essential (primary) hypertension: Secondary | ICD-10-CM | POA: Diagnosis present

## 2016-09-11 DIAGNOSIS — E782 Mixed hyperlipidemia: Secondary | ICD-10-CM | POA: Diagnosis not present

## 2016-09-11 DIAGNOSIS — I255 Ischemic cardiomyopathy: Secondary | ICD-10-CM | POA: Diagnosis present

## 2016-09-11 DIAGNOSIS — R262 Difficulty in walking, not elsewhere classified: Secondary | ICD-10-CM

## 2016-09-11 DIAGNOSIS — I2510 Atherosclerotic heart disease of native coronary artery without angina pectoris: Secondary | ICD-10-CM | POA: Diagnosis not present

## 2016-09-11 DIAGNOSIS — K5903 Drug induced constipation: Secondary | ICD-10-CM | POA: Diagnosis not present

## 2016-09-11 DIAGNOSIS — R109 Unspecified abdominal pain: Secondary | ICD-10-CM

## 2016-09-11 DIAGNOSIS — I5043 Acute on chronic combined systolic (congestive) and diastolic (congestive) heart failure: Secondary | ICD-10-CM | POA: Diagnosis not present

## 2016-09-11 DIAGNOSIS — R1030 Lower abdominal pain, unspecified: Secondary | ICD-10-CM | POA: Diagnosis not present

## 2016-09-11 DIAGNOSIS — N179 Acute kidney failure, unspecified: Secondary | ICD-10-CM | POA: Diagnosis present

## 2016-09-11 DIAGNOSIS — I350 Nonrheumatic aortic (valve) stenosis: Secondary | ICD-10-CM | POA: Diagnosis not present

## 2016-09-11 DIAGNOSIS — R0602 Shortness of breath: Secondary | ICD-10-CM | POA: Diagnosis not present

## 2016-09-11 LAB — TSH: TSH: 3.363 u[IU]/mL (ref 0.350–4.500)

## 2016-09-11 LAB — CBC WITH DIFFERENTIAL/PLATELET
Basophils Absolute: 0 10*3/uL (ref 0.0–0.1)
Basophils Relative: 0 %
Eosinophils Absolute: 0.1 10*3/uL (ref 0.0–0.7)
Eosinophils Relative: 1 %
HCT: 47.5 % (ref 39.0–52.0)
Hemoglobin: 15.5 g/dL (ref 13.0–17.0)
Lymphocytes Relative: 11 %
Lymphs Abs: 1.3 10*3/uL (ref 0.7–4.0)
MCH: 31 pg (ref 26.0–34.0)
MCHC: 32.6 g/dL (ref 30.0–36.0)
MCV: 95 fL (ref 78.0–100.0)
Monocytes Absolute: 1.2 10*3/uL — ABNORMAL HIGH (ref 0.1–1.0)
Monocytes Relative: 10 %
Neutro Abs: 9 10*3/uL — ABNORMAL HIGH (ref 1.7–7.7)
Neutrophils Relative %: 78 %
Platelets: 256 10*3/uL (ref 150–400)
RBC: 5 MIL/uL (ref 4.22–5.81)
RDW: 14.9 % (ref 11.5–15.5)
WBC: 11.6 10*3/uL — ABNORMAL HIGH (ref 4.0–10.5)

## 2016-09-11 LAB — URINALYSIS, ROUTINE W REFLEX MICROSCOPIC
Bilirubin Urine: NEGATIVE
Glucose, UA: NEGATIVE mg/dL
Hgb urine dipstick: NEGATIVE
Ketones, ur: NEGATIVE mg/dL
Leukocytes, UA: NEGATIVE
Nitrite: NEGATIVE
Protein, ur: NEGATIVE mg/dL
Specific Gravity, Urine: 1.019 (ref 1.005–1.030)
pH: 5 (ref 5.0–8.0)

## 2016-09-11 LAB — BASIC METABOLIC PANEL
Anion gap: 9 (ref 5–15)
BUN: 19 mg/dL (ref 6–20)
CO2: 33 mmol/L — ABNORMAL HIGH (ref 22–32)
Calcium: 9.6 mg/dL (ref 8.9–10.3)
Chloride: 96 mmol/L — ABNORMAL LOW (ref 101–111)
Creatinine, Ser: 1.08 mg/dL (ref 0.61–1.24)
GFR calc Af Amer: >60 mL/min (ref >60)
GFR calc non Af Amer: >60 mL/min (ref >60)
Glucose, Bld: 175 mg/dL — ABNORMAL HIGH (ref 65–99)
Potassium: 3.3 mmol/L — ABNORMAL LOW (ref 3.5–5.1)
Sodium: 138 mmol/L (ref 135–145)

## 2016-09-11 LAB — TROPONIN I
Troponin I: 0.03 ng/mL (ref <0.03)
Troponin I: <0.03 ng/mL (ref <0.03)

## 2016-09-11 LAB — BRAIN NATRIURETIC PEPTIDE: B Natriuretic Peptide: 733 pg/mL — ABNORMAL HIGH (ref 0.0–100.0)

## 2016-09-11 MED ORDER — ROSUVASTATIN CALCIUM 20 MG PO TABS
40.0000 mg | ORAL_TABLET | Freq: Every day | ORAL | Status: DC
  Administered 2016-09-11 – 2016-09-18 (×8): 40 mg via ORAL
  Filled 2016-09-11 (×8): qty 2

## 2016-09-11 MED ORDER — SODIUM CHLORIDE 0.9 % IV SOLN: 250.0000 mL | INTRAVENOUS | Status: DC | PRN

## 2016-09-11 MED ORDER — METOLAZONE 5 MG PO TABS
2.5000 mg | ORAL_TABLET | Freq: Two times a day (BID) | ORAL | Status: DC
  Filled 2016-09-11: qty 1

## 2016-09-11 MED ORDER — ACETAMINOPHEN 325 MG PO TABS
650.0000 mg | ORAL_TABLET | ORAL | Status: DC | PRN
  Administered 2016-09-13 – 2016-09-16 (×5): 650 mg via ORAL
  Filled 2016-09-11 (×6): qty 2

## 2016-09-11 MED ORDER — POTASSIUM CHLORIDE CRYS ER 20 MEQ PO TBCR
40.0000 meq | EXTENDED_RELEASE_TABLET | Freq: Two times a day (BID) | ORAL | Status: DC
  Administered 2016-09-11: 40 meq via ORAL
  Filled 2016-09-11: qty 2

## 2016-09-11 MED ORDER — FUROSEMIDE 10 MG/ML IJ SOLN
80.0000 mg | Freq: Once | INTRAMUSCULAR | Status: AC
  Administered 2016-09-11: 80 mg via INTRAVENOUS
  Filled 2016-09-11: qty 8

## 2016-09-11 MED ORDER — ENOXAPARIN SODIUM 80 MG/0.8ML ~~LOC~~ SOLN
70.0000 mg | SUBCUTANEOUS | Status: DC
  Administered 2016-09-11 – 2016-09-17 (×7): 70 mg via SUBCUTANEOUS
  Filled 2016-09-11 (×7): qty 0.8

## 2016-09-11 MED ORDER — HYDROCODONE-ACETAMINOPHEN 10-325 MG PO TABS
1.0000 | ORAL_TABLET | Freq: Three times a day (TID) | ORAL | Status: DC | PRN
  Administered 2016-09-12 – 2016-09-18 (×8): 1 via ORAL
  Filled 2016-09-11 (×12): qty 1

## 2016-09-11 MED ORDER — ASPIRIN 325 MG PO TABS
325.0000 mg | ORAL_TABLET | Freq: Every day | ORAL | Status: DC
  Administered 2016-09-12 – 2016-09-18 (×7): 325 mg via ORAL
  Filled 2016-09-11 (×7): qty 1

## 2016-09-11 MED ORDER — SODIUM CHLORIDE 0.9% FLUSH: 3.0000 mL | INTRAVENOUS | Status: DC | PRN

## 2016-09-11 MED ORDER — SODIUM CHLORIDE 0.9% FLUSH
3.0000 mL | Freq: Two times a day (BID) | INTRAVENOUS | Status: DC
  Administered 2016-09-11: 3 mL via INTRAVENOUS

## 2016-09-11 MED ORDER — CLOPIDOGREL BISULFATE 75 MG PO TABS
75.0000 mg | ORAL_TABLET | Freq: Every day | ORAL | Status: DC
  Administered 2016-09-12 – 2016-09-17 (×6): 75 mg via ORAL
  Filled 2016-09-11 (×6): qty 1

## 2016-09-11 MED ORDER — HYDROCHLOROTHIAZIDE 25 MG PO TABS
25.0000 mg | ORAL_TABLET | Freq: Once | ORAL | Status: AC
Start: 2016-09-11 — End: 2016-09-11
  Administered 2016-09-11: 25 mg via ORAL
  Filled 2016-09-11: qty 1

## 2016-09-11 MED ORDER — LISINOPRIL 5 MG PO TABS: 5.0000 mg | ORAL_TABLET | Freq: Every day | ORAL | Status: DC

## 2016-09-11 MED ORDER — POTASSIUM CHLORIDE CRYS ER 20 MEQ PO TBCR
60.0000 meq | EXTENDED_RELEASE_TABLET | Freq: Once | ORAL | Status: AC
  Administered 2016-09-11: 60 meq via ORAL
  Filled 2016-09-11: qty 3

## 2016-09-11 MED ORDER — MORPHINE SULFATE (PF) 4 MG/ML IV SOLN
6.0000 mg | Freq: Once | INTRAVENOUS | Status: AC
Start: 2016-09-11 — End: 2016-09-11
  Administered 2016-09-11: 6 mg via INTRAVENOUS
  Filled 2016-09-11: qty 2

## 2016-09-11 MED ORDER — ONDANSETRON HCL 4 MG/2ML IJ SOLN
4.0000 mg | Freq: Four times a day (QID) | INTRAMUSCULAR | Status: DC | PRN
  Administered 2016-09-12 – 2016-09-15 (×3): 4 mg via INTRAVENOUS
  Filled 2016-09-11 (×3): qty 2

## 2016-09-11 MED ORDER — FUROSEMIDE 10 MG/ML IJ SOLN
60.0000 mg | Freq: Four times a day (QID) | INTRAMUSCULAR | Status: DC
  Administered 2016-09-11: 60 mg via INTRAVENOUS
  Filled 2016-09-11: qty 6

## 2016-09-11 NOTE — Progress Notes (Signed)
Subjective:  Presents for c/o extreme SOB over the past week. Has CHF. Fluid retention despite taking 3 Lasix per day for 4-5 days. States Lasix is not working anymore. Has to sleep in a recliner to be comfortable. Complex cardiac history. Also has tried 2 days of Miralax and Dulcolax with minimal results. No fever. No cough.   Objective:   BP 134/80   Temp 97.5 F (36.4 C) (Oral)   Ht 5' 10.5" (1.791 m)   Wt (!) 317 lb (143.8 kg)   SpO2 95%   BMI 44.84 kg/m  Alert, oriented. Extremely SOB with activity and talking. Color normal. O2 sat 95% on room air. Lungs clear. Heart RRR. Abdomen very distended, tight with BS x 4. Lower extremities 3-4 + pitting edema.   Assessment:   Problem List Items Addressed This Visit      Cardiovascular and Mediastinum   CHF exacerbation ([place]) - Primary    Other Visit Diagnoses    Dyspnea on exertion       Orthopnea       Constipation, unspecified constipation type           Plan:   Patient sent to ED for further work up.

## 2016-09-11 NOTE — H&P (Signed)
History and Physical    DOHN STCLAIR KGU:[id number] DOB: 08-Feb-1951 DOA: 09/11/2016  PCP: Sallee Lange, MD Consultants:  Johnsie Cancel - cardiology; Curt Bears - EP Patient coming from: home - lives with wife; NOK: wife, [phone]  Chief Complaint: SOB  HPI: [name] [name] is a 66 y.o. male with medical history significant of morbid obesity, OSA (not yet on CPAP), HLD, HTN, diabetes, CAD s/p 6v CABG in 2001 and subsequent PCI, AICD, and CHF (05/08/16 Echo with EF 15% and grade 3 diastolic dysfunction) presenting because "furosemide ain't working".  He notes that he started with a sinus infection several weeks ago (there is no office note from his PCP office about this).  Just about the time it started getting better, over the last 1 1/2 weeks he has noticed increasing weight (47 pounds), SOB.  Currently weighs the most he has ever weighed - 314 in his "briefs."  No unusual cough.  Having to sleep in a recliner since the onset of symptoms.  Gets dry overnight and drinks water.  Has not peed much since Lasix and suddenly needs to go.  He reports that he has been unable to successfully void more than small amounts since this started, despite escalating his dose of Lasix to 120 mg PO TID.   ED Course:  Given morphine 6 mg IV, KCl 60 mEq PO, and Lasix 80 mg IV with eventual diuresis  Review of Systems: As per HPI; otherwise 10 point review of systems reviewed and negative.   Ambulatory Status:  Ambulated fine a week and a half ago, now it hurts to walk to the bathroom a few feet  Past Medical History:  Diagnosis Date  . AICD (automatic cardioverter/defibrillator) present 05/18/2016  . CAD (coronary artery disease)   . Cardiomyopathy, ischemic   . CHF (congestive heart failure) ([place])   . Chronic back pain   . Diabetes mellitus without complication (HCC)    prediabetes  . Elevated PSA   . Hyperlipidemia   . Morbid obesity ([place])   . OSA (obstructive sleep apnea)    does not wear CPAP, thinks one  has been ordered    Past Surgical History:  Procedure Laterality Date  . APPENDECTOMY    . CAROTID STENT    . CATARACT EXTRACTION, BILATERAL    . CORONARY ARTERY BYPASS GRAFT    . EP IMPLANTABLE DEVICE N/A 05/18/2016   Procedure: ICD Implant;  Surgeon: Will Meredith Leeds, MD;  Location: [facility];  Service: Cardiovascular;  Laterality: N/A;  . ingrown toenail N/A   . LEFT HEART CATHETERIZATION WITH CORONARY/GRAFT ANGIOGRAM N/A 12/21/2014   Procedure: LEFT HEART CATHETERIZATION WITH Beatrix Fetters;  Surgeon: Belva Crome, MD;  Location: [facility] CATH LAB;  Service: Cardiovascular;  Laterality: N/A;    Social History   Social History  . Marital status: Married    Spouse name: N/A  . Number of children: N/A  . Years of education: N/A   Occupational History  . retired    Social History Main Topics  . Smoking status: Former Research scientist (life sciences)  . Smokeless tobacco: Never Used  . Alcohol use No  . Drug use: No  . Sexual activity: Not on file   Other Topics Concern  . Not on file   Social History Narrative  . No narrative on file    Allergies  Allergen Reactions  . Dyflex-G [Dyphylline-Guaifenesin] Hives    Family History  Problem Relation Age of Onset  . Adopted: Yes  . Family  history unknown: Yes    Prior to Admission medications   Medication Sig Start Date End Date Taking? Authorizing Provider  aspirin 325 MG tablet Take 325 mg by mouth daily.   Yes Historical Provider, MD  clopidogrel (PLAVIX) 75 MG tablet Take 1 tablet (75 mg total) by mouth daily. 07/11/16  Yes Kathyrn Drown, MD  furosemide (LASIX) 40 MG tablet Take 1 tablet (40 mg total) by mouth daily as needed for edema. 07/11/16  Yes Kathyrn Drown, MD  HYDROcodone-acetaminophen (NORCO) 10-325 MG tablet Take 1 tablet by mouth every 8 (eight) hours as needed. Patient taking differently: Take 1 tablet by mouth every 8 (eight) hours as needed for moderate pain.  07/11/16  Yes Kathyrn Drown, MD  lisinopril  (PRINIVIL,ZESTRIL) 5 MG tablet Take 1 tablet (5 mg total) by mouth daily. 07/11/16  Yes Kathyrn Drown, MD  Multiple Vitamins-Minerals (CENTRUM SILVER PO) Take 1 tablet by mouth daily.    Yes Historical Provider, MD  nitroGLYCERIN (NITROSTAT) 0.4 MG SL tablet PLACE 1 TABLET UNDER TONGUE FOR CHEST PAIN. MAY REPEAT EVERY 5 MIN UPTO 3 DOSES-NO RELIEF,CALL 911. 08/23/16  Yes Kathyrn Drown, MD  rosuvastatin (CRESTOR) 40 MG tablet Take 1 tablet (40 mg total) by mouth daily. 07/11/16  Yes Kathyrn Drown, MD  trolamine salicylate (ASPERCREME) 10 % cream Apply 1 application topically as needed for muscle pain.   Yes Historical Provider, MD    Physical Exam: Vitals:   09/11/16 1815 09/11/16 1900 09/11/16 2000 09/11/16 2030  BP: 123/90 130/99 110/84 122/82  Pulse: 94 100 96 101  Resp: 22 17 (!) 27 22  Temp:      SpO2: 99% 94% 95% 97%  Weight:      Height:         General:  Appears calm and comfortable and is NAD; he is morbidly obese and very loquacious Eyes:  PERRL, EOMI, normal lids, iris ENT:  grossly normal hearing, lips & tongue, mmm Neck:  no LAD, masses or thyromegaly Cardiovascular:  RRR, no m/r/g. 2-3+ pitting LE edema.  Respiratory:  Bibasilar crackles midway up the lung fields. Slightly increased respiratory effort - but this does not hinder his conversational attempts. Abdomen:  soft, ntnd, NABS Skin:  no rash or induration seen on limited exam Musculoskeletal:  grossly normal tone BUE/BLE, good ROM, no bony abnormality Psychiatric:  grossly normal mood and affect, speech fluent and appropriate, AOx3 Neurologic:  CN 2-12 grossly intact, moves all extremities in coordinated fashion, sensation intact  Labs on Admission: I have personally reviewed following labs and imaging studies  CBC:  Recent Labs Lab 09/11/16 1524  WBC 11.6*  NEUTROABS 9.0*  HGB 15.5  HCT 47.5  MCV 95.0  PLT 557   Basic Metabolic Panel:  Recent Labs Lab 09/11/16 1524  NA 138  K 3.3*  CL 96*    CO2 33*  GLUCOSE 175*  BUN 19  CREATININE 1.08  CALCIUM 9.6   GFR: Estimated Creatinine Clearance: 97.9 mL/min (by C-G formula based on SCr of 1.08 mg/dL). Liver Function Tests: No results for input(s): AST, ALT, ALKPHOS, BILITOT, PROT, ALBUMIN in the last 168 hours. No results for input(s): LIPASE, AMYLASE in the last 168 hours. No results for input(s): AMMONIA in the last 168 hours. Coagulation Profile: No results for input(s): INR, PROTIME in the last 168 hours. Cardiac Enzymes:  Recent Labs Lab 09/11/16 1524  TROPONINI 0.03*   BNP (last 3 results) No results for input(s): PROBNP in  the last 8760 hours. HbA1C: No results for input(s): HGBA1C in the last 72 hours. CBG: No results for input(s): GLUCAP in the last 168 hours. Lipid Profile: No results for input(s): CHOL, HDL, LDLCALC, TRIG, CHOLHDL, LDLDIRECT in the last 72 hours. Thyroid Function Tests: No results for input(s): TSH, T4TOTAL, FREET4, T3FREE, THYROIDAB in the last 72 hours. Anemia Panel: No results for input(s): VITAMINB12, FOLATE, FERRITIN, TIBC, IRON, RETICCTPCT in the last 72 hours. Urine analysis:    Component Value Date/Time   COLORURINE YELLOW 09/11/2016 1628   APPEARANCEUR CLEAR 09/11/2016 1628   LABSPEC 1.019 09/11/2016 1628   PHURINE 5.0 09/11/2016 1628   GLUCOSEU NEGATIVE 09/11/2016 1628   HGBUR NEGATIVE 09/11/2016 1628   BILIRUBINUR NEGATIVE 09/11/2016 1628   KETONESUR NEGATIVE 09/11/2016 1628   PROTEINUR NEGATIVE 09/11/2016 1628   NITRITE NEGATIVE 09/11/2016 1628   LEUKOCYTESUR NEGATIVE 09/11/2016 1628    Creatinine Clearance: Estimated Creatinine Clearance: 97.9 mL/min (by C-G formula based on SCr of 1.08 mg/dL).  Sepsis Labs: @LABRCNTIP (procalcitonin:4,lacticidven:4) )No results found for this or any previous visit (from the past 240 hour(s)).   Radiological Exams on Admission: Dg Chest 2 View  Result Date: 09/11/2016 CLINICAL DATA:  Source of breath, CHF with chest pain and  weight gain 10 days. EXAM: CHEST  2 VIEW COMPARISON:  05/19/2016 FINDINGS: Sternotomy wires and left-sided pacemaker unchanged. Lordotic technique is demonstrated. Lungs are adequately inflated with findings suggesting small amount of bilateral pleural fluid on the lateral film. Cannot exclude airspace opacification over the mid to lower lungs posteriorly on the lateral film which may be due to vascular congestion or infection. Stable cardiomegaly. Remainder of the exam is unchanged. IMPRESSION: Cannot exclude airspace opacification over the posterior mid to lower lungs on the lateral film which may be due to vascular congestion or infection. Small amount of bilateral pleural fluid posteriorly. Stable cardiomegaly. Electronically Signed   By: Marin Olp M.D.   On: 09/11/2016 16:31   Dg Abdomen 1 View  Result Date: 09/11/2016 CLINICAL DATA:  Dyspnea. EXAM: ABDOMEN - 1 VIEW COMPARISON:  None. FINDINGS: Bowel gas pattern is nonobstructive. 1 cm calcific density over the right upper quadrant as cannot exclude a renal or gallbladder stone. There are degenerative changes of the spine and mild degenerate changes of the hips. IMPRESSION: Nonobstructive bowel gas pattern. 1 cm calcific density over the right upper quadrant which may represent a gallstone or renal stone. Electronically Signed   By: Marin Olp M.D.   On: 09/11/2016 16:33    EKG: Independently reviewed.  NSR with rate 99; poor R wave progression; prolonged QT (504)   Assessment/Plan Principal Problem:   Acute on chronic congestive heart failure (HCC) Active Problems:   Hyperlipemia   Morbid obesity (HCC)   Obstructive sleep apnea   Diabetes mellitus, type 2 (HCC)   Essential hypertension   Prolonged Q-T interval on ECG   Hypokalemia   CHF exacerbation -Patient with worsening SOB and marked weight gain -Lung exam is consistent with pulmonary edema -Elevated BNP - 733 (prior 547.7 in 2/17) -CXR does not clearly diagnose CHF -With  elevated BNP and possibly abnl CXR with known severe combined heart failure, CHF seems likely diagnosis -Will admit with telemetry -Will request repeat echocardiogram -Will continue ASA -Will continue Lisinopril 5 mg daily  -No beta blocker due to acute exacerbation on presentation but he would likely benefit from Coreg once stabilized; according to his last note by Dr. Baird Kay, he was changed from Lopressor to Toprol XL  in December, so it is not clear why no beta blocker is listed on his home medications -CHF order set utilized; CHF team consult ordered and team was paged without response -Was given Lasix 80 mg x 1 in ER  -Will provide Zaroxolyn BID and Lasix 60 mg q6h IV for now based on his report of marked (almost 50 pounds) weight gain. -Continue [place] O2 for now -Normal kidney function at this time, will follow -Repeat EKG in AM -Will r/o with serial troponins although doubt ACS based on symptoms -Has AICD  Prolonged QT -May be related to low K+ or may be due to severely depressed EF -Will attempt to avoid QT-prolonging medications  Hypokalemia -K 3.3 -He was given 60 mEq PO in the ER -Given the large amount of diuretics he will be receiving, will start 40 mEq PO BID and follow with daily BMP  Diabetes -Glucose 175 -Patient reports that he doesn't have diabetes -Most recent A1c was 6.4 in 11/17 -Suggest consideration of metformin once this acute exacerbation has resolved  HTN -Continue ACE, as above -Consider restarting beta blocker when appropriate  HLD -Continue Crestor  Morbid obesity -May benefit from nutrition education  OSA -Not yet fitted for CPAP -Could consider autopap while inpatient  DVT prophylaxis: Lovenox  Code Status: DNI - confirmed with patient/family Family Communication: Wife present throughout Disposition Plan:  Home once clinically improved Consults called: CHF team (did not return page)  Admission status: Admit - It is my clinical opinion that  admission to INPATIENT is reasonable and necessary because this patient will require at least 2 midnights in the hospital to treat this condition based on the medical complexity of the problems presented.  Given the aforementioned information, the predictability of an adverse outcome is felt to be significant.    Karmen Bongo MD [facility]  If 7PM-7AM, please contact night-coverage [email or web address] Password [facility]  09/11/2016, 9:16 PM

## 2016-09-11 NOTE — ED Notes (Signed)
CRITICAL VALUE ALERT  Critical value received:  Troponin 0.03  Date of notification:  09/11/2016  Time of notification:  1636  Critical value read back:  Yes  Nurse who received alert:  Cena Benton  MD notified (1st page):  Dr. Wilson Singer  Time of first page:  1637  Responding MD:  Dr. Wilson Singer  Time MD responded:  [phone]

## 2016-09-11 NOTE — ED Provider Notes (Signed)
[facility] DEPT Provider Note   CSN: [id number] Arrival date & time: 09/11/16  1433     History   Chief Complaint Chief Complaint  Patient presents with  . Shortness of Breath    HPI [name] [name] is a 66 y.o. male.  HPI  66 y.o. male with medical history significant of morbid obesity, OSA (not yet on CPAP), HLD, HTN, diabetes, CAD s/p 6v CABG in 2001 and subsequent PCI, AICD, and CHF (05/08/16 Echo with EF 15% and grade 3 diastolic dysfunction) presenting because "furosemide ain't working".  He notes that he started with a sinus infection several weeks ago (there is no office note from his PCP office about this).  Just about the time it started getting better, over the last 1 1/2 weeks he has noticed increasing weight (47 pounds), SOB.  Currently weighs the most he has ever weighed - 314 in his "briefs."  No unusual cough.  Having to sleep in a recliner since the onset of symptoms.  Gets dry overnight and drinks water.  Has not peed much since Lasix and suddenly needs to go.  He reports that he has been unable to successfully void more than small amounts since this started, despite escalating his dose of Lasix to 120 mg PO TID.  Past Medical History:  Diagnosis Date  . AICD (automatic cardioverter/defibrillator) present 05/18/2016  . CAD (coronary artery disease)   . Cardiomyopathy, ischemic   . CHF (congestive heart failure) ([place])   . Chronic back pain   . Diabetes mellitus without complication (HCC)    prediabetes  . Elevated PSA   . Hyperlipidemia   . Morbid obesity ([place])   . OSA (obstructive sleep apnea)     Patient Active Problem List   Diagnosis Date Noted  . CHF (congestive heart failure) ([place]) 05/18/2016  . CHF exacerbation ([place]) 10/22/2015  . Chest pain 10/22/2015  . Diabetes mellitus, type 2 ([place]) 10/22/2015  . Acute on chronic congestive heart failure ([place])   . Essential hypertension   . Coronary artery disease due to lipid rich plaque   . Chronic lumbar  pain 01/01/2015  . Obstructive sleep apnea 10/02/2014  . Cardiomyopathy, ischemic 06/25/2014  . Congestive heart failure ([place]) 10/29/2013  . Right ankle pain 08/14/2013  . Hyperlipemia 05/14/2013  . CAD (coronary artery disease) of artery bypass graft 05/14/2013  . Prediabetes 05/14/2013  . Morbid obesity ([place]) 05/14/2013  . Elevated PSA 05/14/2013    Past Surgical History:  Procedure Laterality Date  . APPENDECTOMY    . CAROTID STENT    . CATARACT EXTRACTION, BILATERAL    . CORONARY ARTERY BYPASS GRAFT    . EP IMPLANTABLE DEVICE N/A 05/18/2016   Procedure: ICD Implant;  Surgeon: Will Meredith Leeds, MD;  Location: [place] [facility];  Service: Cardiovascular;  Laterality: N/A;  . ingrown toenail N/A   . LEFT HEART CATHETERIZATION WITH CORONARY/GRAFT ANGIOGRAM N/A 12/21/2014   Procedure: LEFT HEART CATHETERIZATION WITH Beatrix Fetters;  Surgeon: Belva Crome, MD;  Location: [facility] CATH LAB;  Service: Cardiovascular;  Laterality: N/A;       Home Medications    Prior to Admission medications   Medication Sig Start Date End Date Taking? Authorizing Provider  aspirin 325 MG tablet Take 325 mg by mouth daily.    Historical Provider, MD  clopidogrel (PLAVIX) 75 MG tablet Take 1 tablet (75 mg total) by mouth daily. 07/11/16   Kathyrn Drown, MD  furosemide (LASIX) 40 MG tablet Take 1  tablet (40 mg total) by mouth daily as needed for edema. 07/11/16   Kathyrn Drown, MD  HYDROcodone-acetaminophen (NORCO) 10-325 MG tablet Take 1 tablet by mouth every 8 (eight) hours as needed. 07/11/16   Kathyrn Drown, MD  lisinopril (PRINIVIL,ZESTRIL) 5 MG tablet Take 1 tablet (5 mg total) by mouth daily. 07/11/16   Kathyrn Drown, MD  metoprolol succinate (TOPROL-XL) 50 MG 24 hr tablet Take 1 tablet (50 mg total) by mouth daily. Take with or immediately following a meal. 02/07/16   Will Meredith Leeds, MD  Multiple Vitamins-Minerals (CENTRUM SILVER PO) Take 1 tablet by mouth daily.     Historical  Provider, MD  nitroGLYCERIN (NITROSTAT) 0.4 MG SL tablet PLACE 1 TABLET UNDER TONGUE FOR CHEST PAIN. MAY REPEAT EVERY 5 MIN UPTO 3 DOSES-NO RELIEF,CALL 911. 08/23/16   Kathyrn Drown, MD  rosuvastatin (CRESTOR) 40 MG tablet Take 1 tablet (40 mg total) by mouth daily. 07/11/16   Kathyrn Drown, MD  trolamine salicylate (ASPERCREME) 10 % cream Apply 1 application topically as needed for muscle pain.    Historical Provider, MD    Family History Family History  Problem Relation Age of Onset  . Adopted: Yes  . Family history unknown: Yes    Social History Social History  Substance Use Topics  . Smoking status: Former Research scientist (life sciences)  . Smokeless tobacco: Never Used  . Alcohol use No     Allergies   Dyflex-g [dyphylline-guaifenesin]   Review of Systems Review of Systems  All systems reviewed and negative, other than as noted in HPI.  Physical Exam Updated Vital Signs BP 115/84 (BP Location: Left Arm)   Pulse 96   Temp 98.2 F (36.8 C)   Resp 26   Ht 5' 10.5" (1.791 m)   Wt (!) 314 lb (142.4 kg)   SpO2 98%   BMI 44.42 kg/m   Physical Exam  Constitutional: No distress.  Morbidly obese  HENT:  Head: Normocephalic and atraumatic.  Eyes: Conjunctivae are normal. Right eye exhibits no discharge. Left eye exhibits no discharge.  Neck: Neck supple.  Cardiovascular: Normal rate, regular rhythm and normal heart sounds.  Exam reveals no gallop and no friction rub.   No murmur heard. Pulmonary/Chest: Effort normal and breath sounds normal. No respiratory distress.  Abdominal: Soft. He exhibits no distension. There is no tenderness.  Musculoskeletal: He exhibits no edema or tenderness.  Lower extremities symmetric as compared to each other. No calf tenderness. Negative Homan's. No palpable cords.   Neurological: He is alert.  Skin: Skin is warm and dry.  Psychiatric: He has a normal mood and affect. His behavior is normal. Thought content normal.  Nursing note and vitals  reviewed.    ED Treatments / Results  Labs (all labs ordered are listed, but only abnormal results are displayed) Labs Reviewed  CBC WITH DIFFERENTIAL/PLATELET - Abnormal; Notable for the following:       Result Value   WBC 11.6 (*)    Neutro Abs 9.0 (*)    Monocytes Absolute 1.2 (*)    All other components within normal limits  BRAIN NATRIURETIC PEPTIDE - Abnormal; Notable for the following:    B Natriuretic Peptide 733.0 (*)    All other components within normal limits  BASIC METABOLIC PANEL - Abnormal; Notable for the following:    Potassium 3.3 (*)    Chloride 96 (*)    CO2 33 (*)    Glucose, Bld 175 (*)    All  other components within normal limits  TROPONIN I - Abnormal; Notable for the following:    Troponin I 0.03 (*)    All other components within normal limits  BASIC METABOLIC PANEL - Abnormal; Notable for the following:    Chloride 99 (*)    Glucose, Bld 187 (*)    BUN 21 (*)    Creatinine, Ser 1.27 (*)    Calcium 8.8 (*)    GFR calc non Af Amer 58 (*)    All other components within normal limits  CBC WITH DIFFERENTIAL/PLATELET - Abnormal; Notable for the following:    WBC 10.8 (*)    Neutro Abs 8.4 (*)    Monocytes Absolute 1.1 (*)    All other components within normal limits  GLUCOSE, CAPILLARY - Abnormal; Notable for the following:    Glucose-Capillary 160 (*)    All other components within normal limits  BASIC METABOLIC PANEL - Abnormal; Notable for the following:    Potassium 3.2 (*)    Chloride 93 (*)    Glucose, Bld 175 (*)    BUN 24 (*)    Calcium 8.2 (*)    All other components within normal limits  URINALYSIS, ROUTINE W REFLEX MICROSCOPIC - Abnormal; Notable for the following:    Protein, ur TRACE (*)    All other components within normal limits  BASIC METABOLIC PANEL - Abnormal; Notable for the following:    Potassium 3.2 (*)    Chloride 92 (*)    Glucose, Bld 155 (*)    BUN 30 (*)    Creatinine, Ser 1.35 (*)    Calcium 7.9 (*)    GFR  calc non Af Amer 54 (*)    All other components within normal limits  BASIC METABOLIC PANEL - Abnormal; Notable for the following:    Sodium 133 (*)    Chloride 93 (*)    Glucose, Bld 181 (*)    BUN 34 (*)    Creatinine, Ser 1.33 (*)    Calcium 8.4 (*)    GFR calc non Af Amer 55 (*)    All other components within normal limits  BASIC METABOLIC PANEL - Abnormal; Notable for the following:    Sodium 134 (*)    Chloride 92 (*)    CO2 33 (*)    Glucose, Bld 143 (*)    BUN 38 (*)    Creatinine, Ser 1.52 (*)    GFR calc non Af Amer 46 (*)    GFR calc Af Amer 54 (*)    All other components within normal limits  BASIC METABOLIC PANEL - Abnormal; Notable for the following:    Sodium 133 (*)    Chloride 89 (*)    CO2 35 (*)    Glucose, Bld 140 (*)    BUN 39 (*)    Creatinine, Ser 1.56 (*)    GFR calc non Af Amer 45 (*)    GFR calc Af Amer 52 (*)    All other components within normal limits  MAGNESIUM - Abnormal; Notable for the following:    Magnesium 2.5 (*)    All other components within normal limits  BASIC METABOLIC PANEL - Abnormal; Notable for the following:    Chloride 89 (*)    CO2 35 (*)    Glucose, Bld 198 (*)    BUN 38 (*)    Creatinine, Ser 1.51 (*)    Calcium 8.5 (*)    GFR calc non Af Amer 47 (*)  GFR calc Af Amer 54 (*)    All other components within normal limits  URINE CULTURE  URINALYSIS, ROUTINE W REFLEX MICROSCOPIC  TROPONIN I  TROPONIN I  TROPONIN I  TSH  MAGNESIUM  URINALYSIS, MICROSCOPIC (REFLEX)  SODIUM, URINE, RANDOM  MAGNESIUM  MAGNESIUM  ALBUMIN  PROTEIN, TOTAL  MAGNESIUM    EKG  EKG Interpretation None       Radiology No results found.  Procedures Procedures (including critical care time)  Medications Ordered in ED Medications - No data to display   Initial Impression / Assessment and Plan / ED Course  I have reviewed the triage vital signs and the nursing notes.  Pertinent labs & imaging results that were available  during my care of the patient were reviewed by me and considered in my medical decision making (see chart for details).  Clinical Course      Final Clinical Impressions(s) / ED Diagnoses   Final diagnoses:  Anasarca  Constipation  Pain in the abdomen    New Prescriptions New Prescriptions   No medications on file     Virgel Manifold, MD 09/24/16 2109

## 2016-09-11 NOTE — ED Triage Notes (Signed)
C/o sob,  Chest pain and weight gain times 10 days. States he is not able to have a good bowel movement for a while.  Seen PCP today and sent here to the ER.

## 2016-09-11 NOTE — Progress Notes (Signed)
Notified MD, per pharmacy that pt's QT interval is prolonged and is a contraindication for Lasix and Zofran. Notified MD, and Dr. Lorin Mercy states pt needs the Lasix and Zofran can be held. Will continue to monitor pt.

## 2016-09-12 ENCOUNTER — Inpatient Hospital Stay (HOSPITAL_COMMUNITY): Payer: Medicare Other

## 2016-09-12 DIAGNOSIS — I2510 Atherosclerotic heart disease of native coronary artery without angina pectoris: Secondary | ICD-10-CM

## 2016-09-12 DIAGNOSIS — I5033 Acute on chronic diastolic (congestive) heart failure: Secondary | ICD-10-CM

## 2016-09-12 DIAGNOSIS — I350 Nonrheumatic aortic (valve) stenosis: Secondary | ICD-10-CM

## 2016-09-12 DIAGNOSIS — I5023 Acute on chronic systolic (congestive) heart failure: Secondary | ICD-10-CM

## 2016-09-12 LAB — BASIC METABOLIC PANEL
Anion gap: 9 (ref 5–15)
BUN: 21 mg/dL — ABNORMAL HIGH (ref 6–20)
CALCIUM: 8.8 mg/dL — AB (ref 8.9–10.3)
CO2: 32 mmol/L (ref 22–32)
CREATININE: 1.27 mg/dL — AB (ref 0.61–1.24)
Chloride: 99 mmol/L — ABNORMAL LOW (ref 101–111)
GFR, EST NON AFRICAN AMERICAN: 58 mL/min — AB (ref >60)
Glucose, Bld: 187 mg/dL — ABNORMAL HIGH (ref 65–99)
Potassium: 4 mmol/L (ref 3.5–5.1)
SODIUM: 140 mmol/L (ref 135–145)

## 2016-09-12 LAB — CBC WITH DIFFERENTIAL/PLATELET
BASOS PCT: 0 %
Basophils Absolute: 0 10*3/uL (ref 0.0–0.1)
Eosinophils Absolute: 0.1 10*3/uL (ref 0.0–0.7)
Eosinophils Relative: 1 %
HEMATOCRIT: 42.9 % (ref 39.0–52.0)
Hemoglobin: 14.1 g/dL (ref 13.0–17.0)
Lymphocytes Relative: 11 %
Lymphs Abs: 1.2 10*3/uL (ref 0.7–4.0)
MCH: 31.1 pg (ref 26.0–34.0)
MCHC: 32.9 g/dL (ref 30.0–36.0)
MCV: 94.5 fL (ref 78.0–100.0)
MONO ABS: 1.1 10*3/uL — AB (ref 0.1–1.0)
MONOS PCT: 10 %
Neutro Abs: 8.4 10*3/uL — ABNORMAL HIGH (ref 1.7–7.7)
Neutrophils Relative %: 78 %
Platelets: 261 10*3/uL (ref 150–400)
RBC: 4.54 MIL/uL (ref 4.22–5.81)
RDW: 15 % (ref 11.5–15.5)
WBC: 10.8 10*3/uL — ABNORMAL HIGH (ref 4.0–10.5)

## 2016-09-12 LAB — GLUCOSE, CAPILLARY: GLUCOSE-CAPILLARY: 160 mg/dL — AB (ref 65–99)

## 2016-09-12 LAB — TROPONIN I

## 2016-09-12 LAB — ECHOCARDIOGRAM COMPLETE
HEIGHTINCHES: 70 in
Weight: 4976 oz

## 2016-09-12 MED ORDER — METOLAZONE 5 MG PO TABS
2.5000 mg | ORAL_TABLET | Freq: Once | ORAL | Status: AC
  Administered 2016-09-12: 2.5 mg via ORAL

## 2016-09-12 MED ORDER — BISACODYL 10 MG RE SUPP
10.0000 mg | Freq: Every day | RECTAL | Status: DC | PRN
  Administered 2016-09-12 – 2016-09-13 (×2): 10 mg via RECTAL
  Filled 2016-09-12 (×2): qty 1

## 2016-09-12 MED ORDER — CARVEDILOL 3.125 MG PO TABS
3.1250 mg | ORAL_TABLET | Freq: Two times a day (BID) | ORAL | Status: DC
  Administered 2016-09-12 – 2016-09-14 (×3): 3.125 mg via ORAL
  Filled 2016-09-12 (×6): qty 1

## 2016-09-12 MED ORDER — LISINOPRIL 5 MG PO TABS
2.5000 mg | ORAL_TABLET | Freq: Every day | ORAL | Status: DC
  Filled 2016-09-12: qty 1

## 2016-09-12 MED ORDER — PERFLUTREN LIPID MICROSPHERE
1.0000 mL | INTRAVENOUS | Status: AC | PRN
  Administered 2016-09-12: 4 mL via INTRAVENOUS
  Filled 2016-09-12: qty 10

## 2016-09-12 MED ORDER — FUROSEMIDE 10 MG/ML IJ SOLN
40.0000 mg | Freq: Two times a day (BID) | INTRAMUSCULAR | Status: DC
  Administered 2016-09-12 – 2016-09-13 (×3): 40 mg via INTRAVENOUS
  Filled 2016-09-12 (×3): qty 4

## 2016-09-12 MED ORDER — LISINOPRIL 5 MG PO TABS
2.5000 mg | ORAL_TABLET | Freq: Every day | ORAL | Status: DC
  Administered 2016-09-13: 2.5 mg via ORAL
  Filled 2016-09-12: qty 1

## 2016-09-12 NOTE — Consult Note (Signed)
CARDIOLOGY CONSULT NOTE   Patient ID: [name] [name] MRN: [id number] DOB/AGE: Jan 10, 1951 66 y.o.  Admit Date: 09/11/2016 Referring Physician: Arlice Colt, MD Primary Physician: Sallee Lange, MD Consulting Cardiologist: Carlyle Dolly MD Primary Cardiologist: Dorris Carnes MD Reason for Consultation: CHF  Clinical Summary [name] [name] is a 66 y.o.male with history of hypertension, systolic dysfunction, with ischemic cardiomyopathy EF of 15%, ICD in situ, ( followed by Dr. Curt Bears), coronary artery disease status post CABG 6 in 2001, (LIMA to distal LAD, SVG to RI, SVG to OM 2, sequential SVG to our PLA, SVG to acute marginal, SVG to posterior descending artery), hyperlipidemia, with other history to include diabetes, and OSA not using CPAP.Marland Kitchen  He was seen by Dr.Camitz on 08/25/2016, and was without complaints.  ICD was interrogated functioning properly without any issues. He was noted to have some lower extremity edema but took extra doses of Lasix.  Was seen by primary care provider, Jyl Heinz NP, on 09/01/2016 with complaints of dyspnea over the past week, continue fluid retention despite taking extra Lasix for the last 4-5 days. Stated as the Lasix was no longer working. As result of his symptoms and evidence of decompensated heart failure, the patient was sent to the emergency room for admission and IV diuresis.  On arrival to the emergency room the patient's blood pressure is 134/80, O2 sat 95%, heart rate 99 bpm he was afebrile. Chest x-ray revealed bilateral pleural fluid posteriorly, airspace opacification  over the posterior mid to lower lungs on the lateral film, which may be due to vascular congestion or infection.  BNP 733. He was found to be hypokalemic with potassium of 3.3, creatinine 1.08. Troponin 0.03. White blood cells were elevated 11.6, hemoglobin 15.5 hematocrit 47.5. EKG revealed normal sinus rhythm with PVCs heart rate of 96 bpm.  He was treated with morphine,  Lasix 80 mg IV, potassium 60 mg tablets by mouth, and HCTZ 25 mg by mouth. He has diuresed 1.15 L since admission. Weight on arrival 314 pounds. When last seen by Dr.Camnitz weight was 302 pounds.   Allergies  Allergen Reactions  . Dyflex-G [Dyphylline-Guaifenesin] Hives    Medications Scheduled Medications: . aspirin  325 mg Oral Daily  . clopidogrel  75 mg Oral Daily  . enoxaparin (LOVENOX) injection  70 mg Subcutaneous Q24H  . furosemide  40 mg Intravenous BID  . [START ON 09/13/2016] lisinopril  2.5 mg Oral Daily  . metolazone  2.5 mg Oral Once  . rosuvastatin  40 mg Oral q1800     Infusions:   PRN Medications:  acetaminophen, HYDROcodone-acetaminophen, ondansetron (ZOFRAN) IV   Past Medical History:  Diagnosis Date  . AICD (automatic cardioverter/defibrillator) present 05/18/2016  . CAD (coronary artery disease)   . Cardiomyopathy, ischemic   . CHF (congestive heart failure) ([place])   . Chronic back pain   . Diabetes mellitus without complication (HCC)    prediabetes  . Elevated PSA   . Hyperlipidemia   . Morbid obesity ([place])   . OSA (obstructive sleep apnea)    does not wear CPAP, thinks one has been ordered    Past Surgical History:  Procedure Laterality Date  . APPENDECTOMY    . CAROTID STENT    . CATARACT EXTRACTION, BILATERAL    . CORONARY ARTERY BYPASS GRAFT    . EP IMPLANTABLE DEVICE N/A 05/18/2016   Procedure: ICD Implant;  Surgeon: Will Meredith Leeds, MD;  Location: [facility];  Service: Cardiovascular;  Laterality: N/A;  .  ingrown toenail N/A   . LEFT HEART CATHETERIZATION WITH CORONARY/GRAFT ANGIOGRAM N/A 12/21/2014   Procedure: LEFT HEART CATHETERIZATION WITH Beatrix Fetters;  Surgeon: Belva Crome, MD;  [facility];  Service: Cardiovascular;  Laterality: N/A;    Family History  Problem Relation Age of Onset  . Adopted: Yes  . Family history unknown: Yes     Social History [name] [name] reports that he has quit  smoking. He has never used smokeless tobacco. [name] [name] reports that he does not drink alcohol.  Review of Systems Complete review of systems are found to be negative unless outlined in H&P above.  Physical Examination Blood pressure 122/82, pulse 101, temperature 98.2 F (36.8 C), resp. rate 22, height 5\' 10"  (1.778 m), weight (!) 311 lb (141.1 kg), SpO2 97 %.  Intake/Output Summary (Last 24 hours) at 09/12/16 0900 Last data filed at 09/12/16 0440  Gross per 24 hour  Intake              480 ml  Output             1150 ml  Net             -670 ml    Telemetry:  GEN:NAD HEENT: Conjunctiva and lids normal, oropharynx clear with moist mucosa. Neck: Supple, no elevated JVP or carotid bruits, no thyromegaly. Lungs: Clear to auscultation, nonlabored breathing at rest. Cardiac: Regular rate and rhythm, 2/6 systolic murmur RUSB Abdomen: Soft, nontender, no hepatomegaly, bowel sounds present, no guarding or rebound. Extremities: 1+ bilataerl , distal pulses 2+. Skin: Warm and dry. Musculoskeletal: No kyphosis. Neuropsychiatric: Alert and oriented x3, affect grossly appropriate.  Prior Cardiac Testing/Procedures Echo 05/08/2016 Left ventricle: Extremely poor acoustic windows limit study Even   with contrast use. Overall LVEF is probably moderately to   severely depressed. The cavity size was severely dilated. Wall   thickness was normal. Doppler parameters are consistent with a   reversible restrictive pattern, indicative of decreased left   ventricular diastolic compliance and/or increased left atrial   pressure (grade 3 diastolic dysfunction). - Aortic valve: AV is thckened, calcified . Leaflets are diffcult   to see Peak and mean graidents through the valve are 31 and 22 mm   Hg respectivel y Consider TEE to furgher evaluate. - Mitral valve: There was mild regurgitation. - Left atrium: The atrium was severely dilated. - Pulmonary arteries: PA peak pressure: 36 mm Hg (S)   Lab  Results  Basic Metabolic Panel:  Recent Labs Lab 09/11/16 1524 09/12/16 0320  NA 138 140  K 3.3* 4.0  CL 96* 99*  CO2 33* 32  GLUCOSE 175* 187*  BUN 19 21*  CREATININE 1.08 1.27*  CALCIUM 9.6 8.8*    CBC:  Recent Labs Lab 09/11/16 1524 09/12/16 0320  WBC 11.6* 10.8*  NEUTROABS 9.0* 8.4*  HGB 15.5 14.1  HCT 47.5 42.9  MCV 95.0 94.5  PLT 256 261    Cardiac Enzymes:  Recent Labs Lab 09/11/16 1524 09/11/16 2153 09/12/16 0320  TROPONINI 0.03* <0.03 <0.03    Radiology: Dg Chest 2 View  Result Date: 09/11/2016 CLINICAL DATA:  Source of breath, CHF with chest pain and weight gain 10 days. EXAM: CHEST  2 VIEW COMPARISON:  05/19/2016 FINDINGS: Sternotomy wires and left-sided pacemaker unchanged. Lordotic technique is demonstrated. Lungs are adequately inflated with findings suggesting small amount of bilateral pleural fluid on the lateral film. Cannot exclude airspace opacification over the mid to lower lungs posteriorly on  the lateral film which may be due to vascular congestion or infection. Stable cardiomegaly. Remainder of the exam is unchanged. IMPRESSION: Cannot exclude airspace opacification over the posterior mid to lower lungs on the lateral film which may be due to vascular congestion or infection. Small amount of bilateral pleural fluid posteriorly. Stable cardiomegaly. Electronically Signed   By: Marin Olp M.D.   On: 09/11/2016 16:31   Dg Abdomen 1 View  Result Date: 09/11/2016 CLINICAL DATA:  Dyspnea. EXAM: ABDOMEN - 1 VIEW COMPARISON:  None. FINDINGS: Bowel gas pattern is nonobstructive. 1 cm calcific density over the right upper quadrant as cannot exclude a renal or gallbladder stone. There are degenerative changes of the spine and mild degenerate changes of the hips. IMPRESSION: Nonobstructive bowel gas pattern. 1 cm calcific density over the right upper quadrant which may represent a gallstone or renal stone. Electronically Signed   By: Marin Olp M.D.    On: 09/11/2016 16:33     ECG:   Impression and Recommendations  1. Acute on chronic systolic HF Patient presents with acute on chronic systolic HF. By charting his weight is up about 15 lbs from his baseline of 302 lbs. Negative 765mL yesterday, he received lasix 60mg  x 1 then 80mg  IV. Mild uptrend in Cr and BUN. Medical therapy for CHF with coreg 3.125, lisinopril 2.5. Repeat echo pending, previous study difficult evaluation of aortic valve but appears to be some stenosis, we will reevaluate.  - ok to hold ACE-I today due to mild Cr elevation. COntinue IV diuresis, suspect Cr will trend down with diuresis.         Carlyle Dolly MD

## 2016-09-12 NOTE — Progress Notes (Signed)
Gave the patient Definity for Echocardiagram per physician order.

## 2016-09-12 NOTE — Progress Notes (Signed)
Nurse tech and nurse at bedside completing pt's admission. Pt states he had a bottle of Hydrocodone pills in his pockets down in the Emergency Room. Searched pt's belongings and there was no pill bottle containing Hydrocodone pills only a pill bottle of Nitroglycerin. Pt requested nurse call ED, because he believed he left it in the ED. Emergency Department nurse stated pt's wife took all of his belongings. AC notified.

## 2016-09-12 NOTE — Progress Notes (Signed)
PROGRESS NOTE                                                                                                                                                                                                             Patient Demographics:    [name] [name], is a 66 y.o. male, DOB - 1950-10-03, VFI:[id number]  Admit date - 09/11/2016   Admitting Physician Karmen Bongo, MD  Outpatient Primary MD for the patient is Sallee Lange, MD  LOS - 1  Chief Complaint  Patient presents with  . Shortness of Breath       Brief Narrative  [name] [name] is a 66 y.o. male with medical history significant of morbid obesity, OSA (not yet on CPAP), HLD, HTN, diabetes, CAD s/p 6v CABG in 2001 and subsequent PCI, AICD, and CHF (05/08/16 Echo with EF 15% and grade 3 diastolic dysfunction), Was admitted for worsening lower extremity edema, shortness of breath consistent with CHF despite taking increased dose Lasix.   Subjective:    [name] [name] today has, No headache, No chest pain, No abdominal pain - No Nausea, No new weakness tingling or numbness, No Cough - SOB. Continues to have swelling in both legs.   Assessment  & Plan :     1. 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 Placed on IV Lasix along with Zaroxolyn, placed on salt and fluid restriction, TED stockings, is on low-dose Coreg, since creatinine is slightly elevated will skip today's ACE inhibitor dose. Monitor intake and output, daily weight, cardiology consulted. Repeat echo ordered on admission pending.  2. ARF. Due to CHF decompensation, currently clearly fluid overloaded and requires diuresis, skip today's ACE inhibitor. Repeat BMP.  3. Dyslipidemia. I'll start and continue.  4. CAD. Chest pain-free. Continue aspirin, Plavix, statin, added low-dose Coreg. Monitor.    Family Communication  :  None  Code Status :  Full  Diet : Diet Heart Room service  appropriate? Yes; Fluid consistency: Thin; Fluid restriction: 1500 mL Fluid   Disposition Plan  :  TBD  Consults  :  Cards  Procedures  :     TTE -   DVT Prophylaxis  :  Lovenox   Lab Results  Component Value Date   PLT 261 09/12/2016    Inpatient Medications  Scheduled Meds: . aspirin  325 mg Oral Daily  . clopidogrel  75 mg Oral Daily  . enoxaparin (LOVENOX) injection  70 mg Subcutaneous Q24H  . furosemide  40 mg Intravenous BID  . [START ON 09/13/2016] lisinopril  2.5 mg Oral Daily  . metolazone  2.5 mg Oral Once  . rosuvastatin  40 mg Oral q1800   Continuous Infusions: PRN Meds:.acetaminophen, HYDROcodone-acetaminophen, ondansetron (ZOFRAN) IV  Antibiotics  :    Anti-infectives    None         Objective:   Vitals:   09/11/16 1900 09/11/16 2000 09/11/16 2030 09/11/16 2200  BP: 130/99 110/84 122/82   Pulse: 100 96 101   Resp: 17 (!) 27 22   Temp:      SpO2: 94% 95% 97%   Weight:    (!) 141.1 kg (311 lb)  Height:    5\' 10"  (1.778 m)    Wt Readings from Last 3 Encounters:  09/11/16 (!) 141.1 kg (311 lb)  09/11/16 (!) 143.8 kg (317 lb)  08/25/16 (!) 137.3 kg (302 lb 9.6 oz)     Intake/Output Summary (Last 24 hours) at 09/12/16 0900 Last data filed at 09/12/16 0440  Gross per 24 hour  Intake              480 ml  Output             1150 ml  Net             -670 ml     Physical Exam  Awake Alert, Oriented X 3, No new F.N deficits, Normal affect [place].AT,PERRAL Supple Neck,No JVD, No cervical lymphadenopathy appriciated.  Symmetrical Chest wall movement, Good air movement bilaterally, few rales RRR,No Gallops,Rubs or new Murmurs, No Parasternal Heave +ve B.Sounds, Abd Soft, No tenderness, No organomegaly appriciated, No rebound - guarding or rigidity. No Cyanosis, Clubbing , 2+ edema, No new Rash or bruise      Data Review:    CBC  Recent Labs Lab 09/11/16 1524 09/12/16 0320  WBC 11.6* 10.8*  HGB 15.5 14.1  HCT 47.5 42.9  PLT 256 261    MCV 95.0 94.5  MCH 31.0 31.1  MCHC 32.6 32.9  RDW 14.9 15.0  LYMPHSABS 1.3 1.2  MONOABS 1.2* 1.1*  EOSABS 0.1 0.1  BASOSABS 0.0 0.0    Chemistries   Recent Labs Lab 09/11/16 1524 09/12/16 0320  NA 138 140  K 3.3* 4.0  CL 96* 99*  CO2 33* 32  GLUCOSE 175* 187*  BUN 19 21*  CREATININE 1.08 1.27*  CALCIUM 9.6 8.8*   ------------------------------------------------------------------------------------------------------------------ No results for input(s): CHOL, HDL, LDLCALC, TRIG, CHOLHDL, LDLDIRECT in the last 72 hours.  Lab Results  Component Value Date   HGBA1C 6.4 (H) 07/11/2016   ------------------------------------------------------------------------------------------------------------------  Recent Labs  09/11/16 2153  TSH 3.363   ------------------------------------------------------------------------------------------------------------------ No results for input(s): VITAMINB12, FOLATE, FERRITIN, TIBC, IRON, RETICCTPCT in the last 72 hours.  Coagulation profile No results for input(s): INR, PROTIME in the last 168 hours.  No results for input(s): DDIMER in the last 72 hours.  Cardiac Enzymes  Recent Labs Lab 09/11/16 1524 09/11/16 2153 09/12/16 0320  TROPONINI 0.03* <0.03 <0.03   ------------------------------------------------------------------------------------------------------------------    Component Value Date/Time   BNP 733.0 (H) 09/11/2016 1524    Micro Results No results found for this or any previous visit (from the past 240 hour(s)).  Radiology Reports Dg Chest 2 View  Result Date: 09/11/2016 CLINICAL DATA:  Source of breath, CHF with chest pain  and weight gain 10 days. EXAM: CHEST  2 VIEW COMPARISON:  05/19/2016 FINDINGS: Sternotomy wires and left-sided pacemaker unchanged. Lordotic technique is demonstrated. Lungs are adequately inflated with findings suggesting small amount of bilateral pleural fluid on the lateral film. Cannot  exclude airspace opacification over the mid to lower lungs posteriorly on the lateral film which may be due to vascular congestion or infection. Stable cardiomegaly. Remainder of the exam is unchanged. IMPRESSION: Cannot exclude airspace opacification over the posterior mid to lower lungs on the lateral film which may be due to vascular congestion or infection. Small amount of bilateral pleural fluid posteriorly. Stable cardiomegaly. Electronically Signed   By: Marin Olp M.D.   On: 09/11/2016 16:31   Dg Abdomen 1 View  Result Date: 09/11/2016 CLINICAL DATA:  Dyspnea. EXAM: ABDOMEN - 1 VIEW COMPARISON:  None. FINDINGS: Bowel gas pattern is nonobstructive. 1 cm calcific density over the right upper quadrant as cannot exclude a renal or gallbladder stone. There are degenerative changes of the spine and mild degenerate changes of the hips. IMPRESSION: Nonobstructive bowel gas pattern. 1 cm calcific density over the right upper quadrant which may represent a gallstone or renal stone. Electronically Signed   By: Marin Olp M.D.   On: 09/11/2016 16:33    Time Spent in minutes  30   Bandon Sherwin K M.D on 09/12/2016 at 9:00 AM  Between 7am to 7pm - Pager - [phone]  After 7pm go to [email or web address] - password [facility]  [facility] -  Office  [phone]

## 2016-09-12 NOTE — Progress Notes (Addendum)
Inpatient Diabetes Program Recommendations  AACE/ADA: New Consensus Statement on Inpatient Glycemic Control (2015)  Target Ranges:  Prepandial:   less than 140 mg/dL      Peak postprandial:   less than 180 mg/dL (1-2 hours)      Critically ill patients:  140 - 180 mg/dL  Results for [name] [name], [name] [name] (MRN [id number]) as of 09/12/2016 09:52  Ref. Range 09/11/2016 15:24 09/12/2016 03:20  Glucose Latest Ref Range: 65 - 99 mg/dL 175 (H) 187 (H)   Results for [name] [name], [name] [name] (MRN [id number]) as of 09/12/2016 09:52  Ref. Range 06/05/2014 13:07 01/02/2015 10:10 07/05/2015 15:18 07/06/2015 09:37 07/11/2016 10:43  Hemoglobin A1C Latest Ref Range: 4.8 - 5.6 % 6.2 (H) 6.3 (H) 5.9 6.2 (H) 6.4 (H)    Review of Glycemic Control  Diabetes history: Prediabetes (per H&P) Outpatient Diabetes medications: None Current orders for Inpatient glycemic control: None  Inpatient Diabetes Program Recommendations: Correction (SSI): While inpatient, please consider ordering CBGs with Novolog correction scale ACHS. HgbA1C: A1C 6.4% on 07/11/16.  Thanks, Barnie Alderman, RN, MSN, CDE Diabetes Coordinator Inpatient Diabetes Program [phone] (Team Pager from 8am to 5pm)

## 2016-09-12 NOTE — Evaluation (Signed)
Occupational Therapy Evaluation Patient Details Name: [name] [name] MRN: [id number] DOB: 12-Mar-1951 Today's Date: 09/12/2016    History of Present Illness [name] [name] is a 66 y.o. male with medical history significant of morbid obesity, OSA (not yet on CPAP), HLD, HTN, diabetes, CAD s/p 6v CABG in 2001 and subsequent PCI, AICD, and CHF (05/08/16 Echo with EF 15% and grade 3 diastolic dysfunction) presenting because "furosemide ain't working".  He notes that he started with a sinus infection several weeks ago (there is no office note from his PCP office about this).  Just about the time it started getting better, over the last 1 1/2 weeks he has noticed increasing weight (47 pounds), SOB.  Currently weighs the most he has ever weighed - 314 in his "briefs."  No unusual cough.  Having to sleep in a recliner since the onset of symptoms.  Gets dry overnight and drinks water.  Has not peed much since Lasix and suddenly needs to go.  He reports that he has been unable to successfully void more than small amounts since this started, despite escalating his dose of Lasix to 120 mg PO TID.   Clinical Impression   Pt awake, alert, oriented x4 this am, wife present at end of evaluation. Pt reports independence at baseline PTA, wife confirms. Pt reports only difficulty is pain signaling need to urinate, and has immediate urge to urinate upon standing. Pt wife available to assist with ADL tasks if necessary, no further OT services required at this time.     Follow Up Recommendations  No OT follow up    Equipment Recommendations  None recommended by OT       Precautions / Restrictions Precautions Precautions: None Restrictions Weight Bearing Restrictions: No      Mobility Bed Mobility Overal bed mobility: Modified Independent                Transfers Overall transfer level: Modified independent                         ADL Overall ADL's : Modified independent;At baseline                                             Vision Vision Assessment?: No apparent visual deficits          Pertinent Vitals/Pain Pain Assessment: No/denies pain     Hand Dominance Right   Extremity/Trunk Assessment Upper Extremity Assessment Upper Extremity Assessment: Overall WFL for tasks assessed   Lower Extremity Assessment Lower Extremity Assessment: Defer to PT evaluation   Cervical / Trunk Assessment Cervical / Trunk Assessment: Normal   Communication Communication Communication: No difficulties   Cognition Arousal/Alertness: Awake/alert Behavior During Therapy: WFL for tasks assessed/performed Overall Cognitive Status: Within Functional Limits for tasks assessed                                Home Living Family/patient expects to be discharged to:: Private residence Living Arrangements: Spouse/significant other Available Help at Discharge: Family;Available 24 hours/day Type of Home: House             Bathroom Shower/Tub: Teacher, early years/pre: Standard     Home Equipment: None          Prior Functioning/Environment  Level of Independence: Independent                 OT Problem List: Cardiopulmonary status limiting activity    End of Session    Activity Tolerance: Patient tolerated treatment well Patient left: in bed;with call bell/phone within reach;with family/visitor present (MD in room)   Time: 7494-4967 OT Time Calculation (min): 22 min Charges:  OT General Charges $OT Visit: 1 Procedure OT Evaluation $OT Eval Low Complexity: 1 Procedure  Guadelupe Sabin, OTR/L  [phone] 09/12/2016, 10:59 AM

## 2016-09-12 NOTE — Progress Notes (Signed)
*  PRELIMINARY RESULTS* Echocardiogram 2D Echocardiogram with definity has been performed.  Leavy Cella 09/12/2016, 4:22 PM

## 2016-09-12 NOTE — Evaluation (Signed)
Physical Therapy Evaluation Patient Details Name: [name] [name] MRN: [id number] DOB: 09-Oct-1950 Today's Date: 09/12/2016   History of Present Illness  [name] [name] is a 66 y.o. male with medical history significant of morbid obesity, OSA (not yet on CPAP), HLD, HTN, diabetes, CAD s/p 6v CABG in 2001 and subsequent PCI, AICD, and CHF (05/08/16 Echo with EF 15% and grade 3 diastolic dysfunction) presenting because "furosemide ain't working".  He notes that he started with a sinus infection several weeks ago (there is no office note from his PCP office about this).  Just about the time it started getting better, over the last 1 1/2 weeks he has noticed increasing weight (47 pounds), SOB.  Currently weighs the most he has ever weighed - 314 in his "briefs."  No unusual cough.  Having to sleep in a recliner since the onset of symptoms.  Gets dry overnight and drinks water.  Has not peed much since Lasix and suddenly needs to go.  He reports that he has been unable to successfully void more than small amounts since this started, despite escalating his dose of Lasix to 120 mg PO TID  Clinical Impression  Pt is mod I in bed mobility and ambulation.  He is not in need of skilled physical therapy at this time.     Follow Up Recommendations No PT follow up    Equipment Recommendations  None recommended by PT    Recommendations for Other Services       Precautions / Restrictions Precautions Precautions: None Restrictions Weight Bearing Restrictions: No      Mobility  Bed Mobility Overal bed mobility: Modified Independent                Transfers Overall transfer level: Modified independent                  Ambulation/Gait Ambulation/Gait assistance: Modified independent (Device/Increase time) Ambulation Distance (Feet): 140 Feet Assistive device: None       General Gait Details: Pt needed to take three short rest breaks (15 seconds or less) during ambulation.    Stairs            Wheelchair Mobility    Modified Rankin (Stroke Patients Only)       Balance                                             Pertinent Vitals/Pain  0/10    Home Living Family/patient expects to be discharged to:: Private residence Living Arrangements: Spouse/significant other Available Help at Discharge: Friend(s) Type of Home: House       Home Layout: One level        Prior Function Level of Independence: Independent               Hand Dominance        Extremity/Trunk Assessment      WFL          Communication   Communication: No difficulties  Cognition Arousal/Alertness: Awake/alert Behavior During Therapy: WFL for tasks assessed/performed Overall Cognitive Status: Within Functional Limits for tasks assessed                      General Comments      Exercises General Exercises - Lower Extremity Ankle Circles/Pumps: Both;10 reps Quad Sets: Both;10 reps Gluteal Sets: Both;10 reps  Heel Slides: Both;10 reps Straight Leg Raises: Both;5 reps   Assessment/Plan    PT Assessment Patent does not need any further PT services  PT Problem List            PT Treatment Interventions      PT Goals (Current goals can be found in the Care Plan section)  Acute Rehab PT Goals Patient Stated Goal: To go home PT Goal Formulation: With patient Potential to Achieve Goals: Good    Frequency     Barriers to discharge        Co-evaluation               End of Session Equipment Utilized During Treatment: Gait belt Activity Tolerance: Patient tolerated treatment well Patient left: in bed;with call bell/phone within reach;with family/visitor present           Time: 9191-6606 PT Time Calculation (min) (ACUTE ONLY): 27 min   Charges:   PT Evaluation $PT Eval Low Complexity: 1 Procedure     PT G CodesRayetta Humphrey, PT CLT [phone] 09/12/2016, 4:13 PM

## 2016-09-13 LAB — BASIC METABOLIC PANEL
Anion gap: 10 (ref 5–15)
BUN: 24 mg/dL — AB (ref 6–20)
CO2: 32 mmol/L (ref 22–32)
CREATININE: 1.19 mg/dL (ref 0.61–1.24)
Calcium: 8.2 mg/dL — ABNORMAL LOW (ref 8.9–10.3)
Chloride: 93 mmol/L — ABNORMAL LOW (ref 101–111)
GFR calc Af Amer: >60 mL/min (ref >60)
Glucose, Bld: 175 mg/dL — ABNORMAL HIGH (ref 65–99)
POTASSIUM: 3.2 mmol/L — AB (ref 3.5–5.1)
SODIUM: 135 mmol/L (ref 135–145)

## 2016-09-13 LAB — MAGNESIUM: MAGNESIUM: 2 mg/dL (ref 1.7–2.4)

## 2016-09-13 LAB — URINALYSIS, MICROSCOPIC (REFLEX)
Bacteria, UA: NONE SEEN
RBC / HPF: NONE SEEN RBC/hpf (ref 0–5)
Squamous Epithelial / LPF: NONE SEEN
WBC, UA: NONE SEEN WBC/hpf (ref 0–5)

## 2016-09-13 LAB — URINALYSIS, ROUTINE W REFLEX MICROSCOPIC
Bilirubin Urine: NEGATIVE
GLUCOSE, UA: NEGATIVE mg/dL
HGB URINE DIPSTICK: NEGATIVE
KETONES UR: NEGATIVE mg/dL
LEUKOCYTES UA: NEGATIVE
Nitrite: NEGATIVE
Specific Gravity, Urine: 1.025 (ref 1.005–1.030)
pH: 6 (ref 5.0–8.0)

## 2016-09-13 MED ORDER — POTASSIUM CHLORIDE CRYS ER 20 MEQ PO TBCR
40.0000 meq | EXTENDED_RELEASE_TABLET | Freq: Two times a day (BID) | ORAL | Status: DC
  Administered 2016-09-13 (×2): 40 meq via ORAL
  Filled 2016-09-13 (×2): qty 2

## 2016-09-13 MED ORDER — FUROSEMIDE 10 MG/ML IJ SOLN
60.0000 mg | Freq: Two times a day (BID) | INTRAMUSCULAR | Status: DC
  Administered 2016-09-13 – 2016-09-15 (×5): 60 mg via INTRAVENOUS
  Filled 2016-09-13 (×7): qty 6

## 2016-09-13 MED ORDER — POTASSIUM CHLORIDE 10 MEQ/100ML IV SOLN
10.0000 meq | INTRAVENOUS | Status: AC
  Administered 2016-09-13 (×2): 10 meq via INTRAVENOUS
  Filled 2016-09-13: qty 100

## 2016-09-13 MED ORDER — NITROGLYCERIN 0.4 MG SL SUBL
SUBLINGUAL_TABLET | SUBLINGUAL | Status: AC
  Filled 2016-09-13: qty 1

## 2016-09-13 MED ORDER — FUROSEMIDE 10 MG/ML IJ SOLN
20.0000 mg | Freq: Once | INTRAMUSCULAR | Status: AC
  Administered 2016-09-13: 20 mg via INTRAVENOUS
  Filled 2016-09-13: qty 2

## 2016-09-13 MED ORDER — POLYETHYLENE GLYCOL 3350 17 G PO PACK
17.0000 g | PACK | Freq: Every day | ORAL | Status: DC
  Administered 2016-09-13 – 2016-09-15 (×3): 17 g via ORAL
  Filled 2016-09-13 (×5): qty 1

## 2016-09-13 NOTE — Care Management Note (Signed)
Case Management Note  Patient Details  Name: [name] [name] MRN: [id number] Date of Birth: [birth year]  Subjective/Objective:   Patient adm from home. Ind with ADL's, still drives. No DME PTA. He has PCP, transportation and reports no issues affording medications. He is currently on 3L of oxygen, no oxygen at home. States he has never needed it prior to discharge. PT recommends no PT follow up.  Action/Plan: Anticipate DC home with self care. Will need to be weaned off oxygen.    Expected Discharge Date:       09/14/2016           Expected Discharge Plan:  Home/Self Care  In-House Referral:  NA  Discharge planning Services  CM Consult  Post Acute Care Choice:  NA Choice offered to:  NA  DME Arranged:    DME Agency:     HH Arranged:    HH Agency:     Status of Service:  In process, will continue to follow  If discussed at Long Length of Stay Meetings, dates discussed:    Additional Comments:  [name] [name], Chauncey Reading, RN 09/13/2016, 11:31 AM

## 2016-09-13 NOTE — Progress Notes (Signed)
Subjective:    SOB improving,.   Objective:   Temp:  [97.4 F (36.3 C)-97.7 F (36.5 C)] 97.4 F (36.3 C) (01/17 0507) Pulse Rate:  [75-93] 75 (01/17 0743) Resp:  [20] 20 (01/17 0507) BP: (113-121)/(68-85) 113/75 (01/17 0743) SpO2:  [97 %-100 %] 100 % (01/17 0906) Weight:  [300 lb 4.3 oz (136.2 kg)] 300 lb 4.3 oz (136.2 kg) (01/17 0507) Last BM Date: 09/12/16  Filed Weights   09/11/16 1448 09/11/16 2200 09/13/16 0507  Weight: (!) 314 lb (142.4 kg) (!) 311 lb (141.1 kg) (!) 300 lb 4.3 oz (136.2 kg)    Intake/Output Summary (Last 24 hours) at 09/13/16 1021 Last data filed at 09/13/16 0421  Gross per 24 hour  Intake              380 ml  Output             1641 ml  Net            -1261 ml    Telemetry:SR, PSVT  Exam:  General: NAD  HEENT: sclera clear, throat clear  Resp: CTAB  Cardiac: RRR, no m/r/g, no jvd  GI: abdomen soft, NT, ND  MSK: 2+ bilateral LE edema  Neuro:  No focal deficits  Psych: appropriate affect  Lab Results:  Basic Metabolic Panel:  Recent Labs Lab 09/11/16 1524 09/12/16 0320 09/13/16 0452  NA 138 140 135  K 3.3* 4.0 3.2*  CL 96* 99* 93*  CO2 33* 32 32  GLUCOSE 175* 187* 175*  BUN 19 21* 24*  CREATININE 1.08 1.27* 1.19  CALCIUM 9.6 8.8* 8.2*  MG  --   --  2.0    Liver Function Tests: No results for input(s): AST, ALT, ALKPHOS, BILITOT, PROT, ALBUMIN in the last 168 hours.  CBC:  Recent Labs Lab 09/11/16 1524 09/12/16 0320  WBC 11.6* 10.8*  HGB 15.5 14.1  HCT 47.5 42.9  MCV 95.0 94.5  PLT 256 261    Cardiac Enzymes:  Recent Labs Lab 09/11/16 2153 09/12/16 0320 09/12/16 0905  TROPONINI <0.03 <0.03 <0.03    BNP: No results for input(s): PROBNP in the last 8760 hours.  Coagulation: No results for input(s): INR in the last 168 hours.  ECG:   Medications:   Scheduled Medications: . aspirin  325 mg Oral Daily  . carvedilol  3.125 mg Oral BID WC  . clopidogrel  75 mg Oral Daily  . enoxaparin  (LOVENOX) injection  70 mg Subcutaneous Q24H  . furosemide  40 mg Intravenous BID  . lisinopril  2.5 mg Oral Daily  . potassium chloride  40 mEq Oral BID  . rosuvastatin  40 mg Oral q1800     Infusions:   PRN Medications:  acetaminophen, bisacodyl, HYDROcodone-acetaminophen, ondansetron (ZOFRAN) IV     Assessment/Plan     1. Acute on chronic systolic HF/ICM Patient presents with acute on chronic systolic HF. By charting his weight is up about 15 lbs from his baseline of 302 lbs. Negative 1L yesterday and 1.6 liters since admission. He is on lasix IV 40mg  bid, fairly stable renal function. Increase lasix to 60mg  IV bid today. Weights appear inaccurate.  - ACE-I held due to initial bump in Cr, we will continue to hold at this time.  - repeat echo stable LVEF 10-15%, moderate AS.        Carlyle Dolly, M.D., F.A.C.C.Patient ID: [name] [name], male   DOB: 1951/04/27, 66 y.o.   MRN: [id number]

## 2016-09-13 NOTE — Progress Notes (Signed)
Patient reports urine smelling strong/foul and darker in color.  Dr. Carolin Sicks notified and gave order for urinalysis and culture.

## 2016-09-13 NOTE — Progress Notes (Signed)
Patient high fall risk but refuses to keep bed alarm.  Patient educated to call before getting out of bed.  Non-skid socks in place.  Call bed within reach.

## 2016-09-13 NOTE — Progress Notes (Signed)
Patient requesting Milk of Mag and Miralax for constipation.  Dr. Carolin Sicks notified via text page.

## 2016-09-13 NOTE — Progress Notes (Signed)
PROGRESS NOTE    [name] [name]  XVQ:[id number] DOB: 1950/10/24 DOA: 09/11/2016 PCP: Sallee Lange, MD   Brief Narrative: 66 y.o.malewith medical history significant of morbid obesity, OSA (not yet on CPAP), HLD, HTN, diabetes, CAD s/p 6v CABG in 2001 and subsequent PCI, AICD, and CHF (05/08/16 Echo with EF 15% and grade 3 diastolic dysfunction), admitted for worsening lower extremity edema, shortness of breath consistent with CHF despite taking increased dose Lasix.  Assessment & Plan:  #Acute on chronic combined systolic and diastolic congestive heart failure: EF of 15%. -Patient is up about 15 pounds from his baseline. On IV Lasix with negative for about 2 L. Increased the dose of Lasix to 60 mg twice a day. Not on Zaroxolyn. Monitor urine output, low salt diet. -ACEI on hold because of mild elevation in creatinine. Continue to hold. -Repeat echocardiogram with EF of 10-15% and moderate AS. -Cardiology consult appreciated. Discussed with Dr. Harl Bowie.  #Acute kidney injury likely hemodynamically mediated in the setting of diuretics and ACEI. Serum creatinine level better today. Continue to monitor. Avoid productions.  #Hypokalemia in the setting of diuretics: Started potassium chloride oral supplement twice a day. Magnesium level acceptable.  #Dyslipidemia: Crestor  #Coronary artery disease: No chest pain. Currently on aspirin Plavix, Coreg and diuretics.  Principal Problem:   Acute on chronic congestive heart failure (HCC) Active Problems:   Hyperlipemia   Morbid obesity (HCC)   Obstructive sleep apnea   Diabetes mellitus, type 2 (HCC)   Essential hypertension   Prolonged Q-T interval on ECG   Hypokalemia   DVT prophylaxis: Lovenox subcutaneous Code Status: Partial DO NOT RESUSCITATE Family Communication: No family present at bedside Disposition Plan: Likely discharge home in 2-3 days. Still diuresis more with IV diuretics.   Consultants:   Cardiologist  Procedures:  Echocardiogram Antimicrobials: None  Subjective: Patient was seen and examined at bedside. Shortness of breath is better but reported not increased urine output. Denied chest pain, nausea, vomiting, abdominal pain, dizziness or lightheadedness.  Objective: Vitals:   09/12/16 2117 09/13/16 0507 09/13/16 0743 09/13/16 0906  BP: 121/85 118/68 113/75   Pulse: 89 93 75   Resp: 20 20    Temp: 97.7 F (36.5 C) 97.4 F (36.3 C)    TempSrc: Oral Axillary    SpO2: 99% 97%  100%  Weight:  (!) 136.2 kg (300 lb 4.3 oz)    Height:        Intake/Output Summary (Last 24 hours) at 09/13/16 1335 Last data filed at 09/13/16 1235  Gross per 24 hour  Intake              380 ml  Output             1880 ml  Net            -1500 ml   Filed Weights   09/11/16 1448 09/11/16 2200 09/13/16 0507  Weight: (!) 142.4 kg (314 lb) (!) 141.1 kg (311 lb) (!) 136.2 kg (300 lb 4.3 oz)    Examination:  General exam: Appears calm and comfortable  Respiratory system: Bilateral basal decreased breath sound. Respiratory effort normal. No wheezing or crackle Cardiovascular system: S1 & S2 heard, RRR.  Gastrointestinal system: Abdomen is  soft and nontender. Normal bowel sounds heard. Central nervous system: Alert and oriented. No focal neurological deficits. Extremities: Symmetric 5 x 5 power. Bilateral lower extremities pitting edema. Skin: No rashes, lesions or ulcers Psychiatry: Judgement and insight appear normal. Mood & affect appropriate.  Data Reviewed: I have personally reviewed following labs and imaging studies  CBC:  Recent Labs Lab 09/11/16 1524 09/12/16 0320  WBC 11.6* 10.8*  NEUTROABS 9.0* 8.4*  HGB 15.5 14.1  HCT 47.5 42.9  MCV 95.0 94.5  PLT 256 355   Basic Metabolic Panel:  Recent Labs Lab 09/11/16 1524 09/12/16 0320 09/13/16 0452  NA 138 140 135  K 3.3* 4.0 3.2*  CL 96* 99* 93*  CO2 33* 32 32  GLUCOSE 175* 187* 175*  BUN 19 21* 24*  CREATININE 1.08 1.27* 1.19    CALCIUM 9.6 8.8* 8.2*  MG  --   --  2.0   GFR: Estimated Creatinine Clearance: 86 mL/min (by C-G formula based on SCr of 1.19 mg/dL). Liver Function Tests: No results for input(s): AST, ALT, ALKPHOS, BILITOT, PROT, ALBUMIN in the last 168 hours. No results for input(s): LIPASE, AMYLASE in the last 168 hours. No results for input(s): AMMONIA in the last 168 hours. Coagulation Profile: No results for input(s): INR, PROTIME in the last 168 hours. Cardiac Enzymes:  Recent Labs Lab 09/11/16 1524 09/11/16 2153 09/12/16 0320 09/12/16 0905  TROPONINI 0.03* <0.03 <0.03 <0.03   BNP (last 3 results) No results for input(s): PROBNP in the last 8760 hours. HbA1C: No results for input(s): HGBA1C in the last 72 hours. CBG:  Recent Labs Lab 09/12/16 0731  GLUCAP 160*   Lipid Profile: No results for input(s): CHOL, HDL, LDLCALC, TRIG, CHOLHDL, LDLDIRECT in the last 72 hours. Thyroid Function Tests:  Recent Labs  09/11/16 2153  TSH 3.363   Anemia Panel: No results for input(s): VITAMINB12, FOLATE, FERRITIN, TIBC, IRON, RETICCTPCT in the last 72 hours. Sepsis Labs: No results for input(s): PROCALCITON, LATICACIDVEN in the last 168 hours.  No results found for this or any previous visit (from the past 240 hour(s)).       Radiology Studies: Dg Chest 2 View  Result Date: 09/11/2016 CLINICAL DATA:  Source of breath, CHF with chest pain and weight gain 10 days. EXAM: CHEST  2 VIEW COMPARISON:  05/19/2016 FINDINGS: Sternotomy wires and left-sided pacemaker unchanged. Lordotic technique is demonstrated. Lungs are adequately inflated with findings suggesting small amount of bilateral pleural fluid on the lateral film. Cannot exclude airspace opacification over the mid to lower lungs posteriorly on the lateral film which may be due to vascular congestion or infection. Stable cardiomegaly. Remainder of the exam is unchanged. IMPRESSION: Cannot exclude airspace opacification over the  posterior mid to lower lungs on the lateral film which may be due to vascular congestion or infection. Small amount of bilateral pleural fluid posteriorly. Stable cardiomegaly. Electronically Signed   By: Marin Olp M.D.   On: 09/11/2016 16:31   Dg Abdomen 1 View  Result Date: 09/11/2016 CLINICAL DATA:  Dyspnea. EXAM: ABDOMEN - 1 VIEW COMPARISON:  None. FINDINGS: Bowel gas pattern is nonobstructive. 1 cm calcific density over the right upper quadrant as cannot exclude a renal or gallbladder stone. There are degenerative changes of the spine and mild degenerate changes of the hips. IMPRESSION: Nonobstructive bowel gas pattern. 1 cm calcific density over the right upper quadrant which may represent a gallstone or renal stone. Electronically Signed   By: Marin Olp M.D.   On: 09/11/2016 16:33        Scheduled Meds: . aspirin  325 mg Oral Daily  . carvedilol  3.125 mg Oral BID WC  . clopidogrel  75 mg Oral Daily  . enoxaparin (LOVENOX) injection  70 mg Subcutaneous  Q24H  . furosemide  60 mg Intravenous BID  . potassium chloride  40 mEq Oral BID  . rosuvastatin  40 mg Oral q1800   Continuous Infusions:   LOS: 2 days    Hollee Fate Tanna Furry, MD [facility] Pager [phone]  If 7PM-7AM, please contact night-coverage [email or web address] Password [email or web address] 09/13/2016, 1:35 PM

## 2016-09-14 ENCOUNTER — Inpatient Hospital Stay (HOSPITAL_COMMUNITY): Payer: Medicare Other

## 2016-09-14 DIAGNOSIS — K5900 Constipation, unspecified: Secondary | ICD-10-CM

## 2016-09-14 DIAGNOSIS — R601 Generalized edema: Secondary | ICD-10-CM

## 2016-09-14 LAB — SODIUM, URINE, RANDOM: SODIUM UR: 50 mmol/L

## 2016-09-14 LAB — URINE CULTURE: Culture: NO GROWTH

## 2016-09-14 LAB — BASIC METABOLIC PANEL
Anion gap: 11 (ref 5–15)
BUN: 30 mg/dL — ABNORMAL HIGH (ref 6–20)
CHLORIDE: 92 mmol/L — AB (ref 101–111)
CO2: 32 mmol/L (ref 22–32)
CREATININE: 1.35 mg/dL — AB (ref 0.61–1.24)
Calcium: 7.9 mg/dL — ABNORMAL LOW (ref 8.9–10.3)
GFR calc non Af Amer: 54 mL/min — ABNORMAL LOW (ref >60)
Glucose, Bld: 155 mg/dL — ABNORMAL HIGH (ref 65–99)
POTASSIUM: 3.2 mmol/L — AB (ref 3.5–5.1)
Sodium: 135 mmol/L (ref 135–145)

## 2016-09-14 LAB — MAGNESIUM: MAGNESIUM: 2.3 mg/dL (ref 1.7–2.4)

## 2016-09-14 MED ORDER — LIDOCAINE 4 % EX CREA
TOPICAL_CREAM | Freq: Every day | CUTANEOUS | Status: DC | PRN
  Filled 2016-09-14: qty 5

## 2016-09-14 MED ORDER — LIDOCAINE HCL 2 % EX GEL
1.0000 "application " | Freq: Once | CUTANEOUS | Status: AC
  Administered 2016-09-14: 1 via TOPICAL
  Filled 2016-09-14: qty 5

## 2016-09-14 MED ORDER — POTASSIUM CHLORIDE CRYS ER 20 MEQ PO TBCR
40.0000 meq | EXTENDED_RELEASE_TABLET | Freq: Three times a day (TID) | ORAL | Status: DC
  Administered 2016-09-14 (×3): 40 meq via ORAL
  Filled 2016-09-14 (×4): qty 2

## 2016-09-14 MED ORDER — LACTULOSE 10 GM/15ML PO SOLN
30.0000 g | Freq: Two times a day (BID) | ORAL | Status: AC
  Administered 2016-09-14 (×2): 30 g via ORAL
  Filled 2016-09-14 (×3): qty 60

## 2016-09-14 MED ORDER — LIDOCAINE HCL 2 % EX GEL
CUTANEOUS | Status: AC
  Filled 2016-09-14: qty 30

## 2016-09-14 MED ORDER — FLUTICASONE PROPIONATE 50 MCG/ACT NA SUSP
2.0000 | Freq: Every day | NASAL | Status: DC
Start: 2016-09-14 — End: 2016-09-18
  Administered 2016-09-14 – 2016-09-18 (×5): 2 via NASAL
  Filled 2016-09-14 (×2): qty 16

## 2016-09-14 NOTE — Progress Notes (Signed)
RN received a call from central telemetry.  Patient had 31 beats of VTach while sitting on the toilet having a bowel movement. Dr. Carolin Sicks notified via text page and stated to notify Dr. Harl Bowie.

## 2016-09-14 NOTE — Progress Notes (Signed)
RN received call from central telemetry.  Patient had 5 beats of VTach.  VS obtained.  Patient denies pain, shortness of breath, dizziness.  Dr. Carolin Sicks and Dr. Harl Bowie on unit and notified.  No new orders.

## 2016-09-14 NOTE — Progress Notes (Signed)
Patient c/o constipation. Miralax had been ordered but paitent refused dose. Miralax given with night time medications. Dulcolax suppository given per rectum. Patient had no results from Miralax or Dulcolax. MD paged and orders given for Soap Suds Enema. Soap Suds Enema given per rectum. Pt has very small amount of hard stool returned, still c/o pain in recutm. MD paged and instructed to give pain medication.

## 2016-09-14 NOTE — Progress Notes (Signed)
Subjective:    SOB unchanged  Objective:   Temp:  [97.7 F (36.5 C)-98 F (36.7 C)] 97.9 F (36.6 C) (01/18 0500) Pulse Rate:  [79-86] 86 (01/18 0900) Resp:  [16-18] 18 (01/18 0500) BP: (91-148)/(68-135) 115/79 (01/18 0900) SpO2:  [97 %-99 %] 97 % (01/18 0900) Weight:  [310 lb 10.1 oz (140.9 kg)] 310 lb 10.1 oz (140.9 kg) (01/18 0500) Last BM Date: 09/12/16  Filed Weights   09/11/16 2200 09/13/16 0507 09/14/16 0500  Weight: (!) 311 lb (141.1 kg) (!) 300 lb 4.3 oz (136.2 kg) (!) 310 lb 10.1 oz (140.9 kg)    Intake/Output Summary (Last 24 hours) at 09/14/16 1011 Last data filed at 09/14/16 0500  Gross per 24 hour  Intake              120 ml  Output             1275 ml  Net            -1155 ml    Telemetry: SR  Exam:  General: NAD  HEENT: sclera clear, throat clear  Resp: mild crackles bilateral bases  Cardiac: RRR, no m/r/g, no jvd  GI: abdomen soft, NT, ND  MSK:1+ bilateral LE edema  Neuro: no focal deficits  Psych: appropriate affect  Lab Results:  Basic Metabolic Panel:  Recent Labs Lab 09/12/16 0320 09/13/16 0452 09/14/16 0513  NA 140 135 135  K 4.0 3.2* 3.2*  CL 99* 93* 92*  CO2 32 32 32  GLUCOSE 187* 175* 155*  BUN 21* 24* 30*  CREATININE 1.27* 1.19 1.35*  CALCIUM 8.8* 8.2* 7.9*  MG  --  2.0  --     Liver Function Tests: No results for input(s): AST, ALT, ALKPHOS, BILITOT, PROT, ALBUMIN in the last 168 hours.  CBC:  Recent Labs Lab 09/11/16 1524 09/12/16 0320  WBC 11.6* 10.8*  HGB 15.5 14.1  HCT 47.5 42.9  MCV 95.0 94.5  PLT 256 261    Cardiac Enzymes:  Recent Labs Lab 09/11/16 2153 09/12/16 0320 09/12/16 0905  TROPONINI <0.03 <0.03 <0.03    BNP: No results for input(s): PROBNP in the last 8760 hours.  Coagulation: No results for input(s): INR in the last 168 hours.  ECG:   Medications:   Scheduled Medications: . aspirin  325 mg Oral Daily  . carvedilol  3.125 mg Oral BID WC  . clopidogrel  75 mg  Oral Daily  . enoxaparin (LOVENOX) injection  70 mg Subcutaneous Q24H  . furosemide  60 mg Intravenous BID  . lactulose  30 g Oral BID  . polyethylene glycol  17 g Oral Daily  . potassium chloride  40 mEq Oral TID  . rosuvastatin  40 mg Oral q1800     Infusions:   PRN Medications:  acetaminophen, bisacodyl, HYDROcodone-acetaminophen, ondansetron (ZOFRAN) IV     Assessment/Plan   1. Acute on chronic systolic HF/ICM Patient presents with acute on chronic systolic HF. By charting his weight is up about 15 lbs from his baseline of 302 lbs. Negative 1L yesterday and 2.7 liters since admission. He is on lasix IV 60mg  bid, somewhat labile renal fnction with mild uptrend in Cr and BUN today. Continue IV lasix today. Weights are inccurate.  - ACE-I held due to initial bump in Cr, we will continue to hold at this time.  - repeat echo stable LVEF 10-15%, moderate AS.         Carlyle Dolly, M.D.

## 2016-09-14 NOTE — Progress Notes (Addendum)
Dr. Harl Bowie notified of patient's VTach and VS.  Dr. Harl Bowie gave order to check Magnesium as add-on now, continue current Potassium, notify of patient's Magnesium level and continue to monitor.

## 2016-09-14 NOTE — Progress Notes (Signed)
PROGRESS NOTE    [name] [name]  QQV:[id number] DOB: 1951/06/05 DOA: 09/11/2016 PCP: Sallee Lange, MD   Brief Narrative: 66 y.o.malewith medical history significant of morbid obesity, OSA (not yet on CPAP), HLD, HTN, diabetes, CAD s/p 6v CABG in 2001 and subsequent PCI, AICD, and CHF (05/08/16 Echo with EF 15% and grade 3 diastolic dysfunction), admitted for worsening lower extremity edema, shortness of breath consistent with CHF despite taking increased dose Lasix.  Assessment & Plan:  #Acute on chronic combined systolic and diastolic congestive heart failure: EF of 15%. -Patient is up about 15 pounds from his baseline. On IV Lasix with negative for about 2.7 L. patient has labile renal function with all trending creatinine today. He has anasarca therefore continue IV Lasix twice a day. Continue to monitor urine output, electrolytes. Continue low-salt diet. I -ACEI on hold because of mild elevation in creatinine. Continue to hold. -Repeat echocardiogram with EF of 10-15% and moderate AS. -Cardiology consult appreciated. Discussed with Dr. Harl Bowie today.  #Acute kidney injury likely hemodynamically mediated in the setting of diuretics and ACEI. Serum creatinine level better mildly elevated to 1.3 today. Continue to monitor. Avoid productions.  #Hypokalemia in the setting of diuretics: Increase potassium chloride 3 times a day. Monitor labs. Magnesium level acceptable.   #Dyslipidemia: Crestor  #Coronary artery disease: No chest pain. Currently on aspirin Plavix, Coreg and diuretics.  #Constipation. Patient reported very hard stool few days ago. No bowel movement now. Did not improve with MiraLAX, enema and Dulcolax. I will add lactulose today. Abdominal x-ray gabapentin today which showed no stool impaction. Encourage mobility. Continue stool softener and bowel regimen for now. Abdomen exam benign.  Principal Problem:   Acute on chronic congestive heart failure (HCC) Active Problems:  Hyperlipemia   Morbid obesity (HCC)   Obstructive sleep apnea   Diabetes mellitus, type 2 (HCC)   Essential hypertension   Prolonged Q-T interval on ECG   Hypokalemia   DVT prophylaxis: Lovenox subcutaneous Code Status: Partial DO NOT RESUSCITATE Family Communication: Patient's wife at bedside. Disposition Plan: Likely discharge home in 2-3 days. Still diuresis more with IV diuretics.   Consultants:   Cardiologist  Procedures: Echocardiogram Antimicrobials: None  Subjective: Patient was seen and examined at bedside. Shortness of breath is stable. Did not require oxygen in the daytime. Has constipation. Denied chest pain, nausea, vomiting, abdominal pain. No headache or dizziness.  Objective: Vitals:   09/13/16 1858 09/13/16 2123 09/14/16 0500 09/14/16 0900  BP: 107/75 116/89 120/90 115/79  Pulse: 81 85 79 86  Resp:  18 18   Temp:  98 F (36.7 C) 97.9 F (36.6 C)   TempSrc:  Oral Oral   SpO2:  98% 99% 97%  Weight:   (!) 140.9 kg (310 lb 10.1 oz)   Height:        Intake/Output Summary (Last 24 hours) at 09/14/16 1111 Last data filed at 09/14/16 1018  Gross per 24 hour  Intake              360 ml  Output             1700 ml  Net            -1340 ml   Filed Weights   09/11/16 2200 09/13/16 0507 09/14/16 0500  Weight: (!) 141.1 kg (311 lb) (!) 136.2 kg (300 lb 4.3 oz) (!) 140.9 kg (310 lb 10.1 oz)    Examination:  General exam: Appears calm and comfortable  Respiratory system:Bibasal  decreased breath sound and crackle, no wheezing Cardiovascular system: S1 & S2 heard, RRR.  Gastrointestinal system: Abdomen is soft, nontender, bowel sound positive. Central nervous system: Alert and oriented. No focal neurological deficits. Extremities: Symmetric 5 x 5 power. Bilateral lower extremities pitting edema. Skin: No rashes, lesions or ulcers Psychiatry: Judgement and insight appear normal. Mood & affect appropriate.     Data Reviewed: I have personally reviewed  following labs and imaging studies  CBC:  Recent Labs Lab 09/11/16 1524 09/12/16 0320  WBC 11.6* 10.8*  NEUTROABS 9.0* 8.4*  HGB 15.5 14.1  HCT 47.5 42.9  MCV 95.0 94.5  PLT 256 389   Basic Metabolic Panel:  Recent Labs Lab 09/11/16 1524 09/12/16 0320 09/13/16 0452 09/14/16 0513  NA 138 140 135 135  K 3.3* 4.0 3.2* 3.2*  CL 96* 99* 93* 92*  CO2 33* 32 32 32  GLUCOSE 175* 187* 175* 155*  BUN 19 21* 24* 30*  CREATININE 1.08 1.27* 1.19 1.35*  CALCIUM 9.6 8.8* 8.2* 7.9*  MG  --   --  2.0  --    GFR: Estimated Creatinine Clearance: 77.3 mL/min (by C-G formula based on SCr of 1.35 mg/dL (H)). Liver Function Tests: No results for input(s): AST, ALT, ALKPHOS, BILITOT, PROT, ALBUMIN in the last 168 hours. No results for input(s): LIPASE, AMYLASE in the last 168 hours. No results for input(s): AMMONIA in the last 168 hours. Coagulation Profile: No results for input(s): INR, PROTIME in the last 168 hours. Cardiac Enzymes:  Recent Labs Lab 09/11/16 1524 09/11/16 2153 09/12/16 0320 09/12/16 0905  TROPONINI 0.03* <0.03 <0.03 <0.03   BNP (last 3 results) No results for input(s): PROBNP in the last 8760 hours. HbA1C: No results for input(s): HGBA1C in the last 72 hours. CBG:  Recent Labs Lab 09/12/16 0731  GLUCAP 160*   Lipid Profile: No results for input(s): CHOL, HDL, LDLCALC, TRIG, CHOLHDL, LDLDIRECT in the last 72 hours. Thyroid Function Tests:  Recent Labs  09/11/16 2153  TSH 3.363   Anemia Panel: No results for input(s): VITAMINB12, FOLATE, FERRITIN, TIBC, IRON, RETICCTPCT in the last 72 hours. Sepsis Labs: No results for input(s): PROCALCITON, LATICACIDVEN in the last 168 hours.  No results found for this or any previous visit (from the past 240 hour(s)).       Radiology Studies: Dg Abd 2 Views  Result Date: 09/14/2016 CLINICAL DATA:  Constipation EXAM: ABDOMEN - 2 VIEW COMPARISON:  Three days ago FINDINGS: Normal bowel gas pattern. No  abnormal stool retention or rectal impaction. Stone over the right abdomen which could be renal, biliary, or other. Atherosclerotic calcification and spondylosis. Cardiomegaly.  Transvenous ICD. IMPRESSION: No abnormal stool retention or rectal impaction. Normal bowel gas pattern. Electronically Signed   By: Monte Fantasia M.D.   On: 09/14/2016 09:56        Scheduled Meds: . aspirin  325 mg Oral Daily  . carvedilol  3.125 mg Oral BID WC  . clopidogrel  75 mg Oral Daily  . enoxaparin (LOVENOX) injection  70 mg Subcutaneous Q24H  . fluticasone  2 spray Each Nare Daily  . furosemide  60 mg Intravenous BID  . lactulose  30 g Oral BID  . polyethylene glycol  17 g Oral Daily  . potassium chloride  40 mEq Oral TID  . rosuvastatin  40 mg Oral q1800   Continuous Infusions:   LOS: 3 days    Dron Tanna Furry, MD [facility] Pager [phone]  If 7PM-7AM, please  contact night-coverage [email or web address] Password [email or web address] 09/14/2016, 11:11 AM

## 2016-09-15 ENCOUNTER — Inpatient Hospital Stay (HOSPITAL_COMMUNITY): Payer: Medicare Other

## 2016-09-15 DIAGNOSIS — R109 Unspecified abdominal pain: Secondary | ICD-10-CM

## 2016-09-15 DIAGNOSIS — K5909 Other constipation: Secondary | ICD-10-CM

## 2016-09-15 DIAGNOSIS — I10 Essential (primary) hypertension: Secondary | ICD-10-CM

## 2016-09-15 DIAGNOSIS — E876 Hypokalemia: Secondary | ICD-10-CM

## 2016-09-15 DIAGNOSIS — G4733 Obstructive sleep apnea (adult) (pediatric): Secondary | ICD-10-CM

## 2016-09-15 DIAGNOSIS — E784 Other hyperlipidemia: Secondary | ICD-10-CM

## 2016-09-15 DIAGNOSIS — R601 Generalized edema: Secondary | ICD-10-CM

## 2016-09-15 DIAGNOSIS — R262 Difficulty in walking, not elsewhere classified: Secondary | ICD-10-CM

## 2016-09-15 DIAGNOSIS — I5043 Acute on chronic combined systolic (congestive) and diastolic (congestive) heart failure: Secondary | ICD-10-CM

## 2016-09-15 DIAGNOSIS — R1030 Lower abdominal pain, unspecified: Secondary | ICD-10-CM

## 2016-09-15 LAB — BASIC METABOLIC PANEL
Anion gap: 8 (ref 5–15)
BUN: 34 mg/dL — ABNORMAL HIGH (ref 6–20)
CHLORIDE: 93 mmol/L — AB (ref 101–111)
CO2: 32 mmol/L (ref 22–32)
CREATININE: 1.33 mg/dL — AB (ref 0.61–1.24)
Calcium: 8.4 mg/dL — ABNORMAL LOW (ref 8.9–10.3)
GFR calc non Af Amer: 55 mL/min — ABNORMAL LOW (ref >60)
Glucose, Bld: 181 mg/dL — ABNORMAL HIGH (ref 65–99)
Potassium: 4.4 mmol/L (ref 3.5–5.1)
Sodium: 133 mmol/L — ABNORMAL LOW (ref 135–145)

## 2016-09-15 MED ORDER — FLEET ENEMA 7-19 GM/118ML RE ENEM
1.0000 | ENEMA | Freq: Once | RECTAL | Status: AC
  Administered 2016-09-15: 1 via RECTAL

## 2016-09-15 MED ORDER — POTASSIUM CHLORIDE CRYS ER 20 MEQ PO TBCR
40.0000 meq | EXTENDED_RELEASE_TABLET | Freq: Two times a day (BID) | ORAL | Status: DC
  Administered 2016-09-15: 40 meq via ORAL
  Filled 2016-09-15 (×2): qty 2

## 2016-09-15 MED ORDER — CARVEDILOL 3.125 MG PO TABS
6.2500 mg | ORAL_TABLET | Freq: Two times a day (BID) | ORAL | Status: DC
  Administered 2016-09-15 – 2016-09-18 (×7): 6.25 mg via ORAL
  Filled 2016-09-15 (×7): qty 2

## 2016-09-15 MED ORDER — IOPAMIDOL (ISOVUE-300) INJECTION 61%
INTRAVENOUS | Status: AC
  Filled 2016-09-15: qty 30

## 2016-09-15 MED ORDER — LACTULOSE 10 GM/15ML PO SOLN
30.0000 g | Freq: Two times a day (BID) | ORAL | Status: DC
  Administered 2016-09-15 (×2): 30 g via ORAL
  Filled 2016-09-15 (×3): qty 60

## 2016-09-15 MED ORDER — SENNOSIDES-DOCUSATE SODIUM 8.6-50 MG PO TABS
2.0000 | ORAL_TABLET | Freq: Two times a day (BID) | ORAL | Status: DC
  Administered 2016-09-15 – 2016-09-18 (×7): 2 via ORAL
  Filled 2016-09-15 (×7): qty 2

## 2016-09-15 MED ORDER — ALUM & MAG HYDROXIDE-SIMETH 200-200-20 MG/5ML PO SUSP
30.0000 mL | Freq: Four times a day (QID) | ORAL | Status: DC | PRN
  Administered 2016-09-15 – 2016-09-18 (×3): 30 mL via ORAL
  Filled 2016-09-15 (×3): qty 30

## 2016-09-15 NOTE — Progress Notes (Signed)
Consult for GI called to Dr Oneida Alar.

## 2016-09-15 NOTE — Progress Notes (Signed)
PROGRESS NOTE    [name] [name]  ZOX:[id number] DOB: 11-14-1950 DOA: 09/11/2016 PCP: Sallee Lange, MD   Brief Narrative: 66 y.o.malewith medical history significant of morbid obesity, OSA (not yet on CPAP), HLD, HTN, diabetes, CAD s/p 6v CABG in 2001 and subsequent PCI, AICD, and CHF (05/08/16 Echo with EF 15% and grade 3 diastolic dysfunction), admitted for worsening lower extremity edema, shortness of breath consistent with CHF despite taking increased dose Lasix.  Assessment & Plan:  #Acute on chronic combined systolic and diastolic congestive heart failure: EF of 15%. -Patient is up about 15 pounds from his baseline. On IV Lasix with negative for about 3.4 L. patient has labile renal function. Serum creatinine level is stable at 1.3 today with mild attending obedient. He has anasarca therefore continue IV Lasix twice a day. Continue to monitor urine output, electrolytes. Continue low-salt diet. -ACEI on hold because of labile renal function. Continue to hold. -Repeat echocardiogram with EF of 10-15% and moderate AS. -Cardiology consult appreciated.   #Acute kidney injury likely hemodynamically mediated in the setting of diuretics and ACEI. Serum creatinine level stable today. Patient requires IV Lasix for CHF management. This monitor electrolytes and renal function closely. Continue to monitor. Avoid productions.  #Hypokalemia in the setting of diuretics: Serum potassium level improved today. Reduced potassium chloride to twice a day. Check magnesium level in the morning.   #Dyslipidemia: Crestor  #Coronary artery disease: No chest pain. Currently on aspirin Plavix, Coreg and diuretics.  #Constipation. Patient reported heart is stable and lower abdominal pain. Abdomen x-ray obtained yesterday with no acute finding. Patient was treated with enema, Dulcolax, MiraLAX, lactulose without improvement. I ordered CT scan of abdomen and pelvis to further evaluate the abdomen. I again ordered  in enema and added senna on top of MiraLAX, lactulose and Dulcolax as needed. GI consult requested.   Principal Problem:   Acute on chronic congestive heart failure (HCC) Active Problems:   Hyperlipemia   Morbid obesity (HCC)   Obstructive sleep apnea   Diabetes mellitus, type 2 (HCC)   Essential hypertension   Prolonged Q-T interval on ECG   Hypokalemia   Anasarca   Constipation   DVT prophylaxis: Lovenox subcutaneous Code Status: Partial DO NOT RESUSCITATE Family Communication: No family present at bedside Disposition Plan: Likely discharge home in 2-3 days. Still diuresis more with IV diuretics.   Consultants:   Cardiologist  GI consult requested  Procedures: Echocardiogram Antimicrobials: None  Subjective: Patient was seen and examined at bedside. Patient reported constipation and not having good bowel movement. He couldn't sleep last night because of discomfort. Reports a stable shortness of breath. Denied headache, dizziness, chest pain, nausea vomiting.  Objective: Vitals:   09/14/16 1528 09/14/16 1845 09/14/16 2156 09/15/16 0517  BP: 107/81 96/62 95/75  (!) 128/94  Pulse: 82 83 84 86  Resp:   18 18  Temp:   97.5 F (36.4 C) 97.5 F (36.4 C)  TempSrc:   Oral Oral  SpO2: 95%  93% 96%  Weight:    (!) 141 kg (310 lb 12.8 oz)  Height:    5' 10.5" (1.791 m)    Intake/Output Summary (Last 24 hours) at 09/15/16 1208 Last data filed at 09/15/16 0451  Gross per 24 hour  Intake                0 ml  Output              550 ml  Net             -  550 ml   Filed Weights   09/13/16 0507 09/14/16 0500 09/15/16 0517  Weight: (!) 136.2 kg (300 lb 4.3 oz) (!) 140.9 kg (310 lb 10.1 oz) (!) 141 kg (310 lb 12.8 oz)    Examination:  General exam: Appears calm and comfortable  Respiratory system:Bibasal decreased breath sound, no crackle, no wheezing Cardiovascular system: S1 & S2 heard, RRR. No murmur appreciated Gastrointestinal system: Abdomen is soft, nontender,  distended. Bowel sound positive Central nervous system: Alert awake and following commands. No focal neurological deficit. Extremities: Symmetric 5 x 5 power. Bilateral lower extremities pitting edema. Skin: No rashes, lesions or ulcers Psychiatry: Judgement and insight appear normal. Mood & affect appropriate.     Data Reviewed: I have personally reviewed following labs and imaging studies  CBC:  Recent Labs Lab 09/11/16 1524 09/12/16 0320  WBC 11.6* 10.8*  NEUTROABS 9.0* 8.4*  HGB 15.5 14.1  HCT 47.5 42.9  MCV 95.0 94.5  PLT 256 951   Basic Metabolic Panel:  Recent Labs Lab 09/11/16 1524 09/12/16 0320 09/13/16 0452 09/14/16 0513 09/14/16 1712 09/15/16 0912  NA 138 140 135 135  --  133*  K 3.3* 4.0 3.2* 3.2*  --  4.4  CL 96* 99* 93* 92*  --  93*  CO2 33* 32 32 32  --  32  GLUCOSE 175* 187* 175* 155*  --  181*  BUN 19 21* 24* 30*  --  34*  CREATININE 1.08 1.27* 1.19 1.35*  --  1.33*  CALCIUM 9.6 8.8* 8.2* 7.9*  --  8.4*  MG  --   --  2.0  --  2.3  --    GFR: Estimated Creatinine Clearance: 79 mL/min (by C-G formula based on SCr of 1.33 mg/dL (H)). Liver Function Tests: No results for input(s): AST, ALT, ALKPHOS, BILITOT, PROT, ALBUMIN in the last 168 hours. No results for input(s): LIPASE, AMYLASE in the last 168 hours. No results for input(s): AMMONIA in the last 168 hours. Coagulation Profile: No results for input(s): INR, PROTIME in the last 168 hours. Cardiac Enzymes:  Recent Labs Lab 09/11/16 1524 09/11/16 2153 09/12/16 0320 09/12/16 0905  TROPONINI 0.03* <0.03 <0.03 <0.03   BNP (last 3 results) No results for input(s): PROBNP in the last 8760 hours. HbA1C: No results for input(s): HGBA1C in the last 72 hours. CBG:  Recent Labs Lab 09/12/16 0731  GLUCAP 160*   Lipid Profile: No results for input(s): CHOL, HDL, LDLCALC, TRIG, CHOLHDL, LDLDIRECT in the last 72 hours. Thyroid Function Tests: No results for input(s): TSH, T4TOTAL, FREET4,  T3FREE, THYROIDAB in the last 72 hours. Anemia Panel: No results for input(s): VITAMINB12, FOLATE, FERRITIN, TIBC, IRON, RETICCTPCT in the last 72 hours. Sepsis Labs: No results for input(s): PROCALCITON, LATICACIDVEN in the last 168 hours.  Recent Results (from the past 240 hour(s))  Culture, Urine     Status: None   Collection Time: 09/13/16 11:16 AM  Result Value Ref Range Status   Specimen Description URINE, CLEAN CATCH  Final   Special Requests NONE  Final   Culture   Final    NO GROWTH Performed at [facility], [address], [place], [place] [address]    Report Status 09/14/2016 FINAL  Final         Radiology Studies: Dg Abd 2 Views  Result Date: 09/14/2016 CLINICAL DATA:  Constipation EXAM: ABDOMEN - 2 VIEW COMPARISON:  Three days ago FINDINGS: Normal bowel gas pattern. No abnormal stool retention or rectal  impaction. Stone over the right abdomen which could be renal, biliary, or other. Atherosclerotic calcification and spondylosis. Cardiomegaly.  Transvenous ICD. IMPRESSION: No abnormal stool retention or rectal impaction. Normal bowel gas pattern. Electronically Signed   By: Monte Fantasia M.D.   On: 09/14/2016 09:56        Scheduled Meds: . aspirin  325 mg Oral Daily  . carvedilol  6.25 mg Oral BID WC  . clopidogrel  75 mg Oral Daily  . enoxaparin (LOVENOX) injection  70 mg Subcutaneous Q24H  . fluticasone  2 spray Each Nare Daily  . furosemide  60 mg Intravenous BID  . iopamidol      . lactulose  30 g Oral BID  . polyethylene glycol  17 g Oral Daily  . potassium chloride  40 mEq Oral TID  . rosuvastatin  40 mg Oral q1800  . senna-docusate  2 tablet Oral BID  . sodium phosphate  1 enema Rectal Once   Continuous Infusions:   LOS: 4 days    Yoni Lobos Tanna Furry, MD [facility] Pager [phone]  If 7PM-7AM, please contact night-coverage [email or web address] Password [email or web address] 09/15/2016, 12:08 PM

## 2016-09-15 NOTE — Consult Note (Signed)
REVIEWED-NO ADDITIONAL RECOMMENDATIONS.  Referring Provider: No ref. provider found Primary Care Physician:  Sallee Lange, MD Primary Gastroenterologist:  Dr. Oneida Alar (new, unassigned)  Date of Admission: 09/12/15 Date of Consultation: 09/16/15  Reason for Consultation:  "severe constipation"  HPI:  [name] [name] is a 66 y.o. male with a past medical history of coronary artery disease, congestive heart failure, chronic back pain, diabetes, morbid obesity status post automatic cardioverter/defibrillator placement. His last echocardiogram on 05/08/2016 showed an EF of 15% and grade 3 diastolic dysfunction. He presented to the emergency department stating "furosemide isn't working." Specifically, he noted a 47 pound weight gain over approximately 1-2 weeks as well as shortness of breath. Minimal urinary output. Also with worsening lower extremity edema and dyspnea. He was started on IV Lasix and is down about 3.4 L with labile renal function and creatinine stable at 1.3 noted anasarca. Acute kidney injury likely in the setting of diuretics and ACE inhibitor. Noted constipation with lower abdominal pain. Abdominal x-ray obtained yesterday with no acute finding and specifically "normal gas pattern, no abnormal stool retention or rectal impaction." He was given an enema, Dulcolax, MiraLAX, and lactulose without improvement. CT scan of the abdomen and pelvis in order to further evaluate and additional order for enema, senna, MiraLAX, lactulose, Dulcolax as needed was given. GI was consulted as well.  Today he states he has had constipation for the past 2 weeks. Otherwise, not typically a problem for him. Was having lower abdominal pain as well. However, about "20 to 30 minutes before you got here we struck up the band!" States he had a "super large" bowel movement at that time, no noted blood. Generally soft. Abdominal pain is resolved. Denies N/V, abdominal pain. He has never had a colonoscopy. No further GI  complaints.  Past Medical History:  Diagnosis Date  . AICD (automatic cardioverter/defibrillator) present 05/18/2016  . CAD (coronary artery disease)   . Cardiomyopathy, ischemic   . CHF (congestive heart failure) ([place])   . Chronic back pain   . Diabetes mellitus without complication (HCC)    prediabetes  . Elevated PSA   . Hyperlipidemia   . Morbid obesity ([place])   . OSA (obstructive sleep apnea)    does not wear CPAP, thinks one has been ordered    Past Surgical History:  Procedure Laterality Date  . APPENDECTOMY    . CAROTID STENT    . CATARACT EXTRACTION, BILATERAL    . CORONARY ARTERY BYPASS GRAFT    . EP IMPLANTABLE DEVICE N/A 05/18/2016   Procedure: ICD Implant;  Surgeon: Will Meredith Leeds, MD;  Location: [facility] [facility];  Service: Cardiovascular;  Laterality: N/A;  . ingrown toenail N/A   . LEFT HEART CATHETERIZATION WITH CORONARY/GRAFT ANGIOGRAM N/A 12/21/2014   Procedure: LEFT HEART CATHETERIZATION WITH Beatrix Fetters;  Surgeon: Belva Crome, MD;  Location: [facility] CATH [facility];  Service: Cardiovascular;  Laterality: N/A;    Prior to Admission medications   Medication Sig Start Date End Date Taking? Authorizing Provider  aspirin 325 MG tablet Take 325 mg by mouth daily.   Yes Historical Provider, MD  clopidogrel (PLAVIX) 75 MG tablet Take 1 tablet (75 mg total) by mouth daily. 07/11/16  Yes Kathyrn Drown, MD  furosemide (LASIX) 40 MG tablet Take 1 tablet (40 mg total) by mouth daily as needed for edema. 07/11/16  Yes Kathyrn Drown, MD  HYDROcodone-acetaminophen (NORCO) 10-325 MG tablet Take 1 tablet by mouth every 8 (eight) hours as needed. Patient  taking differently: Take 1 tablet by mouth every 8 (eight) hours as needed for moderate pain.  07/11/16  Yes Kathyrn Drown, MD  lisinopril (PRINIVIL,ZESTRIL) 5 MG tablet Take 1 tablet (5 mg total) by mouth daily. 07/11/16  Yes Kathyrn Drown, MD  Multiple Vitamins-Minerals (CENTRUM SILVER PO) Take 1 tablet by mouth  daily.    Yes Historical Provider, MD  nitroGLYCERIN (NITROSTAT) 0.4 MG SL tablet PLACE 1 TABLET UNDER TONGUE FOR CHEST PAIN. MAY REPEAT EVERY 5 MIN UPTO 3 DOSES-NO RELIEF,CALL 911. 08/23/16  Yes Kathyrn Drown, MD  rosuvastatin (CRESTOR) 40 MG tablet Take 1 tablet (40 mg total) by mouth daily. 07/11/16  Yes Kathyrn Drown, MD  trolamine salicylate (ASPERCREME) 10 % cream Apply 1 application topically as needed for muscle pain.   Yes Historical Provider, MD    Current Facility-Administered Medications  Medication Dose Route Frequency Provider Last Rate Last Dose  . acetaminophen (TYLENOL) tablet 650 mg  650 mg Oral Q4H PRN Karmen Bongo, MD   650 mg at 09/13/16 1547  . aspirin tablet 325 mg  325 mg Oral Daily Karmen Bongo, MD   325 mg at 09/15/16 0852  . bisacodyl (DULCOLAX) suppository 10 mg  10 mg Rectal Daily PRN Thurnell Lose, MD   10 mg at 09/13/16 2359  . carvedilol (COREG) tablet 6.25 mg  6.25 mg Oral BID WC Lendon Colonel, NP      . clopidogrel (PLAVIX) tablet 75 mg  75 mg Oral Daily Karmen Bongo, MD   75 mg at 09/14/16 2158  . enoxaparin (LOVENOX) injection 70 mg  70 mg Subcutaneous Q24H Karmen Bongo, MD   70 mg at 09/14/16 2202  . fluticasone (FLONASE) 50 MCG/ACT nasal spray 2 spray  2 spray Each Nare Daily Dron Tanna Furry, MD   2 spray at 09/15/16 0850  . furosemide (LASIX) injection 60 mg  60 mg Intravenous BID Arnoldo Lenis, MD   60 mg at 09/15/16 0850  . HYDROcodone-acetaminophen (NORCO) 10-325 MG per tablet 1 tablet  1 tablet Oral Q8H PRN Karmen Bongo, MD   1 tablet at 09/15/16 1429  . iopamidol (ISOVUE-300) 61 % injection           . lactulose (CHRONULAC) 10 GM/15ML solution 30 g  30 g Oral BID Dron Tanna Furry, MD   30 g at 09/15/16 1404  . ondansetron (ZOFRAN) injection 4 mg  4 mg Intravenous Q6H PRN Karmen Bongo, MD   4 mg at 09/15/16 1235  . polyethylene glycol (MIRALAX / GLYCOLAX) packet 17 g  17 g Oral Daily Dron Tanna Furry, MD   17 g at  09/15/16 0848  . potassium chloride SA (K-DUR,KLOR-CON) CR tablet 40 mEq  40 mEq Oral BID Dron Tanna Furry, MD      . rosuvastatin (CRESTOR) tablet 40 mg  40 mg Oral q1800 Karmen Bongo, MD   40 mg at 09/14/16 2212  . senna-docusate (Senokot-S) tablet 2 tablet  2 tablet Oral BID Dron Tanna Furry, MD   2 tablet at 09/15/16 1404    Allergies as of 09/11/2016 - Review Complete 09/11/2016  Allergen Reaction Noted  . Dyflex-g [dyphylline-guaifenesin] Hives 05/03/2013    Family History  Problem Relation Age of Onset  . Adopted: Yes  . Family history unknown: Yes    Social History   Social History  . Marital status: Married    Spouse name: N/A  . Number of children: N/A  . Years of education: N/A  Occupational History  . retired    Social History Main Topics  . Smoking status: Former Research scientist (life sciences)  . Smokeless tobacco: Never Used  . Alcohol use No  . Drug use: No  . Sexual activity: Not on file   Other Topics Concern  . Not on file   Social History Narrative  . No narrative on file    Review of Systems: General: Negative for anorexia, weight loss, fever, chills, fatigue, weakness. CV: Negative for chest pain, angina, palpitations. Admits peripheral edema.  Respiratory: Negative for dyspnea at rest, cough, sputum, wheezing.  GI: See history of present illness. Neuro: Negative for memory loss, confusion.  Endo: Admits putting on significant weight leading to hospitalization and now losing weight with diuresis.  Physical Exam: Vital signs in last 24 hours: Temp:  [97.5 F (36.4 C)] 97.5 F (36.4 C) (01/19 0517) Pulse Rate:  [83-86] 86 (01/19 0517) Resp:  [18] 18 (01/19 0517) BP: (95-128)/(62-94) 128/94 (01/19 0517) SpO2:  [93 %-96 %] 96 % (01/19 0517) Weight:  [310 lb 12.8 oz (141 kg)] 310 lb 12.8 oz (141 kg) (01/19 0517) Last BM Date: 09/14/16 General:   Morbidly obese male. Alert,  Well-developed, well-nourished, pleasant and cooperative in NAD Head:   Normocephalic and atraumatic. Eyes:  Sclera clear, no icterus. Conjunctiva pink. Ears:  Normal auditory acuity. Neck:  Supple; no masses or thyromegaly. Lungs:  Clear throughout to auscultation. No wheezes, crackles, or rhonchi. No acute distress. Heart:  Regular rate and rhythm; no murmurs, clicks, rubs,  or gallops. Abdomen:  Rounded but soft, nontender and nondistended. Normal bowel sounds, without guarding, and without rebound.   Rectal:  Deferred.   Msk:  Symmetrical without gross deformities. Extremities:  Bilateral lower extremity edema. Neurologic:  Alert and  oriented x4;  grossly normal neurologically. Psych:  Alert and cooperative. Normal mood and affect.  Intake/Output from previous day: 01/18 0701 - 01/19 0700 In: 240 [P.O.:240] Out: 975 [Urine:975] Intake/Output this shift: Total I/O In: -  Out: 500 [Urine:500]  Lab Results: No results for input(s): WBC, HGB, HCT, PLT in the last 72 hours. BMET  Recent Labs  09/13/16 0452 09/14/16 0513 09/15/16 0912  NA 135 135 133*  K 3.2* 3.2* 4.4  CL 93* 92* 93*  CO2 32 32 32  GLUCOSE 175* 155* 181*  BUN 24* 30* 34*  CREATININE 1.19 1.35* 1.33*  CALCIUM 8.2* 7.9* 8.4*   LFT No results for input(s): PROT, ALBUMIN, AST, ALT, ALKPHOS, BILITOT, BILIDIR, IBILI in the last 72 hours. PT/INR No results for input(s): LABPROT, INR in the last 72 hours. Hepatitis Panel No results for input(s): HEPBSAG, HCVAB, HEPAIGM, HEPBIGM in the last 72 hours. C-Diff No results for input(s): CDIFFTOX in the last 72 hours.  Studies/Results: Dg Abd 2 Views  Result Date: 09/14/2016 CLINICAL DATA:  Constipation EXAM: ABDOMEN - 2 VIEW COMPARISON:  Three days ago FINDINGS: Normal bowel gas pattern. No abnormal stool retention or rectal impaction. Stone over the right abdomen which could be renal, biliary, or other. Atherosclerotic calcification and spondylosis. Cardiomegaly.  Transvenous ICD. IMPRESSION: No abnormal stool retention or rectal  impaction. Normal bowel gas pattern. Electronically Signed   By: Monte Fantasia M.D.   On: 09/14/2016 09:56    Impression: 66 year old male who we were asked to see due to constipation. Abdominal x-ray indicates no abnormal stool collection or rectal impaction. CT has not been completed yet. No chronic constipation. New constipation in the setting of kidney injury, significant diuresis, and pain medications.  Had a bowel movement just prior to visit described as "super large" and states he feels great now. No more abdominal pain. No hematochezia or melena (per patient.) States stools were mostly soft "from all the medicine they've been giving me to help me go." No other GI complaints. Has never had a colonoscopy.  Plan: 1. Can consider maintenance constipation medication such as Linzess 145 mcg or 290 mcg daily (empty stomach) 2. Miralax bid as needed for no bowel movement in 2 days 3. Colace stool softener daily 4. Will need a colonoscopy as an outpatient (has never had one.)   Thank you for allowing [place] to participate in the care of [place], DNP, AGNP-C Adult & Gerontological Nurse Practitioner [facility]    LOS: 4 days     09/15/2016, 3:29 PM

## 2016-09-15 NOTE — Progress Notes (Signed)
Progress Note  Patient Name: [name] [name] Date of Encounter: 09/15/2016  Primary Cardiologist: Dorris Carnes   Subjective   Wants to go home. Irritable. Breathing status is better.   Inpatient Medications    Scheduled Meds: . aspirin  325 mg Oral Daily  . carvedilol  3.125 mg Oral BID WC  . clopidogrel  75 mg Oral Daily  . enoxaparin (LOVENOX) injection  70 mg Subcutaneous Q24H  . fluticasone  2 spray Each Nare Daily  . furosemide  60 mg Intravenous BID  . polyethylene glycol  17 g Oral Daily  . potassium chloride  40 mEq Oral TID  . rosuvastatin  40 mg Oral q1800   Continuous Infusions:  PRN Meds: acetaminophen, bisacodyl, HYDROcodone-acetaminophen, ondansetron (ZOFRAN) IV   Vital Signs    Vitals:   09/14/16 1528 09/14/16 1845 09/14/16 2156 09/15/16 0517  BP: 107/81 96/62 95/75  (!) 128/94  Pulse: 82 83 84 86  Resp:   18 18  Temp:   97.5 F (36.4 C) 97.5 F (36.4 C)  TempSrc:   Oral Oral  SpO2: 95%  93% 96%  Weight:    (!) 310 lb 12.8 oz (141 kg)  Height:    5' 10.5" (1.791 m)    Intake/Output Summary (Last 24 hours) at 09/15/16 0828 Last data filed at 09/15/16 0451  Gross per 24 hour  Intake              240 ml  Output              975 ml  Net             -735 ml   Filed Weights   09/13/16 0507 09/14/16 0500 09/15/16 0517  Weight: (!) 300 lb 4.3 oz (136.2 kg) (!) 310 lb 10.1 oz (140.9 kg) (!) 310 lb 12.8 oz (141 kg)    Telemetry    Sinus rhythm with some tachycardia noted rates up to 90 bpm. - Personally Reviewed  ECG     Personally Reviewed  Physical Exam   GEN: No acute distress. Sleepy Neck: No JVD  Cardiac: RRR, tachycardic,, no murmurs, rubs, or gallops.  Radials/DP/PT 2+ and equal bilaterally.  Respiratory:  Clear to auscultation bilaterally. GI: Soft, nontender, non-distended  MS: no deformity; 2+ pitting edema with TED hose. Neuro:  Alert and oriented x 3. Irritable.  Labs    Chemistry Recent Labs Lab 09/12/16 0320  09/13/16 0452 09/14/16 0513  NA 140 135 135  K 4.0 3.2* 3.2*  CL 99* 93* 92*  CO2 32 32 32  GLUCOSE 187* 175* 155*  BUN 21* 24* 30*  CREATININE 1.27* 1.19 1.35*  CALCIUM 8.8* 8.2* 7.9*  GFRNONAA 58* >60 54*  GFRAA >60 >60 >60  ANIONGAP 9 10 11      Hematology Recent Labs Lab 09/11/16 1524 09/12/16 0320  WBC 11.6* 10.8*  RBC 5.00 4.54  HGB 15.5 14.1  HCT 47.5 42.9  MCV 95.0 94.5  MCH 31.0 31.1  MCHC 32.6 32.9  RDW 14.9 15.0  PLT 256 261    Cardiac Enzymes Recent Labs Lab 09/11/16 1524 09/11/16 2153 09/12/16 0320 09/12/16 0905  TROPONINI 0.03* <0.03 <0.03 <0.03   No results for input(s): TROPIPOC in the last 168 hours.   BNP Recent Labs Lab 09/11/16 1524  BNP 733.0*      Radiology    Dg Abd 2 Views  Result Date: 09/14/2016 CLINICAL DATA:  Constipation EXAM: ABDOMEN - 2 VIEW COMPARISON:  Three days  ago FINDINGS: Normal bowel gas pattern. No abnormal stool retention or rectal impaction. Stone over the right abdomen which could be renal, biliary, or other. Atherosclerotic calcification and spondylosis. Cardiomegaly.  Transvenous ICD. IMPRESSION: No abnormal stool retention or rectal impaction. Normal bowel gas pattern. Electronically Signed   By: Monte Fantasia M.D.   On: 09/14/2016 09:56    Cardiac Studies   Echocardiogram: 09/13/2015 Left ventricle: The cavity size was normal. Wall thickness was   normal. The estimated ejection fraction was in the range of 10%   to 15%. Diffuse hypokinesis. Doppler parameters are consistent   with restrictive physiology, indicative of decreased left   ventricular diastolic compliance and/or increased left atrial   pressure. Doppler parameters are consistent with high ventricular   filling pressure. - Aortic valve: Moderately calcified annulus. Moderately thickened   leaflets. There was moderate stenosis. AV gradients decreased in   the setting of severe LV systolic dysfunction. AVA VTI and   dimensionless index  support moderate aortic stenosis. Mean   gradient (S): 17 mm Hg. Valve area (VTI): 1.2 cm^2. Valve area   (Vmax): 1.1 cm^2. - Left atrium: The atrium was severely dilated. - Pulmonary arteries: Systolic pressure was moderately increased.   PA peak pressure: 58 mm Hg (S). - Inferior vena cava: The vessel was dilated. The respirophasic   diameter changes were blunted (< 50%), consistent with elevated   central venous pressure. - Technically difficult study. Echocontrast was used to enhance   visualization.  Patient Profile     66 y.o. male with history of hypertension, systolic dysfunction, with ischemic cardiomyopathy EF of 15%, ICD in situ, ( followed by Dr. Curt Bears), coronary artery disease status post CABG 6 in 2001, (LIMA to distal LAD, SVG to RI, SVG to OM 2, sequential SVG to our PLA, SVG to acute marginal, SVG to posterior descending artery), hyperlipidemia, with other history to include diabetes, and OSA not using CPAP.Marland KitchenAdmitted with A/C systolic CHF wt gain of 15 lbs,  and hypokalemia.  Assessment & Plan    1.Acute on Chronic Systolic CHF: He continues to diurese with IV lasix. Wts are inaccurate. Will have him weigh consistently on standup scale. He has diuresed  3.4 liters since admission. Dry wt 302 lbs. He is at 310. BP is not adequate for reduced systolic function. Increase coreg to 6.25 mg BID. Creatinine is 1.35 this am with hypokalemia. Potassium is 3.2 and is being repleted. Not on ACE at this time. If renal status improves will begin Entresto at later date. For now, BP will be controlled with increased dose of BB. Repeat echo stable LVEF 10-15%, moderate AS.    2.OSA: Non-compliant with CPAP. States he does not sleep well in hospital. Consider trial of CPAP while admitted. Will defer to PCP.   3. ICD in situ: Device check on 08/25/2016. Normal device function. Threshold and sensing consistent with previous device measurements. Impedance trend stable over time. No evidence  of any ventricular arrhythmias. Histogram distribution appropriate for patient and level of  activity. No changes made this session. Device programmed at appropriate safety margins. Device programmed to optimize intrinsic conduction. Estimated longevity 11.4 years. Patient education completed including shock plan. [facility] 11/27/16, ROV with WC in 9 months.Domingo Dimes RN, BSN    Signed, Jory Sims, NP  09/15/2016, 8:28 AM    The patient was seen and examined, and I agree with the history, physical exam, assessment and plan as documented above, with modifications as noted below. Primary complaint is  constipation, which kept him up all night. Michela Pitcher he has been able to walk much further without having to stop due to shortness of breath. Creatinine stable at 1.33. Slight increase in BUN. Continue IV Lasix 60 mg bid for now.  Kate Sable, MD, [facility]  09/15/2016 10:30 AM

## 2016-09-16 DIAGNOSIS — K5903 Drug induced constipation: Secondary | ICD-10-CM

## 2016-09-16 LAB — BASIC METABOLIC PANEL
Anion gap: 9 (ref 5–15)
BUN: 38 mg/dL — ABNORMAL HIGH (ref 6–20)
CALCIUM: 9 mg/dL (ref 8.9–10.3)
CO2: 33 mmol/L — AB (ref 22–32)
CREATININE: 1.52 mg/dL — AB (ref 0.61–1.24)
Chloride: 92 mmol/L — ABNORMAL LOW (ref 101–111)
GFR calc non Af Amer: 46 mL/min — ABNORMAL LOW (ref >60)
GFR, EST AFRICAN AMERICAN: 54 mL/min — AB (ref >60)
Glucose, Bld: 143 mg/dL — ABNORMAL HIGH (ref 65–99)
Potassium: 4.2 mmol/L (ref 3.5–5.1)
SODIUM: 134 mmol/L — AB (ref 135–145)

## 2016-09-16 LAB — MAGNESIUM: Magnesium: 2.4 mg/dL (ref 1.7–2.4)

## 2016-09-16 LAB — PROTEIN, TOTAL: Total Protein: 7.1 g/dL (ref 6.5–8.1)

## 2016-09-16 LAB — ALBUMIN: Albumin: 3.6 g/dL (ref 3.5–5.0)

## 2016-09-16 MED ORDER — OXYMETAZOLINE HCL 0.05 % NA SOLN
1.0000 | Freq: Two times a day (BID) | NASAL | Status: DC | PRN
  Administered 2016-09-17: 1 via NASAL
  Filled 2016-09-16: qty 15

## 2016-09-16 MED ORDER — POTASSIUM CHLORIDE CRYS ER 20 MEQ PO TBCR
40.0000 meq | EXTENDED_RELEASE_TABLET | Freq: Every day | ORAL | Status: DC
  Administered 2016-09-17 – 2016-09-18 (×2): 40 meq via ORAL
  Filled 2016-09-16 (×2): qty 2

## 2016-09-16 MED ORDER — FUROSEMIDE 10 MG/ML IJ SOLN
80.0000 mg | Freq: Three times a day (TID) | INTRAMUSCULAR | Status: DC
  Administered 2016-09-16 – 2016-09-18 (×8): 80 mg via INTRAVENOUS
  Filled 2016-09-16 (×8): qty 8

## 2016-09-16 MED ORDER — CALCIUM POLYCARBOPHIL 625 MG PO TABS
1250.0000 mg | ORAL_TABLET | Freq: Every day | ORAL | Status: DC
  Administered 2016-09-16 – 2016-09-18 (×3): 1250 mg via ORAL
  Filled 2016-09-16 (×5): qty 2

## 2016-09-16 MED ORDER — NALOXEGOL OXALATE 12.5 MG PO TABS
12.5000 mg | ORAL_TABLET | Freq: Every day | ORAL | Status: DC
  Administered 2016-09-16 – 2016-09-18 (×3): 12.5 mg via ORAL
  Filled 2016-09-16 (×5): qty 1

## 2016-09-16 MED ORDER — POLYETHYLENE GLYCOL 3350 17 G PO PACK: 17.0000 g | PACK | Freq: Two times a day (BID) | ORAL | Status: DC | PRN

## 2016-09-16 NOTE — Final Progress Note (Addendum)
Assessment/Plan: ADMITTED WITH A CHF EXACERBATION. PT OFF DIET AND MEDS DUE TO WIFE'S ILLNESS FOR PAST 10 WEEKS. TOLERATES MIRALAX/MOM/DULCOLAX AT HOME IF NEEDED  FOR CONSTIPATION. HAD CONSTIPATION FOR 1.5 WEEKS PRIOR TO ADMISSION AND IT WAS ASSOCIATED WITH ABDOMINAL PAIN. HE STARTED TAKING NORCO MORE FREQUENTLY.   CONSTIPATION EXACERBATED BY INCREASE IN PAIN MEDS & DECREASED MOBILITY.  BM YESTERDAY. TODAY C/O BLOATING AND ABDOMINAL FELT DUE TO GAS AFTER MIRALAX, LACTULOSE, SENNA, AND ENEMAS.   PLAN: 1. SIMPLIFY BOWEL REGIMEN. D/C LACTULOSE WHICH IS THE MOST LIKELY CAUSE FOR GAS/BLOATING/ABDOMINAL PAIN. CHANGE MIRALAX TO PRN. CONTINUE SENNA. ADD MOVANTIK 12.5 MG DAILY. 2. ADD FIBER TABS 2 DAILY WITH LUNCH. 3. CONTINUE HEART HEALTHY DIET. 4. NO INDICATION FOR COLONOSCOPY AT THIS TIME. WILL DISCUSS BENEFITS V. RISKS OF A SCREENING COLONOSCOPY AS AN OUTPATIENT. LONG TERM PROGNOSIS DUE TO CHF(EF 15%) IS POOR AND PT IS HIGH RISK FOR ANESTHESIA DUE TO MULTIPLE CO-MORBIDITIES: BMI > 40, OSA, EF 15%.     GREATER THAN 50% WAS SPENT IN COUNSELING & COORDINATION OF CARE WITH THE PATIENT AND HIS WIFE: DISCUSSED  BENEFITS, MEDICATION SIDE EFFECTS, AND MANAGEMENT OF CONSTIPATION. TOTAL ENCOUNTER TIME: 25 MINS.  Subjective: Since last evaluated the patient THE PATIENT I C/O GAS/CRAMPY ABDOMINAL PAIN AND BLOATING. HE DOES NOT LIKE HIS BREAKFAST AND WANTS TO GO HOME. HE HAS NEVER HAD COLONOSCOPY. USES MOM/DULCOLAX/MIRALAX AT HOME IF NEEDED FO CONSTIPATION WHICH IS RARE. WHEN FOLLOWING THE DIET HIS WIFE FIXES FOR HIM, HE USUALLY DOESN'T HAVE A PROBLEM WITH CONSTIPATION.  Objective: Vital signs in last 24 hours: Vitals:   09/15/16 2217 09/16/16 0700  BP:  120/86  Pulse:  80  Resp:  20  Temp: 97.8 F (36.6 C) 97.7 F (36.5 C)   General appearance: alert, cooperative and no distress Resp: clear to auscultation bilaterally Cardio: regular rate and IRREGULAR rhythm GI: soft, MILD TENDERNESS x4, NO  REBOUND OR GUARDING; bowel sounds normal;OBESE Extremities: edema PRESENT IN BILATERAL LOWER EXTERMITIES  Lab Results:   GFR 46, Cr 1.52 K 4.2 Na 134    Studies/Results: Ct Abdomen Pelvis Wo Contrast  Result Date: 09/15/2016 CLINICAL DATA:  Lower abdominal pain for 4 days. EXAM: CT ABDOMEN AND PELVIS WITHOUT CONTRAST TECHNIQUE: Multidetector CT imaging of the abdomen and pelvis was performed following the standard protocol without IV contrast. COMPARISON:  None. FINDINGS: Lower chest: Small bilateral pleural effusions. Moderate cardiac enlargement. Hepatobiliary: Hypertrophy of the lateral segment of left lobe and caudate lobe of liver noted. No focal liver abnormality. Stone within the gallbladder measures 7 mm. No ductal dilatation identified. Pancreas: Unremarkable. No pancreatic ductal dilatation or surrounding inflammatory changes. Spleen: Normal in size without focal abnormality. 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 Normal appearance of the right kidney. No mass or hydronephrosis. Indeterminate exophytic low-attenuation structure arising from the inferior pole the left kidney measures 1.3 cm peer incompletely characterized without IV contrast. No left-sided hydronephrosis. Urinary bladder appears normal. Stomach/Bowel: The stomach appears normal. The small bowel loops have a normal course and caliber without obstruction. No pathologic dilatation of the colon. Vascular/Lymphatic: Aortic atherosclerosis. No enlarged mesenteric or retroperitoneal lymph nodes. There is no pelvic or inguinal adenopathy. Reproductive: Prostate is unremarkable. Other: There is a small to moderate amount of ascites identified within the abdomen extending over the liver an along the left pericolic gutter. No focal fluid collections identified. Mild diffuse body wall edema identified. Musculoskeletal: No acute or  significant osseous findings.  Degenerative disc disease is identified. IMPRESSION: 1. Cardiac enlargement, Small pleural effusions, abdominal ascites and mild body wall edema. Could this patient have congestive heart failure? 2. Right adrenal gland myelolipoma 3. Aortic atherosclerosis. 4. Gallstone. Electronically Signed   By: Kerby Moors M.D.   On: 09/15/2016 15:54    Medications: I have reviewed the patient's current medications.   LOS: 5 days   Barney Drain 02/05/2014, 2:23 PM

## 2016-09-16 NOTE — Progress Notes (Signed)
PROGRESS NOTE    FARHAAN MABEE  OZH:[id number] DOB: 06-Mar-1951 DOA: 09/11/2016 PCP: Sallee Lange, MD   Brief Narrative: 66 y.o.malewith medical history significant of morbid obesity, OSA (not yet on CPAP), HLD, HTN, diabetes, CAD s/p 6v CABG in 2001 and subsequent PCI, AICD, and CHF (05/08/16 Echo with EF 15% and grade 3 diastolic dysfunction), admitted for worsening lower extremity edema, shortness of breath consistent with CHF despite taking increased dose Lasix.  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 -Patient has gross anasarca. Currently on IV Lasix 60 mg twice a day with only negative by 3.2 L. Serum creatinine level and BUN trending up with decreasing urine output. Bladder scan was done which showed no urinary retention. Total albumin and protein acceptable. I discussed with the cardiologist on call at [facility] and spoke with Dr.Skains for the possible need of inotropes. We reviewed the case. Patient likely also has acute right-sided heart failure this time. Plan to increase Lasix to 80 mg IV 3 times a day. Continue to monitor urine output with close monitoring of kidney function and serum electrolytes. If no improvement with only Lasix by tomorrow, may consider starting inotropes. -ACEI on hold because of labile renal function. Continue to hold. -Repeat echocardiogram with EF of 10-15% and moderate AS. -Cardiology consult appreciated.   #Acute kidney injury likely hemodynamically mediated in the setting of diuretics and ACEI and congestive heart failure. Serum creatinine level trended up to 1.5 today. Bladder scan with no urinary retention. Lasix increased as discussed above. Continue to monitor BMP and electrolytes closely. Avoid nephrotoxins. I discussed both congestive heart failure and worsening kidney function with the patient and his wife at bedside. They verbalized understanding.  #Hypokalemia in  the setting of diuretics: Serum magnesium level and potassium level acceptable today. I reduced the dose of potassium chloride to once a day. Patient is very hesitant to take potassium chloride tablet. Education provided to the patient regarding the importance of maintaining electrolytes.  #Dyslipidemia: Crestor  #Coronary artery disease: No chest pain. Currently on aspirin Plavix, Coreg and diuretics.  #Constipation. Patient finally had 3 bowel moment and he feels better. Abdominal distention is still there likely contributed by fluid, CHF. CT abdomen pelvis reviewed. GI consult appreciated, next outpatient follow-up are colonoscopy. Principal Problem:   Acute on chronic congestive heart failure (HCC) Active Problems:   Hyperlipemia   Morbid obesity (HCC)   Obstructive sleep apnea   Diabetes mellitus, type 2 (HCC)   Essential hypertension   Prolonged Q-T interval on ECG   Hypokalemia   Anasarca   Constipation   Difficulty in walking, not elsewhere classified   Pain in the abdomen   DVT prophylaxis: Lovenox subcutaneous Code Status: Partial DO NOT RESUSCITATE Family Communication: Discussed with the patient and his wife at bedside in detail. Disposition Plan: Likely discharge home in 2-3 days. Still diuresis more with IV diuretics.   Consultants:   Cardiologist  GI consult requested  Procedures: Echocardiogram Antimicrobials: None  Subjective: Patient was seen and examined at bedside. Patient has multiple concerns and questions. He reported having 3 episodes of bowel movement and feels better. Still has a bloating abdominal distention. Denied shortness of breath, nausea, vomiting. Has decreasing urine output. Wife at bedside. Denied chest pain.  Objective: Vitals:   09/15/16 1718 09/15/16 1948 09/15/16 2217 09/16/16 0700  BP: (!) 147/74 (!) 122/96  120/86  Pulse: 90 78  80  Resp:  20  20  Temp:   97.8 F (36.6 C) 97.7 F (36.5 C)  TempSrc:   Oral Oral  SpO2:  98%   98%  Weight:    (!) 141.3 kg (311 lb 9.6 oz)  Height:        Intake/Output Summary (Last 24 hours) at 09/16/16 1117 Last data filed at 09/16/16 0920  Gross per 24 hour  Intake              840 ml  Output              700 ml  Net              140 ml   Filed Weights   09/14/16 0500 09/15/16 0517 09/16/16 0700  Weight: (!) 140.9 kg (310 lb 10.1 oz) (!) 141 kg (310 lb 12.8 oz) (!) 141.3 kg (311 lb 9.6 oz)    Examination:  General exam: Not in distress, lying on bed comfortable.  Respiratory system: Clear to auscultation bilateral, no crackle or wheezing Cardiovascular system: Regular rate rhythm, S1-S2 normal. Gastrointestinal system: Abdomen is firm, distended, nontender. Bowel sound positive Central nervous system: Alert awake and following commands. No focal neurological deficit. Extremities: Symmetric 5 x 5 power. Bilateral lower extremities pitting edema. Skin: No rashes, lesions or ulcers Psychiatry: Judgement and insight appear normal. Mood & affect appropriate.     Data Reviewed: I have personally reviewed following labs and imaging studies  CBC:  Recent Labs Lab 09/11/16 1524 09/12/16 0320  WBC 11.6* 10.8*  NEUTROABS 9.0* 8.4*  HGB 15.5 14.1  HCT 47.5 42.9  MCV 95.0 94.5  PLT 256 528   Basic Metabolic Panel:  Recent Labs Lab 09/12/16 0320 09/13/16 0452 09/14/16 0513 09/14/16 1712 09/15/16 0912 09/16/16 0639  NA 140 135 135  --  133* 134*  K 4.0 3.2* 3.2*  --  4.4 4.2  CL 99* 93* 92*  --  93* 92*  CO2 32 32 32  --  32 33*  GLUCOSE 187* 175* 155*  --  181* 143*  BUN 21* 24* 30*  --  34* 38*  CREATININE 1.27* 1.19 1.35*  --  1.33* 1.52*  CALCIUM 8.8* 8.2* 7.9*  --  8.4* 9.0  MG  --  2.0  --  2.3  --  2.4   GFR: Estimated Creatinine Clearance: 69.2 mL/min (by C-G formula based on SCr of 1.52 mg/dL (H)). Liver Function Tests:  Recent Labs Lab 09/16/16 0639  PROT 7.1  ALBUMIN 3.6   No results for input(s): LIPASE, AMYLASE in the last 168  hours. No results for input(s): AMMONIA in the last 168 hours. Coagulation Profile: No results for input(s): INR, PROTIME in the last 168 hours. Cardiac Enzymes:  Recent Labs Lab 09/11/16 1524 09/11/16 2153 09/12/16 0320 09/12/16 0905  TROPONINI 0.03* <0.03 <0.03 <0.03   BNP (last 3 results) No results for input(s): PROBNP in the last 8760 hours. HbA1C: No results for input(s): HGBA1C in the last 72 hours. CBG:  Recent Labs Lab 09/12/16 0731  GLUCAP 160*   Lipid Profile: No results for input(s): CHOL, HDL, LDLCALC, TRIG, CHOLHDL, LDLDIRECT in the last 72 hours. Thyroid Function Tests: No results for input(s): TSH, T4TOTAL, FREET4, T3FREE, THYROIDAB in the last 72 hours. Anemia Panel: No results for input(s): VITAMINB12, FOLATE, FERRITIN, TIBC, IRON, RETICCTPCT in the last 72 hours. Sepsis Labs: No results for input(s): PROCALCITON, LATICACIDVEN in the last 168 hours.  Recent Results (from the past  240 hour(s))  Culture, Urine     Status: None   Collection Time: 09/13/16 11:16 AM  Result Value Ref Range Status   Specimen Description URINE, CLEAN CATCH  Final   Special Requests NONE  Final   Culture   Final    NO GROWTH Performed at [facility], [address] [place],  [address]    Report Status 09/14/2016 FINAL  Final         Radiology Studies: Ct Abdomen Pelvis Wo Contrast  Result Date: 09/15/2016 CLINICAL DATA:  Lower abdominal pain for 4 days. EXAM: CT ABDOMEN AND PELVIS WITHOUT CONTRAST TECHNIQUE: Multidetector CT imaging of the abdomen and pelvis was performed following the standard protocol without IV contrast. COMPARISON:  None. FINDINGS: Lower chest: Small bilateral pleural effusions. Moderate cardiac enlargement. Hepatobiliary: Hypertrophy of the lateral segment of left lobe and caudate lobe of liver noted. No focal liver abnormality. Stone within the gallbladder measures 7 mm. No ductal dilatation identified. Pancreas: Unremarkable. No  pancreatic ductal dilatation or surrounding inflammatory changes. Spleen: Normal in size without focal abnormality. 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 Normal appearance of the right kidney. No mass or hydronephrosis. Indeterminate exophytic low-attenuation structure arising from the inferior pole the left kidney measures 1.3 cm peer incompletely characterized without IV contrast. No left-sided hydronephrosis. Urinary bladder appears normal. Stomach/Bowel: The stomach appears normal. The small bowel loops have a normal course and caliber without obstruction. No pathologic dilatation of the colon. Vascular/Lymphatic: Aortic atherosclerosis. No enlarged mesenteric or retroperitoneal lymph nodes. There is no pelvic or inguinal adenopathy. Reproductive: Prostate is unremarkable. Other: There is a small to moderate amount of ascites identified within the abdomen extending over the liver an along the left pericolic gutter. No focal fluid collections identified. Mild diffuse body wall edema identified. Musculoskeletal: No acute or significant osseous findings. Degenerative disc disease is identified. IMPRESSION: 1. Cardiac enlargement, Small pleural effusions, abdominal ascites and mild body wall edema. Could this patient have congestive heart failure? 2. Right adrenal gland myelolipoma 3. Aortic atherosclerosis. 4. Gallstone. Electronically Signed   By: Kerby Moors M.D.   On: 09/15/2016 15:54        Scheduled Meds: . aspirin  325 mg Oral Daily  . carvedilol  6.25 mg Oral BID WC  . clopidogrel  75 mg Oral Daily  . enoxaparin (LOVENOX) injection  70 mg Subcutaneous Q24H  . fluticasone  2 spray Each Nare Daily  . furosemide  80 mg Intravenous TID  . naloxegol oxalate  12.5 mg Oral Daily  . polycarbophil  1,250 mg Oral Q lunch  . [START ON 09/17/2016] potassium chloride  40 mEq Oral Daily  . rosuvastatin  40 mg Oral q1800  .  senna-docusate  2 tablet Oral BID   Continuous Infusions:   LOS: 5 days    Zhion Pevehouse Tanna Furry, MD [facility] Pager [phone]  If 7PM-7AM, please contact night-coverage [email or web address] Password [email or web address] 09/16/2016, 11:17 AM

## 2016-09-17 DIAGNOSIS — N179 Acute kidney failure, unspecified: Secondary | ICD-10-CM

## 2016-09-17 LAB — BASIC METABOLIC PANEL
ANION GAP: 9 (ref 5–15)
BUN: 39 mg/dL — AB (ref 6–20)
CALCIUM: 9.4 mg/dL (ref 8.9–10.3)
CO2: 35 mmol/L — AB (ref 22–32)
Chloride: 89 mmol/L — ABNORMAL LOW (ref 101–111)
Creatinine, Ser: 1.56 mg/dL — ABNORMAL HIGH (ref 0.61–1.24)
GFR calc Af Amer: 52 mL/min — ABNORMAL LOW (ref >60)
GFR calc non Af Amer: 45 mL/min — ABNORMAL LOW (ref >60)
GLUCOSE: 140 mg/dL — AB (ref 65–99)
Potassium: 4.2 mmol/L (ref 3.5–5.1)
Sodium: 133 mmol/L — ABNORMAL LOW (ref 135–145)

## 2016-09-17 LAB — MAGNESIUM: Magnesium: 2.5 mg/dL — ABNORMAL HIGH (ref 1.7–2.4)

## 2016-09-17 MED ORDER — NITROGLYCERIN 0.4 MG SL SUBL
SUBLINGUAL_TABLET | SUBLINGUAL | Status: AC
  Administered 2016-09-17: 0.4 mg via SUBLINGUAL
  Filled 2016-09-17: qty 1

## 2016-09-17 MED ORDER — NITROGLYCERIN 0.4 MG SL SUBL
0.4000 mg | SUBLINGUAL_TABLET | SUBLINGUAL | Status: DC | PRN
  Administered 2016-09-17: 0.4 mg via SUBLINGUAL

## 2016-09-17 NOTE — Progress Notes (Signed)
At 2250, Patient c/o chest pain, asking for Nitroglycerin that he takes at home. Patient sitting on edge of bed, agitated. Not answering questions of description of pain/symptoms. VS per flow sheet. Valentino Nose, Agricultural consultant notified and in room. Dr. Hilbert Bible paged. Patient given Nitro 0.4mg  PO at 2252 per protocol. O2 3L [place] applied per patient request. Within 3 minutes, patient stated relief. Stat EKG done. Recheck VS per flow sheet. Patient resting in bed at this time, denies needs.

## 2016-09-17 NOTE — Progress Notes (Signed)
Patient ID: [name] [name], male   DOB: 25-Sep-1950, 66 y.o.   MRN: [id number]   Assessment/Plan: ADMITTED WITH CHF EXACERBATION COMPLICATED BY CONSTIPATION. CLINICALLY IMPROVED.  PLAN: 1. CONTINUE [place]. HOLD FOR DIARRHEA. 2. CONTINUE FIBERCON.    Subjective: Since I last evaluated the patient HE HAD A SMALL BM TODAY. PASSING GAS. WANTS TO GO HOME.  Objective: Vital signs in last 24 hours: Vitals:   09/16/16 1838 09/17/16 0557  BP: (!) 147/97 107/73  Pulse: 80 74  Resp:  18  Temp:  97.8 F (36.6 C)     General appearance: alert, cooperative and no distress Resp: clear to auscultation bilaterally Cardio: regular rate and rhythm GI: soft, non-tender; bowel sounds normal; no masses,  no organomegaly  Lab Results:  K 4.2 Cr 1.56 GFR 45   Studies/Results: No results found.  Medications: I have reviewed the patient's current medications.   LOS: 5 days   Barney Drain 02/05/2014, 2:23 PM

## 2016-09-17 NOTE — Progress Notes (Signed)
PROGRESS NOTE    [name] [name]  PYP:[id number] DOB: 1950/11/22 DOA: 09/11/2016 PCP: Sallee Lange, MD   Brief Narrative: 66 y.o.malewith medical history significant of morbid obesity, OSA (not yet on CPAP), HLD, HTN, diabetes, CAD s/p 6v CABG in 2001 and subsequent PCI, AICD, and CHF (05/08/16 Echo with EF 15% and grade 3 diastolic dysfunction), admitted for worsening lower extremity edema, shortness of breath consistent with CHF despite taking increased dose Lasix.  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 -Patient has gross anasarca.  -Responding with higher dose of IV Lasix. Currently on IV Lasix 80 mg 3 times a day. Patient is net negative by about 5 L.  - I discussed with the cardiologist at [facility] and spoke with Dr.Skains on 1/20. -Education provided to the patient regarding importance of IV diuretics and need to monitor kidney function at the same time. He verbalized understanding. Follow up with the cardiologist. -ACEI on hold because of labile renal function. Continue to hold. -Repeat echocardiogram with EF of 10-15% and moderate AS. -Cardiology consult appreciated.   #Acute kidney injury likely hemodynamically mediated in the setting of diuretics and ACEI and congestive heart failure.  -Serum creatinine level is stable today. Continue current diuretics regimen. Close monitoring of electrolytes and kidney function. Patient and his wife understand the adverse effect of diuretics including worsening kidney function. I discussed with them in detail at bedside.  #Hypokalemia in the setting of diuretics: Serum potassium level acceptable. Continue oral potassium supplement. Monitor labs.  #Dyslipidemia: Crestor  #Coronary artery disease: No chest pain. Currently on aspirin Plavix, Coreg and diuretics.  #Constipation. Improved now. Continue bowel regimen. GI consult appreciated.   Principal  Problem:   Acute on chronic congestive heart failure (HCC) Active Problems:   Hyperlipemia   Morbid obesity (HCC)   Obstructive sleep apnea   Diabetes mellitus, type 2 (HCC)   Essential hypertension   Prolonged Q-T interval on ECG   Hypokalemia   Anasarca   Constipation   Difficulty in walking, not elsewhere classified   Pain in the abdomen   DVT prophylaxis: Lovenox subcutaneous. Education provided to the patient regarding importance of subcutaneous Lovenox in order to prevent blood clot. Code Status: Partial DO NOT RESUSCITATE Family Communication: Discussed with the patient and his wife at bedside in detail. Disposition Plan: Likely discharge home in 1-2 days. Still needs IV diuretics.   Consultants:   Cardiologist  GI consult requested  Procedures: Echocardiogram Antimicrobials: None  Subjective: Patient was seen and examined at bedside. Patient is asking when he can go home. Education provided to the patient about her current clinical condition and plan of care. Wife at bedside. Verbalized understanding. Denied headache, dizziness, nausea, vomiting, chest or shortness of breath. No constipation.  Objective: Vitals:   09/16/16 1133 09/16/16 1432 09/16/16 1838 09/17/16 0557  BP: (!) 120/100 99/63 (!) 147/97 107/73  Pulse: (!) 139 78 80 74  Resp:  18  18  Temp:  98 F (36.7 C)  97.8 F (36.6 C)  TempSrc:  Oral  Oral  SpO2:  96%  93%  Weight:    (!) 137.4 kg (302 lb 14.6 oz)  Height:        Intake/Output Summary (Last 24 hours) at 09/17/16 1203 Last data filed at 09/17/16 1200  Gross per 24 hour  Intake              480 ml  Output             2225 ml  Net            -1745 ml   Filed Weights   09/15/16 0517 09/16/16 0700 09/17/16 0557  Weight: (!) 141 kg (310 lb 12.8 oz) (!) 141.3 kg (311 lb 9.6 oz) (!) 137.4 kg (302 lb 14.6 oz)    Examination:  General exam: Alert, awake, following, and. Not in distress. Respiratory system: Clear bilaterally, no  wheezing or crackle. Cardiovascular system: Regular rate and rhythm, S1-S2 normal. Gastrointestinal system: Abdomen is firm, distended, nontender. Bowel sound positive. Unchanged Central nervous system: Alert awake and following commands. No focal neurological deficit. Extremities: Symmetric 5 x 5 power. Bilateral lower extremities pitting edema. Skin: No rashes, lesions or ulcers Psychiatry: Judgement and insight appear normal. Mood & affect appropriate.     Data Reviewed: I have personally reviewed following labs and imaging studies  CBC:  Recent Labs Lab 09/11/16 1524 09/12/16 0320  WBC 11.6* 10.8*  NEUTROABS 9.0* 8.4*  HGB 15.5 14.1  HCT 47.5 42.9  MCV 95.0 94.5  PLT 256 989   Basic Metabolic Panel:  Recent Labs Lab 09/13/16 0452 09/14/16 0513 09/14/16 1712 09/15/16 0912 09/16/16 0639 09/17/16 0625  NA 135 135  --  133* 134* 133*  K 3.2* 3.2*  --  4.4 4.2 4.2  CL 93* 92*  --  93* 92* 89*  CO2 32 32  --  32 33* 35*  GLUCOSE 175* 155*  --  181* 143* 140*  BUN 24* 30*  --  34* 38* 39*  CREATININE 1.19 1.35*  --  1.33* 1.52* 1.56*  CALCIUM 8.2* 7.9*  --  8.4* 9.0 9.4  MG 2.0  --  2.3  --  2.4 2.5*   GFR: Estimated Creatinine Clearance: 66.4 mL/min (by C-G formula based on SCr of 1.56 mg/dL (H)). Liver Function Tests:  Recent Labs Lab 09/16/16 0639  PROT 7.1  ALBUMIN 3.6   No results for input(s): LIPASE, AMYLASE in the last 168 hours. No results for input(s): AMMONIA in the last 168 hours. Coagulation Profile: No results for input(s): INR, PROTIME in the last 168 hours. Cardiac Enzymes:  Recent Labs Lab 09/11/16 1524 09/11/16 2153 09/12/16 0320 09/12/16 0905  TROPONINI 0.03* <0.03 <0.03 <0.03   BNP (last 3 results) No results for input(s): PROBNP in the last 8760 hours. HbA1C: No results for input(s): HGBA1C in the last 72 hours. CBG:  Recent Labs Lab 09/12/16 0731  GLUCAP 160*   Lipid Profile: No results for input(s): CHOL, HDL,  LDLCALC, TRIG, CHOLHDL, LDLDIRECT in the last 72 hours. Thyroid Function Tests: No results for input(s): TSH, T4TOTAL, FREET4, T3FREE, THYROIDAB in the last 72 hours. Anemia Panel: No results for input(s): VITAMINB12, FOLATE, FERRITIN, TIBC, IRON, RETICCTPCT in the last 72 hours. Sepsis Labs: No results for input(s): PROCALCITON, LATICACIDVEN in the last 168 hours.  Recent Results (from the past 240 hour(s))  Culture, Urine     Status: None   Collection Time: 09/13/16 11:16 AM  Result Value Ref Range Status   Specimen Description URINE, CLEAN CATCH  Final   Special Requests NONE  Final   Culture   Final    NO GROWTH Performed at [facility], [address] [place], [place] [address]    Report Status 09/14/2016 FINAL  Final         Radiology Studies: Ct Abdomen Pelvis Wo Contrast  Result Date: 09/15/2016 CLINICAL  DATA:  Lower abdominal pain for 4 days. EXAM: CT ABDOMEN AND PELVIS WITHOUT CONTRAST TECHNIQUE: Multidetector CT imaging of the abdomen and pelvis was performed following the standard protocol without IV contrast. COMPARISON:  None. FINDINGS: Lower chest: Small bilateral pleural effusions. Moderate cardiac enlargement. Hepatobiliary: Hypertrophy of the lateral segment of left lobe and caudate lobe of liver noted. No focal liver abnormality. Stone within the gallbladder measures 7 mm. No ductal dilatation identified. Pancreas: Unremarkable. No pancreatic ductal dilatation or surrounding inflammatory changes. Spleen: Normal in size without focal abnormality. 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 Normal appearance of the right kidney. No mass or hydronephrosis. Indeterminate exophytic low-attenuation structure arising from the inferior pole the left kidney measures 1.3 cm peer incompletely characterized without IV contrast. No left-sided hydronephrosis. Urinary bladder appears normal. Stomach/Bowel: The  stomach appears normal. The small bowel loops have a normal course and caliber without obstruction. No pathologic dilatation of the colon. Vascular/Lymphatic: Aortic atherosclerosis. No enlarged mesenteric or retroperitoneal lymph nodes. There is no pelvic or inguinal adenopathy. Reproductive: Prostate is unremarkable. Other: There is a small to moderate amount of ascites identified within the abdomen extending over the liver an along the left pericolic gutter. No focal fluid collections identified. Mild diffuse body wall edema identified. Musculoskeletal: No acute or significant osseous findings. Degenerative disc disease is identified. IMPRESSION: 1. Cardiac enlargement, Small pleural effusions, abdominal ascites and mild body wall edema. Could this patient have congestive heart failure? 2. Right adrenal gland myelolipoma 3. Aortic atherosclerosis. 4. Gallstone. Electronically Signed   By: Kerby Moors M.D.   On: 09/15/2016 15:54        Scheduled Meds: . aspirin  325 mg Oral Daily  . carvedilol  6.25 mg Oral BID WC  . clopidogrel  75 mg Oral Daily  . enoxaparin (LOVENOX) injection  70 mg Subcutaneous Q24H  . fluticasone  2 spray Each Nare Daily  . furosemide  80 mg Intravenous TID  . naloxegol oxalate  12.5 mg Oral Daily  . polycarbophil  1,250 mg Oral Q lunch  . potassium chloride  40 mEq Oral Daily  . rosuvastatin  40 mg Oral q1800  . senna-docusate  2 tablet Oral BID   Continuous Infusions:   LOS: 6 days    Maryama Kuriakose Tanna Furry, MD [facility] Pager [phone]  If 7PM-7AM, please contact night-coverage [email or web address] Password [facility] 09/17/2016, 12:03 PM

## 2016-09-18 ENCOUNTER — Telehealth: Payer: Self-pay | Admitting: Gastroenterology

## 2016-09-18 DIAGNOSIS — E782 Mixed hyperlipidemia: Secondary | ICD-10-CM

## 2016-09-18 DIAGNOSIS — K5900 Constipation, unspecified: Secondary | ICD-10-CM

## 2016-09-18 DIAGNOSIS — N183 Chronic kidney disease, stage 3 unspecified: Secondary | ICD-10-CM

## 2016-09-18 DIAGNOSIS — I255 Ischemic cardiomyopathy: Secondary | ICD-10-CM

## 2016-09-18 LAB — BASIC METABOLIC PANEL
Anion gap: 11 (ref 5–15)
BUN: 38 mg/dL — ABNORMAL HIGH (ref 6–20)
CO2: 35 mmol/L — ABNORMAL HIGH (ref 22–32)
Calcium: 8.5 mg/dL — ABNORMAL LOW (ref 8.9–10.3)
Chloride: 89 mmol/L — ABNORMAL LOW (ref 101–111)
Creatinine, Ser: 1.51 mg/dL — ABNORMAL HIGH (ref 0.61–1.24)
GFR calc Af Amer: 54 mL/min — ABNORMAL LOW (ref >60)
GFR calc non Af Amer: 47 mL/min — ABNORMAL LOW (ref >60)
Glucose, Bld: 198 mg/dL — ABNORMAL HIGH (ref 65–99)
Potassium: 4.1 mmol/L (ref 3.5–5.1)
Sodium: 135 mmol/L (ref 135–145)

## 2016-09-18 LAB — MAGNESIUM: Magnesium: 2.2 mg/dL (ref 1.7–2.4)

## 2016-09-18 MED ORDER — CARVEDILOL 6.25 MG PO TABS
6.2500 mg | ORAL_TABLET | Freq: Two times a day (BID) | ORAL | 0 refills | Status: DC
Start: 2016-09-18 — End: 2017-04-24

## 2016-09-18 MED ORDER — POTASSIUM CHLORIDE CRYS ER 20 MEQ PO TBCR: 20.0000 meq | EXTENDED_RELEASE_TABLET | Freq: Every day | ORAL | 0 refills | Status: DC

## 2016-09-18 MED ORDER — CALCIUM POLYCARBOPHIL 625 MG PO TABS: 1250.0000 mg | ORAL_TABLET | Freq: Every day | ORAL | 0 refills | Status: DC

## 2016-09-18 MED ORDER — FUROSEMIDE 40 MG PO TABS: 80.0000 mg | ORAL_TABLET | Freq: Two times a day (BID) | ORAL | 0 refills | Status: DC

## 2016-09-18 NOTE — Progress Notes (Signed)
    Subjective: Two bowel movements yesterday evening. No abdominal pain. States he is tired and wants to rest. Events overnight with chest pain reviewed noted. Patient denies any further chest pain at time of visit this morning.   Objective: Vital signs in last 24 hours: Temp:  [97.8 F (36.6 C)-98.7 F (37.1 C)] 98.6 F (37 C) (01/22 0605) Pulse Rate:  [73-80] 76 (01/22 0605) Resp:  [18] 18 (01/22 0605) BP: (91-125)/(61-78) 91/61 (01/22 0605) SpO2:  [97 %-100 %] 97 % (01/22 0605) Weight:  [297 lb (134.7 kg)] 297 lb (134.7 kg) (01/22 0605) Last BM Date: 09/17/16 General:   Alert and oriented, pleasant Abdomen:  Bowel sounds present, obese, non-tender. No rebound or guarding. Anasarca noted.  Extremities:  With pedal edema, lower extremity edema Neurologic:  Alert and  oriented x4 Psych:  Alert and cooperative. Normal mood and affect.  Intake/Output from previous day: 01/21 0701 - 01/22 0700 In: 660 [P.O.:660] Out: 2350 [Urine:2350] Intake/Output this shift: No intake/output data recorded.  Lab Results: No results for input(s): WBC, HGB, HCT, PLT in the last 72 hours. BMET  Recent Labs  09/16/16 0639 09/17/16 0625 09/18/16 0510  NA 134* 133* 135  K 4.2 4.2 4.1  CL 92* 89* 89*  CO2 33* 35* 35*  GLUCOSE 143* 140* 198*  BUN 38* 39* 38*  CREATININE 1.52* 1.56* 1.51*  CALCIUM 9.0 9.4 8.5*   LFT  Recent Labs  09/16/16 0639  PROT 7.1  ALBUMIN 3.6    Assessment: 66 year old male admitted with CHF exacerbation, constipation. No prior colonoscopy. Doing well on Movantik 12.5, fibercon and Senna BID. Monitor for loose stools. May remain on Movantik as long as he is on opioid therapy daily prn. If this changes in the future, will need to trial Linzess or Amitiza. Will follow peripherally and arrange outpatient follow-up in our office to discuss elective colonoscopy for screening purposes.   Plan: Continue Movantik 12.5 mg daily while on opioid therapy Will follow  peripherally Follow-up as outpatient for elective screening colonoscopy   Annitta Needs, ANP-BC [facility]     LOS: 7 days    09/18/2016, 7:43 AM

## 2016-09-18 NOTE — Progress Notes (Signed)
Dr. Carolin Sicks in room assessing pt as this nurse is passing morning meds. Pt repeatedly makes rude comments regarding doctor's nationality and ability to speak [place]. Dr. Carolin Sicks remained polite and professional during visit and continuously redirected pt's inappropriate comments back to the plan of care.

## 2016-09-18 NOTE — Progress Notes (Signed)
Progress Note  Patient Name: [name] [name] Date of Encounter: 09/18/2016  Consulting Cardiologist: Dr. Carlyle Dolly  Subjective   Wants to go home. Reports no chest pain or breathlessness at rest.  Inpatient Medications    Scheduled Meds: . aspirin  325 mg Oral Daily  . carvedilol  6.25 mg Oral BID WC  . clopidogrel  75 mg Oral Daily  . enoxaparin (LOVENOX) injection  70 mg Subcutaneous Q24H  . fluticasone  2 spray Each Nare Daily  . furosemide  80 mg Intravenous TID  . naloxegol oxalate  12.5 mg Oral Daily  . polycarbophil  1,250 mg Oral Q lunch  . potassium chloride  40 mEq Oral Daily  . rosuvastatin  40 mg Oral q1800  . senna-docusate  2 tablet Oral BID    PRN Meds: acetaminophen, alum & mag hydroxide-simeth, bisacodyl, HYDROcodone-acetaminophen, nitroGLYCERIN, ondansetron (ZOFRAN) IV, oxymetazoline, polyethylene glycol   Vital Signs    Vitals:   09/17/16 2126 09/17/16 2250 09/17/16 2304 09/18/16 0605  BP: 125/78 109/70 102/66 91/61  Pulse: 80 78 73 76  Resp: 18   18  Temp: 97.8 F (36.6 C)   98.6 F (37 C)  TempSrc: Oral   Oral  SpO2: 100% 100% 100% 97%  Weight:    297 lb (134.7 kg)  Height:        Intake/Output Summary (Last 24 hours) at 09/18/16 1003 Last data filed at 09/18/16 0844  Gross per 24 hour  Intake              783 ml  Output             2150 ml  Net            -1367 ml   Filed Weights   09/16/16 0700 09/17/16 0557 09/18/16 0605  Weight: (!) 311 lb 9.6 oz (141.3 kg) (!) 302 lb 14.6 oz (137.4 kg) 297 lb (134.7 kg)    Telemetry    I personally reviewed telemetry which shows sinus rhythm with occasional PVC.  ECG    I personally reviewed the tracing from 09/17/2016 which shows sinus rhythm with borderline prolonged PR interval, poor R-wave progression, PVC, rightward axis.  Physical Exam   GEN: Morbidly obese. No acute distress.  Neck: No JVD Cardiac:  Indistinct PMI, RRR, no gallops. Respiratory: Clear to auscultation  bilaterally. GI: Soft, nontender.  MS: Lower leg edema; No deformity.  Labs    Chemistry Recent Labs Lab 09/16/16 0639 09/17/16 0625 09/18/16 0510  NA 134* 133* 135  K 4.2 4.2 4.1  CL 92* 89* 89*  CO2 33* 35* 35*  GLUCOSE 143* 140* 198*  BUN 38* 39* 38*  CREATININE 1.52* 1.56* 1.51*  CALCIUM 9.0 9.4 8.5*  PROT 7.1  --   --   ALBUMIN 3.6  --   --   GFRNONAA 46* 45* 47*  GFRAA 54* 52* 54*  ANIONGAP 9 9 11      Hematology Recent Labs Lab 09/11/16 1524 09/12/16 0320  WBC 11.6* 10.8*  RBC 5.00 4.54  HGB 15.5 14.1  HCT 47.5 42.9  MCV 95.0 94.5  MCH 31.0 31.1  MCHC 32.6 32.9  RDW 14.9 15.0  PLT 256 261    Cardiac Enzymes Recent Labs Lab 09/11/16 1524 09/11/16 2153 09/12/16 0320 09/12/16 0905  TROPONINI 0.03* <0.03 <0.03 <0.03   No results for input(s): TROPIPOC in the last 168 hours.   BNP Recent Labs Lab 09/11/16 1524  BNP 733.0*  Radiology    No results found.  Cardiac Studies   Echocardiogram 09/12/2016: Study Conclusions  - Left ventricle: The cavity size was normal. Wall thickness was   normal. The estimated ejection fraction was in the range of 10%   to 15%. Diffuse hypokinesis. Doppler parameters are consistent   with restrictive physiology, indicative of decreased left   ventricular diastolic compliance and/or increased left atrial   pressure. Doppler parameters are consistent with high ventricular   filling pressure. - Aortic valve: Moderately calcified annulus. Moderately thickened   leaflets. There was moderate stenosis. AV gradients decreased in   the setting of severe LV systolic dysfunction. AVA VTI and   dimensionless index support moderate aortic stenosis. Mean   gradient (S): 17 mm Hg. Valve area (VTI): 1.2 cm^2. Valve area   (Vmax): 1.1 cm^2. - Left atrium: The atrium was severely dilated. - Pulmonary arteries: Systolic pressure was moderately increased.   PA peak pressure: 58 mm Hg (S). - Inferior vena cava: The vessel  was dilated. The respirophasic   diameter changes were blunted (< 50%), consistent with elevated   central venous pressure. - Technically difficult study. Echocontrast was used to enhance   visualization.  Patient Profile     66 y.o. male with ischemic cardiomyopathy, LVEF 10-15%, previous CABG in 2001, OSA, ICD in place, and CKD stage III. Currently admitted with acute on chronic combined heart failure. He has had clinical improvement on high-dose IV Lasix with decrease in weight from 314 down to 297. Follow-up echocardiogram outlined above.  Assessment & Plan    1. Acute on chronic combined heart failure. Weight has decreased approximately 15 pounds with IV Lasix. He feels better and would like to go home. Chart reviewed, he has seen various providers previously including Dr. [name] and Dr. Johnsie Cancel (neither recently), no regular CHF follow-up however. EP physician is Dr. Curt Bears. He would like to have follow-up in [place] office if possible, does not want to see Dr. [name]. Plan to schedule a visit with Dr. Harl Bowie who evaluated him over the last week. He may actually be a good candidate for the CHF clinic with interval visits here in [place].  2. Ischemic myopathy with LVEF 10-15% and above.  3. CAD status post CABG in 2001.  4. [employer] ICD in place, followed by Dr. Curt Bears.  5. CKD, stage 3, creatinine 1.5.  Patient for discharge home today. Recommend Lasix 80 mg twice daily for now as outpatient. Otherwise continue aspirin, Coreg, Plavix, Crestor, and potassium supplements. No ACE inhibitor or ARB at this time. Scheduling follow-up visit with Dr. Harl Bowie to establish more regular CHF management in [place] with consideration of having him seen intermittently in the CHF clinic.  Signed, Rozann Lesches, MD  09/18/2016, 10:03 AM

## 2016-09-18 NOTE — Telephone Encounter (Signed)
APPT MADE AND NURSE ON 300 WILL LET HIM KNOW

## 2016-09-18 NOTE — Discharge Summary (Addendum)
Physician Discharge Summary  [name] [name] CNO:[id number] DOB: [birth year] DOA: 09/11/2016  PCP: Sallee Lange, MD  Admit date: 09/11/2016 Discharge date: 09/18/2016  Admitted From:Home Disposition:home  Recommendations for Outpatient Follow-up:  1. Follow up with PCP in 1-2 weeks 2. Please obtain BMP/CBC in one week   Home Health:no Equipment/Devices:no Discharge Condition:stable CODE STATUS:partial DNR with no intubation. Diet recommendation:heart healthy  Brief/Interim Summary:65 y.o.malewith medical history significant of morbid obesity, OSA (not yet on CPAP), HLD, HTN, diabetes, CAD s/p 6v CABG in 2001 and subsequent PCI, AICD, and CHF (05/08/16 Echo with EF 15% and grade 3 diastolic dysfunction), admitted for worsening lower extremity edema, shortness of breath consistent with CHF despite taking increased dose Lasix.  #Acute on chronic combined left (systolic and diastolic) and right sided heart failure: EF of 15%. -Patient with a gross anasarca on admission. Treated with IV Lasix with improvement in symptoms. He has lost at least 10 pound of weight and negative by almost 6 L off total fluid. He has been asking to go home and very eager to go home today. He still has lower leg pitting edema but feels much better than before. I discussed with him and with the cardiologist in detail. Plan to discharge him with oral Lasix twice a day. Education provided to the patient regarding low salt diet, daily weight monitoring, importance of checking repeat lab for serum potassium level and kidney function. Also recommended to follow-up with the cardiologist as an outpatient.  -Patient is on Coreg, Lasix. -ACEI on hold because of labile renal function. Continue to hold. -Repeat echocardiogram with EF of 10-15% and moderate AS. -Cardiology consult appreciated.   #Acute kidney injury likely hemodynamically mediated in the setting of diuretics and ACEI and congestive heart failure.  -Serum  creatinine level is stable at 1.5, which might the patient's new baseline. Patient needs diuretics for CHF management. I recommended to monitor kidney function and close outpatient follow-up.  #Hypokalemia in the setting of diuretics: Serum potassium level acceptable. Continue oral potassium supplement. Monitor labs.  #Dyslipidemia: Crestor  #Coronary artery disease: No chest pain. Currently on aspirin Plavix, Coreg and diuretics.  #Constipation. Improved now. Continue bowel regimen. Recommended outpatient follow-up with GI and possibly colonoscopy screening.  Patient wanted to go home today and understand outpatient follow-up. Offered discussion with the cardiologist, plan is made to send him with oral Lasix and outpatient follow-up. Patient is weaned off oxygen to room air.  He is not hypoxic.  Patient denied headache, dizziness, nausea, vomiting, chest pain or shortness of breath. No urinary complaints.  Discharge Diagnoses:  Principal Problem:   Acute on chronic congestive heart failure (HCC) Active Problems:   Hyperlipemia   Morbid obesity (HCC)   Obstructive sleep apnea   Diabetes mellitus, type 2 (HCC)   Essential hypertension   Prolonged Q-T interval on ECG   Hypokalemia   Anasarca   Constipation   Difficulty in walking, not elsewhere classified   Pain in the abdomen   Acute kidney injury ([place])   CKD (chronic kidney disease) stage 3, GFR 30-59 ml/min    Discharge Instructions  Discharge Instructions    (HEART FAILURE PATIENTS) Call MD:  Anytime you have any of the following symptoms: 1) 3 pound weight gain in 24 hours or 5 pounds in 1 week 2) shortness of breath, with or without a dry hacking cough 3) swelling in the hands, feet or stomach 4) if you have to sleep on extra pillows at night in order to breathe.  Complete by:  As directed    Call MD for:  difficulty breathing, headache or visual disturbances    Complete by:  As directed    Call MD for:  extreme  fatigue    Complete by:  As directed    Call MD for:  hives    Complete by:  As directed    Call MD for:  persistant dizziness or light-headedness    Complete by:  As directed    Call MD for:  persistant nausea and vomiting    Complete by:  As directed    Call MD for:  severe uncontrolled pain    Complete by:  As directed    Call MD for:  temperature >100.4    Complete by:  As directed    Diet - low sodium heart healthy    Complete by:  As directed    Discharge instructions    Complete by:  As directed    1. Please check daily weight 2. Please follow up with the cardiologist, gastroenterologist and PCP. 3. Please check BMP to monitor serum potassium level and kidney function in 1 week either with your PCP or cardiologist.   Increase activity slowly    Complete by:  As directed      Allergies as of 09/18/2016      Reactions   Dyflex-g [dyphylline-guaifenesin] Hives      Medication List    STOP taking these medications   lisinopril 5 MG tablet Commonly known as:  PRINIVIL,ZESTRIL     TAKE these medications   aspirin 325 MG tablet Take 325 mg by mouth daily.   carvedilol 6.25 MG tablet Commonly known as:  COREG Take 1 tablet (6.25 mg total) by mouth 2 (two) times daily with a meal.   CENTRUM SILVER PO Take 1 tablet by mouth daily.   clopidogrel 75 MG tablet Commonly known as:  PLAVIX Take 1 tablet (75 mg total) by mouth daily.   furosemide 40 MG tablet Commonly known as:  LASIX Take 2 tablets (80 mg total) by mouth 2 (two) times daily. What changed:  how much to take  when to take this  reasons to take this   HYDROcodone-acetaminophen 10-325 MG tablet Commonly known as:  NORCO Take 1 tablet by mouth every 8 (eight) hours as needed. What changed:  reasons to take this   nitroGLYCERIN 0.4 MG SL tablet Commonly known as:  NITROSTAT PLACE 1 TABLET UNDER TONGUE FOR CHEST PAIN. MAY REPEAT EVERY 5 MIN UPTO 3 DOSES-NO RELIEF,CALL 911.   polycarbophil 625 MG  tablet Commonly known as:  FIBERCON Take 2 tablets (1,250 mg total) by mouth daily with lunch. Hold if you have diarrhea   potassium chloride SA 20 MEQ tablet Commonly known as:  K-DUR,KLOR-CON Take 1 tablet (20 mEq total) by mouth daily. Start taking on:  09/19/2016   rosuvastatin 40 MG tablet Commonly known as:  CRESTOR Take 1 tablet (40 mg total) by mouth daily.   trolamine salicylate 10 % cream Commonly known as:  ASPERCREME Apply 1 application topically as needed for muscle pain.      Follow-up Information    Carlyle Dolly, MD Follow up on 10/12/2016.   Specialty:  Cardiology Why:  1:40 pm Contact information: [place] [address] [phone]        Sallee Lange, MD. Schedule an appointment as soon as possible for a visit in 1 week(s).   Specialty:  Family Medicine Contact information: [address]  [name] [address] [phone]        Sandi Fields, MD. Schedule an appointment as soon as possible for a visit in 1 month(s).   Specialty:  Gastroenterology Why:  as needed for constipation  Contact information: [name] [address] [phone]          Allergies  Allergen Reactions  . Dyflex-G [Dyphylline-Guaifenesin] Hives    Consultations: Cardiologist and gastroenterologist  Procedures/Studies: Echocardiogram  Subjective: Patient was seen and examined at bedside. Patient reported much better and wanted to go home today. He denied fever, chills, headache, dizziness, nausea, vomiting, chest pain, shortness of breath. No urinary complaints. During last fewdays and today he was questioning my [place] proficiency, country of origin and quizzing me with multiple English Slangs. He was intermittently rude with multiple healthcare providers including nursing staffs.  Discharge Exam: Vitals:   09/17/16 2304 09/18/16 0605  BP: 102/66 91/61  Pulse: 73 76  Resp:  18  Temp:  98.6 F (37 C)   Vitals:    09/17/16 2304 09/18/16 0605 09/18/16 1048 09/18/16 1200  BP: 102/66 91/61    Pulse: 73 76    Resp:  18    Temp:  98.6 F (37 C)    TempSrc:  Oral    SpO2: 100% 97% 100% 99%  Weight:  134.7 kg (297 lb)    Height:        General: Pt is alert, awake, not in acute distress Cardiovascular: RRR, S1/S2 +, no rubs, no gallops Respiratory: CTA bilaterally, no wheezing, no rhonchi Abdominal: Soft, NT,  bowel sounds + Extremities: Bilateral lower extremities pitting edema mildly improved from before, no cyanosis Neurology: Alert, awake, oriented 3. Muscle strength 5 over 5 in all extremities. Nonfocal neurological exam.    The results of significant diagnostics from this hospitalization (including imaging, microbiology, ancillary and laboratory) are listed below for reference.     Microbiology: Recent Results (from the past 240 hour(s))  Culture, Urine     Status: None   Collection Time: 09/13/16 11:16 AM  Result Value Ref Range Status   Specimen Description URINE, CLEAN CATCH  Final   Special Requests NONE  Final   Culture   Final    NO GROWTH Performed at [facility], [address] [place], [place] [address]    Report Status 09/14/2016 FINAL  Final     Labs: BNP (last 3 results)  Recent Labs  10/22/15 0413 09/11/16 1524  BNP 547.7* 759.1*   Basic Metabolic Panel:  Recent Labs Lab 09/13/16 0452 09/14/16 0513 09/14/16 1712 09/15/16 0912 09/16/16 0639 09/17/16 0625 09/18/16 0510  NA 135 135  --  133* 134* 133* 135  K 3.2* 3.2*  --  4.4 4.2 4.2 4.1  CL 93* 92*  --  93* 92* 89* 89*  CO2 32 32  --  32 33* 35* 35*  GLUCOSE 175* 155*  --  181* 143* 140* 198*  BUN 24* 30*  --  34* 38* 39* 38*  CREATININE 1.19 1.35*  --  1.33* 1.52* 1.56* 1.51*  CALCIUM 8.2* 7.9*  --  8.4* 9.0 9.4 8.5*  MG 2.0  --  2.3  --  2.4 2.5* 2.2   Liver Function Tests:  Recent Labs Lab 09/16/16 0639  PROT 7.1  ALBUMIN 3.6   No results for input(s): LIPASE, AMYLASE in the  last 168 hours. No results for input(s): AMMONIA in the last 168 hours. CBC:  Recent Labs  Lab 09/11/16 1524 09/12/16 0320  WBC 11.6* 10.8*  NEUTROABS 9.0* 8.4*  HGB 15.5 14.1  HCT 47.5 42.9  MCV 95.0 94.5  PLT 256 261   Cardiac Enzymes:  Recent Labs Lab 09/11/16 1524 09/11/16 2153 09/12/16 0320 09/12/16 0905  TROPONINI 0.03* <0.03 <0.03 <0.03   BNP: Invalid input(s): POCBNP CBG:  Recent Labs Lab 09/12/16 0731  GLUCAP 160*   D-Dimer No results for input(s): DDIMER in the last 72 hours. Hgb A1c No results for input(s): HGBA1C in the last 72 hours. Lipid Profile No results for input(s): CHOL, HDL, LDLCALC, TRIG, CHOLHDL, LDLDIRECT in the last 72 hours. Thyroid function studies No results for input(s): TSH, T4TOTAL, T3FREE, THYROIDAB in the last 72 hours.  Invalid input(s): FREET3 Anemia work up No results for input(s): VITAMINB12, FOLATE, FERRITIN, TIBC, IRON, RETICCTPCT in the last 72 hours. Urinalysis    Component Value Date/Time   COLORURINE YELLOW 09/13/2016 1116   APPEARANCEUR CLEAR 09/13/2016 1116   LABSPEC 1.025 09/13/2016 1116   PHURINE 6.0 09/13/2016 1116   GLUCOSEU NEGATIVE 09/13/2016 1116   [place] 09/13/2016 1116   [place] 09/13/2016 1116   [place] 09/13/2016 1116   PROTEINUR TRACE (A) 09/13/2016 1116   NITRITE NEGATIVE 09/13/2016 1116   LEUKOCYTESUR NEGATIVE 09/13/2016 1116   Sepsis Labs Invalid input(s): PROCALCITONIN,  WBC,  LACTICIDVEN Microbiology Recent Results (from the past 240 hour(s))  Culture, Urine     Status: None   Collection Time: 09/13/16 11:16 AM  Result Value Ref Range Status   Specimen Description URINE, CLEAN CATCH  Final   Special Requests NONE  Final   Culture   Final    NO GROWTH Performed at [facility], [place] [address] [place], [place] [address]    Report Status 09/14/2016 FINAL  Final     Time coordinating discharge: Over 30 minutes  SIGNED:   Rosita Fire, MD  [facility] 09/18/2016, 12:14 PM  If 7PM-7AM, please contact night-coverage [email or web address] Password [email or web address]

## 2016-09-18 NOTE — Progress Notes (Signed)
[name] [name] discharged Home per MD order.  Discharge instructions reviewed and discussed with the patient, all questions and concerns answered. Copy of instructions and scripts given to patient.  Allergies as of 09/18/2016      Reactions   Dyflex-g [dyphylline-guaifenesin] Hives      Medication List    STOP taking these medications   lisinopril 5 MG tablet Commonly known as:  PRINIVIL,ZESTRIL     TAKE these medications   aspirin 325 MG tablet Take 325 mg by mouth daily.   carvedilol 6.25 MG tablet Commonly known as:  COREG Take 1 tablet (6.25 mg total) by mouth 2 (two) times daily with a meal.   CENTRUM SILVER PO Take 1 tablet by mouth daily.   clopidogrel 75 MG tablet Commonly known as:  PLAVIX Take 1 tablet (75 mg total) by mouth daily.   furosemide 40 MG tablet Commonly known as:  LASIX Take 2 tablets (80 mg total) by mouth 2 (two) times daily. What changed:  how much to take  when to take this  reasons to take this   HYDROcodone-acetaminophen 10-325 MG tablet Commonly known as:  NORCO Take 1 tablet by mouth every 8 (eight) hours as needed. What changed:  reasons to take this   nitroGLYCERIN 0.4 MG SL tablet Commonly known as:  NITROSTAT PLACE 1 TABLET UNDER TONGUE FOR CHEST PAIN. MAY REPEAT EVERY 5 MIN UPTO 3 DOSES-NO RELIEF,CALL 911.   polycarbophil 625 MG tablet Commonly known as:  FIBERCON Take 2 tablets (1,250 mg total) by mouth daily with lunch. Hold if you have diarrhea   potassium chloride SA 20 MEQ tablet Commonly known as:  K-DUR,KLOR-CON Take 1 tablet (20 mEq total) by mouth daily. Start taking on:  09/19/2016   rosuvastatin 40 MG tablet Commonly known as:  CRESTOR Take 1 tablet (40 mg total) by mouth daily.   trolamine salicylate 10 % cream Commonly known as:  ASPERCREME Apply 1 application topically as needed for muscle pain.       Patients skin is clean, dry and intact, no evidence of skin break down. IV site discontinued and  catheter remains intact. Site without signs and symptoms of complications. Dressing and pressure applied.  Patient escorted to car in a wheelchair,  no distress noted upon discharge.  [name] [name] [name] [name] 09/18/2016 5:47 PM

## 2016-09-18 NOTE — Telephone Encounter (Signed)
Please arrange outpatient hospital follow-up with Randall Hiss, myself, or Dr. Oneida Alar. History of constipation, discuss screening colonoscopy.

## 2016-09-20 ENCOUNTER — Telehealth: Payer: Self-pay | Admitting: Family Medicine

## 2016-09-20 NOTE — Telephone Encounter (Signed)
Has appt 1/29 with dr Nicki Reaper for hospital follow up.

## 2016-09-20 NOTE — Telephone Encounter (Signed)
I would recommend a standard follow-up office visit within the next 2 weeks that is typical for someone who is been in the hospital with a complex issue. It would not surprise me if the patient declines on doing a follow-up and chooses to follow-up with Dr. branch as previously scheduled-certainly he can follow-up with both of [place]. As for the sleep study if he refuses it it is very difficult to make someone do what they don't want to do

## 2016-09-20 NOTE — Telephone Encounter (Signed)
Message received from [facility]  They are unable to use the 2015 sleep study for him to qualify for a CPAP with his Medicare  They told pt he would need a new OV here and diagnostic sleep study  Pt states to [facility] he's "not interested"  Please advise    [place]  DOB Aug 06, 1951   Medicare will NOT accept a SS from 2015. Patient was called to inform he would need a new OV and a new diagnositic ss.   He informed Dimas Chyle with [facility] he was not interested.   Please inform Dr. Sallee Lange.   Thank you Brendale.

## 2016-09-25 ENCOUNTER — Ambulatory Visit (INDEPENDENT_AMBULATORY_CARE_PROVIDER_SITE_OTHER): Payer: Medicare Other | Admitting: Family Medicine

## 2016-09-25 ENCOUNTER — Encounter: Payer: Self-pay | Admitting: Family Medicine

## 2016-09-25 VITALS — BP 150/96 | Wt 325.0 lb

## 2016-09-25 DIAGNOSIS — R600 Localized edema: Secondary | ICD-10-CM | POA: Diagnosis not present

## 2016-09-25 DIAGNOSIS — N289 Disorder of kidney and ureter, unspecified: Secondary | ICD-10-CM

## 2016-09-25 DIAGNOSIS — R0609 Other forms of dyspnea: Secondary | ICD-10-CM

## 2016-09-25 MED ORDER — HYDROCODONE-ACETAMINOPHEN 10-325 MG PO TABS: 1.0000 | ORAL_TABLET | Freq: Three times a day (TID) | ORAL | 0 refills | Status: DC | PRN

## 2016-09-25 MED ORDER — METOLAZONE 2.5 MG PO TABS: ORAL_TABLET | ORAL | 3 refills | Status: DC

## 2016-09-25 NOTE — Progress Notes (Signed)
   Subjective:    Patient ID: [name] [name], male    DOB: April 24, 1951, 66 y.o.   MRN: [id number]  HPI Patient in office today for post hospitalization follow up.  Patient states he was better yesterday, but injured chest yesterday.  He c/o SOB with pain and swelling.  This gentleman had significant severe end-stage CHF as well as anasarca he is gone home but he has retained a fair amount of fluid in it seems to be going up significantly. He is having shortness of breath on Sunday not as much today He does try to watch salt intake to some degree ER notes hospitalization notes labs echo cardiology consult all reviewed with the patient in detail Wife was present We discussed the serious nature of his 15% ejection fraction. We also discussed why it was important for the patient to follow-up within 7 days of hospitalization to try to help him and help minimize her risk of having to be back in the hospital Review of Systems Currently denies chest pressure tightness sweats chills does relate swelling in the legs and on the abdomen denies headaches nausea vomiting diarrhea denies joint pain    Objective:   Physical Exam Neck no abnormal JVD lungs are clear no crackles heart regular but ejection fraction per echo was 15% abdomen soft morbidly obese patient with significant fluid edema  Pitting edema lower abdomen also in the legs as well     Assessment & Plan:  Severe end-stage CHF-continue all medications at cardiologist has him on but I recommend adding Zaroxolyn 2.5 mg one every morning for the next 4 days then every other day the wife is supposed to send [place] his weights over the course of next 5-6 days Patient will do a metabolic 7 because her renal insufficiency Patient is at severe risk of early death as well as rehospitalization Diet Mild activity recommended Low-sodium approach recommended

## 2016-09-25 NOTE — Patient Instructions (Addendum)
Add Zaroxlyn 2.5 mg one each am for next 4 days then once every other day Continue all other meds as expected       Stay less than 2,000 mg salt per day   Low-Sodium Eating Plan Sodium raises blood pressure and causes water to be held in the body. Getting less sodium from food will help lower your blood pressure, reduce any swelling, and protect your heart, liver, and kidneys. We get sodium by adding salt (sodium chloride) to food. Most of our sodium comes from canned, boxed, and frozen foods. Restaurant foods, fast foods, and pizza are also very high in sodium. Even if you take medicine to lower your blood pressure or to reduce fluid in your body, getting less sodium from your food is important. What is my plan? Most people should limit their sodium intake to 2,300 mg a day. Your health care provider recommends that you limit your sodium intake to __________ a day. What do I need to know about this eating plan? For the low-sodium eating plan, you will follow these general guidelines:  Choose foods with a % Daily Value for sodium of less than 5% (as listed on the food label).  Use salt-free seasonings or herbs instead of table salt or sea salt.  Check with your health care provider or pharmacist before using salt substitutes.  Eat fresh foods.  Eat more vegetables and fruits.  Limit canned vegetables. If you do use them, rinse them well to decrease the sodium.  Limit cheese to 1 oz (28 g) per day.  Eat lower-sodium products, often labeled as "lower sodium" or "no salt added."  Avoid foods that contain monosodium glutamate (MSG). MSG is sometimes added to [place] food and some canned foods.  Check food labels (Nutrition Facts labels) on foods to learn how much sodium is in one serving.  Eat more home-cooked food and less restaurant, buffet, and fast food.  When eating at a restaurant, ask that your food be prepared with less salt, or no salt if possible. How do I read food  labels for sodium information? The Nutrition Facts label lists the amount of sodium in one serving of the food. If you eat more than one serving, you must multiply the listed amount of sodium by the number of servings. Food labels may also identify foods as:  Sodium free-Less than 5 mg in a serving.  Very low sodium-35 mg or less in a serving.  Low sodium-140 mg or less in a serving.  Light in sodium-50% less sodium in a serving. For example, if a food that usually has 300 mg of sodium is changed to become light in sodium, it will have 150 mg of sodium.  Reduced sodium-25% less sodium in a serving. For example, if a food that usually has 400 mg of sodium is changed to reduced sodium, it will have 300 mg of sodium. What foods can I eat? Grains  Low-sodium cereals, including oats, puffed wheat and rice, and shredded wheat cereals. Low-sodium crackers. Unsalted rice and pasta. Lower-sodium bread. Vegetables  Frozen or fresh vegetables. Low-sodium or reduced-sodium canned vegetables. Low-sodium or reduced-sodium tomato sauce and paste. Low-sodium or reduced-sodium tomato and vegetable juices. Fruits  Fresh, frozen, and canned fruit. Fruit juice. Meat and Other Protein Products  Low-sodium canned tuna and salmon. Fresh or frozen meat, poultry, seafood, and fish. Lamb. Unsalted nuts. Dried beans, peas, and lentils without added salt. Unsalted canned beans. Homemade soups without salt. Eggs. Dairy  Milk. Soy  milk. Ricotta cheese. Low-sodium or reduced-sodium cheeses. Yogurt. Condiments  Fresh and dried herbs and spices. Salt-free seasonings. Onion and garlic powders. Low-sodium varieties of mustard and ketchup. Fresh or refrigerated horseradish. Lemon juice. Fats and Oils  Reduced-sodium salad dressings. Unsalted butter. Other  Unsalted popcorn and pretzels. The items listed above may not be a complete list of recommended foods or beverages. Contact your dietitian for more options.  What  foods are not recommended? Grains  Instant hot cereals. Bread stuffing, pancake, and biscuit mixes. Croutons. Seasoned rice or pasta mixes. Noodle soup cups. Boxed or frozen macaroni and cheese. Self-rising flour. Regular salted crackers. Vegetables  Regular canned vegetables. Regular canned tomato sauce and paste. Regular tomato and vegetable juices. Frozen vegetables in sauces. Salted [place] fries. Olives. Angie Fava. Relishes. Sauerkraut. Salsa. Meat and Other Protein Products  Salted, canned, smoked, spiced, or pickled meats, seafood, or fish. Bacon, ham, sausage, hot dogs, corned beef, chipped beef, and packaged luncheon meats. Salt pork. Jerky. Pickled herring. Anchovies, regular canned tuna, and sardines. Salted nuts. Dairy  Processed cheese and cheese spreads. Cheese curds. Blue cheese and cottage cheese. Buttermilk. Condiments  Onion and garlic salt, seasoned salt, table salt, and sea salt. Canned and packaged gravies. Worcestershire sauce. Tartar sauce. Barbecue sauce. Teriyaki sauce. Soy sauce, including reduced sodium. Steak sauce. Fish sauce. Oyster sauce. Cocktail sauce. Horseradish that you find on the shelf. Regular ketchup and mustard. Meat flavorings and tenderizers. Bouillon cubes. Hot sauce. Tabasco sauce. Marinades. Taco seasonings. Relishes. Fats and Oils  Regular salad dressings. Salted butter. Margarine. Ghee. Bacon fat. Other  Potato and tortilla chips. Corn chips and puffs. Salted popcorn and pretzels. Canned or dried soups. Pizza. Frozen entrees and pot pies. The items listed above may not be a complete list of foods and beverages to avoid. Contact your dietitian for more information.  This information is not intended to replace advice given to you by your health care provider. Make sure you discuss any questions you have with your health care provider. Document Released: 02/03/2002 Document Revised: 01/20/2016 Document Reviewed: 06/18/2013 Elsevier Interactive Patient  Education  2017 [employer].

## 2016-09-26 LAB — BASIC METABOLIC PANEL
BUN / CREAT RATIO: 19 (ref 10–24)
BUN: 22 mg/dL (ref 8–27)
CALCIUM: 8.5 mg/dL — AB (ref 8.6–10.2)
CHLORIDE: 93 mmol/L — AB (ref 96–106)
CO2: 29 mmol/L (ref 18–29)
Creatinine, Ser: 1.13 mg/dL (ref 0.76–1.27)
GFR calc Af Amer: 78 mL/min/{1.73_m2} (ref >59)
GFR calc non Af Amer: 67 mL/min/{1.73_m2} (ref >59)
GLUCOSE: 170 mg/dL — AB (ref 65–99)
Potassium: 3.6 mmol/L (ref 3.5–5.2)
Sodium: 141 mmol/L (ref 134–144)

## 2016-09-27 ENCOUNTER — Other Ambulatory Visit: Payer: Self-pay | Admitting: Family Medicine

## 2016-09-27 ENCOUNTER — Other Ambulatory Visit: Payer: Self-pay

## 2016-09-27 DIAGNOSIS — Z79899 Other long term (current) drug therapy: Secondary | ICD-10-CM

## 2016-09-27 DIAGNOSIS — E876 Hypokalemia: Secondary | ICD-10-CM

## 2016-10-10 ENCOUNTER — Ambulatory Visit: Payer: Medicare Other | Admitting: Gastroenterology

## 2016-10-12 ENCOUNTER — Encounter: Payer: Medicare Other | Admitting: Cardiology

## 2016-10-13 ENCOUNTER — Telehealth: Payer: Self-pay | Admitting: Family Medicine

## 2016-10-13 ENCOUNTER — Encounter: Payer: Self-pay | Admitting: Family Medicine

## 2016-10-13 ENCOUNTER — Ambulatory Visit (INDEPENDENT_AMBULATORY_CARE_PROVIDER_SITE_OTHER): Payer: Medicare Other | Admitting: Family Medicine

## 2016-10-13 VITALS — BP 110/60 | HR 74 | Ht 70.5 in | Wt 279.0 lb

## 2016-10-13 DIAGNOSIS — J3489 Other specified disorders of nose and nasal sinuses: Secondary | ICD-10-CM | POA: Diagnosis not present

## 2016-10-13 DIAGNOSIS — N289 Disorder of kidney and ureter, unspecified: Secondary | ICD-10-CM | POA: Diagnosis not present

## 2016-10-13 DIAGNOSIS — I5043 Acute on chronic combined systolic (congestive) and diastolic (congestive) heart failure: Secondary | ICD-10-CM | POA: Diagnosis not present

## 2016-10-13 DIAGNOSIS — Z79899 Other long term (current) drug therapy: Secondary | ICD-10-CM | POA: Diagnosis not present

## 2016-10-13 MED ORDER — HYDROCODONE-ACETAMINOPHEN 10-325 MG PO TABS: 1.0000 | ORAL_TABLET | Freq: Three times a day (TID) | ORAL | 0 refills | Status: DC | PRN

## 2016-10-13 MED ORDER — NITROGLYCERIN 0.4 MG SL SUBL: SUBLINGUAL_TABLET | SUBLINGUAL | 6 refills | Status: DC

## 2016-10-13 NOTE — Telephone Encounter (Signed)
Patient wants to know if he could take the supplement drinks, such as Ensure, with the medications that he is currently on.

## 2016-10-13 NOTE — Telephone Encounter (Signed)
Yes he can but I would not recommend using this on a frequent basis because of the significant amount of calories present in the supplements

## 2016-10-13 NOTE — Telephone Encounter (Signed)
Patient advised Yes he can but Dr Nicki Reaper would not recommend using this on a frequent basis because of the significant amount of calories present in the supplements. Patient verbalized understanding.

## 2016-10-13 NOTE — Progress Notes (Signed)
   Subjective:    Patient ID: [name] [name], male    DOB: 09-Jan-1951, 66 y.o.   MRN: [id number]  HPI Patient is here today for a follow up visit on pedal edema. Patient states his breathing has improved.  Patient has no other concerns at this time.  His weight has gradually come down from 325 pounds down to 277 he was using Zaroxolyn every day for the first 4 days then he was using it every other day along with his Lasix he states he feels weak today but he states before this his energy is been strong his breathing is doing much better Denies any abdominal symptoms and no vomiting or diarrhea fever chills He has follow-up with cardiology coming up Review of Systems Please see above no PND no orthopnea    Objective:   Physical Exam His lungs are clear clear there are no crackles heart rate is controlled pulses normal extremities no edema he is significantly less swollen than what he was  Patient also complains of nasal obstruction on the left side we will refer to ENT for further evaluation We will recheck this patient in 2 months he will see cardiology in a couple weeks    Assessment & Plan:  Severe CHF Anasarca much better with diuretics Recheck metabolic 7 Patient to stop some rocks and 1 until we have the results of his lab work.

## 2016-10-14 LAB — BASIC METABOLIC PANEL
BUN/Creatinine Ratio: 28 — ABNORMAL HIGH (ref 10–24)
BUN: 28 mg/dL — ABNORMAL HIGH (ref 8–27)
CO2: 39 mmol/L — ABNORMAL HIGH (ref 18–29)
CREATININE: 1 mg/dL (ref 0.76–1.27)
Calcium: 10.3 mg/dL — ABNORMAL HIGH (ref 8.6–10.2)
Chloride: 80 mmol/L — ABNORMAL LOW (ref 96–106)
GFR calc Af Amer: 90 mL/min/{1.73_m2} (ref >59)
GFR, EST NON AFRICAN AMERICAN: 78 mL/min/{1.73_m2} (ref >59)
Glucose: 278 mg/dL — ABNORMAL HIGH (ref 65–99)
POTASSIUM: 2.7 mmol/L — AB (ref 3.5–5.2)
SODIUM: 139 mmol/L (ref 134–144)

## 2016-10-16 ENCOUNTER — Encounter: Payer: Self-pay | Admitting: Family Medicine

## 2016-10-17 ENCOUNTER — Other Ambulatory Visit: Payer: Self-pay

## 2016-10-17 MED ORDER — POTASSIUM CHLORIDE CRYS ER 20 MEQ PO TBCR: 20.0000 meq | EXTENDED_RELEASE_TABLET | Freq: Two times a day (BID) | ORAL | 0 refills | Status: DC

## 2016-10-17 NOTE — Addendum Note (Signed)
Addended by: Launa Grill on: 10/17/2016 02:45 PM   Modules accepted: Orders

## 2016-10-19 ENCOUNTER — Telehealth: Payer: Self-pay | Admitting: Family Medicine

## 2016-10-19 NOTE — Telephone Encounter (Signed)
Patient called wanting to talk to [facility] again. Wouldn't elaborate other than to say "just tell her [name] [name] wants to talk to her again"!

## 2016-10-19 NOTE — Telephone Encounter (Signed)
Spoke with patient to discuss concerns regarding referral letter.

## 2016-10-23 ENCOUNTER — Telehealth: Payer: Self-pay | Admitting: Family Medicine

## 2016-10-23 DIAGNOSIS — Z79899 Other long term (current) drug therapy: Secondary | ICD-10-CM

## 2016-10-23 NOTE — Telephone Encounter (Signed)
I would recommend reinitiating Zaroxolyn, keep other medications you Zaroxolyn on Monday Wednesday and Fridays. Monitor weight ov couple weeks  check metabolic 7 in appately 40-76 days, keep [place] updated on weights-he should write these down on a sheet of paper on a daily basisin provide [place] a copy in approximately 2 weeks

## 2016-10-23 NOTE — Telephone Encounter (Signed)
Med is Zaroxolyn.

## 2016-10-23 NOTE — Telephone Encounter (Signed)
Notified patient recommend reinitiating Zaroxolyn, keep other medications the same, Zaroxolyn on Monday Wednesday and Fridays. Monitor weight ov couple weeks Check metabolic 7 in 12-25 days, keep [place] updated on weights-he should write these down on a sheet of paper on a daily basis provide [place] a copy in approximately 2 weeks. Patient verbalized understanding. Lab order in system.

## 2016-10-23 NOTE — Telephone Encounter (Signed)
Patient called to say that since he stopped the medicine, "z" he has gained 8 pounds. Needs advice.  [phone]

## 2016-10-24 ENCOUNTER — Ambulatory Visit: Payer: Medicare Other | Admitting: Gastroenterology

## 2016-10-24 ENCOUNTER — Ambulatory Visit (INDEPENDENT_AMBULATORY_CARE_PROVIDER_SITE_OTHER): Payer: Medicare Other | Admitting: Cardiology

## 2016-10-24 ENCOUNTER — Encounter: Payer: Self-pay | Admitting: Cardiology

## 2016-10-24 VITALS — BP 118/76 | HR 82 | Ht 70.0 in | Wt 293.0 lb

## 2016-10-24 DIAGNOSIS — I2510 Atherosclerotic heart disease of native coronary artery without angina pectoris: Secondary | ICD-10-CM

## 2016-10-24 DIAGNOSIS — I10 Essential (primary) hypertension: Secondary | ICD-10-CM | POA: Diagnosis not present

## 2016-10-24 DIAGNOSIS — Z79899 Other long term (current) drug therapy: Secondary | ICD-10-CM | POA: Diagnosis not present

## 2016-10-24 DIAGNOSIS — I5023 Acute on chronic systolic (congestive) heart failure: Secondary | ICD-10-CM

## 2016-10-24 DIAGNOSIS — E782 Mixed hyperlipidemia: Secondary | ICD-10-CM | POA: Diagnosis not present

## 2016-10-24 MED ORDER — METOLAZONE 2.5 MG PO TABS: 2.5000 mg | ORAL_TABLET | Freq: Every day | ORAL | 3 refills | Status: DC

## 2016-10-24 MED ORDER — CLOPIDOGREL BISULFATE 75 MG PO TABS: 75.0000 mg | ORAL_TABLET | Freq: Every day | ORAL | 3 refills | Status: DC

## 2016-10-24 MED ORDER — TORSEMIDE 20 MG PO TABS: 40.0000 mg | ORAL_TABLET | Freq: Two times a day (BID) | ORAL | 3 refills | Status: DC

## 2016-10-24 NOTE — Progress Notes (Signed)
Clinical Summary [name] [name] is a 66 y.o.male seen today for follow up of the following medical problems.   1. Chronic systolic HF - history of ICM, LVEF 10-15% by echo Jan 2018. Restricitive diastolic dysfunction.  - ICD followed by EP - admit Jan 2018 with acute on chronic CHF. Diuresed nearly 15 lbs. Discharge weight 297 lbs. From pcp note 10/13/16 down to 279 lbs.  - recent weight gain at home after stopping metolazone daily metolazone. Now up to 293 lbs by our scales, 287 by his home scale. Reports abdominal distension, some SOB.    2. CAD History of CABG in 2001(LIMA to distal LAD, SVG to RI, SVG to OM 2, sequential SVG to our PLA, SVG to acute marginal, SVG to posterior descending artery - no recent chest pain  3. HTN - compliant with meds  4. Hyperlipidemia - compliant with statin  5. OSA - compliant with CPAP  6. Aortic stenosis - moderate by echo Jan 2018.    Past Medical History:  Diagnosis Date  . AICD (automatic cardioverter/defibrillator) present 05/18/2016  . CAD (coronary artery disease)   . Cardiomyopathy, ischemic   . CHF (congestive heart failure) ([place])   . Chronic back pain   . Diabetes mellitus without complication (HCC)    prediabetes  . Elevated PSA   . Hyperlipidemia   . Morbid obesity ([place])   . OSA (obstructive sleep apnea)    does not wear CPAP, thinks one has been ordered     Allergies  Allergen Reactions  . Dyflex-G [Dyphylline-Guaifenesin] Hives     Current Outpatient Prescriptions  Medication Sig Dispense Refill  . aspirin 325 MG tablet Take 325 mg by mouth daily.    . carvedilol (COREG) 6.25 MG tablet Take 1 tablet (6.25 mg total) by mouth 2 (two) times daily with a meal. 60 tablet 0  . clopidogrel (PLAVIX) 75 MG tablet Take 1 tablet (75 mg total) by mouth daily. 90 tablet 1  . furosemide (LASIX) 40 MG tablet Take 2 tablets (80 mg total) by mouth 2 (two) times daily. 60 tablet 0  . HYDROcodone-acetaminophen (NORCO) 10-325  MG tablet Take 1 tablet by mouth every 8 (eight) hours as needed. 30 tablet 0  . metolazone (ZAROXOLYN) 2.5 MG tablet One q am as directed 30 tablet 3  . Multiple Vitamins-Minerals (CENTRUM SILVER PO) Take 1 tablet by mouth daily.     . nitroGLYCERIN (NITROSTAT) 0.4 MG SL tablet PLACE 1 TABLET UNDER TONGUE FOR CHEST PAIN. MAY REPEAT EVERY 5 MIN UPTO 3 DOSES-NO RELIEF,CALL 911. 25 tablet 6  . polycarbophil (FIBERCON) 625 MG tablet Take 2 tablets (1,250 mg total) by mouth daily with lunch. Hold if you have diarrhea 30 tablet 0  . potassium chloride SA (K-DUR,KLOR-CON) 20 MEQ tablet Take 1 tablet (20 mEq total) by mouth 2 (two) times daily. 60 tablet 0  . rosuvastatin (CRESTOR) 40 MG tablet Take 1 tablet (40 mg total) by mouth daily. 90 tablet 2  . trolamine salicylate (ASPERCREME) 10 % cream Apply 1 application topically as needed for muscle pain.     No current facility-administered medications for this visit.      Past Surgical History:  Procedure Laterality Date  . APPENDECTOMY    . CAROTID STENT    . CATARACT EXTRACTION, BILATERAL    . CORONARY ARTERY BYPASS GRAFT    . EP IMPLANTABLE DEVICE N/A 05/18/2016   Procedure: ICD Implant;  Surgeon: Will Meredith Leeds, MD;  Location: [facility]  INVASIVE CV LAB;  Service: Cardiovascular;  Laterality: N/A;  . ingrown toenail N/A   . LEFT HEART CATHETERIZATION WITH CORONARY/GRAFT ANGIOGRAM N/A 12/21/2014   Procedure: LEFT HEART CATHETERIZATION WITH Beatrix Fetters;  Surgeon: Belva Crome, MD;  Location: [facility] CATH LAB;  Service: Cardiovascular;  Laterality: N/A;     Allergies  Allergen Reactions  . Dyflex-G [Dyphylline-Guaifenesin] Hives      Family History  Problem Relation Age of Onset  . Adopted: Yes  . Family history unknown: Yes     Social History Mr. Sabino reports that he has quit smoking. He has never used smokeless tobacco. Mr. Damian reports that he does not drink alcohol.   Review of Systems CONSTITUTIONAL: No weight  loss, fever, chills, weakness or fatigue.  HEENT: Eyes: No visual loss, blurred vision, double vision or yellow sclerae.No hearing loss, sneezing, congestion, runny nose or sore throat.  SKIN: No rash or itching.  CARDIOVASCULAR: per HPI RESPIRATORY: +SOB GASTROINTESTINAL: No anorexia, nausea, vomiting or diarrhea. No abdominal pain or blood.  GENITOURINARY: No burning on urination, no polyuria NEUROLOGICAL: No headache, dizziness, syncope, paralysis, ataxia, numbness or tingling in the extremities. No change in bowel or bladder control.  MUSCULOSKELETAL: No muscle, back pain, joint pain or stiffness.  LYMPHATICS: No enlarged nodes. No history of splenectomy.  PSYCHIATRIC: No history of depression or anxiety.  ENDOCRINOLOGIC: No reports of sweating, cold or heat intolerance. No polyuria or polydipsia.  Marland Kitchen   Physical Examination Vitals:   10/24/16 1004  BP: 118/76  Pulse: 82   Vitals:   10/24/16 1004  Weight: 293 lb (132.9 kg)  Height: 5\' 10"  (1.778 m)    Gen: resting comfortably, no acute distress HEENT: no scleral icterus, pupils equal round and reactive, no palptable cervical adenopathy,  CV: RRR, no m/r/g, no jvd Resp: Clear to auscultation bilaterally GI: abdomen is soft, non-tender, non-distended, normal bowel sounds, no hepatosplenomegaly MSK: extremities are warm, no edema.  Skin: warm, no rash Neuro:  no focal deficits Psych: appropriate affect   Diagnostic Studies  Jan 2018 echo Study Conclusions  - Left ventricle: The cavity size was normal. Wall thickness was   normal. The estimated ejection fraction was in the range of 10%   to 15%. Diffuse hypokinesis. Doppler parameters are consistent   with restrictive physiology, indicative of decreased left   ventricular diastolic compliance and/or increased left atrial   pressure. Doppler parameters are consistent with high ventricular   filling pressure. - Aortic valve: Moderately calcified annulus. Moderately  thickened   leaflets. There was moderate stenosis. AV gradients decreased in   the setting of severe LV systolic dysfunction. AVA VTI and   dimensionless index support moderate aortic stenosis. Mean   gradient (S): 17 mm Hg. Valve area (VTI): 1.2 cm^2. Valve area   (Vmax): 1.1 cm^2. - Left atrium: The atrium was severely dilated. - Pulmonary arteries: Systolic pressure was moderately increased.   PA peak pressure: 58 mm Hg (S). - Inferior vena cava: The vessel was dilated. The respirophasic   diameter changes were blunted (< 50%), consistent with elevated   central venous pressure. - Technically difficult study. Echocontrast was used to enhance   visualization.   Assessment and Plan  1. Acute on chronic systolic HF - weight recently down to 279 lbs, now back up to 293 lbs with increased abomdinal distension and SOB - will change lasix to torsemide 40mg  bid and change metolazone to 2.5mg  qFriday. Would try to limit metolazone doses if possible -  repeat BMET and Mg in 1 week - he is to call Friday and update [place] on his weights.  - once euvolemic consider further medication titration  2. CAD - no current symptoms - continue current meds  3. HTN - at goal, continue current meds  4. Hyperlipidemia - continue statin  5. OSA - continue CPAP  6. Aortic stenosis - moderate by most recent echo. Continue to montor  F/u 2-3 weeks with PA to reassess diuresis and diuretics.   Arnoldo Lenis, M.D

## 2016-10-24 NOTE — Patient Instructions (Signed)
Medication Instructions:  STOP FUROSEMIDE START TORSEMIDE 40 MG TWO TIMES DAILY  START METOLAZONE 2.5 MG - TAKE 1 TABLET EVERY FRIDAY  Labwork: Your physician recommends that you return for lab work in: [place]    Testing/Procedures: NONE  Follow-Up: Your physician recommends that you schedule a follow-up appointment in:  2-3 WEEKS   Any Other Special Instructions Will Be Listed Below (If Applicable). PLEASE CALL us Friday WITH YOUR WEIGHT    If you need a refill on your cardiac medications before your next appointment, please call your pharmacy.

## 2016-11-06 ENCOUNTER — Ambulatory Visit (INDEPENDENT_AMBULATORY_CARE_PROVIDER_SITE_OTHER): Payer: Medicare Other | Admitting: Otolaryngology

## 2016-11-07 DIAGNOSIS — Z79899 Other long term (current) drug therapy: Secondary | ICD-10-CM | POA: Diagnosis not present

## 2016-11-08 ENCOUNTER — Telehealth: Payer: Self-pay | Admitting: Family Medicine

## 2016-11-08 ENCOUNTER — Ambulatory Visit (INDEPENDENT_AMBULATORY_CARE_PROVIDER_SITE_OTHER): Payer: Medicare Other | Admitting: Physician Assistant

## 2016-11-08 ENCOUNTER — Telehealth: Payer: Self-pay | Admitting: *Deleted

## 2016-11-08 ENCOUNTER — Encounter: Payer: Self-pay | Admitting: Physician Assistant

## 2016-11-08 VITALS — BP 122/62 | HR 46 | Ht 70.5 in | Wt 278.0 lb

## 2016-11-08 DIAGNOSIS — E876 Hypokalemia: Secondary | ICD-10-CM

## 2016-11-08 DIAGNOSIS — E119 Type 2 diabetes mellitus without complications: Secondary | ICD-10-CM | POA: Diagnosis not present

## 2016-11-08 DIAGNOSIS — I2510 Atherosclerotic heart disease of native coronary artery without angina pectoris: Secondary | ICD-10-CM | POA: Diagnosis not present

## 2016-11-08 DIAGNOSIS — Z4502 Encounter for adjustment and management of automatic implantable cardiac defibrillator: Secondary | ICD-10-CM | POA: Diagnosis not present

## 2016-11-08 DIAGNOSIS — I10 Essential (primary) hypertension: Secondary | ICD-10-CM | POA: Diagnosis not present

## 2016-11-08 DIAGNOSIS — N183 Chronic kidney disease, stage 3 unspecified: Secondary | ICD-10-CM

## 2016-11-08 DIAGNOSIS — I5042 Chronic combined systolic (congestive) and diastolic (congestive) heart failure: Secondary | ICD-10-CM

## 2016-11-08 LAB — BASIC METABOLIC PANEL
BUN: 27 mg/dL — ABNORMAL HIGH (ref 7–25)
CO2: >45 mmol/L — ABNORMAL HIGH (ref 20–31)
Calcium: 9.4 mg/dL (ref 8.6–10.3)
Chloride: <80 mmol/L — ABNORMAL LOW (ref 98–110)
Creat: 1.26 mg/dL — ABNORMAL HIGH (ref 0.70–1.25)
GLUCOSE: 430 mg/dL — AB (ref 65–99)
POTASSIUM: 2.7 mmol/L — AB (ref 3.5–5.3)
SODIUM: 135 mmol/L (ref 135–146)

## 2016-11-08 LAB — MAGNESIUM: MAGNESIUM: 2.4 mg/dL (ref 1.5–2.5)

## 2016-11-08 MED ORDER — POTASSIUM CHLORIDE CRYS ER 20 MEQ PO TBCR: 40.0000 meq | EXTENDED_RELEASE_TABLET | Freq: Two times a day (BID) | ORAL | 3 refills | Status: DC

## 2016-11-08 NOTE — Telephone Encounter (Signed)
[name] [name], Thank you for your note. We are replacing his potassiu.

## 2016-11-08 NOTE — Telephone Encounter (Signed)
Cardiology will address his low potassium at his office visit thank you

## 2016-11-08 NOTE — Telephone Encounter (Signed)
Patient has appt with PA Lenze today, labs to be addressed at that time   J Alvino Lechuga MD

## 2016-11-08 NOTE — Telephone Encounter (Signed)
Patient wanted to let Dr. Nicki Reaper and Hoyle Sauer know that his heart machine went off and he received a call this morning letting him know to come in today to see the cardiologist.  He wanted to let everyone know how much he appreciates everyone and what they do for him.

## 2016-11-08 NOTE — Telephone Encounter (Signed)
[employer] Care[employer] alert this morning for ICD shock 11/07/16 at 11:24pm. Called [name] [name] to make him aware and inquire about symptoms. [name] [name] does not recall receiving a shock last night but does say that he was dizzy and his legs were wobbly yesterday and that he typically sits down when he feels this way. He denies/ cannot recall syncope or fall. VF noted at 231bpm successfully terminated with a 36.4J shock.  Labwork reviewed from 11/07/16- K 2.7. [name] [name] confirms that he is taking his metolazone/ torsemide/ kdur as prescribed. I asked him to bring all of his medications to today's appt with Nena Polio. He confirms that he will.  I made him aware of [place] driving restrictions x 6 months. He verbalizes understanding.

## 2016-11-08 NOTE — Telephone Encounter (Signed)
Received call from [facility] with [name] [name] - calling to report critical lab.  Potassium:  2.7

## 2016-11-08 NOTE — Patient Instructions (Addendum)
Your physician recommends that you schedule a follow-up appointment in: 2 Weeks Dr. Harl Bowie   Your physician has recommended you make the following change in your medication:  Take 2  Potassium Now. Take 2 Potassium in 2 Hours (3:30 pm) Take 2 Potassium in 4 Hours ( 5:30 PM)    Potassium 40 mEq Two Times Daily Starting Tomorrow 11/09/16   Stop Taking Metolazone   Call our office if you have a weight gain of 2 pounds or more over night or 5 pounds in one week.   Your physician recommends that you return for lab work in: Friday  If you need a refill on your cardiac medications before your next appointment, please call your pharmacy.  Thank you for choosing [facility]!

## 2016-11-08 NOTE — Progress Notes (Signed)
Cardiology Office Note    Date:  11/08/2016   ID:  [name] [name], DOB 11-21-1950, MRN [id number]  PCP:  [name] Lange, MD  Cardiologist: Dr. Harl Bowie   Chief Complaint  Patient presents with  . Follow-up    History of Present Illness:  [name] [name] is a 66 y.o. male with history of chronic systolic heart failure, ischemic cardiomyopathy LVEF 10-15% by echo 08/2016 with restrictive diastolic dysfunction. Patient has ICD followed by EPS. He had an admission January 2018 with acute on chronic CHF and diuresed nearly 15 pounds. Discharge weight was 297 pounds. Weight was 279 pounds 10/13/16. Daily metolazone was stopped and weight was up to 293 pounds 10/24/16 when he saw Dr. Harl Bowie. Patient also has CAD status post CABG in 2001, hypertension, HLD, moderate aortic stenosis, OSA on CPAP.  Dr. Harl Bowie changed his Lasix to torsemide 40 mg twice a day and change metolazone to 2.5 mg every Friday. He had labs drawn yesterday and potassium came back 2.7, BS 430. Patient also received a call last night because his ICD shocked him at 11:24 PM. He says was dizzy and his legs were wobbly yesterday but he did not feel the shock. He brings in a record of his weights from home. When he started losing weight he decided to celebrate and go out to eat at [employer] and added extra salt to his french fries. He also went to [facility]. Patient denies chest pain, palpitations, he says his dyspnea and edema has improved.    Past Medical History:  Diagnosis Date  . AICD (automatic cardioverter/defibrillator) present 05/18/2016  . CAD (coronary artery disease)   . Cardiomyopathy, ischemic   . CHF (congestive heart failure) ([place])   . Chronic back pain   . Diabetes mellitus without complication (HCC)    prediabetes  . Elevated PSA   . Hyperlipidemia   . Morbid obesity ([place])   . OSA (obstructive sleep apnea)    does not wear CPAP, thinks one has been ordered    Past Surgical History:  Procedure Laterality Date    . APPENDECTOMY    . CAROTID STENT    . CATARACT EXTRACTION, BILATERAL    . CORONARY ARTERY BYPASS GRAFT    . EP IMPLANTABLE DEVICE N/A 05/18/2016   Procedure: ICD Implant;  Surgeon: Will Meredith Leeds, MD;  Location: [facility];  Service: Cardiovascular;  Laterality: N/A;  . ingrown toenail N/A   . LEFT HEART CATHETERIZATION WITH CORONARY/GRAFT ANGIOGRAM N/A 12/21/2014   Procedure: LEFT HEART CATHETERIZATION WITH Beatrix Fetters;  Surgeon: Belva Crome, MD;  Location: [facility] CATH LAB;  Service: Cardiovascular;  Laterality: N/A;    Current Medications: Outpatient Medications Prior to Visit  Medication Sig Dispense Refill  . aspirin 325 MG tablet Take 325 mg by mouth daily.    . carvedilol (COREG) 6.25 MG tablet Take 1 tablet (6.25 mg total) by mouth 2 (two) times daily with a meal. 60 tablet 0  . clopidogrel (PLAVIX) 75 MG tablet Take 1 tablet (75 mg total) by mouth daily. 90 tablet 3  . HYDROcodone-acetaminophen (NORCO) 10-325 MG tablet Take 1 tablet by mouth every 8 (eight) hours as needed. 30 tablet 0  . Multiple Vitamins-Minerals (CENTRUM SILVER PO) Take 1 tablet by mouth daily.     . nitroGLYCERIN (NITROSTAT) 0.4 MG SL tablet PLACE 1 TABLET UNDER TONGUE FOR CHEST PAIN. MAY REPEAT EVERY 5 MIN UPTO 3 DOSES-NO RELIEF,CALL 911. 25 tablet 6  . polycarbophil (FIBERCON) 625 MG  tablet Take 2 tablets (1,250 mg total) by mouth daily with lunch. Hold if you have diarrhea 30 tablet 0  . rosuvastatin (CRESTOR) 40 MG tablet Take 1 tablet (40 mg total) by mouth daily. 90 tablet 2  . torsemide (DEMADEX) 20 MG tablet Take 2 tablets (40 mg total) by mouth 2 (two) times daily. 180 tablet 3  . trolamine salicylate (ASPERCREME) 10 % cream Apply 1 application topically as needed for muscle pain.    . metolazone (ZAROXOLYN) 2.5 MG tablet One q am as directed 30 tablet 3  . metolazone (ZAROXOLYN) 2.5 MG tablet Take 1 tablet (2.5 mg total) by mouth daily. Take 1 tablet every Friday. 30 tablet 3   . potassium chloride SA (K-DUR,KLOR-CON) 20 MEQ tablet Take 1 tablet (20 mEq total) by mouth 2 (two) times daily. 60 tablet 0   No facility-administered medications prior to visit.      Allergies:   Dyflex-g [dyphylline-guaifenesin]   Social History   Social History  . Marital status: Married    Spouse name: N/A  . Number of children: N/A  . Years of education: N/A   Occupational History  . retired    Social History Main Topics  . Smoking status: Former Research scientist (life sciences)  . Smokeless tobacco: Never Used  . Alcohol use No  . Drug use: No  . Sexual activity: Not Asked   Other Topics Concern  . None   Social History Narrative  . None     Family History:  The patient's family history includes CAD in his mother; Diabetes in his mother. He was adopted.   ROS:   Please see the history of present illness.    Review of Systems  Constitution: Positive for malaise/fatigue.  HENT: Negative.   Cardiovascular: Positive for dyspnea on exertion and leg swelling.  Respiratory: Negative.   Endocrine: Negative.   Hematologic/Lymphatic: Negative.   Musculoskeletal: Negative.   Gastrointestinal: Negative.   Genitourinary: Negative.   Neurological: Negative.    All other systems reviewed and are negative.   PHYSICAL EXAM:   VS:  BP 122/62   Pulse (!) 46   Ht 5' 10.5" (1.791 m)   Wt 278 lb (126.1 kg)   SpO2 90%   BMI 39.33 kg/m   Physical Exam  GEN: Obese, in no acute distress  Neck: no JVD, carotid bruits, or masses Cardiac:RRR; 2/6 systolic murmur at the left sternal border, no rubs, or gallops  Respiratory:  clear to auscultation bilaterally, normal work of breathing GI: soft, nontender, nondistended, + BS Ext: Trace of edema bilaterally otherwise without cyanosis, clubbing, Good distal pulses bilaterally Psych: euthymic mood, full affect  Wt Readings from Last 3 Encounters:  11/08/16 278 lb (126.1 kg)  10/24/16 293 lb (132.9 kg)  10/13/16 279 lb (126.6 kg)       Studies/Labs Reviewed:   EKG:  EKG is not ordered today.    Recent Labs: 07/11/2016: ALT 20 09/11/2016: B Natriuretic Peptide 733.0; TSH 3.363 09/12/2016: Hemoglobin 14.1; Platelets 261 11/07/2016: BUN 27; Creat 1.26; Magnesium 2.4; Potassium 2.7; Sodium 135   Lipid Panel    Component Value Date/Time   CHOL 151 07/11/2016 1043   TRIG 162 (H) 07/11/2016 1043   HDL 36 (L) 07/11/2016 1043   CHOLHDL 4.2 07/11/2016 1043   CHOLHDL 3.9 01/02/2015 1010   VLDL 31 01/02/2015 1010   LDLCALC 83 07/11/2016 1043    Additional studies/ records that were reviewed today include:   Jan 2018 echo Study Conclusions   -  Left ventricle: The cavity size was normal. Wall thickness was   normal. The estimated ejection fraction was in the range of 10%   to 15%. Diffuse hypokinesis. Doppler parameters are consistent   with restrictive physiology, indicative of decreased left   ventricular diastolic compliance and/or increased left atrial   pressure. Doppler parameters are consistent with high ventricular   filling pressure. - Aortic valve: Moderately calcified annulus. Moderately thickened   leaflets. There was moderate stenosis. AV gradients decreased in   the setting of severe LV systolic dysfunction. AVA VTI and   dimensionless index support moderate aortic stenosis. Mean   gradient (S): 17 mm Hg. Valve area (VTI): 1.2 cm^2. Valve area   (Vmax): 1.1 cm^2. - Left atrium: The atrium was severely dilated. - Pulmonary arteries: Systolic pressure was moderately increased.   PA peak pressure: 58 mm Hg (S). - Inferior vena cava: The vessel was dilated. The respirophasic   diameter changes were blunted (< 50%), consistent with elevated   central venous pressure. - Technically difficult study. Echocontrast was used to enhance   visualization.      ASSESSMENT:    1. Hypokalemia   2. ICD (implantable cardioverter-defibrillator) discharge   3. Chronic combined systolic and diastolic congestive  heart failure ([place])   4. Essential hypertension   5. Type 2 diabetes mellitus without complication, without long-term current use of insulin ([place])   6. CKD (chronic kidney disease) stage 3, GFR 30-59 ml/min      PLAN:  In order of problems listed above:  Hypokalemia potassium 2.7 and ICD shocked him last night. Patient did not feel this. We'll replace potassium today 20 mEq 2 tablets now 2 tablets in 2 hours and 2 tablets in 4 hours. Increase K Dur 20 mEq 2 tablets twice a day. Stop metolazone. Call if weight gain of 2 or 3 pounds overnight and can take metolazone when necessary. Check labs Friday.   ICD status post shock last night. Replacing potassium  Chronic combined systolic and diastolic CHF patient has lost 15 pounds with torsemide and Zaroxolyn. He still is eating out and getting extra salt. 2 g sodium diet reiterated. Stop metolazone unless he has weight gain of 2 or 3 pounds overnight and calls. Follow-up with Dr. branch in one month.  Essential hypertension controlled  Type 2 diabetes mellitus patient does not take any medications for this. We'll forward his labs to Dr. Sallee [name] his primary care for management.   CK D stage III creatinine was 1.26 on 11/07/16 which is stable. rechecking labs Friday.    Medication Adjustments/Labs and Tests Ordered: Current medicines are reviewed at length with the patient today.  Concerns regarding medicines are outlined above.  Medication changes, Labs and Tests ordered today are listed in the Patient Instructions below. Patient Instructions  Your physician recommends that you schedule a follow-up appointment in: 2 Weeks Dr. Harl Bowie   Your physician has recommended you make the following change in your medication:  Take 2  Potassium Now. Take 2 Potassium in 2 Hours (3:30 pm) Take 2 Potassium in 4 Hours ( 5:30 PM)    Potassium 40 mEq Two Times Daily Starting Tomorrow 11/09/16   Stop Taking Metolazone   Call our office if you have a  weight gain of 2 pounds or more over night or 5 pounds in one week.   Your physician recommends that you return for lab work in: Friday  If you need a refill on your cardiac medications before your  next appointment, please call your pharmacy.  Thank you for choosing [facility]!         Signed, Ermalinda Barrios, PA-C  11/08/2016 2:19 PM    [facility] [address], [place], [place]  [address] Phone: [phone]; Fax: [phone]

## 2016-11-10 LAB — BASIC METABOLIC PANEL
BUN: 22 mg/dL (ref 7–25)
CALCIUM: 9.2 mg/dL (ref 8.6–10.3)
CO2: 44 mmol/L — AB (ref 20–31)
CREATININE: 1.02 mg/dL (ref 0.70–1.25)
Chloride: 86 mmol/L — ABNORMAL LOW (ref 98–110)
Glucose, Bld: 347 mg/dL — ABNORMAL HIGH (ref 65–99)
POTASSIUM: 3.2 mmol/L — AB (ref 3.5–5.3)
SODIUM: 137 mmol/L (ref 135–146)

## 2016-11-13 ENCOUNTER — Telehealth: Payer: Self-pay

## 2016-11-13 DIAGNOSIS — E876 Hypokalemia: Secondary | ICD-10-CM

## 2016-11-13 NOTE — Telephone Encounter (Signed)
-----   Message from Arnoldo Lenis, MD sent at 11/13/2016  2:14 PM EDT ----- Potassium is improving but not back to normal. Repeat labs in 1 week with BMET and Mg   Zandra Abts MD

## 2016-11-13 NOTE — Telephone Encounter (Signed)
[facility] informed of lab results, Patient asked me to speak with wife, she will take pt to [facility] labs in 1 week for repeat labs,order placed for bmet and magnesium

## 2016-11-20 DIAGNOSIS — E876 Hypokalemia: Secondary | ICD-10-CM | POA: Diagnosis not present

## 2016-11-21 LAB — MAGNESIUM: MAGNESIUM: 2.3 mg/dL (ref 1.5–2.5)

## 2016-11-21 LAB — BASIC METABOLIC PANEL
BUN: 15 mg/dL (ref 7–25)
CALCIUM: 8.8 mg/dL (ref 8.6–10.3)
CO2: 27 mmol/L (ref 20–31)
Chloride: 102 mmol/L (ref 98–110)
Creat: 1.02 mg/dL (ref 0.70–1.25)
Glucose, Bld: 381 mg/dL — ABNORMAL HIGH (ref 65–99)
Potassium: 4.7 mmol/L (ref 3.5–5.3)
SODIUM: 137 mmol/L (ref 135–146)

## 2016-11-22 ENCOUNTER — Encounter: Payer: Self-pay | Admitting: Physician Assistant

## 2016-11-22 ENCOUNTER — Ambulatory Visit (INDEPENDENT_AMBULATORY_CARE_PROVIDER_SITE_OTHER): Payer: Medicare Other | Admitting: Physician Assistant

## 2016-11-22 ENCOUNTER — Ambulatory Visit (INDEPENDENT_AMBULATORY_CARE_PROVIDER_SITE_OTHER): Payer: Medicare Other | Admitting: Urology

## 2016-11-22 VITALS — BP 110/74 | HR 92 | Ht 70.5 in | Wt 278.6 lb

## 2016-11-22 DIAGNOSIS — N183 Chronic kidney disease, stage 3 unspecified: Secondary | ICD-10-CM

## 2016-11-22 DIAGNOSIS — Z95810 Presence of automatic (implantable) cardiac defibrillator: Secondary | ICD-10-CM | POA: Diagnosis not present

## 2016-11-22 DIAGNOSIS — I2583 Coronary atherosclerosis due to lipid rich plaque: Secondary | ICD-10-CM

## 2016-11-22 DIAGNOSIS — R351 Nocturia: Secondary | ICD-10-CM | POA: Diagnosis not present

## 2016-11-22 DIAGNOSIS — N476 Balanoposthitis: Secondary | ICD-10-CM

## 2016-11-22 DIAGNOSIS — I2510 Atherosclerotic heart disease of native coronary artery without angina pectoris: Secondary | ICD-10-CM | POA: Diagnosis not present

## 2016-11-22 DIAGNOSIS — R972 Elevated prostate specific antigen [PSA]: Secondary | ICD-10-CM | POA: Diagnosis not present

## 2016-11-22 DIAGNOSIS — I5042 Chronic combined systolic (congestive) and diastolic (congestive) heart failure: Secondary | ICD-10-CM

## 2016-11-22 DIAGNOSIS — I10 Essential (primary) hypertension: Secondary | ICD-10-CM

## 2016-11-22 NOTE — Progress Notes (Signed)
Cardiology Office Note    Date:  11/22/2016   ID:  [name] [name], DOB 1950/09/24, MRN [id number]  PCP:  Sallee Lange, MD  Cardiologist: Dr. Harl Bowie  No chief complaint on file.   History of Present Illness:  [name] [name] is a 66 y.o. male with history of chronic systolic heart failure, ischemic cardiomyopathy LVEF 10-15% by echo 08/2016 with restrictive diastolic dysfunction. Patient has ICD followed by EPS. He had an admission January 2018 with acute on chronic CHF and diuresed nearly 15 pounds. Discharge weight was 297 pounds. Weight was 279 pounds 10/13/16. Daily metolazone was stopped and weight was up to 293 pounds 10/24/16 when he saw Dr. Harl Bowie. Patient also has CAD status post CABG in 2001, hypertension, HLD, moderate aortic stenosis, OSA on CPAP.   Dr. Harl Bowie changed his Lasix to torsemide 40 mg twice a day and change metolazone to 2.5 mg every Friday. I saw the patient 11/08/16 and his potassium came back 2.7, BS 430. Patient also received a call  because his ICD shocked him at 11:24 PM. He says was dizzy and his legs were wobbly  but he did not feel the shock. He brings in a record of his weights from home. When he started losing weight he decided to celebrate and go out to eat at [employer] and added extra salt to his french fries. He also went to [facility].    Weight was 278 pounds. He had lost 15 pounds. I replace his potassium and stopped his metolazone. 2 g sodium diet was reiterated. Labs 11/20/16 potassium 4.7 creatinine 1.02 magnesium 2.3 glucose 381.   Patient comes in today accompanied by his wife. His weight is 278 pounds. He's been able to keep the fluid off without metolazone. He sees Dr.Luking next week and will need tighter diabetes control. He says he's felt better the past 4 days and he has in the long time.    Past Medical History:  Diagnosis Date  . AICD (automatic cardioverter/defibrillator) present 05/18/2016  . CAD (coronary artery disease)   . Cardiomyopathy,  ischemic   . CHF (congestive heart failure) ([place])   . Chronic back pain   . Diabetes mellitus without complication (HCC)    prediabetes  . Elevated PSA   . Hyperlipidemia   . Morbid obesity ([place])   . OSA (obstructive sleep apnea)    does not wear CPAP, thinks one has been ordered    Past Surgical History:  Procedure Laterality Date  . APPENDECTOMY    . CAROTID STENT    . CATARACT EXTRACTION, BILATERAL    . CORONARY ARTERY BYPASS GRAFT    . EP IMPLANTABLE DEVICE N/A 05/18/2016   Procedure: ICD Implant;  Surgeon: Will Meredith Leeds, MD;  Location: [facility];  Service: Cardiovascular;  Laterality: N/A;  . ingrown toenail N/A   . LEFT HEART CATHETERIZATION WITH CORONARY/GRAFT ANGIOGRAM N/A 12/21/2014   Procedure: LEFT HEART CATHETERIZATION WITH Beatrix Fetters;  Surgeon: Belva Crome, MD;  Location: [facility] CATH LAB;  Service: Cardiovascular;  Laterality: N/A;    Current Medications: Outpatient Medications Prior to Visit  Medication Sig Dispense Refill  . aspirin 325 MG tablet Take 325 mg by mouth daily.    . carvedilol (COREG) 6.25 MG tablet Take 1 tablet (6.25 mg total) by mouth 2 (two) times daily with a meal. 60 tablet 0  . clopidogrel (PLAVIX) 75 MG tablet Take 1 tablet (75 mg total) by mouth daily. 90 tablet 3  . HYDROcodone-acetaminophen (  NORCO) 10-325 MG tablet Take 1 tablet by mouth every 8 (eight) hours as needed. 30 tablet 0  . Multiple Vitamins-Minerals (CENTRUM SILVER PO) Take 1 tablet by mouth daily.     . nitroGLYCERIN (NITROSTAT) 0.4 MG SL tablet PLACE 1 TABLET UNDER TONGUE FOR CHEST PAIN. MAY REPEAT EVERY 5 MIN UPTO 3 DOSES-NO RELIEF,CALL 911. 25 tablet 6  . polycarbophil (FIBERCON) 625 MG tablet Take 2 tablets (1,250 mg total) by mouth daily with lunch. Hold if you have diarrhea 30 tablet 0  . potassium chloride SA (K-DUR,KLOR-CON) 20 MEQ tablet Take 2 tablets (40 mEq total) by mouth 2 (two) times daily. 360 tablet 3  . rosuvastatin (CRESTOR) 40 MG  tablet Take 1 tablet (40 mg total) by mouth daily. 90 tablet 2  . torsemide (DEMADEX) 20 MG tablet Take 2 tablets (40 mg total) by mouth 2 (two) times daily. 180 tablet 3  . trolamine salicylate (ASPERCREME) 10 % cream Apply 1 application topically as needed for muscle pain.     No facility-administered medications prior to visit.      Allergies:   Dyflex-g [dyphylline-guaifenesin]   Social History   Social History  . Marital status: Married    Spouse name: N/A  . Number of children: N/A  . Years of education: N/A   Occupational History  . retired    Social History Main Topics  . Smoking status: Former Smoker    Quit date: 08/29/1999  . Smokeless tobacco: Never Used     Comment: couldn't remember date  . Alcohol use No  . Drug use: No  . Sexual activity: Not Asked   Other Topics Concern  . None   Social History Narrative  . None     Family History:  The patient's family history includes CAD in his mother; Diabetes in his mother. He was adopted.   ROS:   Please see the history of present illness.    Review of Systems  Constitution: Negative.  HENT: Negative.   Cardiovascular: Negative.   Respiratory: Negative.   Endocrine: Negative.   Hematologic/Lymphatic: Negative.   Musculoskeletal: Negative.   Gastrointestinal: Negative.   Genitourinary: Positive for frequency.  Neurological: Negative.    All other systems reviewed and are negative.   PHYSICAL EXAM:   VS:  Ht 5' 10.5" (1.791 m)   Wt 278 lb 9.6 oz (126.4 kg)   BMI 39.41 kg/m   Physical Exam  GEN: Obese, in no acute distress  Neck: no JVD, carotid bruits, or masses Cardiac:RRR; no murmurs, rubs, or gallops  Respiratory:  clear to auscultation bilaterally, normal work of breathing GI: soft, nontender, nondistended, + BS Ext: without cyanosis, clubbing, or edema, Good distal pulses bilaterally Psych: euthymic mood, full affect  Wt Readings from Last 3 Encounters:  11/22/16 278 lb 9.6 oz (126.4 kg)    11/08/16 278 lb (126.1 kg)  10/24/16 293 lb (132.9 kg)      Studies/Labs Reviewed:   EKG:  EKG is not ordered today.    Recent Labs: 07/11/2016: ALT 20 09/11/2016: B Natriuretic Peptide 733.0; TSH 3.363 09/12/2016: Hemoglobin 14.1; Platelets 261 11/20/2016: BUN 15; Creat 1.02; Magnesium 2.3; Potassium 4.7; Sodium 137   Lipid Panel    Component Value Date/Time   CHOL 151 07/11/2016 1043   TRIG 162 (H) 07/11/2016 1043   HDL 36 (L) 07/11/2016 1043   CHOLHDL 4.2 07/11/2016 1043   CHOLHDL 3.9 01/02/2015 1010   VLDL 31 01/02/2015 1010   LDLCALC 83 07/11/2016 1043  Additional studies/ records that were reviewed today include:  Jan 2018 echo Study Conclusions   - Left ventricle: The cavity size was normal. Wall thickness was   normal. The estimated ejection fraction was in the range of 10%   to 15%. Diffuse hypokinesis. Doppler parameters are consistent   with restrictive physiology, indicative of decreased left   ventricular diastolic compliance and/or increased left atrial   pressure. Doppler parameters are consistent with high ventricular   filling pressure. - Aortic valve: Moderately calcified annulus. Moderately thickened   leaflets. There was moderate stenosis. AV gradients decreased in   the setting of severe LV systolic dysfunction. AVA VTI and   dimensionless index support moderate aortic stenosis. Mean   gradient (S): 17 mm Hg. Valve area (VTI): 1.2 cm^2. Valve area   (Vmax): 1.1 cm^2. - Left atrium: The atrium was severely dilated. - Pulmonary arteries: Systolic pressure was moderately increased.   PA peak pressure: 58 mm Hg (S). - Inferior vena cava: The vessel was dilated. The respirophasic   diameter changes were blunted (< 50%), consistent with elevated   central venous pressure. - Technically difficult study. Echocontrast was used to enhance   visualization.           ASSESSMENT:    1. Chronic combined systolic and diastolic congestive heart  failure ([place])   2. AICD (automatic cardioverter/defibrillator) present   3. Essential hypertension   4. Coronary artery disease due to lipid rich plaque   5. CKD (chronic kidney disease) stage 3, GFR 30-59 ml/min      PLAN:  In order of problems listed above:  Chronic combined systolic and diastolic CHF. EF 10-15%. Weight remains stable at 278 pounds since we stopped the metolazone. Renal function and potassium are stable. Continue 2 g sodium diet. Follow-up with Dr. Harl Bowie in 6 weeks.  ICD no further shocks. Shock occurred when potassium was 2.7. Follow-up with Dr. Lovena Le. Remote check 11/27/16.  Essential hypertension controlled CKD stage III creatinine 1.02 on 11/20/16  Diabetes mellitus with sugars continuously in the 300-400 range recommend follow-up with Dr. Wolfgang Phoenix for tighter control. He has appt next week.      Medication Adjustments/Labs and Tests Ordered: Current medicines are reviewed at length with the patient today.  Concerns regarding medicines are outlined above.  Medication changes, Labs and Tests ordered today are listed in the Patient Instructions below. There are no Patient Instructions on file for this visit.   Signed, Ermalinda Barrios, PA-C  11/22/2016 1:54 PM    [facility] [place], [place], [place]  [address] Phone: [phone]; Fax: [phone]

## 2016-11-22 NOTE — Patient Instructions (Signed)
Your physician recommends that you schedule a follow-up appointment in: 6 Weeks with Dr. Harl Bowie  Your physician recommends that you continue on your current medications as directed. Please refer to the Current Medication list given to you today.  If you need a refill on your cardiac medications before your next appointment, please call your pharmacy.  Thank you for choosing [facility]!   Heart-Healthy Eating Plan Many factors influence your heart health, including eating and exercise habits. Heart (coronary) risk increases with abnormal blood fat (lipid) levels. Heart-healthy meal planning includes limiting unhealthy fats, increasing healthy fats, and making other small dietary changes. This includes maintaining a healthy body weight to help keep lipid levels within a normal range. What is my plan? Your health care provider recommends that you:  Get no more than _________% of the total calories in your daily diet from fat.  Limit your intake of saturated fat to less than _________% of your total calories each day.  Limit the amount of cholesterol in your diet to less than _________ mg per day. What types of fat should I choose?  Choose healthy fats more often. Choose monounsaturated and polyunsaturated fats, such as olive oil and canola oil, flaxseeds, walnuts, almonds, and seeds.  Eat more omega-3 fats. Good choices include salmon, mackerel, sardines, tuna, flaxseed oil, and ground flaxseeds. Aim to eat fish at least two times each week.  Limit saturated fats. Saturated fats are primarily found in animal products, such as meats, butter, and cream. Plant sources of saturated fats include palm oil, palm kernel oil, and coconut oil.  Avoid foods with partially hydrogenated oils in them. These contain trans fats. Examples of foods that contain trans fats are stick margarine, some tub margarines, cookies, crackers, and other baked goods. What general guidelines do I need to  follow?  Check food labels carefully to identify foods with trans fats or high amounts of saturated fat.  Fill one half of your plate with vegetables and green salads. Eat 4-5 servings of vegetables per day. A serving of vegetables equals 1 cup of raw leafy vegetables,  cup of raw or cooked cut-up vegetables, or  cup of vegetable juice.  Fill one fourth of your plate with whole grains. Look for the word "whole" as the first word in the ingredient list.  Fill one fourth of your plate with lean protein foods.  Eat 4-5 servings of fruit per day. A serving of fruit equals one medium whole fruit,  cup of dried fruit,  cup of fresh, frozen, or canned fruit, or  cup of 100% fruit juice.  Eat more foods that contain soluble fiber. Examples of foods that contain this type of fiber are apples, broccoli, carrots, beans, peas, and barley. Aim to get 20-30 g of fiber per day.  Eat more home-cooked food and less restaurant, buffet, and fast food.  Limit or avoid alcohol.  Limit foods that are high in starch and sugar.  Avoid fried foods.  Cook foods by using methods other than frying. Baking, boiling, grilling, and broiling are all great options. Other fat-reducing suggestions include:  Removing the skin from poultry.  Removing all visible fats from meats.  Skimming the fat off of stews, soups, and gravies before serving them.  Steaming vegetables in water or broth.  Lose weight if you are overweight. Losing just 5-10% of your initial body weight can help your overall health and prevent diseases such as diabetes and heart disease.  Increase your consumption of nuts,  legumes, and seeds to 4-5 servings per week. One serving of dried beans or legumes equals  cup after being cooked, one serving of nuts equals 1 ounces, and one serving of seeds equals  ounce or 1 tablespoon.  You may need to monitor your salt (sodium) intake, especially if you have high blood pressure. Talk with your health  care provider or dietitian to get more information about reducing sodium. What foods can I eat? Grains   Breads, including [place], white, pita, wheat, raisin, rye, oatmeal, and [place]. Tortillas that are neither fried nor made with lard or trans fat. Low-fat rolls, including hotdog and hamburger buns and English muffins. Biscuits. Muffins. Waffles. Pancakes. Light popcorn. Whole-grain cereals. Flatbread. Melba toast. Pretzels. Breadsticks. Rusks. Low-fat snacks and crackers, including oyster, saltine, matzo, graham, animal, and rye. Rice and pasta, including brown rice and those that are made with whole wheat. Vegetables  All vegetables. Fruits  All fruits, but limit coconut. Meats and Other Protein Sources  Lean, well-trimmed beef, veal, pork, and lamb. Chicken and [place] without skin. All fish and shellfish. Wild duck, rabbit, pheasant, and venison. Egg whites or low-cholesterol egg substitutes. Dried beans, peas, lentils, and tofu.Seeds and most nuts. Dairy  Low-fat or nonfat cheeses, including ricotta, string, and mozzarella. Skim or 1% milk that is liquid, powdered, or evaporated. Buttermilk that is made with low-fat milk. Nonfat or low-fat yogurt. Beverages  Mineral water. Diet carbonated beverages. Sweets and Desserts  Sherbets and fruit ices. Honey, jam, marmalade, jelly, and syrups. Meringues and gelatins. Pure sugar candy, such as hard candy, jelly beans, gumdrops, mints, marshmallows, and small amounts of dark chocolate. [employer] Eat all sweets and desserts in moderation. Fats and Oils  Nonhydrogenated (trans-free) margarines. Vegetable oils, including soybean, sesame, sunflower, olive, peanut, safflower, corn, canola, and cottonseed. Salad dressings or mayonnaise that are made with a vegetable oil. Limit added fats and oils that you use for cooking, baking, salads, and as spreads. Other  Cocoa powder. Coffee and tea. All seasonings and condiments. The items listed above  may not be a complete list of recommended foods or beverages. Contact your dietitian for more options.  What foods are not recommended? Grains  Breads that are made with saturated or trans fats, oils, or whole milk. Croissants. Butter rolls. Cheese breads. Sweet rolls. Donuts. Buttered popcorn. Chow mein noodles. High-fat crackers, such as cheese or butter crackers. Meats and Other Protein Sources  Fatty meats, such as hotdogs, short ribs, sausage, spareribs, bacon, ribeye roast or steak, and mutton. High-fat deli meats, such as salami and bologna. Caviar. Domestic duck and goose. Organ meats, such as kidney, liver, sweetbreads, brains, gizzard, chitterlings, and heart. Dairy  Cream, sour cream, cream cheese, and creamed cottage cheese. Whole milk cheeses, including blue (bleu), Monte[place] Jack, [place], [place], American, [place], [place], [place], [place], and [place]. Whole or 2% milk that is liquid, evaporated, or condensed. Whole buttermilk. Cream sauce or high-fat cheese sauce. Yogurt that is made from whole milk. Beverages  Regular sodas and drinks with added sugar. Sweets and Desserts  Frosting. Pudding. Cookies. Cakes other than angel food cake. Candy that has milk chocolate or white chocolate, hydrogenated fat, butter, coconut, or unknown ingredients. Buttered syrups. Full-fat ice cream or ice cream drinks. Fats and Oils  Gravy that has suet, meat fat, or shortening. Cocoa butter, hydrogenated oils, palm oil, coconut oil, palm kernel oil. These can often be found in baked products, candy, fried foods, nondairy creamers, and whipped toppings. Solid fats and  shortenings, including bacon fat, salt pork, lard, and butter. Nondairy cream substitutes, such as coffee creamers and sour cream substitutes. Salad dressings that are made of unknown oils, cheese, or sour cream. The items listed above may not be a complete list of foods and beverages to avoid. Contact your dietitian for more information.  This  information is not intended to replace advice given to you by your health care provider. Make sure you discuss any questions you have with your health care provider. Document Released: 05/23/2008 Document Revised: 03/03/2016 Document Reviewed: 02/05/2014 Elsevier Interactive Patient Education  2017 [employer].

## 2016-11-23 ENCOUNTER — Other Ambulatory Visit (INDEPENDENT_AMBULATORY_CARE_PROVIDER_SITE_OTHER): Payer: Self-pay | Admitting: Otolaryngology

## 2016-11-23 ENCOUNTER — Ambulatory Visit (INDEPENDENT_AMBULATORY_CARE_PROVIDER_SITE_OTHER): Payer: Medicare Other | Admitting: Otolaryngology

## 2016-11-23 ENCOUNTER — Telehealth: Payer: Self-pay | Admitting: Family Medicine

## 2016-11-23 DIAGNOSIS — J343 Hypertrophy of nasal turbinates: Secondary | ICD-10-CM | POA: Diagnosis not present

## 2016-11-23 DIAGNOSIS — J310 Chronic rhinitis: Secondary | ICD-10-CM | POA: Diagnosis not present

## 2016-11-23 DIAGNOSIS — J342 Deviated nasal septum: Secondary | ICD-10-CM

## 2016-11-23 DIAGNOSIS — J0101 Acute recurrent maxillary sinusitis: Secondary | ICD-10-CM

## 2016-11-23 NOTE — Telephone Encounter (Signed)
Patient wanted to let Dr. Nicki [name] know that he saw Dr. Benjamine [name] today and they found out why he has been having trouble breathing.  He said it'll probably require x-ray and scans, but they seem to think they can help him out.  He wanted to thank Dr. Nicki [name] for referring him to Dr. Benjamine [name].

## 2016-11-27 ENCOUNTER — Telehealth: Payer: Self-pay | Admitting: Cardiology

## 2016-11-27 ENCOUNTER — Ambulatory Visit (INDEPENDENT_AMBULATORY_CARE_PROVIDER_SITE_OTHER): Payer: Medicare Other | Admitting: *Deleted

## 2016-11-27 DIAGNOSIS — I255 Ischemic cardiomyopathy: Secondary | ICD-10-CM

## 2016-11-27 NOTE — Progress Notes (Signed)
Remote ICD transmission.   

## 2016-11-27 NOTE — Telephone Encounter (Signed)
Spoke with pt and reminded pt of remote transmission that is due today. Pt verbalized understanding.   

## 2016-11-28 ENCOUNTER — Encounter: Payer: Self-pay | Admitting: Cardiology

## 2016-11-29 LAB — CUP PACEART REMOTE DEVICE CHECK
Battery Voltage: 3.07 V
Brady Statistic RV Percent Paced: 0 %
Date Time Interrogation Session: 20180402152209
HighPow Impedance: 84 Ohm
Implantable Lead Location: 753860
Implantable Pulse Generator Implant Date: 20170921
Lead Channel Impedance Value: 456 Ohm
Lead Channel Impedance Value: 532 Ohm
Lead Channel Sensing Intrinsic Amplitude: 7 mV
Lead Channel Setting Pacing Amplitude: 2.5 V
MDC IDC LEAD IMPLANT DT: 20170921
MDC IDC MSMT BATTERY REMAINING LONGEVITY: 135 mo
MDC IDC MSMT LEADCHNL RV PACING THRESHOLD AMPLITUDE: 0.5 V
MDC IDC MSMT LEADCHNL RV PACING THRESHOLD PULSEWIDTH: 0.4 ms
MDC IDC MSMT LEADCHNL RV SENSING INTR AMPL: 7 mV
MDC IDC SET LEADCHNL RV PACING PULSEWIDTH: 0.4 ms
MDC IDC SET LEADCHNL RV SENSING SENSITIVITY: 0.3 mV

## 2016-11-30 ENCOUNTER — Ambulatory Visit: Payer: Medicare Other | Admitting: Family Medicine

## 2016-11-30 ENCOUNTER — Ambulatory Visit (HOSPITAL_COMMUNITY)
Admission: RE | Admit: 2016-11-30 | Discharge: 2016-11-30 | Disposition: A | Discharge status: Home / Self Care | Payer: Medicare Other | Source: Ambulatory Visit | Attending: Otolaryngology | Admitting: Otolaryngology

## 2016-11-30 DIAGNOSIS — J343 Hypertrophy of nasal turbinates: Secondary | ICD-10-CM | POA: Insufficient documentation

## 2016-11-30 DIAGNOSIS — J0101 Acute recurrent maxillary sinusitis: Secondary | ICD-10-CM | POA: Diagnosis not present

## 2016-11-30 DIAGNOSIS — J329 Chronic sinusitis, unspecified: Secondary | ICD-10-CM | POA: Diagnosis not present

## 2016-12-01 ENCOUNTER — Ambulatory Visit (INDEPENDENT_AMBULATORY_CARE_PROVIDER_SITE_OTHER): Payer: Medicare Other | Admitting: Family Medicine

## 2016-12-01 ENCOUNTER — Encounter: Payer: Self-pay | Admitting: Family Medicine

## 2016-12-01 ENCOUNTER — Telehealth: Payer: Self-pay

## 2016-12-01 VITALS — BP 118/78 | HR 84 | Ht 70.5 in | Wt 271.0 lb

## 2016-12-01 DIAGNOSIS — M545 Low back pain, unspecified: Secondary | ICD-10-CM

## 2016-12-01 DIAGNOSIS — I5042 Chronic combined systolic (congestive) and diastolic (congestive) heart failure: Secondary | ICD-10-CM | POA: Diagnosis not present

## 2016-12-01 DIAGNOSIS — G8929 Other chronic pain: Secondary | ICD-10-CM | POA: Diagnosis not present

## 2016-12-01 DIAGNOSIS — M25511 Pain in right shoulder: Secondary | ICD-10-CM

## 2016-12-01 DIAGNOSIS — I10 Essential (primary) hypertension: Secondary | ICD-10-CM

## 2016-12-01 DIAGNOSIS — M25512 Pain in left shoulder: Secondary | ICD-10-CM

## 2016-12-01 DIAGNOSIS — I255 Ischemic cardiomyopathy: Secondary | ICD-10-CM | POA: Diagnosis not present

## 2016-12-01 MED ORDER — HYDROCODONE-ACETAMINOPHEN 10-325 MG PO TABS: ORAL_TABLET | ORAL | 0 refills | Status: DC

## 2016-12-01 NOTE — Progress Notes (Signed)
   Subjective:    Patient ID: [name] [name], male    DOB: Sep 10, 1950, 66 y.o.   MRN: [id number]  Hypertension  This is a chronic problem. The current episode started more than 1 year ago. Pertinent negatives include no chest pain, headaches or shortness of breath.   Takes hydrocodone for right foot pain, bilateral shoulder pain. Wants to get pain med increased. Does not take med every day.  Patient states he takes 2 pain pills at a time but denies using it excessively he states maybe 2 a day denies drowsiness or constipation His CHF is stable. Denies any PND orthopnea. He is trying eat healthy and stay physically active He has had problems with low potassium of they've been following this and he is taking a supplement  Review of Systems  Constitutional: Negative for activity change, fatigue and fever.  Respiratory: Negative for cough and shortness of breath.   Cardiovascular: Negative for chest pain and leg swelling.  Neurological: Negative for headaches.       Objective:   Physical Exam  Constitutional: He appears well-nourished. No distress.  Cardiovascular: Normal rate, regular rhythm and normal heart sounds.   No murmur heard. Pulmonary/Chest: Effort normal and breath sounds normal. No respiratory distress.  Musculoskeletal: He exhibits no edema.  Lymphadenopathy:    He has no cervical adenopathy.  Neurological: He is alert.  Psychiatric: His behavior is normal.  Vitals reviewed.  His swelling is remarkably better       Assessment & Plan:  HTN good control continue current measures Chronic shoulder pain prescriptions given follow-up if ongoing troubles patient may take 2 at a time every day when necessary not use more than 2 per day  Stable chronic CHF continue current measures Hypokalemia continue potassium supplementation no lab work indicated today 25 minutes spent with patient in discussion of his health issues greater than half in discussion of questions

## 2016-12-01 NOTE — Telephone Encounter (Signed)
Referred to [facility]CM clinic by Chanetta Marshall, NP.  ICM intro call to patient and during conversation, patient disconnected the call.  Unable to enroll in [facility] clinic.

## 2016-12-04 ENCOUNTER — Ambulatory Visit (INDEPENDENT_AMBULATORY_CARE_PROVIDER_SITE_OTHER): Payer: Medicare Other | Admitting: Family Medicine

## 2016-12-04 ENCOUNTER — Telehealth: Payer: Self-pay | Admitting: Family Medicine

## 2016-12-04 ENCOUNTER — Encounter: Payer: Self-pay | Admitting: Family Medicine

## 2016-12-04 VITALS — BP 128/64 | Ht 70.5 in | Wt 271.0 lb

## 2016-12-04 DIAGNOSIS — E119 Type 2 diabetes mellitus without complications: Secondary | ICD-10-CM | POA: Diagnosis not present

## 2016-12-04 DIAGNOSIS — I255 Ischemic cardiomyopathy: Secondary | ICD-10-CM | POA: Diagnosis not present

## 2016-12-04 LAB — POCT GLYCOSYLATED HEMOGLOBIN (HGB A1C): Hemoglobin A1C: 11.4

## 2016-12-04 MED ORDER — INSULIN ASPART 100 UNIT/ML FLEXPEN: PEN_INJECTOR | SUBCUTANEOUS | 5 refills | Status: DC

## 2016-12-04 MED ORDER — INSULIN DETEMIR 100 UNIT/ML FLEXPEN: PEN_INJECTOR | SUBCUTANEOUS | 5 refills | Status: DC

## 2016-12-04 NOTE — Telephone Encounter (Signed)
Blood sugar was 381 on recent lab work thru cardiology(11/20/16) and 431 on lab work thru cardiology(11/07/16) also but patient only labeled as prediabetic and on no medication for diabetes

## 2016-12-04 NOTE — Telephone Encounter (Signed)
Patient said that his sugar was elevated to 479, but is has decreased to 449.  He wants to know what Dr. Nicki Reaper recommends?

## 2016-12-04 NOTE — Telephone Encounter (Signed)
Left message to return call 

## 2016-12-04 NOTE — Telephone Encounter (Signed)
Well, it doesn't appear he is prediabetic any longer. Let the patient know these glucose readings are significantly higher than they should be. He is not a good candidate for metformin because of his kidney function. I would recommend an office visit-I can see him tomorrow or later today we will need to check an A1c more than likely discuss starting insulin

## 2016-12-04 NOTE — Patient Instructions (Signed)
Diabetes Mellitus and Food It is important for you to manage your blood sugar (glucose) level. Your blood glucose level can be greatly affected by what you eat. Eating healthier foods in the appropriate amounts throughout the day at about the same time each day will help you control your blood glucose level. It can also help slow or prevent worsening of your diabetes mellitus. Healthy eating may even help you improve the level of your blood pressure and reach or maintain a healthy weight. General recommendations for healthful eating and cooking habits include:  Eating meals and snacks regularly. Avoid going long periods of time without eating to lose weight.  Eating a diet that consists mainly of plant-based foods, such as fruits, vegetables, nuts, legumes, and whole grains.  Using low-heat cooking methods, such as baking, instead of high-heat cooking methods, such as deep frying.  Work with your dietitian to make sure you understand how to use the Nutrition Facts information on food labels. How can food affect me? Carbohydrates Carbohydrates affect your blood glucose level more than any other type of food. Your dietitian will help you determine how many carbohydrates to eat at each meal and teach you how to count carbohydrates. Counting carbohydrates is important to keep your blood glucose at a healthy level, especially if you are using insulin or taking certain medicines for diabetes mellitus. Alcohol Alcohol can cause sudden decreases in blood glucose (hypoglycemia), especially if you use insulin or take certain medicines for diabetes mellitus. Hypoglycemia can be a life-threatening condition. Symptoms of hypoglycemia (sleepiness, dizziness, and disorientation) are similar to symptoms of having too much alcohol. If your health care provider has given you approval to drink alcohol, do so in moderation and use the following guidelines:  Women should not have more than one drink per day, and men  should not have more than two drinks per day. One drink is equal to: ? 12 oz of beer. ? 5 oz of wine. ? 1 oz of hard liquor.  Do not drink on an empty stomach.  Keep yourself hydrated. Have water, diet soda, or unsweetened iced tea.  Regular soda, juice, and other mixers might contain a lot of carbohydrates and should be counted.  What foods are not recommended? As you make food choices, it is important to remember that all foods are not the same. Some foods have fewer nutrients per serving than other foods, even though they might have the same number of calories or carbohydrates. It is difficult to get your body what it needs when you eat foods with fewer nutrients. Examples of foods that you should avoid that are high in calories and carbohydrates but low in nutrients include:  Trans fats (most processed foods list trans fats on the Nutrition Facts label).  Regular soda.  Juice.  Candy.  Sweets, such as cake, pie, doughnuts, and cookies.  Fried foods.  What foods can I eat? Eat nutrient-rich foods, which will nourish your body and keep you healthy. The food you should eat also will depend on several factors, including:  The calories you need.  The medicines you take.  Your weight.  Your blood glucose level.  Your blood pressure level.  Your cholesterol level.  You should eat a variety of foods, including:  Protein. ? Lean cuts of meat. ? Proteins low in saturated fats, such as fish, egg whites, and beans. Avoid processed meats.  Fruits and vegetables. ? Fruits and vegetables that may help control blood glucose levels, such as apples,   mangoes, and yams.  Dairy products. ? Choose fat-free or low-fat dairy products, such as milk, yogurt, and cheese.  Grains, bread, pasta, and rice. ? Choose whole grain products, such as multigrain bread, whole oats, and brown rice. These foods may help control blood pressure.  Fats. ? Foods containing healthful fats, such as  nuts, avocado, olive oil, canola oil, and fish.  Does everyone with diabetes mellitus have the same meal plan? Because every person with diabetes mellitus is different, there is not one meal plan that works for everyone. It is very important that you meet with a dietitian who will help you create a meal plan that is just right for you. This information is not intended to replace advice given to you by your health care provider. Make sure you discuss any questions you have with your health care provider. Document Released: 05/11/2005 Document Revised: 01/20/2016 Document Reviewed: 07/11/2013 Elsevier Interactive Patient Education  2017 [employer] Inc.  

## 2016-12-04 NOTE — Telephone Encounter (Signed)
Patient advised it doesn't appear he is prediabetic any longer. Let the patient know these glucose readings are significantly higher than they should be. He is not a good candidate for metformin because of his kidney function. Dr Nicki Reaper would recommend an office visit-Dr Nicki Reaper can see him tomorrow or later today we will need to check an A1c more than likely discuss starting insulin. Patient verbalized understanding and scheduled an office visit for today.

## 2016-12-04 NOTE — Progress Notes (Signed)
   Subjective:    Patient ID: [name] [name], male    DOB: August 26, 1951, 66 y.o.   MRN: [id number]  HPI  Patient in today to discuss new onset of diabetes.   Patient relates sugars been in the 400 range upward over the past several days increased thirst and increased urination no disorientation no double vision no vomiting or diarrhea or fevers States no other concerns of visit.   Review of Systems    denies chest pain shortness of breath Objective:   Physical Exam Lungs clear heart regular extremities no edema       Assessment & Plan:  20 minutes spent with patient regarding diabetes Additional information regarding diabetes given to the patient Patient does not want to go to diabetic education Patient is willing to go on insulin Long-acting insulin prescribed short acting insulin prescribed Start offlong-acting insulin to 12 units each long-acting insulin 12 CT Short acting insulin 6 units with each meal Cenestin readings within 1-2 weeks Follow-up 4 weeks Goals regarding lowering sugars were discussed in detail

## 2016-12-13 ENCOUNTER — Other Ambulatory Visit: Payer: Self-pay | Admitting: Cardiology

## 2016-12-21 ENCOUNTER — Ambulatory Visit (INDEPENDENT_AMBULATORY_CARE_PROVIDER_SITE_OTHER): Payer: Medicare Other | Admitting: Otolaryngology

## 2016-12-21 DIAGNOSIS — J342 Deviated nasal septum: Secondary | ICD-10-CM

## 2016-12-21 DIAGNOSIS — J343 Hypertrophy of nasal turbinates: Secondary | ICD-10-CM | POA: Diagnosis not present

## 2016-12-21 DIAGNOSIS — J310 Chronic rhinitis: Secondary | ICD-10-CM | POA: Diagnosis not present

## 2016-12-26 ENCOUNTER — Telehealth: Payer: Self-pay | Admitting: *Deleted

## 2016-12-26 NOTE — Telephone Encounter (Signed)
Pt dropped off blood sugar readings.

## 2016-12-27 NOTE — Telephone Encounter (Signed)
I recommended the patient increase to 18 units with meals and increase to 30 units at bedtime. Send [place] readings again in approximately a week. Please make the changes in Epic as well.

## 2016-12-27 NOTE — Telephone Encounter (Signed)
Spoke with patient and patient stated that he is taking 12 units of Novolog with meals, and 24 units of Levemir at bedtime. Please advise?

## 2016-12-27 NOTE — Telephone Encounter (Signed)
Discussed with Dr.Scott patient's blood sugars being below 200's in the past two days and was told to tell patient to keep insulin the same as he has been taking and send in glucose readings in 2 weeks. Informed patient and patient verbalized understanding.

## 2016-12-27 NOTE — Telephone Encounter (Signed)
I reviewed over his glucose sheets. It is hard to tell for certain how much insulin he is using. Verify the amount of insulin he is using as stated in Epic is reliable. If it is reliable please do the following. I would recommend with meals his current instructions as 6 units with meals. I believe he should move this up to 12 units with meals. His current instructions states 12 units in the evening time I believe the patient should move this up to 18 units in the evening time. He should send [place] additional readings in 7-10 days. If his numbers are staying in the 300s and 400s over the next 5-6 days he should let us know. Also any direction we give him over the phone please change Epic medication list to reflect the new amount he is taking thank you

## 2016-12-27 NOTE — Telephone Encounter (Signed)
Patient called back stating that the glucose readings that your received did not show his sugars for the day before yesterday. His blood sugars then were below 200 and he feels that how he is now taking his insulin is helping and getting his sugars in the right directions since they have been running in 100's the past two days.

## 2017-01-01 ENCOUNTER — Telehealth: Payer: Self-pay | Admitting: Family Medicine

## 2017-01-01 NOTE — Telephone Encounter (Signed)
Prescription faxed to pharmacy. Patient notified. 

## 2017-01-01 NOTE — Telephone Encounter (Signed)
Patient wife's calling.  He was just diagnoses with Diabetes and they have a machine but need test strips.  Wants One Touch Ultra sent to [facility].

## 2017-01-08 ENCOUNTER — Ambulatory Visit: Payer: BLUE CROSS/BLUE SHIELD | Admitting: Family Medicine

## 2017-01-09 ENCOUNTER — Telehealth: Payer: Self-pay | Admitting: Family Medicine

## 2017-01-09 NOTE — Telephone Encounter (Signed)
Pt dropped off his glucose readings. Readings are in red folder in office.

## 2017-01-12 NOTE — Telephone Encounter (Signed)
Please inform [name] I reviewed over his glucose readings according to last documentation a couple weeks ago he was using 12 units short acting with meals and 24 units long-acting in the evening. I would recommend going up to 28 units long-acting in the evening I also recommend going up to 14 units with meals. Please have the patient send [place] more readings in a few weeks time and on his glucose log nature the patient writes down how many units he is taking at mealtimes and how many at bedtime so we always have a accurate reflection of what he is doing

## 2017-01-12 NOTE — Telephone Encounter (Signed)
Patient advised Dr Nicki Reaper reviewed over his glucose readings according to last documentation a couple weeks ago he was using 12 units short acting with meals and 24 units long-acting in the evening. Dr Nicki Reaper would recommend going up to 28 units long-acting in the evening he also recommends going up to 14 units with meals. Please have the patient send [place] more readings in a few weeks time and on his glucose log nature the patient writes down how many units he is taking at mealtimes and how many at bedtime so we always have a accurate reflection of what he is doing. Patient confirmed his current insulin usage and verbalized understanding of new dosing instructions.

## 2017-01-15 ENCOUNTER — Encounter: Payer: Self-pay | Admitting: Cardiology

## 2017-01-15 ENCOUNTER — Ambulatory Visit (INDEPENDENT_AMBULATORY_CARE_PROVIDER_SITE_OTHER): Payer: Medicare Other | Admitting: Cardiology

## 2017-01-15 VITALS — BP 106/76 | HR 83 | Ht 70.5 in | Wt 280.0 lb

## 2017-01-15 DIAGNOSIS — E876 Hypokalemia: Secondary | ICD-10-CM | POA: Diagnosis not present

## 2017-01-15 DIAGNOSIS — I10 Essential (primary) hypertension: Secondary | ICD-10-CM

## 2017-01-15 DIAGNOSIS — I350 Nonrheumatic aortic (valve) stenosis: Secondary | ICD-10-CM

## 2017-01-15 DIAGNOSIS — I5022 Chronic systolic (congestive) heart failure: Secondary | ICD-10-CM

## 2017-01-15 DIAGNOSIS — I2510 Atherosclerotic heart disease of native coronary artery without angina pectoris: Secondary | ICD-10-CM

## 2017-01-15 DIAGNOSIS — E782 Mixed hyperlipidemia: Secondary | ICD-10-CM | POA: Diagnosis not present

## 2017-01-15 DIAGNOSIS — I255 Ischemic cardiomyopathy: Secondary | ICD-10-CM | POA: Diagnosis not present

## 2017-01-15 DIAGNOSIS — Z79899 Other long term (current) drug therapy: Secondary | ICD-10-CM | POA: Diagnosis not present

## 2017-01-15 LAB — CBC WITH DIFFERENTIAL/PLATELET
Basophils Absolute: 0 cells/uL (ref 0–200)
Basophils Relative: 0 %
EOS ABS: 213 {cells}/uL (ref 15–500)
Eosinophils Relative: 3 %
HEMATOCRIT: 42.4 % (ref 38.5–50.0)
Hemoglobin: 14.1 g/dL (ref 13.2–17.1)
LYMPHS PCT: 15 %
Lymphs Abs: 1065 cells/uL (ref 850–3900)
MCH: 31.4 pg (ref 27.0–33.0)
MCHC: 33.3 g/dL (ref 32.0–36.0)
MCV: 94.4 fL (ref 80.0–100.0)
MONOS PCT: 12 %
MPV: 10.1 fL (ref 7.5–12.5)
Monocytes Absolute: 852 cells/uL (ref 200–950)
NEUTROS PCT: 70 %
Neutro Abs: 4970 cells/uL (ref 1500–7800)
PLATELETS: 223 10*3/uL (ref 140–400)
RBC: 4.49 MIL/uL (ref 4.20–5.80)
RDW: 16.1 % — AB (ref 11.0–15.0)
WBC: 7.1 10*3/uL (ref 3.8–10.8)

## 2017-01-15 NOTE — Patient Instructions (Signed)
Medication Instructions:  Your physician recommends that you continue on your current medications as directed. Please refer to the Current Medication list given to you today.   Labwork: Your physician recommends that you return for lab work in: TODAY    Testing/Procedures: NONE  Follow-Up: Your physician recommends that you schedule a follow-up appointment in: 3 MONTHS    Any Other Special Instructions Will Be Listed Below (If Applicable).     If you need a refill on your cardiac medications before your next appointment, please call your pharmacy.

## 2017-01-15 NOTE — Progress Notes (Signed)
Clinical Summary [name] [name] is a 66 y.o.male seen today for follow up of the following medical problems.   1. Chronic systolic HF - history of ICM, LVEF 10-15% by echo Jan 2018. Restricitive diastolic dysfunction.  - ICD followed by EP - admit Jan 2018 with acute on chronic CHF. Diuresed nearly 15 lbs. Discharge weight 297 lbs. From pcp note 10/13/16 down to 279 lbs.  - recent weight gain at home after stopping metolazone daily metolazone. Now up to 293 lbs by our scales, 287 by his home scale. Reports abdominal distension, some SOB.  -he reports recent weight gain after increased sodium intake, that is now resolving and now back to his baseline 280 lbs at home. - compliant with meds.    2. CAD History of CABG in 2001(LIMA to distal LAD, SVG to RI, SVG to OM 2, sequential SVG to our PLA, SVG to acute marginal, SVG to posterior descending artery  - denies any chest pain.   3. HTN - he remains compliant with meds  4. Hyperlipidemia - he remains compliant with statin  5. OSA - compliant with CPAP  6. Aortic stenosis - moderate by echo Jan 2018.  - no recent symptoms  7. Hypokalemia - prior issues with hypokalemia, at the time also had ICD fire    Past Medical History:  Diagnosis Date  . AICD (automatic cardioverter/defibrillator) present 05/18/2016  . CAD (coronary artery disease)   . Cardiomyopathy, ischemic   . CHF (congestive heart failure) ([place])   . Chronic back pain   . Diabetes mellitus without complication (HCC)    prediabetes  . Elevated PSA   . Hyperlipidemia   . Morbid obesity ([place])   . OSA (obstructive sleep apnea)    does not wear CPAP, thinks one has been ordered     Allergies  Allergen Reactions  . Dyflex-G [Dyphylline-Guaifenesin] Hives     Current Outpatient Prescriptions  Medication Sig Dispense Refill  . aspirin 325 MG tablet Take 325 mg by mouth daily.    . carvedilol (COREG) 6.25 MG tablet Take 1 tablet (6.25 mg total) by  mouth 2 (two) times daily with a meal. 60 tablet 0  . clopidogrel (PLAVIX) 75 MG tablet Take 1 tablet (75 mg total) by mouth daily. 90 tablet 3  . HYDROcodone-acetaminophen (NORCO) 10-325 MG tablet 1 to 2 tablets as needed for pain, no greater than 2 a day 60 tablet 0  . insulin aspart (NOVOLOG FLEXPEN) 100 UNIT/ML FlexPen Inject 6 units into skin with each meal. May titrate up to 14 units. 15 mL 5  . Insulin Detemir (LEVEMIR) 100 UNIT/ML Pen Inject 12 units into skin each evening. May titrate up to 40 units. 15 mL 5  . Multiple Vitamins-Minerals (CENTRUM SILVER PO) Take 1 tablet by mouth daily.     . nitroGLYCERIN (NITROSTAT) 0.4 MG SL tablet PLACE 1 TABLET UNDER TONGUE FOR CHEST PAIN. MAY REPEAT EVERY 5 MIN UPTO 3 DOSES-NO RELIEF,CALL 911. 25 tablet 6  . polycarbophil (FIBERCON) 625 MG tablet Take 2 tablets (1,250 mg total) by mouth daily with lunch. Hold if you have diarrhea 30 tablet 0  . potassium chloride SA (K-DUR,KLOR-CON) 20 MEQ tablet Take 2 tablets (40 mEq total) by mouth 2 (two) times daily. 360 tablet 3  . rosuvastatin (CRESTOR) 40 MG tablet Take 1 tablet (40 mg total) by mouth daily. 90 tablet 2  . torsemide (DEMADEX) 20 MG tablet Take 2 tablets (40 mg total) by mouth 2 (two)  times daily. 180 tablet 3  . trolamine salicylate (ASPERCREME) 10 % cream Apply 1 application topically as needed for muscle pain.     No current facility-administered medications for this visit.      Past Surgical History:  Procedure Laterality Date  . APPENDECTOMY    . CAROTID STENT    . CATARACT EXTRACTION, BILATERAL    . CORONARY ARTERY BYPASS GRAFT    . EP IMPLANTABLE DEVICE N/A 05/18/2016   Procedure: ICD Implant;  Surgeon: Will Meredith Leeds, MD;  Location: [place] [facility];  Service: Cardiovascular;  Laterality: N/A;  . ingrown toenail N/A   . LEFT HEART CATHETERIZATION WITH CORONARY/GRAFT ANGIOGRAM N/A 12/21/2014   Procedure: LEFT HEART CATHETERIZATION WITH Beatrix Fetters;  Surgeon:  Belva Crome, MD;  Location[facility] CATH LAB;  Service: Cardiovascular;  Laterality: N/A;     Allergies  Allergen Reactions  . Dyflex-G [Dyphylline-Guaifenesin] Hives      Family History  Problem Relation Age of Onset  . Adopted: Yes  . Diabetes Mother   . CAD Mother      Social History [name] [name] reports that he quit smoking about 17 years ago. He has never used smokeless tobacco. [name] [name] reports that he does not drink alcohol.   Review of Systems CONSTITUTIONAL: No weight loss, fever, chills, weakness or fatigue.  HEENT: Eyes: No visual loss, blurred vision, double vision or yellow sclerae.No hearing loss, sneezing, congestion, runny nose or sore throat.  SKIN: No rash or itching.  CARDIOVASCULAR: per HPI RESPIRATORY: No shortness of breath, cough or sputum.  GASTROINTESTINAL: No anorexia, nausea, vomiting or diarrhea. No abdominal pain or blood.  GENITOURINARY: No burning on urination, no polyuria NEUROLOGICAL: No headache, dizziness, syncope, paralysis, ataxia, numbness or tingling in the extremities. No change in bowel or bladder control.  MUSCULOSKELETAL: No muscle, back pain, joint pain or stiffness.  LYMPHATICS: No enlarged nodes. No history of splenectomy.  PSYCHIATRIC: No history of depression or anxiety.  ENDOCRINOLOGIC: No reports of sweating, cold or heat intolerance. No polyuria or polydipsia.  Marland Kitchen   Physical Examination Vitals:   01/15/17 0848  BP: 106/76  Pulse: 83   Vitals:   01/15/17 0848  Weight: 280 lb (127 kg)  Height: 5' 10.5" (1.791 m)    Gen: resting comfortably, no acute distress HEENT: no scleral icterus, pupils equal round and reactive, no palptable cervical adenopathy,  CV: RRR, no m/r/g, no jvd Resp: Clear to auscultation bilaterally GI: abdomen is soft, non-tender, non-distended, normal bowel sounds, no hepatosplenomegaly MSK: extremities are warm, no edema.  Skin: warm, no rash Neuro:  no focal deficits Psych: appropriate  affect   Diagnostic Studies Jan 2018 echo Study Conclusions  - Left ventricle: The cavity size was normal. Wall thickness was normal. The estimated ejection fraction was in the range of 10% to 15%. Diffuse hypokinesis. Doppler parameters are consistent with restrictive physiology, indicative of decreased left ventricular diastolic compliance and/or increased left atrial pressure. Doppler parameters are consistent with high ventricular filling pressure. - Aortic valve: Moderately calcified annulus. Moderately thickened leaflets. There was moderate stenosis. AV gradients decreased in the setting of severe LV systolic dysfunction. AVA VTI and dimensionless index support moderate aortic stenosis. Mean gradient (S): 17 mm Hg. Valve area (VTI): 1.2 cm^2. Valve area (Vmax): 1.1 cm^2. - Left atrium: The atrium was severely dilated. - Pulmonary arteries: Systolic pressure was moderately increased. PA peak pressure: 58 mm Hg (S). - Inferior vena cava: The vessel was dilated. The respirophasic diameter  changes were blunted (<50%), consistent with elevated central venous pressure. - Technically difficult study. Echocontrast was used to enhance visualization.      Assessment and Plan   1. Chronic systolic HF - home weights are at baseline, appears euvolemic. No current symptoms. Medical therapy limited by soft bp's  - continue current meds.    2. CAD - no current symptoms - we will continue to follow clinically  3. HTN -his bp at goal, continue current meds  4. Hyperlipidemia - we will continue statin  5. OSA - continue CPAP  6. Aortic stenosis - moderate by most recent echo. - will need repeat echo likely next year   7. Hypokalemia - repeat BMET/Mg  F/u 3 months     Arnoldo Lenis, M.D.

## 2017-01-16 LAB — BASIC METABOLIC PANEL
BUN: 15 mg/dL (ref 7–25)
CALCIUM: 8.7 mg/dL (ref 8.6–10.3)
CO2: 28 mmol/L (ref 20–31)
CREATININE: 0.92 mg/dL (ref 0.70–1.25)
Chloride: 100 mmol/L (ref 98–110)
Glucose, Bld: 181 mg/dL — ABNORMAL HIGH (ref 65–99)
Potassium: 3.9 mmol/L (ref 3.5–5.3)
Sodium: 140 mmol/L (ref 135–146)

## 2017-01-16 LAB — MAGNESIUM: Magnesium: 2 mg/dL (ref 1.5–2.5)

## 2017-01-26 ENCOUNTER — Telehealth: Payer: Self-pay | Admitting: Family Medicine

## 2017-01-26 NOTE — Telephone Encounter (Signed)
(  MESSAGE for DR.STEVE)-Dr.Teoh office called about which medication patient is on and for how long should he stop and when should he start back. He is having surgery on 6/19. There office send over a fax that has to be faxed back with this information in your folder.

## 2017-01-29 ENCOUNTER — Telehealth: Payer: Self-pay | Admitting: Cardiology

## 2017-01-29 ENCOUNTER — Telehealth: Payer: Self-pay | Admitting: Family Medicine

## 2017-01-29 NOTE — Telephone Encounter (Signed)
Prescribed by cardiology-patient has refills at pharmacy and they stated they will fill it for patient. Patient notified.

## 2017-01-29 NOTE — Telephone Encounter (Signed)
Patient states that he needs his Potassium increased. / tg

## 2017-01-29 NOTE — Telephone Encounter (Signed)
Pt wanted Dr Harl Bowie made aware that he has been taking potassium 2 tablets TID for the last 3 weeks. Labs done 2 weeks ago. Pt aware that we didn't make any changes - pt voiced understanding and says he made the change on his own "to make my potassium right" and that he only wanted to make Dr Harl Bowie aware

## 2017-01-29 NOTE — Telephone Encounter (Signed)
Patient said that he is out of his Potassium and the pharmacy is not going to fill it because it was too early.  He said he has been out all weekend and would like to know what he needs to do.  [employer]

## 2017-01-30 NOTE — Telephone Encounter (Signed)
Dr. Deeann Saint office refaxed.  This request was placed at nurse's station on Dr. Jeannine Kitten side.

## 2017-01-30 NOTE — Telephone Encounter (Signed)
I advise patient to take potassium as prescribed, we won't be changing the prescription at this time   J Mikael Debell MD

## 2017-01-31 NOTE — Telephone Encounter (Signed)
Spoke with heather at Dr Deeann Saint office ad advised Dr Richardson Landry stated patient has complicated heart history including bypass, chf, and cardiomyopathy. rx'ed these meds by his cardiologist, they will have to decide what to hold and whether to utilize bridge therapy. Heather verbalized understanding and will contact patient's cardiologist.

## 2017-01-31 NOTE — Telephone Encounter (Signed)
Call Dr Gordy Councilman, finally got a chance to review, sorry pt has complicated heart history including bypass, chf, and cardiomyopathy. rx'ed these meds by his cardiologist, they will have to decide what to hold and whether to utilize bridge therapy

## 2017-02-05 ENCOUNTER — Telehealth: Payer: Self-pay | Admitting: Family Medicine

## 2017-02-05 NOTE — Telephone Encounter (Signed)
error 

## 2017-02-06 ENCOUNTER — Other Ambulatory Visit: Payer: Self-pay | Admitting: Otolaryngology

## 2017-02-07 NOTE — Progress Notes (Signed)
Due to patient hx severe heart failure, EF 10-15% on ECHO on 09-14-16, pacemaker/ICD, he will be moved to main OR per anesthesia guidelines. Dr Deeann Saint office norified.

## 2017-02-20 ENCOUNTER — Telehealth: Payer: Self-pay | Admitting: Family Medicine

## 2017-02-20 NOTE — Telephone Encounter (Signed)
Review blood glucose log that patient dropped off in red folder.

## 2017-02-20 NOTE — Telephone Encounter (Signed)
His numbers look good we will see him at his July office visit

## 2017-02-26 ENCOUNTER — Ambulatory Visit (INDEPENDENT_AMBULATORY_CARE_PROVIDER_SITE_OTHER): Payer: Medicare Other | Admitting: *Deleted

## 2017-02-26 DIAGNOSIS — I255 Ischemic cardiomyopathy: Secondary | ICD-10-CM | POA: Diagnosis not present

## 2017-02-26 NOTE — Progress Notes (Signed)
Remote ICD transmission.   

## 2017-02-27 ENCOUNTER — Encounter: Payer: Self-pay | Admitting: Cardiology

## 2017-02-27 ENCOUNTER — Encounter: Payer: Self-pay | Admitting: Family Medicine

## 2017-02-27 ENCOUNTER — Ambulatory Visit (INDEPENDENT_AMBULATORY_CARE_PROVIDER_SITE_OTHER): Payer: Medicare Other | Admitting: Family Medicine

## 2017-02-27 VITALS — BP 110/76 | Ht 70.5 in | Wt 282.2 lb

## 2017-02-27 DIAGNOSIS — G8929 Other chronic pain: Secondary | ICD-10-CM

## 2017-02-27 DIAGNOSIS — R972 Elevated prostate specific antigen [PSA]: Secondary | ICD-10-CM

## 2017-02-27 DIAGNOSIS — M545 Low back pain, unspecified: Secondary | ICD-10-CM

## 2017-02-27 DIAGNOSIS — E782 Mixed hyperlipidemia: Secondary | ICD-10-CM

## 2017-02-27 DIAGNOSIS — I10 Essential (primary) hypertension: Secondary | ICD-10-CM

## 2017-02-27 DIAGNOSIS — E119 Type 2 diabetes mellitus without complications: Secondary | ICD-10-CM | POA: Diagnosis not present

## 2017-02-27 DIAGNOSIS — I255 Ischemic cardiomyopathy: Secondary | ICD-10-CM | POA: Diagnosis not present

## 2017-02-27 LAB — CUP PACEART REMOTE DEVICE CHECK
Battery Remaining Longevity: 134 mo
Battery Voltage: 3.06 V
Brady Statistic RV Percent Paced: 0.01 %
Date Time Interrogation Session: 20180702052403
HIGH POWER IMPEDANCE MEASURED VALUE: 88 Ohm
Lead Channel Impedance Value: 380 Ohm
Lead Channel Impedance Value: 456 Ohm
Lead Channel Pacing Threshold Pulse Width: 0.4 ms
Lead Channel Sensing Intrinsic Amplitude: 5.75 mV
Lead Channel Setting Sensing Sensitivity: 0.3 mV
MDC IDC LEAD IMPLANT DT: 20170921
MDC IDC LEAD LOCATION: 753860
MDC IDC MSMT LEADCHNL RV PACING THRESHOLD AMPLITUDE: 0.875 V
MDC IDC MSMT LEADCHNL RV SENSING INTR AMPL: 5.75 mV
MDC IDC PG IMPLANT DT: 20170921
MDC IDC SET LEADCHNL RV PACING AMPLITUDE: 2.5 V
MDC IDC SET LEADCHNL RV PACING PULSEWIDTH: 0.4 ms

## 2017-02-27 MED ORDER — HYDROCODONE-ACETAMINOPHEN 10-325 MG PO TABS: ORAL_TABLET | ORAL | 0 refills | Status: DC

## 2017-02-27 MED ORDER — INSULIN DETEMIR 100 UNIT/ML FLEXPEN: PEN_INJECTOR | SUBCUTANEOUS | 5 refills | Status: DC

## 2017-02-27 NOTE — Progress Notes (Signed)
   Subjective:    Patient ID: [name] [name], male    DOB: 1951/04/07, 66 y.o.   MRN: [id number]  HPI This patient was seen today for chronic pain  The medication list was reviewed and updated.   -Compliance with medication: Patient takes once per week.  - Number patient states they take daily: Patient takes 2 tablets daily   -when was the last dose patient took? Last taken a two weeks ago  The patient was advised the importance of maintaining medication and not using illegal substances with these.  Refills needed: yes The patient was educated that we can provide 3 monthly scripts for their medication, it is their responsibility to follow the instructions.  Side effects or complications from medications: Constipation  Patient is aware that pain medications are meant to minimize the severity of the pain to allow their pain levels to improve to allow for better function. They are aware of that pain medications cannot totally remove their pain.  Due for UDT ( at least once per year) : N/A  Patient has concerns of blood sugars and insulin.      Review of Systems  Constitutional: Negative for activity change, appetite change and fatigue.  HENT: Negative for congestion.   Respiratory: Negative for cough.   Cardiovascular: Negative for chest pain.  Gastrointestinal: Negative for abdominal pain.  Endocrine: Negative for polydipsia and polyphagia.  Musculoskeletal: Positive for arthralgias and back pain.  Neurological: Negative for weakness.  Psychiatric/Behavioral: Negative for confusion.       Objective:   Physical Exam  Constitutional: He appears well-nourished. No distress.  Cardiovascular: Normal rate, regular rhythm and normal heart sounds.   No murmur heard. Pulmonary/Chest: Effort normal and breath sounds normal. No respiratory distress.  Musculoskeletal: He exhibits no edema.  Lymphadenopathy:    He has no cervical adenopathy.  Neurological: He is alert.    Psychiatric: His behavior is normal.  Vitals reviewed.         Assessment & Plan:  HTN continue current measures watch diet closely no swelling noted on today's exam CHF under good control  Diabetes adjustment long-acting insulin to get the morning sugars between 100 and 130 check lab work await the results previous labs reviewed  Hyperlipidemia continue current measures watch diet closely previous labs review new labs ordered  Chronic low back pain 3 prescriptions her pain medicine were given without difficulty drug registry was checked continue current measures pain medicine does help the patient function  Morbid obesity patient was encouraged to stay active watch diet try to lose weight  Elevated PSA is followed by urology on a regular basis to continue doing so  Follow-up here 3 months

## 2017-02-28 LAB — LIPID PANEL
CHOL/HDL RATIO: 4 ratio (ref 0.0–5.0)
Cholesterol, Total: 137 mg/dL (ref 100–199)
HDL: 34 mg/dL — ABNORMAL LOW (ref >39)
LDL Calculated: 59 mg/dL (ref 0–99)
Triglycerides: 222 mg/dL — ABNORMAL HIGH (ref 0–149)
VLDL Cholesterol Cal: 44 mg/dL — ABNORMAL HIGH (ref 5–40)

## 2017-02-28 LAB — HEPATIC FUNCTION PANEL
ALT: 15 IU/L (ref 0–44)
AST: 20 IU/L (ref 0–40)
Albumin: 4.5 g/dL (ref 3.6–4.8)
Alkaline Phosphatase: 87 IU/L (ref 39–117)
BILIRUBIN TOTAL: 0.7 mg/dL (ref 0.0–1.2)
BILIRUBIN, DIRECT: 0.16 mg/dL (ref 0.00–0.40)
TOTAL PROTEIN: 7.9 g/dL (ref 6.0–8.5)

## 2017-02-28 LAB — BASIC METABOLIC PANEL
BUN/Creatinine Ratio: 13 (ref 10–24)
BUN: 14 mg/dL (ref 8–27)
CHLORIDE: 98 mmol/L (ref 96–106)
CO2: 28 mmol/L (ref 20–29)
CREATININE: 1.06 mg/dL (ref 0.76–1.27)
Calcium: 9.7 mg/dL (ref 8.6–10.2)
GFR calc Af Amer: 84 mL/min/{1.73_m2} (ref >59)
GFR calc non Af Amer: 73 mL/min/{1.73_m2} (ref >59)
Glucose: 89 mg/dL (ref 65–99)
POTASSIUM: 4.2 mmol/L (ref 3.5–5.2)
SODIUM: 142 mmol/L (ref 134–144)

## 2017-02-28 LAB — HEMOGLOBIN A1C
Est. average glucose Bld gHb Est-mCnc: 157 mg/dL
Hgb A1c MFr Bld: 7.1 % — ABNORMAL HIGH (ref 4.8–5.6)

## 2017-02-28 LAB — MICROALBUMIN / CREATININE URINE RATIO
Creatinine, Urine: 39.2 mg/dL
MICROALB/CREAT RATIO: 14.5 mg/g{creat} (ref 0.0–30.0)
MICROALBUM., U, RANDOM: 5.7 ug/mL

## 2017-03-13 ENCOUNTER — Encounter: Payer: Self-pay | Admitting: Cardiology

## 2017-04-06 ENCOUNTER — Other Ambulatory Visit: Payer: Self-pay | Admitting: Cardiology

## 2017-04-18 ENCOUNTER — Telehealth: Payer: Self-pay | Admitting: Cardiology

## 2017-04-18 NOTE — Telephone Encounter (Signed)
Please give pt a call--has question about upcoming apt on Monday w/ Dr. Harl Bowie

## 2017-04-18 NOTE — Telephone Encounter (Signed)
Pt wondered if he needed fu apt next week.has planned nasal surgery, I encouraged him to come,states he will

## 2017-04-23 ENCOUNTER — Encounter (HOSPITAL_COMMUNITY)
Admission: RE | Admit: 2017-04-23 | Discharge: 2017-04-23 | Disposition: A | Discharge status: Home / Self Care | Payer: Medicare Other | Source: Ambulatory Visit | Attending: Otolaryngology | Admitting: Otolaryngology

## 2017-04-23 ENCOUNTER — Ambulatory Visit (INDEPENDENT_AMBULATORY_CARE_PROVIDER_SITE_OTHER): Payer: Medicare Other | Admitting: Cardiology

## 2017-04-23 ENCOUNTER — Encounter: Payer: Self-pay | Admitting: Cardiology

## 2017-04-23 ENCOUNTER — Encounter (HOSPITAL_COMMUNITY): Payer: Self-pay

## 2017-04-23 VITALS — BP 110/78 | HR 78 | Ht 70.5 in | Wt 287.0 lb

## 2017-04-23 DIAGNOSIS — Z01810 Encounter for preprocedural cardiovascular examination: Secondary | ICD-10-CM

## 2017-04-23 DIAGNOSIS — Z79891 Long term (current) use of opiate analgesic: Secondary | ICD-10-CM | POA: Diagnosis not present

## 2017-04-23 DIAGNOSIS — I350 Nonrheumatic aortic (valve) stenosis: Secondary | ICD-10-CM

## 2017-04-23 DIAGNOSIS — E785 Hyperlipidemia, unspecified: Secondary | ICD-10-CM

## 2017-04-23 DIAGNOSIS — E782 Mixed hyperlipidemia: Secondary | ICD-10-CM | POA: Diagnosis not present

## 2017-04-23 DIAGNOSIS — Z87891 Personal history of nicotine dependence: Secondary | ICD-10-CM

## 2017-04-23 DIAGNOSIS — J343 Hypertrophy of nasal turbinates: Secondary | ICD-10-CM

## 2017-04-23 DIAGNOSIS — E119 Type 2 diabetes mellitus without complications: Secondary | ICD-10-CM

## 2017-04-23 DIAGNOSIS — I10 Essential (primary) hypertension: Secondary | ICD-10-CM | POA: Diagnosis not present

## 2017-04-23 DIAGNOSIS — Z7902 Long term (current) use of antithrombotics/antiplatelets: Secondary | ICD-10-CM | POA: Insufficient documentation

## 2017-04-23 DIAGNOSIS — I2510 Atherosclerotic heart disease of native coronary artery without angina pectoris: Secondary | ICD-10-CM | POA: Insufficient documentation

## 2017-04-23 DIAGNOSIS — I252 Old myocardial infarction: Secondary | ICD-10-CM | POA: Diagnosis not present

## 2017-04-23 DIAGNOSIS — Z7982 Long term (current) use of aspirin: Secondary | ICD-10-CM | POA: Insufficient documentation

## 2017-04-23 DIAGNOSIS — J3489 Other specified disorders of nose and nasal sinuses: Secondary | ICD-10-CM | POA: Diagnosis not present

## 2017-04-23 DIAGNOSIS — Z01818 Encounter for other preprocedural examination: Secondary | ICD-10-CM

## 2017-04-23 DIAGNOSIS — J342 Deviated nasal septum: Secondary | ICD-10-CM

## 2017-04-23 DIAGNOSIS — G4733 Obstructive sleep apnea (adult) (pediatric): Secondary | ICD-10-CM | POA: Insufficient documentation

## 2017-04-23 DIAGNOSIS — Z01812 Encounter for preprocedural laboratory examination: Secondary | ICD-10-CM

## 2017-04-23 DIAGNOSIS — I5022 Chronic systolic (congestive) heart failure: Secondary | ICD-10-CM

## 2017-04-23 DIAGNOSIS — Z794 Long term (current) use of insulin: Secondary | ICD-10-CM

## 2017-04-23 DIAGNOSIS — Z79899 Other long term (current) drug therapy: Secondary | ICD-10-CM | POA: Insufficient documentation

## 2017-04-23 DIAGNOSIS — J310 Chronic rhinitis: Secondary | ICD-10-CM | POA: Diagnosis not present

## 2017-04-23 DIAGNOSIS — I509 Heart failure, unspecified: Secondary | ICD-10-CM | POA: Diagnosis not present

## 2017-04-23 DIAGNOSIS — Z95810 Presence of automatic (implantable) cardiac defibrillator: Secondary | ICD-10-CM

## 2017-04-23 DIAGNOSIS — I255 Ischemic cardiomyopathy: Secondary | ICD-10-CM

## 2017-04-23 HISTORY — DX: Personal history of urinary calculi: Z87.442

## 2017-04-23 HISTORY — DX: Unspecified osteoarthritis, unspecified site: M19.90

## 2017-04-23 HISTORY — DX: Acute myocardial infarction, unspecified: I21.9

## 2017-04-23 HISTORY — DX: Angina pectoris, unspecified: I20.9

## 2017-04-23 LAB — BASIC METABOLIC PANEL
ANION GAP: 8 (ref 5–15)
BUN: 14 mg/dL (ref 6–20)
CO2: 28 mmol/L (ref 22–32)
Calcium: 8.9 mg/dL (ref 8.9–10.3)
Chloride: 106 mmol/L (ref 101–111)
Creatinine, Ser: 1.03 mg/dL (ref 0.61–1.24)
GFR calc Af Amer: >60 mL/min (ref >60)
GFR calc non Af Amer: >60 mL/min (ref >60)
GLUCOSE: 69 mg/dL (ref 65–99)
POTASSIUM: 3.7 mmol/L (ref 3.5–5.1)
Sodium: 142 mmol/L (ref 135–145)

## 2017-04-23 LAB — HEMOGLOBIN A1C
Hgb A1c MFr Bld: 6.5 % — ABNORMAL HIGH (ref 4.8–5.6)
Mean Plasma Glucose: 139.85 mg/dL

## 2017-04-23 LAB — CBC
HEMATOCRIT: 43.8 % (ref 39.0–52.0)
HEMOGLOBIN: 14.4 g/dL (ref 13.0–17.0)
MCH: 30.6 pg (ref 26.0–34.0)
MCHC: 32.9 g/dL (ref 30.0–36.0)
MCV: 93.2 fL (ref 78.0–100.0)
Platelets: 269 10*3/uL (ref 150–400)
RBC: 4.7 MIL/uL (ref 4.22–5.81)
RDW: 14.5 % (ref 11.5–15.5)
WBC: 9.1 10*3/uL (ref 4.0–10.5)

## 2017-04-23 LAB — GLUCOSE, CAPILLARY: GLUCOSE-CAPILLARY: 75 mg/dL (ref 65–99)

## 2017-04-23 NOTE — Progress Notes (Signed)
Clinical Summary Mr. Gersten is a 66 y.o.male seen today for follow up of the following medical problems.   1. Chronic systolic HF - history of ICM, LVEF 10-15% by echo Jan 2018. Restricitive diastolic dysfunction.  - ICD followed by EP - admit Jan 2018 with acute on chronic CHF. Diuresed nearly 15 lbs. Discharge weight 297 lbs. From pcp note 10/13/16 down to 279 lbs.  - recent weight gain at home after stopping metolazone daily metolazone. Now up to 293 lbs by our scales, 287 by his home scale. Reports abdominal distension, some SOB.    - no recent issues with swelling. Home weights stable around 280 lbs. Taking oresmide 40mg  bid - compliant with meds.  - works on a cardiac cross trainer for 15 minutes 5-6 times per week, tolerates well without troubles.    2. CAD History of CABG in 2001(LIMA to distal LAD, SVG to RI, SVG to OM 2, sequential SVG to our PLA, SVG to acute marginal, SVG to posterior descending artery  - no recent chest pain.   3. HTN -compliant with meds  4. Hyperlipidemia - compliant with statin  5. OSA - compliant with CPAP  6. Aortic stenosis - moderate by echo Jan 2018.  - no new symptoms since last visit  7. Nasal surgery - followed ENT, plans for nasal septoplasty    Past Medical History:  Diagnosis Date  . AICD (automatic cardioverter/defibrillator) present 05/18/2016  . CAD (coronary artery disease)   . Cardiomyopathy, ischemic   . CHF (congestive heart failure) ([place])   . Chronic back pain   . Diabetes mellitus without complication (HCC)    prediabetes  . Elevated PSA   . Hyperlipidemia   . Morbid obesity ([place])   . OSA (obstructive sleep apnea)    does not wear CPAP, thinks one has been ordered     Allergies  Allergen Reactions  . Dyflex-G [Dyphylline-Guaifenesin] Hives     Current Outpatient Prescriptions  Medication Sig Dispense Refill  . aspirin 325 MG tablet Take 325 mg by mouth daily.    . carvedilol (COREG)  6.25 MG tablet Take 1 tablet (6.25 mg total) by mouth 2 (two) times daily with a meal. 60 tablet 0  . clopidogrel (PLAVIX) 75 MG tablet Take 1 tablet (75 mg total) by mouth daily. 90 tablet 3  . HYDROcodone-acetaminophen (NORCO) 10-325 MG tablet 1 to 2 tablets as needed for pain, no greater than 2 a day 60 tablet 0  . insulin aspart (NOVOLOG FLEXPEN) 100 UNIT/ML FlexPen Inject 6 units into skin with each meal. May titrate up to 14 units. 15 mL 5  . Insulin Detemir (LEVEMIR) 100 UNIT/ML Pen Inject 12 units into skin each evening. May titrate up to 60 units. 15 mL 5  . Multiple Vitamins-Minerals (CENTRUM SILVER PO) Take 1 tablet by mouth daily.     . nitroGLYCERIN (NITROSTAT) 0.4 MG SL tablet PLACE 1 TABLET UNDER TONGUE FOR CHEST PAIN. MAY REPEAT EVERY 5 MIN UPTO 3 DOSES-NO RELIEF,CALL 911. (Patient not taking: Reported on 02/27/2017) 25 tablet 6  . polycarbophil (FIBERCON) 625 MG tablet Take 2 tablets (1,250 mg total) by mouth daily with lunch. Hold if you have diarrhea (Patient taking differently: Take 1,250 mg by mouth 3 (three) times daily. Hold if you have diarrhea) 30 tablet 0  . potassium chloride SA (K-DUR,KLOR-CON) 20 MEQ tablet Take 2 tablets (40 mEq total) by mouth 2 (two) times daily. (Patient taking differently: Take 40 mEq by mouth  3 (three) times daily. ) 360 tablet 3  . rosuvastatin (CRESTOR) 40 MG tablet Take 1 tablet (40 mg total) by mouth daily. 90 tablet 2  . torsemide (DEMADEX) 20 MG tablet TAKE (2) TABLETS BY MOUTH TWICE DAILY. 180 tablet 0  . trolamine salicylate (ASPERCREME) 10 % cream Apply 1 application topically as needed for muscle pain.     No current facility-administered medications for this visit.      Past Surgical History:  Procedure Laterality Date  . APPENDECTOMY    . CAROTID STENT    . CATARACT EXTRACTION, BILATERAL    . CORONARY ARTERY BYPASS GRAFT    . EP IMPLANTABLE DEVICE N/A 05/18/2016   Procedure: ICD Implant;  Surgeon: Will Meredith Leeds, MD;  Location:  [place] [facility];  Service: Cardiovascular;  Laterality: N/A;  . ingrown toenail N/A   . LEFT HEART CATHETERIZATION WITH CORONARY/GRAFT ANGIOGRAM N/A 12/21/2014   Procedure: LEFT HEART CATHETERIZATION WITH Beatrix Fetters;  Surgeon: Belva Crome, MD;  Location: [facility] CATH LAB;  Service: Cardiovascular;  Laterality: N/A;     Allergies  Allergen Reactions  . Dyflex-G [Dyphylline-Guaifenesin] Hives      Family History  Problem Relation Age of Onset  . Adopted: Yes  . Diabetes Mother   . CAD Mother      Social History Mr. Tassinari reports that he quit smoking about 17 years ago. He has never used smokeless tobacco. Mr. Seidman reports that he does not drink alcohol.   Review of Systems CONSTITUTIONAL: No weight loss, fever, chills, weakness or fatigue.  HEENT: Eyes: No visual loss, blurred vision, double vision or yellow sclerae.No hearing loss, sneezing, congestion, runny nose or sore throat.  SKIN: No rash or itching.  CARDIOVASCULAR: per hpi RESPIRATORY: No shortness of breath, cough or sputum.  GASTROINTESTINAL: No anorexia, nausea, vomiting or diarrhea. No abdominal pain or blood.  GENITOURINARY: No burning on urination, no polyuria NEUROLOGICAL: No headache, dizziness, syncope, paralysis, ataxia, numbness or tingling in the extremities. No change in bowel or bladder control.  MUSCULOSKELETAL: No muscle, back pain, joint pain or stiffness.  LYMPHATICS: No enlarged nodes. No history of splenectomy.  PSYCHIATRIC: No history of depression or anxiety.  ENDOCRINOLOGIC: No reports of sweating, cold or heat intolerance. No polyuria or polydipsia.  Marland Kitchen   Physical Examination Vitals:   04/23/17 0857  BP: 110/78  Pulse: 78  SpO2: 98%   Vitals:   04/23/17 0857  Weight: 287 lb (130.2 kg)  Height: 5' 10.5" (1.791 m)    Gen: resting comfortably, no acute distress HEENT: no scleral icterus, pupils equal round and reactive, no palptable cervical adenopathy,  CV: RRR, no  m/r/g, no jvd Resp: Clear to auscultation bilaterally GI: abdomen is soft, non-tender, non-distended, normal bowel sounds, no hepatosplenomegaly MSK: extremities are warm, no edema.  Skin: warm, no rash Neuro:  no focal deficits Psych: appropriate affect   Diagnostic Studies Jan 2018 echo Study Conclusions  - Left ventricle: The cavity size was normal. Wall thickness was normal. The estimated ejection fraction was in the range of 10% to 15%. Diffuse hypokinesis. Doppler parameters are consistent with restrictive physiology, indicative of decreased left ventricular diastolic compliance and/or increased left atrial pressure. Doppler parameters are consistent with high ventricular filling pressure. - Aortic valve: Moderately calcified annulus. Moderately thickened leaflets. There was moderate stenosis. AV gradients decreased in the setting of severe LV systolic dysfunction. AVA VTI and dimensionless index support moderate aortic stenosis. Mean gradient (S): 17 mm Hg. Valve area (  VTI): 1.2 cm^2. Valve area (Vmax): 1.1 cm^2. - Left atrium: The atrium was severely dilated. - Pulmonary arteries: Systolic pressure was moderately increased. PA peak pressure: 58 mm Hg (S). - Inferior vena cava: The vessel was dilated. The respirophasic diameter changes were blunted (<50%), consistent with elevated central venous pressure. - Technically difficult study. Echocontrast was used to enhance visualization.    Assessment and Plan  1. Chronic systolic HF - appears euvolemic. No recent symptoms Medical therapy limited by soft bp's  - he will continue current meds.    2. CAD - denies any symptoms - continue to follow clinically  3. HTN -bp is at goal, continue current meds  4. Hyperlipidemia - continue statin  5. OSA - continue CPAP  6. Aortic stenosis - moderate by most recent echo. - will need repeat echo likely next  year   7.Preoperative evaluation - patient being considered for ENT surgery. He has no active acute cardiac conditions. His chronic cardiac conditions including chronic systolic HF and CAD are currently asymptomatic and stable. He tolerates greater than 4 METs regularly without limitation. Recommend proceeding with surgery as planned, may hold antiplatelet agents as need   F/u 4 months      Arnoldo Lenis, M.D., F.A.C.C.

## 2017-04-23 NOTE — Patient Instructions (Signed)
Medication Instructions:  Your physician recommends that you continue on your current medications as directed. Please refer to the Current Medication list given to you today.   Labwork: NONE  Testing/Procedures: NONE  Follow-Up: Your physician recommends that you schedule a follow-up appointment in: 4 MONTHS    Any Other Special Instructions Will Be Listed Below (If Applicable).     If you need a refill on your cardiac medications before your next appointment, please call your pharmacy.   

## 2017-04-23 NOTE — Pre-Procedure Instructions (Addendum)
Reynol Arnone Adcox  04/23/2017      [facility] - [place], [address] [place] [address] Phone: [phone] Fax: [phone]    Your procedure is scheduled on 04/25/17.  Report to [facility] Admitting at 630 A.M.  Call this number if you have problems the morning of surgery:  [phone]   Remember:  Do not eat food or drink liquids after midnight.  Take these medicines the morning of surgery with A SIP OF WATER      Carvedilol(coreg),hydrocodone if needed, nitro if needed  Stop plavix per dr  Bridgette Habermann all herbel meds, nsaids (aleve,naproxen,advil,ibuprofen)  prior to surgery starting today including all vitamins/supplements,aspercreme   How to Manage Your Diabetes Before and After Surgery  Why is it important to control my blood sugar before and after surgery? . Improving blood sugar levels before and after surgery helps healing and can limit problems. . A way of improving blood sugar control is eating a healthy diet by: o  Eating less sugar and carbohydrates o  Increasing activity/exercise o  Talking with your doctor about reaching your blood sugar goals . High blood sugars (greater than 180 mg/dL) can raise your risk of infections and slow your recovery, so you will need to focus on controlling your diabetes during the weeks before surgery. . Make sure that the doctor who takes care of your diabetes knows about your planned surgery including the date and location.  How do I manage my blood sugar before surgery? . Check your blood sugar at least 4 times a day, starting 2 days before surgery, to make sure that the level is not too high or low. o Check your blood sugar the morning of your surgery when you wake up and every 2 hours until you get to the Short Stay unit. . If your blood sugar is less than 70 mg/dL, you will need to treat for low blood sugar: o Do not take insulin. o Treat a low blood sugar (less than 70 mg/dL)  with  cup of clear juice (cranberry or apple), 4 glucose tablets, OR glucose gel. o Recheck blood sugar in 15 minutes after treatment (to make sure it is greater than 70 mg/dL). If your blood sugar is not greater than 70 mg/dL on recheck, call [phone] for further instructions. . Report your blood sugar to the short stay nurse when you get to Short Stay.  . If you are admitted to the hospital after surgery: o Your blood sugar will be checked by the staff and you will probably be given insulin after surgery (instead of oral diabetes medicines) to make sure you have good blood sugar levels. o The goal for blood sugar control after surgery is 80-180 mg/dL.     WHAT DO I DO ABOUT MY DIABETES MEDICATION?   . THE NIGHT BEFORE SURGERY, take Novolog  Usual dose with meals-       no pm dose .    Marland Kitchen Levimir take 30 units    night before      THE MORNING OF SURGERY,do not take any insulin except as instructed below    take   . If your CBG is greater than 220 mg/dL, you may take  of your sliding scale (novolog-correction) dose of insulin.   Do not wear jewelry, make-up or nail polish.  Do not wear lotions, powders, or perfumes, or deoderant.  Do not shave 48 hours prior to surgery.  Men may shave face and neck.  Do not bring valuables to the hospital.  [facility] is not responsible for any belongings or valuables.  Contacts, dentures or bridgework may not be worn into surgery.  Leave your suitcase in the car.  After surgery it may be brought to your room.  For patients admitted to the hospital, discharge time will be determined by your treatment team.  Patients discharged the day of surgery will not be allowed to drive home.   Special instructions  Special Instructions: [facility] - Preparing for Surgery  Before surgery, you can play an important role.  Because skin is not sterile, your skin needs to be as free of germs as possible.  You can reduce the number of germs on you skin by  washing with CHG (chlorahexidine gluconate) soap before surgery.  CHG is an antiseptic cleaner which kills germs and bonds with the skin to continue killing germs even after washing.  Please DO NOT use if you have an allergy to CHG or antibacterial soaps.  If your skin becomes reddened/irritated stop using the CHG and inform your nurse when you arrive at Short Stay.  Do not shave (including legs and underarms) for at least 48 hours prior to the first CHG shower.  You may shave your face.  Please follow these instructions carefully:   1.  Shower with CHG Soap the night before surgery and the morning of Surgery.  2.  If you choose to wash your hair, wash your hair first as usual with your normal shampoo.  3.  After you shampoo, rinse your hair and body thoroughly to remove the Shampoo.  4.  Use CHG as you would any other liquid soap.  You can apply chg directly  to the skin and wash gently with scrungie or a clean washcloth.  5.  Apply the CHG Soap to your body ONLY FROM THE NECK DOWN.  Do not use on open wounds or open sores.  Avoid contact with your eyes ears, mouth and genitals (private parts).  Wash genitals (private parts)       with your normal soap.  6.  Wash thoroughly, paying special attention to the area where your surgery will be performed.  7.  Thoroughly rinse your body with warm water from the neck down.  8.  DO NOT shower/wash with your normal soap after using and rinsing off the CHG Soap.  9.  Pat yourself dry with a clean towel.            10.  Wear clean pajamas.            11.  Place clean sheets on your bed the night of your first shower and do not sleep with pets.  Day of Surgery  Do not apply any lotions/deodorants the morning of surgery.  Please wear clean clothes to the hospital/surgery center.  Please read over the  fact sheets that you were given.

## 2017-04-24 ENCOUNTER — Telehealth: Payer: Self-pay | Admitting: Adult Health

## 2017-04-24 MED ORDER — CARVEDILOL 6.25 MG PO TABS: 6.2500 mg | ORAL_TABLET | Freq: Two times a day (BID) | ORAL | 6 refills | Status: DC

## 2017-04-24 NOTE — Anesthesia Preprocedure Evaluation (Addendum)
Anesthesia Evaluation  Patient identified by MRN, date of birth, ID band Patient awake    Reviewed: Allergy & Precautions, H&P , Patient's Chart, lab work & pertinent test results, reviewed documented beta blocker date and time   Airway Mallampati: IV  TM Distance: >3 FB Neck ROM: full    Dental no notable dental hx.    Pulmonary former smoker,    Pulmonary exam normal breath sounds clear to auscultation       Cardiovascular  Rhythm:regular Rate:Normal     Neuro/Psych    GI/Hepatic   Endo/Other  diabetes  Renal/GU      Musculoskeletal   Abdominal   Peds  Hematology   Anesthesia Other Findings CAD (coronary artery disease)   Hyperlipidemia   Elevated PSA   Diabetes mellitus without complication (HCC)  prediabetes CHF (congestive heart failure) (HCC)   Chronic back pain   Morbid obesity  Cardiomyopathy, ischemic 10-15% EF  AICD  present 05/18/2016   Myocardial infarction   Anginal pain OSA (obstructive sleep apnea)      Reproductive/Obstetrics                            Anesthesia Physical Anesthesia Plan  ASA: III  Anesthesia Plan: General   Post-op Pain Management:    Induction: Intravenous  PONV Risk Score and Plan: 2 and Ondansetron, Dexamethasone and Treatment may vary due to age or medical condition  Airway Management Planned: Oral ETT  Additional Equipment: Arterial line  Intra-op Plan:   Post-operative Plan: Extubation in OR  Informed Consent: I have reviewed the patients History and Physical, chart, labs and discussed the procedure including the risks, benefits and alternatives for the proposed anesthesia with the patient or authorized representative who has indicated his/her understanding and acceptance.   Dental Advisory Given  Plan Discussed with: CRNA and Surgeon  Anesthesia Plan Comments: (  )        Anesthesia Quick Evaluation

## 2017-04-24 NOTE — Telephone Encounter (Signed)
Patient has questions about his Carvedilol / tg

## 2017-04-24 NOTE — Progress Notes (Signed)
Anesthesia chart review:  Patient is a 66 year old male scheduled for nasal septoplasty with bilateral turbinate reduction on 04/25/2017 with Leta Baptist, MD  - PCP is Sallee Lange, MD, last office visit 02/27/17 - Cardiologist is Carlyle Dolly, MD, last office visit 04/23/17  PMH includes: CAD (s/p CABG 0175), chronic systolic CHF, ischemic cardiopathy, AICD (medtronic, implanted 05/18/16; delivered shock 11/07/16 thought due to hypokalemia), aortic stenosis (moderate by 08/2016 echo), OSA, DM, hyperlipidemia. Former smoker. BMI 41.  - Hospitalized 1/5-22/18 for acute on chronic CHF (gross anasarca on admission).  Complicated by AKI, kypokalemia  Medications include: ASA 325 mg, Plavix, NovoLog, Levemir, metolazone, potassium, rosuvastatin, torsemide - Pt misunderstood pre-op instructions from surgeon's office; he stopped carvedilol, torsemide, potassium 04/17/17.  PAT RN instructed pt to restart these. He also stopped ASA and plavix 04/17/17.    BP 117/70   Pulse 88   Temp 37.1 C   Resp 20   Ht 5' 10.5" (1.791 m)   Wt 288 lb 4.8 oz (130.8 kg)   SpO2 98%   BMI 40.78 kg/m   Preoperative labs reviewed.  HbA1c 6.5, glucose 69  CXR 09/11/16: 1. Cannot exclude airspace opacification over the posterior mid to lower lungs on the lateral film which may be due to vascular congestion or infection. Small amount of bilateral pleural fluid posteriorly. 2. Stable cardiomegaly.  EKG 09/17/16:  - Sinus rhythm with occasional PVCs. Right superior axis deviation. Low voltage QRS. Cannot rule out Anterior infarct, age undetermined  Echo 09/12/16:  - Left ventricle: The cavity size was normal. Wall thickness was normal. The estimated ejection fraction was in the range of 10% to 15%. Diffuse hypokinesis. Doppler parameters are consistent with restrictive physiology, indicative of decreased left ventricular diastolic compliance and/or increased left atrial pressure. Doppler parameters are consistent with high  ventricular filling pressure. - Aortic valve: Moderately calcified annulus. Moderately thickened leaflets. There was moderate stenosis. AV gradients decreased in the setting of severe LV systolic dysfunction. AVA VTI and dimensionless index support moderate aortic stenosis. Mean gradient (S): 17 mm Hg. Valve area (VTI): 1.2 cm^2. Valve area (Vmax): 1.1 cm^2. - Left atrium: The atrium was severely dilated. - Pulmonary arteries: Systolic pressure was moderately increased. PA peak pressure: 58 mm Hg (S). - Inferior vena cava: The vessel was dilated. The respirophasic diameter changes were blunted (< 50%), consistent with elevated central venous pressure. - Technically difficult study. Echocontrast was used to enhance visualization.  Perioperative prescription for ICD states procedure may interfere with device function. Magnet should be placed over device during procedure. Postop interrogation is not needed.  If no changes, I anticipate pt can proceed with surgery as scheduled.   Willeen Cass, FNP-BC [facility]/Anesthesiology Phone: [phone] 04/24/2017 10:54 AM

## 2017-04-24 NOTE — Telephone Encounter (Signed)
Pt needed refill of coreg sent in to pharmacy.

## 2017-04-25 ENCOUNTER — Ambulatory Visit (HOSPITAL_BASED_OUTPATIENT_CLINIC_OR_DEPARTMENT_OTHER)
Admission: RE | Admit: 2017-04-25 | Discharge: 2017-04-26 | Disposition: A | Discharge status: Home / Self Care | Payer: Medicare Other | Source: Ambulatory Visit | Attending: Otolaryngology | Admitting: Otolaryngology

## 2017-04-25 ENCOUNTER — Ambulatory Visit (HOSPITAL_COMMUNITY): Payer: Medicare Other | Admitting: Emergency Medicine

## 2017-04-25 ENCOUNTER — Encounter (HOSPITAL_COMMUNITY): Payer: Self-pay | Admitting: Anesthesiology

## 2017-04-25 ENCOUNTER — Ambulatory Visit (HOSPITAL_COMMUNITY): Payer: Medicare Other | Admitting: Certified Registered Nurse Anesthetist

## 2017-04-25 ENCOUNTER — Encounter (HOSPITAL_COMMUNITY)
Admission: RE | Disposition: A | Discharge status: Home / Self Care | Payer: Self-pay | Source: Ambulatory Visit | Attending: Otolaryngology

## 2017-04-25 DIAGNOSIS — Z79891 Long term (current) use of opiate analgesic: Secondary | ICD-10-CM | POA: Diagnosis not present

## 2017-04-25 DIAGNOSIS — I509 Heart failure, unspecified: Secondary | ICD-10-CM | POA: Insufficient documentation

## 2017-04-25 DIAGNOSIS — I252 Old myocardial infarction: Secondary | ICD-10-CM | POA: Insufficient documentation

## 2017-04-25 DIAGNOSIS — E785 Hyperlipidemia, unspecified: Secondary | ICD-10-CM | POA: Insufficient documentation

## 2017-04-25 DIAGNOSIS — I2510 Atherosclerotic heart disease of native coronary artery without angina pectoris: Secondary | ICD-10-CM | POA: Insufficient documentation

## 2017-04-25 DIAGNOSIS — J343 Hypertrophy of nasal turbinates: Secondary | ICD-10-CM | POA: Diagnosis not present

## 2017-04-25 DIAGNOSIS — Z79899 Other long term (current) drug therapy: Secondary | ICD-10-CM | POA: Diagnosis not present

## 2017-04-25 DIAGNOSIS — J342 Deviated nasal septum: Secondary | ICD-10-CM | POA: Diagnosis not present

## 2017-04-25 DIAGNOSIS — Z794 Long term (current) use of insulin: Secondary | ICD-10-CM | POA: Insufficient documentation

## 2017-04-25 DIAGNOSIS — J310 Chronic rhinitis: Secondary | ICD-10-CM | POA: Diagnosis not present

## 2017-04-25 DIAGNOSIS — I255 Ischemic cardiomyopathy: Secondary | ICD-10-CM | POA: Diagnosis not present

## 2017-04-25 DIAGNOSIS — G4733 Obstructive sleep apnea (adult) (pediatric): Secondary | ICD-10-CM | POA: Insufficient documentation

## 2017-04-25 DIAGNOSIS — J3489 Other specified disorders of nose and nasal sinuses: Secondary | ICD-10-CM | POA: Insufficient documentation

## 2017-04-25 DIAGNOSIS — I110 Hypertensive heart disease with heart failure: Secondary | ICD-10-CM | POA: Diagnosis not present

## 2017-04-25 DIAGNOSIS — Z95810 Presence of automatic (implantable) cardiac defibrillator: Secondary | ICD-10-CM | POA: Insufficient documentation

## 2017-04-25 DIAGNOSIS — Z9889 Other specified postprocedural states: Secondary | ICD-10-CM

## 2017-04-25 HISTORY — PX: NASAL SEPTOPLASTY W/ TURBINOPLASTY: SHX2070

## 2017-04-25 LAB — GLUCOSE, CAPILLARY
Glucose-Capillary: 129 mg/dL — ABNORMAL HIGH (ref 65–99)
Glucose-Capillary: 149 mg/dL — ABNORMAL HIGH (ref 65–99)

## 2017-04-25 SURGERY — SEPTOPLASTY, NOSE, WITH NASAL TURBINATE REDUCTION
Anesthesia: General | Laterality: Bilateral

## 2017-04-25 MED ORDER — INSULIN ASPART 100 UNIT/ML ~~LOC~~ SOLN
6.0000 [IU] | Freq: Two times a day (BID) | SUBCUTANEOUS | Status: DC
  Administered 2017-04-26: 6 [IU] via SUBCUTANEOUS

## 2017-04-25 MED ORDER — CALCIUM POLYCARBOPHIL 625 MG PO TABS
1250.0000 mg | ORAL_TABLET | Freq: Three times a day (TID) | ORAL | Status: DC
  Administered 2017-04-25 – 2017-04-26 (×2): 1250 mg via ORAL
  Filled 2017-04-25 (×3): qty 2

## 2017-04-25 MED ORDER — KETAMINE HCL-SODIUM CHLORIDE 100-0.9 MG/10ML-% IV SOSY
PREFILLED_SYRINGE | INTRAVENOUS | Status: AC
  Filled 2017-04-25: qty 10

## 2017-04-25 MED ORDER — AMOXICILLIN 875 MG PO TABS: 875.0000 mg | ORAL_TABLET | Freq: Two times a day (BID) | ORAL | 0 refills | Status: AC

## 2017-04-25 MED ORDER — MORPHINE SULFATE (PF) 4 MG/ML IV SOLN
2.0000 mg | INTRAVENOUS | Status: DC | PRN
  Administered 2017-04-25: 2 mg via INTRAVENOUS
  Filled 2017-04-25: qty 1

## 2017-04-25 MED ORDER — POTASSIUM CHLORIDE CRYS ER 20 MEQ PO TBCR
40.0000 meq | EXTENDED_RELEASE_TABLET | Freq: Two times a day (BID) | ORAL | Status: DC
  Administered 2017-04-25 – 2017-04-26 (×2): 40 meq via ORAL
  Filled 2017-04-25 (×2): qty 2

## 2017-04-25 MED ORDER — MORPHINE SULFATE (PF) 4 MG/ML IV SOLN
INTRAVENOUS | Status: AC
  Filled 2017-04-25: qty 1

## 2017-04-25 MED ORDER — MORPHINE SULFATE (PF) 2 MG/ML IV SOLN
2.0000 mg | INTRAVENOUS | Status: DC | PRN
  Administered 2017-04-25: 2 mg via INTRAVENOUS

## 2017-04-25 MED ORDER — INSULIN ASPART 100 UNIT/ML FLEXPEN: 30.0000 [IU] | PEN_INJECTOR | Freq: Two times a day (BID) | SUBCUTANEOUS | Status: DC

## 2017-04-25 MED ORDER — OXYMETAZOLINE HCL 0.05 % NA SOLN
NASAL | Status: DC | PRN
  Administered 2017-04-25: 1 via TOPICAL

## 2017-04-25 MED ORDER — ROSUVASTATIN CALCIUM 40 MG PO TABS
40.0000 mg | ORAL_TABLET | Freq: Every day | ORAL | Status: DC
  Administered 2017-04-26: 40 mg via ORAL
  Filled 2017-04-25: qty 1

## 2017-04-25 MED ORDER — 0.9 % SODIUM CHLORIDE (POUR BTL) OPTIME
TOPICAL | Status: DC | PRN
  Administered 2017-04-25: 1000 mL

## 2017-04-25 MED ORDER — FENTANYL CITRATE (PF) 100 MCG/2ML IJ SOLN
INTRAMUSCULAR | Status: AC
  Filled 2017-04-25: qty 2

## 2017-04-25 MED ORDER — OXYCODONE-ACETAMINOPHEN 5-325 MG PO TABS: 2.0000 | ORAL_TABLET | Freq: Four times a day (QID) | ORAL | 0 refills | Status: DC | PRN

## 2017-04-25 MED ORDER — OXYMETAZOLINE HCL 0.05 % NA SOLN
NASAL | Status: AC
  Filled 2017-04-25: qty 15

## 2017-04-25 MED ORDER — FENTANYL CITRATE (PF) 250 MCG/5ML IJ SOLN
INTRAMUSCULAR | Status: AC
  Filled 2017-04-25: qty 5

## 2017-04-25 MED ORDER — MIDAZOLAM HCL 5 MG/5ML IJ SOLN
INTRAMUSCULAR | Status: DC | PRN
  Administered 2017-04-25: 2 mg via INTRAVENOUS

## 2017-04-25 MED ORDER — OXYCODONE-ACETAMINOPHEN 5-325 MG PO TABS
ORAL_TABLET | ORAL | Status: AC
  Administered 2017-04-25: 1 via ORAL
  Filled 2017-04-25: qty 1

## 2017-04-25 MED ORDER — LIDOCAINE-EPINEPHRINE 1 %-1:100000 IJ SOLN
INTRAMUSCULAR | Status: AC
  Filled 2017-04-25: qty 1

## 2017-04-25 MED ORDER — METOLAZONE 2.5 MG PO TABS
2.5000 mg | ORAL_TABLET | ORAL | Status: DC
  Administered 2017-04-25: 2.5 mg via ORAL
  Filled 2017-04-25: qty 1

## 2017-04-25 MED ORDER — NITROGLYCERIN 0.4 MG SL SUBL: 0.4000 mg | SUBLINGUAL_TABLET | SUBLINGUAL | Status: DC | PRN

## 2017-04-25 MED ORDER — LACTATED RINGERS IV SOLN
INTRAVENOUS | Status: DC
  Administered 2017-04-25 (×2): via INTRAVENOUS

## 2017-04-25 MED ORDER — PROPOFOL 10 MG/ML IV BOLUS
INTRAVENOUS | Status: AC
  Filled 2017-04-25: qty 40

## 2017-04-25 MED ORDER — FENTANYL CITRATE (PF) 100 MCG/2ML IJ SOLN
INTRAMUSCULAR | Status: DC | PRN
  Administered 2017-04-25: 25 ug via INTRAVENOUS

## 2017-04-25 MED ORDER — ETOMIDATE 2 MG/ML IV SOLN
INTRAVENOUS | Status: AC
  Filled 2017-04-25: qty 10

## 2017-04-25 MED ORDER — INSULIN DETEMIR 100 UNIT/ML ~~LOC~~ SOLN
60.0000 [IU] | Freq: Every day | SUBCUTANEOUS | Status: DC
  Administered 2017-04-25: 60 [IU] via SUBCUTANEOUS
  Filled 2017-04-25: qty 0.6

## 2017-04-25 MED ORDER — OXYCODONE-ACETAMINOPHEN 5-325 MG PO TABS
1.0000 | ORAL_TABLET | ORAL | Status: DC | PRN
  Administered 2017-04-25: 2 via ORAL
  Administered 2017-04-25: 1 via ORAL
  Administered 2017-04-26: 2 via ORAL
  Filled 2017-04-25 (×2): qty 2

## 2017-04-25 MED ORDER — PROPOFOL 10 MG/ML IV BOLUS
INTRAVENOUS | Status: DC | PRN
  Administered 2017-04-25: 70 mg via INTRAVENOUS

## 2017-04-25 MED ORDER — LACTATED RINGERS IV SOLN
INTRAVENOUS | Status: DC
  Administered 2017-04-25: 22:00:00 via INTRAVENOUS

## 2017-04-25 MED ORDER — FENTANYL CITRATE (PF) 100 MCG/2ML IJ SOLN
INTRAMUSCULAR | Status: AC
  Administered 2017-04-25: 25 ug via INTRAVENOUS
  Filled 2017-04-25: qty 2

## 2017-04-25 MED ORDER — BACITRACIN ZINC 500 UNIT/GM EX OINT
TOPICAL_OINTMENT | CUTANEOUS | Status: DC | PRN
  Administered 2017-04-25: 1 via TOPICAL

## 2017-04-25 MED ORDER — ETOMIDATE 2 MG/ML IV SOLN
INTRAVENOUS | Status: DC | PRN
  Administered 2017-04-25: 8 mg via INTRAVENOUS

## 2017-04-25 MED ORDER — SUCCINYLCHOLINE CHLORIDE 200 MG/10ML IV SOSY
PREFILLED_SYRINGE | INTRAVENOUS | Status: AC
  Filled 2017-04-25: qty 10

## 2017-04-25 MED ORDER — CARVEDILOL 6.25 MG PO TABS
6.2500 mg | ORAL_TABLET | Freq: Two times a day (BID) | ORAL | Status: DC
  Administered 2017-04-25 – 2017-04-26 (×2): 6.25 mg via ORAL
  Filled 2017-04-25 (×2): qty 1

## 2017-04-25 MED ORDER — EPHEDRINE 5 MG/ML INJ
INTRAVENOUS | Status: AC
  Filled 2017-04-25: qty 10

## 2017-04-25 MED ORDER — TORSEMIDE 20 MG PO TABS
40.0000 mg | ORAL_TABLET | Freq: Two times a day (BID) | ORAL | Status: DC
  Administered 2017-04-25: 40 mg via ORAL
  Filled 2017-04-25: qty 2

## 2017-04-25 MED ORDER — EPINEPHRINE PF 1 MG/ML IJ SOLN
INTRAMUSCULAR | Status: DC | PRN
  Administered 2017-04-25: .1 mg via INTRAVENOUS

## 2017-04-25 MED ORDER — ONDANSETRON HCL 4 MG/2ML IJ SOLN
INTRAMUSCULAR | Status: AC
  Filled 2017-04-25: qty 2

## 2017-04-25 MED ORDER — MEPERIDINE HCL 25 MG/ML IJ SOLN: 6.2500 mg | INTRAMUSCULAR | Status: DC | PRN

## 2017-04-25 MED ORDER — FENTANYL CITRATE (PF) 100 MCG/2ML IJ SOLN
25.0000 ug | INTRAMUSCULAR | Status: DC | PRN
  Administered 2017-04-25: 25 ug via INTRAVENOUS
  Administered 2017-04-25 (×2): 50 ug via INTRAVENOUS
  Administered 2017-04-25: 25 ug via INTRAVENOUS

## 2017-04-25 MED ORDER — CEFAZOLIN SODIUM 1 G IJ SOLR
INTRAMUSCULAR | Status: AC
  Filled 2017-04-25: qty 30

## 2017-04-25 MED ORDER — CEFAZOLIN SODIUM-DEXTROSE 2-3 GM-% IV SOLR
INTRAVENOUS | Status: DC | PRN
  Administered 2017-04-25: 3 g via INTRAVENOUS

## 2017-04-25 MED ORDER — ONDANSETRON HCL 4 MG/2ML IJ SOLN
INTRAMUSCULAR | Status: DC | PRN
  Administered 2017-04-25: 4 mg via INTRAVENOUS

## 2017-04-25 MED ORDER — INSULIN DETEMIR 100 UNIT/ML FLEXPEN: 60.0000 [IU] | PEN_INJECTOR | Freq: Every day | SUBCUTANEOUS | Status: DC

## 2017-04-25 MED ORDER — BACITRACIN ZINC 500 UNIT/GM EX OINT
TOPICAL_OINTMENT | CUTANEOUS | Status: AC
  Filled 2017-04-25: qty 28.35

## 2017-04-25 MED ORDER — PHENYLEPHRINE HCL 10 MG/ML IJ SOLN
INTRAMUSCULAR | Status: DC | PRN
  Administered 2017-04-25: 40 ug via INTRAVENOUS

## 2017-04-25 MED ORDER — MIDAZOLAM HCL 2 MG/2ML IJ SOLN
INTRAMUSCULAR | Status: AC
  Filled 2017-04-25: qty 2

## 2017-04-25 MED ORDER — KETAMINE HCL 10 MG/ML IJ SOLN
INTRAMUSCULAR | Status: DC | PRN
  Administered 2017-04-25: 30 mg via INTRAVENOUS

## 2017-04-25 MED ORDER — SUCCINYLCHOLINE CHLORIDE 20 MG/ML IJ SOLN
INTRAMUSCULAR | Status: DC | PRN
  Administered 2017-04-25: 200 mg via INTRAVENOUS

## 2017-04-25 MED ORDER — LIDOCAINE-EPINEPHRINE 1 %-1:100000 IJ SOLN
INTRAMUSCULAR | Status: DC | PRN
  Administered 2017-04-25: 20 mL

## 2017-04-25 MED ORDER — PHENYLEPHRINE 40 MCG/ML (10ML) SYRINGE FOR IV PUSH (FOR BLOOD PRESSURE SUPPORT)
PREFILLED_SYRINGE | INTRAVENOUS | Status: AC
  Filled 2017-04-25: qty 10

## 2017-04-25 SURGICAL SUPPLY — 28 items
CANISTER SUCT 3000ML PPV (MISCELLANEOUS) ×3 IMPLANT
COAGULATOR SUCT 6 FR SWTCH (ELECTROSURGICAL) ×1
COAGULATOR SUCT SWTCH 10FR 6 (ELECTROSURGICAL) ×2 IMPLANT
COVER SURGICAL LIGHT HANDLE (MISCELLANEOUS) ×2 IMPLANT
DRAPE HALF SHEET 40X57 (DRAPES) IMPLANT
ELECT REM PT RETURN 9FT ADLT (ELECTROSURGICAL) ×3
ELECTRODE REM PT RTRN 9FT ADLT (ELECTROSURGICAL) ×1 IMPLANT
GAUZE SPONGE 2X2 8PLY STRL LF (GAUZE/BANDAGES/DRESSINGS) ×1 IMPLANT
GLOVE ECLIPSE 7.5 STRL STRAW (GLOVE) ×3 IMPLANT
GLOVE SURG SS PI 7.0 STRL IVOR (GLOVE) ×6 IMPLANT
GOWN STRL REUS W/ TWL LRG LVL3 (GOWN DISPOSABLE) ×2 IMPLANT
GOWN STRL REUS W/ TWL XL LVL3 (GOWN DISPOSABLE) ×2 IMPLANT
GOWN STRL REUS W/TWL LRG LVL3 (GOWN DISPOSABLE) ×3
GOWN STRL REUS W/TWL XL LVL3 (GOWN DISPOSABLE) ×6
KIT BASIN OR (CUSTOM PROCEDURE TRAY) ×3 IMPLANT
KIT ROOM TURNOVER OR (KITS) ×3 IMPLANT
NEEDLE HYPO 25GX1X1/2 BEV (NEEDLE) ×3 IMPLANT
NS IRRIG 1000ML POUR BTL (IV SOLUTION) ×3 IMPLANT
PAD ARMBOARD 7.5X6 YLW CONV (MISCELLANEOUS) ×6 IMPLANT
SPLINT NASAL DOYLE BI-VL (GAUZE/BANDAGES/DRESSINGS) ×3 IMPLANT
SPONGE GAUZE 2X2 STER 10/PKG (GAUZE/BANDAGES/DRESSINGS) ×2
SPONGE NEURO XRAY DETECT 1X3 (DISPOSABLE) ×3 IMPLANT
SUT CHROMIC 3 0 SH 27 (SUTURE) ×3 IMPLANT
SUT PLAIN 4 0 ~~LOC~~ 1 (SUTURE) ×3 IMPLANT
SUT PROLENE 2 0 FS (SUTURE) ×3 IMPLANT
TRAY ENT MC OR (CUSTOM PROCEDURE TRAY) ×3 IMPLANT
TUBE SALEM SUMP 16 FR W/ARV (TUBING) IMPLANT
TUBING EXTENTION W/L.L. (IV SETS) IMPLANT

## 2017-04-25 NOTE — Anesthesia Postprocedure Evaluation (Signed)
Anesthesia Post Note  Patient: [name] [name]  Procedure(s) Performed: Procedure(s) (LRB): NASAL SEPTOPLASTY WITH TURBINATE REDUCTION (Bilateral)     Patient location during evaluation: PACU Anesthesia Type: General Level of consciousness: awake and alert Pain management: pain level controlled Vital Signs Assessment: post-procedure vital signs reviewed and stable Respiratory status: spontaneous breathing, nonlabored ventilation, respiratory function stable and patient connected to nasal cannula oxygen Cardiovascular status: blood pressure returned to baseline and stable Postop Assessment: no signs of nausea or vomiting Anesthetic complications: no Comments: D/c a-line Pt looks good    Last Vitals:  Vitals:   04/25/17 1130 04/25/17 1145  BP:    Pulse: 76 74  Resp: 16 (!) 23  Temp:    SpO2: 95% 96%    Last Pain:  Vitals:   04/25/17 1115  TempSrc:   PainSc: 0-No pain                 Doretta Remmert EDWARD

## 2017-04-25 NOTE — Progress Notes (Signed)
1845 received pt from PACU via bed, A&O x4. Nasal drip pad on with small bloody drainage. Pain is tolerable at this time per pt.

## 2017-04-25 NOTE — Discharge Instructions (Signed)

## 2017-04-25 NOTE — H&P (Signed)
Cc: Chronic nasal obstruction  HPI: The patient is a 66 year old male who returns today for his follow-up evaluation.  The patient has a history of chronic nasal obstruction.  He was last seen 1 month ago.  At that time, he was noted to have significant nasal obstruction, secondary to nasal mucosal congestion, septal deviation and bilateral inferior turbinate hypertrophy.  He was evaluated with a paranasal sinus CT scan. The CT showed no evidence of acute or chronic sinusitis.  However, he was noted to have significant nasal septal deviation, septal spur, and bilateral inferior turbinate hypertrophy.  The patient was previously treated with multiple courses of allergy medications and steroid nasal sprays without improvement in his symptoms.  He is interested in more definitive treatment of his chronic obstruction.   Exam: The nasal cavities were decongested and anesthetised with a combination of oxymetazoline and 4% lidocaine solution.  The flexible scope was inserted into the right nasal cavity.  Endoscopy of the inferior and middle meatus was performed.  Edematous mucosa was noted.  No polyp, mass, or lesion was appreciated. NSD noted. Olfactory cleft was clear.  Nasopharynx was clear.  Turbinates were hypertrophied but without mass.  Incomplete response to decongestion.  The procedure was repeated on the contralateral side with similar findings.  The patient tolerated the procedure well.  Instructions were given to avoid eating or drinking for 2 hours.   Assessment: 1.  Chronic rhinitis, with severe nasal septal deviation, nasal spurring, and bilateral inferior turbinate hypertrophy.  More than 95% of his nasal passageways are obstructed bilaterally.  2.  There is no evidence of acute or chronic sinusitis.   Plan: 1.  The nasal endoscopy findings and the CT images are reviewed with the patient.  2.  Based on the above findings, the patient will benefit from undergoing surgical intervention with  septoplasty and turbinate reduction.  The risks, benefits, alternatives and details of the procedures are reviewed.  3.  The patient would like to proceed with the procedures.

## 2017-04-25 NOTE — Transfer of Care (Signed)
Immediate Anesthesia Transfer of Care Note  Patient: [name] [name]  Procedure(s) Performed: Procedure(s): NASAL SEPTOPLASTY WITH TURBINATE REDUCTION (Bilateral)  Patient Location: PACU  Anesthesia Type:General  Level of Consciousness: awake, alert , oriented and sedated  Airway & Oxygen Therapy: Patient Spontanous Breathing and Patient connected to face mask oxygen  Post-op Assessment: Report given to RN, Post -op Vital signs reviewed and stable and Patient moving all extremities  Post vital signs: Reviewed and stable  Last Vitals:  Vitals:   04/25/17 0707 04/25/17 1021  BP: 123/79 (!) 140/98  Pulse: 71 80  Resp: 20 11  Temp: 36.8 C (!) 36.1 C  SpO2: 99% 96%    Last Pain:  Vitals:   04/25/17 1021  TempSrc:   PainSc: (P) 0-No pain      Patients Stated Pain Goal: 3 (34/74/25 9563)  Complications: No apparent anesthesia complications

## 2017-04-25 NOTE — Anesthesia Procedure Notes (Signed)
Procedure Name: Intubation Date/Time: 04/25/2017 9:05 AM Performed by: Scheryl Darter Pre-anesthesia Checklist: Patient identified, Emergency Drugs available, Suction available and Patient being monitored Patient Re-evaluated:Patient Re-evaluated prior to induction Oxygen Delivery Method: Circle System Utilized Preoxygenation: Pre-oxygenation with 100% oxygen Induction Type: IV induction Ventilation: Mask ventilation without difficulty Laryngoscope Size: Glidescope Grade View: Grade II Tube type: Oral Number of attempts: 1 Airway Equipment and Method: Stylet,  Oral airway,  Patient positioned with wedge pillow,  Video-laryngoscopy and Rigid stylet Placement Confirmation: ETT inserted through vocal cords under direct vision,  positive ETCO2 and breath sounds checked- equal and bilateral Secured at: 23 cm Tube secured with: Tape Dental Injury: Teeth and Oropharynx as per pre-operative assessment  Difficulty Due To: Difficulty was anticipated, Difficult Airway- due to large tongue, Difficult Airway- due to reduced neck mobility and Difficult Airway- due to limited oral opening Comments: Morbid obesity

## 2017-04-25 NOTE — Op Note (Signed)
DATE OF PROCEDURE: 04/25/2017  OPERATIVE REPORT   SURGEON: Leta Baptist, MD   PREOPERATIVE DIAGNOSES:  1. Severe nasal septal deviation.  2. Bilateral inferior turbinate hypertrophy.  3. Chronic nasal obstruction.  POSTOPERATIVE DIAGNOSES:  1. Severe nasal septal deviation.  2. Bilateral inferior turbinate hypertrophy.  3. Chronic nasal obstruction.  PROCEDURE PERFORMED:  1. Septoplasty.  2. Bilateral partial inferior turbinate resection.   ANESTHESIA: General endotracheal tube anesthesia.   COMPLICATIONS: None.   ESTIMATED BLOOD LOSS: Less than 100 mL.   INDICATION FOR PROCEDURE: [name] [name] is a 66 y.o. male with a history of chronic nasal obstruction. The patient was  treated with antihistamine, decongestant, and steroid nasal sprays. However, the patient continued to be symptomatic. On examination, the patient was noted to have bilateral severe inferior turbinate hypertrophy and significant nasal septal deviation, causing significant nasal obstruction. Based on the above findings, the decision was made for the patient to undergo the above-stated procedure. The risks, benefits, alternatives, and details of the procedure were discussed with the patient. Questions were invited and answered. Informed consent was obtained.   DESCRIPTION OF PROCEDURE: The patient was taken to the operating room and placed supine on the operating table. General endotracheal tube anesthesia was administered by the anesthesiologist. The patient was positioned, and prepped and draped in the standard fashion for nasal surgery. Pledgets soaked with Afrin were placed in both nasal cavities for decongestion. The pledgets were subsequently removed. The above mentioned severe septal deviation was again noted. 1% lidocaine with 1:100,000 epinephrine was injected onto the nasal septum bilaterally. A hemitransfixion incision was made on the left side. The mucosal flap was carefully elevated on the left side. A  cartilaginous incision was made 1 cm superior to the caudal margin of the nasal septum. Mucosal flap was also elevated on the right side in the similar fashion. It should be noted that due to the severe septal deviation, the deviated portion of the cartilaginous and bony septum had to be removed in piecemeal fashion. Once the deviated portions were removed, a straight midline septum was achieved. The septum was then quilted with 4-0 plain gut sutures. The hemitransfixion incision was closed with interrupted 4-0 chromic sutures. Doyle splints were applied.   Prior to the [facility] splint application, the inferior one half of both hypertrophied inferior turbinate was crossclamped with a Kelly clamp. The inferior one half of each inferior turbinate was then resected with a pair of cross cutting scissors. Hemostasis was achieved with a suction cautery device.   The care of the patient was turned over to the anesthesiologist. The patient was awakened from anesthesia without difficulty. The patient was extubated and transferred to the recovery room in good condition.   OPERATIVE FINDINGS: Severe nasal septal deviation and bilateral inferior turbinate hypertrophy.   SPECIMEN: None.   FOLLOWUP CARE: The patient be observed overnight in the hospital. The patient will follow up in my office in approximately 1 week for splint removal.   Chany Woolworth Raynelle Bring, MD

## 2017-04-26 ENCOUNTER — Encounter (HOSPITAL_COMMUNITY): Payer: Self-pay | Admitting: Otolaryngology

## 2017-04-26 DIAGNOSIS — J3489 Other specified disorders of nose and nasal sinuses: Secondary | ICD-10-CM | POA: Diagnosis not present

## 2017-04-26 DIAGNOSIS — J310 Chronic rhinitis: Secondary | ICD-10-CM | POA: Diagnosis not present

## 2017-04-26 DIAGNOSIS — J343 Hypertrophy of nasal turbinates: Secondary | ICD-10-CM | POA: Diagnosis not present

## 2017-04-26 DIAGNOSIS — J342 Deviated nasal septum: Secondary | ICD-10-CM | POA: Diagnosis not present

## 2017-04-26 DIAGNOSIS — Z79899 Other long term (current) drug therapy: Secondary | ICD-10-CM | POA: Diagnosis not present

## 2017-04-26 DIAGNOSIS — I509 Heart failure, unspecified: Secondary | ICD-10-CM | POA: Diagnosis not present

## 2017-04-26 NOTE — Progress Notes (Signed)
Notified patient the doctor put in d/c orders for him, the patient said he could not get a ride until around 1 pm today so I told him we would go over everything closer to then.

## 2017-04-26 NOTE — Discharge Summary (Signed)
Physician Discharge Summary  Patient ID: [name] [name] MRN: [id number] DOB/AGE: 1951-04-17 66 y.o.  Admit date: 04/25/2017 Discharge date: 04/26/2017  Admission Diagnoses: Chronic nasal obstruction  Discharge Diagnoses: Chronic nasal obstruction Active Problems:   S/P nasal septoplasty   Discharged Condition: good  Hospital Course: Pt had an uneventful overnight stay. No significant bleeding. No desat.  Consults: None  Significant Diagnostic Studies: None.  Treatments: surgery: Septoplasty and turbinate reduction  Discharge Exam: Blood pressure 110/72, pulse 70, temperature 98.2 F (36.8 C), temperature source Oral, resp. rate 18, height 5\' 10"  (1.778 m), weight 272 lb (123.4 kg), SpO2 97 %. No significant bleeding  Disposition: 01-Home or Self Care  Discharge Instructions    Activity as tolerated - No restrictions    Complete by:  As directed    Diet general    Complete by:  As directed      Allergies as of 04/26/2017      Reactions   Dyflex-g [dyphylline-guaifenesin] Hives      Medication List    STOP taking these medications   aspirin 325 MG tablet   clopidogrel 75 MG tablet Commonly known as:  PLAVIX   HYDROcodone-acetaminophen 10-325 MG tablet Commonly known as:  NORCO     TAKE these medications   amoxicillin 875 MG tablet Commonly known as:  AMOXIL Take 1 tablet (875 mg total) by mouth 2 (two) times daily.   carvedilol 6.25 MG tablet Commonly known as:  COREG Take 1 tablet (6.25 mg total) by mouth 2 (two) times daily with a meal.   CENTRUM SILVER PO Take 1 tablet by mouth daily.   insulin aspart 100 UNIT/ML FlexPen Commonly known as:  NOVOLOG FLEXPEN Inject 6 units into skin with each meal. May titrate up to 14 units. What changed:  how much to take  how to take this  when to take this  additional instructions   Insulin Detemir 100 UNIT/ML Pen Commonly known as:  LEVEMIR Inject 12 units into skin each evening. May titrate up to 60  units. What changed:  how much to take  how to take this  when to take this  additional instructions   metolazone 2.5 MG tablet Commonly known as:  ZAROXOLYN Take 2.5 mg by mouth every 7 (seven) days. Friday   nitroGLYCERIN 0.4 MG SL tablet Commonly known as:  NITROSTAT PLACE 1 TABLET UNDER TONGUE FOR CHEST PAIN. MAY REPEAT EVERY 5 MIN UPTO 3 DOSES-NO RELIEF,CALL 911.   OVER THE COUNTER MEDICATION Place 1 drop into both eyes daily. "dry eyes"   oxyCODONE-acetaminophen 5-325 MG tablet Commonly known as:  ROXICET Take 2 tablets by mouth every 6 (six) hours as needed for severe pain.   polycarbophil 625 MG tablet Commonly known as:  FIBERCON Take 2 tablets (1,250 mg total) by mouth daily with lunch. Hold if you have diarrhea What changed:  when to take this  additional instructions   potassium chloride SA 20 MEQ tablet Commonly known as:  K-DUR,KLOR-CON Take 2 tablets (40 mEq total) by mouth 2 (two) times daily.   rosuvastatin 40 MG tablet Commonly known as:  CRESTOR Take 1 tablet (40 mg total) by mouth daily.   torsemide 20 MG tablet Commonly known as:  DEMADEX TAKE (2) TABLETS BY MOUTH TWICE DAILY. What changed:  See the new instructions.   trolamine salicylate 10 % cream Commonly known as:  ASPERCREME Apply 1 application topically as needed for muscle pain.  Discharge Care Instructions        Start     Ordered   04/26/17 0000  Activity as tolerated - No restrictions     04/26/17 0729   04/26/17 0000  Diet general     04/26/17 0729   04/25/17 0000  oxyCODONE-acetaminophen (ROXICET) 5-325 MG tablet  Every 6 hours PRN     04/25/17 1032   04/25/17 0000  amoxicillin (AMOXIL) 875 MG tablet  2 times daily     04/25/17 1032     Follow-up Information    Leta Baptist, MD On 05/03/2017.   Specialty:  Otolaryngology Why:  As scheduled Contact information: [address] [place] [address] [phone]           Signed: Burley Saver 04/26/2017, 7:29 AM

## 2017-04-26 NOTE — Progress Notes (Signed)
[name] [name] to be D/C'd  per MD order. Discussed with the patient and all questions fully answered.  VSS, Skin clean, dry and intact without evidence of skin break down, no evidence of skin tears noted.  IV catheter discontinued intact. Site without signs and symptoms of complications. Dressing and pressure applied.  An After Visit Summary was printed and given to the patient. Patient received prescription.  D/c education completed with patient/family including follow up instructions, medication list, d/c activities limitations if indicated, with other d/c instructions as indicated by MD - patient able to verbalize understanding, all questions fully answered.   Patient instructed to return to ED, call 911, or call MD for any changes in condition.   Patient to be escorted via [place], and D/C home via private auto.  Verified with wife in person that she received d/c prescriptions yesterday from doctor.

## 2017-05-03 ENCOUNTER — Ambulatory Visit (INDEPENDENT_AMBULATORY_CARE_PROVIDER_SITE_OTHER): Payer: Medicare Other | Admitting: Otolaryngology

## 2017-05-11 ENCOUNTER — Telehealth: Payer: Self-pay | Admitting: Family Medicine

## 2017-05-11 NOTE — Telephone Encounter (Signed)
Pt wanted [place] to let you know that he is out of the hospital,and says thank you for everything.

## 2017-05-11 NOTE — Telephone Encounter (Signed)
Patient is requesting for Larene Beach to call him back.

## 2017-05-17 ENCOUNTER — Ambulatory Visit (INDEPENDENT_AMBULATORY_CARE_PROVIDER_SITE_OTHER): Payer: Medicare Other | Admitting: Otolaryngology

## 2017-05-24 DIAGNOSIS — R972 Elevated prostate specific antigen [PSA]: Secondary | ICD-10-CM | POA: Diagnosis not present

## 2017-05-28 ENCOUNTER — Ambulatory Visit (INDEPENDENT_AMBULATORY_CARE_PROVIDER_SITE_OTHER): Payer: Medicare Other | Admitting: *Deleted

## 2017-05-28 ENCOUNTER — Encounter: Payer: Medicare Other | Admitting: Cardiology

## 2017-05-28 DIAGNOSIS — I255 Ischemic cardiomyopathy: Secondary | ICD-10-CM

## 2017-05-28 NOTE — Progress Notes (Signed)
Remote ICD transmission.   

## 2017-05-29 LAB — CUP PACEART REMOTE DEVICE CHECK
Battery Remaining Longevity: 133 mo
Battery Voltage: 3.03 V
HighPow Impedance: 84 Ohm
Implantable Lead Location: 753860
Implantable Pulse Generator Implant Date: 20170921
Lead Channel Pacing Threshold Pulse Width: 0.4 ms
Lead Channel Setting Pacing Amplitude: 2.5 V
Lead Channel Setting Pacing Pulse Width: 0.4 ms
Lead Channel Setting Sensing Sensitivity: 0.3 mV
MDC IDC LEAD IMPLANT DT: 20170921
MDC IDC MSMT LEADCHNL RV IMPEDANCE VALUE: 437 Ohm
MDC IDC MSMT LEADCHNL RV IMPEDANCE VALUE: 494 Ohm
MDC IDC MSMT LEADCHNL RV PACING THRESHOLD AMPLITUDE: 0.625 V
MDC IDC MSMT LEADCHNL RV SENSING INTR AMPL: 4.5 mV
MDC IDC MSMT LEADCHNL RV SENSING INTR AMPL: 4.5 mV
MDC IDC SESS DTM: 20181001122606
MDC IDC STAT BRADY RV PERCENT PACED: 0.02 %

## 2017-05-30 ENCOUNTER — Ambulatory Visit (INDEPENDENT_AMBULATORY_CARE_PROVIDER_SITE_OTHER): Payer: Medicare Other | Admitting: Urology

## 2017-05-30 ENCOUNTER — Encounter: Payer: Self-pay | Admitting: Cardiology

## 2017-05-30 DIAGNOSIS — R351 Nocturia: Secondary | ICD-10-CM

## 2017-05-30 DIAGNOSIS — R972 Elevated prostate specific antigen [PSA]: Secondary | ICD-10-CM

## 2017-06-15 ENCOUNTER — Encounter: Payer: Self-pay | Admitting: Cardiology

## 2017-06-19 NOTE — Progress Notes (Signed)
he   Electrophysiology Office Note   Date:  06/20/2017   ID:  ASHON ROSENBERG, DOB 1951/01/30, MRN [id number]  PCP:  Kathyrn Drown, MD  Cardiologist:  Johnsie Cancel Primary Electrophysiologist:  Milferd Ansell Meredith Leeds, MD    Chief Complaint  Patient presents with  . Defib Check    Ischemic cardiomyopathy/Chronic systolic CHF     History of Present Illness: [name] [name] is a 66 y.o. male who presents today for electrophysiology evaluation.   He has a history of morbid obesity, hypertension, hyperlipidemia, type 2 diabetes, ischemic cardiomyopathy with an EF of 15%, coronary disease status post CABG 6 in 2001 and subsequent PCI. He was admitted to [facility] 2/17 with CHF. He had a Medtronic ICD implanted 05/18/16.   Today, denies symptoms of palpitations, chest pain, shortness of breath, orthopnea, PND, lower extremity edema, claudication, dizziness, presyncope, syncope, bleeding, or neurologic sequela. The patient is tolerating medications without difficulties. He has been doing well without major complaint. He recently had nasal septoplasty in August. He is breathing has been much better since that time. He has been able to ambulate up and down his driveway without issues. He is going to start an exercise plan as his nose heals.    Past Medical History:  Diagnosis Date  . AICD (automatic cardioverter/defibrillator) present 05/18/2016  . Anginal pain ([place])    occ none recent  . Arthritis   . CAD (coronary artery disease)   . Cardiomyopathy, ischemic   . CHF (congestive heart failure) ([place])   . Chronic back pain   . Diabetes mellitus without complication (HCC)    prediabetes  . Dyspnea    rarely  . Elevated PSA   . History of kidney stones   . Hyperlipidemia   . Morbid obesity ([place])   . Myocardial infarction ([place])   . OSA (obstructive sleep apnea)    does not wear CPAP, -order run out told need to redo  test   Past Surgical History:  Procedure Laterality Date  .  APPENDECTOMY    . CARDIAC CATHETERIZATION     stents  . CAROTID STENT     denies carotid stent  . CATARACT EXTRACTION, BILATERAL    . CORONARY ARTERY BYPASS GRAFT  2001  . EP IMPLANTABLE DEVICE N/A 05/18/2016   Procedure: ICD Implant;  Surgeon: Saloma Cadena Meredith Leeds, MD;  Location: [facility];  Service: Cardiovascular;  Laterality: N/A;  . EYE SURGERY    . ingrown toenail N/A   . LEFT HEART CATHETERIZATION WITH CORONARY/GRAFT ANGIOGRAM N/A 12/21/2014   Procedure: LEFT HEART CATHETERIZATION WITH Beatrix Fetters;  Surgeon: Belva Crome, MD;  Location: [facility] CATH LAB;  Service: Cardiovascular;  Laterality: N/A;  . NASAL SEPTOPLASTY W/ TURBINOPLASTY Bilateral 04/25/2017   NASAL SEPTOPLASTY WITH TURBINATE REDUCTION/notes 04/25/2017  . NASAL SEPTOPLASTY W/ TURBINOPLASTY Bilateral 04/25/2017   Procedure: NASAL SEPTOPLASTY WITH TURBINATE REDUCTION;  Surgeon: Leta Baptist, MD;  Location: [facility];  Service: ENT;  Laterality: Bilateral;  . TONSILLECTOMY       Current Outpatient Prescriptions  Medication Sig Dispense Refill  . carvedilol (COREG) 6.25 MG tablet Take 1 tablet (6.25 mg total) by mouth 2 (two) times daily with a meal. 60 tablet 6  . HYDROcodone-acetaminophen (NORCO) 10-325 MG tablet Take 2 tablets by mouth daily.    . insulin aspart (NOVOLOG FLEXPEN) 100 UNIT/ML FlexPen Inject 6 units into skin with each meal. May titrate up to 14 units. (Patient taking differently: Inject 30 Units into  the skin 2 (two) times daily. ) 15 mL 5  . Insulin Detemir (LEVEMIR) 100 UNIT/ML Pen Inject 12 units into skin each evening. May titrate up to 60 units. (Patient taking differently: Inject 60 Units into the skin at bedtime. ) 15 mL 5  . metolazone (ZAROXOLYN) 2.5 MG tablet Take 2.5 mg by mouth every 7 (seven) days. Friday    . Multiple Vitamins-Minerals (CENTRUM SILVER PO) Take 1 tablet by mouth daily.     . nitroGLYCERIN (NITROSTAT) 0.4 MG SL tablet PLACE 1 TABLET UNDER TONGUE FOR CHEST PAIN. MAY  REPEAT EVERY 5 MIN UPTO 3 DOSES-NO RELIEF,CALL 911. 25 tablet 6  . OVER THE COUNTER MEDICATION Place 1 drop into both eyes daily. "dry eyes"    . polycarbophil (FIBERCON) 625 MG tablet Take 2 tablets (1,250 mg total) by mouth daily with lunch. Hold if you have diarrhea (Patient taking differently: Take 1,250 mg by mouth 3 (three) times daily. Hold if you have diarrhea) 30 tablet 0  . potassium chloride SA (K-DUR,KLOR-CON) 20 MEQ tablet Take 2 tablets (40 mEq total) by mouth 2 (two) times daily. 360 tablet 3  . rosuvastatin (CRESTOR) 40 MG tablet Take 1 tablet (40 mg total) by mouth daily. 90 tablet 2  . torsemide (DEMADEX) 20 MG tablet TAKE (2) TABLETS BY MOUTH TWICE DAILY. (Patient taking differently: Take 40 mg by mouth bid) 180 tablet 0  . trolamine salicylate (ASPERCREME) 10 % cream Apply 1 application topically as needed for muscle pain.     No current facility-administered medications for this visit.     Allergies:   Dyflex-g [dyphylline-guaifenesin]   Social History:  The patient  reports that he quit smoking about 17 years ago. He has never used smokeless tobacco. He reports that he does not drink alcohol or use drugs.   Family History:  The patient's family history includes CAD in his mother; Diabetes in his mother. He was adopted.    ROS:  Please see the history of present illness.   Otherwise, review of systems is positive for back pain, muscle pain.   All other systems are reviewed and negative.   PHYSICAL EXAM: VS:  BP 138/72   Pulse (!) 58   Ht 5' 10.5" (1.791 m)   Wt 230 lb (104.3 kg)   BMI 32.54 kg/m  , BMI Body mass index is 32.54 kg/m. GEN: Well nourished, well developed, in no acute distress  HEENT: normal  Neck: no JVD, carotid bruits, or masses Cardiac: RRR; no murmurs, rubs, or gallops,no edema  Respiratory:  clear to auscultation bilaterally, normal work of breathing GI: soft, nontender, nondistended, + BS MS: no deformity or atrophy  Skin: warm and dry,  device site well healed Neuro:  Strength and sensation are intact Psych: euthymic mood, full affect  EKG:  EKG is ordered today. Personal review of the ekg ordered shows sinus rhythm, 1 degree AV block, rate 58  Personal review of the device interrogation today. Results in [facility]: 09/11/2016: B Natriuretic Peptide 733.0; TSH 3.363 01/15/2017: Magnesium 2.0 02/27/2017: ALT 15 04/23/2017: BUN 14; Creatinine, Ser 1.03; Hemoglobin 14.4; Platelets 269; Potassium 3.7; Sodium 142    Lipid Panel     Component Value Date/Time   CHOL 137 02/27/2017 1117   TRIG 222 (H) 02/27/2017 1117   HDL 34 (L) 02/27/2017 1117   CHOLHDL 4.0 02/27/2017 1117   CHOLHDL 3.9 01/02/2015 1010   VLDL 31 01/02/2015 1010   LDLCALC 59 02/27/2017 1117  Wt Readings from Last 3 Encounters:  06/20/17 230 lb (104.3 kg)  04/25/17 272 lb (123.4 kg)  04/23/17 288 lb 4.8 oz (130.8 kg)      Other studies Reviewed: Additional studies/ records that were reviewed today include: TTE 05/08/16  - Left ventricle: Extremely poor acoustic windows limit study Even   with contrast use. Overall LVEF is probably moderately to   severely depressed. The cavity size was severely dilated. Wall   thickness was normal. Doppler parameters are consistent with a   reversible restrictive pattern, indicative of decreased left   ventricular diastolic compliance and/or increased left atrial   pressure (grade 3 diastolic dysfunction). - Aortic valve: AV is thckened, calcified . Leaflets are diffcult   to see Peak and mean graidents through the valve are 31 and 22 mm   Hg respectivel y Consider TEE to furgher evaluate. - Mitral valve: There was mild regurgitation. - Left atrium: The atrium was severely dilated. - Pulmonary arteries: PA peak pressure: 36 mm Hg (S).  ASSESSMENT AND PLAN:  1.  Ischemic cardiomyopathy: Ejection fraction 15%. Medtronic single-chamber ICD placed 05/18/16. He is currently not showing signs of  volume overload, though his optimal is elevated. I told him to weigh himself daily. He understands he can adjust his Lasix as needed at home. EF is 15%.   2. CAD: Currently feeling well without chest pain.  3. Hypertension: Blood pressures well controlled. No changes at this time.   Current medicines are reviewed at length with the patient today.   The patient does not have concerns regarding his medicines.  The following changes were made today:  None  Labs/ tests ordered today include:  No orders of the defined types were placed in this encounter.    Disposition:   FU with Tyteanna Ost 12 months  Signed, Francella Barnett Meredith Leeds, MD  06/20/2017 9:08 AM     [facility] [address] [place] [address] [phone] (office) [phone] (fax)

## 2017-06-20 ENCOUNTER — Encounter: Payer: Self-pay | Admitting: Cardiology

## 2017-06-20 ENCOUNTER — Ambulatory Visit (INDEPENDENT_AMBULATORY_CARE_PROVIDER_SITE_OTHER): Payer: Medicare Other | Admitting: Cardiology

## 2017-06-20 VITALS — BP 138/72 | HR 58 | Ht 70.5 in | Wt 230.0 lb

## 2017-06-20 DIAGNOSIS — Z4502 Encounter for adjustment and management of automatic implantable cardiac defibrillator: Secondary | ICD-10-CM

## 2017-06-20 DIAGNOSIS — I25118 Atherosclerotic heart disease of native coronary artery with other forms of angina pectoris: Secondary | ICD-10-CM | POA: Diagnosis not present

## 2017-06-20 DIAGNOSIS — I5022 Chronic systolic (congestive) heart failure: Secondary | ICD-10-CM | POA: Diagnosis not present

## 2017-06-20 DIAGNOSIS — I255 Ischemic cardiomyopathy: Secondary | ICD-10-CM

## 2017-06-20 DIAGNOSIS — I209 Angina pectoris, unspecified: Secondary | ICD-10-CM

## 2017-06-20 LAB — CUP PACEART INCLINIC DEVICE CHECK
Date Time Interrogation Session: 20181024100219
HIGH POWER IMPEDANCE MEASURED VALUE: 71 Ohm
Implantable Lead Location: 753860
Implantable Pulse Generator Implant Date: 20170921
Lead Channel Setting Pacing Amplitude: 2.5 V
Lead Channel Setting Pacing Pulse Width: 0.4 ms
MDC IDC LEAD IMPLANT DT: 20170921
MDC IDC MSMT BATTERY REMAINING LONGEVITY: 132 mo
MDC IDC MSMT LEADCHNL RV IMPEDANCE VALUE: 437 Ohm
MDC IDC SET LEADCHNL RV SENSING SENSITIVITY: 0.3 mV

## 2017-06-20 NOTE — Patient Instructions (Signed)
Medication Instructions:  Your physician recommends that you continue on your current medications as directed. Please refer to the Current Medication list given to you today.  Labwork: None ordered  Testing/Procedures: None ordered  Follow-Up: Remote monitoring is used to monitor your Pacemaker or ICD from home. This monitoring reduces the number of office visits required to check your device to one time per year. It allows [place] to keep an eye on the functioning of your device to ensure it is working properly. You are scheduled for a device check from home on 08/27/2017. You may send your transmission at any time that day. If you have a wireless device, the transmission will be sent automatically. After your physician reviews your transmission, you will receive a postcard with your next transmission date.  Your physician wants you to follow-up in: 1 year with Dr. Curt Bears.  You will receive a reminder letter in the mail two months in advance. If you don't receive a letter, please call our office to schedule the follow-up appointment.  --- If you need a refill on your cardiac medications before your next appointment, please call your pharmacy. ---  Thank you for choosing [facility]!!   Trinidad Curet, RN [phone]  Any Other Special Instructions Will Be Listed Below (If Applicable).

## 2017-06-25 ENCOUNTER — Ambulatory Visit (INDEPENDENT_AMBULATORY_CARE_PROVIDER_SITE_OTHER): Payer: Medicare Other | Admitting: Family Medicine

## 2017-06-25 ENCOUNTER — Encounter: Payer: Self-pay | Admitting: Family Medicine

## 2017-06-25 VITALS — BP 116/68 | Ht 70.5 in | Wt 283.4 lb

## 2017-06-25 DIAGNOSIS — I255 Ischemic cardiomyopathy: Secondary | ICD-10-CM

## 2017-06-25 DIAGNOSIS — E119 Type 2 diabetes mellitus without complications: Secondary | ICD-10-CM | POA: Diagnosis not present

## 2017-06-25 DIAGNOSIS — Z23 Encounter for immunization: Secondary | ICD-10-CM

## 2017-06-25 DIAGNOSIS — E782 Mixed hyperlipidemia: Secondary | ICD-10-CM

## 2017-06-25 DIAGNOSIS — N183 Chronic kidney disease, stage 3 unspecified: Secondary | ICD-10-CM

## 2017-06-25 DIAGNOSIS — I10 Essential (primary) hypertension: Secondary | ICD-10-CM

## 2017-06-25 MED ORDER — HYDROCODONE-ACETAMINOPHEN 10-325 MG PO TABS: ORAL_TABLET | ORAL | 0 refills | Status: DC

## 2017-06-25 MED ORDER — CLOPIDOGREL BISULFATE 75 MG PO TABS: 75.0000 mg | ORAL_TABLET | Freq: Every day | ORAL | 3 refills | Status: DC

## 2017-06-25 MED ORDER — ROSUVASTATIN CALCIUM 40 MG PO TABS: 40.0000 mg | ORAL_TABLET | Freq: Every day | ORAL | 2 refills | Status: DC

## 2017-06-25 NOTE — Progress Notes (Addendum)
   Subjective:    Patient ID: [name] [name], male    DOB: March 18, 1951, 66 y.o.   MRN: [id number]  Hypertension  This is a new problem. The current episode started more than 1 year ago. Pertinent negatives include no chest pain. Risk factors for coronary artery disease include diabetes mellitus, dyslipidemia, male gender and obesity. Treatments tried: coreg. There are no compliance problems.    HgbA1c not due until 07/24/17- last one drawn in hospital 04/23/17 patient relates he is taking his insulin.  He does state that he is not had any low sugar spells.  He thinks his that his sugars are running in the 120-140 range.  He does try to watch how he eats  He does suffer with morbid obesity and cardiomyopathy takes his diuretic his blood pressure medicine he also states he takes Plavix on a daily basis and this was not on his med list but he states he has been on it researching it as best as possible but he has been on Plavix for years not sure why it is not on his med list  He does use pain medicine once or twice a day in order to help keep his pain under control  Patient has a history of mild renal insufficiency he tries to eat sensibly and take his medicines accordingly  Taking plavix one daily  Review of Systems  Constitutional: Negative for activity change, appetite change and fatigue.  HENT: Negative for congestion.   Respiratory: Negative for cough.   Cardiovascular: Negative for chest pain.  Gastrointestinal: Negative for abdominal pain.  Endocrine: Negative for polydipsia and polyphagia.  Neurological: Negative for weakness.  Psychiatric/Behavioral: Negative for confusion.       Objective:   Physical Exam  Constitutional: He appears well-nourished. No distress.  Cardiovascular: Normal rate, regular rhythm and normal heart sounds.   No murmur heard. Pulmonary/Chest: Effort normal and breath sounds normal. No respiratory distress.  Musculoskeletal: He exhibits no edema.    Lymphadenopathy:    He has no cervical adenopathy.  Neurological: He is alert.  Psychiatric: His behavior is normal.  Vitals reviewed.  Diabetic foot exam normal       Assessment & Plan:  Blood pressure good control today continue current measures  Cardiomyopathy stable no sign of acute failure  Chronic low back pain intermittent leg pain with his right foot previous injury recommend hydrocodone no more than 2/day 3 prescriptions of 60 tablets were given  Hyperlipidemia lipid liver profile and watching diet and continuing medication  History of renal insufficiency check metabolic 7 advised the patient not to take potassium more than 2 pills twice daily  Diabetes decent control last A1c acceptable no A1c on this visit  25 minutes was spent with the patient. Greater than half the time was spent in discussion and answering questions and counseling regarding the issues that the patient came in for today.

## 2017-06-26 LAB — BASIC METABOLIC PANEL
BUN/Creatinine Ratio: 16 (ref 10–24)
BUN: 16 mg/dL (ref 8–27)
CO2: 29 mmol/L (ref 20–29)
Calcium: 9.2 mg/dL (ref 8.6–10.2)
Chloride: 97 mmol/L (ref 96–106)
Creatinine, Ser: 0.99 mg/dL (ref 0.76–1.27)
GFR, EST AFRICAN AMERICAN: 91 mL/min/{1.73_m2} (ref >59)
GFR, EST NON AFRICAN AMERICAN: 79 mL/min/{1.73_m2} (ref >59)
Glucose: 148 mg/dL — ABNORMAL HIGH (ref 65–99)
POTASSIUM: 4.2 mmol/L (ref 3.5–5.2)
SODIUM: 141 mmol/L (ref 134–144)

## 2017-06-26 LAB — LIPID PANEL
CHOLESTEROL TOTAL: 116 mg/dL (ref 100–199)
Chol/HDL Ratio: 3.3 ratio (ref 0.0–5.0)
HDL: 35 mg/dL — AB (ref >39)
LDL CALC: 52 mg/dL (ref 0–99)
Triglycerides: 143 mg/dL (ref 0–149)
VLDL CHOLESTEROL CAL: 29 mg/dL (ref 5–40)

## 2017-06-26 LAB — HEPATIC FUNCTION PANEL
ALBUMIN: 4.5 g/dL (ref 3.6–4.8)
ALK PHOS: 80 IU/L (ref 39–117)
ALT: 11 IU/L (ref 0–44)
AST: 19 IU/L (ref 0–40)
Bilirubin Total: 0.8 mg/dL (ref 0.0–1.2)
Bilirubin, Direct: 0.19 mg/dL (ref 0.00–0.40)
Total Protein: 7.8 g/dL (ref 6.0–8.5)

## 2017-06-29 ENCOUNTER — Telehealth: Payer: Self-pay | Admitting: Family Medicine

## 2017-06-29 NOTE — Telephone Encounter (Signed)
Noted good for him

## 2017-06-29 NOTE — Telephone Encounter (Signed)
Patient called stating that he purchased some diabetic/ medical shoes that he has worn for the past 24 hours. He stated that the shoes have improved his pain greatly and since wearing them he has not needed any pain pills. He said this is an Pharmacist, hospital for you. He is going to continue wearing them to see if they improve his pain and if so he will let us know if he no longer needs his pain medications any longer.

## 2017-08-03 ENCOUNTER — Ambulatory Visit: Payer: Medicare Other | Admitting: Cardiovascular Disease

## 2017-08-27 ENCOUNTER — Ambulatory Visit (INDEPENDENT_AMBULATORY_CARE_PROVIDER_SITE_OTHER): Payer: Medicare Other | Admitting: *Deleted

## 2017-08-27 DIAGNOSIS — I255 Ischemic cardiomyopathy: Secondary | ICD-10-CM

## 2017-08-27 NOTE — Progress Notes (Signed)
Remote ICD transmission.   

## 2017-08-29 ENCOUNTER — Ambulatory Visit (INDEPENDENT_AMBULATORY_CARE_PROVIDER_SITE_OTHER): Payer: Medicare Other | Admitting: Cardiology

## 2017-08-29 ENCOUNTER — Encounter: Payer: Self-pay | Admitting: Cardiology

## 2017-08-29 VITALS — BP 113/69 | HR 75 | Temp 98.9°F | Ht 70.5 in | Wt 279.0 lb

## 2017-08-29 DIAGNOSIS — I350 Nonrheumatic aortic (valve) stenosis: Secondary | ICD-10-CM

## 2017-08-29 DIAGNOSIS — I209 Angina pectoris, unspecified: Secondary | ICD-10-CM | POA: Diagnosis not present

## 2017-08-29 DIAGNOSIS — I10 Essential (primary) hypertension: Secondary | ICD-10-CM | POA: Diagnosis not present

## 2017-08-29 DIAGNOSIS — I5022 Chronic systolic (congestive) heart failure: Secondary | ICD-10-CM | POA: Diagnosis not present

## 2017-08-29 DIAGNOSIS — I25118 Atherosclerotic heart disease of native coronary artery with other forms of angina pectoris: Secondary | ICD-10-CM | POA: Diagnosis not present

## 2017-08-29 LAB — CUP PACEART REMOTE DEVICE CHECK
Battery Remaining Longevity: 132 mo
HighPow Impedance: 90 Ohm
Implantable Lead Implant Date: 20170921
Implantable Lead Location: 753860
Implantable Pulse Generator Implant Date: 20170921
Lead Channel Pacing Threshold Amplitude: 0.875 V
Lead Channel Pacing Threshold Pulse Width: 0.4 ms
Lead Channel Sensing Intrinsic Amplitude: 5.5 mV
Lead Channel Setting Pacing Pulse Width: 0.4 ms
MDC IDC MSMT BATTERY VOLTAGE: 3.02 V
MDC IDC MSMT LEADCHNL RV IMPEDANCE VALUE: 399 Ohm
MDC IDC MSMT LEADCHNL RV IMPEDANCE VALUE: 437 Ohm
MDC IDC MSMT LEADCHNL RV SENSING INTR AMPL: 5.5 mV
MDC IDC SESS DTM: 20181231113523
MDC IDC SET LEADCHNL RV PACING AMPLITUDE: 2.5 V
MDC IDC SET LEADCHNL RV SENSING SENSITIVITY: 0.3 mV
MDC IDC STAT BRADY RV PERCENT PACED: 0.01 %

## 2017-08-29 MED ORDER — NITROGLYCERIN 0.4 MG SL SUBL: SUBLINGUAL_TABLET | SUBLINGUAL | 6 refills | Status: DC

## 2017-08-29 NOTE — Progress Notes (Signed)
Clinical Summary Mr. Lona is a 67 y.o.male seen today for follow up of the following medical problems.   1. Chronic systolic HF - history of ICM, LVEF 10-15% by echo Jan 2018. Restricitive diastolic dysfunction.  - ICD followed by EP - admit Jan 2018 with acute on chronic CHF. Diuresed nearly 15 lbs. Discharge weight 297 lbs. From pcp note 10/13/16 down to 279 lbs.     - home weights are stable around 277 lbs - compliant with meds. No recent symptoms.   2. CAD History of CABG in 2001(LIMA to distal LAD, SVG to RI, SVG to OM 2, sequential SVG to our PLA, SVG to acute marginal, SVG to posterior descending artery  - recent pleuritic chest pain related to recent URI, worst with position and cough. . No cardiac like chest pain.   3. HTN -he remains compliant with meds  4. Hyperlipidemia - compliant with meds  5. OSA - compliant with CPAP  6. Aortic stenosis - moderate by echo Jan 2018.  - no recent SOB/DOE,/chest pain/syncope   Past Medical History:  Diagnosis Date  . AICD (automatic cardioverter/defibrillator) present 05/18/2016  . Anginal pain ([place])    occ none recent  . Arthritis   . CAD (coronary artery disease)   . Cardiomyopathy, ischemic   . CHF (congestive heart failure) ([place])   . Chronic back pain   . Diabetes mellitus without complication (HCC)    prediabetes  . Dyspnea    rarely  . Elevated PSA   . History of kidney stones   . Hyperlipidemia   . Morbid obesity ([place])   . Myocardial infarction ([place])   . OSA (obstructive sleep apnea)    does not wear CPAP, -order run out told need to redo  test     Allergies  Allergen Reactions  . Dyflex-G [Dyphylline-Guaifenesin] Hives     Current Outpatient Medications  Medication Sig Dispense Refill  . carvedilol (COREG) 6.25 MG tablet Take 1 tablet (6.25 mg total) by mouth 2 (two) times daily with a meal. 60 tablet 6  . clopidogrel (PLAVIX) 75 MG tablet Take 1 tablet (75 mg total) by mouth  daily. 90 tablet 3  . HYDROcodone-acetaminophen (NORCO) 10-325 MG tablet 1 bid prn pain 60 tablet 0  . insulin aspart (NOVOLOG FLEXPEN) 100 UNIT/ML FlexPen Inject 6 units into skin with each meal. May titrate up to 14 units. (Patient taking differently: Inject 30 Units into the skin 2 (two) times daily. ) 15 mL 5  . Insulin Detemir (LEVEMIR) 100 UNIT/ML Pen Inject 12 units into skin each evening. May titrate up to 60 units. (Patient taking differently: Inject 60 Units into the skin at bedtime. ) 15 mL 5  . metolazone (ZAROXOLYN) 2.5 MG tablet Take 2.5 mg by mouth every 7 (seven) days. Friday    . Multiple Vitamins-Minerals (CENTRUM SILVER PO) Take 1 tablet by mouth daily.     . nitroGLYCERIN (NITROSTAT) 0.4 MG SL tablet PLACE 1 TABLET UNDER TONGUE FOR CHEST PAIN. MAY REPEAT EVERY 5 MIN UPTO 3 DOSES-NO RELIEF,CALL 911. 25 tablet 6  . OVER THE COUNTER MEDICATION Place 1 drop into both eyes daily. "dry eyes"    . polycarbophil (FIBERCON) 625 MG tablet Take 2 tablets (1,250 mg total) by mouth daily with lunch. Hold if you have diarrhea (Patient taking differently: Take 1,250 mg by mouth 3 (three) times daily. Hold if you have diarrhea) 30 tablet 0  . potassium chloride SA (K-DUR,KLOR-CON) 20 MEQ tablet  Take 2 tablets (40 mEq total) by mouth 2 (two) times daily. 360 tablet 3  . rosuvastatin (CRESTOR) 40 MG tablet Take 1 tablet (40 mg total) by mouth daily. 90 tablet 2  . torsemide (DEMADEX) 20 MG tablet TAKE (2) TABLETS BY MOUTH TWICE DAILY. (Patient taking differently: Take 40 mg by mouth bid) 180 tablet 0  . trolamine salicylate (ASPERCREME) 10 % cream Apply 1 application topically as needed for muscle pain.     No current facility-administered medications for this visit.      Past Surgical History:  Procedure Laterality Date  . APPENDECTOMY    . CARDIAC CATHETERIZATION     stents  . CAROTID STENT     denies carotid stent  . CATARACT EXTRACTION, BILATERAL    . CORONARY ARTERY BYPASS GRAFT   2001  . EP IMPLANTABLE DEVICE N/A 05/18/2016   Procedure: ICD Implant;  Surgeon: Will Meredith Leeds, MD;  Location: [facility];  Service: Cardiovascular;  Laterality: N/A;  . EYE SURGERY    . ingrown toenail N/A   . LEFT HEART CATHETERIZATION WITH CORONARY/GRAFT ANGIOGRAM N/A 12/21/2014   Procedure: LEFT HEART CATHETERIZATION WITH Beatrix Fetters;  Surgeon: Belva Crome, MD;  Location[facility] CATH LAB;  Service: Cardiovascular;  Laterality: N/A;  . NASAL SEPTOPLASTY W/ TURBINOPLASTY Bilateral 04/25/2017   NASAL SEPTOPLASTY WITH TURBINATE REDUCTION/notes 04/25/2017  . NASAL SEPTOPLASTY W/ TURBINOPLASTY Bilateral 04/25/2017   Procedure: NASAL SEPTOPLASTY WITH TURBINATE REDUCTION;  Surgeon: Leta Baptist, MD;  Location: [facility];  Service: ENT;  Laterality: Bilateral;  . TONSILLECTOMY       Allergies  Allergen Reactions  . Dyflex-G [Dyphylline-Guaifenesin] Hives      Family History  Adopted: Yes  Problem Relation Age of Onset  . Diabetes Mother   . CAD Mother      Social History Mr. Slocumb reports that he quit smoking about 18 years ago. he has never used smokeless tobacco. Mr. Whitham reports that he does not drink alcohol.   Review of Systems CONSTITUTIONAL: No weight loss, fever, chills, weakness or fatigue.  HEENT: Eyes: No visual loss, blurred vision, double vision or yellow sclerae.No hearing loss, sneezing, congestion, runny nose or sore throat.  SKIN: No rash or itching.  CARDIOVASCULAR: per hpi RESPIRATORY: No shortness of breath, cough or sputum.  GASTROINTESTINAL: No anorexia, nausea, vomiting or diarrhea. No abdominal pain or blood.  GENITOURINARY: No burning on urination, no polyuria NEUROLOGICAL: No headache, dizziness, syncope, paralysis, ataxia, numbness or tingling in the extremities. No change in bowel or bladder control.  MUSCULOSKELETAL: No muscle, back pain, joint pain or stiffness.  LYMPHATICS: No enlarged nodes. No history of splenectomy.    PSYCHIATRIC: No history of depression or anxiety.  ENDOCRINOLOGIC: No reports of sweating, cold or heat intolerance. No polyuria or polydipsia.  Marland Kitchen   Physical Examination Vitals:   08/29/17 0845  BP: 113/69  Pulse: 75  Temp: 98.9 F (37.2 C)  SpO2: 93%   Vitals:   08/29/17 0845  Weight: 279 lb (126.6 kg)  Height: 5' 10.5" (1.791 m)    Gen: resting comfortably, no acute distress HEENT: no scleral icterus, pupils equal round and reactive, no palptable cervical adenopathy,  CV: RRR, 3/6 systolic murmur rusb, no jvd Resp: Clear to auscultation bilaterally GI: abdomen is soft, non-tender, non-distended, normal bowel sounds, no hepatosplenomegaly MSK: extremities are warm, no edema.  Skin: warm, no rash Neuro:  no focal deficits Psych: appropriate affect   Diagnostic Studies Jan 2018 echo Study Conclusions  -  Left ventricle: The cavity size was normal. Wall thickness was normal. The estimated ejection fraction was in the range of 10% to 15%. Diffuse hypokinesis. Doppler parameters are consistent with restrictive physiology, indicative of decreased left ventricular diastolic compliance and/or increased left atrial pressure. Doppler parameters are consistent with high ventricular filling pressure. - Aortic valve: Moderately calcified annulus. Moderately thickened leaflets. There was moderate stenosis. AV gradients decreased in the setting of severe LV systolic dysfunction. AVA VTI and dimensionless index support moderate aortic stenosis. Mean gradient (S): 17 mm Hg. Valve area (VTI): 1.2 cm^2. Valve area (Vmax): 1.1 cm^2. - Left atrium: The atrium was severely dilated. - Pulmonary arteries: Systolic pressure was moderately increased. PA peak pressure: 58 mm Hg (S). - Inferior vena cava: The vessel was dilated. The respirophasic diameter changes were blunted (<50%), consistent with elevated central venous pressure. - Technically difficult  study. Echocontrast was used to enhance visualization.    Assessment and Plan   1. Chronic systolic HF - euvolemic with stable weights and no symptoms - continue current meds   2. CAD No recent chest pain, continue curren tmeds  3. HTN -at goal, continue current meds  4. Hyperlipidemia - he will continue current meds   5. Aortic stenosis - moderate by most recent echo. - repeat echo for continued surveillance.      Arnoldo Lenis, M.D.

## 2017-08-29 NOTE — Patient Instructions (Signed)
Medication Instructions:  Your physician recommends that you continue on your current medications as directed. Please refer to the Current Medication list given to you today.   Labwork: NONE  Testing/Procedures: Your physician has requested that you have an echocardiogram. Echocardiography is a painless test that uses sound waves to create images of your heart. It provides your doctor with information about the size and shape of your heart and how well your heart's chambers and valves are working. This procedure takes approximately one hour. There are no restrictions for this procedure.    Follow-Up: Your physician recommends that you schedule a follow-up appointment in: 4 MONTHS    Any Other Special Instructions Will Be Listed Below (If Applicable).     If you need a refill on your cardiac medications before your next appointment, please call your pharmacy.

## 2017-08-30 ENCOUNTER — Encounter (HOSPITAL_COMMUNITY): Payer: Self-pay | Admitting: *Deleted

## 2017-08-30 ENCOUNTER — Other Ambulatory Visit (HOSPITAL_COMMUNITY)
Admission: RE | Admit: 2017-08-30 | Discharge: 2017-08-30 | Disposition: A | Discharge status: Home / Self Care | Payer: Medicare Other | Source: Ambulatory Visit | Attending: Family Medicine | Admitting: Family Medicine

## 2017-08-30 ENCOUNTER — Emergency Department (HOSPITAL_COMMUNITY)
Admission: EM | Admit: 2017-08-30 | Discharge: 2017-08-31 | Disposition: A | Discharge status: Home / Self Care | Payer: Medicare Other | Attending: Emergency Medicine | Admitting: Emergency Medicine

## 2017-08-30 ENCOUNTER — Ambulatory Visit (INDEPENDENT_AMBULATORY_CARE_PROVIDER_SITE_OTHER): Payer: Medicare Other | Admitting: Family Medicine

## 2017-08-30 ENCOUNTER — Other Ambulatory Visit: Payer: Self-pay

## 2017-08-30 ENCOUNTER — Ambulatory Visit (HOSPITAL_COMMUNITY)
Admission: RE | Admit: 2017-08-30 | Discharge: 2017-08-30 | Disposition: A | Discharge status: Home / Self Care | Payer: Medicare Other | Source: Ambulatory Visit | Attending: Family Medicine | Admitting: Family Medicine

## 2017-08-30 ENCOUNTER — Encounter: Payer: Self-pay | Admitting: Family Medicine

## 2017-08-30 VITALS — BP 134/86 | Ht 70.5 in | Wt 278.4 lb

## 2017-08-30 DIAGNOSIS — R9431 Abnormal electrocardiogram [ECG] [EKG]: Secondary | ICD-10-CM

## 2017-08-30 DIAGNOSIS — J209 Acute bronchitis, unspecified: Secondary | ICD-10-CM

## 2017-08-30 DIAGNOSIS — I130 Hypertensive heart and chronic kidney disease with heart failure and stage 1 through stage 4 chronic kidney disease, or unspecified chronic kidney disease: Secondary | ICD-10-CM | POA: Diagnosis not present

## 2017-08-30 DIAGNOSIS — G8929 Other chronic pain: Secondary | ICD-10-CM | POA: Diagnosis not present

## 2017-08-30 DIAGNOSIS — M545 Low back pain: Secondary | ICD-10-CM | POA: Diagnosis not present

## 2017-08-30 DIAGNOSIS — E876 Hypokalemia: Secondary | ICD-10-CM

## 2017-08-30 DIAGNOSIS — I517 Cardiomegaly: Secondary | ICD-10-CM | POA: Insufficient documentation

## 2017-08-30 DIAGNOSIS — R05 Cough: Secondary | ICD-10-CM | POA: Diagnosis not present

## 2017-08-30 DIAGNOSIS — R799 Abnormal finding of blood chemistry, unspecified: Secondary | ICD-10-CM | POA: Diagnosis present

## 2017-08-30 DIAGNOSIS — J019 Acute sinusitis, unspecified: Secondary | ICD-10-CM | POA: Diagnosis not present

## 2017-08-30 DIAGNOSIS — I2510 Atherosclerotic heart disease of native coronary artery without angina pectoris: Secondary | ICD-10-CM | POA: Diagnosis not present

## 2017-08-30 DIAGNOSIS — E1122 Type 2 diabetes mellitus with diabetic chronic kidney disease: Secondary | ICD-10-CM | POA: Insufficient documentation

## 2017-08-30 DIAGNOSIS — I509 Heart failure, unspecified: Secondary | ICD-10-CM | POA: Diagnosis not present

## 2017-08-30 DIAGNOSIS — Z87891 Personal history of nicotine dependence: Secondary | ICD-10-CM | POA: Insufficient documentation

## 2017-08-30 DIAGNOSIS — N183 Chronic kidney disease, stage 3 (moderate): Secondary | ICD-10-CM | POA: Diagnosis not present

## 2017-08-30 LAB — BASIC METABOLIC PANEL
ANION GAP: 16 — AB (ref 5–15)
Anion gap: 16 — ABNORMAL HIGH (ref 5–15)
BUN: 38 mg/dL — AB (ref 6–20)
BUN: 41 mg/dL — AB (ref 6–20)
CO2: 39 mmol/L — ABNORMAL HIGH (ref 22–32)
CO2: 39 mmol/L — ABNORMAL HIGH (ref 22–32)
CREATININE: 1.34 mg/dL — AB (ref 0.61–1.24)
Calcium: 8.8 mg/dL — ABNORMAL LOW (ref 8.9–10.3)
Calcium: 8.5 mg/dL — ABNORMAL LOW (ref 8.9–10.3)
Chloride: 81 mmol/L — ABNORMAL LOW (ref 101–111)
Chloride: 81 mmol/L — ABNORMAL LOW (ref 101–111)
Creatinine, Ser: 1.38 mg/dL — ABNORMAL HIGH (ref 0.61–1.24)
GFR calc Af Amer: >60 mL/min (ref >60)
GFR calc Af Amer: 60 mL/min — ABNORMAL LOW (ref >60)
GFR, EST NON AFRICAN AMERICAN: 54 mL/min — AB (ref >60)
GFR, EST NON AFRICAN AMERICAN: 52 mL/min — AB (ref >60)
Glucose, Bld: 183 mg/dL — ABNORMAL HIGH (ref 65–99)
Glucose, Bld: 163 mg/dL — ABNORMAL HIGH (ref 65–99)
POTASSIUM: 2.2 mmol/L — AB (ref 3.5–5.1)
Potassium: 2.6 mmol/L — CL (ref 3.5–5.1)
SODIUM: 136 mmol/L (ref 135–145)
SODIUM: 136 mmol/L (ref 135–145)

## 2017-08-30 LAB — CBC WITH DIFFERENTIAL/PLATELET
BASOS ABS: 0 10*3/uL (ref 0.0–0.1)
BASOS PCT: 0 %
EOS ABS: 0.1 10*3/uL (ref 0.0–0.7)
EOS PCT: 1 %
HCT: 45.8 % (ref 39.0–52.0)
Hemoglobin: 15.8 g/dL (ref 13.0–17.0)
Lymphocytes Relative: 6 %
Lymphs Abs: 0.8 10*3/uL (ref 0.7–4.0)
MCH: 31.2 pg (ref 26.0–34.0)
MCHC: 34.5 g/dL (ref 30.0–36.0)
MCV: 90.5 fL (ref 78.0–100.0)
Monocytes Absolute: 2 10*3/uL — ABNORMAL HIGH (ref 0.1–1.0)
Monocytes Relative: 14 %
Neutro Abs: 10.9 10*3/uL (ref 1.7–7.7)
Neutrophils Relative %: 79 %
PLATELETS: 256 10*3/uL (ref 150–400)
RBC: 5.06 MIL/uL (ref 4.22–5.81)
RDW: 14.7 % (ref 11.5–15.5)
WBC: 13.8 10*3/uL — AB (ref 4.0–10.5)

## 2017-08-30 MED ORDER — AMOXICILLIN-POT CLAVULANATE 875-125 MG PO TABS: 1.0000 | ORAL_TABLET | Freq: Two times a day (BID) | ORAL | 0 refills | Status: DC

## 2017-08-30 MED ORDER — MAGNESIUM SULFATE 2 GM/50ML IV SOLN
2.0000 g | Freq: Once | INTRAVENOUS | Status: AC
  Administered 2017-08-30: 2 g via INTRAVENOUS
  Filled 2017-08-30: qty 50

## 2017-08-30 MED ORDER — HYDROCODONE-ACETAMINOPHEN 10-325 MG PO TABS: ORAL_TABLET | ORAL | 0 refills | Status: DC

## 2017-08-30 MED ORDER — CEFTRIAXONE SODIUM 1 G IJ SOLR
500.0000 mg | Freq: Once | INTRAMUSCULAR | Status: AC
  Administered 2017-08-30: 500 mg via INTRAMUSCULAR

## 2017-08-30 MED ORDER — POTASSIUM CHLORIDE 10 MEQ/100ML IV SOLN
10.0000 meq | INTRAVENOUS | Status: AC
  Administered 2017-08-30 (×2): 10 meq via INTRAVENOUS
  Filled 2017-08-30 (×2): qty 100

## 2017-08-30 MED ORDER — POTASSIUM CHLORIDE CRYS ER 20 MEQ PO TBCR
40.0000 meq | EXTENDED_RELEASE_TABLET | Freq: Once | ORAL | Status: AC
  Administered 2017-08-30: 40 meq via ORAL
  Filled 2017-08-30: qty 2

## 2017-08-30 NOTE — ED Provider Notes (Signed)
Emergency Department Provider Note   I have reviewed the triage vital signs and the nursing notes.   HISTORY  Chief Complaint Abnormal Lab   HPI [name] [name] is a 67 y.o. male with multiple medical problems as documented below that recently decreased amount of potassium he is on at the request of his doctor.  Today he was at the doctor's office for a follow-up visit and found to have a low potassium (2.6) and was sent here for further evaluation.  Patient is without complaints.  No syncope, chest pain, dizziness or lightheadedness.  History of prolonged QT interval.  Unsure when this started. No other associated or modifying symptoms.    Past Medical History:  Diagnosis Date  . AICD (automatic cardioverter/defibrillator) present 05/18/2016  . Anginal pain ([place])    occ none recent  . Arthritis   . CAD (coronary artery disease)   . Cardiomyopathy, ischemic   . CHF (congestive heart failure) ([place])   . Chronic back pain   . Diabetes mellitus without complication (HCC)    prediabetes  . Dyspnea    rarely  . Elevated PSA   . History of kidney stones   . Hyperlipidemia   . Morbid obesity ([place])   . Myocardial infarction ([place])   . OSA (obstructive sleep apnea)    does not wear CPAP, -order run out told need to redo  test    Patient Active Problem List   Diagnosis Date Noted  . S/P nasal septoplasty 04/25/2017  . AICD (automatic cardioverter/defibrillator) present 11/22/2016  . CKD (chronic kidney disease) stage 3, GFR 30-59 ml/min (HCC)   . Acute kidney injury ([place])   . Difficulty in walking, not elsewhere classified   . Pain in the abdomen   . Anasarca   . Constipation   . Dyspnea 09/11/2016  . Prolonged Q-T interval on ECG 09/11/2016  . Hypokalemia 09/11/2016  . Chest pain 10/22/2015  . Diabetes mellitus, type 2 ([place]) 10/22/2015  . Acute on chronic congestive heart failure ([place])   . Essential hypertension   . Coronary artery disease due to lipid rich plaque     . Chronic lumbar pain 01/01/2015  . Obstructive sleep apnea 10/02/2014  . Cardiomyopathy, ischemic 06/25/2014  . Congestive heart failure ([place]) 10/29/2013  . Right ankle pain 08/14/2013  . Hyperlipemia 05/14/2013  . CAD (coronary artery disease) of artery bypass graft 05/14/2013  . Morbid obesity ([place]) 05/14/2013  . Elevated PSA 05/14/2013    Past Surgical History:  Procedure Laterality Date  . APPENDECTOMY    . CARDIAC CATHETERIZATION     stents  . CAROTID STENT     denies carotid stent  . CATARACT EXTRACTION, BILATERAL    . CORONARY ARTERY BYPASS GRAFT  2001  . EP IMPLANTABLE DEVICE N/A 05/18/2016   Procedure: ICD Implant;  Surgeon: Will Meredith Leeds, MD;  Location: [facility];  Service: Cardiovascular;  Laterality: N/A;  . EYE SURGERY    . ingrown toenail N/A   . LEFT HEART CATHETERIZATION WITH CORONARY/GRAFT ANGIOGRAM N/A 12/21/2014   Procedure: LEFT HEART CATHETERIZATION WITH Beatrix Fetters;  Surgeon: Belva Crome, MD;  Location: [facility];  Service: Cardiovascular;  Laterality: N/A;  . NASAL SEPTOPLASTY W/ TURBINOPLASTY Bilateral 04/25/2017   NASAL SEPTOPLASTY WITH TURBINATE REDUCTION/notes 04/25/2017  . NASAL SEPTOPLASTY W/ TURBINOPLASTY Bilateral 04/25/2017   Procedure: NASAL SEPTOPLASTY WITH TURBINATE REDUCTION;  Surgeon: Leta Baptist, MD;  Location: [facility];  Service: ENT;  Laterality: Bilateral;  .  TONSILLECTOMY      Current Outpatient Rx  . Order #: [id number] Class: Normal  . Order #: [id number] Class: Normal  . Order #: [id number] Class: Normal  . Order #: [id number] Class: Print  . Order #: [id number] Class: Normal  . Order #: [id number] Class: Normal  . Order #: [id number] Class: Historical Med  . Order #: [id number] Class: Historical Med  . Order #: [id number] Class: Normal  . Order #: [id number] Class: Historical Med  . Order #: [id number] Class: Print  . Order #: [id number] Class: Normal  . Order #: [id number] Class: Normal  . Order #: [id number] Class: Normal  .  Order #: [id number] Class: Historical Med    Allergies Dyflex-g [dyphylline-guaifenesin]  Family History  Adopted: Yes  Problem Relation Age of Onset  . Diabetes Mother   . CAD Mother     Social History Social History   Tobacco Use  . Smoking status: Former Smoker    Last attempt to quit: 08/29/1999    Years since quitting: 18.0  . Smokeless tobacco: Never Used  . Tobacco comment: couldn't remember date  Substance Use Topics  . Alcohol use: No    Alcohol/week: 0.0 oz  . Drug use: No    Review of Systems  All other systems negative except as documented in the HPI. All pertinent positives and negatives as reviewed in the HPI. ____________________________________________   PHYSICAL EXAM:  VITAL SIGNS: ED Triage Vitals  Enc Vitals Group     BP 08/30/17 2037 101/75     Pulse Rate 08/30/17 2037 80     Resp 08/30/17 2037 20     Temp 08/30/17 2037 98.3 F (36.8 C)     Temp Source 08/30/17 2037 Oral     SpO2 08/30/17 2037 93 %     Weight 08/30/17 2037 277 lb (125.6 kg)     Height 08/30/17 2037 5' 10.5" (1.791 m)    Constitutional: Alert and oriented. Well appearing and in no acute distress. Eyes: Conjunctivae are normal. PERRL. EOMI. Head: Atraumatic. Nose: No congestion/rhinnorhea. Mouth/Throat: Mucous membranes are moist.  Oropharynx non-erythematous. Neck: No stridor.  No meningeal signs.   Cardiovascular: Normal rate, regular rhythm. Good peripheral circulation. Grossly normal heart sounds.   Respiratory: Normal respiratory effort.  No retractions. Lungs CTAB. Gastrointestinal: Soft and nontender. No distention.  Musculoskeletal: No lower extremity tenderness nor edema. No gross deformities of extremities. Neurologic:  Normal speech and language. No gross focal neurologic deficits are appreciated.  Skin:  Skin is warm, dry and intact. No rash noted.   ____________________________________________   LABS (all labs ordered are listed, but only abnormal results  are displayed)  Labs Reviewed  BASIC METABOLIC PANEL - Abnormal; Notable for the following components:      Result Value   Potassium 2.2 (*)    Chloride 81 (*)    CO2 39 (*)    Glucose, Bld 163 (*)    BUN 41 (*)    Creatinine, Ser 1.38 (*)    Calcium 8.5 (*)    GFR calc non Af Amer 52 (*)    GFR calc Af Amer 60 (*)    Anion gap 16 (*)    All other components within normal limits   ____________________________________________  EKG  My ECG Read Indication:hypokalemia EKG was personally contemporaneously reviewed by myself. Rate: 78 PR Interval: 206 QRS duration: 108 QT/QTC: 454/517 Axis: Right EKG: unchanged from previous tracings. Other significant findings: prolonged qt not new   _______________________________  RADIOLOGY  Dg Chest 2 View  Result Date: 08/30/2017 CLINICAL DATA:  Congestion,  cough EXAM: CHEST  2 VIEW COMPARISON:  09/11/2016 FINDINGS: Left AICD remains in place, unchanged. Prior CABG. Cardiomegaly. No confluent airspace opacities or effusions. No acute bony abnormality. IMPRESSION: Cardiomegaly.  No acute findings. Electronically Signed   By: Rolm Baptise M.D.   On: 08/30/2017 12:10   ____________________________________________   INITIAL IMPRESSION / ASSESSMENT AND PLAN / ED COURSE  I discussed the patient with Dr. Wolfgang Phoenix on the phone who will see the patient tomorrow for follow-up, and will call him on the phone for the same.  Plan to give him some potassium and magnesium here.  He will increase his potassium at home back to 3 times a day until told otherwise.  Repeat EKG to make sure his QT is the same or improving.  Check out to Dr. Christy Gentles pending completion of potassium, magnesium and ECG.   Pertinent labs & imaging results that were available during my care of the patient were reviewed by me and considered in my medical decision making (see chart for details).  ____________________________________________  FINAL CLINICAL IMPRESSION(S) / ED  DIAGNOSES  Final diagnoses:  Hypokalemia  Prolonged Q-T interval on ECG     MEDICATIONS GIVEN DURING THIS VISIT:  Medications  potassium chloride 10 mEq in 100 mL IVPB (10 mEq Intravenous New Bag/Given 08/30/17 2351)  potassium chloride SA (K-DUR,KLOR-CON) CR tablet 40 mEq (40 mEq Oral Given 08/30/17 2243)  magnesium sulfate IVPB 2 g 50 mL (0 g Intravenous Stopped 08/30/17 2351)     NEW OUTPATIENT MEDICATIONS STARTED DURING THIS VISIT:  This SmartLink is deprecated. Use AVSMEDLIST instead to display the medication list for a patient.  Note:  This note was prepared with assistance of Dragon voice recognition software. Occasional wrong-word or sound-a-like substitutions may have occurred due to the inherent limitations of voice recognition software.   Darren Nodal, Corene Cornea, MD 08/31/17 [phone]

## 2017-08-30 NOTE — ED Notes (Signed)
Date and time results received: 08/30/17 2345 (use smartphrase ".now" to insert current time)  Test: Potassium Critical Value: 2.2  Name of Provider Notified: [place]  Orders Received? Or Actions Taken?:

## 2017-08-30 NOTE — Progress Notes (Addendum)
   Subjective:    Patient ID: [name] [name], male    DOB: 1950-09-28, 67 y.o.   MRN: [id number]  HPI Patient arrives to discuss arthritis and current pain control. He relates that he has difficulty with left hand getting stiff at times with trigger finger in the middle finger it does not hit his right hand.  He has CHF followed by cardiology they are doing a echo later next week apparently it has been stable but he has severe CHF  In addition to this patient relates head congestion drainage coughing denies any high fever chills wheezing but states he feels very rundown.  Has a very coarse cough.  Review of Systems  Constitutional: Negative for activity change and fever.  HENT: Positive for congestion and rhinorrhea. Negative for ear pain.   Eyes: Negative for discharge.  Respiratory: Positive for cough. Negative for wheezing.   Cardiovascular: Negative for chest pain.       Objective:   Physical Exam  Constitutional: He appears well-developed.  HENT:  Head: Normocephalic.  Mouth/Throat: Oropharynx is clear and moist. No oropharyngeal exudate.  Neck: Normal range of motion.  Cardiovascular: Normal rate, regular rhythm and normal heart sounds.  No murmur heard. Pulmonary/Chest: Effort normal and breath sounds normal. He has no wheezes.  Lymphadenopathy:    He has no cervical adenopathy.  Neurological: He exhibits normal muscle tone.  Skin: Skin is warm and dry.  Nursing note and vitals reviewed.    25 minutes was spent with the patient. Greater than half the time was spent in discussion and answering questions and counseling regarding the issues that the patient came in for today.      Assessment & Plan:  Left hand arthralgia with trigger finger patient defers on orthopedic referral at the moment he will consider this currently right now he is dealing with sickness would like to get that better  Acute sinusitis and bronchitis possible pneumonia stat x-rays and lab work  indicated await results patient not dyspnea-shot of antibiotics and antibiotics prescribed  The patient was seen today as part of a comprehensive visit regarding pain control. Patient's compliance with the medication as well as discussion regarding effectiveness was completed. Prescriptions were written. Patient was advised to follow-up in 3 months. The patient was assessed for any signs of severe side effects. The patient was advised to take the medicine as directed and to report to [place] if any side effect issues.  It should be noted that the patient's chest x-ray came back with no sign of pneumonia white blood count slightly elevated potassium significantly low the patient was spoke with was sent to the ER for IV potassium we will provide close follow-up

## 2017-08-30 NOTE — ED Triage Notes (Signed)
Pt arrives, sent by dr. Wolfgang Phoenix for abnormal labs. Potassium is 2.6 on lab draw this morning from visit with him for eval of bronchitis/pneumonia.

## 2017-08-31 ENCOUNTER — Encounter: Payer: Self-pay | Admitting: Cardiology

## 2017-08-31 ENCOUNTER — Other Ambulatory Visit: Payer: Self-pay

## 2017-08-31 NOTE — ED Provider Notes (Signed)
Assumed care in sign out to follow patient after receiving IV magnesium and IV potassium. QT on his EKG is improved.  Plan will be to discharge home and he will follow-up with his PCP  ED ECG REPORT   Date: 08/31/2017 0120  Rate: 74  Rhythm: normal sinus rhythm  QRS Axis: normal  Intervals: normal  ST/T Wave abnormalities: normal  Conduction Disutrbances:nonspecific intraventricular conduction delay  Narrative Interpretation:   Old EKG Reviewed: changes noted  I have personally reviewed the EKG tracing and agree with the computerized printout as noted.    Ripley Fraise, MD 08/31/17 [phone]

## 2017-09-03 ENCOUNTER — Encounter: Payer: Self-pay | Admitting: Cardiology

## 2017-09-03 ENCOUNTER — Other Ambulatory Visit: Payer: Self-pay | Admitting: *Deleted

## 2017-09-03 DIAGNOSIS — E876 Hypokalemia: Secondary | ICD-10-CM

## 2017-09-07 ENCOUNTER — Ambulatory Visit (HOSPITAL_COMMUNITY)
Admission: RE | Admit: 2017-09-07 | Discharge: 2017-09-07 | Disposition: A | Discharge status: Home / Self Care | Payer: Medicare Other | Source: Ambulatory Visit | Attending: Cardiology | Admitting: Cardiology

## 2017-09-07 DIAGNOSIS — E785 Hyperlipidemia, unspecified: Secondary | ICD-10-CM | POA: Insufficient documentation

## 2017-09-07 DIAGNOSIS — E119 Type 2 diabetes mellitus without complications: Secondary | ICD-10-CM | POA: Diagnosis not present

## 2017-09-07 DIAGNOSIS — I2510 Atherosclerotic heart disease of native coronary artery without angina pectoris: Secondary | ICD-10-CM | POA: Diagnosis not present

## 2017-09-07 DIAGNOSIS — G4733 Obstructive sleep apnea (adult) (pediatric): Secondary | ICD-10-CM | POA: Diagnosis not present

## 2017-09-07 DIAGNOSIS — E876 Hypokalemia: Secondary | ICD-10-CM | POA: Diagnosis not present

## 2017-09-07 DIAGNOSIS — I255 Ischemic cardiomyopathy: Secondary | ICD-10-CM | POA: Insufficient documentation

## 2017-09-07 DIAGNOSIS — I110 Hypertensive heart disease with heart failure: Secondary | ICD-10-CM | POA: Insufficient documentation

## 2017-09-07 DIAGNOSIS — I509 Heart failure, unspecified: Secondary | ICD-10-CM | POA: Diagnosis not present

## 2017-09-07 DIAGNOSIS — I350 Nonrheumatic aortic (valve) stenosis: Secondary | ICD-10-CM | POA: Diagnosis not present

## 2017-09-07 LAB — ECHOCARDIOGRAM COMPLETE
AV Area VTI index: 0.27 cm2/m2
AV Mean grad: 26 mmHg
AV Peak grad: 42 mmHg
AV area mean vel ind: 0.29 cm2/m2
AV peak Index: 0.3
AV vel: 0.7
AVAREAMEANV: 0.73 cm2
AVAREAVTI: 0.77 cm2
AVCELMEANRAT: 0.19
AVLVOTPG: 2 mmHg
AVPKVEL: 323 cm/s
Ao pk vel: 0.2 m/s
CHL CUP AV VALUE AREA INDEX: 0.27
DOP CAL AO MEAN VELOCITY: 236 cm/s
E decel time: 173 msec
EERAT: 15.22
FS: 9 % — AB (ref 28–44)
IV/PV OW: 1.14
LA ID, A-P, ES: 51 mm
LA diam end sys: 51 mm
LA diam index: 2 cm/m2
LA vol A4C: 78.4 ml
LA vol index: 27.7 mL/m2
LAVOL: 70.7 mL
LV E/e' medial: 15.22
LV E/e'average: 15.22
LV PW d: 10.2 mm — AB (ref 0.6–1.1)
LV TDI E'MEDIAL: 4.38
LV e' LATERAL: 6.57 cm/s
LVOT SV: 54 mL
LVOT VTI: 14.2 cm
LVOT area: 3.8 cm2
LVOTD: 22 mm
LVOTPV: 65.8 cm/s
LVOTVTI: 0.18 cm
MV Dec: 173
MV Peak grad: 4 mmHg
MV pk E vel: 100 m/s
MVPKAVEL: 77.6 m/s
RV LATERAL S' VELOCITY: 8.6 cm/s
RV TAPSE: 15.3 mm
TDI e' lateral: 6.57
VTI: 77 cm
Valve area: 0.7 cm2

## 2017-09-07 MED ORDER — PERFLUTREN LIPID MICROSPHERE
1.0000 mL | INTRAVENOUS | Status: AC | PRN
  Administered 2017-09-07: 1 mL via INTRAVENOUS
  Administered 2017-09-07: 2 mL via INTRAVENOUS
  Filled 2017-09-07: qty 10

## 2017-09-07 NOTE — Progress Notes (Signed)
*  PRELIMINARY RESULTS* Echocardiogram 2D Echocardiogram has been performed with Def[facility].  Samuel Germany 09/07/2017, 10:25 AM

## 2017-09-08 LAB — BASIC METABOLIC PANEL
BUN/Creatinine Ratio: 22 (ref 10–24)
BUN: 24 mg/dL (ref 8–27)
CO2: 28 mmol/L (ref 20–29)
CREATININE: 1.07 mg/dL (ref 0.76–1.27)
Calcium: 10.8 mg/dL — ABNORMAL HIGH (ref 8.6–10.2)
Chloride: 102 mmol/L (ref 96–106)
GFR calc Af Amer: 83 mL/min/{1.73_m2} (ref >59)
GFR, EST NON AFRICAN AMERICAN: 72 mL/min/{1.73_m2} (ref >59)
GLUCOSE: 115 mg/dL — AB (ref 65–99)
Potassium: 5.3 mmol/L — ABNORMAL HIGH (ref 3.5–5.2)
SODIUM: 144 mmol/L (ref 134–144)

## 2017-09-11 MED ORDER — POTASSIUM CHLORIDE CRYS ER 20 MEQ PO TBCR: EXTENDED_RELEASE_TABLET | ORAL | 0 refills | Status: DC

## 2017-09-11 NOTE — Addendum Note (Signed)
Addended by: Dairl Ponder on: 09/11/2017 11:56 AM   Modules accepted: Orders

## 2017-09-14 ENCOUNTER — Telehealth: Payer: Self-pay

## 2017-09-14 DIAGNOSIS — R0602 Shortness of breath: Secondary | ICD-10-CM

## 2017-09-14 NOTE — Telephone Encounter (Signed)
Spoke with pt and wife. They both were made aware of results. Pt's wife asked to be referred to Dr. Wallace Going in [place]. I sent a staff message to Nell Range to schedule pt. With valve clinic.

## 2017-09-14 NOTE — Telephone Encounter (Signed)
-----   Message from Arnoldo Lenis, MD sent at 09/13/2017 12:28 PM EST ----- Echo shows heart function remains weak but stable. 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 Please refer patient to our valve clinic Dr Burt Knack or Dr Angelena Form for aortic stenosis, to discuss what further testing may be needed either dobutamine echo or cath, and discuss future options for likely valve replacement in the near future. Patient also needs a  Repeat echo with contrast one month, there is some suggestion of the very early formations of a blood clot in his heart and we need to reevalaute in 1 month  Zandra Abts MD

## 2017-09-15 ENCOUNTER — Other Ambulatory Visit: Payer: Self-pay | Admitting: Cardiology

## 2017-09-17 ENCOUNTER — Encounter: Payer: Self-pay | Admitting: Physician Assistant

## 2017-09-21 ENCOUNTER — Ambulatory Visit: Payer: Medicare Other | Admitting: Family Medicine

## 2017-09-27 ENCOUNTER — Encounter: Payer: Self-pay | Admitting: Cardiovascular Disease

## 2017-09-27 NOTE — Progress Notes (Addendum)
Cardiology Office Note Date:  10/02/2017   ID:  [name], DOB [birth year], MRN [id number]  PCP:  Kathyrn Drown, MD  Cardiologist:  Sherren Mocha, MD    Chief Complaint  Patient presents with  . Advice Only    TAVR  . Shortness of Breath  . Chest Pain     History of Present Illness: [name] [name] is a 67 y.o. male who presents for evaluation of progressive aortic stenosis, with findings concerning for severe low-flow low-gradient aortic stenosis  He has a hx of inferior MI in 2000 treated with primary PCI (bare metal stent) with recurrent restenosis requiring angioplasty. He ultimately underwent multivessel in 2001 by Dr Roxy Manns with a LIMA-LAD, SVG-ramus, SVG-OM2, and sequential graft to the right PLA, acute marginal, and PDA. His last cath in 2016 showed total occlusion of the LAD, circumflex, and RCA, with patency of the LIMA-LAD, patency of the SVG-acute marginal but occlusion of the PDA limb, moderate stenosis of the SVG-ramus proximal to the stent, and patency of the SVG-PLA. He has a longstanding severe ischemic cardiomyopathy with LVEF < 20%. He underwent ICD implantation in 2017.  He is here with his wife today. It is quite difficult to obtain a history from the patient as he is not very cooperative with answering questions. He has been told about TAVR and asks that we 'just order his tests and tell him whether he can have his valve fixed.' He repeatedly asks me to look in the chart when I ask him questions about his current symptoms.   He does admit to episodic chest pain requiring responsive to sublingual NTG. He has dyspnea with exertion. Denies orthopnea or PND. Appears to still be independent and able to perform his ADL's without assistance. He has had hospital admissions for heart failure, last in January 2018. His most recent echo showed an LVEF of 20% with diffuse LV hypokinesis, question of LV apical thrombus, moderate calcification of the aortic valve with bicuspid  appearance and peak and mean gradients of 42 and 26, respectively, with a dimensionless index of 0.18. Because of progressive findings of aortic stenosis he is referred for consideration of TAVR.   Past Medical History:  Diagnosis Date  . AICD (automatic cardioverter/defibrillator) present 05/18/2016  . Anginal pain ([place])    occ none recent  . Arthritis   . CAD (coronary artery disease)   . Cardiomyopathy, ischemic   . CHF (congestive heart failure) ([place])   . Chronic back pain   . Diabetes mellitus without complication (HCC)    prediabetes  . Elevated PSA   . History of kidney stones   . Hyperlipidemia   . Morbid obesity ([place])   . Myocardial infarction ([place])   . OSA (obstructive sleep apnea)    does not wear CPAP, -order run out told need to redo  test    Past Surgical History:  Procedure Laterality Date  . APPENDECTOMY    . CARDIAC CATHETERIZATION     stents  . CAROTID STENT     denies carotid stent  . CATARACT EXTRACTION, BILATERAL    . CORONARY ARTERY BYPASS GRAFT  2001  . EP IMPLANTABLE DEVICE N/A 05/18/2016   Procedure: ICD Implant;  Surgeon: Will Meredith Leeds, MD;  Location: [facility];  Service: Cardiovascular;  Laterality: N/A;  . EYE SURGERY    . ingrown toenail N/A   . LEFT HEART CATHETERIZATION WITH CORONARY/GRAFT ANGIOGRAM N/A 12/21/2014   Procedure: LEFT HEART CATHETERIZATION WITH  Beatrix Fetters;  Surgeon: Belva Crome, MD;  Location: [facility];  Service: Cardiovascular;  Laterality: N/A;  . NASAL SEPTOPLASTY W/ TURBINOPLASTY Bilateral 04/25/2017   NASAL SEPTOPLASTY WITH TURBINATE REDUCTION/notes 04/25/2017  . NASAL SEPTOPLASTY W/ TURBINOPLASTY Bilateral 04/25/2017   Procedure: NASAL SEPTOPLASTY WITH TURBINATE REDUCTION;  Surgeon: Leta Baptist, MD;  Location: [facility];  Service: ENT;  Laterality: Bilateral;  . TONSILLECTOMY      Current Outpatient Medications  Medication Sig Dispense Refill  . carvedilol (COREG) 6.25 MG tablet Take 1 tablet  (6.25 mg total) by mouth 2 (two) times daily with a meal. 60 tablet 6  . clopidogrel (PLAVIX) 75 MG tablet Take 1 tablet (75 mg total) by mouth daily. 90 tablet 3  . HYDROcodone-acetaminophen (NORCO) 10-325 MG tablet 1 bid prn pain (Patient taking differently: Take 2 tablets by mouth daily as needed for moderate pain. ) 60 tablet 0  . Insulin Detemir (LEVEMIR) 100 UNIT/ML Pen Inject 12 units into skin each evening. May titrate up to 60 units. (Patient taking differently: Inject 60 Units into the skin at bedtime. ) 15 mL 5  . metolazone (ZAROXOLYN) 2.5 MG tablet Take 2.5 mg by mouth See admin instructions. Take 2.5 mg by mouth once daily every 5 days    . Multiple Vitamins-Minerals (CENTRUM SILVER PO) Take 1 tablet by mouth daily.     . nitroGLYCERIN (NITROSTAT) 0.4 MG SL tablet PLACE 1 TABLET UNDER TONGUE FOR CHEST PAIN. MAY REPEAT EVERY 5 MIN UPTO 3 DOSES-NO RELIEF,CALL 911. (Patient taking differently: Place 0.4 mg under the tongue every 5 (five) minutes as needed for chest pain. UPTO 3 DOSES-NO RELIEF,CALL 911.) 25 tablet 6  . polycarbophil (FIBERCON) 625 MG tablet Take 2 tablets (1,250 mg total) by mouth daily with lunch. Hold if you have diarrhea (Patient taking differently: Take 1,250 mg by mouth 3 (three) times daily. Hold if you have diarrhea) 30 tablet 0  . potassium chloride SA (K-DUR,KLOR-CON) 20 MEQ tablet 2 tablets in the am, one tablet midday and 2 tablets qpm (Patient taking differently: Take 20-40 mEq by mouth See admin instructions. Take 40 meq by mouth in the morning, and take 20 meq by mouth midday and take 40 meq by mouth in the evening) 450 tablet 0  . rosuvastatin (CRESTOR) 40 MG tablet Take 1 tablet (40 mg total) by mouth daily. 90 tablet 2  . torsemide (DEMADEX) 20 MG tablet TAKE (2) TABLETS BY MOUTH TWICE DAILY. (Patient taking differently: TAKE 40 MG BY MOUTH TWICE DAILY.) 180 tablet 0  . trolamine salicylate (ASPERCREME) 10 % cream Apply 1 application topically 2 (two) times  daily as needed for muscle pain.     Marland Kitchen aspirin-acetaminophen-caffeine (EXCEDRIN MIGRAINE) 250-250-65 MG tablet Take 2-3 tablets by mouth every 6 (six) hours as needed for headache.    . liver oil-zinc oxide (DESITIN) 40 % ointment Apply 1 application topically as needed for irritation.    . Naphazoline HCl (CLEAR EYES OP) Place 2 drops into both eyes daily as needed (for dry eyes. Gel and PF).    . NOVOLOG FLEXPEN 100 UNIT/ML FlexPen INJECT 6 UNITS INTO THE SKIN WITH EACH MEAL. MAY TITRATE UP 14 UNITS. (Patient taking differently: Inject 20 units SQ twice daily after meals) 15 mL 0  . sodium chloride (OCEAN) 0.65 % SOLN nasal spray Place 2 sprays into both nostrils 2 (two) times daily as needed for congestion.     No current facility-administered medications for this visit.  Allergies:   Dyflex-g [dyphylline-guaifenesin]   Social History:  The patient  reports that he quit smoking about 18 years ago. he has never used smokeless tobacco. He reports that he does not drink alcohol or use drugs.   Family History:  The patient's family history includes CAD in his mother; Diabetes in his mother. He was adopted.    ROS:  Please see the history of present illness.  Otherwise, review of systems is positive for fatigue.  All other systems are reviewed and negative.    PHYSICAL EXAM: VS:  BP 124/80   Pulse 82   Ht 5' 10.5" (1.791 m)   Wt 287 lb (130.2 kg)   SpO2 99%   BMI 40.60 kg/m  , BMI Body mass index is 40.6 kg/m. GEN: obese male, in no acute distress  HEENT: normal  Neck: no JVD, no masses. No carotid bruits Cardiac: RRR with 2/6 harsh mid peaking systolic murmur at the RUSB, A2 audible            Respiratory:  clear to auscultation bilaterally, normal work of breathing GI: soft, nontender, nondistended, + BS MS: no deformity or atrophy  Ext: 1+ bilateral pretibial edema Skin: warm and dry, no rash Neuro:  Strength and sensation are intact Psych: euthymic mood, full  affect  EKG:  EKG is not ordered today.  Recent Labs: 01/15/2017: Magnesium 2.0 06/25/2017: ALT 11 09/28/2017: BUN 18; Creatinine, Ser 0.94; Hemoglobin 15.0; Platelets 193; Potassium 4.2; Sodium 139   Lipid Panel     Component Value Date/Time   CHOL 116 06/25/2017 1140   TRIG 143 06/25/2017 1140   HDL 35 (L) 06/25/2017 1140   CHOLHDL 3.3 06/25/2017 1140   CHOLHDL 3.9 01/02/2015 1010   VLDL 31 01/02/2015 1010   LDLCALC 52 06/25/2017 1140      Wt Readings from Last 3 Encounters:  09/28/17 287 lb (130.2 kg)  08/30/17 277 lb (125.6 kg)  08/30/17 278 lb 6.4 oz (126.3 kg)     Cardiac Studies Reviewed: Echo 09-07-2017: Impressions:  - Mild LVH with moderate chamber dilatation and LVEF approximately   20%. There is diffuse hypokinesis, most prominent in the   inferolateral wall. Grade 2 diastolic dysfunction. Probable   loosely organized thrombotic material at the LV apex based on   Definity contrast imaging. Mild to moderate mitral annular   calcification. Probable bicuspid aortic valve, moderately   calcified, and with evidence of potential severe aortic stenosis   based on dimensionless index with only modestly elevated   gradients. Device wire present within the right heart. Mildly   reduced right ventricular contraction. Trivial tricuspid   regurgitation.  ------------------------------------------------------------------- Study data:  Comparison was made to the study of 09/12/2016.  Study status:  Routine.  Procedure:  The patient reported no pain pre or post test. Transthoracic echocardiography. Image quality was adequate.  Study completion:  There were no complications. Transthoracic echocardiography.  M-mode, complete 2D, spectral Doppler, and color Doppler.  Birthdate:  Patient birthdate: 09-Aug-1951.  Age:  Patient is 67 yr old.  Sex:  Gender: male. BMI: 39.2 kg/m^2.  Blood pressure:     134/84  Patient status: Inpatient.  Study date:  Study date: 09/07/2017.  Study time: 08:37 AM.  Location:  Echo laboratory.  -------------------------------------------------------------------  ------------------------------------------------------------------- Left ventricle:  The cavity size was moderately dilated. Wall thickness was increased in a pattern of mild LVH. The estimated ejection fraction was 20%. Diffuse hypokinesis.  Regional wall motion abnormalities:   There is  severe hypokinesis of the inferolateral myocardium. Probable loosely organized thrombotic material at the LV apex based on Definity contrast imaging. Features are consistent with a pseudonormal left ventricular filling pattern, with concomitant abnormal relaxation and increased filling pressure (grade 2 diastolic dysfunction).  ------------------------------------------------------------------- Aortic valve:   Moderately calcified annulus. Possibly bicuspid; moderately calcified leaflets.  Doppler:  There was no significant regurgitation.    VTI ratio of LVOT to aortic valve: 0.18. Valve area (VTI): 0.7 cm^2. Indexed valve area (VTI): 0.27 cm^2/m^2. Peak velocity ratio of LVOT to aortic valve: 0.2. Valve area (Vmax): 0.77 cm^2. Indexed valve area (Vmax): 0.3 cm^2/m^2. Mean velocity ratio of LVOT to aortic valve: 0.19. Valve area (Vmean): 0.73 cm^2. Indexed valve area (Vmean): 0.29 cm^2/m^2.    Mean gradient (S): 26 mm Hg. Peak gradient (S): 42 mm Hg.  ------------------------------------------------------------------- Aorta:  Aortic root: The aortic root was normal in size.  ------------------------------------------------------------------- Mitral valve:   Mildly to moderately calcified annulus.  Doppler: There was no significant regurgitation.    Peak gradient (D): 4 mm Hg.  ------------------------------------------------------------------- Left atrium:  The atrium was normal in size.  ------------------------------------------------------------------- Atrial  septum:  No defect or patent foramen ovale was identified.   ------------------------------------------------------------------- Right ventricle:  The cavity size was normal. Pacer wire or catheter noted in right ventricle. Systolic function was mildly reduced.  ------------------------------------------------------------------- Pulmonic valve:    The valve appears to be grossly normal. Doppler:  There was no significant regurgitation.  ------------------------------------------------------------------- Tricuspid valve:   The valve appears to be grossly normal. Doppler:  There was trivial regurgitation.  ------------------------------------------------------------------- Pulmonary artery:    Systolic pressure could not be accurately estimated.  ------------------------------------------------------------------- Right atrium:  The atrium was normal in size. Pacer wire or catheter noted in right atrium.  ------------------------------------------------------------------- Pericardium:  There was no pericardial effusion.  ------------------------------------------------------------------- Systemic veins: Inferior vena cava: The vessel was normal in size. The respirophasic diameter changes were in the normal range (= 50%), consistent with normal central venous pressure.  STS Risk Calculator: Procedure: AVR + CAB  Risk of Mortality: 7.539%  Renal Failure: 5.610%  Permanent Stroke: 1.570%  Prolonged Ventilation: 34.707%  DSW Infection: 0.414%  Reoperation: 5.414%  Morbidity or Mortality: 43.063%  Short Length of Stay: 15.655%  Long Length of Stay: 17.385%   ASSESSMENT AND PLAN: 1.  Chronic systolic heart failure secondary to severe underlying ischemic cardiomyopathy with LVEF 10-15% 2.  Coronary artery disease with remote CABG in 2001 and subsequent PCI procedures with chronic angina 3.  Progressive degenerative aortic stenosis, now in the moderate range by echo  criteria, possible severe low flow low gradient aortic stenosis 4.  Morbid obesity 5. 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  I counseled the patient and his wife about the natural history of aortic stenosis, specifically in the context of his comorbid conditions including very severe LV dysfunction and chronic heart failure with severe ischemic heart disease.  At this point it is not clear to me whether he truly has severe aortic stenosis.  There is reasonably good mobility of the right coronary cusp with dense calcification of the noncoronary and left coronary cusps.  The patient's transvalvular gradients are moderately increased with peak and mean gradients of 42 and 26 mmHg and a dimensionless index less than 0.18. Past echo studies are reviewed and the mean aortic gradients have increased from 11 mmHg in 2017 to 17 mm Hg in 2018  to 26 mm Hg on the current study, all in the setting of very severe LV systolic dysfunction.   This was a difficult interaction today.  The patient was irritated that he had to provide any history and continued to ask me to just look at the chart.  He did not want to talk about any procedural risks of either cardiac catheterization or TAVR.  He asked that we just do the CT scan and cardiac cath studies to determine whether he was a candidate to have his valve fixed.  I explained to him that evaluation for TAVR necessitates multiple diagnostic tests and multidisciplinary consultation.  I tried to convey to him that his situation is quite complex because of his severe cardiomyopathy and other comorbid conditions and we need to determine first whether he in fact needs aortic valve replacement and secondly whether he is a candidate for such based on his LVEF of 10-15%.  He is willing to undergo necessary diagnostic tests which will include right and left heart  catheterization and CT angiography studies of the heart and peripheral vessels.  He will be referred for formal cardiac surgical evaluation once his studies are completed. I have reviewed the risks, indications, and alternatives to cardiac catheterization, possible angioplasty, and stenting with the patient. Risks include but are not limited to bleeding, infection, vascular injury, stroke, myocardial infection, arrhythmia, kidney injury, radiation-related injury in the case of prolonged fluoroscopy use, emergency cardiac surgery, and death. The patient understands the risks of serious complication is 1-2 in 2751 with diagnostic cardiac cath and 1-2% or less with angioplasty/stenting.   I don't appreciate clear signs of apical thrombus on review of his echo, but note plans to repeat an echo in one month with contrast. I am hopeful the his gated cardiac CTA will better define the LV apex. If he in fact has LV apical thrombus he will require a period of anticoagulation prior to undergoing TAVR.   Current medicines are reviewed with the patient today.  The patient does not have concerns regarding medicines.  Labs/ tests ordered today include:   Orders Placed This Encounter  Procedures  . Basic Metabolic Panel (BMET)  . CBC  . INR/PT    Disposition:   FU pending test results  Signed, Sherren Mocha, MD  10/02/2017 6:05 AM    [address], [place], [place]  [address] Phone: [phone]; Fax: [phone]

## 2017-09-27 NOTE — H&P (View-Only) (Signed)
Cardiology Office Note Date:  10/02/2017   ID:  [name], DOB 1951/08/14, MRN [id number]  PCP:  Kathyrn Drown, MD  Cardiologist:  Sherren Mocha, MD    Chief Complaint  Patient presents with  . Advice Only    TAVR  . Shortness of Breath  . Chest Pain     History of Present Illness: [name] [name] is a 67 y.o. male who presents for evaluation of progressive aortic stenosis, with findings concerning for severe low-flow low-gradient aortic stenosis  He has a hx of inferior MI in 2000 treated with primary PCI (bare metal stent) with recurrent restenosis requiring angioplasty. He ultimately underwent multivessel in 2001 by Dr Roxy Manns with a LIMA-LAD, SVG-ramus, SVG-OM2, and sequential graft to the right PLA, acute marginal, and PDA. His last cath in 2016 showed total occlusion of the LAD, circumflex, and RCA, with patency of the LIMA-LAD, patency of the SVG-acute marginal but occlusion of the PDA limb, moderate stenosis of the SVG-ramus proximal to the stent, and patency of the SVG-PLA. He has a longstanding severe ischemic cardiomyopathy with LVEF < 20%. He underwent ICD implantation in 2017.  He is here with his wife today. It is quite difficult to obtain a history from the patient as he is not very cooperative with answering questions. He has been told about TAVR and asks that we 'just order his tests and tell him whether he can have his valve fixed.' He repeatedly asks me to look in the chart when I ask him questions about his current symptoms.   He does admit to episodic chest pain requiring responsive to sublingual NTG. He has dyspnea with exertion. Denies orthopnea or PND. Appears to still be independent and able to perform his ADL's without assistance. He has had hospital admissions for heart failure, last in January 2018. His most recent echo showed an LVEF of 20% with diffuse LV hypokinesis, question of LV apical thrombus, moderate calcification of the aortic valve with bicuspid  appearance and peak and mean gradients of 42 and 26, respectively, with a dimensionless index of 0.18. Because of progressive findings of aortic stenosis he is referred for consideration of TAVR.   Past Medical History:  Diagnosis Date  . AICD (automatic cardioverter/defibrillator) present 05/18/2016  . Anginal pain ([place])    occ none recent  . Arthritis   . CAD (coronary artery disease)   . Cardiomyopathy, ischemic   . CHF (congestive heart failure) ([place])   . Chronic back pain   . Diabetes mellitus without complication (HCC)    prediabetes  . Elevated PSA   . History of kidney stones   . Hyperlipidemia   . Morbid obesity ([place])   . Myocardial infarction ([place])   . OSA (obstructive sleep apnea)    does not wear CPAP, -order run out told need to redo  test    Past Surgical History:  Procedure Laterality Date  . APPENDECTOMY    . CARDIAC CATHETERIZATION     stents  . CAROTID STENT     denies carotid stent  . CATARACT EXTRACTION, BILATERAL    . CORONARY ARTERY BYPASS GRAFT  2001  . EP IMPLANTABLE DEVICE N/A 05/18/2016   Procedure: ICD Implant;  Surgeon: Will Meredith Leeds, MD;  Location: [facility];  Service: Cardiovascular;  Laterality: N/A;  . EYE SURGERY    . ingrown toenail N/A   . LEFT HEART CATHETERIZATION WITH CORONARY/GRAFT ANGIOGRAM N/A 12/21/2014   Procedure: LEFT HEART CATHETERIZATION WITH  Beatrix Fetters;  Surgeon: Belva Crome, MD;  Location: [facility] CATH LAB;  Service: Cardiovascular;  Laterality: N/A;  . NASAL SEPTOPLASTY W/ TURBINOPLASTY Bilateral 04/25/2017   NASAL SEPTOPLASTY WITH TURBINATE REDUCTION/notes 04/25/2017  . NASAL SEPTOPLASTY W/ TURBINOPLASTY Bilateral 04/25/2017   Procedure: NASAL SEPTOPLASTY WITH TURBINATE REDUCTION;  Surgeon: Leta Baptist, MD;  Location: [facility];  Service: ENT;  Laterality: Bilateral;  . TONSILLECTOMY      Current Outpatient Medications  Medication Sig Dispense Refill  . carvedilol (COREG) 6.25 MG tablet Take 1 tablet  (6.25 mg total) by mouth 2 (two) times daily with a meal. 60 tablet 6  . clopidogrel (PLAVIX) 75 MG tablet Take 1 tablet (75 mg total) by mouth daily. 90 tablet 3  . HYDROcodone-acetaminophen (NORCO) 10-325 MG tablet 1 bid prn pain (Patient taking differently: Take 2 tablets by mouth daily as needed for moderate pain. ) 60 tablet 0  . Insulin Detemir (LEVEMIR) 100 UNIT/ML Pen Inject 12 units into skin each evening. May titrate up to 60 units. (Patient taking differently: Inject 60 Units into the skin at bedtime. ) 15 mL 5  . metolazone (ZAROXOLYN) 2.5 MG tablet Take 2.5 mg by mouth See admin instructions. Take 2.5 mg by mouth once daily every 5 days    . Multiple Vitamins-Minerals (CENTRUM SILVER PO) Take 1 tablet by mouth daily.     . nitroGLYCERIN (NITROSTAT) 0.4 MG SL tablet PLACE 1 TABLET UNDER TONGUE FOR CHEST PAIN. MAY REPEAT EVERY 5 MIN UPTO 3 DOSES-NO RELIEF,CALL 911. (Patient taking differently: Place 0.4 mg under the tongue every 5 (five) minutes as needed for chest pain. UPTO 3 DOSES-NO RELIEF,CALL 911.) 25 tablet 6  . polycarbophil (FIBERCON) 625 MG tablet Take 2 tablets (1,250 mg total) by mouth daily with lunch. Hold if you have diarrhea (Patient taking differently: Take 1,250 mg by mouth 3 (three) times daily. Hold if you have diarrhea) 30 tablet 0  . potassium chloride SA (K-DUR,KLOR-CON) 20 MEQ tablet 2 tablets in the am, one tablet midday and 2 tablets qpm (Patient taking differently: Take 20-40 mEq by mouth See admin instructions. Take 40 meq by mouth in the morning, and take 20 meq by mouth midday and take 40 meq by mouth in the evening) 450 tablet 0  . rosuvastatin (CRESTOR) 40 MG tablet Take 1 tablet (40 mg total) by mouth daily. 90 tablet 2  . torsemide (DEMADEX) 20 MG tablet TAKE (2) TABLETS BY MOUTH TWICE DAILY. (Patient taking differently: TAKE 40 MG BY MOUTH TWICE DAILY.) 180 tablet 0  . trolamine salicylate (ASPERCREME) 10 % cream Apply 1 application topically 2 (two) times  daily as needed for muscle pain.     Marland Kitchen aspirin-acetaminophen-caffeine (EXCEDRIN MIGRAINE) 250-250-65 MG tablet Take 2-3 tablets by mouth every 6 (six) hours as needed for headache.    . liver oil-zinc oxide (DESITIN) 40 % ointment Apply 1 application topically as needed for irritation.    . Naphazoline HCl (CLEAR EYES OP) Place 2 drops into both eyes daily as needed (for dry eyes. Gel and PF).    . NOVOLOG FLEXPEN 100 UNIT/ML FlexPen INJECT 6 UNITS INTO THE SKIN WITH EACH MEAL. MAY TITRATE UP 14 UNITS. (Patient taking differently: Inject 20 units SQ twice daily after meals) 15 mL 0  . sodium chloride (OCEAN) 0.65 % SOLN nasal spray Place 2 sprays into both nostrils 2 (two) times daily as needed for congestion.     No current facility-administered medications for this visit.  Allergies:   Dyflex-g [dyphylline-guaifenesin]   Social History:  The patient  reports that he quit smoking about 18 years ago. he has never used smokeless tobacco. He reports that he does not drink alcohol or use drugs.   Family History:  The patient's family history includes CAD in his mother; Diabetes in his mother. He was adopted.    ROS:  Please see the history of present illness.  Otherwise, review of systems is positive for fatigue.  All other systems are reviewed and negative.    PHYSICAL EXAM: VS:  BP 124/80   Pulse 82   Ht 5' 10.5" (1.791 m)   Wt 287 lb (130.2 kg)   SpO2 99%   BMI 40.60 kg/m  , BMI Body mass index is 40.6 kg/m. GEN: obese male, in no acute distress  HEENT: normal  Neck: no JVD, no masses. No carotid bruits Cardiac: RRR with 2/6 harsh mid peaking systolic murmur at the RUSB, A2 audible            Respiratory:  clear to auscultation bilaterally, normal work of breathing GI: soft, nontender, nondistended, + BS MS: no deformity or atrophy  Ext: 1+ bilateral pretibial edema Skin: warm and dry, no rash Neuro:  Strength and sensation are intact Psych: euthymic mood, full  affect  EKG:  EKG is not ordered today.  Recent Labs: 01/15/2017: Magnesium 2.0 06/25/2017: ALT 11 09/28/2017: BUN 18; Creatinine, Ser 0.94; Hemoglobin 15.0; Platelets 193; Potassium 4.2; Sodium 139   Lipid Panel     Component Value Date/Time   CHOL 116 06/25/2017 1140   TRIG 143 06/25/2017 1140   HDL 35 (L) 06/25/2017 1140   CHOLHDL 3.3 06/25/2017 1140   CHOLHDL 3.9 01/02/2015 1010   VLDL 31 01/02/2015 1010   LDLCALC 52 06/25/2017 1140      Wt Readings from Last 3 Encounters:  09/28/17 287 lb (130.2 kg)  08/30/17 277 lb (125.6 kg)  08/30/17 278 lb 6.4 oz (126.3 kg)     Cardiac Studies Reviewed: Echo 09-07-2017: Impressions:  - Mild LVH with moderate chamber dilatation and LVEF approximately   20%. There is diffuse hypokinesis, most prominent in the   inferolateral wall. Grade 2 diastolic dysfunction. Probable   loosely organized thrombotic material at the LV apex based on   Definity contrast imaging. Mild to moderate mitral annular   calcification. Probable bicuspid aortic valve, moderately   calcified, and with evidence of potential severe aortic stenosis   based on dimensionless index with only modestly elevated   gradients. Device wire present within the right heart. Mildly   reduced right ventricular contraction. Trivial tricuspid   regurgitation.  ------------------------------------------------------------------- Study data:  Comparison was made to the study of 09/12/2016.  Study status:  Routine.  Procedure:  The patient reported no pain pre or post test. Transthoracic echocardiography. Image quality was adequate.  Study completion:  There were no complications. Transthoracic echocardiography.  M-mode, complete 2D, spectral Doppler, and color Doppler.  Birthdate:  Patient birthdate: 1950-09-09.  Age:  Patient is 67 yr old.  Sex:  Gender: male. BMI: 39.2 kg/m^2.  Blood pressure:     134/84  Patient status: Inpatient.  Study date:  Study date: 09/07/2017.  Study time: 08:37 AM.  Location:  Echo laboratory.  -------------------------------------------------------------------  ------------------------------------------------------------------- Left ventricle:  The cavity size was moderately dilated. Wall thickness was increased in a pattern of mild LVH. The estimated ejection fraction was 20%. Diffuse hypokinesis.  Regional wall motion abnormalities:   There is  severe hypokinesis of the inferolateral myocardium. Probable loosely organized thrombotic material at the LV apex based on Definity contrast imaging. Features are consistent with a pseudonormal left ventricular filling pattern, with concomitant abnormal relaxation and increased filling pressure (grade 2 diastolic dysfunction).  ------------------------------------------------------------------- Aortic valve:   Moderately calcified annulus. Possibly bicuspid; moderately calcified leaflets.  Doppler:  There was no significant regurgitation.    VTI ratio of LVOT to aortic valve: 0.18. Valve area (VTI): 0.7 cm^2. Indexed valve area (VTI): 0.27 cm^2/m^2. Peak velocity ratio of LVOT to aortic valve: 0.2. Valve area (Vmax): 0.77 cm^2. Indexed valve area (Vmax): 0.3 cm^2/m^2. Mean velocity ratio of LVOT to aortic valve: 0.19. Valve area (Vmean): 0.73 cm^2. Indexed valve area (Vmean): 0.29 cm^2/m^2.    Mean gradient (S): 26 mm Hg. Peak gradient (S): 42 mm Hg.  ------------------------------------------------------------------- Aorta:  Aortic root: The aortic root was normal in size.  ------------------------------------------------------------------- Mitral valve:   Mildly to moderately calcified annulus.  Doppler: There was no significant regurgitation.    Peak gradient (D): 4 mm Hg.  ------------------------------------------------------------------- Left atrium:  The atrium was normal in size.  ------------------------------------------------------------------- Atrial  septum:  No defect or patent foramen ovale was identified.   ------------------------------------------------------------------- Right ventricle:  The cavity size was normal. Pacer wire or catheter noted in right ventricle. Systolic function was mildly reduced.  ------------------------------------------------------------------- Pulmonic valve:    The valve appears to be grossly normal. Doppler:  There was no significant regurgitation.  ------------------------------------------------------------------- Tricuspid valve:   The valve appears to be grossly normal. Doppler:  There was trivial regurgitation.  ------------------------------------------------------------------- Pulmonary artery:    Systolic pressure could not be accurately estimated.  ------------------------------------------------------------------- Right atrium:  The atrium was normal in size. Pacer wire or catheter noted in right atrium.  ------------------------------------------------------------------- Pericardium:  There was no pericardial effusion.  ------------------------------------------------------------------- Systemic veins: Inferior vena cava: The vessel was normal in size. The respirophasic diameter changes were in the normal range (= 50%), consistent with normal central venous pressure.  STS Risk Calculator: Procedure: AVR + CAB  Risk of Mortality: 7.539%  Renal Failure: 5.610%  Permanent Stroke: 1.570%  Prolonged Ventilation: 34.707%  DSW Infection: 0.414%  Reoperation: 5.414%  Morbidity or Mortality: 43.063%  Short Length of Stay: 15.655%  Long Length of Stay: 17.385%   ASSESSMENT AND PLAN: 1.  Chronic systolic heart failure secondary to severe underlying ischemic cardiomyopathy with LVEF 10-15% 2.  Coronary artery disease with remote CABG in 2001 and subsequent PCI procedures with chronic angina 3.  Progressive degenerative aortic stenosis, now in the moderate range by echo  criteria, possible severe low flow low gradient aortic stenosis 4.  Morbid obesity 5. 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  I counseled the patient and his wife about the natural history of aortic stenosis, specifically in the context of his comorbid conditions including very severe LV dysfunction and chronic heart failure with severe ischemic heart disease.  At this point it is not clear to me whether he truly has severe aortic stenosis.  There is reasonably good mobility of the right coronary cusp with dense calcification of the noncoronary and left coronary cusps.  The patient's transvalvular gradients are moderately increased with peak and mean gradients of 42 and 26 mmHg and a dimensionless index less than 0.18. Past echo studies are reviewed and the mean aortic gradients have increased from 11 mmHg in 2017 to 17 mm Hg in 2018  to 26 mm Hg on the current study, all in the setting of very severe LV systolic dysfunction.   This was a difficult interaction today.  The patient was irritated that he had to provide any history and continued to ask me to just look at the chart.  He did not want to talk about any procedural risks of either cardiac catheterization or TAVR.  He asked that we just do the CT scan and cardiac cath studies to determine whether he was a candidate to have his valve fixed.  I explained to him that evaluation for TAVR necessitates multiple diagnostic tests and multidisciplinary consultation.  I tried to convey to him that his situation is quite complex because of his severe cardiomyopathy and other comorbid conditions and we need to determine first whether he in fact needs aortic valve replacement and secondly whether he is a candidate for such based on his LVEF of 10-15%.  He is willing to undergo necessary diagnostic tests which will include right and left heart  catheterization and CT angiography studies of the heart and peripheral vessels.  He will be referred for formal cardiac surgical evaluation once his studies are completed. I have reviewed the risks, indications, and alternatives to cardiac catheterization, possible angioplasty, and stenting with the patient. Risks include but are not limited to bleeding, infection, vascular injury, stroke, myocardial infection, arrhythmia, kidney injury, radiation-related injury in the case of prolonged fluoroscopy use, emergency cardiac surgery, and death. The patient understands the risks of serious complication is 1-2 in 4680 with diagnostic cardiac cath and 1-2% or less with angioplasty/stenting.   I don't appreciate clear signs of apical thrombus on review of his echo, but note plans to repeat an echo in one month with contrast. I am hopeful the his gated cardiac CTA will better define the LV apex. If he in fact has LV apical thrombus he will require a period of anticoagulation prior to undergoing TAVR.   Current medicines are reviewed with the patient today.  The patient does not have concerns regarding medicines.  Labs/ tests ordered today include:   Orders Placed This Encounter  Procedures  . Basic Metabolic Panel (BMET)  . CBC  . INR/PT    Disposition:   FU pending test results  Signed, Sherren Mocha, MD  10/02/2017 6:05 AM    [address], [place], [place]  [address] Phone: [phone]; Fax: [phone]

## 2017-09-28 ENCOUNTER — Ambulatory Visit (INDEPENDENT_AMBULATORY_CARE_PROVIDER_SITE_OTHER): Payer: Medicare Other | Admitting: Cardiovascular Disease

## 2017-09-28 ENCOUNTER — Encounter: Payer: Self-pay | Admitting: Cardiovascular Disease

## 2017-09-28 VITALS — BP 124/80 | HR 82 | Ht 70.5 in | Wt 287.0 lb

## 2017-09-28 DIAGNOSIS — I350 Nonrheumatic aortic (valve) stenosis: Secondary | ICD-10-CM | POA: Diagnosis not present

## 2017-09-28 NOTE — Patient Instructions (Addendum)
Medication Instructions:  Your physician recommends that you continue on your current medications as directed. Please refer to the Current Medication list given to you today.  Labwork: Your physician recommends that you have lab work today: BMP, CBC and PT/INR  Testing/Procedures: Your physician has requested that you have a cardiac catheterization. Cardiac catheterization is used to diagnose and/or treat various heart conditions. Doctors may recommend this procedure for a number of different reasons. The most common reason is to evaluate chest pain. Chest pain can be a symptom of coronary artery disease (CAD), and cardiac catheterization can show whether plaque is narrowing or blocking your heart's arteries. This procedure is also used to evaluate the valves, as well as measure the blood flow and oxygen levels in different parts of your heart. For further information please visit [email or web address]. Please follow instruction sheet, as given.    [facility] OFFICE [address] [place] [address] Dept: [phone] Loc: [place]  09/28/2017  You are scheduled for a Cardiac Catheterization on Friday, February 8 with Dr. Sherren Mocha.  1. Please arrive at the [facility] (Main Entrance A) at [facility]: [address] [place], [place] [address] at 5:30 AM (two hours before your procedure to ensure your preparation). Free valet parking service is available.   Special note: Every effort is made to have your procedure done on time. Please understand that emergencies sometimes delay scheduled procedures.  2. Diet: Do not eat or drink anything after midnight prior to your procedure except sips of water to take medications.  3. Labs: None needed.  4. Medication instructions in preparation for your procedure:  Take only half of your normal dosage/ units of insulin the  night before your procedure. Do not take any insulin on the day of the procedure.   Do NOT take Demadex the morning of procedure.  Please take one Aspirin 81mg  the morning of procedure before coming to the hospital.   On the morning of your procedure, take your Aspirin and Plavix/Clopidogrel and any morning medicines NOT listed above.  You may use sips of water.  5. Plan for one night stay--bring personal belongings.  6. Bring a current list of your medications and current insurance cards.  7. You MUST have a responsible person to drive you home.  8. Someone MUST be with you the first 24 hours after you arrive home or your discharge will be delayed.  9. Please wear clothes that are easy to get on and off and wear slip-on shoes.  Thank you for allowing [place] to care for you!   --  Invasive Cardiovascular services   Follow-Up: We will arrange additional TAVR evaluation after cardiac catheterization.   Any Other Special Instructions Will Be Listed Below (If Applicable).     If you need a refill on your cardiac medications before your next appointment, please call your pharmacy.

## 2017-09-29 ENCOUNTER — Other Ambulatory Visit: Payer: Self-pay | Admitting: Family Medicine

## 2017-09-29 LAB — BASIC METABOLIC PANEL
BUN/Creatinine Ratio: 19 (ref 10–24)
BUN: 18 mg/dL (ref 8–27)
CALCIUM: 9.3 mg/dL (ref 8.6–10.2)
CO2: 27 mmol/L (ref 20–29)
CREATININE: 0.94 mg/dL (ref 0.76–1.27)
Chloride: 96 mmol/L (ref 96–106)
GFR calc Af Amer: 97 mL/min/{1.73_m2} (ref >59)
GFR, EST NON AFRICAN AMERICAN: 84 mL/min/{1.73_m2} (ref >59)
GLUCOSE: 153 mg/dL — AB (ref 65–99)
Potassium: 4.2 mmol/L (ref 3.5–5.2)
Sodium: 139 mmol/L (ref 134–144)

## 2017-09-29 LAB — CBC
HEMOGLOBIN: 15 g/dL (ref 13.0–17.7)
Hematocrit: 43.3 % (ref 37.5–51.0)
MCH: 32.8 pg (ref 26.6–33.0)
MCHC: 34.6 g/dL (ref 31.5–35.7)
MCV: 95 fL (ref 79–97)
Platelets: 193 10*3/uL (ref 150–379)
RBC: 4.57 x10E6/uL (ref 4.14–5.80)
RDW: 15.5 % — ABNORMAL HIGH (ref 12.3–15.4)
WBC: 8.7 10*3/uL (ref 3.4–10.8)

## 2017-09-29 LAB — PROTIME-INR
INR: 1.1 (ref 0.8–1.2)
PROTHROMBIN TIME: 11.1 s (ref 9.1–12.0)

## 2017-10-02 ENCOUNTER — Encounter: Payer: Self-pay | Admitting: Cardiovascular Disease

## 2017-10-04 ENCOUNTER — Telehealth: Payer: Self-pay | Admitting: *Deleted

## 2017-10-04 NOTE — Telephone Encounter (Signed)
Pt contacted pre-catheterization scheduled at [facility] for: Friday February 8,2019 at 7:30 AM Verified arrival time and place: [place] Entrance A/[place] at: 5:30 AM   HOLD medications: Metolazone AM of cath Torsemide AM of cath Insulin AM of cath  1/2 Insulin PM before cath  Except HOLD medications verified AM meds can be  taken pre-cath with sip of water including: ASA 81 mg am of cath Plavix am of cath  Confirmed patient has responsible person to drive home post procedure and observe patient for 24 hours:

## 2017-10-04 NOTE — Telephone Encounter (Signed)
I spoke with patient's wife, Karen,confirmed and discussed instructions for cath, she verbalized understanding, thanked me for call.  Confirmed allergies listed in Epic. Confirmed no oral diabetic medications.

## 2017-10-05 ENCOUNTER — Ambulatory Visit (HOSPITAL_COMMUNITY)
Admission: RE | Disposition: A | Discharge status: Home / Self Care | Payer: Self-pay | Source: Ambulatory Visit | Attending: Cardiovascular Disease

## 2017-10-05 ENCOUNTER — Encounter (HOSPITAL_COMMUNITY): Payer: Self-pay

## 2017-10-05 ENCOUNTER — Ambulatory Visit (HOSPITAL_COMMUNITY)
Admission: RE | Admit: 2017-10-05 | Discharge: 2017-10-05 | Disposition: A | Discharge status: Home / Self Care | Payer: Medicare Other | Source: Ambulatory Visit | Attending: Cardiovascular Disease | Admitting: Cardiovascular Disease

## 2017-10-05 DIAGNOSIS — Z87891 Personal history of nicotine dependence: Secondary | ICD-10-CM | POA: Diagnosis not present

## 2017-10-05 DIAGNOSIS — Z6841 Body Mass Index (BMI) 40.0 and over, adult: Secondary | ICD-10-CM | POA: Diagnosis not present

## 2017-10-05 DIAGNOSIS — E785 Hyperlipidemia, unspecified: Secondary | ICD-10-CM | POA: Insufficient documentation

## 2017-10-05 DIAGNOSIS — I5043 Acute on chronic combined systolic (congestive) and diastolic (congestive) heart failure: Secondary | ICD-10-CM

## 2017-10-05 DIAGNOSIS — Z95810 Presence of automatic (implantable) cardiac defibrillator: Secondary | ICD-10-CM | POA: Diagnosis not present

## 2017-10-05 DIAGNOSIS — Z794 Long term (current) use of insulin: Secondary | ICD-10-CM | POA: Diagnosis not present

## 2017-10-05 DIAGNOSIS — I5022 Chronic systolic (congestive) heart failure: Secondary | ICD-10-CM | POA: Diagnosis not present

## 2017-10-05 DIAGNOSIS — I2510 Atherosclerotic heart disease of native coronary artery without angina pectoris: Secondary | ICD-10-CM

## 2017-10-05 DIAGNOSIS — E119 Type 2 diabetes mellitus without complications: Secondary | ICD-10-CM | POA: Diagnosis not present

## 2017-10-05 DIAGNOSIS — I350 Nonrheumatic aortic (valve) stenosis: Secondary | ICD-10-CM | POA: Insufficient documentation

## 2017-10-05 DIAGNOSIS — I25718 Atherosclerosis of autologous vein coronary artery bypass graft(s) with other forms of angina pectoris: Secondary | ICD-10-CM | POA: Insufficient documentation

## 2017-10-05 DIAGNOSIS — M549 Dorsalgia, unspecified: Secondary | ICD-10-CM | POA: Insufficient documentation

## 2017-10-05 DIAGNOSIS — I252 Old myocardial infarction: Secondary | ICD-10-CM | POA: Diagnosis not present

## 2017-10-05 DIAGNOSIS — I255 Ischemic cardiomyopathy: Secondary | ICD-10-CM | POA: Diagnosis not present

## 2017-10-05 DIAGNOSIS — M1990 Unspecified osteoarthritis, unspecified site: Secondary | ICD-10-CM | POA: Insufficient documentation

## 2017-10-05 DIAGNOSIS — Z7902 Long term (current) use of antithrombotics/antiplatelets: Secondary | ICD-10-CM | POA: Diagnosis not present

## 2017-10-05 DIAGNOSIS — I2582 Chronic total occlusion of coronary artery: Secondary | ICD-10-CM | POA: Diagnosis not present

## 2017-10-05 DIAGNOSIS — G8929 Other chronic pain: Secondary | ICD-10-CM | POA: Insufficient documentation

## 2017-10-05 DIAGNOSIS — G4733 Obstructive sleep apnea (adult) (pediatric): Secondary | ICD-10-CM | POA: Diagnosis not present

## 2017-10-05 DIAGNOSIS — I509 Heart failure, unspecified: Secondary | ICD-10-CM

## 2017-10-05 HISTORY — PX: RIGHT/LEFT HEART CATH AND CORONARY/GRAFT ANGIOGRAPHY: CATH118267

## 2017-10-05 LAB — GLUCOSE, CAPILLARY
Glucose-Capillary: 172 mg/dL — ABNORMAL HIGH (ref 65–99)
Glucose-Capillary: 151 mg/dL — ABNORMAL HIGH (ref 65–99)

## 2017-10-05 LAB — POCT I-STAT 3, VENOUS BLOOD GAS (G3P V)
Acid-Base Excess: 9 mmol/L — ABNORMAL HIGH (ref 0.0–2.0)
BICARBONATE: 36 mmol/L — AB (ref 20.0–28.0)
O2 Saturation: 71 %
PCO2 VEN: 58.5 mmHg (ref 44.0–60.0)
PO2 VEN: 39 mmHg (ref 32.0–45.0)
TCO2: 38 mmol/L — AB (ref 22–32)
pH, Ven: 7.397 (ref 7.250–7.430)

## 2017-10-05 LAB — POCT I-STAT 3, ART BLOOD GAS (G3+)
ACID-BASE EXCESS: 7 mmol/L — AB (ref 0.0–2.0)
BICARBONATE: 34.2 mmol/L — AB (ref 20.0–28.0)
O2 Saturation: 94 %
PO2 ART: 75 mmHg — AB (ref 83.0–108.0)
TCO2: 36 mmol/L — AB (ref 22–32)
pCO2 arterial: 56.9 mmHg — ABNORMAL HIGH (ref 32.0–48.0)
pH, Arterial: 7.387 (ref 7.350–7.450)

## 2017-10-05 SURGERY — RIGHT/LEFT HEART CATH AND CORONARY/GRAFT ANGIOGRAPHY
Anesthesia: LOCAL

## 2017-10-05 MED ORDER — ASPIRIN 81 MG PO CHEW
81.0000 mg | CHEWABLE_TABLET | ORAL | Status: AC
  Administered 2017-10-05: 81 mg via ORAL

## 2017-10-05 MED ORDER — MIDAZOLAM HCL 2 MG/2ML IJ SOLN
INTRAMUSCULAR | Status: AC
  Filled 2017-10-05: qty 2

## 2017-10-05 MED ORDER — SODIUM CHLORIDE 0.9% FLUSH: 3.0000 mL | Freq: Two times a day (BID) | INTRAVENOUS | Status: DC

## 2017-10-05 MED ORDER — CLOPIDOGREL BISULFATE 75 MG PO TABS
ORAL_TABLET | ORAL | Status: AC
  Administered 2017-10-05: 75 mg via ORAL
  Filled 2017-10-05: qty 1

## 2017-10-05 MED ORDER — OXYCODONE HCL 5 MG PO TABS: 5.0000 mg | ORAL_TABLET | ORAL | Status: DC | PRN

## 2017-10-05 MED ORDER — SODIUM CHLORIDE 0.9 % WEIGHT BASED INFUSION: 3.0000 mL/kg/h | INTRAVENOUS | Status: AC

## 2017-10-05 MED ORDER — SODIUM CHLORIDE 0.9% FLUSH: 3.0000 mL | INTRAVENOUS | Status: DC | PRN

## 2017-10-05 MED ORDER — HEPARIN (PORCINE) IN NACL 2-0.9 UNIT/ML-% IJ SOLN
INTRAMUSCULAR | Status: AC
  Filled 2017-10-05: qty 1000

## 2017-10-05 MED ORDER — IOPAMIDOL (ISOVUE-370) INJECTION 76%
INTRAVENOUS | Status: DC | PRN
  Administered 2017-10-05: 85 mL via INTRA_ARTERIAL

## 2017-10-05 MED ORDER — SODIUM CHLORIDE 0.9 % IV SOLN: 250.0000 mL | INTRAVENOUS | Status: DC | PRN

## 2017-10-05 MED ORDER — DIAZEPAM 5 MG PO TABS: 5.0000 mg | ORAL_TABLET | ORAL | Status: DC | PRN

## 2017-10-05 MED ORDER — IOPAMIDOL (ISOVUE-370) INJECTION 76%
INTRAVENOUS | Status: AC
  Filled 2017-10-05: qty 150

## 2017-10-05 MED ORDER — MIDAZOLAM HCL 2 MG/2ML IJ SOLN
INTRAMUSCULAR | Status: DC | PRN
  Administered 2017-10-05 (×2): 1 mg via INTRAVENOUS

## 2017-10-05 MED ORDER — LIDOCAINE HCL (PF) 1 % IJ SOLN
INTRAMUSCULAR | Status: AC
  Filled 2017-10-05: qty 30

## 2017-10-05 MED ORDER — HEPARIN (PORCINE) IN NACL 2-0.9 UNIT/ML-% IJ SOLN
INTRAMUSCULAR | Status: AC | PRN
  Administered 2017-10-05: 1000 mL

## 2017-10-05 MED ORDER — FENTANYL CITRATE (PF) 100 MCG/2ML IJ SOLN
INTRAMUSCULAR | Status: DC | PRN
  Administered 2017-10-05 (×2): 25 ug via INTRAVENOUS

## 2017-10-05 MED ORDER — LIDOCAINE HCL (PF) 1 % IJ SOLN
INTRAMUSCULAR | Status: DC | PRN
  Administered 2017-10-05: 15 mL

## 2017-10-05 MED ORDER — MORPHINE SULFATE (PF) 10 MG/ML IV SOLN: 2.0000 mg | INTRAVENOUS | Status: DC | PRN

## 2017-10-05 MED ORDER — FENTANYL CITRATE (PF) 100 MCG/2ML IJ SOLN
INTRAMUSCULAR | Status: AC
  Filled 2017-10-05: qty 2

## 2017-10-05 MED ORDER — ASPIRIN 81 MG PO CHEW
CHEWABLE_TABLET | ORAL | Status: AC
  Administered 2017-10-05: 81 mg via ORAL
  Filled 2017-10-05: qty 1

## 2017-10-05 MED ORDER — CLOPIDOGREL BISULFATE 75 MG PO TABS
75.0000 mg | ORAL_TABLET | ORAL | Status: AC
  Administered 2017-10-05: 75 mg via ORAL

## 2017-10-05 MED ORDER — SODIUM CHLORIDE 0.9 % WEIGHT BASED INFUSION
1.0000 mL/kg/h | INTRAVENOUS | Status: DC
  Administered 2017-10-05: 1 mL/kg/h via INTRAVENOUS

## 2017-10-05 SURGICAL SUPPLY — 18 items
CATH INFINITI 5 FR RCB (CATHETERS) ×1 IMPLANT
CATH INFINITI 5FR AL1 (CATHETERS) ×1 IMPLANT
CATH INFINITI 5FR MULTPACK ANG (CATHETERS) ×2 IMPLANT
CATH SWAN GANZ 7F STRAIGHT (CATHETERS) ×1 IMPLANT
COVER PRB 48X5XTLSCP FOLD TPE (BAG) IMPLANT
COVER PROBE 5X48 (BAG) ×2
KIT HEART LEFT (KITS) ×2 IMPLANT
PACK CARDIAC CATHETERIZATION (CUSTOM PROCEDURE TRAY) ×2 IMPLANT
PINNACLE LONG 5F 25CM (SHEATH) ×2
PINNACLE LONG 7F 25CM (SHEATH) ×2
SHEATH INTRO PINNACLE 7F 25CM (SHEATH) IMPLANT
SHEATH INTROD PINNACLE 5F 25CM (SHEATH) IMPLANT
SHEATH PINNACLE 5F 10CM (SHEATH) ×1 IMPLANT
SHEATH PINNACLE 7F 10CM (SHEATH) ×1 IMPLANT
TRANSDUCER W/STOPCOCK (MISCELLANEOUS) ×2 IMPLANT
TUBING CIL FLEX 10 FLL-RA (TUBING) ×2 IMPLANT
WIRE EMERALD 3MM-J .025X260CM (WIRE) ×1 IMPLANT
WIRE EMERALD 3MM-J .035X150CM (WIRE) ×1 IMPLANT

## 2017-10-05 NOTE — Progress Notes (Signed)
Site area: rt groin fa sheath Site Prior to Removal:  Level 0 Pressure Applied For: 20 minutes Manual:   yes Patient Status During Pull:  stable Post Pull Site:  Level 0 Post Pull Instructions Given:  yes Post Pull Pulses Present: palpable Dressing Applied:  Gauze and tegaderm Bedrest begins @ 0930 Comments: IV locked

## 2017-10-05 NOTE — Interval H&P Note (Signed)
History and Physical Interval Note:  10/05/2017 7:51 AM  [name] [name]  has presented today for surgery, with the diagnosis of severe as  The various methods of treatment have been discussed with the patient and family. After consideration of risks, benefits and other options for treatment, the patient has consented to  Procedure(s): RIGHT/LEFT HEART CATH AND CORONARY/GRAFT ANGIOGRAPHY (N/A) as a surgical intervention .  The patient's history has been reviewed, patient examined, no change in status, stable for surgery.  I have reviewed the patient's chart and labs.  Questions were answered to the patient's satisfaction.     Sherren Mocha

## 2017-10-05 NOTE — Discharge Instructions (Signed)

## 2017-10-08 ENCOUNTER — Other Ambulatory Visit: Payer: Self-pay

## 2017-10-08 ENCOUNTER — Ambulatory Visit (HOSPITAL_COMMUNITY)
Admission: RE | Admit: 2017-10-08 | Discharge: 2017-10-08 | Disposition: A | Discharge status: Home / Self Care | Payer: Medicare Other | Source: Ambulatory Visit | Attending: Cardiology | Admitting: Cardiology

## 2017-10-08 DIAGNOSIS — I110 Hypertensive heart disease with heart failure: Secondary | ICD-10-CM | POA: Insufficient documentation

## 2017-10-08 DIAGNOSIS — I509 Heart failure, unspecified: Secondary | ICD-10-CM | POA: Diagnosis not present

## 2017-10-08 DIAGNOSIS — R0602 Shortness of breath: Secondary | ICD-10-CM | POA: Insufficient documentation

## 2017-10-08 DIAGNOSIS — I350 Nonrheumatic aortic (valve) stenosis: Secondary | ICD-10-CM

## 2017-10-08 MED ORDER — PERFLUTREN LIPID MICROSPHERE
1.0000 mL | INTRAVENOUS | Status: AC | PRN
  Administered 2017-10-08: 3 mL via INTRAVENOUS

## 2017-10-08 MED FILL — Heparin Sodium (Porcine) 2 Unit/ML in Sodium Chloride 0.9%: INTRAMUSCULAR | Qty: 1000 | Status: AC

## 2017-10-08 NOTE — Progress Notes (Signed)
*  PRELIMINARY RESULTS* Echocardiogram 2D Echocardiogram WITH DEFINITY has been performed.  Leavy Cella 10/08/2017, 10:38 AM

## 2017-10-16 ENCOUNTER — Ambulatory Visit (HOSPITAL_COMMUNITY)
Admission: RE | Admit: 2017-10-16 | Discharge: 2017-10-16 | Disposition: A | Discharge status: Home / Self Care | Payer: Medicare Other | Source: Ambulatory Visit | Attending: Cardiovascular Disease | Admitting: Cardiovascular Disease

## 2017-10-16 ENCOUNTER — Encounter (HOSPITAL_COMMUNITY): Payer: Self-pay

## 2017-10-16 DIAGNOSIS — I700 Atherosclerosis of aorta: Secondary | ICD-10-CM | POA: Insufficient documentation

## 2017-10-16 DIAGNOSIS — Z954 Presence of other heart-valve replacement: Secondary | ICD-10-CM | POA: Insufficient documentation

## 2017-10-16 DIAGNOSIS — Z951 Presence of aortocoronary bypass graft: Secondary | ICD-10-CM | POA: Insufficient documentation

## 2017-10-16 DIAGNOSIS — D3500 Benign neoplasm of unspecified adrenal gland: Secondary | ICD-10-CM | POA: Diagnosis not present

## 2017-10-16 DIAGNOSIS — I350 Nonrheumatic aortic (valve) stenosis: Secondary | ICD-10-CM

## 2017-10-16 DIAGNOSIS — E279 Disorder of adrenal gland, unspecified: Secondary | ICD-10-CM | POA: Diagnosis not present

## 2017-10-16 DIAGNOSIS — I2510 Atherosclerotic heart disease of native coronary artery without angina pectoris: Secondary | ICD-10-CM | POA: Insufficient documentation

## 2017-10-16 MED ORDER — METOPROLOL TARTRATE 5 MG/5ML IV SOLN
5.0000 mg | INTRAVENOUS | Status: DC | PRN
  Administered 2017-10-16 (×3): 5 mg via INTRAVENOUS
  Filled 2017-10-16 (×3): qty 5

## 2017-10-16 MED ORDER — IOPAMIDOL (ISOVUE-370) INJECTION 76%
INTRAVENOUS | Status: AC
  Administered 2017-10-16: 100 mL
  Filled 2017-10-16: qty 100

## 2017-10-16 MED ORDER — METOPROLOL TARTRATE 5 MG/5ML IV SOLN
INTRAVENOUS | Status: AC
  Administered 2017-10-16: 5 mg via INTRAVENOUS
  Filled 2017-10-16: qty 20

## 2017-10-24 ENCOUNTER — Encounter: Payer: Self-pay | Admitting: Thoracic Surgery (Cardiothoracic Vascular Surgery)

## 2017-10-24 ENCOUNTER — Institutional Professional Consult (permissible substitution) (INDEPENDENT_AMBULATORY_CARE_PROVIDER_SITE_OTHER): Payer: Medicare Other | Admitting: Thoracic Surgery (Cardiothoracic Vascular Surgery)

## 2017-10-24 VITALS — BP 114/71 | HR 82 | Resp 20 | Ht 70.5 in | Wt 280.0 lb

## 2017-10-24 DIAGNOSIS — I25708 Atherosclerosis of coronary artery bypass graft(s), unspecified, with other forms of angina pectoris: Secondary | ICD-10-CM | POA: Diagnosis not present

## 2017-10-24 DIAGNOSIS — I350 Nonrheumatic aortic (valve) stenosis: Secondary | ICD-10-CM

## 2017-10-24 DIAGNOSIS — I255 Ischemic cardiomyopathy: Secondary | ICD-10-CM

## 2017-10-24 DIAGNOSIS — I5042 Chronic combined systolic (congestive) and diastolic (congestive) heart failure: Secondary | ICD-10-CM | POA: Diagnosis not present

## 2017-10-24 NOTE — H&P (View-Only) (Signed)
[facility]  CARDIOTHORACIC SURGERY CONSULTATION REPORT  Referring Provider is Branch, Alphonse Guild, MD PCP is Kathyrn Drown, MD  Chief Complaint  Patient presents with  . Aortic Stenosis    Surgical eval for TAVR, vs SAVR, review all studies    HPI:  Patient is a 67 year old morbidly obese male with history of coronary artery disease status post coronary artery bypass grafting in 2001, vein graft disease, 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  Patient's cardiac history dates back to 2001 he presented with an inferior wall myocardial infarction.  He was treated with PCI using a bare metal stent.  He developed restenosis requiring repeat angioplasty.  Ultimately he underwent coronary artery bypass grafting x6 in 2001 for unstable angina.  Grafts placed at the time of surgery included left internal mammary artery to the distal left anterior descending coronary artery, saphenous vein graft to the intermediate branch, sequential saphenous vein graft to the second obtuse marginal branch of the left circumflex and the posterior lateral branch off the distal right coronary artery, and sequential saphenous vein graft to the acute marginal branch and the posterior descending coronary artery is.  Patient's postoperative recovery was uneventful.  He did well for a prolonged period of time and was followed for many years by Dr. Lattie Haw.  He developed vein graft disease and underwent PCI and stenting of the saphenous vein graft to the ramus intermediate branch.  In 2015 echocardiogram  revealed severe left ventricular systolic dysfunction with ejection fraction estimated less than 15%.  Diagnostic catheterization performed at that time revealed severe native coronary artery disease with continued patency of left internal mammary artery to the distal left anterior descending coronary artery, patent saphenous vein graft to the obtuse marginal branch and the right posterior lateral branch, patent saphenous vein graft to the acute marginal with totally occluded limb to the posterior descending coronary artery, and patency of the stent in the saphenous vein graft to the ramus intermediate branch with eccentric 50-70% proximal stenosis in that vein graft.  He was treated medically.  In 2017 the patient was hospitalized with acute exacerbation of of congestive heart failure.  Follow-up echocardiogram performed at that time revealed left ventricular ejection fraction estimated 15%.  He was noted to have moderate aortic stenosis at that time with peak velocity across the aortic valve measured 2.9 m/s corresponding to mean transvalvular gradient estimated 22 mmHg.  The aortic valve leaflets were thickened but difficult to visualize.  Left ventricular ejection fraction did not improve with medical therapy.  He eventually underwent implantation of an ICD for primary prevention in September 2017.Marland Kitchen  His defibrillator has never fired.  In January 2018 he was hospitalized with another acute exacerbation of congestive heart failure.  He was diuresed 15 pounds with intravenous Lasix and clinically improved.  He was seen in follow-up recently by Dr. Harl Bowie and follow-up echocardiogram revealed severe left ventricular systolic dysfunction with significant progression in the severity of aortic stenosis.  Peak velocity across the aortic valve was reported 3.2 m/s corresponding to mean transvalvular gradient estimated 26 mmHg with DVI reported 0.18.  The distal apex of the left ventricle was not well visualized, and  mural thrombus  could not be ruled out.  The patient was referred to the multidisciplinary heart valve clinic and underwent left and right heart catheterization by Dr. Burt Knack on October 05, 2017.  Coronary angiography looks similar to previous catheterizations, with severe native coronary artery disease including total occlusion of the right coronary artery, left circumflex coronary artery and left anterior descending coronary artery.  There was continued patency of left internal mammary artery to the distal left anterior descending coronary artery, the saphenous vein graft to the ramus intermediate branch, saphenous vein graft to the obtuse marginal and posterolateral branch of the distal right coronary artery, and the saphenous vein graft to the posterior descending coronary artery.  Mean transvalvular gradient across the aortic valve was measured 27 mmHg, consistent with low flow, low gradient severe aortic stenosis.   Repeat transthoracic echocardiogram performed October 08, 2017 revealed persistent severe left ventricular systolic dysfunction with ejection fraction estimated 15%, but no sign of left ventricular mural thrombus.  Severity of aortic stenosis had progressed substantially with peak velocity across the aortic valve measured 3.7 m/s corresponding to mean transvalvular gradient estimated 34 mmHg.  The DVI was reported 0.19.  Patient subsequently underwent CT angiography and was referred for surgical consultation.  Patient is married and lives with his wife in [place].  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  He states that he still remains quite active physically, and he enjoys strenuous activity including heavy lifting.  He is a difficult historian.  He reports occasional episodes of substernal chest discomfort that are usually brought on with heavy lifting or other types of strenuous activity.  He has not had significant problems with his breathing recently.  He takes torsemide on a daily  basis and uses Zaroxolyn as needed when he gets problems with fluid overload.  He denies any PND, orthopnea, or lower extremity edema.  He has not had palpitations, dizzy spells, or syncope.  He has some chronic pain related to arthritis but he remains fairly mobile physically.   Past Medical History:  Diagnosis Date  . AICD (automatic cardioverter/defibrillator) present 05/18/2016  . Anginal pain ([place])    occ none recent  . Aortic stenosis   . Arthritis   . CAD (coronary artery disease)   . Cardiomyopathy, ischemic   . CHF (congestive heart failure) ([place])   . Chronic back pain   . Diabetes mellitus without complication ([place])   . Elevated PSA   . History of kidney stones   . Hyperlipidemia   . Morbid obesity ([place])   . Myocardial infarction ([place])   . OSA (obstructive sleep apnea)    does not wear CPAP, -order run out told need to redo  test  . 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 APPENDECTOMY    . CARDIAC CATHETERIZATION     stents  . CAROTID STENT     denies carotid stent  . CATARACT EXTRACTION, BILATERAL    . CORONARY ARTERY BYPASS GRAFT  12/23/1999   LIMA to LAD, SVG to ramus, sequential SVG to OM2-RPL, sequential SVG to AM-PDA  . EP IMPLANTABLE DEVICE N/A 05/18/2016   Procedure: ICD Implant;  Surgeon: Will Meredith Leeds, MD;  Location: [facility];  Service: Cardiovascular;  Laterality: N/A;  . EYE SURGERY    . ingrown toenail N/A   . LEFT HEART CATHETERIZATION WITH  CORONARY/GRAFT ANGIOGRAM N/A 12/21/2014   Procedure: LEFT HEART CATHETERIZATION WITH Beatrix Fetters;  Surgeon: Belva Crome, MD;  Location: [facility];  Service: Cardiovascular;  Laterality: N/A;  . NASAL SEPTOPLASTY W/ TURBINOPLASTY Bilateral 04/25/2017   NASAL SEPTOPLASTY WITH TURBINATE REDUCTION/notes 04/25/2017  . NASAL SEPTOPLASTY W/ TURBINOPLASTY Bilateral  04/25/2017   Procedure: NASAL SEPTOPLASTY WITH TURBINATE REDUCTION;  Surgeon: Leta Baptist, MD;  Location: [facility];  Service: ENT;  Laterality: Bilateral;  . RIGHT/LEFT HEART CATH AND CORONARY/GRAFT ANGIOGRAPHY N/A 10/05/2017   Procedure: RIGHT/LEFT HEART CATH AND CORONARY/GRAFT ANGIOGRAPHY;  Surgeon: Sherren Mocha, MD;  Location: [facility];  Service: Cardiovascular;  Laterality: N/A;  . TONSILLECTOMY      Family History  Adopted: Yes  Problem Relation Age of Onset  . Diabetes Mother   . CAD Mother     Social History   Socioeconomic History  . Marital status: Married    Spouse name: Not on file  . Number of children: Not on file  . Years of education: Not on file  . Highest education level: Not on file  Social Needs  . Financial resource strain: Not on file  . Food insecurity - worry: Not on file  . Food insecurity - inability: Not on file  . Transportation needs - medical: Not on file  . Transportation needs - non-medical: Not on file  Occupational History  . Occupation: retired  Tobacco Use  . Smoking status: Former Smoker    Last attempt to quit: 08/29/1999    Years since quitting: 18.1  . Smokeless tobacco: Never Used  . Tobacco comment: couldn't remember date  Substance and Sexual Activity  . Alcohol use: No    Alcohol/week: 0.0 oz  . Drug use: No  . Sexual activity: Not on file  Other Topics Concern  . Not on file  Social History Narrative  . Not on file    Current Outpatient Medications  Medication Sig Dispense Refill  . aspirin-acetaminophen-caffeine (EXCEDRIN MIGRAINE) 250-250-65 MG tablet Take 2-3 tablets by mouth every 6 (six) hours as needed for headache.    . carvedilol (COREG) 6.25 MG tablet Take 1 tablet (6.25 mg total) by mouth 2 (two) times daily with a meal. 60 tablet 6  . clopidogrel (PLAVIX) 75 MG tablet Take 1 tablet (75 mg total) by mouth daily. 90 tablet 3  . HYDROcodone-acetaminophen (NORCO) 10-325 MG tablet 1 bid prn pain (Patient taking  differently: Take 2 tablets by mouth daily as needed for moderate pain. ) 60 tablet 0  . Insulin Detemir (LEVEMIR) 100 UNIT/ML Pen Inject 12 units into skin each evening. May titrate up to 60 units. (Patient taking differently: Inject 60 Units into the skin at bedtime. ) 15 mL 5  . liver oil-zinc oxide (DESITIN) 40 % ointment Apply 1 application topically as needed for irritation.    . metolazone (ZAROXOLYN) 2.5 MG tablet Take 2.5 mg by mouth See admin instructions. Take 2.5 mg by mouth once daily every 5 days    . Multiple Vitamins-Minerals (CENTRUM SILVER PO) Take 1 tablet by mouth daily.     . Naphazoline HCl (CLEAR EYES OP) Place 2 drops into both eyes daily as needed (for dry eyes. Gel and PF).    . nitroGLYCERIN (NITROSTAT) 0.4 MG SL tablet PLACE 1 TABLET UNDER TONGUE FOR CHEST PAIN. MAY REPEAT EVERY 5 MIN UPTO 3 DOSES-NO RELIEF,CALL 911. (Patient taking differently: Place 0.4 mg under the tongue every 5 (five) minutes as needed for chest pain.  UPTO 3 DOSES-NO RELIEF,CALL 911.) 25 tablet 6  . NOVOLOG FLEXPEN 100 UNIT/ML FlexPen INJECT 6 UNITS INTO THE SKIN WITH EACH MEAL. MAY TITRATE UP 14 UNITS. (Patient taking differently: Inject 20 units SQ twice daily after meals) 15 mL 0  . polycarbophil (FIBERCON) 625 MG tablet Take 2 tablets (1,250 mg total) by mouth daily with lunch. Hold if you have diarrhea (Patient taking differently: Take 1,250 mg by mouth 3 (three) times daily. Hold if you have diarrhea) 30 tablet 0  . potassium chloride SA (K-DUR,KLOR-CON) 20 MEQ tablet 2 tablets in the am, one tablet midday and 2 tablets qpm (Patient taking differently: Take 20-40 mEq by mouth See admin instructions. Take 40 meq by mouth in the morning, and take 20 meq by mouth midday and take 40 meq by mouth in the evening) 450 tablet 0  . rosuvastatin (CRESTOR) 40 MG tablet Take 1 tablet (40 mg total) by mouth daily. 90 tablet 2  . sodium chloride (OCEAN) 0.65 % SOLN nasal spray Place 2 sprays into both nostrils 2  (two) times daily as needed for congestion.    . torsemide (DEMADEX) 20 MG tablet TAKE (2) TABLETS BY MOUTH TWICE DAILY. (Patient taking differently: TAKE 40 MG BY MOUTH TWICE DAILY.) 180 tablet 0  . trolamine salicylate (ASPERCREME) 10 % cream Apply 1 application topically 2 (two) times daily as needed for muscle pain.      No current facility-administered medications for this visit.     Allergies  Allergen Reactions  . Dyflex-G [Dyphylline-Guaifenesin] Hives      Review of Systems:   General:  decreased appetite, normal energy, + weight gain, no weight loss, no fever  Cardiac:  + chest pain with exertion, no chest pain at rest, + SOB with exertion, no resting SOB, no PND, no orthopnea, no palpitations, no arrhythmia, no atrial fibrillation, + LE edema, no dizzy spells, no syncope  Respiratory:  no shortness of breath, no home oxygen, no productive cough, no dry cough, no bronchitis, no wheezing, no hemoptysis, no asthma, no pain with inspiration or cough, + sleep apnea, no CPAP at night  GI:   no difficulty swallowing, no reflux, no frequent heartburn, no hiatal hernia, no abdominal pain, no constipation, no diarrhea, no hematochezia, no hematemesis, no melena  GU:   no dysuria,  no frequency, no urinary tract infection, no hematuria, no enlarged prostate, no kidney stones, no kidney disease  Vascular:  no pain suggestive of claudication, no pain in feet, no leg cramps, no varicose veins, no DVT, no non-healing foot ulcer  Neuro:   no stroke, no TIA's, no seizures, + headaches, no temporary blindness one eye,  no slurred speech, no peripheral neuropathy, mild chronic pain, no instability of gait, no memory/cognitive dysfunction  Musculoskeletal: + arthritis, no joint swelling, no myalgias, no difficulty walking, normal mobility   Skin:   no rash, no itching, no skin infections, no pressure sores or ulcerations  Psych:   no anxiety, no depression, no nervousness, no unusual recent  stress  Eyes:   no blurry vision, no floaters, no recent vision changes, does not wears glasses or contacts  ENT:   no hearing loss, no loose or painful teeth, no dentures  Hematologic:  no easy bruising, no abnormal bleeding, no clotting disorder, no frequent epistaxis  Endocrine:  + diabetes, does check CBG's at home           Physical Exam:   BP 114/71   Pulse 82  Resp 20   Ht 5' 10.5" (1.791 m)   Wt 280 lb (127 kg)   SpO2 98% Comment: RA  BMI 39.61 kg/m   General:  Obese male NAD   HEENT:  Unremarkable   Neck:   no JVD, no bruits, no adenopathy   Chest:   clear to auscultation, symmetrical breath sounds, no wheezes, no rhonchi   CV:   RRR, grade III/VI crescendo/decrescendo murmur heard best at RSB,  no diastolic murmur  Abdomen:  soft, non-tender, no masses   Extremities:  warm, well-perfused, pulses diminished, mild bilateral LE edema  Rectal/GU  Deferred  Neuro:   Grossly non-focal and symmetrical throughout  Skin:   Clean and dry, no rashes, no breakdown   Diagnostic Tests:  Transthoracic Echocardiography  Patient:    Matson, Welch MR #:       [id number] Study Date: 10/08/2017 Gender:     M Age:        31 Height:     179.1 cm Weight:     128.8 kg BSA:        2.59 m^2 Pt. Status: Room:   SONOGRAPHER  [facility]  ATTENDING    Kerry Hough, M.D.  Berna Spare, M.D.  REFERRING    Kerry Hough, M.D.  PERFORMING   Chmg, Forestine Na  cc:  ------------------------------------------------------------------- LV EF: 15% -   20%  ------------------------------------------------------------------- Indications:      Shortness of breath 786.05.  ------------------------------------------------------------------- History:   PMH:  Former Smoker, AICD (automatic cardioverter/defibrillator) present.  Dyspnea.  Coronary artery disease.  Congestive heart failure.  Risk factors:  Hypertension. Diabetes mellitus.  Dyslipidemia.  ------------------------------------------------------------------- Study Conclusions  - Left ventricle: The cavity size was moderately dilated. Wall   thickness was increased increased in a pattern of mild to   moderate LVH. The estimated ejection fraction was in the range of   15% to 20%. Diffuse hypokinesis. Doppler parameters are   consistent with restrictive physiology, indicative of decreased   left ventricular diastolic compliance and/or increased left   atrial pressure. Doppler parameters are consistent with high   ventricular filling pressure. - Aortic valve: Moderately calcified annulus. Trileaflet;   moderately thickened leaflets. Findings consistent with severe   low flow low gradient aortic stenosis. Mean gradient (S): 34 mm   Hg. VTI ratio of LVOT to aortic valve: 0.19. Valve area (VTI):   0.6 cm^2. Valve area (Vmax): 0.6 cm^2. Valve area (Vmean): 0.52   cm^2. - Mitral valve: Mildly calcified annulus. Mildly thickened leaflets   . - Left atrium: The atrium was severely dilated. - Right atrium: The atrium was mildly dilated. - Technically difficult study. Echocontrast was used to enhance   visualization. - No evidence of intracardiac thrombus.  ------------------------------------------------------------------- Study data:  Comparison was made to the study of 09/07/2017.  Study status:  Routine.  Procedure:  Transthoracic echocardiography. Image quality was adequate. The study was technically difficult, as a result of body habitus. Intravenous contrast (Definity) was administered.  Study completion:  There were no complications.     Transthoracic echocardiography.  M-mode, complete 2D, spectral Doppler, and color Doppler.  Birthdate:  Patient birthdate: 1951-06-27.  Age:  Patient is 67 yr old.  Sex:  Gender: male. BMI: 40.2 kg/m^2.  Blood pressure:     100/58  Patient status: Outpatient.  Study date:  Study date: 10/08/2017. Study time: 08:42 AM.   Location:  Echo laboratory.  -------------------------------------------------------------------  ------------------------------------------------------------------- Left ventricle:  The  cavity size was moderately dilated. Wall thickness was increased increased in a pattern of mild to moderate LVH. The estimated ejection fraction was in the range of 15% to 20%. Diffuse hypokinesis. Doppler parameters are consistent with restrictive physiology, indicative of decreased left ventricular diastolic compliance and/or increased left atrial pressure. Doppler parameters are consistent with high ventricular filling pressure.   ------------------------------------------------------------------- Aortic valve:   Moderately calcified annulus. Trileaflet; moderately thickened leaflets.  Doppler:  There was no regurgitation. Findings consistent with severe low flow low gradient aortic stenosis.    VTI ratio of LVOT to aortic valve: 0.19. Valve area (VTI): 0.6 cm^2. Indexed valve area (VTI): 0.23 cm^2/m^2. Peak velocity ratio of LVOT to aortic valve: 0.19. Valve area (Vmax): 0.6 cm^2. Indexed valve area (Vmax): 0.23 cm^2/m^2. Mean velocity ratio of LVOT to aortic valve: 0.16. Valve area (Vmean): 0.52 cm^2. Indexed valve area (Vmean): 0.2 cm^2/m^2. Mean gradient (S): 34 mm Hg. Peak gradient (S): 56 mm Hg.  ------------------------------------------------------------------- Aorta:  Aortic root: The aortic root was normal in size.  ------------------------------------------------------------------- Mitral valve:   Mildly calcified annulus. Mildly thickened leaflets .  Doppler:   There was no evidence for stenosis.   There was no significant regurgitation.    Peak gradient (D): 5 mm Hg.  ------------------------------------------------------------------- Left atrium:  The atrium was severely dilated.  ------------------------------------------------------------------- Atrial septum:  Poorly  visualized. No defect or patent foramen ovale was identified.  ------------------------------------------------------------------- Right ventricle:  Poorly visualized.  ------------------------------------------------------------------- Pulmonic valve:   Not well visualized.  Doppler:   There was no evidence for stenosis.   There was no significant regurgitation.   ------------------------------------------------------------------- Tricuspid valve:   Normal thickness leaflets.  Doppler:   There was no evidence for stenosis.   There was no significant regurgitation.   ------------------------------------------------------------------- Pulmonary artery:    Systolic pressure could not be accurately estimated.   Inadequate TR jet.  ------------------------------------------------------------------- Right atrium:  Poorly visualized. The atrium was mildly dilated.   ------------------------------------------------------------------- Pericardium:  There was no pericardial effusion.  ------------------------------------------------------------------- Systemic veins: Inferior vena cava: The vessel was normal in size. The respirophasic diameter changes were in the normal range (>= 50%), consistent with normal central venous pressure.  ------------------------------------------------------------------- Measurements   Left ventricle                           Value           Reference  LV ID, ED, PLAX chordal          (H)     68.4   mm       43 - 52  LV ID, ES, PLAX chordal          (H)     56.8   mm       23 - 38  LV fx shortening, PLAX chordal   (L)     17     %        >=29  LV PW thickness, ED                      12.6   mm       ---------  IVS/LV PW ratio, ED                      0.95            <=1.3    Ventricular septum  Value           Reference  IVS thickness, ED                        12     mm       ---------    LVOT                                      Value           Reference  LVOT ID, S                               20     mm       ---------  LVOT area                                3.14   cm^2     ---------  LVOT peak velocity, S                    69.97  cm/s     ---------  LVOT mean velocity, S                    45.3   cm/s     ---------  LVOT VTI, S                              16.27  cm       ---------  Stroke volume (SV), LVOT DP              51.1   ml       ---------  Stroke index (SV/bsa), LVOT DP           19.8   ml/m^2   ---------    Aortic valve                             Value           Reference  Aortic valve peak velocity, S            372.96 cm/s     ---------  Aortic valve mean velocity, S            275.06 cm/s     ---------  Aortic valve VTI, S                      87.17  cm       ---------  Aortic mean gradient, S                  34     mm Hg    ---------  Aortic peak gradient, S                  56     mm Hg    ---------  VTI ratio, LVOT/AV                       0.19            ---------  Aortic valve area, VTI  0.6    cm^2     ---------  Aortic valve area/bsa, VTI               0.23   cm^2/m^2 ---------  Velocity ratio, peak, LVOT/AV            0.19            ---------  Aortic valve area, peak velocity         0.6    cm^2     ---------  Aortic valve area/bsa, peak              0.23   cm^2/m^2 ---------  velocity  Velocity ratio, mean, LVOT/AV            0.16            ---------  Aortic valve area, mean velocity         0.52   cm^2     ---------  Aortic valve area/bsa, mean              0.2    cm^2/m^2 ---------  velocity    Aorta                                    Value           Reference  Aortic root ID, ED                       36     mm       ---------    Left atrium                              Value           Reference  LA ID, A-P, ES                           53     mm       ---------  LA volume/bsa, ES, 1-p A4C               41     ml/m^2   ---------    Mitral valve                              Value           Reference  Mitral E-wave peak velocity              116    cm/s     ---------  Mitral A-wave peak velocity              55.3   cm/s     ---------  Mitral deceleration time                 194    ms       150 - 230  Mitral peak gradient, D                  5      mm Hg    ---------  Mitral E/A ratio, peak                   2.1             ---------  Legend: (L)  and  (H)  mark values outside specified reference range.  ------------------------------------------------------------------- Prepared and Electronically Authenticated by  Kerry Hough, M.D. 2019-02-11T11:18:07   RIGHT/LEFT HEART CATH AND CORONARY/GRAFT ANGIOGRAPHY  Conclusion   1.  Severe three-vessel coronary artery disease with total occlusion of the RCA, total occlusion of the left circumflex, and total occlusion of the LAD just after the first diagonal 2.  Status post aortocoronary bypass surgery with patent grafts including LIMA to LAD, saphenous vein graft to ramus intermedius, saphenous vein graft to second obtuse marginal, and saphenous vein graft to right PDA 3.  Calcified, restricted aortic valve under plain fluoroscopy, with mean transvalvular gradient 27 mmHg  Recommendation continued evaluation for progressive aortic stenosis suspicious for low flow low gradient aortic stenosis, CT angiography of the heart and peripheral vessels will be ordered and multidisciplinary evaluation by cardiac surgery.  Indications   Severe aortic stenosis [I35.0 (ICD-10-CM)]  Procedural Details/Technique   Technical Details INDICATION: 67 year old with severe ischemic cardiomyopathy and progressive aortic stenosis suspicious for low flow low gradient aortic stenosis.  PROCEDURAL DETAILS: The right groin was prepped, draped, and anesthetized with 1% lidocaine. Using the modified Seldinger technique a 5 French sheath was placed in the right femoral artery and a 7 French sheath was placed in the  right femoral vein. Long sheaths were used. Access was obtained under ultrasound guidance. Ultrasound images were captured and stored in the patient's chart. A Swan-Ganz catheter was used for the right heart catheterization. Standard protocol was followed for recording of right heart pressures and sampling of oxygen saturations. Fick cardiac output was calculated. Standard Judkins catheters were used for selective coronary angiography, LIMA angiography, and saphenous vein graft angiography. The aortic valve was crossed with a JR4 catheter and a J-tip wire. Pullback gradient across the aortic valve is recorded. There were no immediate procedural complications. The patient was transferred to the post catheterization recovery area for further monitoring.    Estimated blood loss <50 mL.  During this procedure the patient was administered the following to achieve and maintain moderate conscious sedation: Versed 2 mg, Fentanyl 50 mcg, while the patient's heart rate, blood pressure, and oxygen saturation were continuously monitored. The period of conscious sedation was 58 minutes, of which I was present face-to-face 100% of this time.  Coronary Findings   Diagnostic  Dominance: Right  Left Anterior Descending  Prox LAD-1 lesion 50% stenosed  Prox LAD-1 lesion is 50% stenosed.  Prox LAD-2 lesion 100% stenosed  Prox LAD-2 lesion is 100% stenosed.  Left Circumflex  Prox Cx lesion 100% stenosed  Prox Cx lesion is 100% stenosed.  Right Coronary Artery  Prox RCA lesion 100% stenosed  Prox RCA lesion is 100% stenosed.  Prox RCA to Mid RCA lesion 100% stenosed  Prox RCA to Mid RCA lesion is 100% stenosed. The lesion was previously treated.  First Right Posterolateral  Collaterals  1st RPLB filled by collaterals from 1st Sept.    Free LIMA Graft to Mid LAD  LIMA graft was visualized by angiography and is normal in caliber. The graft exhibits no disease. The LIMA to LAD is widely patent without  obstruction. The vessel retrograde fills back to the proximal LAD which is totally occluded. The mid and distal LAD have no significant obstruction. The septal perforators of the LAD supplied collaterals to the right PDA.  Graft to Ramus  Prox Graft to Mid Graft lesion 50% stenosed  Prox Graft to Mid Graft lesion is 50% stenosed. The lesion  was previously treated. there is 50% stenosis at the proximal stent edge, unchanged from the previous cath study in 2016  Graft to RPDA  This graft is a sequential graft to the PLA, acute marginal, and PDA. The PDA target is the only vessel visualized.  Graft to 2nd Mrg  Intervention   No interventions have been documented.  Left Heart   Aortic Valve The aortic valve is calcified. There is restricted aortic valve motion. The peak to peak transaortic gradient is 29 mmHg, mean gradient 27 mmHg, calculated valve area 1.83 cm  Coronary Diagrams   Diagnostic Diagram       Implants     No implant documentation for this case.  MERGE Images   Show images for CARDIAC CATHETERIZATION   Link to Procedure Log   Procedure Log    Hemo Data    Most Recent Value  Fick Cardiac Output 6.94 L/min  Fick Cardiac Output Index 2.83 (L/min)/BSA  Aortic Mean Gradient 26.6 mmHg  Aortic Peak Gradient 29 mmHg  Aortic Valve Area 1.83  Aortic Value Area Index 0.75 cm2/BSA  RA A Wave 9 mmHg  RA V Wave 8 mmHg  RA Mean 7 mmHg  RV Systolic Pressure 37 mmHg  RV Diastolic Pressure 3 mmHg  RV EDP 9 mmHg  PA Systolic Pressure 39 mmHg  PA Diastolic Pressure 21 mmHg  PA Mean 28 mmHg  PW A Wave 17 mmHg  PW V Wave 12 mmHg  PW Mean 13 mmHg  AO Systolic Pressure 97 mmHg  AO Diastolic Pressure 66 mmHg  AO Mean 78 mmHg  LV Systolic Pressure 161 mmHg  LV Diastolic Pressure 5 mmHg  LV EDP 27 mmHg  Arterial Occlusion Pressure Extended Systolic Pressure 096 mmHg  Arterial Occlusion Pressure Extended Diastolic Pressure 67 mmHg  Arterial Occlusion Pressure Extended Mean  Pressure 81 mmHg  Left Ventricular Apex Extended Systolic Pressure 045 mmHg  Left Ventricular Apex Extended Diastolic Pressure 4 mmHg  Left Ventricular Apex Extended EDP Pressure 17 mmHg  QP/QS 1  TPVR Index 9.88 HRUI  TSVR Index 27.51 HRUI  PVR SVR Ratio 0.15  TPVR/TSVR Ratio 0.36    Cardiac TAVR CT  TECHNIQUE: The patient was scanned on a [employer]. A 120 kV retrospective scan was triggered in the descending thoracic aorta at 111 HU's. Gantry rotation speed was 250 msecs and collimation was .6 mm. No beta blockade or nitro were given. The 3D data set was reconstructed in 5% intervals of the R-R cycle. Systolic and diastolic phases were analyzed on a dedicated work station using MPR, MIP and VRT modes. The patient received 80 cc of contrast.  FINDINGS: Aortic Valve: Functionally bileaflet aortic valve with co-joined left and right coronary cusp. There are asymmetric severe calcifications of the non-coronary leaflet that are also extending into the LVOT.  Aorta: Normal size, moderate diffuse atherosclerosis and calcifications, no dissection.  Sinotubular Junction: 32 x 31 mm  Ascending Thoracic Aorta: Upper normal size measuring 40 x 38 mm.  Aortic Arch: 26 x 26 mm  Descending Thoracic Aorta: 24 x 24 mm  Sinus of Valsalva Measurements:  Non-coronary: 36 mm  Right -coronary: 34 mm  Left -coronary: 36 mm  Coronary Artery Height above Annulus:  Left Main: 11 mm  Right Coronary: 15 mm  Virtual Basal Annulus Measurements:  Maximum/Minimum Diameter: 28.0 x 24.  1 mm  Mean Diameter: 25.5 mm  Perimeter: 81.3  Area: 509 mm2  Optimum Fluoroscopic Angle for Delivery: LAO 11 CAU  8  IMPRESSION: 1. Functionally bileaflet aortic valve with co-joined left and right coronary cusp. There are asymmetric severe calcifications of the non-coronary leaflet that are also extending into the LVOT. Annular measurements suitable for delivery  of a 26 mm Edwards-SAPIEN 3 valve.  2. Sufficient right coronary to annulus distance, borderline left coronary to annulus distance (11 mm).  3. Optimum Fluoroscopic Angle for Delivery:  LAO 11 CAU 8  4. No thrombus in the left atrial appendage.   Electronically Signed   By: Ena Dawley   On: 10/17/2017 10:10   CT ANGIOGRAPHY CHEST, ABDOMEN AND PELVIS  TECHNIQUE: Multidetector CT imaging through the chest, abdomen and pelvis was performed using the standard protocol during bolus administration of intravenous contrast. Multiplanar reconstructed images and MIPs were obtained and reviewed to evaluate the vascular anatomy.  CONTRAST:  118mL ISOVUE-370 IOPAMIDOL (ISOVUE-370) INJECTION 76%  COMPARISON:  None.  FINDINGS: CTA CHEST FINDINGS  Cardiovascular: Heart size is mildly enlarged. There is no significant pericardial fluid, thickening or pericardial calcification. There is aortic atherosclerosis, as well as atherosclerosis of the great vessels of the mediastinum and the coronary arteries, including calcified atherosclerotic plaque in the left anterior descending and right coronary arteries. Status post median sternotomy for CABG including LIMA to the LAD.  Mediastinum/Lymph Nodes: No pathologically enlarged mediastinal or hilar lymph nodes. Esophagus is unremarkable in appearance. No axillary lymphadenopathy.  Lungs/Pleura: No suspicious appearing pulmonary nodules or masses. No acute consolidative airspace disease. No pleural effusions.  Musculoskeletal/Soft Tissues: Median sternotomy wires. There are no aggressive appearing lytic or blastic lesions noted in the visualized portions of the skeleton.  CTA ABDOMEN AND PELVIS FINDINGS  Hepatobiliary: Diffuse low attenuation throughout the hepatic parenchyma, compatible with known of steatosis no suspicious cystic or solid hepatic lesions. No intra or extrahepatic biliary ductal dilatation. 9 mm  calcified gallstone lying dependently in the gallbladder. No findings to suggest an acute cholecystitis at this time.  Pancreas: No pancreatic mass. No pancreatic ductal dilatation. No pancreatic or peripancreatic fluid or inflammatory changes.  Spleen: Unremarkable.  Adrenals/Urinary Tract: 1.6 cm exophytic simple cyst in the lower pole of left kidney. Right kidney is normal in appearance. No hydroureteronephrosis. Small amount of contrast material lying dependently in the urinary bladder, presumably from test injection. Urinary bladder is otherwise unremarkable in appearance. 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  Stomach/Bowel: Normal appearance of the stomach. No pathologic dilatation of small bowel or colon. Numerous colonic diverticulae are noted, without surrounding inflammatory changes to suggest an acute diverticulitis at this time. The appendix is not confidently identified and may be surgically absent. Regardless, there are no inflammatory changes noted adjacent to the cecum to suggest the presence of an acute appendicitis at this time.  Vascular/Lymphatic: Aortic atherosclerosis, with vascular findings and measurements pertinent to potential TAVR procedure, as detailed below. No aneurysm or dissection noted in the abdominal or pelvic vasculature. Celiac axis, superior mesenteric artery and inferior mesenteric artery are all widely patent without hemodynamically significant stenosis. Single right and 2 left renal arteries are all widely patent. No lymphadenopathy noted in the abdomen or pelvis.  Reproductive: Prostate gland and seminal vesicles are unremarkable in appearance.  Other: No significant volume of ascites.  No pneumoperitoneum.  Musculoskeletal: There are no aggressive appearing lytic or  blastic lesions noted in the visualized portions of the skeleton.  VASCULAR MEASUREMENTS PERTINENT TO TAVR:  AORTA:  Minimal Aortic Diameter-14 x 16 mm  Severity of Aortic Calcification-mild-to-moderate  RIGHT PELVIS:  Right Common Iliac Artery -  Minimal Diameter-12.0 x 8.7 mm  Tortuosity-mild  Calcification-moderate  Right External Iliac Artery -  Minimal Diameter-8.0 x 7.7 mm  Tortuosity-severe  Calcification-none  Right Common Femoral Artery -  Minimal Diameter-8.7 x 8.3 mm  Tortuosity-mild  Calcification-mild  LEFT PELVIS:  Left Common Iliac Artery -  Minimal Diameter-12.4 x 10.2 mm  Tortuosity-mild  Calcification-moderate  Left External Iliac Artery -  Minimal Diameter-8.7 x 7.9 mm  Tortuosity-severe  Calcification-none  Left Common Femoral Artery -  Minimal Diameter-9.3 x 9.9 mm  Tortuosity-mild  Calcification-mild  Review of the MIP images confirms the above findings.  IMPRESSION: 1. Vascular findings and measurements pertinent to potential TAVR procedure, as above. 2. Severe thickening calcification of the aortic valve, compatible with severe aortic stenosis. 3. Aortic atherosclerosis, in addition to three vessel coronary artery disease. Status post median sternotomy for CABG including LIMA to the LAD. 4. 3.1 x 4.0 cm right adrenal mass, incompletely characterized on today's examination. This does contain macroscopic lipid, and is similar in size to prior study from 09/15/2016, favored to be a benign lesions such as an adenoma or myelolipoma. 5. Additional incidental findings, as above.  Aortic Atherosclerosis (ICD10-I70.0).   Electronically Signed   By: Vinnie Langton M.D.   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  He has had several hospitalizations for acute exacerbation of chronic congestive heart failure, most recently approximately 1 year ago.  His current primary complaints consist of symptoms of stable exertional angina.  Symptoms of congestive heart failure seem to be under reasonably good control, New York Heart Association functional class I-II.  I have personally reviewed the patient's recent transthoracic echocardiogram, diagnostic cardiac catheterization, and CT angiograms.  Echocardiogram demonstrates the presence of severe left ventricular systolic dysfunction with ejection fraction estimated only 15%.  There is severe aortic stenosis.  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  The third leaflet moves somewhat better.  Peak velocity across aortic valve measured 3.8 m/s corresponding to mean transvalvular gradient estimated 37 mmHg in the setting of severe left ventricular systolic dysfunction.  Direct measurement of transvalvular gradient at the time of catheterization confirmed the presence of low flow low gradient severe aortic stenosis.  Catheterization also demonstrates essential stable findings with regards to the patient's underlying coronary artery disease with continued patency of 4 of the 6 bypass grafts placed at the time of the patient's original surgery in 2001.   Pulmonary artery pressures were mildly elevated.  I agree the patient would likely benefit from aortic valve replacement.  Risks associated  with conventional surgical aortic valve replacement with or without redo coronary artery bypass grafting would unquestionably be high.  I would not recommend conventional surgery and this patient with extremely reduced left ventricular systolic function.  Cardiac-gated CTA of the heart reveals anatomical characteristics consistent with aortic stenosis suitable for treatment by transcatheter aortic valve replacement without any significant complicating features and CTA of the aorta and iliac vessels demonstrate what appears to be adequate pelvic vascular access to facilitate a transfemoral approach.    Plan:  The patient and his wife were counseled at length regarding treatment alternatives for management of severe symptomatic aortic stenosis. Alternative approaches such as conventional aortic valve replacement with or without redo coronary artery bypass grafting, transcatheter aortic valve replacement, and continued medical therapy without intervention were compared and contrasted at length.  The risks associated with conventional surgical aortic valve replacement were discussed in detail, as were expectations for post-operative convalescence, and why I would be reluctant to consider this patient a candidate for conventional surgery.  Issues specific to transcatheter aortic valve replacement were discussed including questions about long term valve durability, the potential for paravalvular leak, possible increased risk of need for permanent pacemaker placement, and other technical complications related to the procedure itself.  Long-term prognosis with medical therapy was discussed. This discussion was placed in the context of the patient's own specific clinical presentation and past medical history.  All of their questions have been addressed.  The patient desires to  proceed with transcatheter aortic valve replacement in the near future.  We tentatively plan to proceed with surgery on November 20, 2017.  Following the decision to proceed with transcatheter aortic valve replacement, a discussion has been held regarding what types of management strategies would be attempted intraoperatively in the event of life-threatening complications, including whether or not the patient would be considered a candidate for the use of cardiopulmonary bypass and/or conversion to open sternotomy for attempted surgical intervention.  The patient has been advised of a variety of complications that might develop including but not limited to risks of death, stroke, paravalvular leak, aortic dissection or other major vascular complications, aortic annulus rupture, device embolization, cardiac rupture or perforation, mitral regurgitation, acute myocardial infarction, arrhythmia, heart block or bradycardia requiring permanent pacemaker placement, congestive heart failure, respiratory failure, renal failure, pneumonia, infection, other late complications related to structural valve deterioration or migration, or other complications that might ultimately cause a temporary or permanent loss of functional independence or other long term morbidity.  The patient provides full informed consent for the procedure as described and all questions were answered.   I spent in excess of 90 minutes during the conduct of this office consultation and >50% of this time involved direct face-to-face encounter with the patient for counseling and/or coordination of their care.    Valentina Gu. Roxy Manns, MD 10/24/2017 3:01 PM

## 2017-10-24 NOTE — Patient Instructions (Addendum)
  Continue all previous medications without any changes at this time   [name] [name] will contact you by phone in regards to scheduling additional TAVR appointments: carotid duplex, pulmonary function test and physical therapy evaluation  You are booked for Pre Admission Testing on Thursday, March 21st at 2:15 PM at [facility].  Please arrive in Admitting at 2:00 PM for check in.   We have rescheduled your follow-up appointment with Dr Cyndia Bent to Thursday, March 21st at 4:00 PM.

## 2017-10-24 NOTE — Progress Notes (Signed)
[facility]  CARDIOTHORACIC SURGERY CONSULTATION REPORT  Referring Provider is Branch, Alphonse Guild, MD PCP is Kathyrn Drown, MD  Chief Complaint  Patient presents with  . Aortic Stenosis    Surgical eval for TAVR, vs SAVR, review all studies    HPI:  Patient is a 67 year old morbidly obese male with history of coronary artery disease status post coronary artery bypass grafting in 2001, vein graft disease, 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  Patient's cardiac history dates back to 2001 he presented with an inferior wall myocardial infarction.  He was treated with PCI using a bare metal stent.  He developed restenosis requiring repeat angioplasty.  Ultimately he underwent coronary artery bypass grafting x6 in 2001 for unstable angina.  Grafts placed at the time of surgery included left internal mammary artery to the distal left anterior descending coronary artery, saphenous vein graft to the intermediate branch, sequential saphenous vein graft to the second obtuse marginal branch of the left circumflex and the posterior lateral branch off the distal right coronary artery, and sequential saphenous vein graft to the acute marginal branch and the posterior descending coronary artery is.  Patient's postoperative recovery was uneventful.  He did well for a prolonged period of time and was followed for many years by Dr. Lattie Haw.  He developed vein graft disease and underwent PCI and stenting of the saphenous vein graft to the ramus intermediate branch.  In 2015 echocardiogram  revealed severe left ventricular systolic dysfunction with ejection fraction estimated less than 15%.  Diagnostic catheterization performed at that time revealed severe native coronary artery disease with continued patency of left internal mammary artery to the distal left anterior descending coronary artery, patent saphenous vein graft to the obtuse marginal branch and the right posterior lateral branch, patent saphenous vein graft to the acute marginal with totally occluded limb to the posterior descending coronary artery, and patency of the stent in the saphenous vein graft to the ramus intermediate branch with eccentric 50-70% proximal stenosis in that vein graft.  He was treated medically.  In 2017 the patient was hospitalized with acute exacerbation of of congestive heart failure.  Follow-up echocardiogram performed at that time revealed left ventricular ejection fraction estimated 15%.  He was noted to have moderate aortic stenosis at that time with peak velocity across the aortic valve measured 2.9 m/s corresponding to mean transvalvular gradient estimated 22 mmHg.  The aortic valve leaflets were thickened but difficult to visualize.  Left ventricular ejection fraction did not improve with medical therapy.  He eventually underwent implantation of an ICD for primary prevention in September 2017.Marland Kitchen  His defibrillator has never fired.  In January 2018 he was hospitalized with another acute exacerbation of congestive heart failure.  He was diuresed 15 pounds with intravenous Lasix and clinically improved.  He was seen in follow-up recently by Dr. Harl Bowie and follow-up echocardiogram revealed severe left ventricular systolic dysfunction with significant progression in the severity of aortic stenosis.  Peak velocity across the aortic valve was reported 3.2 m/s corresponding to mean transvalvular gradient estimated 26 mmHg with DVI reported 0.18.  The distal apex of the left ventricle was not well visualized, and  mural thrombus  could not be ruled out.  The patient was referred to the multidisciplinary heart valve clinic and underwent left and right heart catheterization by Dr. Burt Knack on October 05, 2017.  Coronary angiography looks similar to previous catheterizations, with severe native coronary artery disease including total occlusion of the right coronary artery, left circumflex coronary artery and left anterior descending coronary artery.  There was continued patency of left internal mammary artery to the distal left anterior descending coronary artery, the saphenous vein graft to the ramus intermediate branch, saphenous vein graft to the obtuse marginal and posterolateral branch of the distal right coronary artery, and the saphenous vein graft to the posterior descending coronary artery.  Mean transvalvular gradient across the aortic valve was measured 27 mmHg, consistent with low flow, low gradient severe aortic stenosis.   Repeat transthoracic echocardiogram performed October 08, 2017 revealed persistent severe left ventricular systolic dysfunction with ejection fraction estimated 15%, but no sign of left ventricular mural thrombus.  Severity of aortic stenosis had progressed substantially with peak velocity across the aortic valve measured 3.7 m/s corresponding to mean transvalvular gradient estimated 34 mmHg.  The DVI was reported 0.19.  Patient subsequently underwent CT angiography and was referred for surgical consultation.  Patient is married and lives with his wife in [place].  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  He states that he still remains quite active physically, and he enjoys strenuous activity including heavy lifting.  He is a difficult historian.  He reports occasional episodes of substernal chest discomfort that are usually brought on with heavy lifting or other types of strenuous activity.  He has not had significant problems with his breathing recently.  He takes torsemide on a daily  basis and uses Zaroxolyn as needed when he gets problems with fluid overload.  He denies any PND, orthopnea, or lower extremity edema.  He has not had palpitations, dizzy spells, or syncope.  He has some chronic pain related to arthritis but he remains fairly mobile physically.   Past Medical History:  Diagnosis Date  . AICD (automatic cardioverter/defibrillator) present 05/18/2016  . Anginal pain ([place])    occ none recent  . Aortic stenosis   . Arthritis   . CAD (coronary artery disease)   . Cardiomyopathy, ischemic   . CHF (congestive heart failure) ([place])   . Chronic back pain   . Diabetes mellitus without complication ([place])   . Elevated PSA   . History of kidney stones   . Hyperlipidemia   . Morbid obesity ([place])   . Myocardial infarction ([place])   . OSA (obstructive sleep apnea)    does not wear CPAP, -order run out told need to redo  test  . 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 APPENDECTOMY    . CARDIAC CATHETERIZATION     stents  . CAROTID STENT     denies carotid stent  . CATARACT EXTRACTION, BILATERAL    . CORONARY ARTERY BYPASS GRAFT  12/23/1999   LIMA to LAD, SVG to ramus, sequential SVG to OM2-RPL, sequential SVG to AM-PDA  . EP IMPLANTABLE DEVICE N/A 05/18/2016   Procedure: ICD Implant;  Surgeon: Will Meredith Leeds, MD;  Location: [facility];  Service: Cardiovascular;  Laterality: N/A;  . EYE SURGERY    . ingrown toenail N/A   . LEFT HEART CATHETERIZATION WITH  CORONARY/GRAFT ANGIOGRAM N/A 12/21/2014   Procedure: LEFT HEART CATHETERIZATION WITH Beatrix Fetters;  Surgeon: Belva Crome, MD;  Location: [facility] C[facility] LAB;  Service: Cardiovascular;  Laterality: N/A;  . NASAL SEPTOPLASTY W/ TURBINOPLASTY Bilateral 04/25/2017   NASAL SEPTOPLASTY WITH TURBINATE REDUCTION/notes 04/25/2017  . NASAL SEPTOPLASTY W/ TURBINOPLASTY Bilateral  04/25/2017   Procedure: NASAL SEPTOPLASTY WITH TURBINATE REDUCTION;  Surgeon: Leta Baptist, MD;  Location: [facility];  Service: ENT;  Laterality: Bilateral;  . RIGHT/LEFT HEART CATH AND CORONARY/GRAFT ANGIOGRAPHY N/A 10/05/2017   Procedure: RIGHT/LEFT HEART CATH AND CORONARY/GRAFT ANGIOGRAPHY;  Surgeon: Sherren Mocha, MD;  Location: [facility];  Service: Cardiovascular;  Laterality: N/A;  . TONSILLECTOMY      Family History  Adopted: Yes  Problem Relation Age of Onset  . Diabetes Mother   . CAD Mother     Social History   Socioeconomic History  . Marital status: Married    Spouse name: Not on file  . Number of children: Not on file  . Years of education: Not on file  . Highest education level: Not on file  Social Needs  . Financial resource strain: Not on file  . Food insecurity - worry: Not on file  . Food insecurity - inability: Not on file  . Transportation needs - medical: Not on file  . Transportation needs - non-medical: Not on file  Occupational History  . Occupation: retired  Tobacco Use  . Smoking status: Former Smoker    Last attempt to quit: 08/29/1999    Years since quitting: 18.1  . Smokeless tobacco: Never Used  . Tobacco comment: couldn't remember date  Substance and Sexual Activity  . Alcohol use: No    Alcohol/week: 0.0 oz  . Drug use: No  . Sexual activity: Not on file  Other Topics Concern  . Not on file  Social History Narrative  . Not on file    Current Outpatient Medications  Medication Sig Dispense Refill  . aspirin-acetaminophen-caffeine (EXCEDRIN MIGRAINE) 250-250-65 MG tablet Take 2-3 tablets by mouth every 6 (six) hours as needed for headache.    . carvedilol (COREG) 6.25 MG tablet Take 1 tablet (6.25 mg total) by mouth 2 (two) times daily with a meal. 60 tablet 6  . clopidogrel (PLAVIX) 75 MG tablet Take 1 tablet (75 mg total) by mouth daily. 90 tablet 3  . HYDROcodone-acetaminophen (NORCO) 10-325 MG tablet 1 bid prn pain (Patient taking  differently: Take 2 tablets by mouth daily as needed for moderate pain. ) 60 tablet 0  . Insulin Detemir (LEVEMIR) 100 UNIT/ML Pen Inject 12 units into skin each evening. May titrate up to 60 units. (Patient taking differently: Inject 60 Units into the skin at bedtime. ) 15 mL 5  . liver oil-zinc oxide (DESITIN) 40 % ointment Apply 1 application topically as needed for irritation.    . metolazone (ZAROXOLYN) 2.5 MG tablet Take 2.5 mg by mouth See admin instructions. Take 2.5 mg by mouth once daily every 5 days    . Multiple Vitamins-Minerals (CENTRUM SILVER PO) Take 1 tablet by mouth daily.     . Naphazoline HCl (CLEAR EYES OP) Place 2 drops into both eyes daily as needed (for dry eyes. Gel and PF).    . nitroGLYCERIN (NITROSTAT) 0.4 MG SL tablet PLACE 1 TABLET UNDER TONGUE FOR CHEST PAIN. MAY REPEAT EVERY 5 MIN UPTO 3 DOSES-NO RELIEF,CALL 911. (Patient taking differently: Place 0.4 mg under the tongue every 5 (five) minutes as needed for chest pain.  UPTO 3 DOSES-NO RELIEF,CALL 911.) 25 tablet 6  . NOVOLOG FLEXPEN 100 UNIT/ML FlexPen INJECT 6 UNITS INTO THE SKIN WITH EACH MEAL. MAY TITRATE UP 14 UNITS. (Patient taking differently: Inject 20 units SQ twice daily after meals) 15 mL 0  . polycarbophil (FIBERCON) 625 MG tablet Take 2 tablets (1,250 mg total) by mouth daily with lunch. Hold if you have diarrhea (Patient taking differently: Take 1,250 mg by mouth 3 (three) times daily. Hold if you have diarrhea) 30 tablet 0  . potassium chloride SA (K-DUR,KLOR-CON) 20 MEQ tablet 2 tablets in the am, one tablet midday and 2 tablets qpm (Patient taking differently: Take 20-40 mEq by mouth See admin instructions. Take 40 meq by mouth in the morning, and take 20 meq by mouth midday and take 40 meq by mouth in the evening) 450 tablet 0  . rosuvastatin (CRESTOR) 40 MG tablet Take 1 tablet (40 mg total) by mouth daily. 90 tablet 2  . sodium chloride (OCEAN) 0.65 % SOLN nasal spray Place 2 sprays into both nostrils 2  (two) times daily as needed for congestion.    . torsemide (DEMADEX) 20 MG tablet TAKE (2) TABLETS BY MOUTH TWICE DAILY. (Patient taking differently: TAKE 40 MG BY MOUTH TWICE DAILY.) 180 tablet 0  . trolamine salicylate (ASPERCREME) 10 % cream Apply 1 application topically 2 (two) times daily as needed for muscle pain.      No current facility-administered medications for this visit.     Allergies  Allergen Reactions  . Dyflex-G [Dyphylline-Guaifenesin] Hives      Review of Systems:   General:  decreased appetite, normal energy, + weight gain, no weight loss, no fever  Cardiac:  + chest pain with exertion, no chest pain at rest, + SOB with exertion, no resting SOB, no PND, no orthopnea, no palpitations, no arrhythmia, no atrial fibrillation, + LE edema, no dizzy spells, no syncope  Respiratory:  no shortness of breath, no home oxygen, no productive cough, no dry cough, no bronchitis, no wheezing, no hemoptysis, no asthma, no pain with inspiration or cough, + sleep apnea, no CPAP at night  GI:   no difficulty swallowing, no reflux, no frequent heartburn, no hiatal hernia, no abdominal pain, no constipation, no diarrhea, no hematochezia, no hematemesis, no melena  GU:   no dysuria,  no frequency, no urinary tract infection, no hematuria, no enlarged prostate, no kidney stones, no kidney disease  Vascular:  no pain suggestive of claudication, no pain in feet, no leg cramps, no varicose veins, no DVT, no non-healing foot ulcer  Neuro:   no stroke, no TIA's, no seizures, + headaches, no temporary blindness one eye,  no slurred speech, no peripheral neuropathy, mild chronic pain, no instability of gait, no memory/cognitive dysfunction  Musculoskeletal: + arthritis, no joint swelling, no myalgias, no difficulty walking, normal mobility   Skin:   no rash, no itching, no skin infections, no pressure sores or ulcerations  Psych:   no anxiety, no depression, no nervousness, no unusual recent  stress  Eyes:   no blurry vision, no floaters, no recent vision changes, does not wears glasses or contacts  ENT:   no hearing loss, no loose or painful teeth, no dentures  Hematologic:  no easy bruising, no abnormal bleeding, no clotting disorder, no frequent epistaxis  Endocrine:  + diabetes, does check CBG's at home           Physical Exam:   BP 114/71   Pulse 82  Resp 20   Ht 5' 10.5" (1.791 m)   Wt 280 lb (127 kg)   SpO2 98% Comment: RA  BMI 39.61 kg/m   General:  Obese male NAD   HEENT:  Unremarkable   Neck:   no JVD, no bruits, no adenopathy   Chest:   clear to auscultation, symmetrical breath sounds, no wheezes, no rhonchi   CV:   RRR, grade III/VI crescendo/decrescendo murmur heard best at RSB,  no diastolic murmur  Abdomen:  soft, non-tender, no masses   Extremities:  warm, well-perfused, pulses diminished, mild bilateral LE edema  Rectal/GU  Deferred  Neuro:   Grossly non-focal and symmetrical throughout  Skin:   Clean and dry, no rashes, no breakdown   Diagnostic Tests:  Transthoracic Echocardiography  Patient:    [name] [name], [name] [name] MR #:       [id number] Study Date: 10/08/2017 Gender:     M Age:        52 Height:     179.1 cm Weight:     128.8 kg BSA:        2.59 m^2 Pt. Status: Room:   SONOGRAPHER  [facility]  ATTENDING    Kerry Hough, M.D.  Berna Spare, M.D.  REFERRING    Kerry Hough, M.D.  PERFORMING   [facility], [facility]  cc:  ------------------------------------------------------------------- LV EF: 15% -   20%  ------------------------------------------------------------------- Indications:      Shortness of breath 786.05.  ------------------------------------------------------------------- History:   PMH:  Former Smoker, AICD (automatic cardioverter/defibrillator) present.  Dyspnea.  Coronary artery disease.  Congestive heart failure.  Risk factors:  Hypertension. Diabetes mellitus.  Dyslipidemia.  ------------------------------------------------------------------- Study Conclusions  - Left ventricle: The cavity size was moderately dilated. Wall   thickness was increased increased in a pattern of mild to   moderate LVH. The estimated ejection fraction was in the range of   15% to 20%. Diffuse hypokinesis. Doppler parameters are   consistent with restrictive physiology, indicative of decreased   left ventricular diastolic compliance and/or increased left   atrial pressure. Doppler parameters are consistent with high   ventricular filling pressure. - Aortic valve: Moderately calcified annulus. Trileaflet;   moderately thickened leaflets. Findings consistent with severe   low flow low gradient aortic stenosis. Mean gradient (S): 34 mm   Hg. VTI ratio of LVOT to aortic valve: 0.19. Valve area (VTI):   0.6 cm^2. Valve area (Vmax): 0.6 cm^2. Valve area (Vmean): 0.52   cm^2. - Mitral valve: Mildly calcified annulus. Mildly thickened leaflets   . - Left atrium: The atrium was severely dilated. - Right atrium: The atrium was mildly dilated. - Technically difficult study. Echocontrast was used to enhance   visualization. - No evidence of intracardiac thrombus.  ------------------------------------------------------------------- Study data:  Comparison was made to the study of 09/07/2017.  Study status:  Routine.  Procedure:  Transthoracic echocardiography. Image quality was adequate. The study was technically difficult, as a result of body habitus. Intravenous contrast (Definity) was administered.  Study completion:  There were no complications.     Transthoracic echocardiography.  M-mode, complete 2D, spectral Doppler, and color Doppler.  Birthdate:  Patient birthdate: [birth year].  Age:  Patient is 67 yr old.  Sex:  Gender: male. BMI: 40.2 kg/m^2.  Blood pressure:     100/58  Patient status: Outpatient.  Study date:  Study date: 10/08/2017. Study time: 08:42 AM.   Location:  Echo laboratory.  -------------------------------------------------------------------  ------------------------------------------------------------------- Left ventricle:  The  cavity size was moderately dilated. Wall thickness was increased increased in a pattern of mild to moderate LVH. The estimated ejection fraction was in the range of 15% to 20%. Diffuse hypokinesis. Doppler parameters are consistent with restrictive physiology, indicative of decreased left ventricular diastolic compliance and/or increased left atrial pressure. Doppler parameters are consistent with high ventricular filling pressure.   ------------------------------------------------------------------- Aortic valve:   Moderately calcified annulus. Trileaflet; moderately thickened leaflets.  Doppler:  There was no regurgitation. Findings consistent with severe low flow low gradient aortic stenosis.    VTI ratio of LVOT to aortic valve: 0.19. Valve area (VTI): 0.6 cm^2. Indexed valve area (VTI): 0.23 cm^2/m^2. Peak velocity ratio of LVOT to aortic valve: 0.19. Valve area (Vmax): 0.6 cm^2. Indexed valve area (Vmax): 0.23 cm^2/m^2. Mean velocity ratio of LVOT to aortic valve: 0.16. Valve area (Vmean): 0.52 cm^2. Indexed valve area (Vmean): 0.2 cm^2/m^2. Mean gradient (S): 34 mm Hg. Peak gradient (S): 56 mm Hg.  ------------------------------------------------------------------- Aorta:  Aortic root: The aortic root was normal in size.  ------------------------------------------------------------------- Mitral valve:   Mildly calcified annulus. Mildly thickened leaflets .  Doppler:   There was no evidence for stenosis.   There was no significant regurgitation.    Peak gradient (D): 5 mm Hg.  ------------------------------------------------------------------- Left atrium:  The atrium was severely dilated.  ------------------------------------------------------------------- Atrial septum:  Poorly  visualized. No defect or patent foramen ovale was identified.  ------------------------------------------------------------------- Right ventricle:  Poorly visualized.  ------------------------------------------------------------------- Pulmonic valve:   Not well visualized.  Doppler:   There was no evidence for stenosis.   There was no significant regurgitation.   ------------------------------------------------------------------- Tricuspid valve:   Normal thickness leaflets.  Doppler:   There was no evidence for stenosis.   There was no significant regurgitation.   ------------------------------------------------------------------- Pulmonary artery:    Systolic pressure could not be accurately estimated.   Inadequate TR jet.  ------------------------------------------------------------------- Right atrium:  Poorly visualized. The atrium was mildly dilated.   ------------------------------------------------------------------- Pericardium:  There was no pericardial effusion.  ------------------------------------------------------------------- Systemic veins: Inferior vena cava: The vessel was normal in size. The respirophasic diameter changes were in the normal range (>= 50%), consistent with normal central venous pressure.  ------------------------------------------------------------------- Measurements   Left ventricle                           Value           Reference  LV ID, ED, PLAX chordal          (H)     68.4   mm       43 - 52  LV ID, ES, PLAX chordal          (H)     56.8   mm       23 - 38  LV fx shortening, PLAX chordal   (L)     17     %        >=29  LV PW thickness, ED                      12.6   mm       ---------  IVS/LV PW ratio, ED                      0.95            <=1.3    Ventricular septum  Value           Reference  IVS thickness, ED                        12     mm       ---------    LVOT                                      Value           Reference  LVOT ID, S                               20     mm       ---------  LVOT area                                3.14   cm^2     ---------  LVOT peak velocity, S                    69.97  cm/s     ---------  LVOT mean velocity, S                    45.3   cm/s     ---------  LVOT VTI, S                              16.27  cm       ---------  Stroke volume (SV), LVOT DP              51.1   ml       ---------  Stroke index (SV/bsa), LVOT DP           19.8   ml/m^2   ---------    Aortic valve                             Value           Reference  Aortic valve peak velocity, S            372.96 cm/s     ---------  Aortic valve mean velocity, S            275.06 cm/s     ---------  Aortic valve VTI, S                      87.17  cm       ---------  Aortic mean gradient, S                  34     mm Hg    ---------  Aortic peak gradient, S                  56     mm Hg    ---------  VTI ratio, LVOT/AV                       0.19            ---------  Aortic valve area, VTI  0.6    cm^2     ---------  Aortic valve area/bsa, VTI               0.23   cm^2/m^2 ---------  Velocity ratio, peak, LVOT/AV            0.19            ---------  Aortic valve area, peak velocity         0.6    cm^2     ---------  Aortic valve area/bsa, peak              0.23   cm^2/m^2 ---------  velocity  Velocity ratio, mean, LVOT/AV            0.16            ---------  Aortic valve area, mean velocity         0.52   cm^2     ---------  Aortic valve area/bsa, mean              0.2    cm^2/m^2 ---------  velocity    Aorta                                    Value           Reference  Aortic root ID, ED                       36     mm       ---------    Left atrium                              Value           Reference  LA ID, A-P, ES                           53     mm       ---------  LA volume/bsa, ES, 1-p A4C               41     ml/m^2   ---------    Mitral valve                              Value           Reference  Mitral E-wave peak velocity              116    cm/s     ---------  Mitral A-wave peak velocity              55.3   cm/s     ---------  Mitral deceleration time                 194    ms       150 - 230  Mitral peak gradient, D                  5      mm Hg    ---------  Mitral E/A ratio, peak                   2.1             ---------  Legend: (L)  and  (H)  mark values outside specified reference range.  ------------------------------------------------------------------- Prepared and Electronically Authenticated by  Kerry Hough, M.D. 2019-02-11T11:18:07   RIGHT/LEFT HEART CATH AND CORONARY/GRAFT ANGIOGRAPHY  Conclusion   1.  Severe three-vessel coronary artery disease with total occlusion of the RCA, total occlusion of the left circumflex, and total occlusion of the LAD just after the first diagonal 2.  Status post aortocoronary bypass surgery with patent grafts including LIMA to LAD, saphenous vein graft to ramus intermedius, saphenous vein graft to second obtuse marginal, and saphenous vein graft to right PDA 3.  Calcified, restricted aortic valve under plain fluoroscopy, with mean transvalvular gradient 27 mmHg  Recommendation continued evaluation for progressive aortic stenosis suspicious for low flow low gradient aortic stenosis, CT angiography of the heart and peripheral vessels will be ordered and multidisciplinary evaluation by cardiac surgery.  Indications   Severe aortic stenosis [I35.0 (ICD-10-CM)]  Procedural Details/Technique   Technical Details INDICATION: 67 year old with severe ischemic cardiomyopathy and progressive aortic stenosis suspicious for low flow low gradient aortic stenosis.  PROCEDURAL DETAILS: The right groin was prepped, draped, and anesthetized with 1% lidocaine. Using the modified Seldinger technique a 5 French sheath was placed in the right femoral artery and a 7 French sheath was placed in the  right femoral vein. Long sheaths were used. Access was obtained under ultrasound guidance. Ultrasound images were captured and stored in the patient's chart. A Swan-Ganz catheter was used for the right heart catheterization. Standard protocol was followed for recording of right heart pressures and sampling of oxygen saturations. Fick cardiac output was calculated. Standard Judkins catheters were used for selective coronary angiography, LIMA angiography, and saphenous vein graft angiography. The aortic valve was crossed with a JR4 catheter and a J-tip wire. Pullback gradient across the aortic valve is recorded. There were no immediate procedural complications. The patient was transferred to the post catheterization recovery area for further monitoring.    Estimated blood loss <50 mL.  During this procedure the patient was administered the following to achieve and maintain moderate conscious sedation: Versed 2 mg, Fentanyl 50 mcg, while the patient's heart rate, blood pressure, and oxygen saturation were continuously monitored. The period of conscious sedation was 58 minutes, of which I was present face-to-face 100% of this time.  Coronary Findings   Diagnostic  Dominance: Right  Left Anterior Descending  Prox LAD-1 lesion 50% stenosed  Prox LAD-1 lesion is 50% stenosed.  Prox LAD-2 lesion 100% stenosed  Prox LAD-2 lesion is 100% stenosed.  Left Circumflex  Prox Cx lesion 100% stenosed  Prox Cx lesion is 100% stenosed.  Right Coronary Artery  Prox RCA lesion 100% stenosed  Prox RCA lesion is 100% stenosed.  Prox RCA to Mid RCA lesion 100% stenosed  Prox RCA to Mid RCA lesion is 100% stenosed. The lesion was previously treated.  First Right Posterolateral  Collaterals  1st RPLB filled by collaterals from 1st Sept.    Free LIMA Graft to Mid LAD  LIMA graft was visualized by angiography and is normal in caliber. The graft exhibits no disease. The LIMA to LAD is widely patent without  obstruction. The vessel retrograde fills back to the proximal LAD which is totally occluded. The mid and distal LAD have no significant obstruction. The septal perforators of the LAD supplied collaterals to the right PDA.  Graft to Ramus  Prox Graft to Mid Graft lesion 50% stenosed  Prox Graft to Mid Graft lesion is 50% stenosed. The lesion  was previously treated. there is 50% stenosis at the proximal stent edge, unchanged from the previous cath study in 2016  Graft to RPDA  This graft is a sequential graft to the PLA, acute marginal, and PDA. The PDA target is the only vessel visualized.  Graft to 2nd Mrg  Intervention   No interventions have been documented.  Left Heart   Aortic Valve The aortic valve is calcified. There is restricted aortic valve motion. The peak to peak transaortic gradient is 29 mmHg, mean gradient 27 mmHg, calculated valve area 1.83 cm  Coronary Diagrams   Diagnostic Diagram       Implants     No implant documentation for this case.  MERGE Images   Show images for CARDIAC CATHETERIZATION   Link to Procedure Log   Procedure Log    Hemo Data    Most Recent Value  Fick Cardiac Output 6.94 L/min  Fick Cardiac Output Index 2.83 (L/min)/BSA  Aortic Mean Gradient 26.6 mmHg  Aortic Peak Gradient 29 mmHg  Aortic Valve Area 1.83  Aortic Value Area Index 0.75 cm2/BSA  RA A Wave 9 mmHg  RA V Wave 8 mmHg  RA Mean 7 mmHg  RV Systolic Pressure 37 mmHg  RV Diastolic Pressure 3 mmHg  RV EDP 9 mmHg  PA Systolic Pressure 39 mmHg  PA Diastolic Pressure 21 mmHg  PA Mean 28 mmHg  PW A Wave 17 mmHg  PW V Wave 12 mmHg  PW Mean 13 mmHg  AO Systolic Pressure 97 mmHg  AO Diastolic Pressure 66 mmHg  AO Mean 78 mmHg  LV Systolic Pressure 062 mmHg  LV Diastolic Pressure 5 mmHg  LV EDP 27 mmHg  Arterial Occlusion Pressure Extended Systolic Pressure 694 mmHg  Arterial Occlusion Pressure Extended Diastolic Pressure 67 mmHg  Arterial Occlusion Pressure Extended Mean  Pressure 81 mmHg  Left Ventricular Apex Extended Systolic Pressure 854 mmHg  Left Ventricular Apex Extended Diastolic Pressure 4 mmHg  Left Ventricular Apex Extended EDP Pressure 17 mmHg  QP/QS 1  TPVR Index 9.88 HRUI  TSVR Index 27.51 HRUI  PVR SVR Ratio 0.15  TPVR/TSVR Ratio 0.36    Cardiac TAVR CT  TECHNIQUE: The patient was scanned on a [employer]. A 120 kV retrospective scan was triggered in the descending thoracic aorta at 111 HU's. Gantry rotation speed was 250 msecs and collimation was .6 mm. No beta blockade or nitro were given. The 3D data set was reconstructed in 5% intervals of the R-R cycle. Systolic and diastolic phases were analyzed on a dedicated work station using MPR, MIP and VRT modes. The patient received 80 cc of contrast.  FINDINGS: Aortic Valve: Functionally bileaflet aortic valve with co-joined left and right coronary cusp. There are asymmetric severe calcifications of the non-coronary leaflet that are also extending into the LVOT.  Aorta: Normal size, moderate diffuse atherosclerosis and calcifications, no dissection.  Sinotubular Junction: 32 x 31 mm  Ascending Thoracic Aorta: Upper normal size measuring 40 x 38 mm.  Aortic Arch: 26 x 26 mm  Descending Thoracic Aorta: 24 x 24 mm  Sinus of Valsalva Measurements:  Non-coronary: 36 mm  Right -coronary: 34 mm  Left -coronary: 36 mm  Coronary Artery Height above Annulus:  Left Main: 11 mm  Right Coronary: 15 mm  Virtual Basal Annulus Measurements:  Maximum/Minimum Diameter: 28.0 x 24.  1 mm  Mean Diameter: 25.5 mm  Perimeter: 81.3  Area: 509 mm2  Optimum Fluoroscopic Angle for Delivery: LAO 11 CAU  8  IMPRESSION: 1. Functionally bileaflet aortic valve with co-joined left and right coronary cusp. There are asymmetric severe calcifications of the non-coronary leaflet that are also extending into the LVOT. Annular measurements suitable for delivery  of a 26 mm Edwards-SAPIEN 3 valve.  2. Sufficient right coronary to annulus distance, borderline left coronary to annulus distance (11 mm).  3. Optimum Fluoroscopic Angle for Delivery:  LAO 11 CAU 8  4. No thrombus in the left atrial appendage.   Electronically Signed   By: Ena Dawley   On: 10/17/2017 10:10   CT ANGIOGRAPHY CHEST, ABDOMEN AND PELVIS  TECHNIQUE: Multidetector CT imaging through the chest, abdomen and pelvis was performed using the standard protocol during bolus administration of intravenous contrast. Multiplanar reconstructed images and MIPs were obtained and reviewed to evaluate the vascular anatomy.  CONTRAST:  132mL ISOVUE-370 IOPAMIDOL (ISOVUE-370) INJECTION 76%  COMPARISON:  None.  FINDINGS: CTA CHEST FINDINGS  Cardiovascular: Heart size is mildly enlarged. There is no significant pericardial fluid, thickening or pericardial calcification. There is aortic atherosclerosis, as well as atherosclerosis of the great vessels of the mediastinum and the coronary arteries, including calcified atherosclerotic plaque in the left anterior descending and right coronary arteries. Status post median sternotomy for CABG including LIMA to the LAD.  Mediastinum/Lymph Nodes: No pathologically enlarged mediastinal or hilar lymph nodes. Esophagus is unremarkable in appearance. No axillary lymphadenopathy.  Lungs/Pleura: No suspicious appearing pulmonary nodules or masses. No acute consolidative airspace disease. No pleural effusions.  Musculoskeletal/Soft Tissues: Median sternotomy wires. There are no aggressive appearing lytic or blastic lesions noted in the visualized portions of the skeleton.  CTA ABDOMEN AND PELVIS FINDINGS  Hepatobiliary: Diffuse low attenuation throughout the hepatic parenchyma, compatible with known of steatosis no suspicious cystic or solid hepatic lesions. No intra or extrahepatic biliary ductal dilatation. 9 mm  calcified gallstone lying dependently in the gallbladder. No findings to suggest an acute cholecystitis at this time.  Pancreas: No pancreatic mass. No pancreatic ductal dilatation. No pancreatic or peripancreatic fluid or inflammatory changes.  Spleen: Unremarkable.  Adrenals/Urinary Tract: 1.6 cm exophytic simple cyst in the lower pole of left kidney. Right kidney is normal in appearance. No hydroureteronephrosis. Small amount of contrast material lying dependently in the urinary bladder, presumably from test injection. Urinary bladder is otherwise unremarkable in appearance. 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  Stomach/Bowel: Normal appearance of the stomach. No pathologic dilatation of small bowel or colon. Numerous colonic diverticulae are noted, without surrounding inflammatory changes to suggest an acute diverticulitis at this time. The appendix is not confidently identified and may be surgically absent. Regardless, there are no inflammatory changes noted adjacent to the cecum to suggest the presence of an acute appendicitis at this time.  Vascular/Lymphatic: Aortic atherosclerosis, with vascular findings and measurements pertinent to potential TAVR procedure, as detailed below. No aneurysm or dissection noted in the abdominal or pelvic vasculature. Celiac axis, superior mesenteric artery and inferior mesenteric artery are all widely patent without hemodynamically significant stenosis. Single right and 2 left renal arteries are all widely patent. No lymphadenopathy noted in the abdomen or pelvis.  Reproductive: Prostate gland and seminal vesicles are unremarkable in appearance.  Other: No significant volume of ascites.  No pneumoperitoneum.  Musculoskeletal: There are no aggressive appearing lytic or  blastic lesions noted in the visualized portions of the skeleton.  VASCULAR MEASUREMENTS PERTINENT TO TAVR:  AORTA:  Minimal Aortic Diameter-14 x 16 mm  Severity of Aortic Calcification-mild-to-moderate  RIGHT PELVIS:  Right Common Iliac Artery -  Minimal Diameter-12.0 x 8.7 mm  Tortuosity-mild  Calcification-moderate  Right External Iliac Artery -  Minimal Diameter-8.0 x 7.7 mm  Tortuosity-severe  Calcification-none  Right Common Femoral Artery -  Minimal Diameter-8.7 x 8.3 mm  Tortuosity-mild  Calcification-mild  LEFT PELVIS:  Left Common Iliac Artery -  Minimal Diameter-12.4 x 10.2 mm  Tortuosity-mild  Calcification-moderate  Left External Iliac Artery -  Minimal Diameter-8.7 x 7.9 mm  Tortuosity-severe  Calcification-none  Left Common Femoral Artery -  Minimal Diameter-9.3 x 9.9 mm  Tortuosity-mild  Calcification-mild  Review of the MIP images confirms the above findings.  IMPRESSION: 1. Vascular findings and measurements pertinent to potential TAVR procedure, as above. 2. Severe thickening calcification of the aortic valve, compatible with severe aortic stenosis. 3. Aortic atherosclerosis, in addition to three vessel coronary artery disease. Status post median sternotomy for CABG including LIMA to the LAD. 4. 3.1 x 4.0 cm right adrenal mass, incompletely characterized on today's examination. This does contain macroscopic lipid, and is similar in size to prior study from 09/15/2016, favored to be a benign lesions such as an adenoma or myelolipoma. 5. Additional incidental findings, as above.  Aortic Atherosclerosis (ICD10-I70.0).   Electronically Signed   By: Vinnie Langton M.D.   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  He has had several hospitalizations for acute exacerbation of chronic congestive heart failure, most recently approximately 1 year ago.  His current primary complaints consist of symptoms of stable exertional angina.  Symptoms of congestive heart failure seem to be under reasonably good control, New York Heart Association functional class I-II.  I have personally reviewed the patient's recent transthoracic echocardiogram, diagnostic cardiac catheterization, and CT angiograms.  Echocardiogram demonstrates the presence of severe left ventricular systolic dysfunction with ejection fraction estimated only 15%.  There is severe aortic stenosis.  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  The third leaflet moves somewhat better.  Peak velocity across aortic valve measured 3.8 m/s corresponding to mean transvalvular gradient estimated 37 mmHg in the setting of severe left ventricular systolic dysfunction.  Direct measurement of transvalvular gradient at the time of catheterization confirmed the presence of low flow low gradient severe aortic stenosis.  Catheterization also demonstrates essential stable findings with regards to the patient's underlying coronary artery disease with continued patency of 4 of the 6 bypass grafts placed at the time of the patient's original surgery in 2001.   Pulmonary artery pressures were mildly elevated.  I agree the patient would likely benefit from aortic valve replacement.  Risks associated  with conventional surgical aortic valve replacement with or without redo coronary artery bypass grafting would unquestionably be high.  I would not recommend conventional surgery and this patient with extremely reduced left ventricular systolic function.  Cardiac-gated CTA of the heart reveals anatomical characteristics consistent with aortic stenosis suitable for treatment by transcatheter aortic valve replacement without any significant complicating features and CTA of the aorta and iliac vessels demonstrate what appears to be adequate pelvic vascular access to facilitate a transfemoral approach.    Plan:  The patient and his wife were counseled at length regarding treatment alternatives for management of severe symptomatic aortic stenosis. Alternative approaches such as conventional aortic valve replacement with or without redo coronary artery bypass grafting, transcatheter aortic valve replacement, and continued medical therapy without intervention were compared and contrasted at length.  The risks associated with conventional surgical aortic valve replacement were discussed in detail, as were expectations for post-operative convalescence, and why I would be reluctant to consider this patient a candidate for conventional surgery.  Issues specific to transcatheter aortic valve replacement were discussed including questions about long term valve durability, the potential for paravalvular leak, possible increased risk of need for permanent pacemaker placement, and other technical complications related to the procedure itself.  Long-term prognosis with medical therapy was discussed. This discussion was placed in the context of the patient's own specific clinical presentation and past medical history.  All of their questions have been addressed.  The patient desires to  proceed with transcatheter aortic valve replacement in the near future.  We tentatively plan to proceed with surgery on November 20, 2017.  Following the decision to proceed with transcatheter aortic valve replacement, a discussion has been held regarding what types of management strategies would be attempted intraoperatively in the event of life-threatening complications, including whether or not the patient would be considered a candidate for the use of cardiopulmonary bypass and/or conversion to open sternotomy for attempted surgical intervention.  The patient has been advised of a variety of complications that might develop including but not limited to risks of death, stroke, paravalvular leak, aortic dissection or other major vascular complications, aortic annulus rupture, device embolization, cardiac rupture or perforation, mitral regurgitation, acute myocardial infarction, arrhythmia, heart block or bradycardia requiring permanent pacemaker placement, congestive heart failure, respiratory failure, renal failure, pneumonia, infection, other late complications related to structural valve deterioration or migration, or other complications that might ultimately cause a temporary or permanent loss of functional independence or other long term morbidity.  The patient provides full informed consent for the procedure as described and all questions were answered.   I spent in excess of 90 minutes during the conduct of this office consultation and >50% of this time involved direct face-to-face encounter with the patient for counseling and/or coordination of their care.    Valentina Gu. Roxy Manns, MD 10/24/2017 3:01 PM

## 2017-10-25 ENCOUNTER — Other Ambulatory Visit: Payer: Self-pay

## 2017-10-25 DIAGNOSIS — I350 Nonrheumatic aortic (valve) stenosis: Secondary | ICD-10-CM

## 2017-10-26 ENCOUNTER — Other Ambulatory Visit: Payer: Self-pay

## 2017-10-26 DIAGNOSIS — I350 Nonrheumatic aortic (valve) stenosis: Secondary | ICD-10-CM

## 2017-10-31 ENCOUNTER — Encounter: Payer: Medicare Other | Admitting: Surgery

## 2017-11-05 ENCOUNTER — Other Ambulatory Visit: Payer: Self-pay | Admitting: Family Medicine

## 2017-11-10 ENCOUNTER — Other Ambulatory Visit: Payer: Self-pay | Admitting: Cardiology

## 2017-11-14 NOTE — Pre-Procedure Instructions (Addendum)
[name] [name]  11/14/2017      [address] [place], [place] [address] Phone: [phone] Fax: [phone]    Your procedure is scheduled on Tues., November 20, 2017  Report to [facility] Admitting Entrance "A" at 5:30AM  Call this number if you have problems the morning of surgery:  [phone]   Remember:  Do not eat food or drink liquids after midnight.  Take these medicines the morning of surgery with A SIP OF WATER: NONE  Follow your doctor's instruction regarding Aspirin and Plavix.  As of today, stop taking all Aspirins, Vitamins, Fish oils, and Herbal medications. Also stop all NSAIDS i.e. Advil, Ibuprofen, Motrin, Aleve, Anaprox, Naproxen, BC and Goody Powders.  How to Manage Your Diabetes Before and After Surgery  Why is it important to control my blood sugar before and after surgery? . Improving blood sugar levels before and after surgery helps healing and can limit problems. . A way of improving blood sugar control is eating a healthy diet by: o  Eating less sugar and carbohydrates o  Increasing activity/exercise o  Talking with your doctor about reaching your blood sugar goals . High blood sugars (greater than 180 mg/dL) can raise your risk of infections and slow your recovery, so you will need to focus on controlling your diabetes during the weeks before surgery. . Make sure that the doctor who takes care of your diabetes knows about your planned surgery including the date and location.  How do I manage my blood sugar before surgery? . Check your blood sugar at least 4 times a day, starting 2 days before surgery, to make sure that the level is not too high or low. o Check your blood sugar the morning of your surgery when you wake up and every 2 hours until you get to the Short Stay unit. . If your blood sugar is less than 70 mg/dL, you will need to treat for low blood sugar: o Do not take  insulin. o Treat a low blood sugar (less than 70 mg/dL) with  cup of clear juice (cranberry or apple), 4 glucose tablets, OR glucose gel. Recheck blood sugar in 15 minutes after treatment (to make sure it is greater than 70 mg/dL). If your blood sugar is not greater than 70 mg/dL on recheck, call [phone] o  for further instructions. . Report your blood sugar to the short stay nurse when you get to Short Stay.  . If you are admitted to the hospital after surgery: o Your blood sugar will be checked by the staff and you will probably be given insulin after surgery (instead of oral diabetes medicines) to make sure you have good blood sugar levels. o The goal for blood sugar control after surgery is 80-180 mg/dL.  WHAT DO I DO ABOUT MY DIABETES MEDICATION?  . THE NIGHT BEFORE SURGERY, take ___30________ units of ___Levemir________insulin.      . If your CBG is greater than 220 mg/dL, you may take  of your sliding scale (correction) dose of insulin.   Do not wear jewelry.  Do not wear lotions, powders, colognes, or deodorant.  Do not shave 48 hours prior to surgery.  Men may shave face.  Do not bring valuables to the hospital.  [facility] is not responsible for any belongings or valuables.  Contacts, dentures or bridgework may not be worn into surgery.  Leave your suitcase in the  car.  After surgery it may be brought to your room.  For patients admitted to the hospital, discharge time will be determined by your treatment team.  Patients discharged the day of surgery will not be allowed to drive home.    Special instructions:  [facility]- Preparing For Surgery  Before surgery, you can play an important role. Because skin is not sterile, your skin needs to be as free of germs as possible. You can reduce the number of germs on your skin by washing with CHG (chlorahexidine gluconate) Soap before surgery.  CHG is an antiseptic cleaner which kills germs and bonds with the skin to continue  killing germs even after washing.  Please do not use if you have an allergy to CHG or antibacterial soaps. If your skin becomes reddened/irritated stop using the CHG.  Do not shave (including legs and underarms) for at least 48 hours prior to first CHG shower. It is OK to shave your face.  Please follow these instructions carefully.   1. Shower the NIGHT BEFORE SURGERY and the MORNING OF SURGERY with CHG.   2. If you chose to wash your hair, wash your hair first as usual with your normal shampoo.  3. After you shampoo, rinse your hair and body thoroughly to remove the shampoo.  4. Use CHG as you would any other liquid soap. You can apply CHG directly to the skin and wash gently with a scrungie or a clean washcloth.   5. Apply the CHG Soap to your body ONLY FROM THE NECK DOWN.  Do not use on open wounds or open sores. Avoid contact with your eyes, ears, mouth and genitals (private parts). Wash Face and genitals (private parts)  with your normal soap.  6. Wash thoroughly, paying special attention to the area where your surgery will be performed.  7. Thoroughly rinse your body with warm water from the neck down.  8. DO NOT shower/wash with your normal soap after using and rinsing off the CHG Soap.  9. Pat yourself dry with a CLEAN TOWEL.  10. Wear CLEAN PAJAMAS to bed the night before surgery, wear comfortable clothes the morning of surgery  11. Place CLEAN SHEETS on your bed the night of your first shower and DO NOT SLEEP WITH PETS.  Day of Surgery: Do not apply any deodorants/lotions. Please wear clean clothes to the hospital/surgery center.    Please read over the following fact sheets that you were given. Pain Booklet, Coughing and Deep Breathing, Blood Transfusion Information, MRSA Information and Surgical Site Infection Prevention

## 2017-11-15 ENCOUNTER — Other Ambulatory Visit: Payer: Self-pay

## 2017-11-15 ENCOUNTER — Encounter (HOSPITAL_COMMUNITY)
Admission: RE | Admit: 2017-11-15 | Discharge: 2017-11-15 | Disposition: A | Discharge status: Home / Self Care | Payer: Medicare Other | Source: Ambulatory Visit | Attending: Cardiovascular Disease | Admitting: Cardiovascular Disease

## 2017-11-15 ENCOUNTER — Ambulatory Visit (HOSPITAL_COMMUNITY)
Admission: RE | Admit: 2017-11-15 | Discharge: 2017-11-15 | Disposition: A | Discharge status: Home / Self Care | Payer: Medicare Other | Source: Ambulatory Visit | Attending: Cardiovascular Disease | Admitting: Cardiovascular Disease

## 2017-11-15 ENCOUNTER — Encounter (HOSPITAL_COMMUNITY): Payer: Self-pay

## 2017-11-15 ENCOUNTER — Institutional Professional Consult (permissible substitution) (INDEPENDENT_AMBULATORY_CARE_PROVIDER_SITE_OTHER): Payer: Medicare Other | Admitting: Surgery

## 2017-11-15 ENCOUNTER — Ambulatory Visit: Payer: Medicare Other | Attending: Cardiovascular Disease | Admitting: Physical Therapy

## 2017-11-15 ENCOUNTER — Encounter: Payer: Self-pay | Admitting: Physical Therapy

## 2017-11-15 ENCOUNTER — Encounter: Payer: Self-pay | Admitting: Surgery

## 2017-11-15 VITALS — BP 132/80 | HR 86 | Resp 18 | Ht 70.5 in | Wt 289.4 lb

## 2017-11-15 DIAGNOSIS — N183 Chronic kidney disease, stage 3 (moderate): Secondary | ICD-10-CM | POA: Diagnosis not present

## 2017-11-15 DIAGNOSIS — E1122 Type 2 diabetes mellitus with diabetic chronic kidney disease: Secondary | ICD-10-CM | POA: Diagnosis not present

## 2017-11-15 DIAGNOSIS — I350 Nonrheumatic aortic (valve) stenosis: Secondary | ICD-10-CM

## 2017-11-15 DIAGNOSIS — Z7982 Long term (current) use of aspirin: Secondary | ICD-10-CM | POA: Diagnosis not present

## 2017-11-15 DIAGNOSIS — I440 Atrioventricular block, first degree: Secondary | ICD-10-CM | POA: Diagnosis not present

## 2017-11-15 DIAGNOSIS — J449 Chronic obstructive pulmonary disease, unspecified: Secondary | ICD-10-CM | POA: Insufficient documentation

## 2017-11-15 DIAGNOSIS — Z95810 Presence of automatic (implantable) cardiac defibrillator: Secondary | ICD-10-CM | POA: Insufficient documentation

## 2017-11-15 DIAGNOSIS — R262 Difficulty in walking, not elsewhere classified: Secondary | ICD-10-CM | POA: Insufficient documentation

## 2017-11-15 DIAGNOSIS — Z951 Presence of aortocoronary bypass graft: Secondary | ICD-10-CM | POA: Insufficient documentation

## 2017-11-15 DIAGNOSIS — I2510 Atherosclerotic heart disease of native coronary artery without angina pectoris: Secondary | ICD-10-CM | POA: Insufficient documentation

## 2017-11-15 DIAGNOSIS — Z01818 Encounter for other preprocedural examination: Secondary | ICD-10-CM | POA: Diagnosis not present

## 2017-11-15 DIAGNOSIS — I255 Ischemic cardiomyopathy: Secondary | ICD-10-CM

## 2017-11-15 DIAGNOSIS — I130 Hypertensive heart and chronic kidney disease with heart failure and stage 1 through stage 4 chronic kidney disease, or unspecified chronic kidney disease: Secondary | ICD-10-CM | POA: Diagnosis not present

## 2017-11-15 DIAGNOSIS — Z794 Long term (current) use of insulin: Secondary | ICD-10-CM | POA: Insufficient documentation

## 2017-11-15 DIAGNOSIS — G4733 Obstructive sleep apnea (adult) (pediatric): Secondary | ICD-10-CM | POA: Diagnosis not present

## 2017-11-15 DIAGNOSIS — I509 Heart failure, unspecified: Secondary | ICD-10-CM | POA: Insufficient documentation

## 2017-11-15 DIAGNOSIS — Z01810 Encounter for preprocedural cardiovascular examination: Secondary | ICD-10-CM | POA: Diagnosis not present

## 2017-11-15 DIAGNOSIS — I252 Old myocardial infarction: Secondary | ICD-10-CM | POA: Diagnosis not present

## 2017-11-15 DIAGNOSIS — Z87891 Personal history of nicotine dependence: Secondary | ICD-10-CM | POA: Diagnosis not present

## 2017-11-15 DIAGNOSIS — I25810 Atherosclerosis of coronary artery bypass graft(s) without angina pectoris: Secondary | ICD-10-CM | POA: Diagnosis not present

## 2017-11-15 DIAGNOSIS — E785 Hyperlipidemia, unspecified: Secondary | ICD-10-CM | POA: Diagnosis not present

## 2017-11-15 DIAGNOSIS — Z7902 Long term (current) use of antithrombotics/antiplatelets: Secondary | ICD-10-CM | POA: Insufficient documentation

## 2017-11-15 DIAGNOSIS — Z79899 Other long term (current) drug therapy: Secondary | ICD-10-CM | POA: Diagnosis not present

## 2017-11-15 LAB — URINALYSIS, ROUTINE W REFLEX MICROSCOPIC
Bacteria, UA: NONE SEEN
Bilirubin Urine: NEGATIVE
Glucose, UA: 150 mg/dL — AB
Hgb urine dipstick: NEGATIVE
KETONES UR: NEGATIVE mg/dL
Leukocytes, UA: NEGATIVE
NITRITE: NEGATIVE
PH: 7 (ref 5.0–8.0)
Protein, ur: 30 mg/dL — AB
SPECIFIC GRAVITY, URINE: 1.019 (ref 1.005–1.030)

## 2017-11-15 LAB — PULMONARY FUNCTION TEST
DL/VA % PRED: 99 %
DL/VA: 4.62 ml/min/mmHg/L
DLCO COR % PRED: 64 %
DLCO UNC % PRED: 58 %
DLCO cor: 21.31 ml/min/mmHg
DLCO unc: 19.26 ml/min/mmHg
FEF 25-75 POST: 2.85 L/s
FEF 25-75 Pre: 1.75 L/sec
FEF2575-%CHANGE-POST: 62 %
FEF2575-%Pred-Post: 107 %
FEF2575-%Pred-Pre: 65 %
FEV1-%CHANGE-POST: 14 %
FEV1-%Pred-Post: 67 %
FEV1-%Pred-Pre: 59 %
FEV1-PRE: 2.02 L
FEV1-Post: 2.32 L
FEV1FVC-%CHANGE-POST: 3 %
FEV1FVC-%Pred-Pre: 103 %
FEV6-%Change-Post: 10 %
FEV6-%Pred-Post: 66 %
FEV6-%Pred-Pre: 60 %
FEV6-PRE: 2.63 L
FEV6-Post: 2.9 L
FEV6FVC-%Change-Post: 0 %
FEV6FVC-%PRED-PRE: 105 %
FEV6FVC-%Pred-Post: 104 %
FVC-%CHANGE-POST: 10 %
FVC-%Pred-Post: 63 %
FVC-%Pred-Pre: 57 %
FVC-PRE: 2.64 L
FVC-Post: 2.92 L
POST FEV1/FVC RATIO: 80 %
PRE FEV6/FVC RATIO: 100 %
Post FEV6/FVC ratio: 99 %
Pre FEV1/FVC ratio: 77 %
RV % PRED: 113 %
RV: 2.75 L
TLC % pred: 77 %
TLC: 5.49 L

## 2017-11-15 LAB — COMPREHENSIVE METABOLIC PANEL
ALK PHOS: 68 U/L (ref 38–126)
ALT: 42 U/L (ref 17–63)
ANION GAP: 12 (ref 5–15)
AST: 42 U/L — ABNORMAL HIGH (ref 15–41)
Albumin: 3.5 g/dL (ref 3.5–5.0)
BILIRUBIN TOTAL: 0.8 mg/dL (ref 0.3–1.2)
BUN: 11 mg/dL (ref 6–20)
CO2: 22 mmol/L (ref 22–32)
CREATININE: 1 mg/dL (ref 0.61–1.24)
Calcium: 8.4 mg/dL — ABNORMAL LOW (ref 8.9–10.3)
Chloride: 103 mmol/L (ref 101–111)
Glucose, Bld: 275 mg/dL — ABNORMAL HIGH (ref 65–99)
Potassium: 3.8 mmol/L (ref 3.5–5.1)
Sodium: 137 mmol/L (ref 135–145)
TOTAL PROTEIN: 7.3 g/dL (ref 6.5–8.1)

## 2017-11-15 LAB — CBC
HEMATOCRIT: 42.3 % (ref 39.0–52.0)
HEMOGLOBIN: 14.3 g/dL (ref 13.0–17.0)
MCH: 32.3 pg (ref 26.0–34.0)
MCHC: 33.8 g/dL (ref 30.0–36.0)
MCV: 95.5 fL (ref 78.0–100.0)
Platelets: 181 10*3/uL (ref 150–400)
RBC: 4.43 MIL/uL (ref 4.22–5.81)
RDW: 14.7 % (ref 11.5–15.5)
WBC: 8.7 10*3/uL (ref 4.0–10.5)

## 2017-11-15 LAB — BLOOD GAS, ARTERIAL
Acid-Base Excess: 1.5 mmol/L (ref 0.0–2.0)
BICARBONATE: 25.2 mmol/L (ref 20.0–28.0)
Drawn by: 42180
FIO2: 21
O2 Saturation: 98.4 %
PCO2 ART: 37.1 mmHg (ref 32.0–48.0)
PH ART: 7.447 (ref 7.350–7.450)
PO2 ART: 111 mmHg — AB (ref 83.0–108.0)
Patient temperature: 98.6

## 2017-11-15 LAB — PROTIME-INR
INR: 1.07
PROTHROMBIN TIME: 13.9 s (ref 11.4–15.2)

## 2017-11-15 LAB — GLUCOSE, CAPILLARY: GLUCOSE-CAPILLARY: 257 mg/dL — AB (ref 65–99)

## 2017-11-15 LAB — TYPE AND SCREEN
ABO/RH(D): A POS
ANTIBODY SCREEN: NEGATIVE

## 2017-11-15 LAB — HEMOGLOBIN A1C
Hgb A1c MFr Bld: 7.1 % — ABNORMAL HIGH (ref 4.8–5.6)
MEAN PLASMA GLUCOSE: 157.07 mg/dL

## 2017-11-15 LAB — APTT: aPTT: 29 seconds (ref 24–36)

## 2017-11-15 LAB — ABO/RH: ABO/RH(D): A POS

## 2017-11-15 LAB — BRAIN NATRIURETIC PEPTIDE: B NATRIURETIC PEPTIDE 5: 341.2 pg/mL — AB (ref 0.0–100.0)

## 2017-11-15 MED ORDER — ALBUTEROL SULFATE (2.5 MG/3ML) 0.083% IN NEBU
2.5000 mg | INHALATION_SOLUTION | Freq: Once | RESPIRATORY_TRACT | Status: AC
  Administered 2017-11-15: 2.5 mg via RESPIRATORY_TRACT

## 2017-11-15 NOTE — Progress Notes (Signed)
[facility]  CARDIOTHORACIC SURGERY CONSULTATION REPORT  Referring Provider is Branch, Alphonse Guild, MD PCP is Kathyrn Drown, MD  Chief Complaint  Patient presents with  . Aortic Stenosis    2nd TAVR Consultation    HPI:  The patient is a 67 year old morbidly obese gentleman with a history of hypertension, hyperlipidemia, obstructive sleep apnea, coronary artery disease status post coronary bypass graft surgery in 2001 with subsequent development of vein graft disease and ischemic cardiomyopathy with chronic combined systolic and diastolic congestive heart failure, ICD placement for primary prevention, and degenerative arthritis who is being evaluated for treatment of severe symptomatic aortic stenosis.  The patient suffered an inferior MI in 2001 that was treated with a PCI using a bare metal stent.  He developed restenosis requiring repeat angioplasty and eventually underwent coronary bypass graft surgery x6 in 2001.  He did well for many years but developed vein graft disease and underwent PCI and stenting of a saphenous vein graft to the ramus branch.  He had an echocardiogram in 2015 showing severe left ventricular systolic dysfunction with ejection fraction of 15%.  Cardiac catheterization at that time showed severe native coronary disease with continued patency of his left internal mammary graft to the distal LAD, a patent saphenous vein graft to the obtuse marginal branch in the right posterior lateral branch, a patent saphenous vein graft to the acute marginal with a totally occluded limb to the posterior descending coronary, and patency of the stent in the saphenous vein graft to the ramus branch with a 50-70% proximal stenosis in that vein graft.  He was continued on medical therapy.  In 2017 he was hospitalized with an acute exacerbation of congestive heart failure and an echocardiogram showed an ejection fraction of 15%.  He had  moderate aortic stenosis at that time with a mean transvalvular gradient of 22 mmHg.  He underwent implantation of an ICD for primary prevention in September 2017 and has never had it fire so far.  In January 2018 he was hospitalized again with acute exacerbation of CHF.  He was seen in follow-up recently by Dr. Harl Bowie and a follow-up echocardiogram on 09/07/2017 continued to show a low ejection fraction with progression of his aortic stenosis with a mean gradient of 26 mmHg and a dimensionless index of 0.18.  The distal apex of left ventricle was not well visualized and mural thrombus could not be ruled out.  He was referred to the multidisciplinary heart valve clinic and was seen by Dr. Burt Knack and underwent cardiac catheterization on 10/05/2017.  Coronary angiography was similar to his previous catheterizations with severe native three-vessel coronary disease including total occlusion of the right coronary artery, left circumflex coronary artery, and LAD.  There was continued patency of the left internal mammary graft to the distal LAD, patency of the saphenous vein graft to the ramus branch, patency of the saphenous vein graft to the obtuse marginal and posterior lateral branch of the distal right coronary, and patency of the saphenous vein graft to the posterior descending coronary artery.  The mean transvalvular gradient across aortic valve was 27 mmHg consistent with low flow, low gradient, severe aortic stenosis.  He had a repeat echocardiogram on 10/08/2017 showed an ejection fraction that was still 15% with no sign of left ventricular mural thrombus.  The mean gradient across aortic valve is increased to 34 mmHg with a dimensionless index of 0.19.  The patient is here today with his  wife whom he lives with in [place].  She had open surgical aortic valve replacement by me about a year ago.  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  He reports  occasional episodes of substernal chest discomfort brought on by strenuous activity but denies any significant shortness of breath.  He denies orthopnea and PND.  He has had no peripheral edema.  He denies dizziness and syncope.  His main complaint is that he gets tired very easily.  Past Medical History:  Diagnosis Date  . AICD (automatic cardioverter/defibrillator) present 05/18/2016  . Anginal pain ([place])    occ none recent  . Aortic stenosis   . Arthritis   . CAD (coronary artery disease)   . Cardiomyopathy, ischemic   . CHF (congestive heart failure) ([place])   . Chronic back pain   . Diabetes mellitus without complication ([place])   . Elevated PSA   . History of kidney stones   . Hyperlipidemia   . Morbid obesity ([place])   . Myocardial infarction ([place])   . OSA (obstructive sleep apnea)    does not wear CPAP, -order run out told need to redo  test  . 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 APPENDECTOMY    . CARDIAC CATHETERIZATION     stents  . CAROTID STENT     denies carotid stent  . CATARACT EXTRACTION, BILATERAL    . CORONARY ARTERY BYPASS GRAFT  12/23/1999   LIMA to LAD, SVG to ramus, sequential SVG to OM2-RPL, sequential SVG to AM-PDA  . EP IMPLANTABLE DEVICE N/A 05/18/2016   Procedure: ICD Implant;  Surgeon: Will Meredith Leeds, MD;  Location: [facility];  Service: Cardiovascular;  Laterality: N/A;  . EYE SURGERY    . ingrown toenail N/A   . LEFT HEART CATHETERIZATION WITH CORONARY/GRAFT ANGIOGRAM N/A 12/21/2014   Procedure: LEFT HEART CATHETERIZATION WITH Beatrix Fetters;  Surgeon: Belva Crome, MD;  Location: [facility];  Service: Cardiovascular;  Laterality: N/A;  . NASAL SEPTOPLASTY W/ TURBINOPLASTY Bilateral 04/25/2017   NASAL SEPTOPLASTY WITH TURBINATE REDUCTION/notes 04/25/2017  . NASAL SEPTOPLASTY W/ TURBINOPLASTY Bilateral  04/25/2017   Procedure: NASAL SEPTOPLASTY WITH TURBINATE REDUCTION;  Surgeon: Leta Baptist, MD;  Location: [facility];  Service: ENT;  Laterality: Bilateral;  . RIGHT/LEFT HEART CATH AND CORONARY/GRAFT ANGIOGRAPHY N/A 10/05/2017   Procedure: RIGHT/LEFT HEART CATH AND CORONARY/GRAFT ANGIOGRAPHY;  Surgeon: Sherren Mocha, MD;  Location: [facility];  Service: Cardiovascular;  Laterality: N/A;  . TONSILLECTOMY      Family History  Adopted: Yes  Problem Relation Age of Onset  . Diabetes Mother   . CAD Mother     Social History   Socioeconomic History  . Marital status: Married    Spouse name: Not on file  . Number of children: Not on file  . Years of education: Not on file  . Highest education level: Not on file  Occupational History  . Occupation: retired  Scientific laboratory technician  . Financial resource strain: Not on file  . Food insecurity:    Worry: Not on file    Inability: Not on file  . Transportation needs:    Medical: Not on file    Non-medical: Not on file  Tobacco Use  . Smoking  status: Former Smoker    Last attempt to quit: 08/29/1999    Years since quitting: 18.2  . Smokeless tobacco: Never Used  . Tobacco comment: couldn't remember date  Substance and Sexual Activity  . Alcohol use: No    Alcohol/week: 0.0 oz  . Drug use: No  . Sexual activity: Not on file  Lifestyle  . Physical activity:    Days per week: Not on file    Minutes per session: Not on file  . Stress: Not on file  Relationships  . Social connections:    Talks on phone: Not on file    Gets together: Not on file    Attends religious service: Not on file    Active member of club or organization: Not on file    Attends meetings of clubs or organizations: Not on file    Relationship status: Not on file  . Intimate partner violence:    Fear of current or ex partner: Not on file    Emotionally abused: Not on file    Physically abused: Not on file    Forced sexual activity: Not on file  Other Topics Concern    . Not on file  Social History Narrative  . Not on file    Current Outpatient Medications  Medication Sig Dispense Refill  . aspirin EC 325 MG tablet Take 650 mg by mouth 2 (two) times daily.    Marland Kitchen aspirin-acetaminophen-caffeine (EXCEDRIN MIGRAINE) 250-250-65 MG tablet Take 2 tablets by mouth daily as needed for headache.    Marland Kitchen aspirin-sod bicarb-citric acid (ALKA-SELTZER) 325 MG TBEF tablet Take 650 mg by mouth daily as needed (indigestion).    . carvedilol (COREG) 6.25 MG tablet Take 1 tablet (6.25 mg total) by mouth 2 (two) times daily with a meal. 60 tablet 6  . clopidogrel (PLAVIX) 75 MG tablet Take 1 tablet (75 mg total) by mouth daily. 90 tablet 3  . HYDROcodone-acetaminophen (NORCO) 10-325 MG tablet 1 bid prn pain (Patient taking differently: Take 1-2 tablets by mouth daily as needed for moderate pain. ) 60 tablet 0  . Insulin Detemir (LEVEMIR) 100 UNIT/ML Pen Inject 12 units into skin each evening. May titrate up to 60 units. (Patient taking differently: Inject 60 Units into the skin at bedtime. ) 15 mL 5  . liver oil-zinc oxide (DESITIN) 40 % ointment Apply 1 application topically as needed for irritation.    . metolazone (ZAROXOLYN) 2.5 MG tablet TAKE ONE TABLET BY MOUTH DAILY. 30 tablet 6  . Multiple Vitamins-Minerals (CENTRUM SILVER PO) Take 2 tablets by mouth daily.     . nitroGLYCERIN (NITROSTAT) 0.4 MG SL tablet PLACE 1 TABLET UNDER TONGUE FOR CHEST PAIN. MAY REPEAT EVERY 5 MIN UPTO 3 DOSES-NO RELIEF,CALL 911. 25 tablet 6  . NOVOLOG FLEXPEN 100 UNIT/ML FlexPen INJECT 6 UNITS INTO THE SKIN WITH EACH MEAL. MAY TITRATE UP 14 UNITS. (Patient taking differently: Inject 20 units SQ twice daily after meals) 15 mL 0  . polycarbophil (FIBERCON) 625 MG tablet Take 2 tablets (1,250 mg total) by mouth daily with lunch. Hold if you have diarrhea (Patient taking differently: Take 1,250 mg by mouth 3 (three) times daily. Hold if you have diarrhea) 30 tablet 0  . potassium chloride SA  (K-DUR,KLOR-CON) 20 MEQ tablet 2 tablets in the am, one tablet midday and 2 tablets qpm (Patient taking differently: Take 20-40 mEq by mouth See admin instructions. Take 40 meq by mouth in the morning, and take 20 meq by mouth midday  and take 40 meq by mouth in the evening) 450 tablet 0  . rosuvastatin (CRESTOR) 40 MG tablet Take 1 tablet (40 mg total) by mouth daily. 90 tablet 2  . SURE COMFORT PEN NEEDLES 31G X 8 MM MISC USING 4 TIMES DAILY. 100 each 5  . torsemide (DEMADEX) 20 MG tablet TAKE (2) TABLETS BY MOUTH TWICE DAILY. (Patient taking differently: TAKE 40 MG BY MOUTH TWICE DAILY.) 180 tablet 0  . trolamine salicylate (ASPERCREME) 10 % cream Apply 1 application topically 2 (two) times daily as needed for muscle pain.      No current facility-administered medications for this visit.     Allergies  Allergen Reactions  . Dyflex-G [Dyphylline-Guaifenesin] Hives      Review of Systems:   General:  decreased appetite, normal energy, + weight gain, no weight loss, no fever  Cardiac:  + chest pain with exertion, no chest pain at rest, +SOB with heavy exertion, no resting SOB, no PND, no orthopnea, no palpitations, no arrhythmia, no atrial fibrillation, no LE edema, no dizzy spells, no syncope  Respiratory:  no shortness of breath, no home oxygen, no productive cough, no dry cough, no bronchitis, no wheezing, no hemoptysis, no asthma, no pain with inspiration or cough, + sleep apnea, does not use CPAP at night  GI:   no difficulty swallowing, no reflux, no frequent heartburn, no hiatal hernia, no abdominal pain, no constipation, no diarrhea, no hematochezia, no hematemesis, no melena  GU:   no dysuria,  no frequency, no urinary tract infection, no hematuria, no enlarged prostate, no kidney stones, no kidney disease  Vascular:  no pain suggestive of claudication, no pain in feet, no leg cramps, no varicose veins, no DVT, no non-healing foot ulcer  Neuro:   no stroke, no TIA's, no seizures, +  headaches, no temporary blindness one eye,  no slurred speech, no peripheral neuropathy, mild chronic pain in feet, no instability of gait, no memory/cognitive dysfunction  Musculoskeletal: + arthritis, no joint swelling, no myalgias, no difficulty walking, normal mobility   Skin:   no rash, no itching, no skin infections, no pressure sores or ulcerations  Psych:   no anxiety, no depression, no nervousness, no unusual recent stress  Eyes:   no blurry vision, no floaters, no recent vision changes, does not wear glasses or contacts  ENT:   no hearing loss, no loose or painful teeth, no dentures, last saw dentist a year ago  Hematologic:  no easy bruising, no abnormal bleeding, no clotting disorder, no frequent epistaxis  Endocrine:  + diabetes, does check CBG's at home      Physical Exam:   BP 132/80 (BP Location: Right Arm, Patient Position: Sitting, Cuff Size: Large)   Pulse 86   Resp 18   Ht 5' 10.5" (1.791 m)   Wt 289 lb 6.4 oz (131.3 kg)   SpO2 98% Comment: RA  BMI 40.94 kg/m   General:  Morbidly obese, in no distress  HEENT:  Unremarkable, NCAT, PERLA, EOMI, oropharynx clear  Neck:   no JVD, no bruits, no adenopathy or thyromegaly  Chest:   clear to auscultation, symmetrical breath sounds, no wheezes, no rhonchi   CV:   RRR, grade III/VI crescendo/decrescendo murmur heard best at RSB,  no diastolic murmur  Abdomen:  soft, non-tender, no masses   Extremities:  warm, well-perfused, pulses diminished, no LE edema  Rectal/GU  Deferred  Neuro:   Grossly non-focal and symmetrical throughout  Skin:   Clean and  dry, no rashes, no breakdown   Diagnostic Tests:   *[facility] [place], [place] [address]                            [phone]  ------------------------------------------------------------------- Transthoracic Echocardiography  Patient:    Tirso, Laws MR #:       [id number] Study  Date: 10/08/2017 Gender:     M Age:        38 Height:     179.1 cm Weight:     128.8 kg BSA:        2.59 m^2 Pt. Status: Room:   SONOGRAPHER  [facility]  ATTENDING    Kerry Hough, M.D.  Berna Spare, M.D.  REFERRING    Kerry Hough, M.D.  PERFORMING   [facility]  cc:  ------------------------------------------------------------------- LV EF: 15% -   20%  ------------------------------------------------------------------- Indications:      Shortness of breath 786.05.  ------------------------------------------------------------------- History:   PMH:  Former Smoker, AICD (automatic cardioverter/defibrillator) present.  Dyspnea.  Coronary artery disease.  Congestive heart failure.  Risk factors:  Hypertension. Diabetes mellitus. Dyslipidemia.  ------------------------------------------------------------------- Study Conclusions  - Left ventricle: The cavity size was moderately dilated. Wall   thickness was increased increased in a pattern of mild to   moderate LVH. The estimated ejection fraction was in the range of   15% to 20%. Diffuse hypokinesis. Doppler parameters are   consistent with restrictive physiology, indicative of decreased   left ventricular diastolic compliance and/or increased left   atrial pressure. Doppler parameters are consistent with high   ventricular filling pressure. - Aortic valve: Moderately calcified annulus. Trileaflet;   moderately thickened leaflets. Findings consistent with severe   low flow low gradient aortic stenosis. Mean gradient (S): 34 mm   Hg. VTI ratio of LVOT to aortic valve: 0.19. Valve area (VTI):   0.6 cm^2. Valve area (Vmax): 0.6 cm^2. Valve area (Vmean): 0.52   cm^2. - Mitral valve: Mildly calcified annulus. Mildly thickened leaflets   . - Left atrium: The atrium was severely dilated. - Right atrium: The atrium was mildly dilated. - Technically difficult study. Echocontrast was used  to enhance   visualization. - No evidence of intracardiac thrombus.  ------------------------------------------------------------------- Study data:  Comparison was made to the study of 09/07/2017.  Study status:  Routine.  Procedure:  Transthoracic echocardiography. Image quality was adequate. The study was technically difficult, as a result of body habitus. Intravenous contrast (Definity) was administered.  Study completion:  There were no complications.     Transthoracic echocardiography.  M-mode, complete 2D, spectral Doppler, and color Doppler.  Birthdate:  Patient birthdate: [birth year].  Age:  Patient is 67 yr old.  Sex:  Gender: male. BMI: 40.2 kg/m^2.  Blood pressure:     100/58  Patient status: Outpatient.  Study date:  Study date: 10/08/2017. Study time: 08:42 AM.  Location:  Echo laboratory.  -------------------------------------------------------------------  ------------------------------------------------------------------- Left ventricle:  The cavity size was moderately dilated. Wall thickness was increased increased in a pattern of mild to moderate LVH. The estimated ejection fraction was in the range of 15% to 20%.  Diffuse hypokinesis. Doppler parameters are consistent with restrictive physiology, indicative of decreased left ventricular diastolic compliance and/or increased left atrial pressure. Doppler parameters are consistent with high ventricular filling pressure.   ------------------------------------------------------------------- Aortic valve:   Moderately calcified annulus. Trileaflet; moderately thickened leaflets.  Doppler:  There was no regurgitation. Findings consistent with severe low flow low gradient aortic stenosis.    VTI ratio of LVOT to aortic valve: 0.19. Valve area (VTI): 0.6 cm^2. Indexed valve area (VTI): 0.23 cm^2/m^2. Peak velocity ratio of LVOT to aortic valve: 0.19. Valve area (Vmax): 0.6 cm^2. Indexed valve area (Vmax): 0.23  cm^2/m^2. Mean velocity ratio of LVOT to aortic valve: 0.16. Valve area (Vmean): 0.52 cm^2. Indexed valve area (Vmean): 0.2 cm^2/m^2. Mean gradient (S): 34 mm Hg. Peak gradient (S): 56 mm Hg.  ------------------------------------------------------------------- Aorta:  Aortic root: The aortic root was normal in size.  ------------------------------------------------------------------- Mitral valve:   Mildly calcified annulus. Mildly thickened leaflets .  Doppler:   There was no evidence for stenosis.   There was no significant regurgitation.    Peak gradient (D): 5 mm Hg.  ------------------------------------------------------------------- Left atrium:  The atrium was severely dilated.  ------------------------------------------------------------------- Atrial septum:  Poorly visualized. No defect or patent foramen ovale was identified.  ------------------------------------------------------------------- Right ventricle:  Poorly visualized.  ------------------------------------------------------------------- Pulmonic valve:   Not well visualized.  Doppler:   There was no evidence for stenosis.   There was no significant regurgitation.   ------------------------------------------------------------------- Tricuspid valve:   Normal thickness leaflets.  Doppler:   There was no evidence for stenosis.   There was no significant regurgitation.   ------------------------------------------------------------------- Pulmonary artery:    Systolic pressure could not be accurately estimated.   Inadequate TR jet.  ------------------------------------------------------------------- Right atrium:  Poorly visualized. The atrium was mildly dilated.   ------------------------------------------------------------------- Pericardium:  There was no pericardial effusion.  ------------------------------------------------------------------- Systemic veins: Inferior vena cava: The vessel was  normal in size. The respirophasic diameter changes were in the normal range (>= 50%), consistent with normal central venous pressure.  ------------------------------------------------------------------- Measurements   Left ventricle                           Value           Reference  LV ID, ED, PLAX chordal          (H)     68.4   mm       43 - 52  LV ID, ES, PLAX chordal          (H)     56.8   mm       23 - 38  LV fx shortening, PLAX chordal   (L)     17     %        >=29  LV PW thickness, ED                      12.6   mm       ---------  IVS/LV PW ratio, ED                      0.95            <=1.3    Ventricular septum                       Value           Reference  IVS thickness, ED                        12     mm       ---------    LVOT                                     Value           Reference  LVOT ID, S                               20     mm       ---------  LVOT area                                3.14   cm^2     ---------  LVOT peak velocity, S                    69.97  cm/s     ---------  LVOT mean velocity, S                    45.3   cm/s     ---------  LVOT VTI, S                              16.27  cm       ---------  Stroke volume (SV), LVOT DP              51.1   ml       ---------  Stroke index (SV/bsa), LVOT DP           19.8   ml/m^2   ---------    Aortic valve                             Value           Reference  Aortic valve peak velocity, S            372.96 cm/s     ---------  Aortic valve mean velocity, S            275.06 cm/s     ---------  Aortic valve VTI, S                      87.17  cm       ---------  Aortic mean gradient, S                  34     mm Hg    ---------  Aortic peak gradient, S                  56     mm Hg    ---------  VTI ratio, LVOT/AV                       0.19            ---------  Aortic valve area, VTI                   0.6  cm^2     ---------  Aortic valve area/bsa, VTI               0.23   cm^2/m^2 ---------   Velocity ratio, peak, LVOT/AV            0.19            ---------  Aortic valve area, peak velocity         0.6    cm^2     ---------  Aortic valve area/bsa, peak              0.23   cm^2/m^2 ---------  velocity  Velocity ratio, mean, LVOT/AV            0.16            ---------  Aortic valve area, mean velocity         0.52   cm^2     ---------  Aortic valve area/bsa, mean              0.2    cm^2/m^2 ---------  velocity    Aorta                                    Value           Reference  Aortic root ID, ED                       36     mm       ---------    Left atrium                              Value           Reference  LA ID, A-P, ES                           53     mm       ---------  LA volume/bsa, ES, 1-p A4C               41     ml/m^2   ---------    Mitral valve                             Value           Reference  Mitral E-wave peak velocity              116    cm/s     ---------  Mitral A-wave peak velocity              55.3   cm/s     ---------  Mitral deceleration time                 194    ms       150 - 230  Mitral peak gradient, D                  5      mm Hg    ---------  Mitral E/A ratio, peak                   2.1             ---------  Legend: (L)  and  (H)  mark values outside specified reference range.  ------------------------------------------------------------------- Prepared and Electronically Authenticated by  Kerry Hough, M.D. 2019-02-11T11:18:07   Physicians   Panel Physicians Referring Physician Case Authorizing Physician  Sherren Mocha, MD (Primary)    Procedures   RIGHT/LEFT HEART CATH AND CORONARY/GRAFT ANGIOGRAPHY  Conclusion   1.  Severe three-vessel coronary artery disease with total occlusion of the RCA, total occlusion of the left circumflex, and total occlusion of the LAD just after the first diagonal 2.  Status post aortocoronary bypass surgery with patent grafts including LIMA to LAD, saphenous vein graft to ramus  intermedius, saphenous vein graft to second obtuse marginal, and saphenous vein graft to right PDA 3.  Calcified, restricted aortic valve under plain fluoroscopy, with mean transvalvular gradient 27 mmHg  Recommendation continued evaluation for progressive aortic stenosis suspicious for low flow low gradient aortic stenosis, CT angiography of the heart and peripheral vessels will be ordered and multidisciplinary evaluation by cardiac surgery.  Indications   Severe aortic stenosis [I35.0 (ICD-10-CM)]  Procedural Details/Technique   Technical Details INDICATION: 67 year old with severe ischemic cardiomyopathy and progressive aortic stenosis suspicious for low flow low gradient aortic stenosis.  PROCEDURAL DETAILS: The right groin was prepped, draped, and anesthetized with 1% lidocaine. Using the modified Seldinger technique a 5 French sheath was placed in the right femoral artery and a 7 French sheath was placed in the right femoral vein. Long sheaths were used. Access was obtained under ultrasound guidance. Ultrasound images were captured and stored in the patient's chart. A Swan-Ganz catheter was used for the right heart catheterization. Standard protocol was followed for recording of right heart pressures and sampling of oxygen saturations. Fick cardiac output was calculated. Standard Judkins catheters were used for selective coronary angiography, LIMA angiography, and saphenous vein graft angiography. The aortic valve was crossed with a JR4 catheter and a J-tip wire. Pullback gradient across the aortic valve is recorded. There were no immediate procedural complications. The patient was transferred to the post catheterization recovery area for further monitoring.    Estimated blood loss <50 mL.  During this procedure the patient was administered the following to achieve and maintain moderate conscious sedation: Versed 2 mg, Fentanyl 50 mcg, while the patient's heart rate, blood pressure, and  oxygen saturation were continuously monitored. The period of conscious sedation was 58 minutes, of which I was present face-to-face 100% of this time.  Coronary Findings   Diagnostic  Dominance: Right  Left Anterior Descending  Prox LAD-1 lesion 50% stenosed  Prox LAD-1 lesion is 50% stenosed.  Prox LAD-2 lesion 100% stenosed  Prox LAD-2 lesion is 100% stenosed.  Left Circumflex  Prox Cx lesion 100% stenosed  Prox Cx lesion is 100% stenosed.  Right Coronary Artery  Prox RCA lesion 100% stenosed  Prox RCA lesion is 100% stenosed.  Prox RCA to Mid RCA lesion 100% stenosed  Prox RCA to Mid RCA lesion is 100% stenosed. The lesion was previously treated.  First Right Posterolateral  Collaterals  1st RPLB filled by collaterals from 1st Sept.    LIMA LIMA Graft to Mid LAD  LIMA graft was visualized by angiography and is normal in caliber. The graft exhibits no disease. The LIMA to LAD is widely patent without obstruction. The vessel retrograde fills back to the proximal LAD which is totally occluded. The mid and distal LAD have no significant obstruction. The septal perforators of the LAD supplied collaterals to the right PDA.  Graft to Ramus  Prox Graft to Mid Graft lesion 50% stenosed  Prox Graft to Mid Graft lesion is 50% stenosed. The lesion was previously treated. there is 50% stenosis at the proximal stent edge, unchanged from the previous cath study in 2016  Graft to RPDA  This graft is a sequential graft to the PLA, acute marginal, and PDA. The PDA target is the only vessel visualized.  Graft to 2nd Mrg  Intervention   No interventions have been documented.  Left Heart   Aortic Valve The aortic valve is calcified. There is restricted aortic valve motion. The peak to peak transaortic gradient is 29 mmHg, mean gradient 27 mmHg, calculated valve area 1.83 cm  Coronary Diagrams   Diagnostic Diagram       Implants    No implant documentation for this case.  MERGE Images    Show images for CARDIAC CATHETERIZATION   Link to Procedure Log   Procedure Log    Hemo Data    Most Recent Value  Fick Cardiac Output 6.94 L/min  Fick Cardiac Output Index 2.83 (L/min)/BSA  Aortic Mean Gradient 26.6 mmHg  Aortic Peak Gradient 29 mmHg  Aortic Valve Area 1.83  Aortic Value Area Index 0.75 cm2/BSA  RA A Wave 9 mmHg  RA V Wave 8 mmHg  RA Mean 7 mmHg  RV Systolic Pressure 37 mmHg  RV Diastolic Pressure 3 mmHg  RV EDP 9 mmHg  PA Systolic Pressure 39 mmHg  PA Diastolic Pressure 21 mmHg  PA Mean 28 mmHg  PW A Wave 17 mmHg  PW V Wave 12 mmHg  PW Mean 13 mmHg  AO Systolic Pressure 97 mmHg  AO Diastolic Pressure 66 mmHg  AO Mean 78 mmHg  LV Systolic Pressure 270 mmHg  LV Diastolic Pressure 5 mmHg  LV EDP 27 mmHg  Arterial Occlusion Pressure Extended Systolic Pressure 350 mmHg  Arterial Occlusion Pressure Extended Diastolic Pressure 67 mmHg  Arterial Occlusion Pressure Extended Mean Pressure 81 mmHg  Left Ventricular Apex Extended Systolic Pressure 093 mmHg  Left Ventricular Apex Extended Diastolic Pressure 4 mmHg  Left Ventricular Apex Extended EDP Pressure 17 mmHg  QP/QS 1  TPVR Index 9.88 HRUI  TSVR Index 27.51 HRUI  PVR SVR Ratio 0.15  TPVR/TSVR Ratio 0.36    ADDENDUM REPORT: 10/17/2017 10:10  CLINICAL DATA:  67 year old male with ischemic cardiomyopathy, h/o CABG, severe aortic stenosis being evaluated for a TAVR and AVR procedure.  EXAM: Cardiac TAVR CT  TECHNIQUE: The patient was scanned on a [employer]. A 120 kV retrospective scan was triggered in the descending thoracic aorta at 111 HU's. Gantry rotation speed was 250 msecs and collimation was .6 mm. No beta blockade or nitro were given. The 3D data set was reconstructed in 5% intervals of the R-R cycle. Systolic and diastolic phases were analyzed on a dedicated work station using MPR, MIP and VRT modes. The patient received 80 cc of contrast.  FINDINGS: Aortic Valve:  Functionally bileaflet aortic valve with co-joined left and right coronary cusp. There are asymmetric severe calcifications of the non-coronary leaflet that are also extending into the LVOT.  Aorta: Normal size, moderate diffuse atherosclerosis and calcifications, no dissection.  Sinotubular Junction: 32 x 31 mm  Ascending Thoracic Aorta: Upper normal size measuring 40 x 38 mm.  Aortic Arch: 26 x 26 mm  Descending Thoracic Aorta: 24 x 24 mm  Sinus of Valsalva Measurements:  Non-coronary: 36 mm  Right -coronary: 34 mm  Left -coronary: 36 mm  Coronary Artery  Height above Annulus:  Left Main: 11 mm  Right Coronary: 15 mm  Virtual Basal Annulus Measurements:  Maximum/Minimum Diameter: 28.0 x 24.  1 mm  Mean Diameter: 25.5 mm  Perimeter: 81.3  Area: 509 mm2  Optimum Fluoroscopic Angle for Delivery: LAO 11 CAU 8  IMPRESSION: 1. Functionally bileaflet aortic valve with co-joined left and right coronary cusp. There are asymmetric severe calcifications of the non-coronary leaflet that are also extending into the LVOT. Annular measurements suitable for delivery of a 26 mm Edwards-SAPIEN 3 valve.  2. Sufficient right coronary to annulus distance, borderline left coronary to annulus distance (11 mm).  3. Optimum Fluoroscopic Angle for Delivery:  LAO 11 CAU 8  4. No thrombus in the left atrial appendage.   Electronically Signed   By: Ena Dawley   On: 10/17/2017 10:10  CLINICAL DATA:  67 year old male with history of severe aortic stenosis. Preprocedural study prior to potential transcatheter aortic valve replacement (TAVR) procedure.  EXAM: CT ANGIOGRAPHY CHEST, ABDOMEN AND PELVIS  TECHNIQUE: Multidetector CT imaging through the chest, abdomen and pelvis was performed using the standard protocol during bolus administration of intravenous contrast. Multiplanar reconstructed images and MIPs were obtained and reviewed to  evaluate the vascular anatomy.  CONTRAST:  110mL ISOVUE-370 IOPAMIDOL (ISOVUE-370) INJECTION 76%  COMPARISON:  None.  FINDINGS: CTA CHEST FINDINGS  Cardiovascular: Heart size is mildly enlarged. There is no significant pericardial fluid, thickening or pericardial calcification. There is aortic atherosclerosis, as well as atherosclerosis of the great vessels of the mediastinum and the coronary arteries, including calcified atherosclerotic plaque in the left anterior descending and right coronary arteries. Status post median sternotomy for CABG including LIMA to the LAD.  Mediastinum/Lymph Nodes: No pathologically enlarged mediastinal or hilar lymph nodes. Esophagus is unremarkable in appearance. No axillary lymphadenopathy.  Lungs/Pleura: No suspicious appearing pulmonary nodules or masses. No acute consolidative airspace disease. No pleural effusions.  Musculoskeletal/Soft Tissues: Median sternotomy wires. There are no aggressive appearing lytic or blastic lesions noted in the visualized portions of the skeleton.  CTA ABDOMEN AND PELVIS FINDINGS  Hepatobiliary: Diffuse low attenuation throughout the hepatic parenchyma, compatible with known of steatosis no suspicious cystic or solid hepatic lesions. No intra or extrahepatic biliary ductal dilatation. 9 mm calcified gallstone lying dependently in the gallbladder. No findings to suggest an acute cholecystitis at this time.  Pancreas: No pancreatic mass. No pancreatic ductal dilatation. No pancreatic or peripancreatic fluid or inflammatory changes.  Spleen: Unremarkable.  Adrenals/Urinary Tract: 1.6 cm exophytic simple cyst in the lower pole of left kidney. Right kidney is normal in appearance. No hydroureteronephrosis. Small amount of contrast material lying dependently in the urinary bladder, presumably from test injection. Urinary bladder is otherwise unremarkable in appearance. Right adrenal mass measuring  3.1 x 4.0 cm demonstrating considerable macroscopic lipid, incompletely characterized, but unchanged compared to the prior CT the abdomen and pelvis 09/15/2016.  Stomach/Bowel: Normal appearance of the stomach. No pathologic dilatation of small bowel or colon. Numerous colonic diverticulae are noted, without surrounding inflammatory changes to suggest an acute diverticulitis at this time. The appendix is not confidently identified and may be surgically absent. Regardless, there are no inflammatory changes noted adjacent to the cecum to suggest the presence of an acute appendicitis at this time.  Vascular/Lymphatic: Aortic atherosclerosis, with vascular findings and measurements pertinent to potential TAVR procedure, as detailed below. No aneurysm or dissection noted in the abdominal or pelvic vasculature. Celiac axis, superior mesenteric artery and inferior mesenteric artery are all widely patent  without hemodynamically significant stenosis. Single right and 2 left renal arteries are all widely patent. No lymphadenopathy noted in the abdomen or pelvis.  Reproductive: Prostate gland and seminal vesicles are unremarkable in appearance.  Other: No significant volume of ascites.  No pneumoperitoneum.  Musculoskeletal: There are no aggressive appearing lytic or blastic lesions noted in the visualized portions of the skeleton.  VASCULAR MEASUREMENTS PERTINENT TO TAVR:  AORTA:  Minimal Aortic Diameter-14 x 16 mm  Severity of Aortic Calcification-mild-to-moderate  RIGHT PELVIS:  Right Common Iliac Artery -  Minimal Diameter-12.0 x 8.7 mm  Tortuosity-mild  Calcification-moderate  Right External Iliac Artery -  Minimal Diameter-8.0 x 7.7 mm  Tortuosity-severe  Calcification-none  Right Common Femoral Artery -  Minimal Diameter-8.7 x 8.3 mm  Tortuosity-mild  Calcification-mild  LEFT PELVIS:  Left Common Iliac Artery -  Minimal  Diameter-12.4 x 10.2 mm  Tortuosity-mild  Calcification-moderate  Left External Iliac Artery -  Minimal Diameter-8.7 x 7.9 mm  Tortuosity-severe  Calcification-none  Left Common Femoral Artery -  Minimal Diameter-9.3 x 9.9 mm  Tortuosity-mild  Calcification-mild  Review of the MIP images confirms the above findings.  IMPRESSION: 1. Vascular findings and measurements pertinent to potential TAVR procedure, as above. 2. Severe thickening calcification of the aortic valve, compatible with severe aortic stenosis. 3. Aortic atherosclerosis, in addition to three vessel coronary artery disease. Status post median sternotomy for CABG including LIMA to the LAD. 4. 3.1 x 4.0 cm right adrenal mass, incompletely characterized on today's examination. This does contain macroscopic lipid, and is similar in size to prior study from 09/15/2016, favored to be a benign lesions such as an adenoma or myelolipoma. 5. Additional incidental findings, as above.  Aortic Atherosclerosis (ICD10-I70.0).   Electronically Signed   By: Vinnie Langton M.D.   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  He currently complains  mainly of tiredness and exertional fatigue as well as some stable exertional angina.  His congestive heart failure symptoms are under fairly good control and currently New York Heart Association class I-II.  I have personally reviewed his echocardiogram, cardiac catheterization, and CTA studies.  His echo shows an ejection fraction of 15%.  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  Cardiac catheterization shows stable severe coronary artery disease with continued patency of 4 of the 6 bypass grafts with a mean transvalvular gradient of 27 mmHg consistent with low flow, low gradient, severe aortic stenosis.  I think aortic valve replacement is indicated in this patient with severe aortic stenosis and severe left ventricular systolic dysfunction and  recurrent admissions for acute exacerbation of congestive heart failure.  He would be at high risk for open surgical aortic valve replacement with or without redo coronary bypass graft surgery due to his morbid obesity, redo status, and severe left ventricular systolic dysfunction with ejection fraction of 15%.  I agree that transcatheter aortic valve replacement would be the best treatment for this patient.  His gated cardiac CTA shows anatomy suitable for transcatheter aortic valve replacement without any significant complicating features.  His abdominal and pelvic CTA shows adequate pelvic vasculature to allow transfemoral insertion.  The patient and his wife for counseled at length regarding treatment alternatives for management of severe symptomatic aortic stenosis. The risks and benefits of surgical intervention has been discussed in detail. Long-term prognosis with medical therapy was discussed. Alternative approaches such as conventional surgical aortic valve  replacement, transcatheter aortic valve replacement, and palliative medical therapy were compared and contrasted at length. This discussion was placed in the context of the patient's own specific clinical presentation and past medical history. All of their questions have been addressed.   Following the decision to proceed with transcatheter aortic valve replacement, a discussion was held regarding what types of management strategies would be attempted intraoperatively in the event of life-threatening complications, including whether or not the patient would be considered a candidate for the use of cardiopulmonary bypass and/or conversion to open sternotomy for attempted surgical intervention. The patient has been advised of a variety of complications that might develop including but not limited to risks of death, stroke, paravalvular leak, aortic dissection or other major vascular complications, aortic annulus rupture, device embolization, cardiac rupture or perforation, mitral regurgitation, acute myocardial infarction, arrhythmia, heart block or bradycardia requiring permanent pacemaker placement, congestive heart failure, respiratory failure, renal failure, pneumonia, infection, other late complications related to structural valve deterioration or migration, or other complications that might ultimately cause a temporary or permanent loss of functional independence or other long term morbidity. The patient provides full informed consent for the procedure as described and all questions were answered.     Plan:  He has been scheduled for transcatheter aortic valve replacement via transfemoral approach on 11/20/2017.  I spent 45 minutes performing this consultation and > 50% of this time was spent face to face counseling and coordinating the care of this patient's severe symptomatic aortic stenosis.    Gaye Pollack, MD 11/15/2017 5:53 PM

## 2017-11-15 NOTE — Addendum Note (Signed)
Addended by: Carney Living on: 11/15/2017 05:42 PM   Modules accepted: Orders

## 2017-11-15 NOTE — Progress Notes (Signed)
Carotid duplex prelim: 1-39% ICA stenosis.  Axcel Horsch Eunice, RDMS, RVT   

## 2017-11-15 NOTE — Progress Notes (Signed)
PCP - Dr. Sallee Lange  Cardiologist - Dr. Harl Bowie  Chest x-ray - 11/15/17  EKG - 11/15/17  Stress Test - Denies  ECHO - 10/08/17 (E)  Cardiac Cath - 10/05/17 (E)  Sleep Study - Yes- Positive CPAP - None  LABS- 11/15/17: CBC, CMP, PT, PTT, BNP, ABG, T/S, UA  HA1C- 11/15/17 Fasting Blood Sugar - 155, Today, 257 Checks Blood Sugar __1___ times a week  Pt and wife sts they will meet with Dr. Cyndia Bent today at 4p, and they will ask about the Aspirin and Plavix adjustments.  ICD form faxed to Dr. Macky Lower office.  Anesthesia- Yes- Cardiac history  Pt denies having chest pain, sob, or fever at this time. All instructions explained to the pt, with a verbal understanding of the material. Pt agrees to go over the instructions while at home for a better understanding. The opportunity to ask questions was provided.

## 2017-11-15 NOTE — Therapy (Addendum)
[place], [name], [address] Phone: [phone]   Fax:  [phone]  Physical Therapy Evaluation  Patient Details  Name: [name] [name] MRN: [id number] Date of Birth: [birth year] Referring Provider: Dr Germaine Pomfret    Encounter Date: 11/15/2017  PT End of Session - 11/15/17 1452    Visit Number  1    Number of Visits  1    Date for PT Re-Evaluation  11/15/17    Authorization Type  THN    PT Start Time  1007    PT Stop Time  1048    PT Time Calculation (min)  41 min    Activity Tolerance  Patient tolerated treatment well    Behavior During Therapy  [facility] for tasks assessed/performed       Past Medical History:  Diagnosis Date  . AICD (automatic cardioverter/defibrillator) present 05/18/2016  . Anginal pain ([place])    occ none recent  . Aortic stenosis   . Arthritis   . CAD (coronary artery disease)   . Cardiomyopathy, ischemic   . CHF (congestive heart failure) ([place])   . Chronic back pain   . Diabetes mellitus without complication ([place])   . Elevated PSA   . History of kidney stones   . Hyperlipidemia   . Morbid obesity ([place])   . Myocardial infarction ([place])   . OSA (obstructive sleep apnea)    does not wear CPAP, -order run out told need to redo  test  . 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 APPENDECTOMY    . CARDIAC CATHETERIZATION     stents  . CAROTID STENT     denies carotid stent  . CATARACT EXTRACTION, BILATERAL    . CORONARY ARTERY BYPASS GRAFT  12/23/1999   LIMA to LAD, SVG to ramus, sequential SVG to OM2-RPL, sequential SVG to AM-PDA  . EP IMPLANTABLE DEVICE N/A 05/18/2016   Procedure: ICD Implant;  Surgeon: Will Meredith Leeds, MD;  Location: [place] [facility];  Service: Cardiovascular;  Laterality: N/A;  . EYE SURGERY    . ingrown toenail N/A   . LEFT  HEART CATHETERIZATION WITH CORONARY/GRAFT ANGIOGRAM N/A 12/21/2014   Procedure: LEFT HEART CATHETERIZATION WITH Beatrix Fetters;  Surgeon: Belva Crome, MD;  Location: [facility] CATH LAB;  Service: Cardiovascular;  Laterality: N/A;  . NASAL SEPTOPLASTY W/ TURBINOPLASTY Bilateral 04/25/2017   NASAL SEPTOPLASTY WITH TURBINATE REDUCTION/notes 04/25/2017  . NASAL SEPTOPLASTY W/ TURBINOPLASTY Bilateral 04/25/2017   Procedure: NASAL SEPTOPLASTY WITH TURBINATE REDUCTION;  Surgeon: Leta Baptist, MD;  Location: [place];  Service: ENT;  Laterality: Bilateral;  . RIGHT/LEFT HEART CATH AND CORONARY/GRAFT ANGIOGRAPHY N/A 10/05/2017   Procedure: RIGHT/LEFT HEART CATH AND CORONARY/GRAFT ANGIOGRAPHY;  Surgeon: Sherren Mocha, MD;  Location: [place] [facility];  Service: Cardiovascular;  Laterality: N/A;  . TONSILLECTOMY      There were no vitals filed for this visit.   Subjective Assessment - 11/15/17 1013    Subjective  Patient presents for evaluation prior to TAVR procedure. His chief complaint is fatigue when he is doing work. He reports he used to be able to work 12-14 hours a day without a rest.     Patient Stated Goals  To have less fatigue     Currently in Pain?  No/denies  [facility] PT Assessment - 11/15/17 0001      Assessment   Referring Provider  Dr Germaine Pomfret     Next MD Visit  11/15/2017    Prior Therapy  none       Precautions   Precautions  None      Restrictions   Weight Bearing Restrictions  No      Balance Screen   Has the patient fallen in the past 6 months  No    Has the patient had a decrease in activity level because of a fear of falling?   No    Is the patient reluctant to leave their home because of a fear of falling?   No      Prior Function   Level of Independence  Independent    Vocation  Retired    Leisure  Back in his shop       Cognition   Overall Cognitive Status  Within Functional Limits for tasks assessed    Attention  Focused    Focused Attention   Appears intact    Memory  Appears intact    Awareness  Appears intact    Problem Solving  Appears intact      Sensation   Light Touch  Appears Intact      Coordination   Gross Motor Movements are Fluid and Coordinated  Yes    Fine Motor Movements are Fluid and Coordinated  Yes      ROM / Strength   AROM / PROM / Strength  AROM;PROM;Strength      AROM   Overall AROM Comments  can only reach arms overhead to 90 degrees       Strength   Overall Strength Comments  5/5 gross bilateral lower strength 5/5 UE strength in  available range    Strength Assessment Site  Hand    Right/Left hand  Right;Left    Right Hand Grip (lbs)  70    Left Hand Grip (lbs)  65       [facility] Pre-Surgical Assessment - 11/15/17 0001    5 Meter Walk Test- trial 1  5 sec    5 Meter Walk Test- trial 2  5 sec.     5 Meter Walk Test- trial 3  5 sec.    5 meter walk test average  5 sec    4 Stage Balance Test Position  3    comment  could not main tain single leg stance without upper extremity assistance     Sit To Stand Test- trial 1  5 sec.    Comment  10 seconds     ADL/IADL Independent with:  Bathing;Dressing;Meal prep;Finances;Yard work    Other comment  becomes fatigued     6 Minute Walk- Baseline  yes    BP (mmHg)  104/61    HR (bpm)  80    02 Sat (%RA)  94 %    Modified Borg Scale for Dyspnea  0- Nothing at all    Perceived Rate of Exertion (Borg)  6-    6 Minute Walk Post Test  yes    BP (mmHg)  136/66    HR (bpm)  115    02 Sat (%RA)  89 %    Modified Borg Scale for Dyspnea  1- Very mild shortness of breath    Perceived Rate of Exertion (Borg)  12-    Aerobic Endurance Distance Walked  1110    Endurance additional comments  mild  shortness pof breath.  Could still talk easily            Objective measurements completed on examination: See above findings.    Clinical Impression Statement: Pt is a 67 yo Male presenting to OP PT for evaluation prior to possible TAVR surgery due to severe  aortic stenosis. Pt reports onset of fatigue  approximately >12 months ago. Symptoms are limiting ability to work in his shop and perfrom IADL's  Pt presents with WNL ROM and strength, fair balance, Normal walking speed and fair aerobic endurance per 6 minute walk test. Pt ambulated 1110 feet in 6  At time of rest, patient's HR was 115 bpm and O2 was89 on room air. Pt reported 1/10 shortness of breath on modified scale for dyspnea. B/P increase significantly with 6 minute walk test. Based on the Short Physical Performance Battery, patient has a frailty rating of 11/12 with </= 5/12 considered frail.            PT Education - 11/15/17 1014    Education provided  Yes    Education Details  symtpom mangement with testing     Person(s) Educated  Patient    Methods  Explanation;Demonstration;Verbal cues    Comprehension  Verbalized understanding;Returned demonstration;Verbal cues required;Tactile cues required                  Plan - 11/15/17 1453    Clinical Impression Statement  See below     Clinical Presentation  Stable    Clinical Decision Making  Low    PT Frequency  One time visit    PT Next Visit Plan  1x visit        Patient will benefit from skilled therapeutic intervention in order to improve the following deficits and impairments:     Visit Diagnosis: Difficulty in walking, not elsewhere classified     Problem List Patient Active Problem List   Diagnosis Date Noted  . Aortic stenosis   . S/P nasal septoplasty 04/25/2017  . AICD (automatic cardioverter/defibrillator) present 11/22/2016  . CKD (chronic kidney disease) stage 3, GFR 30-59 ml/min (HCC)   . Acute kidney injury ([place])   . Difficulty in walking, not elsewhere classified   . Pain in the abdomen   . Anasarca   . Constipation   . Dyspnea 09/11/2016  . Prolonged Q-T interval on ECG 09/11/2016  . Hypokalemia 09/11/2016  . Chest pain 10/22/2015  . Diabetes mellitus, type 2 ([place]) 10/22/2015  .  Acute on chronic congestive heart failure ([place])   . Essential hypertension   . Coronary artery disease due to lipid rich plaque   . Chronic lumbar pain 01/01/2015  . Obstructive sleep apnea 10/02/2014  . Cardiomyopathy, ischemic 06/25/2014  . Congestive heart failure ([place]) 10/29/2013  . Right ankle pain 08/14/2013  . Hyperlipemia 05/14/2013  . CAD (coronary artery disease) of artery bypass graft 05/14/2013  . Morbid obesity ([place]) 05/14/2013  . Elevated PSA 05/14/2013  . 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

## 2017-11-16 LAB — SURGICAL PCR SCREEN
MRSA, PCR: NEGATIVE
Staphylococcus aureus: POSITIVE — AB

## 2017-11-16 NOTE — Progress Notes (Signed)
Mupirocin prescription called to [facility].

## 2017-11-16 NOTE — Progress Notes (Addendum)
Email sent to Mardene Sayer, and Lindsi regarding ICD orders from Dr. Curt Bears.

## 2017-11-19 ENCOUNTER — Encounter (HOSPITAL_COMMUNITY): Payer: Self-pay | Admitting: Certified Registered Nurse Anesthetist

## 2017-11-19 MED ORDER — NITROGLYCERIN IN D5W 200-5 MCG/ML-% IV SOLN
2.0000 ug/min | INTRAVENOUS | Status: DC
  Filled 2017-11-19: qty 250

## 2017-11-19 MED ORDER — MAGNESIUM SULFATE 50 % IJ SOLN
40.0000 meq | INTRAMUSCULAR | Status: DC
  Filled 2017-11-19: qty 9.85

## 2017-11-19 MED ORDER — VANCOMYCIN HCL 10 G IV SOLR
1500.0000 mg | INTRAVENOUS | Status: AC
  Administered 2017-11-20: 1500 mg via INTRAVENOUS
  Filled 2017-11-19: qty 1500

## 2017-11-19 MED ORDER — DEXMEDETOMIDINE HCL IN NACL 400 MCG/100ML IV SOLN
0.1000 ug/kg/h | INTRAVENOUS | Status: DC
Start: 2017-11-20 — End: 2017-11-20
  Filled 2017-11-19: qty 100

## 2017-11-19 MED ORDER — POTASSIUM CHLORIDE 2 MEQ/ML IV SOLN
80.0000 meq | INTRAVENOUS | Status: DC
  Filled 2017-11-19: qty 40

## 2017-11-19 MED ORDER — SODIUM CHLORIDE 0.9 % IV SOLN
INTRAVENOUS | Status: DC
  Filled 2017-11-19: qty 1

## 2017-11-19 MED ORDER — NOREPINEPHRINE BITARTRATE 1 MG/ML IV SOLN
0.0000 ug/min | INTRAVENOUS | Status: AC
  Administered 2017-11-20: 2 ug/min via INTRAVENOUS
  Filled 2017-11-19: qty 4

## 2017-11-19 MED ORDER — SODIUM CHLORIDE 0.9 % IV SOLN
INTRAVENOUS | Status: DC
  Filled 2017-11-19: qty 30

## 2017-11-19 MED ORDER — DOPAMINE-DEXTROSE 3.2-5 MG/ML-% IV SOLN
0.0000 ug/kg/min | INTRAVENOUS | Status: DC
  Filled 2017-11-19: qty 250

## 2017-11-19 MED ORDER — SODIUM CHLORIDE 0.9 % IV SOLN
30.0000 ug/min | INTRAVENOUS | Status: DC
  Filled 2017-11-19: qty 2

## 2017-11-19 MED ORDER — CEFUROXIME SODIUM 1.5 G IV SOLR
1.5000 g | INTRAVENOUS | Status: AC
  Administered 2017-11-20: 1.5 g via INTRAVENOUS
  Filled 2017-11-19: qty 1.5

## 2017-11-19 MED ORDER — EPINEPHRINE PF 1 MG/ML IJ SOLN
0.0000 ug/min | INTRAVENOUS | Status: DC
  Filled 2017-11-19: qty 4

## 2017-11-19 NOTE — Anesthesia Preprocedure Evaluation (Addendum)
Anesthesia Evaluation  Patient identified by MRN, date of birth, ID band Patient awake    Reviewed: Allergy & Precautions, H&P , NPO status , Patient's Chart, lab work & pertinent test results, reviewed documented beta blocker date and time   Airway Mallampati: III  TM Distance: >3 FB Neck ROM: Full    Dental no notable dental hx. (+) Teeth Intact, Dental Advisory Given   Pulmonary neg pulmonary ROS, sleep apnea , former smoker,    Pulmonary exam normal breath sounds clear to auscultation       Cardiovascular Exercise Tolerance: Good hypertension, + CAD, + Past MI, + CABG and +CHF  negative cardio ROS  + Cardiac Defibrillator + Valvular Problems/Murmurs AS  Rhythm:Regular Rate:Normal + Systolic murmurs    Neuro/Psych negative neurological ROS  negative psych ROS   GI/Hepatic negative GI ROS, Neg liver ROS,   Endo/Other  negative endocrine ROSdiabetes, Insulin DependentMorbid obesity  Renal/GU negative Renal ROS  negative genitourinary   Musculoskeletal  (+) Arthritis ,   Abdominal   Peds  Hematology negative hematology ROS (+)   Anesthesia Other Findings   Reproductive/Obstetrics negative OB ROS                            Anesthesia Physical Anesthesia Plan  ASA: IV  Anesthesia Plan: General   Post-op Pain Management:    Induction: Intravenous  PONV Risk Score and Plan: 2 and Treatment may vary due to age or medical condition and Ondansetron  Airway Management Planned: Oral ETT  Additional Equipment: Arterial line, CVP and Ultrasound Guidance Line Placement  Intra-op Plan:   Post-operative Plan: Extubation in OR and Possible Post-op intubation/ventilation  Informed Consent: I have reviewed the patients History and Physical, chart, labs and discussed the procedure including the risks, benefits and alternatives for the proposed anesthesia with the patient or authorized  representative who has indicated his/her understanding and acceptance.   Dental advisory given  Plan Discussed with: CRNA  Anesthesia Plan Comments:         Anesthesia Quick Evaluation

## 2017-11-20 ENCOUNTER — Inpatient Hospital Stay (HOSPITAL_COMMUNITY): Payer: Medicare Other | Admitting: Emergency Medicine

## 2017-11-20 ENCOUNTER — Inpatient Hospital Stay (HOSPITAL_COMMUNITY): Payer: Medicare Other

## 2017-11-20 ENCOUNTER — Encounter (HOSPITAL_COMMUNITY)
Admission: RE | Disposition: A | Discharge status: Home / Self Care | Payer: Self-pay | Source: Ambulatory Visit | Attending: Thoracic Surgery (Cardiothoracic Vascular Surgery)

## 2017-11-20 ENCOUNTER — Encounter (HOSPITAL_COMMUNITY): Payer: Self-pay | Admitting: *Deleted

## 2017-11-20 ENCOUNTER — Inpatient Hospital Stay (HOSPITAL_COMMUNITY)
Admission: RE | Admit: 2017-11-20 | Discharge: 2017-11-21 | DRG: 267 | Disposition: A | Discharge status: Home / Self Care | Payer: Medicare Other | Source: Ambulatory Visit | Attending: Thoracic Surgery (Cardiothoracic Vascular Surgery) | Admitting: Thoracic Surgery (Cardiothoracic Vascular Surgery)

## 2017-11-20 DIAGNOSIS — D72829 Elevated white blood cell count, unspecified: Secondary | ICD-10-CM | POA: Diagnosis not present

## 2017-11-20 DIAGNOSIS — I130 Hypertensive heart and chronic kidney disease with heart failure and stage 1 through stage 4 chronic kidney disease, or unspecified chronic kidney disease: Secondary | ICD-10-CM | POA: Diagnosis present

## 2017-11-20 DIAGNOSIS — I5042 Chronic combined systolic (congestive) and diastolic (congestive) heart failure: Secondary | ICD-10-CM | POA: Diagnosis present

## 2017-11-20 DIAGNOSIS — D62 Acute posthemorrhagic anemia: Secondary | ICD-10-CM | POA: Diagnosis not present

## 2017-11-20 DIAGNOSIS — Z7902 Long term (current) use of antithrombotics/antiplatelets: Secondary | ICD-10-CM

## 2017-11-20 DIAGNOSIS — I10 Essential (primary) hypertension: Secondary | ICD-10-CM | POA: Diagnosis present

## 2017-11-20 DIAGNOSIS — N183 Chronic kidney disease, stage 3 unspecified: Secondary | ICD-10-CM | POA: Diagnosis present

## 2017-11-20 DIAGNOSIS — M549 Dorsalgia, unspecified: Secondary | ICD-10-CM | POA: Diagnosis present

## 2017-11-20 DIAGNOSIS — Q231 Congenital insufficiency of aortic valve: Secondary | ICD-10-CM | POA: Diagnosis not present

## 2017-11-20 DIAGNOSIS — Z6841 Body Mass Index (BMI) 40.0 and over, adult: Secondary | ICD-10-CM

## 2017-11-20 DIAGNOSIS — I447 Left bundle-branch block, unspecified: Secondary | ICD-10-CM | POA: Diagnosis not present

## 2017-11-20 DIAGNOSIS — Z951 Presence of aortocoronary bypass graft: Secondary | ICD-10-CM

## 2017-11-20 DIAGNOSIS — G4733 Obstructive sleep apnea (adult) (pediatric): Secondary | ICD-10-CM | POA: Diagnosis present

## 2017-11-20 DIAGNOSIS — E1122 Type 2 diabetes mellitus with diabetic chronic kidney disease: Secondary | ICD-10-CM | POA: Diagnosis present

## 2017-11-20 DIAGNOSIS — Z794 Long term (current) use of insulin: Secondary | ICD-10-CM

## 2017-11-20 DIAGNOSIS — J9811 Atelectasis: Secondary | ICD-10-CM

## 2017-11-20 DIAGNOSIS — M1990 Unspecified osteoarthritis, unspecified site: Secondary | ICD-10-CM | POA: Diagnosis present

## 2017-11-20 DIAGNOSIS — Z87891 Personal history of nicotine dependence: Secondary | ICD-10-CM | POA: Diagnosis not present

## 2017-11-20 DIAGNOSIS — Z006 Encounter for examination for normal comparison and control in clinical research program: Secondary | ICD-10-CM

## 2017-11-20 DIAGNOSIS — M545 Low back pain, unspecified: Secondary | ICD-10-CM | POA: Diagnosis present

## 2017-11-20 DIAGNOSIS — I255 Ischemic cardiomyopathy: Secondary | ICD-10-CM | POA: Diagnosis present

## 2017-11-20 DIAGNOSIS — Z95810 Presence of automatic (implantable) cardiac defibrillator: Secondary | ICD-10-CM | POA: Diagnosis not present

## 2017-11-20 DIAGNOSIS — R0600 Dyspnea, unspecified: Secondary | ICD-10-CM | POA: Diagnosis present

## 2017-11-20 DIAGNOSIS — I2510 Atherosclerotic heart disease of native coronary artery without angina pectoris: Secondary | ICD-10-CM | POA: Diagnosis not present

## 2017-11-20 DIAGNOSIS — G8929 Other chronic pain: Secondary | ICD-10-CM | POA: Diagnosis present

## 2017-11-20 DIAGNOSIS — I25810 Atherosclerosis of coronary artery bypass graft(s) without angina pectoris: Secondary | ICD-10-CM | POA: Diagnosis present

## 2017-11-20 DIAGNOSIS — Z952 Presence of prosthetic heart valve: Secondary | ICD-10-CM

## 2017-11-20 DIAGNOSIS — E1121 Type 2 diabetes mellitus with diabetic nephropathy: Secondary | ICD-10-CM

## 2017-11-20 DIAGNOSIS — Z6839 Body mass index (BMI) 39.0-39.9, adult: Secondary | ICD-10-CM

## 2017-11-20 DIAGNOSIS — Z888 Allergy status to other drugs, medicaments and biological substances status: Secondary | ICD-10-CM | POA: Diagnosis not present

## 2017-11-20 DIAGNOSIS — I252 Old myocardial infarction: Secondary | ICD-10-CM

## 2017-11-20 DIAGNOSIS — Z79899 Other long term (current) drug therapy: Secondary | ICD-10-CM

## 2017-11-20 DIAGNOSIS — I509 Heart failure, unspecified: Secondary | ICD-10-CM

## 2017-11-20 DIAGNOSIS — E785 Hyperlipidemia, unspecified: Secondary | ICD-10-CM | POA: Diagnosis present

## 2017-11-20 DIAGNOSIS — I350 Nonrheumatic aortic (valve) stenosis: Secondary | ICD-10-CM

## 2017-11-20 DIAGNOSIS — I081 Rheumatic disorders of both mitral and tricuspid valves: Secondary | ICD-10-CM | POA: Diagnosis not present

## 2017-11-20 DIAGNOSIS — I5030 Unspecified diastolic (congestive) heart failure: Secondary | ICD-10-CM | POA: Diagnosis not present

## 2017-11-20 HISTORY — PX: TRANSCATHETER AORTIC VALVE REPLACEMENT, TRANSFEMORAL: SHX6400

## 2017-11-20 HISTORY — DX: Presence of prosthetic heart valve: Z95.2

## 2017-11-20 HISTORY — PX: TEE WITHOUT CARDIOVERSION: SHX5443

## 2017-11-20 LAB — POCT I-STAT, CHEM 8
BUN: 12 mg/dL (ref 6–20)
BUN: 12 mg/dL (ref 6–20)
BUN: 13 mg/dL (ref 6–20)
BUN: 23 mg/dL — ABNORMAL HIGH (ref 6–20)
BUN: 23 mg/dL — ABNORMAL HIGH (ref 6–20)
BUN: 22 mg/dL — ABNORMAL HIGH (ref 6–20)
CALCIUM ION: 1.11 mmol/L — AB (ref 1.15–1.40)
CALCIUM ION: 1.11 mmol/L — AB (ref 1.15–1.40)
CALCIUM ION: 1.1 mmol/L — AB (ref 1.15–1.40)
CHLORIDE: 99 mmol/L — AB (ref 101–111)
Calcium, Ion: 1.1 mmol/L — ABNORMAL LOW (ref 1.15–1.40)
Calcium, Ion: 1.11 mmol/L — ABNORMAL LOW (ref 1.15–1.40)
Calcium, Ion: 1.11 mmol/L — ABNORMAL LOW (ref 1.15–1.40)
Chloride: 99 mmol/L — ABNORMAL LOW (ref 101–111)
Chloride: 100 mmol/L — ABNORMAL LOW (ref 101–111)
Chloride: 99 mmol/L — ABNORMAL LOW (ref 101–111)
Chloride: 98 mmol/L — ABNORMAL LOW (ref 101–111)
Chloride: 98 mmol/L — ABNORMAL LOW (ref 101–111)
Creatinine, Ser: 0.7 mg/dL (ref 0.61–1.24)
Creatinine, Ser: 0.7 mg/dL (ref 0.61–1.24)
Creatinine, Ser: 0.7 mg/dL (ref 0.61–1.24)
Creatinine, Ser: 0.9 mg/dL (ref 0.61–1.24)
Creatinine, Ser: 1 mg/dL (ref 0.61–1.24)
Creatinine, Ser: 0.9 mg/dL (ref 0.61–1.24)
GLUCOSE: 278 mg/dL — AB (ref 65–99)
Glucose, Bld: 190 mg/dL — ABNORMAL HIGH (ref 65–99)
Glucose, Bld: 232 mg/dL — ABNORMAL HIGH (ref 65–99)
Glucose, Bld: 147 mg/dL — ABNORMAL HIGH (ref 65–99)
Glucose, Bld: 156 mg/dL — ABNORMAL HIGH (ref 65–99)
Glucose, Bld: 163 mg/dL — ABNORMAL HIGH (ref 65–99)
HCT: 39 % (ref 39.0–52.0)
HCT: 24 % — ABNORMAL LOW (ref 39.0–52.0)
HEMATOCRIT: 42 % (ref 39.0–52.0)
HEMATOCRIT: 37 % — AB (ref 39.0–52.0)
HEMATOCRIT: 26 % — AB (ref 39.0–52.0)
HEMATOCRIT: 23 % — AB (ref 39.0–52.0)
HEMOGLOBIN: 14.3 g/dL (ref 13.0–17.0)
HEMOGLOBIN: 12.6 g/dL — AB (ref 13.0–17.0)
HEMOGLOBIN: 13.3 g/dL (ref 13.0–17.0)
HEMOGLOBIN: 8.2 g/dL — AB (ref 13.0–17.0)
HEMOGLOBIN: 7.8 g/dL — AB (ref 13.0–17.0)
Hemoglobin: 8.8 g/dL — ABNORMAL LOW (ref 13.0–17.0)
POTASSIUM: 4.3 mmol/L (ref 3.5–5.1)
POTASSIUM: 2.9 mmol/L — AB (ref 3.5–5.1)
Potassium: 3.7 mmol/L (ref 3.5–5.1)
Potassium: 4 mmol/L (ref 3.5–5.1)
Potassium: 3 mmol/L — ABNORMAL LOW (ref 3.5–5.1)
Potassium: 3.1 mmol/L — ABNORMAL LOW (ref 3.5–5.1)
SODIUM: 139 mmol/L (ref 135–145)
SODIUM: 138 mmol/L (ref 135–145)
SODIUM: 139 mmol/L (ref 135–145)
SODIUM: 140 mmol/L (ref 135–145)
SODIUM: 139 mmol/L (ref 135–145)
Sodium: 138 mmol/L (ref 135–145)
TCO2: 27 mmol/L (ref 22–32)
TCO2: 24 mmol/L (ref 22–32)
TCO2: 26 mmol/L (ref 22–32)
TCO2: 29 mmol/L (ref 22–32)
TCO2: 30 mmol/L (ref 22–32)
TCO2: 29 mmol/L (ref 22–32)

## 2017-11-20 LAB — POCT I-STAT 3, ART BLOOD GAS (G3+)
Acid-base deficit: 3 mmol/L — ABNORMAL HIGH (ref 0.0–2.0)
Bicarbonate: 23.6 mmol/L (ref 20.0–28.0)
O2 Saturation: 96 %
PCO2 ART: 46.3 mmHg (ref 32.0–48.0)
PH ART: 7.315 — AB (ref 7.350–7.450)
TCO2: 25 mmol/L (ref 22–32)
pO2, Arterial: 93 mmHg (ref 83.0–108.0)

## 2017-11-20 LAB — GLUCOSE, CAPILLARY
GLUCOSE-CAPILLARY: 247 mg/dL — AB (ref 65–99)
GLUCOSE-CAPILLARY: 210 mg/dL — AB (ref 65–99)
Glucose-Capillary: 145 mg/dL — ABNORMAL HIGH (ref 65–99)
Glucose-Capillary: 167 mg/dL — ABNORMAL HIGH (ref 65–99)
Glucose-Capillary: 244 mg/dL — ABNORMAL HIGH (ref 65–99)

## 2017-11-20 SURGERY — IMPLANTATION, AORTIC VALVE, TRANSCATHETER, FEMORAL APPROACH
Anesthesia: General | Site: Chest

## 2017-11-20 MED ORDER — MIDAZOLAM HCL 2 MG/2ML IJ SOLN
INTRAMUSCULAR | Status: AC
  Filled 2017-11-20: qty 2

## 2017-11-20 MED ORDER — METOPROLOL TARTRATE 5 MG/5ML IV SOLN: 2.5000 mg | INTRAVENOUS | Status: DC | PRN

## 2017-11-20 MED ORDER — MUPIROCIN 2 % EX OINT
TOPICAL_OINTMENT | Freq: Two times a day (BID) | CUTANEOUS | Status: DC
  Administered 2017-11-20 – 2017-11-21 (×3): via NASAL
  Filled 2017-11-20: qty 22

## 2017-11-20 MED ORDER — SUGAMMADEX SODIUM 200 MG/2ML IV SOLN
INTRAVENOUS | Status: DC | PRN
  Administered 2017-11-20: 260 mg via INTRAVENOUS

## 2017-11-20 MED ORDER — LIDOCAINE HCL (PF) 1 % IJ SOLN
INTRAMUSCULAR | Status: AC
  Filled 2017-11-20: qty 30

## 2017-11-20 MED ORDER — SUCCINYLCHOLINE CHLORIDE 200 MG/10ML IV SOSY
PREFILLED_SYRINGE | INTRAVENOUS | Status: DC | PRN
  Administered 2017-11-20: 140 mg via INTRAVENOUS

## 2017-11-20 MED ORDER — CLOPIDOGREL BISULFATE 75 MG PO TABS
75.0000 mg | ORAL_TABLET | Freq: Every day | ORAL | Status: DC
Start: 2017-11-21 — End: 2017-11-21
  Administered 2017-11-21: 75 mg via ORAL
  Filled 2017-11-20: qty 1

## 2017-11-20 MED ORDER — VANCOMYCIN HCL IN DEXTROSE 1-5 GM/200ML-% IV SOLN
1000.0000 mg | Freq: Once | INTRAVENOUS | Status: AC
  Administered 2017-11-20: 1000 mg via INTRAVENOUS
  Filled 2017-11-20: qty 200

## 2017-11-20 MED ORDER — ONDANSETRON HCL 4 MG/2ML IJ SOLN
INTRAMUSCULAR | Status: AC
  Filled 2017-11-20: qty 2

## 2017-11-20 MED ORDER — ONDANSETRON HCL 4 MG/2ML IJ SOLN: 4.0000 mg | Freq: Four times a day (QID) | INTRAMUSCULAR | Status: DC | PRN

## 2017-11-20 MED ORDER — CHLORHEXIDINE GLUCONATE 0.12 % MT SOLN
15.0000 mL | OROMUCOSAL | Status: AC
  Filled 2017-11-20: qty 15

## 2017-11-20 MED ORDER — TRAMADOL HCL 50 MG PO TABS: 50.0000 mg | ORAL_TABLET | ORAL | Status: DC | PRN

## 2017-11-20 MED ORDER — HEPARIN SODIUM (PORCINE) 1000 UNIT/ML IJ SOLN
INTRAMUSCULAR | Status: DC | PRN
  Administered 2017-11-20: 12000 [IU] via INTRAVENOUS

## 2017-11-20 MED ORDER — DEXAMETHASONE SODIUM PHOSPHATE 10 MG/ML IJ SOLN
INTRAMUSCULAR | Status: DC | PRN
  Administered 2017-11-20: 5 mg via INTRAVENOUS

## 2017-11-20 MED ORDER — SODIUM CHLORIDE 0.9 % IV SOLN
0.0000 ug/min | INTRAVENOUS | Status: DC
  Filled 2017-11-20: qty 2

## 2017-11-20 MED ORDER — POTASSIUM CHLORIDE 10 MEQ/50ML IV SOLN
10.0000 meq | INTRAVENOUS | Status: AC
  Filled 2017-11-20: qty 50

## 2017-11-20 MED ORDER — FAMOTIDINE IN NACL 20-0.9 MG/50ML-% IV SOLN
20.0000 mg | Freq: Two times a day (BID) | INTRAVENOUS | Status: AC
  Administered 2017-11-20 (×2): 20 mg via INTRAVENOUS
  Filled 2017-11-20 (×2): qty 50

## 2017-11-20 MED ORDER — NITROGLYCERIN IN D5W 200-5 MCG/ML-% IV SOLN: 0.0000 ug/min | INTRAVENOUS | Status: DC

## 2017-11-20 MED ORDER — SUGAMMADEX SODIUM 200 MG/2ML IV SOLN
INTRAVENOUS | Status: AC
  Filled 2017-11-20: qty 2

## 2017-11-20 MED ORDER — OXYCODONE HCL 5 MG PO TABS
5.0000 mg | ORAL_TABLET | ORAL | Status: DC | PRN
  Administered 2017-11-21: 5 mg via ORAL
  Filled 2017-11-20: qty 1

## 2017-11-20 MED ORDER — ONDANSETRON HCL 4 MG/2ML IJ SOLN
INTRAMUSCULAR | Status: DC | PRN
  Administered 2017-11-20: 4 mg via INTRAVENOUS

## 2017-11-20 MED ORDER — SODIUM CHLORIDE 0.9 % IV SOLN
INTRAVENOUS | Status: DC | PRN
  Administered 2017-11-20: 09:00:00 1500 mL

## 2017-11-20 MED ORDER — CHLORHEXIDINE GLUCONATE 0.12 % MT SOLN
15.0000 mL | Freq: Once | OROMUCOSAL | Status: AC
  Administered 2017-11-20: 15 mL via OROMUCOSAL
  Filled 2017-11-20: qty 15

## 2017-11-20 MED ORDER — LACTATED RINGERS IV SOLN
INTRAVENOUS | Status: DC | PRN
  Administered 2017-11-20: 07:00:00 via INTRAVENOUS

## 2017-11-20 MED ORDER — 0.9 % SODIUM CHLORIDE (POUR BTL) OPTIME
TOPICAL | Status: DC | PRN
  Administered 2017-11-20: 5000 mL

## 2017-11-20 MED ORDER — CHLORHEXIDINE GLUCONATE 4 % EX LIQD
60.0000 mL | Freq: Once | CUTANEOUS | Status: DC
  Administered 2017-11-20: 4 via TOPICAL

## 2017-11-20 MED ORDER — CEFAZOLIN SODIUM-DEXTROSE 2-4 GM/100ML-% IV SOLN
2.0000 g | Freq: Three times a day (TID) | INTRAVENOUS | Status: DC
  Administered 2017-11-20 – 2017-11-21 (×4): 2 g via INTRAVENOUS
  Filled 2017-11-20 (×5): qty 100

## 2017-11-20 MED ORDER — PROTAMINE SULFATE 10 MG/ML IV SOLN
INTRAVENOUS | Status: AC
  Filled 2017-11-20: qty 25

## 2017-11-20 MED ORDER — SODIUM CHLORIDE 0.9 % IV SOLN: INTRAVENOUS | Status: DC

## 2017-11-20 MED ORDER — MORPHINE SULFATE (PF) 2 MG/ML IV SOLN
1.0000 mg | INTRAVENOUS | Status: DC | PRN
  Administered 2017-11-20: 2 mg via INTRAVENOUS
  Filled 2017-11-20: qty 1

## 2017-11-20 MED ORDER — ETOMIDATE 2 MG/ML IV SOLN
INTRAVENOUS | Status: DC | PRN
  Administered 2017-11-20: 16 mg via INTRAVENOUS

## 2017-11-20 MED ORDER — DEXMEDETOMIDINE HCL IN NACL 200 MCG/50ML IV SOLN
INTRAVENOUS | Status: DC | PRN
  Administered 2017-11-20: 1 ug/kg/h via INTRAVENOUS

## 2017-11-20 MED ORDER — MIDAZOLAM HCL 2 MG/2ML IJ SOLN
INTRAMUSCULAR | Status: DC | PRN
  Administered 2017-11-20 (×2): 1 mg via INTRAVENOUS

## 2017-11-20 MED ORDER — HEPARIN SODIUM (PORCINE) 5000 UNIT/ML IJ SOLN
INTRAMUSCULAR | Status: AC
  Filled 2017-11-20 (×3): qty 1.2

## 2017-11-20 MED ORDER — SODIUM CHLORIDE 0.9 % IV SOLN: 1.0000 mL/kg/h | INTRAVENOUS | Status: AC

## 2017-11-20 MED ORDER — ETOMIDATE 2 MG/ML IV SOLN
INTRAVENOUS | Status: AC
  Filled 2017-11-20: qty 20

## 2017-11-20 MED ORDER — MIDAZOLAM HCL 2 MG/2ML IJ SOLN: 2.0000 mg | INTRAMUSCULAR | Status: DC | PRN

## 2017-11-20 MED ORDER — SUCCINYLCHOLINE CHLORIDE 200 MG/10ML IV SOSY
PREFILLED_SYRINGE | INTRAVENOUS | Status: AC
  Filled 2017-11-20: qty 10

## 2017-11-20 MED ORDER — CLEVIDIPINE BUTYRATE 0.5 MG/ML IV EMUL
0.0000 mg/h | INTRAVENOUS | Status: DC
  Administered 2017-11-20: 1 mg/h via INTRAVENOUS
  Filled 2017-11-20: qty 50

## 2017-11-20 MED ORDER — INSULIN ASPART 100 UNIT/ML ~~LOC~~ SOLN
0.0000 [IU] | SUBCUTANEOUS | Status: DC
  Administered 2017-11-20: 4 [IU] via SUBCUTANEOUS
  Administered 2017-11-20: 11 [IU] via SUBCUTANEOUS
  Administered 2017-11-20 – 2017-11-21 (×3): 7 [IU] via SUBCUTANEOUS

## 2017-11-20 MED ORDER — ALBUMIN HUMAN 5 % IV SOLN: 250.0000 mL | INTRAVENOUS | Status: AC | PRN

## 2017-11-20 MED ORDER — FENTANYL CITRATE (PF) 250 MCG/5ML IJ SOLN
INTRAMUSCULAR | Status: DC | PRN
  Administered 2017-11-20 (×2): 50 ug via INTRAVENOUS

## 2017-11-20 MED ORDER — EPHEDRINE 5 MG/ML INJ
INTRAVENOUS | Status: AC
  Filled 2017-11-20: qty 10

## 2017-11-20 MED ORDER — ROCURONIUM BROMIDE 10 MG/ML (PF) SYRINGE
PREFILLED_SYRINGE | INTRAVENOUS | Status: DC | PRN
  Administered 2017-11-20: 50 mg via INTRAVENOUS
  Administered 2017-11-20: 10 mg via INTRAVENOUS
  Administered 2017-11-20: 20 mg via INTRAVENOUS

## 2017-11-20 MED ORDER — ASPIRIN EC 325 MG PO TBEC: 650.0000 mg | DELAYED_RELEASE_TABLET | Freq: Two times a day (BID) | ORAL | Status: DC

## 2017-11-20 MED ORDER — SODIUM CHLORIDE 0.9 % IV SOLN
1.0000 mL/kg/h | INTRAVENOUS | Status: DC
  Administered 2017-11-20: 1 mL/kg/h via INTRAVENOUS

## 2017-11-20 MED ORDER — LIDOCAINE HCL (CARDIAC) 20 MG/ML IV SOLN
INTRAVENOUS | Status: AC
  Filled 2017-11-20: qty 5

## 2017-11-20 MED ORDER — PHENYLEPHRINE 40 MCG/ML (10ML) SYRINGE FOR IV PUSH (FOR BLOOD PRESSURE SUPPORT)
PREFILLED_SYRINGE | INTRAVENOUS | Status: AC
  Filled 2017-11-20: qty 10

## 2017-11-20 MED ORDER — CHLORHEXIDINE GLUCONATE 4 % EX LIQD: 60.0000 mL | Freq: Once | CUTANEOUS | Status: DC

## 2017-11-20 MED ORDER — ROCURONIUM BROMIDE 10 MG/ML (PF) SYRINGE
PREFILLED_SYRINGE | INTRAVENOUS | Status: AC
  Filled 2017-11-20: qty 5

## 2017-11-20 MED ORDER — PROTAMINE SULFATE 10 MG/ML IV SOLN
INTRAVENOUS | Status: DC | PRN
  Administered 2017-11-20: 50 mg via INTRAVENOUS
  Administered 2017-11-20: 10 mg via INTRAVENOUS
  Administered 2017-11-20: 60 mg via INTRAVENOUS

## 2017-11-20 MED ORDER — LACTATED RINGERS IV SOLN: 500.0000 mL | Freq: Once | INTRAVENOUS | Status: DC | PRN

## 2017-11-20 MED ORDER — DEXAMETHASONE SODIUM PHOSPHATE 10 MG/ML IJ SOLN
INTRAMUSCULAR | Status: AC
  Filled 2017-11-20: qty 1

## 2017-11-20 MED ORDER — IODIXANOL 320 MG/ML IV SOLN
INTRAVENOUS | Status: DC | PRN
  Administered 2017-11-20: 39.6 mL via INTRAVENOUS

## 2017-11-20 MED ORDER — FENTANYL CITRATE (PF) 250 MCG/5ML IJ SOLN
INTRAMUSCULAR | Status: AC
  Filled 2017-11-20: qty 5

## 2017-11-20 MED ORDER — PROPOFOL 10 MG/ML IV BOLUS
INTRAVENOUS | Status: DC | PRN
  Administered 2017-11-20: 60 mg via INTRAVENOUS
  Administered 2017-11-20: 50 mg via INTRAVENOUS

## 2017-11-20 MED ORDER — CHLORHEXIDINE GLUCONATE 4 % EX LIQD: 30.0000 mL | CUTANEOUS | Status: DC

## 2017-11-20 SURGICAL SUPPLY — 98 items
ADH SKN CLS APL DERMABOND .7 (GAUZE/BANDAGES/DRESSINGS) ×2
BAG DECANTER FOR FLEXI CONT (MISCELLANEOUS) ×2 IMPLANT
BAG SNAP BAND KOVER 36X36 (MISCELLANEOUS) ×8 IMPLANT
BLADE CLIPPER SURG (BLADE) ×3 IMPLANT
BLADE STERNUM SYSTEM 6 (BLADE) ×4 IMPLANT
CABLE ADAPT CONN TEMP 6FT (ADAPTER) ×4 IMPLANT
CANISTER SUCT 3000ML PPV (MISCELLANEOUS) ×2 IMPLANT
CANNULA FEM VENOUS REMOTE 22FR (CANNULA) IMPLANT
CANNULA OPTISITE PERFUSION 16F (CANNULA) IMPLANT
CANNULA OPTISITE PERFUSION 18F (CANNULA) IMPLANT
CATH DIAG EXPO 6F VENT PIG 145 (CATHETERS) ×8 IMPLANT
CATH EXPO 5FR AL1 (CATHETERS) ×4 IMPLANT
CATH INFINITI 6F AL2 (CATHETERS) ×4 IMPLANT
CATH S G BIP PACING (SET/KITS/TRAYS/PACK) ×4 IMPLANT
CLIP VESOCCLUDE MED 24/CT (CLIP) ×2 IMPLANT
CLIP VESOCCLUDE SM WIDE 24/CT (CLIP) IMPLANT
CONT SPEC 4OZ CLIKSEAL STRL BL (MISCELLANEOUS) ×6 IMPLANT
COVER BACK TABLE 80X110 HD (DRAPES) ×4 IMPLANT
COVER DOME SNAP 22 D (MISCELLANEOUS) ×8 IMPLANT
COVER PROBE W GEL 5X96 (DRAPES) ×4 IMPLANT
CRADLE DONUT ADULT HEAD (MISCELLANEOUS) ×4 IMPLANT
DERMABOND ADVANCED (GAUZE/BANDAGES/DRESSINGS) ×2
DERMABOND ADVANCED .7 DNX12 (GAUZE/BANDAGES/DRESSINGS) ×2 IMPLANT
DEVICE CLOSURE PERCLS PRGLD 6F (VASCULAR PRODUCTS) ×4 IMPLANT
DRAPE INCISE IOBAN 66X45 STRL (DRAPES) IMPLANT
DRSG TEGADERM 4X4.75 (GAUZE/BANDAGES/DRESSINGS) ×4 IMPLANT
ELECT CAUTERY BLADE 6.4 (BLADE) ×4 IMPLANT
ELECT REM PT RETURN 9FT ADLT (ELECTROSURGICAL) ×8
ELECTRODE REM PT RTRN 9FT ADLT (ELECTROSURGICAL) ×4 IMPLANT
FELT TEFLON 6X6 (MISCELLANEOUS) ×2 IMPLANT
FEMORAL VENOUS CANN RAP (CANNULA) IMPLANT
GAUZE SPONGE 4X4 12PLY STRL (GAUZE/BANDAGES/DRESSINGS) ×4 IMPLANT
GLOVE BIO SURGEON STRL SZ7.5 (GLOVE) ×2 IMPLANT
GLOVE BIO SURGEON STRL SZ8 (GLOVE) ×6 IMPLANT
GLOVE EUDERMIC 7 POWDERFREE (GLOVE) ×1 IMPLANT
GLOVE ORTHO TXT STRL SZ7.5 (GLOVE) ×4 IMPLANT
GOWN STRL REUS W/ TWL LRG LVL3 (GOWN DISPOSABLE) ×6 IMPLANT
GOWN STRL REUS W/ TWL XL LVL3 (GOWN DISPOSABLE) ×8 IMPLANT
GOWN STRL REUS W/TWL LRG LVL3 (GOWN DISPOSABLE) ×12
GOWN STRL REUS W/TWL XL LVL3 (GOWN DISPOSABLE) ×8
GUIDEWIRE SAF TJ AMPL .035X180 (WIRE) ×4 IMPLANT
GUIDEWIRE SAFE TJ AMPLATZ EXST (WIRE) ×6 IMPLANT
GUIDEWIRE STRAIGHT .035 260CM (WIRE) ×4 IMPLANT
INSERT FOGARTY SM (MISCELLANEOUS) IMPLANT
KIT BASIN OR (CUSTOM PROCEDURE TRAY) ×4 IMPLANT
KIT DILATOR VASC 18G NDL (KITS) IMPLANT
KIT HEART LEFT (KITS) ×6 IMPLANT
KIT ROOM TURNOVER OR (KITS) ×4 IMPLANT
KIT SUCTION CATH 14FR (SUCTIONS) ×8 IMPLANT
LOOP VESSEL MAXI BLUE (MISCELLANEOUS) IMPLANT
LOOP VESSEL MINI RED (MISCELLANEOUS) IMPLANT
NDL PERC 18GX7CM (NEEDLE) ×2 IMPLANT
NEEDLE PERC 18GX7CM (NEEDLE) ×4 IMPLANT
NS IRRIG 1000ML POUR BTL (IV SOLUTION) ×18 IMPLANT
PACK ENDOVASCULAR (PACKS) ×4 IMPLANT
PAD ARMBOARD 7.5X6 YLW CONV (MISCELLANEOUS) ×8 IMPLANT
PAD ELECT DEFIB RADIOL ZOLL (MISCELLANEOUS) ×4 IMPLANT
PENCIL BUTTON HOLSTER BLD 10FT (ELECTRODE) ×4 IMPLANT
PERCLOSE PROGLIDE 6F (VASCULAR PRODUCTS) ×8
SET MICROPUNCTURE 5F STIFF (MISCELLANEOUS) ×4 IMPLANT
SHEATH BRITE TIP 6FR 35CM (SHEATH) ×4 IMPLANT
SHEATH PINNACLE 6F 10CM (SHEATH) ×8 IMPLANT
SHEATH PINNACLE 8F 10CM (SHEATH) ×7 IMPLANT
SLEEVE REPOSITIONING LENGTH 30 (MISCELLANEOUS) ×4 IMPLANT
SPONGE LAP 4X18 X RAY DECT (DISPOSABLE) ×4 IMPLANT
STOPCOCK MORSE 400PSI 3WAY (MISCELLANEOUS) ×8 IMPLANT
SUT ETHIBOND X763 2 0 SH 1 (SUTURE) IMPLANT
SUT GORETEX CV 4 TH 22 36 (SUTURE) IMPLANT
SUT GORETEX CV4 TH-18 (SUTURE) IMPLANT
SUT MNCRL AB 3-0 PS2 18 (SUTURE) IMPLANT
SUT PROLENE 5 0 C 1 36 (SUTURE) IMPLANT
SUT PROLENE 6 0 C 1 30 (SUTURE) IMPLANT
SUT SILK  1 MH (SUTURE) ×2
SUT SILK 1 MH (SUTURE) ×2 IMPLANT
SUT VIC AB 2-0 CT1 27 (SUTURE)
SUT VIC AB 2-0 CT1 TAPERPNT 27 (SUTURE) IMPLANT
SUT VIC AB 2-0 CTX 36 (SUTURE) IMPLANT
SUT VIC AB 3-0 SH 8-18 (SUTURE) IMPLANT
SYR 50ML LL SCALE MARK (SYRINGE) ×4 IMPLANT
SYR BULB IRRIGATION 50ML (SYRINGE) IMPLANT
SYR CONTROL 10ML LL (SYRINGE) IMPLANT
SYRINGE 60CC LL (MISCELLANEOUS) ×1 IMPLANT
TAPE CLOTH SURG 4X10 WHT LF (GAUZE/BANDAGES/DRESSINGS) ×2 IMPLANT
TOWEL GREEN STERILE (TOWEL DISPOSABLE) ×8 IMPLANT
TOWEL OR NON WOVEN STRL DISP B (DISPOSABLE) ×4 IMPLANT
TRANSDUCER DISP STR W/STOPCOCK (MISCELLANEOUS) ×2 IMPLANT
TRANSDUCER W/STOPCOCK (MISCELLANEOUS) ×8 IMPLANT
TRAY FOLEY SILVER 14FR TEMP (SET/KITS/TRAYS/PACK) ×4 IMPLANT
TUBE SUCT INTRACARD DLP 20F (MISCELLANEOUS) IMPLANT
TUBING ART PRESS 72  MALE/FEM (TUBING) ×2
TUBING ART PRESS 72 MALE/FEM (TUBING) IMPLANT
VALVE HEART TRANSCATH SZ3 29MM (Prosthesis & Implant Heart) ×2 IMPLANT
WIRE .035 3MM-J 145CM (WIRE) ×4 IMPLANT
WIRE AMPLATZ SS-J .035X180CM (WIRE) ×4 IMPLANT
WIRE BENTSON .035X145CM (WIRE) ×4 IMPLANT
WIRE EMERALD 3MM-J .035X150CM (WIRE) ×4 IMPLANT
WIRE EMERALD ST .035X260CM (WIRE) ×2 IMPLANT
YANKAUER SUCT BULB TIP NO VENT (SUCTIONS) IMPLANT

## 2017-11-20 NOTE — Anesthesia Postprocedure Evaluation (Signed)
Anesthesia Post Note  Patient: [name] [name]  Procedure(s) Performed: TRANSCATHETER AORTIC VALVE REPLACEMENT, TRANSFEMORAL (N/A Chest) TRANSESOPHAGEAL ECHOCARDIOGRAM (TEE) (N/A )     Patient location during evaluation: SICU Anesthesia Type: General Level of consciousness: sedated Pain management: pain level controlled Vital Signs Assessment: post-procedure vital signs reviewed and stable Respiratory status: nonlabored ventilation and spontaneous breathing Cardiovascular status: stable Postop Assessment: no apparent nausea or vomiting Anesthetic complications: no    Last Vitals:  Vitals:   11/20/17 1215 11/20/17 1230  BP: (!) 93/53 97/60  Pulse: 66 64  Resp: 20 18  Temp:    SpO2: 99% 98%    Last Pain:  Vitals:   11/20/17 1200  TempSrc:   PainSc: 0-No pain                 Dolora Ridgely,W. EDMOND

## 2017-11-20 NOTE — Progress Notes (Signed)
      [address]       [place] [address]             [phone]      Sitting up in chair  BP 98/85   Pulse 76   Temp 97.6 F (36.4 C) (Axillary)   Resp 19   Ht 5\' 10"  (1.778 m)   Wt 286 lb (129.7 kg)   SpO2 97%   BMI 41.04 kg/m   Intake/Output Summary (Last 24 hours) at 11/20/2017 1831 Last data filed at 11/20/2017 1600 Gross per 24 hour  Intake 1409.59 ml  Output 740 ml  Net 669.59 ml   Doing well early postop  Remo Lipps C. Roxan Hockey, MD [facility] [phone]

## 2017-11-20 NOTE — Op Note (Signed)
HEART AND VASCULAR CENTER   MULTIDISCIPLINARY HEART VALVE TEAM   TAVR OPERATIVE NOTE   Date of Procedure:  11/20/2017  Preoperative Diagnosis: Severe Aortic Stenosis   Postoperative Diagnosis: Same   Procedure:    Transcatheter Aortic Valve Replacement - Percutaneous  Transfemoral Approach  Edwards Sapien 3 THV (size 29 mm, model # 9600TFX, serial # [id number])   Co-Surgeons:  Valentina Gu. Roxy Manns, MD and Sherren Mocha, MD  Anesthesiologist:  Dr Ola Spurr  Echocardiographer:  Dr Meda Coffee  Pre-operative Echo Findings:  Severe aortic stenosis  Severe left ventricular systolic dysfunction  Post-operative Echo Findings:  Trace paravalvular leak  Unchanged left ventricular systolic dysfunction  BRIEF CLINICAL NOTE AND INDICATIONS FOR SURGERY Please see complete operative note of Dr Roxy Manns for patient's history and background information.   During the course of the patient's preoperative work up they have been evaluated comprehensively by a multidisciplinary team of specialists coordinated through the [facility] in the [facility].  They have been demonstrated to suffer from symptomatic severe aortic stenosis as noted above. The patient has been counseled extensively as to the relative risks and benefits of all options for the treatment of severe aortic stenosis including long term medical therapy, conventional surgery for aortic valve replacement, and transcatheter aortic valve replacement.  The patient has been independently evaluated by two cardiac surgeons including Dr. Roxy Manns and Dr. Cyndia Bent, and they are felt to be at high risk for conventional surgical aortic valve replacement.  Based upon review of all of the patient's preoperative diagnostic tests they are felt to be candidate for transcatheter aortic valve replacement using the transfemoral approach as an alternative to high risk conventional surgery.    Following the decision to  proceed with transcatheter aortic valve replacement, a discussion has been held regarding what types of management strategies would be attempted intraoperatively in the event of life-threatening complications, including whether or not the patient would be considered a candidate for the use of cardiopulmonary bypass and/or conversion to open sternotomy for attempted surgical intervention.  The patient has been advised of a variety of complications that might develop peculiar to this approach including but not limited to risks of death, stroke, paravalvular leak, aortic dissection or other major vascular complications, aortic annulus rupture, device embolization, cardiac rupture or perforation, acute myocardial infarction, arrhythmia, heart block or bradycardia requiring permanent pacemaker placement, congestive heart failure, respiratory failure, renal failure, pneumonia, infection, other late complications related to structural valve deterioration or migration, or other complications that might ultimately cause a temporary or permanent loss of functional independence or other long term morbidity.  The patient provides full informed consent for the procedure as described and all questions were answered preoperatively.  DETAILS OF THE OPERATIVE PROCEDURE  PREPARATION:   The patient is brought to the operating room on the above mentioned date and central monitoring was established by the anesthesia team including placement of a central venous catheter and radial arterial line. The patient is placed in the supine position on the operating table.  Intravenous antibiotics are administered.   General endotracheal anesthesia is induced uneventfully.  Baseline transesophageal echocardiogram is performed. The patient's chest, abdomen, both groins, and both lower extremities are prepared and draped in a sterile manner. A time out procedure is performed.   PERIPHERAL ACCESS:   Using ultrasound guidance, femoral  arterial and venous access is obtained with placement of 6 Fr sheaths on the left side.  A pigtail diagnostic catheter was passed through  the femoral arterial sheath under fluoroscopic guidance into the aortic root.  A temporary transvenous pacemaker catheter was passed through the femoral venous sheath under fluoroscopic guidance into the right ventricle.  The pacemaker was tested to ensure stable lead placement and pacemaker capture. Aortic root angiography was performed in order to determine the optimal angiographic angle for valve deployment.  TRANSFEMORAL ACCESS:  A micropuncture technique is used to access the right femoral artery under fluoroscopic and ultrasound guidance.  2 Perclose devices are deployed at 10' and 2' positions to 'PreClose' the femoral artery. An 8 French sheath is placed and then an Amplatz Superstiff wire is advanced through the sheath. This is changed out for a 16 French transfemoral E-Sheath after progressively dilating over the Superstiff wire.  An AL-2 catheter was used to direct a straight-tip exchange length wire across the native aortic valve into the left ventricle. This was exchanged out for a pigtail catheter and position was confirmed in the LV apex. Simultaneous LV and Ao pressures were recorded.  The pigtail catheter was exchanged for an Amplatz Extra-stiff wire in the LV apex.  Echocardiography was utilized to confirm appropriate wire position and no sign of entanglement in the mitral subvalvular apparatus.  TRANSCATHETER HEART VALVE DEPLOYMENT:  An Edwards Sapien 3 transcatheter heart valve (size 29 mm, model #9600TFX, serial #[id number]) was prepared and crimped per manufacturer's guidelines, and the proper orientation of the valve is confirmed on the [employer] delivery system. The valve was advanced through the introducer sheath using normal technique until in an appropriate position in the abdominal aorta beyond the sheath tip. The balloon was then retracted  and using the fine-tuning wheel was centered on the valve. The valve was then advanced across the aortic arch using appropriate flexion of the catheter. The valve was carefully positioned across the aortic valve annulus. The Commander catheter was retracted using normal technique. Once final position of the valve has been confirmed by angiographic assessment, the valve is deployed while temporarily holding ventilation and during rapid ventricular pacing to maintain systolic blood pressure < 50 mmHg and pulse pressure < 10 mmHg. The balloon inflation is held for >3 seconds after reaching full deployment volume. Once the balloon has fully deflated the balloon is retracted into the ascending aorta and valve function is assessed using echocardiography. There is felt to be trace paravalvular leak and no central aortic insufficiency.  The patient's hemodynamic recovery following valve deployment is good.  The deployment balloon and guidewire are both removed. Echo demostrated acceptable post-procedural gradients, stable mitral valve function, and trace aortic insufficiency.   PROCEDURE COMPLETION:  The sheath was removed and femoral artery closure is performed using the 2 previously deployed Perclose devices.  Protamine is administered once femoral arterial repair was complete. The site is clear with no evidence of bleeding or hematoma after the sutures are tightened. The temporary pacemaker, pigtail catheters and femoral sheaths were removed with manual pressure used for hemostasis.   The patient tolerated the procedure well and is transported to the surgical intensive care in stable condition. There were no immediate intraoperative complications. All sponge instrument and needle counts are verified correct at completion of the operation.   The patient received a total of 39.6 mL of intravenous contrast during the procedure.   Sherren Mocha, MD 11/20/2017 10:18 AM

## 2017-11-20 NOTE — Anesthesia Procedure Notes (Signed)
Procedure Name: Intubation Date/Time: 11/20/2017 7:53 AM Performed by: Clearnce Sorrel, CRNA Pre-anesthesia Checklist: Patient identified, Emergency Drugs available, Suction available and Patient being monitored Patient Re-evaluated:Patient Re-evaluated prior to induction Oxygen Delivery Method: Circle System Utilized Preoxygenation: Pre-oxygenation with 100% oxygen Induction Type: IV induction Ventilation: Mask ventilation without difficulty Laryngoscope Size: Glidescope and 4 Grade View: Grade II Tube type: Subglottic suction tube Tube size: 7.5 mm Number of attempts: 1 Airway Equipment and Method: Stylet and Oral airway Placement Confirmation: ETT inserted through vocal cords under direct vision,  positive ETCO2 and breath sounds checked- equal and bilateral Secured at: 23 cm Tube secured with: Tape Dental Injury: Teeth and Oropharynx as per pre-operative assessment  Comments: Glidescope- morbid obesity, large neck, small mouth opening.

## 2017-11-20 NOTE — Anesthesia Procedure Notes (Signed)
Arterial Line Insertion Start/End3/26/2019 6:54 AM, 11/20/2017 6:54 AM Performed by: CRNA  Patient location: Pre-op. Preanesthetic checklist: patient identified, IV checked, site marked, risks and benefits discussed, surgical consent, monitors and equipment checked, pre-op evaluation, timeout performed and anesthesia consent Lidocaine 1% used for infiltration Right, radial was placed Catheter size: 20 Fr Hand hygiene performed  and maximum sterile barriers used   Attempts: 1 Procedure performed without using ultrasound guided technique. Following insertion, dressing applied. Post procedure assessment: normal and unchanged

## 2017-11-20 NOTE — Anesthesia Procedure Notes (Signed)
Central Venous Catheter Insertion Performed by: Annye Asa, MD, anesthesiologist Start/End3/26/2019 6:33 AM, 11/20/2017 6:49 AM Patient location: Pre-op. Preanesthetic checklist: patient identified, IV checked, risks and benefits discussed, surgical consent, monitors and equipment checked, pre-op evaluation, timeout performed and anesthesia consent Position: supine Lidocaine 1% used for infiltration and patient sedated Hand hygiene performed , maximum sterile barriers used  and Seldinger technique used Catheter size: 8 Fr Central line was placed.Double lumen Procedure performed using ultrasound guided technique. Ultrasound Notes:anatomy identified, needle tip was noted to be adjacent to the nerve/plexus identified and no ultrasound evidence of intravascular and/or intraneural injection Attempts: 1 Following insertion, line sutured, dressing applied and Biopatch. Post procedure assessment: blood return through all ports, free fluid flow and no air  Patient tolerated the procedure well with no immediate complications. Additional procedure comments: CVP: Timeout, sterile prep, drape, FBP R neck. Supine position.  1% lido local, finder and trocar RIJ 1st pass with US guidance.  2 lumen placed over J wire. Biopatch and sterile dressing on.  Patient tolerated well.  VSS.  Jenita Seashore, MD.

## 2017-11-20 NOTE — Progress Notes (Signed)
Patient out of bed to chair with minimal assistance.  No bleeding, hematoma, or complaints of pain, numbness or tingling noted at this time.  Tolerating sitting in the chair.  Family left for the day, contact numbers obtained.  Monitoring continued.

## 2017-11-20 NOTE — Interval H&P Note (Signed)
History and Physical Interval Note:  11/20/2017 5:46 AM  [name] [name]  has presented today for surgery, with the diagnosis of Severe Aortic Stenosis  The various methods of treatment have been discussed with the patient and family. After consideration of risks, benefits and other options for treatment, the patient has consented to  Procedure(s): TRANSCATHETER AORTIC VALVE REPLACEMENT, TRANSFEMORAL (N/A) TRANSESOPHAGEAL ECHOCARDIOGRAM (TEE) (N/A) as a surgical intervention .  The patient's history has been reviewed, patient examined, no change in status, stable for surgery.  I have reviewed the patient's chart and labs.  Questions were answered to the patient's satisfaction.     Rexene Alberts

## 2017-11-20 NOTE — Progress Notes (Signed)
6 French sheath removed from left femoral artery and from left femoral vein.  Pressure held x 20 minutes.  Site looks good with no hematoma.  Vitals stable throughout sheath removal.  RN will continue to monitor site.  Dressed with 4x4 and tegaderm.  Instructions given for bedrest.

## 2017-11-20 NOTE — Progress Notes (Signed)
[employer] rep at bedside, reactivated ICD.  Patient remains stable with no signs of bleeding.  Monitoring as per protocol.

## 2017-11-20 NOTE — OR Nursing (Signed)
Twenty minute call to SICU charge nurse at [phone]. Spoke to [facility]. Cath Lab also notified of timing.

## 2017-11-20 NOTE — Transfer of Care (Signed)
Immediate Anesthesia Transfer of Care Note  Patient: [name] [name]  Procedure(s) Performed: TRANSCATHETER AORTIC VALVE REPLACEMENT, TRANSFEMORAL (N/A Chest) TRANSESOPHAGEAL ECHOCARDIOGRAM (TEE) (N/A )  Patient Location: SICU  Anesthesia Type:General  Level of Consciousness: sedated  Airway & Oxygen Therapy: Patient Spontanous Breathing and Patient connected to face mask oxygen  Post-op Assessment: Report given to RN, Post -op Vital signs reviewed and stable and Patient moving all extremities  Post vital signs: Reviewed and stable  Last Vitals:  Vitals Value Taken Time  BP    Temp    Pulse    Resp    SpO2      Last Pain:  Vitals:   11/20/17 0607  TempSrc:   PainSc: 0-No pain         Complications: No apparent anesthesia complications

## 2017-11-20 NOTE — Op Note (Signed)
[facility]   MULTIDISCIPLINARY HEART VALVE TEAM   TAVR OPERATIVE NOTE   Date of Procedure:  11/20/2017  Preoperative Diagnosis: Severe Aortic Stenosis   Postoperative Diagnosis: Same   Procedure:    Transcatheter Aortic Valve Replacement - Percutaneous Right Transfemoral Approach  Edwards Sapien 3 THV (size 29 mm, model # 9600TFX, serial # [id number])   Co-Surgeons:  Valentina Gu. Roxy Manns, MD and Sherren Mocha, MD  Anesthesiologist:  Arabella Merles, MD  Echocardiographer:  Ena Dawley, MD  Pre-operative Echo Findings:  [place] type I bicuspid aortic valve  Severe aortic stenosis  Severe left ventricular systolic dysfunction  Mild-moderate mitral regurgitation  Post-operative Echo Findings:  Trivial paravalvular leak  Unchanged left ventricular systolic function   BRIEF CLINICAL NOTE AND INDICATIONS FOR SURGERY  Patient is a 67 year old morbidly obese male with history of coronary artery disease status post coronary artery bypass grafting in 2001, vein graft disease, 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  Patient's cardiac history dates back to 2001 he presented with an inferior wall myocardial infarction.  He was treated with PCI using a bare metal stent.  He developed restenosis requiring repeat angioplasty.  Ultimately he underwent coronary artery bypass grafting x6 in 2001 for unstable angina.  Grafts placed at the time of surgery included left internal mammary artery to the distal left anterior descending coronary artery, saphenous vein graft to the intermediate branch,  sequential saphenous vein graft to the second obtuse marginal branch of the left circumflex and the posterior lateral branch off the distal right coronary artery, and sequential saphenous vein graft to the acute marginal branch and the posterior descending coronary artery is.  Patient's postoperative recovery was uneventful.  He did well for a prolonged period of time and was followed for many years by Dr. Lattie Haw.  He developed vein graft disease and underwent PCI and stenting of the saphenous vein graft to the ramus intermediate branch.  In 2015 echocardiogram revealed severe left ventricular systolic dysfunction with ejection fraction estimated less than 15%.  Diagnostic catheterization performed at that time revealed severe native coronary artery disease with continued patency of left internal mammary artery to the distal left anterior descending coronary artery, patent saphenous vein graft to the obtuse marginal branch and the right posterior lateral branch, patent saphenous vein graft to the acute marginal with totally occluded limb to the posterior descending coronary artery, and patency of the stent in the saphenous vein graft to the ramus intermediate branch with eccentric 50-70% proximal stenosis in that vein graft.  He was treated medically.  In 2017 the patient was hospitalized with acute exacerbation of of congestive heart failure.  Follow-up echocardiogram performed at that time revealed left ventricular ejection fraction estimated 15%.  He was noted to have moderate aortic stenosis at that time with peak velocity across the aortic valve measured 2.9 m/s corresponding to mean transvalvular gradient estimated 22 mmHg.  The aortic valve leaflets were thickened but difficult to visualize.  Left ventricular ejection fraction did not improve with medical therapy.  He eventually underwent implantation of an ICD for primary prevention in September 2017.Marland Kitchen  His defibrillator has never fired.  In January  2018 he was hospitalized with another acute exacerbation of congestive  heart failure.  He was diuresed 15 pounds with intravenous Lasix and clinically improved.  He was seen in follow-up recently by Dr. Harl Bowie and follow-up echocardiogram revealed severe left ventricular systolic dysfunction with significant progression in the severity of aortic stenosis.  Peak velocity across the aortic valve was reported 3.2 m/s corresponding to mean transvalvular gradient estimated 26 mmHg with DVI reported 0.18.  The distal apex of the left ventricle was not well visualized, and mural thrombus could not be ruled out.  The patient was referred to the multidisciplinary heart valve clinic and underwent left and right heart catheterization by Dr. Burt Knack on October 05, 2017.  Coronary angiography looks similar to previous catheterizations, with severe native coronary artery disease including total occlusion of the right coronary artery, left circumflex coronary artery and left anterior descending coronary artery.  There was continued patency of left internal mammary artery to the distal left anterior descending coronary artery, the saphenous vein graft to the ramus intermediate branch, saphenous vein graft to the obtuse marginal and posterolateral branch of the distal right coronary artery, and the saphenous vein graft to the posterior descending coronary artery.  Mean transvalvular gradient across the aortic valve was measured 27 mmHg, consistent with low flow, low gradient severe aortic stenosis.   Repeat transthoracic echocardiogram performed October 08, 2017 revealed persistent severe left ventricular systolic dysfunction with ejection fraction estimated 15%, but no sign of left ventricular mural thrombus.  Severity of aortic stenosis had progressed substantially with peak velocity across the aortic valve measured 3.7 m/s corresponding to mean transvalvular gradient estimated 34 mmHg.  The DVI was reported 0.19.  Patient  subsequently underwent CT angiography and was referred for surgical consultation.  During the course of the patient's preoperative work up they have been evaluated comprehensively by a multidisciplinary team of specialists coordinated through the [facility] the [facility].  They have been demonstrated to suffer from symptomatic severe aortic stenosis as noted above. The patient has been counseled extensively as to the relative risks and benefits of all options for the treatment of severe aortic stenosis including long term medical therapy, conventional surgery for aortic valve replacement, and transcatheter aortic valve replacement.  All questions have been answered, and the patient provides full informed consent for the operation as described.   DETAILS OF THE OPERATIVE PROCEDURE  PREPARATION:    The patient is brought to the operating room on the above mentioned date and central monitoring was established by the anesthesia team including placement of a central venous line and radial arterial line. The patient is placed in the supine position on the operating table.  Intravenous antibiotics are administered.  General endotracheal anesthesia is induced uneventfully.  A Foley catheter is placed.  Baseline transesophageal echocardiogram was performed. The patient's chest, abdomen, both groins, and both lower extremities are prepared and draped in a sterile manner. A time out procedure is performed.   PERIPHERAL ACCESS:    Using the modified Seldinger technique, femoral arterial and venous access was obtained with placement of 6 Fr sheaths on the left side.  A pigtail diagnostic catheter was passed through the left arterial sheath under fluoroscopic guidance into the aortic root.  A temporary transvenous pacemaker catheter was passed through the left femoral venous sheath under fluoroscopic guidance into the right ventricle.  The pacemaker was  tested to ensure stable lead placement and pacemaker capture. Aortic root angiography was performed in order to determine the optimal angiographic angle  for valve deployment.   TRANSFEMORAL ACCESS:   Percutaneous transfemoral access and sheath placement was performed by Dr. Burt Knack using ultrasound guidance.  The right common femoral artery was cannulated using a micropuncture needle and appropriate location was verified using hand injection angiogram.  A pair of Abbott Perclose percutaneous closure devices were placed and a 6 French sheath replaced into the femoral artery.  The patient was heparinized systemically and ACT verified > 250 seconds.    A 16 Fr transfemoral E-sheath was introduced into the right common femoral artery after progressively dilating over an Amplatz superstiff wire. An AL-2 catheter was used to direct a straight-tip exchange length wire across the native aortic valve into the left ventricle. This was exchanged out for a pigtail catheter and position was confirmed in the LV apex. Simultaneous LV and Ao pressures were recorded.  The pigtail catheter was exchanged for an Amplatz Extra-stiff wire in the LV apex.  Echocardiography was utilized to confirm appropriate wire position and no sign of entanglement in the mitral subvalvular apparatus.   TRANSCATHETER HEART VALVE DEPLOYMENT:   An Edwards Sapien 3 transcatheter heart valve (size 29 mm, model #9600TFX, serial #[id number]) was prepared and crimped per manufacturer's guidelines, and the proper orientation of the valve is confirmed on the [employer] delivery system. The valve was advanced through the introducer sheath using normal technique until in an appropriate position in the abdominal aorta beyond the sheath tip. The balloon was then retracted and using the fine-tuning wheel was centered on the valve. The valve was then advanced across the aortic arch using appropriate flexion of the catheter. The valve was carefully  positioned across the aortic valve annulus. The Commander catheter was retracted using normal technique. Once final position of the valve has been confirmed by angiographic assessment, the valve is deployed while temporarily holding ventilation and during rapid ventricular pacing to maintain systolic blood pressure < 50 mmHg and pulse pressure < 10 mmHg. The balloon inflation is held for >3 seconds after reaching full deployment volume. Once the balloon has fully deflated the balloon is retracted into the ascending aorta and valve function is assessed using echocardiography. There is felt to be trivial paravalvular leak and no central aortic insufficiency.  The patient's hemodynamic recovery following valve deployment is good.  The deployment balloon and guidewire are both removed.    PROCEDURE COMPLETION:   The sheath was removed and femoral artery closure performed by Dr Burt Knack.  Protamine was administered once femoral arterial repair was complete. The temporary pacemaker, pigtail catheters and femoral sheaths were removed with manual pressure used for hemostasis.   The patient tolerated the procedure well and is transported to the surgical intensive care in stable condition. There were no immediate intraoperative complications. All sponge instrument and needle counts are verified correct at completion of the operation.   No blood products were administered during the operation.  The patient received a total of 39.6 mL of intravenous contrast during the procedure.   Rexene Alberts, MD 11/20/2017 9:41 AM

## 2017-11-20 NOTE — OR Nursing (Signed)
Forty-five minute call to SICU charge nurse at [phone].

## 2017-11-21 ENCOUNTER — Inpatient Hospital Stay (HOSPITAL_COMMUNITY): Payer: Medicare Other

## 2017-11-21 ENCOUNTER — Other Ambulatory Visit: Payer: Self-pay

## 2017-11-21 ENCOUNTER — Encounter (HOSPITAL_COMMUNITY): Payer: Self-pay | Admitting: Cardiovascular Disease

## 2017-11-21 DIAGNOSIS — Z952 Presence of prosthetic heart valve: Secondary | ICD-10-CM

## 2017-11-21 DIAGNOSIS — I350 Nonrheumatic aortic (valve) stenosis: Secondary | ICD-10-CM

## 2017-11-21 DIAGNOSIS — Z006 Encounter for examination for normal comparison and control in clinical research program: Secondary | ICD-10-CM

## 2017-11-21 DIAGNOSIS — I5030 Unspecified diastolic (congestive) heart failure: Secondary | ICD-10-CM

## 2017-11-21 LAB — CBC
HEMATOCRIT: 38.6 % — AB (ref 39.0–52.0)
HEMOGLOBIN: 12.9 g/dL — AB (ref 13.0–17.0)
MCH: 31.6 pg (ref 26.0–34.0)
MCHC: 33.4 g/dL (ref 30.0–36.0)
MCV: 94.6 fL (ref 78.0–100.0)
Platelets: 200 10*3/uL (ref 150–400)
RBC: 4.08 MIL/uL — AB (ref 4.22–5.81)
RDW: 14.4 % (ref 11.5–15.5)
WBC: 12.8 10*3/uL — AB (ref 4.0–10.5)

## 2017-11-21 LAB — GLUCOSE, CAPILLARY
Glucose-Capillary: 207 mg/dL — ABNORMAL HIGH (ref 65–99)
Glucose-Capillary: 169 mg/dL — ABNORMAL HIGH (ref 65–99)
Glucose-Capillary: 236 mg/dL — ABNORMAL HIGH (ref 65–99)

## 2017-11-21 LAB — BASIC METABOLIC PANEL
ANION GAP: 9 (ref 5–15)
BUN: 11 mg/dL (ref 6–20)
CHLORIDE: 104 mmol/L (ref 101–111)
CO2: 25 mmol/L (ref 22–32)
Calcium: 8.2 mg/dL — ABNORMAL LOW (ref 8.9–10.3)
Creatinine, Ser: 1.01 mg/dL (ref 0.61–1.24)
GFR calc Af Amer: >60 mL/min (ref >60)
GFR calc non Af Amer: >60 mL/min (ref >60)
GLUCOSE: 186 mg/dL — AB (ref 65–99)
POTASSIUM: 3.7 mmol/L (ref 3.5–5.1)
Sodium: 138 mmol/L (ref 135–145)

## 2017-11-21 LAB — ECHOCARDIOGRAM COMPLETE
Height: 70 in
Weight: 4532.66 oz

## 2017-11-21 LAB — MAGNESIUM: Magnesium: 1.9 mg/dL (ref 1.7–2.4)

## 2017-11-21 MED ORDER — INSULIN ASPART 100 UNIT/ML ~~LOC~~ SOLN
0.0000 [IU] | Freq: Three times a day (TID) | SUBCUTANEOUS | Status: DC
  Administered 2017-11-21: 8 [IU] via SUBCUTANEOUS
  Administered 2017-11-21: 4 [IU] via SUBCUTANEOUS

## 2017-11-21 MED ORDER — POTASSIUM CHLORIDE CRYS ER 20 MEQ PO TBCR: 20.0000 meq | EXTENDED_RELEASE_TABLET | Freq: Two times a day (BID) | ORAL | Status: DC

## 2017-11-21 MED ORDER — INSULIN DETEMIR 100 UNIT/ML ~~LOC~~ SOLN
60.0000 [IU] | Freq: Every day | SUBCUTANEOUS | Status: DC
  Filled 2017-11-21: qty 0.6

## 2017-11-21 MED ORDER — ASPIRIN 81 MG PO TBEC: 81.0000 mg | DELAYED_RELEASE_TABLET | Freq: Every day | ORAL | Status: DC

## 2017-11-21 MED ORDER — POTASSIUM CHLORIDE CRYS ER 20 MEQ PO TBCR
40.0000 meq | EXTENDED_RELEASE_TABLET | Freq: Two times a day (BID) | ORAL | Status: DC
  Administered 2017-11-21: 40 meq via ORAL
  Filled 2017-11-21: qty 2

## 2017-11-21 MED ORDER — METOLAZONE 2.5 MG PO TABS
2.5000 mg | ORAL_TABLET | Freq: Every day | ORAL | Status: DC
  Administered 2017-11-21: 2.5 mg via ORAL
  Filled 2017-11-21: qty 1

## 2017-11-21 MED ORDER — INSULIN DETEMIR 100 UNIT/ML ~~LOC~~ SOLN: 10.0000 [IU] | Freq: Once | SUBCUTANEOUS | Status: DC

## 2017-11-21 MED ORDER — INSULIN DETEMIR 100 UNIT/ML ~~LOC~~ SOLN
20.0000 [IU] | Freq: Once | SUBCUTANEOUS | Status: AC
  Administered 2017-11-21: 20 [IU] via SUBCUTANEOUS
  Filled 2017-11-21: qty 0.2

## 2017-11-21 MED ORDER — ASPIRIN EC 81 MG PO TBEC
81.0000 mg | DELAYED_RELEASE_TABLET | Freq: Every day | ORAL | Status: DC
  Administered 2017-11-21: 81 mg via ORAL
  Filled 2017-11-21: qty 1

## 2017-11-21 MED ORDER — TORSEMIDE 20 MG PO TABS
20.0000 mg | ORAL_TABLET | Freq: Two times a day (BID) | ORAL | Status: DC
  Administered 2017-11-21: 20 mg via ORAL
  Filled 2017-11-21: qty 1

## 2017-11-21 MED ORDER — ROSUVASTATIN CALCIUM 40 MG PO TABS
40.0000 mg | ORAL_TABLET | Freq: Every day | ORAL | Status: DC
  Administered 2017-11-21: 40 mg via ORAL
  Filled 2017-11-21: qty 1

## 2017-11-21 MED FILL — Magnesium Sulfate Inj 50%: INTRAMUSCULAR | Qty: 10 | Status: AC

## 2017-11-21 MED FILL — Potassium Chloride Inj 2 mEq/ML: INTRAVENOUS | Qty: 40 | Status: AC

## 2017-11-21 MED FILL — Dexmedetomidine HCl in NaCl 0.9% IV Soln 400 MCG/100ML: INTRAVENOUS | Qty: 100 | Status: AC

## 2017-11-21 MED FILL — Phenylephrine HCl Inj 10 MG/ML: INTRAMUSCULAR | Qty: 2 | Status: AC

## 2017-11-21 MED FILL — Sodium Chloride IV Soln 0.9%: INTRAVENOUS | Qty: 250 | Status: AC

## 2017-11-21 MED FILL — Heparin Sodium (Porcine) Inj 1000 Unit/ML: INTRAMUSCULAR | Qty: 30 | Status: AC

## 2017-11-21 NOTE — Care Management Note (Signed)
Case Management Note Marvetta Gibbons RN, BSN Unit 4E-Case Manager-- [place] coverage [phone]  Patient Details  Name: ABDULLAH RIZZI MRN: [id number] Date of Birth: 1951-06-11  Subjective/Objective:  Pt admitted s/p TAVR                  Action/Plan: PTA pt lived at home, plan to return home, no CM needs noted for transition home.   Expected Discharge Date:  11/21/17               Expected Discharge Plan:  Home/Self Care  In-House Referral:  NA  Discharge planning Services  CM Consult  Post Acute Care Choice:  NA Choice offered to:  NA  DME Arranged:    DME Agency:     HH Arranged:    HH Agency:     Status of Service:  Completed, signed off  If discussed at [place] of Stay Meetings, dates discussed:    Discharge Disposition: home/self care   Additional Comments:  Dawayne Patricia, RN 11/21/2017, 3:49 PM

## 2017-11-21 NOTE — Discharge Instructions (Signed)
ACTIVITY AND EXERCISE °• Daily activity and exercise are an important part °of your recovery. People recover at different rates °depending on their general health and type of °valve procedure. °• Most people require six to 10 weeks to feel °recovered. °• No lifting, pushing, pulling more than 10 pounds °(examples to avoid: groceries, vacuuming, °gardening, golfing): °- For one week with a procedure through the groin. °- For six weeks for procedures through the chest °wall. °- For three months for procedures through the °breast-bone. °• After the initial healing process of the access site, °we recommend cardiac rehabilitation for all TAVR °patients. Cardiac rehabilitation will help you: °- Rebuild stamina, strength and balance. °- Learn how to participate in activities safely, as well °as help you regain confidence to do so. °- Return to activities of daily living and leisure. °• Discuss attending cardiac rehabilitation at your °follow-up appointment  ° °DRIVING °• Do not drive for four weeks after the date of your °procedure. °• If you have been told by your doctor in the past °that you may not drive, you must talk with him/her °before you begin driving again. °• When you resume driving, you must have someone °with you. ° °HYGIENE °If you had a femoral (leg) procedure, you may take a shower when you return home. After the shower, pat the °site dry. Do NOT use powder, oils or lotions in your groin area until the site has completely healed. °• If you had a chest procedure, you may shower when you return home unless specifically instructed not to by °your discharging practitioner. °- DO NOT scrub incision; pat dry with a towel °- DO NOT apply any lotions, oils, powders to the incision °- No tub baths / swimming for at least six weeks. ° °SITE CARE °• You likely will have small openings in both groins °from catheters used during the procedure. If you °had a transfemoral procedure, one groin will have a °larger opening  and may be bruised or tender. °• If you had a chest procedure, you will have either a °small incision in your upper sternum (breast-bone) °or between your ribs on your left side. °- Chest wall site: The surgical incision should be °kept dry (no lotions / oils / powders) and open °to air. If you experience irritation from clothing °rubbing on the incision, a light gauze dressing °may be applied. °- Inspect your incision daily; notify your °physician if there is increased redness, swelling °or drainage from the incision. °- If the incision is located on your breast-bone °you must avoid lifting objects heavier than a °gallon of milk (eight pounds) and stretching / °twisting / pulling with your arms for at least °three months to ensure strong bone healing. ° ° °CONTACT [phone] °• Check your sites daily. Contact our office if you have °any of the following problems: °- Redness and warmth that does not go away °- Yellow or green drainage from the wound °- Fever and chills °- Increasing numbness in your legs °- Worsening pain at the site °• If you had a leg/groin procedure, it is normal to °have bruising or a soft lump at the site. It is not °normal if the lump suddenly becomes larger or °more firm. This may mean you are bleeding. If this °happens: °- Lie down °- Have someone press down hard, just above °the hole in your skin where the procedure was °performed for 15 minutes. If after holding on the °site, the lump does not become larger or harder, °they   are performing this correctly. °- If the bleeding has stopped after 15 minutes, rest °and stay laying down for at least two hours. °- If the bleeding continues, call 911 for an °ambulance. Do NOT drive yourself or have °someone else drive you. °

## 2017-11-21 NOTE — Progress Notes (Signed)
  Echocardiogram 2D Echocardiogram has been performed.  Patient was agitated during length of exam.   [name] [name] 11/21/2017, 2:03 PM

## 2017-11-21 NOTE — Progress Notes (Addendum)
TCTS DAILY ICU PROGRESS NOTE                   [address]            [place] [address]          [phone]   1 Day Post-Op Procedure(s) (LRB): TRANSCATHETER AORTIC VALVE REPLACEMENT, TRANSFEMORAL (N/A) TRANSESOPHAGEAL ECHOCARDIOGRAM (TEE) (N/A)  Total Length of Stay:  LOS: 1 day   Subjective: Feels pretty well  Objective: Vital signs in last 24 hours: Temp:  [97.1 F (36.2 C)-98.2 F (36.8 C)] 98.2 F (36.8 C) (03/27 0740) Pulse Rate:  [60-180] 91 (03/27 0700) Resp:  [15-24] 22 (03/27 0700) BP: (78-154)/(41-116) 125/67 (03/27 0700) SpO2:  [91 %-100 %] 97 % (03/27 0700) Arterial Line BP: (86-132)/(47-74) 120/71 (03/26 1430) Weight:  [283 lb 4.7 oz (128.5 kg)] 283 lb 4.7 oz (128.5 kg) (03/27 0500)  Filed Weights   11/20/17 0612 11/21/17 0500  Weight: 286 lb (129.7 kg) 283 lb 4.7 oz (128.5 kg)    Weight change: -2 lb 11.3 oz (-1.229 kg)   Hemodynamic parameters for last 24 hours:    Intake/Output from previous day: 03/26 0701 - 03/27 0700 In: 2038.7 [P.O.:240; I.V.:1798.7] Out: 740 [Urine:690; Blood:50]  Intake/Output this shift: No intake/output data recorded.  Current Meds: Scheduled Meds: . aspirin EC  81 mg Oral Daily  . chlorhexidine  15 mL Mouth/Throat NOW  . clopidogrel  75 mg Oral Daily  . insulin aspart  0-24 Units Subcutaneous TID AC & HS  . insulin detemir  20 Units Subcutaneous Once  . insulin detemir  60 Units Subcutaneous QHS  . metolazone  2.5 mg Oral Daily  . mupirocin ointment   Nasal BID  . [START ON 11/22/2017] potassium chloride SA  20 mEq Oral BID  . potassium chloride  40 mEq Oral BID WC  . rosuvastatin  40 mg Oral Daily  . torsemide  20 mg Oral BID   Continuous Infusions: . albumin human    .  ceFAZolin (ANCEF) IV Stopped (11/21/17 0546)   PRN Meds:.albumin human, metoprolol tartrate, morphine injection, ondansetron (ZOFRAN) IV, oxyCODONE, traMADol  General appearance: alert, cooperative and no  distress Neurologic: intact Heart: regular rate and rhythm and no murmur Lungs: clear to auscultation bilaterally Abdomen: soft, non-tender; bowel sounds normal; no masses,  no organomegaly Extremities: no edema' Wound: groins without hematoma or echymosis  Lab Results: CBC: Recent Labs    11/20/17 1258 11/21/17 0249  WBC  --  12.8*  HGB 7.8* 12.9*  HCT 23.0* 38.6*  PLT  --  200   BMET:  Recent Labs    11/20/17 1258 11/21/17 0249  NA 139 138  K 2.9* 3.7  CL 98* 104  CO2  --  25  GLUCOSE 163* 186*  BUN 22* 11  CREATININE 0.90 1.01  CALCIUM  --  8.2*    CMET: Lab Results  Component Value Date   WBC 12.8 (H) 11/21/2017   HGB 12.9 (L) 11/21/2017   HCT 38.6 (L) 11/21/2017   PLT 200 11/21/2017   GLUCOSE 186 (H) 11/21/2017   CHOL 116 06/25/2017   TRIG 143 06/25/2017   HDL 35 (L) 06/25/2017   LDLCALC 52 06/25/2017   ALT 42 11/15/2017   AST 42 (H) 11/15/2017   NA 138 11/21/2017   K 3.7 11/21/2017   CL 104 11/21/2017   CREATININE 1.01 11/21/2017   BUN 11 11/21/2017   CO2 25 11/21/2017   TSH  3.363 09/11/2016   PSA 6.75 (H) 01/02/2015   INR 1.07 11/15/2017   HGBA1C 7.1 (H) 11/15/2017   MICROALBUR 1.4 06/05/2014      PT/INR: No results for input(s): LABPROT, INR in the last 72 hours. Radiology: Dg Chest Port 1 View  Result Date: 11/20/2017 CLINICAL DATA:  Aortic valve replacement.  Atelectasis. EXAM: PORTABLE CHEST 1 VIEW COMPARISON:  11/15/2016. FINDINGS: Right IJ line with tip over superior vena cava. Cardiac pacer noted with lead tip over the right ventricle and stable position. Prior cardiac valve replacement. Prior CABG. Cardiomegaly mild bilateral interstitial prominence. Small right pleural effusion. Mild CHF may be present. No pneumothorax. IMPRESSION: 1.  Right IJ line noted with tip over superior vena cava. 2. Cardiac pacer with lead tip over the right ventricle in stable position. Prior cardiac valve replacement. Prior CABG. Cardiomegaly with mild  bilateral interstitial prominence and small right pleural effusion. Mild CHF may be present. Electronically Signed   By: Marcello Moores  Register   On: 11/20/2017 11:13     Assessment/Plan: S/P Procedure(s) (LRB): TRANSCATHETER AORTIC VALVE REPLACEMENT, TRANSFEMORAL (N/A) TRANSESOPHAGEAL ECHOCARDIOGRAM (TEE) (N/A)   1 doing well 2 hemodyn stable, new LBBB- will hold beta blocker, for ECHO today 3 mild leukocytosis, H/H ok 4 blood sugars variable- will resume prior RX at discharge 5 routine pulm toilet and rehab 6 poss home this afternoon vs tomorrow John Giovanni 11/21/2017 7:57 AM   I have seen and examined the patient and agree with the assessment and plan as outlined.  Looks really good and wants to go home.  No murmur on exam.  NSR w/ new LBBB.  Will hold beta blockers.  DAPT.  Check routine ECHO.  Possible d/c home later today or tomorrow  Rexene Alberts, MD 11/21/2017 8:27 AM

## 2017-11-21 NOTE — Discharge Summary (Signed)
Physician Discharge Summary  Patient ID: [name] [name] MRN: [id number] DOB/AGE: 1951-05-10 67 y.o.  Admit date: 11/20/2017 Discharge date: 11/21/2017  Admission Diagnoses: Severe aortic stenosis  Discharge Diagnoses:  Principal Problem:   S/P TAVR (transcatheter aortic valve replacement) Active Problems:   CAD (coronary artery disease) of artery bypass graft   Morbid obesity (HCC)   Congestive heart failure (HCC)   Cardiomyopathy, ischemic   Obstructive sleep apnea   Chronic lumbar pain   Diabetes mellitus, type 2 (HCC)   Acute on chronic congestive heart failure (HCC)   Essential hypertension   Dyspnea   CKD (chronic kidney disease) stage 3, GFR 30-59 ml/min (HCC)   AICD (automatic cardioverter/defibrillator) present   S/P CABG x 6   Aortic stenosis  Patient Active Problem List   Diagnosis Date Noted  . 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 Aortic stenosis   . S/P nasal septoplasty 04/25/2017  . AICD (automatic cardioverter/defibrillator) present 11/22/2016  . CKD (chronic kidney disease) stage 3, GFR 30-59 ml/min (HCC)   . Acute kidney injury ([place])   . Difficulty in walking, not elsewhere classified   . Pain in the abdomen   . Anasarca   . Constipation   . Dyspnea 09/11/2016  . Prolonged Q-T interval on ECG 09/11/2016  . Hypokalemia 09/11/2016  . Chest pain 10/22/2015  . Diabetes mellitus, type 2 ([place]) 10/22/2015  . Acute on chronic congestive heart failure ([place])   . Essential hypertension   . Coronary artery disease due to lipid rich plaque   . Chronic lumbar pain 01/01/2015  . Obstructive sleep apnea 10/02/2014  . Cardiomyopathy, ischemic 06/25/2014  . Congestive heart failure ([place]) 10/29/2013  . Right ankle pain 08/14/2013  . Hyperlipemia 05/14/2013  . CAD (coronary artery disease) of artery bypass graft 05/14/2013  . Morbid obesity ([place]) 05/14/2013  . Elevated PSA 05/14/2013  . S/P CABG x 6 12/23/1999    HPI:  Patient is a  67 year old morbidly obese male with history of coronary artery disease status post coronary artery bypass grafting in 2001, vein graft disease, 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  Patient's cardiac history dates back to 2001 he presented with an inferior wall myocardial infarction.  He was treated with PCI using a bare metal stent.  He developed restenosis requiring repeat angioplasty.  Ultimately he underwent coronary artery bypass grafting x6 in 2001 for unstable angina.  Grafts placed at the time of surgery included left internal mammary artery to the distal left anterior descending coronary artery, saphenous vein graft to the intermediate branch, sequential saphenous vein graft to the second obtuse marginal branch of the left circumflex and the posterior lateral branch off the distal right coronary artery, and sequential saphenous vein graft to the acute marginal branch and the posterior descending coronary artery is.  Patient's postoperative recovery was uneventful.  He did well for a prolonged period of time and was followed for many years by Dr. Lattie Haw.  He developed vein graft disease and underwent PCI and stenting of the saphenous vein graft to the ramus intermediate branch.  In 2015 echocardiogram revealed severe left ventricular systolic dysfunction with ejection fraction estimated less than 15%.  Diagnostic catheterization performed at that time revealed  severe native coronary artery disease with continued patency of left internal mammary artery to the distal left anterior descending coronary artery, patent saphenous vein graft  to the obtuse marginal branch and the right posterior lateral branch, patent saphenous vein graft to the acute marginal with totally occluded limb to the posterior descending coronary artery, and patency of the stent in the saphenous vein graft to the ramus intermediate branch with eccentric 50-70% proximal stenosis in that vein graft.  He was treated medically.  In 2017 the patient was hospitalized with acute exacerbation of of congestive heart failure.  Follow-up echocardiogram performed at that time revealed left ventricular ejection fraction estimated 15%.  He was noted to have moderate aortic stenosis at that time with peak velocity across the aortic valve measured 2.9 m/s corresponding to mean transvalvular gradient estimated 22 mmHg.  The aortic valve leaflets were thickened but difficult to visualize.  Left ventricular ejection fraction did not improve with medical therapy.  He eventually underwent implantation of an ICD for primary prevention in September 2017.Marland Kitchen  His defibrillator has never fired.  In January 2018 he was hospitalized with another acute exacerbation of congestive heart failure.  He was diuresed 15 pounds with intravenous Lasix and clinically improved.  He was seen in follow-up recently by Dr. Harl Bowie and follow-up echocardiogram revealed severe left ventricular systolic dysfunction with significant progression in the severity of aortic stenosis.  Peak velocity across the aortic valve was reported 3.2 m/s corresponding to mean transvalvular gradient estimated 26 mmHg with DVI reported 0.18.  The distal apex of the left ventricle was not well visualized, and mural thrombus could not be ruled out.  The patient was referred to the multidisciplinary heart valve clinic and underwent left and right heart catheterization by Dr. Burt Knack on October 05, 2017.  Coronary angiography looks similar to previous catheterizations, with severe native coronary artery disease including total occlusion of the  right coronary artery, left circumflex coronary artery and left anterior descending coronary artery.  There was continued patency of left internal mammary artery to the distal left anterior descending coronary artery, the saphenous vein graft to the ramus intermediate branch, saphenous vein graft to the obtuse marginal and posterolateral branch of the distal right coronary artery, and the saphenous vein graft to the posterior descending coronary artery.  Mean transvalvular gradient across the aortic valve was measured 27 mmHg, consistent with low flow, low gradient severe aortic stenosis.   Repeat transthoracic echocardiogram performed October 08, 2017 revealed persistent severe left ventricular systolic dysfunction with ejection fraction estimated 15%, but no sign of left ventricular mural thrombus.  Severity of aortic stenosis had progressed substantially with peak velocity across the aortic valve measured 3.7 m/s corresponding to mean transvalvular gradient estimated 34 mmHg.  The DVI was reported 0.19.  Patient subsequently underwent CT angiography and was referred for surgical consultation.  Patient is married and lives with his wife in [place].  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  He states that he still remains quite active physically, and he enjoys strenuous activity including heavy lifting.  He is a difficult historian.  He reports occasional episodes of substernal chest discomfort that are usually brought on with heavy lifting or other types of strenuous activity.  He has not had significant problems with his breathing recently.  He takes torsemide on a daily basis and uses Zaroxolyn as needed when he gets problems with fluid overload.  He denies any PND, orthopnea, or lower extremity edema.  He has  not had palpitations, dizzy spells, or syncope.  He has some chronic pain related to arthritis but he remains fairly mobile physically.  The patient was admitted electively for TA  VR  Discharged Condition: good  Hospital Course: The patient was admitted electively and taken to the operating room on 11/20/2017 at which time he underwent placement of T AVR.  He tolerated the procedure well and was taken to the surgical intensive care unit in stable condition.  Postoperative echocardiogram findings included trivial paravalvular leak and unchanged left ventricular systolic function.  Postoperative hospital course:  The patient has progressed quite nicely.  He has maintained stable hemodynamics.  He does have a new left bundle branch block on electrocardiogram.  We are currently holding resumption of beta-blocker.  He is scheduled for an echocardiogram today which will be reviewed prior to discharge.  Blood sugars have been under adequate control.  He has a mild postoperative anemia.  He has mild leukocytosis.  He is tolerating routine pulmonary toilet and rehab modalities.  At time of discharge the patient is felt to be quite stable. Post op echo has been reviewed and is acceptable for discharge.  Consults: cardiology  Significant Diagnostic Studies: Routine post operative TEE as well as next day routine TTE  Treatments: surgery:    TAVR OPERATIVE NOTE   Date of Procedure:                11/20/2017  Preoperative Diagnosis:      Severe Aortic Stenosis   Postoperative Diagnosis:    Same   Procedure:        Transcatheter Aortic Valve Replacement - Percutaneous Right Transfemoral Approach             Edwards Sapien 3 THV (size 29 mm, model # 9600TFX, serial # [id number])              Co-Surgeons:                        Valentina Gu. Roxy Manns, MD and Sherren Mocha, MD  Anesthesiologist:                  Arabella Merles, MD  Echocardiographer:              Ena Dawley, MD  Pre-operative Echo Findings: ? Sievers type I bicuspid aortic valve ? Severe aortic stenosis ? Severe left ventricular systolic dysfunction ? Mild-moderate mitral  regurgitation  Post-operative Echo Findings: ? Trivial paravalvular leak ? Unchanged left ventricular systolic function     Discharge Exam: Blood pressure 133/86, pulse 83, temperature 97.7 F (36.5 C), temperature source Oral, resp. rate 19, height 5\' 10"  (1.778 m), weight 283 lb 4.7 oz (128.5 kg), SpO2 98 %.   General appearance: alert, cooperative and no distress Neurologic: intact Heart: regular rate and rhythm and no murmur Lungs: clear to auscultation bilaterally Abdomen: soft, non-tender; bowel sounds normal; no masses,  no organomegaly Extremities: no edema' Wound: groins without hematoma or echymosis   Disposition: Discharge disposition: 01-Home or Self Care       Discharge Instructions    Discharge patient   Complete by:  As directed    Discharge disposition:  01-Home or Self Care   Discharge patient date:  11/21/2017     Allergies as of 11/21/2017      Reactions   Dyflex-g [dyphylline-guaifenesin] Hives      Medication List    STOP taking these medications   carvedilol 6.25  MG tablet Commonly known as:  COREG     TAKE these medications   aspirin 81 MG EC tablet Take 1 tablet (81 mg total) by mouth daily. Start taking on:  11/22/2017 What changed:    medication strength  how much to take  when to take this   aspirin-acetaminophen-caffeine 250-250-65 MG tablet Commonly known as:  EXCEDRIN MIGRAINE Take 2 tablets by mouth daily as needed for headache.   aspirin-sod bicarb-citric acid 325 MG Tbef tablet Commonly known as:  ALKA-SELTZER Take 650 mg by mouth daily as needed (indigestion).   CENTRUM SILVER PO Take 2 tablets by mouth daily.   clopidogrel 75 MG tablet Commonly known as:  PLAVIX Take 1 tablet (75 mg total) by mouth daily.   HYDROcodone-acetaminophen 10-325 MG tablet Commonly known as:  NORCO 1 bid prn pain What changed:    how much to take  how to take this  when to take this  reasons to take this  additional  instructions   Insulin Detemir 100 UNIT/ML Pen Commonly known as:  LEVEMIR Inject 12 units into skin each evening. May titrate up to 60 units. What changed:    how much to take  how to take this  when to take this  additional instructions   liver oil-zinc oxide 40 % ointment Commonly known as:  DESITIN Apply 1 application topically as needed for irritation.   metolazone 2.5 MG tablet Commonly known as:  ZAROXOLYN TAKE ONE TABLET BY MOUTH DAILY.   nitroGLYCERIN 0.4 MG SL tablet Commonly known as:  NITROSTAT PLACE 1 TABLET UNDER TONGUE FOR CHEST PAIN. MAY REPEAT EVERY 5 MIN UPTO 3 DOSES-NO RELIEF,CALL 911.   NOVOLOG FLEXPEN 100 UNIT/ML FlexPen Generic drug:  insulin aspart INJECT 6 UNITS INTO THE SKIN WITH EACH MEAL. MAY TITRATE UP 14 UNITS. What changed:  See the new instructions.   polycarbophil 625 MG tablet Commonly known as:  FIBERCON Take 2 tablets (1,250 mg total) by mouth daily with lunch. Hold if you have diarrhea What changed:    when to take this  additional instructions   potassium chloride SA 20 MEQ tablet Commonly known as:  K-DUR,KLOR-CON 2 tablets in the am, one tablet midday and 2 tablets qpm What changed:    how much to take  how to take this  when to take this  additional instructions   rosuvastatin 40 MG tablet Commonly known as:  CRESTOR Take 1 tablet (40 mg total) by mouth daily.   SURE COMFORT PEN NEEDLES 31G X 8 MM Misc Generic drug:  Insulin Pen Needle USING 4 TIMES DAILY.   torsemide 20 MG tablet Commonly known as:  DEMADEX TAKE (2) TABLETS BY MOUTH TWICE DAILY. What changed:  See the new instructions.   trolamine salicylate 10 % cream Commonly known as:  ASPERCREME Apply 1 application topically 2 (two) times daily as needed for muscle pain.      Follow-up Information    Rexene Alberts, MD Follow up.   Specialty:  Cardiothoracic Surgery Why:  Please see the discharge paperwork for follow-up appointments including  echocardiogram. Contact information: [name] [address] [phone]           Signed: John Giovanni 11/21/2017, 2:59 PM

## 2017-11-21 NOTE — Progress Notes (Signed)
[id number] Observed pt up with NT walking with steady gait. Pt walked 740 ft. 139/91 and sats at 98%RA. Pt stated he watches his sodium and keeps sugars under control. Did not want diet sheets as he stated he has had before and has them at home. Pt not interested in CRP 2. Encouraged pt to walk for ex but he has been using cardio trainer and wants to continue with it. Encouraged him to start slowly and give himself time to recover. He stated he wants to use it and feels comfortable doing so. Denied SOB when up. Wants to go home and looks good sitting in recliner. Graylon Good RN BSN 11/21/2017 12:23 PM

## 2017-11-22 ENCOUNTER — Telehealth: Payer: Self-pay

## 2017-11-22 ENCOUNTER — Other Ambulatory Visit: Payer: Self-pay | Admitting: Cardiology

## 2017-11-22 NOTE — Telephone Encounter (Signed)
Patient contacted regarding discharge from [facility] on 11/21/2017.  Patient understands to follow up with provider Richardson Dopp PA-C on 11/28/2017 at 8:45 AM at [facility] office. Patient understands discharge instructions? yes Patient understands medications and regiment? Yes, Confirmed the pt stopped Carvedilol Patient understands to bring all medications to this visit? Yes

## 2017-11-26 ENCOUNTER — Ambulatory Visit (INDEPENDENT_AMBULATORY_CARE_PROVIDER_SITE_OTHER): Payer: Medicare Other | Admitting: *Deleted

## 2017-11-26 DIAGNOSIS — I255 Ischemic cardiomyopathy: Secondary | ICD-10-CM

## 2017-11-26 NOTE — Progress Notes (Signed)
Remote ICD transmission.   

## 2017-11-27 ENCOUNTER — Encounter: Payer: Self-pay | Admitting: Cardiology

## 2017-11-28 ENCOUNTER — Other Ambulatory Visit: Payer: Self-pay | Admitting: Family Medicine

## 2017-11-28 ENCOUNTER — Telehealth: Payer: Self-pay | Admitting: *Deleted

## 2017-11-28 ENCOUNTER — Encounter: Payer: Self-pay | Admitting: Physician Assistant

## 2017-11-28 ENCOUNTER — Telehealth: Payer: Self-pay | Admitting: Chiropractic Medicine

## 2017-11-28 ENCOUNTER — Ambulatory Visit (INDEPENDENT_AMBULATORY_CARE_PROVIDER_SITE_OTHER): Payer: Medicare Other | Admitting: Physician Assistant

## 2017-11-28 VITALS — BP 158/78 | HR 82 | Ht 70.0 in | Wt 287.0 lb

## 2017-11-28 DIAGNOSIS — I2510 Atherosclerotic heart disease of native coronary artery without angina pectoris: Secondary | ICD-10-CM | POA: Diagnosis not present

## 2017-11-28 DIAGNOSIS — I5042 Chronic combined systolic (congestive) and diastolic (congestive) heart failure: Secondary | ICD-10-CM

## 2017-11-28 DIAGNOSIS — Z952 Presence of prosthetic heart valve: Secondary | ICD-10-CM | POA: Diagnosis not present

## 2017-11-28 DIAGNOSIS — I10 Essential (primary) hypertension: Secondary | ICD-10-CM

## 2017-11-28 DIAGNOSIS — E876 Hypokalemia: Secondary | ICD-10-CM

## 2017-11-28 DIAGNOSIS — N183 Chronic kidney disease, stage 3 unspecified: Secondary | ICD-10-CM

## 2017-11-28 DIAGNOSIS — I447 Left bundle-branch block, unspecified: Secondary | ICD-10-CM | POA: Diagnosis not present

## 2017-11-28 LAB — CBC
HEMATOCRIT: 43.8 % (ref 37.5–51.0)
Hemoglobin: 15.1 g/dL (ref 13.0–17.7)
MCH: 32.1 pg (ref 26.6–33.0)
MCHC: 34.5 g/dL (ref 31.5–35.7)
MCV: 93 fL (ref 79–97)
Platelets: 221 10*3/uL (ref 150–379)
RBC: 4.71 x10E6/uL (ref 4.14–5.80)
RDW: 14.6 % (ref 12.3–15.4)
WBC: 8.5 10*3/uL (ref 3.4–10.8)

## 2017-11-28 LAB — CUP PACEART REMOTE DEVICE CHECK
Battery Remaining Longevity: 130 mo
Date Time Interrogation Session: 20190401062603
HIGH POWER IMPEDANCE MEASURED VALUE: 97 Ohm
Implantable Lead Location: 753860
Implantable Pulse Generator Implant Date: 20170921
Lead Channel Impedance Value: 513 Ohm
Lead Channel Impedance Value: 532 Ohm
Lead Channel Pacing Threshold Amplitude: 0.75 V
Lead Channel Pacing Threshold Pulse Width: 0.4 ms
Lead Channel Setting Pacing Pulse Width: 0.4 ms
MDC IDC LEAD IMPLANT DT: 20170921
MDC IDC MSMT BATTERY VOLTAGE: 3.01 V
MDC IDC MSMT LEADCHNL RV SENSING INTR AMPL: 11.625 mV
MDC IDC MSMT LEADCHNL RV SENSING INTR AMPL: 11.625 mV
MDC IDC SET LEADCHNL RV PACING AMPLITUDE: 2.5 V
MDC IDC SET LEADCHNL RV SENSING SENSITIVITY: 0.3 mV
MDC IDC STAT BRADY RV PERCENT PACED: 0.01 %

## 2017-11-28 LAB — BASIC METABOLIC PANEL
BUN / CREAT RATIO: 17 (ref 10–24)
BUN: 18 mg/dL (ref 8–27)
CO2: 36 mmol/L — ABNORMAL HIGH (ref 20–29)
CREATININE: 1.05 mg/dL (ref 0.76–1.27)
Calcium: 9.8 mg/dL (ref 8.6–10.2)
Chloride: 91 mmol/L — ABNORMAL LOW (ref 96–106)
GFR calc non Af Amer: 73 mL/min/{1.73_m2} (ref >59)
GFR, EST AFRICAN AMERICAN: 84 mL/min/{1.73_m2} (ref >59)
Glucose: 183 mg/dL — ABNORMAL HIGH (ref 65–99)
Potassium: 3.4 mmol/L — ABNORMAL LOW (ref 3.5–5.2)
Sodium: 140 mmol/L (ref 134–144)

## 2017-11-28 MED ORDER — POTASSIUM CHLORIDE CRYS ER 20 MEQ PO TBCR: 40.0000 meq | EXTENDED_RELEASE_TABLET | Freq: Three times a day (TID) | ORAL | 0 refills | Status: DC

## 2017-11-28 MED ORDER — CARVEDILOL 6.25 MG PO TABS: 6.2500 mg | ORAL_TABLET | Freq: Two times a day (BID) | ORAL | 3 refills | Status: DC

## 2017-11-28 NOTE — Telephone Encounter (Signed)
New Message   Patient is returning call in reference to medication. Please call to discuss.

## 2017-11-28 NOTE — Progress Notes (Signed)
Cardiology Office Note:    Date:  11/28/2017   ID:  [name] [name], DOB 1950-12-19, MRN [id number]  PCP:  Kathyrn Drown, MD  Cardiologist:  Carlyle Dolly, MD  Electrophysiologist: Dr. Allegra Lai  Structural Heart:  Dr. Sherren Mocha   Referring MD: Kathyrn Drown, MD   Chief Complaint  Patient presents with  . Hospitalization Follow-up    s/p TAVR    History of Present Illness:    AVRIAN DELFAVERO is a 67 y.o. male with aortic stenosis, coronary artery disease status post inferior MI in 2000 treated with PCI and subsequently CABG, status post stenting to the SVG-RI, ischemic cardiomyopathy, combined systolic and diastolic heart failure, status post ICD in 2017, diabetes, hypertension, sleep apnea.  EF is 15%.  He was recently noted to have symptomatic severe aortic stenosis with mean gradient 34 mmHg (low-flow, low gradient).  He was evaluated by the structural heart clinic and cardiothoracic surgery and it was recommended that he undergo aortic valve replacement.  He was admitted 3/26-3/27 and underwent transcatheter aortic valve replacement via right transfemoral approach with Dr. Roxy Manns and Dr. Burt Knack.  Postop echo demonstrated trivial paravalvular leak and unchanged LV function.  Postoperative course was uneventful.  He was noted to have a new left bundle branch block.  Beta-blocker was held at discharge.  Mr. Zarazua returns for posthospitalization follow-up.  He is here with his wife today.  Since discharge, he has been doing well.  He feels that his breathing is 50% improved.  He had one episode of angina that resolved with 2 nitroglycerin.  He denies syncope, PND.  His lower extremity swelling is stable.  His weight at home is down 2 pounds.  He denies any significant bleeding issues.  He does note that he had frequent headaches prior to his TAVR.  These have now resolved.  Prior CV studies:   The following studies were reviewed today:  Echo 11/21/17 Mild LVH, EF 20-25,  diffuse HK, grade 2 diastolic dysfunction, TAVR without significant regurgitation (mean 9), MAC, trivial MR, moderate LAE, mildly reduced RVSF, moderate RAE, trivial pericardial effusion  Carotid US 11/15/17 Bilateral ICA 1-39  Echo 10/08/17 Mild to moderate LVH, EF 15-20, diffuse HK, restrictive physiology, severe low flow low gradient aortic stenosis (mean 34), MAC, severe LAE, mild RAE  Cardiac catheterization 10/05/17 LAD proximal 50, 100 LCx proximal 100 RCA proximal 100 LIMA-LAD patent SVG-RI stent patent with 50 ISR SVG-RPDA patent SVG-OM2 patent  Past Medical History:  Diagnosis Date  . AICD (automatic cardioverter/defibrillator) present 05/18/2016  . Anginal pain ([place])    occ none recent  . Aortic stenosis   . Arthritis   . CAD (coronary artery disease)   . Cardiomyopathy, ischemic   . CHF (congestive heart failure) ([place])   . Chronic back pain   . Diabetes mellitus without complication ([place])   . Elevated PSA   . History of kidney stones   . Hyperlipidemia   . Morbid obesity ([place])   . Myocardial infarction ([place])   . OSA (obstructive sleep apnea)    does not wear CPAP, -order run out told need to redo  test  . 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   29 mm Edwards Sapien 3 transcatheter heart valve placed via percutaneous right transfemoral approach    Surgical Hx: The patient  has a  past surgical history that includes Appendectomy; ingrown toenail (N/A); Carotid stent; left heart catheterization with coronary/graft angiogram (N/A, 12/21/2014); Cataract extraction, bilateral; Cardiac catheterization (N/A, 05/18/2016); Coronary artery bypass graft (12/23/1999); Cardiac catheterization; Eye surgery; Tonsillectomy; Nasal septoplasty w/ turbinoplasty (Bilateral, 04/25/2017); Nasal septoplasty w/ turbinoplasty (Bilateral, 04/25/2017); RIGHT/LEFT HEART  CATH AND CORONARY/GRAFT ANGIOGRAPHY (N/A, 10/05/2017); Transcatheter aortic valve replacement, transfemoral (N/A, 11/20/2017); and TEE without cardioversion (N/A, 11/20/2017).   Current Medications: Current Meds  Medication Sig  . aspirin EC 81 MG EC tablet Take 1 tablet (81 mg total) by mouth daily.  Marland Kitchen aspirin-acetaminophen-caffeine (EXCEDRIN MIGRAINE) 250-250-65 MG tablet Take 2 tablets by mouth daily as needed for headache.  Marland Kitchen aspirin-sod bicarb-citric acid (ALKA-SELTZER) 325 MG TBEF tablet Take 650 mg by mouth daily as needed (indigestion).  . clopidogrel (PLAVIX) 75 MG tablet Take 1 tablet (75 mg total) by mouth daily.  Marland Kitchen HYDROcodone-acetaminophen (NORCO) 10-325 MG tablet Take 2 tablets by mouth daily as needed for severe pain.  Marland Kitchen liver oil-zinc oxide (DESITIN) 40 % ointment Apply 1 application topically as needed for irritation.  . metolazone (ZAROXOLYN) 2.5 MG tablet TAKE ONE TABLET BY MOUTH DAILY.  . Multiple Vitamins-Minerals (CENTRUM SILVER PO) Take 2 tablets by mouth daily.   . nitroGLYCERIN (NITROSTAT) 0.4 MG SL tablet PLACE 1 TABLET UNDER TONGUE FOR CHEST PAIN. MAY REPEAT EVERY 5 MIN UPTO 3 DOSES-NO RELIEF,CALL 911.  . polycarbophil (FIBERCON) 625 MG tablet Take 2 tablets (1,250 mg total) by mouth daily with lunch. Hold if you have diarrhea  . potassium chloride SA (K-DUR,KLOR-CON) 20 MEQ tablet 2 tablets in the am, one tablet midday and 2 tablets qpm  . rosuvastatin (CRESTOR) 40 MG tablet Take 1 tablet (40 mg total) by mouth daily.  . SURE COMFORT PEN NEEDLES 31G X 8 MM MISC USING 4 TIMES DAILY.  Marland Kitchen torsemide (DEMADEX) 20 MG tablet TAKE (2) TABLETS BY MOUTH TWICE DAILY.  Marland Kitchen trolamine salicylate (ASPERCREME) 10 % cream Apply 1 application topically 2 (two) times daily as needed for muscle pain.   . [DISCONTINUED] Insulin Detemir (LEVEMIR) 100 UNIT/ML Pen Inject 12 units into skin each evening. May titrate up to 60 units.  . [DISCONTINUED] NOVOLOG FLEXPEN 100 UNIT/ML FlexPen INJECT 6 UNITS  INTO THE SKIN WITH EACH MEAL. MAY TITRATE UP 14 UNITS.     Allergies:   Dyflex-g [dyphylline-guaifenesin]   Social History   Tobacco Use  . Smoking status: Former Smoker    Last attempt to quit: 08/29/1999    Years since quitting: 18.2  . Smokeless tobacco: Never Used  . Tobacco comment: couldn't remember date  Substance Use Topics  . Alcohol use: No    Alcohol/week: 0.0 oz  . Drug use: No     Family Hx: The patient's family history includes CAD in his mother; Diabetes in his mother. He was adopted.  ROS:   Please see the history of present illness.    ROS All other systems reviewed and are negative.   EKGs/Labs/Other Test Reviewed:    EKG:  EKG is  ordered today.  The ekg ordered today demonstrates normal sinus rhythm, heart rate 81, left bundle branch block  Recent Labs: 11/15/2017: ALT 42; B Natriuretic Peptide 341.2 11/21/2017: BUN 11; Creatinine, Ser 1.01; Hemoglobin 12.9; Magnesium 1.9; Platelets 200; Potassium 3.7; Sodium 138   Recent Lipid Panel Lab Results  Component Value Date/Time   CHOL 116 06/25/2017 11:40 AM   TRIG 143 06/25/2017 11:40 AM   HDL 35 (L) 06/25/2017 11:40 AM  CHOLHDL 3.3 06/25/2017 11:40 AM   CHOLHDL 3.9 01/02/2015 10:10 AM   LDLCALC 52 06/25/2017 11:40 AM    Physical Exam:    VS:  BP (!) 158/78   Pulse 82   Ht _0  (1.778 m)   Wt 287 lb (130.2 kg)   SpO2 92%   BMI 41.18 kg/m     Wt Readings from Last 3 Encounters:  11/28/17 287 lb (130.2 kg)  11/21/17 283 lb 4.7 oz (128.5 kg)  11/15/17 289 lb 6.4 oz (131.3 kg)     Physical Exam  Constitutional: He is oriented to person, place, and time. He appears well-developed and well-nourished. No distress.  HENT:  Head: Normocephalic and atraumatic.  Neck: No JVD present.  Cardiovascular: Normal rate and regular rhythm.  No murmur heard. Pulmonary/Chest: Effort normal. He has no rales.  Abdominal: Soft.  Musculoskeletal: He exhibits no edema.  Right groin without bruit; right  femoral arteriotomy site is opened but without erythema or discharge  Neurological: He is alert and oriented to person, place, and time.  Skin: Skin is warm and dry.    ASSESSMENT & PLAN:    #1.  S/P TAVR (transcatheter aortic valve replacement) He is progressing well after recent TAVR.  His postop echo demonstrated no perivalvular leak.  He does have follow-up May 10 with Dr. Burt Knack with a follow-up echo.  He will keep that appointment.  We discussed the importance of SBE prophylaxis.  Continue aspirin, Plavix.  Repeat BMET, CBC today.  #2.  Coronary artery disease involving native coronary artery of native heart with angina pectoris History of prior inferior myocardial infarction and subsequent CABG with PCI of the SVG-RI.  Cardiac catheterization prior to TAVR demonstrated stable anatomy.  He does have chronic angina and had a recent episode since his TAVR.  His anginal pattern is overall stable.  Continue aspirin, Plavix, statin.  He is currently off of beta-blocker secondary to new onset left bundle branch block post TAVR.  I will discuss further with Dr. Burt Knack before resuming this.  #3.  Chronic combined systolic and diastolic CHF (congestive heart failure) (HCC) Volume remains stable on current regimen.  His beta-blocker was held due to new LBBB after TAVR.  I do not think it would be contraindicated to resume his Coreg now.  But, would prefer to review it with Dr. Burt Knack first.  I discussed this with the patient.  We will contact him once I can discuss with Dr. Burt Knack.  #4.  Essential hypertension Blood pressure elevated.  As noted, I will likely resume beta-blocker after discussion with Dr. Burt Knack.  #5.  CKD (chronic kidney disease) stage 3, GFR 30-59 ml/min (HCC) Obtain repeat BMET, CBC today.  #6.  LBBB (left bundle branch block) New onset after recent TAVR.   Dispo:  Return in about 5 weeks (around 01/04/2018) for Scheduled Follow Up, w/ Dr. Burt Knack.   Medication  Adjustments/Labs and Tests Ordered: Current medicines are reviewed at length with the patient today.  Concerns regarding medicines are outlined above.  Tests Ordered: Orders Placed This Encounter  Procedures  . Basic Metabolic Panel (BMET)  . CBC  . EKG 12-Lead   Medication Changes: No orders of the defined types were placed in this encounter.   Signed, Richardson Dopp, PA-C  11/28/2017 1:45 PM    [facility] [address], [place], [place]  [address] Phone: [phone]; Fax: [phone]

## 2017-11-28 NOTE — Patient Instructions (Signed)
Medication Instructions:  1. Your physician recommends that you continue on your current medications as directed. Please refer to the Current Medication list given to you today.   Labwork: TODAY BMET, CBC   Testing/Procedures: NONE ORDERED TODAY  Follow-Up: KEEP YOUR APPT WITH DR. Burt Knack ON MAY 05/2018  Any Other Special Instructions Will Be Listed Below (If Applicable). WE WILL CALL YOU ABOUT THE COREG ONCE PA HAS FURTHER DISCUSSED WITH DR. Burt Knack     If you need a refill on your cardiac medications before your next appointment, please call your pharmacy.

## 2017-11-28 NOTE — Telephone Encounter (Signed)
Pt called back. I then called pt back and went over recommendation to resume Coreg 6.25 mg BID. Pt did ask for  New Rx to be sent in. Said he has some at home though would like a new refill as well. Rx has been sent in. Pt thanked me for my call.

## 2017-11-28 NOTE — Telephone Encounter (Signed)
Per Richardson Dopp, PAC and Dr. Burt Knack ok to resume Coreg 6.25 mg BID. Lmptcb to go over recommendations.

## 2017-11-28 NOTE — Telephone Encounter (Signed)
Lab results d/w DOD Dr. Radford Pax. Recommendation per DOD is to increase K+ to 20 meq TID. Pt is currently on K+ 40 meq in AM, 20 meq midday, 40 meq PM. Per Dr. Radford Pax increase midday dose to 40 meq with bmet to be done Friday 11/30/17 per DOD. Pt has been notified and is agreeable to plan of care. Pt will have lab work done Friday at [facility] which is closer for him. Pt thanked me for the call.

## 2017-11-30 ENCOUNTER — Telehealth: Payer: Self-pay | Admitting: *Deleted

## 2017-11-30 ENCOUNTER — Other Ambulatory Visit (HOSPITAL_COMMUNITY)
Admission: RE | Admit: 2017-11-30 | Discharge: 2017-11-30 | Disposition: A | Discharge status: Home / Self Care | Payer: Medicare Other | Source: Ambulatory Visit | Attending: Physician Assistant | Admitting: Physician Assistant

## 2017-11-30 DIAGNOSIS — E876 Hypokalemia: Secondary | ICD-10-CM | POA: Diagnosis not present

## 2017-11-30 LAB — BASIC METABOLIC PANEL
ANION GAP: 16 — AB (ref 5–15)
BUN: 18 mg/dL (ref 6–20)
CO2: 35 mmol/L — ABNORMAL HIGH (ref 22–32)
CREATININE: 1.08 mg/dL (ref 0.61–1.24)
Calcium: 9.4 mg/dL (ref 8.9–10.3)
Chloride: 91 mmol/L — ABNORMAL LOW (ref 101–111)
GLUCOSE: 188 mg/dL — AB (ref 65–99)
Potassium: 3.1 mmol/L — ABNORMAL LOW (ref 3.5–5.1)
Sodium: 142 mmol/L (ref 135–145)

## 2017-11-30 MED ORDER — SPIRONOLACTONE 25 MG PO TABS: 25.0000 mg | ORAL_TABLET | Freq: Every day | ORAL | 3 refills | Status: DC

## 2017-11-30 NOTE — Telephone Encounter (Signed)
Pt has been notified of lab results. Pt state has never taken Spironolactone to the best of his knowledge. Per recommendations from Richardson Dopp, [place] pt advised to to start Spironolactone 25 mg daily, with BMET to be done on 4/10 and 4/17. Pt aware to f/u with PCP in regards to his DM. Rx for Spironolactone has been sent to [facility]. Pt thanked me for my call. I will forward results to PCP Dr. Sallee Lange.

## 2017-11-30 NOTE — Telephone Encounter (Signed)
-----   Message from Liliane Shi, [place] sent at 11/30/2017 12:01 PM EDT ----- Renal function normal.  The potassium is low. The sugar is high. PLAN:  1. Ask patient if he has ever taken Spironolactone 2. If no, start him on Spironolactone 25 mg Once daily and repeat BMET Wed 12/05/17 and Wed 12/12/17 3. If yes, do not start Spironolactone, but increase K+ to 60 mEq Three times a day and repeat BMET Wed 12/05/17 4. Follow up with primary care regarding diabetes Richardson Dopp, PA-C    11/30/2017 11:54 AM

## 2017-12-03 ENCOUNTER — Encounter: Payer: Self-pay | Admitting: Thoracic Surgery (Cardiothoracic Vascular Surgery)

## 2017-12-04 ENCOUNTER — Telehealth: Payer: Self-pay | Admitting: Family Medicine

## 2017-12-04 ENCOUNTER — Telehealth: Payer: Self-pay | Admitting: Physician Assistant

## 2017-12-04 NOTE — Telephone Encounter (Signed)
[name] [name] is asking that you give him a call . Thanks

## 2017-12-04 NOTE — Telephone Encounter (Signed)
Pt thinks he may have kidney stones and is having to go to the hospital tomorrow for tests. Pt is wanting to know if an ultrasound can be set up for tomorrow as well. Please advise.

## 2017-12-04 NOTE — Telephone Encounter (Signed)
Lipids optimal in Oct 2018. The hemoglobin was normal 11/28/17. The BMET that is being done tomorrow will evaluate Calcium as well.  If elevated, he will need follow up with primary care or urology. Richardson Dopp, PA-C    12/04/2017 4:54 PM

## 2017-12-04 NOTE — Telephone Encounter (Signed)
Pt calling about kidney stones and would like additional lab work to be ordered if possible with [place] lab work. Pt is scheduled for BMET to be done tomorrow due to new start of Spironolactone. Pt states to me that he believes he has kidney stones and may have passed 1-2 of them already though feels that he may still have 1 left. Pt asked if PA would order all he can in lab work to check everything, including cholesterol, liver function, cbc. I advised pt I will d/w PA for further recommendations into additional lab work for tomorrow. Last lipid and liver were done 05/2017. I did advise pt he really needs to call PCP and let them know about the kidney stones as that they may want to do an ultrasound. Pt is agreeable to plan of care.

## 2017-12-05 ENCOUNTER — Telehealth: Payer: Self-pay | Admitting: *Deleted

## 2017-12-05 ENCOUNTER — Other Ambulatory Visit (HOSPITAL_COMMUNITY)
Admission: RE | Admit: 2017-12-05 | Discharge: 2017-12-05 | Disposition: A | Discharge status: Home / Self Care | Payer: Medicare Other | Source: Ambulatory Visit | Attending: Physician Assistant | Admitting: Physician Assistant

## 2017-12-05 ENCOUNTER — Other Ambulatory Visit: Payer: Medicare Other

## 2017-12-05 DIAGNOSIS — I5042 Chronic combined systolic (congestive) and diastolic (congestive) heart failure: Secondary | ICD-10-CM

## 2017-12-05 DIAGNOSIS — E876 Hypokalemia: Secondary | ICD-10-CM | POA: Insufficient documentation

## 2017-12-05 LAB — BASIC METABOLIC PANEL
Anion gap: 15 (ref 5–15)
BUN: 31 mg/dL — AB (ref 6–20)
CO2: 32 mmol/L (ref 22–32)
CREATININE: 1.24 mg/dL (ref 0.61–1.24)
Calcium: 9.3 mg/dL (ref 8.9–10.3)
Chloride: 91 mmol/L — ABNORMAL LOW (ref 101–111)
GFR calc Af Amer: >60 mL/min (ref >60)
GFR, EST NON AFRICAN AMERICAN: 58 mL/min — AB (ref >60)
GLUCOSE: 142 mg/dL — AB (ref 65–99)
POTASSIUM: 3.7 mmol/L (ref 3.5–5.1)
SODIUM: 138 mmol/L (ref 135–145)

## 2017-12-05 MED ORDER — TORSEMIDE 20 MG PO TABS: 20.0000 mg | ORAL_TABLET | ORAL | 3 refills | Status: DC

## 2017-12-05 NOTE — Telephone Encounter (Signed)
Pt has been notified of lab results as well as dose change in torsemide. Pt agreeable to decrease Torsemide to 40 mg in the AM and 20 mg in the PM. Pt will have repeat bmet in 1 week at [facility]. Pt will monitor weight and is aware if his weight is up 3 lb's x 1 day or increased sob he can take an extra Torsemide 20 mg. Pt thanked me for the call with verbal understanding to plan of care.

## 2017-12-05 NOTE — Telephone Encounter (Signed)
1.  Ultrasounds cannot be set up this quickly for these type of issues #2 in order to take good care of the patient I highly recommend an office visit at his convenience.  We can discuss his symptoms check his urine and set up any appropriate test

## 2017-12-05 NOTE — Telephone Encounter (Signed)
Pts wife called. Informed that husband was not with her at this time but she would get in touch with him and she would call back to set up appt.

## 2017-12-05 NOTE — Telephone Encounter (Signed)
-----   Message from Liliane Shi, [place] sent at 12/05/2017  1:40 PM EDT ----- Creatinine and potassium normal. The BUN is somewhat elevated. PLAN:  1. Decrease Torsemide to 40 mg in A and 20 mg in P 2. Repeat BMET next week (already scheduled) 3. Take extra Torsemide 20 if weight increases 3 lbs in 1 day or increase swelling.  Richardson Dopp, PA-C    12/05/2017 1:38 PM

## 2017-12-07 ENCOUNTER — Emergency Department (HOSPITAL_COMMUNITY)
Admission: EM | Admit: 2017-12-07 | Discharge: 2017-12-07 | Disposition: A | Discharge status: Home / Self Care | Payer: Medicare Other | Attending: Emergency Medicine | Admitting: Emergency Medicine

## 2017-12-07 ENCOUNTER — Other Ambulatory Visit: Payer: Self-pay

## 2017-12-07 ENCOUNTER — Encounter (HOSPITAL_COMMUNITY): Payer: Self-pay | Admitting: Emergency Medicine

## 2017-12-07 DIAGNOSIS — Z7982 Long term (current) use of aspirin: Secondary | ICD-10-CM | POA: Diagnosis not present

## 2017-12-07 DIAGNOSIS — S6982XA Other specified injuries of left wrist, hand and finger(s), initial encounter: Secondary | ICD-10-CM | POA: Diagnosis present

## 2017-12-07 DIAGNOSIS — Z23 Encounter for immunization: Secondary | ICD-10-CM | POA: Diagnosis not present

## 2017-12-07 DIAGNOSIS — Y9389 Activity, other specified: Secondary | ICD-10-CM | POA: Diagnosis not present

## 2017-12-07 DIAGNOSIS — Z7902 Long term (current) use of antithrombotics/antiplatelets: Secondary | ICD-10-CM | POA: Insufficient documentation

## 2017-12-07 DIAGNOSIS — I2510 Atherosclerotic heart disease of native coronary artery without angina pectoris: Secondary | ICD-10-CM | POA: Diagnosis not present

## 2017-12-07 DIAGNOSIS — N183 Chronic kidney disease, stage 3 (moderate): Secondary | ICD-10-CM | POA: Insufficient documentation

## 2017-12-07 DIAGNOSIS — Z87891 Personal history of nicotine dependence: Secondary | ICD-10-CM | POA: Diagnosis not present

## 2017-12-07 DIAGNOSIS — Y999 Unspecified external cause status: Secondary | ICD-10-CM | POA: Insufficient documentation

## 2017-12-07 DIAGNOSIS — I130 Hypertensive heart and chronic kidney disease with heart failure and stage 1 through stage 4 chronic kidney disease, or unspecified chronic kidney disease: Secondary | ICD-10-CM | POA: Insufficient documentation

## 2017-12-07 DIAGNOSIS — E1122 Type 2 diabetes mellitus with diabetic chronic kidney disease: Secondary | ICD-10-CM | POA: Diagnosis not present

## 2017-12-07 DIAGNOSIS — W268XXA Contact with other sharp object(s), not elsewhere classified, initial encounter: Secondary | ICD-10-CM | POA: Insufficient documentation

## 2017-12-07 DIAGNOSIS — I252 Old myocardial infarction: Secondary | ICD-10-CM | POA: Insufficient documentation

## 2017-12-07 DIAGNOSIS — Y929 Unspecified place or not applicable: Secondary | ICD-10-CM | POA: Insufficient documentation

## 2017-12-07 DIAGNOSIS — Z794 Long term (current) use of insulin: Secondary | ICD-10-CM | POA: Insufficient documentation

## 2017-12-07 DIAGNOSIS — Z79899 Other long term (current) drug therapy: Secondary | ICD-10-CM | POA: Insufficient documentation

## 2017-12-07 DIAGNOSIS — Z951 Presence of aortocoronary bypass graft: Secondary | ICD-10-CM | POA: Diagnosis not present

## 2017-12-07 DIAGNOSIS — I5042 Chronic combined systolic (congestive) and diastolic (congestive) heart failure: Secondary | ICD-10-CM | POA: Insufficient documentation

## 2017-12-07 DIAGNOSIS — S61412A Laceration without foreign body of left hand, initial encounter: Secondary | ICD-10-CM | POA: Diagnosis not present

## 2017-12-07 MED ORDER — TETANUS-DIPHTH-ACELL PERTUSSIS 5-2.5-18.5 LF-MCG/0.5 IM SUSP
0.5000 mL | Freq: Once | INTRAMUSCULAR | Status: AC
  Administered 2017-12-07: 0.5 mL via INTRAMUSCULAR
  Filled 2017-12-07: qty 0.5

## 2017-12-07 MED ORDER — CEPHALEXIN 500 MG PO CAPS
500.0000 mg | ORAL_CAPSULE | Freq: Three times a day (TID) | ORAL | 0 refills | Status: DC
Start: 2017-12-07 — End: 2018-03-11

## 2017-12-07 MED ORDER — CEPHALEXIN 500 MG PO CAPS
500.0000 mg | ORAL_CAPSULE | Freq: Once | ORAL | Status: AC
  Administered 2017-12-07: 500 mg via ORAL
  Filled 2017-12-07: qty 1

## 2017-12-07 MED ORDER — BACITRACIN-NEOMYCIN-POLYMYXIN 400-5-5000 EX OINT
TOPICAL_OINTMENT | Freq: Once | CUTANEOUS | Status: AC
  Administered 2017-12-07: 18:00:00 via TOPICAL
  Filled 2017-12-07: qty 1

## 2017-12-07 NOTE — ED Provider Notes (Signed)
[facility] EMERGENCY DEPARTMENT Provider Note   CSN: [id number] Arrival date & time: 12/07/17  1657     History   Chief Complaint Chief Complaint  Patient presents with  . Laceration    HPI [name] [name] is a 67 y.o. male who presents to the ED with a laceration. Patient reports he cut his left hand with a cover of a light switch yesterday. She had the bleeding controlled but every time he hits it against something it opens back up and starts bleeding again. Patient reports he is not up to date on tetanus.   The history is provided by the patient. No language interpreter was used.  Laceration   The incident occurred yesterday. The laceration is located on the left hand. The laceration is 2 cm in size. The pain is mild. He reports no foreign bodies present. His tetanus status is out of date.    Past Medical History:  Diagnosis Date  . AICD (automatic cardioverter/defibrillator) present 05/18/2016  . Anginal pain ([place])    occ none recent  . Aortic stenosis   . Arthritis   . CAD (coronary artery disease)   . Cardiomyopathy, ischemic   . CHF (congestive heart failure) ([place])   . Chronic back pain   . Diabetes mellitus without complication ([place])   . Elevated PSA   . History of kidney stones   . Hyperlipidemia   . Morbid obesity ([place])   . Myocardial infarction ([place])   . OSA (obstructive sleep apnea)    does not wear CPAP, -order run out told need to redo  test  . 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 Aortic stenosis   . S/P nasal septoplasty 04/25/2017  . AICD (automatic cardioverter/defibrillator) present 11/22/2016    . CKD (chronic kidney disease) stage 3, GFR 30-59 ml/min (HCC)   . Acute kidney injury ([place])   . Difficulty in walking, not elsewhere classified   . Pain in the abdomen   . Anasarca   . Constipation   . Dyspnea 09/11/2016  . Prolonged Q-T interval on ECG 09/11/2016  . Hypokalemia 09/11/2016  . Chest pain 10/22/2015  . Diabetes mellitus, type 2 ([place]) 10/22/2015  . Essential hypertension   . Chronic lumbar pain 01/01/2015  . Obstructive sleep apnea 10/02/2014  . Cardiomyopathy, ischemic 06/25/2014  . Chronic combined systolic and diastolic CHF (congestive heart failure) ([place]) 10/29/2013  . Right ankle pain 08/14/2013  . Hyperlipemia 05/14/2013  . CAD (coronary artery disease) 05/14/2013  . Morbid obesity ([place]) 05/14/2013  . Elevated PSA 05/14/2013  . 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 APPENDECTOMY    . CARDIAC CATHETERIZATION     stents  . CAROTID STENT     denies carotid stent  . CATARACT EXTRACTION, BILATERAL    . CORONARY ARTERY BYPASS GRAFT  12/23/1999   LIMA to LAD, SVG to ramus, sequential SVG to OM2-RPL, sequential SVG to AM-PDA  . EP IMPLANTABLE DEVICE N/A 05/18/2016   Procedure: ICD Implant;  Surgeon: Will Meredith Leeds, MD;  Location: [facility] CV LAB;  Service:  Cardiovascular;  Laterality: N/A;  . EYE SURGERY    . ingrown toenail N/A   . LEFT HEART CATHETERIZATION WITH CORONARY/GRAFT ANGIOGRAM N/A 12/21/2014   Procedure: LEFT HEART CATHETERIZATION WITH Beatrix Fetters;  Surgeon: Belva Crome, MD;  Location: [facility] CATH LAB;  Service: Cardiovascular;  Laterality: N/A;  . NASAL SEPTOPLASTY W/ TURBINOPLASTY Bilateral 04/25/2017   NASAL SEPTOPLASTY WITH TURBINATE REDUCTION/notes 04/25/2017  . NASAL SEPTOPLASTY W/ TURBINOPLASTY Bilateral 04/25/2017   Procedure: NASAL SEPTOPLASTY WITH TURBINATE REDUCTION;  Surgeon: Leta Baptist, MD;  Location: [facility];  Service: ENT;  Laterality: Bilateral;  . RIGHT/LEFT HEART CATH AND  CORONARY/GRAFT ANGIOGRAPHY N/A 10/05/2017   Procedure: RIGHT/LEFT HEART CATH AND CORONARY/GRAFT ANGIOGRAPHY;  Surgeon: Sherren Mocha, MD;  Location: [place] CV LAB;  Service: Cardiovascular;  Laterality: N/A;  . TEE WITHOUT CARDIOVERSION N/A 11/20/2017   Procedure: TRANSESOPHAGEAL ECHOCARDIOGRAM (TEE);  Surgeon: Sherren Mocha, MD;  Location: [place];  Service: Open Heart Surgery;  Laterality: N/A;  . TONSILLECTOMY    . TRANSCATHETER AORTIC VALVE REPLACEMENT, TRANSFEMORAL N/A 11/20/2017   Procedure: TRANSCATHETER AORTIC VALVE REPLACEMENT, TRANSFEMORAL;  Surgeon: Sherren Mocha, MD;  Location: [facility];  Service: Open Heart Surgery;  Laterality: N/A;        Home Medications    Prior to Admission medications   Medication Sig Start Date End Date Taking? Authorizing Provider  aspirin EC 81 MG EC tablet Take 1 tablet (81 mg total) by mouth daily. 11/22/17   Gold, Wilder Glade, PA-C  aspirin-acetaminophen-caffeine (EXCEDRIN MIGRAINE) [phone] MG tablet Take 2 tablets by mouth daily as needed for headache.    [provider]  aspirin-sod bicarb-citric acid (ALKA-SELTZER) 325 MG TBEF tablet Take 650 mg by mouth daily as needed (indigestion).    [provider]  carvedilol (COREG) 6.25 MG tablet Take 1 tablet (6.25 mg total) by mouth 2 (two) times daily. 11/28/17   Richardson Dopp T, PA-C  cephALEXin (KEFLEX) 500 MG capsule Take 1 capsule (500 mg total) by mouth 3 (three) times daily. 12/07/17   Ashley Murrain, NP  clopidogrel (PLAVIX) 75 MG tablet Take 1 tablet (75 mg total) by mouth daily. 06/25/17   Kathyrn Drown, MD  HYDROcodone-acetaminophen (NORCO) 10-325 MG tablet Take 2 tablets by mouth daily as needed for severe pain.    [provider]  LEVEMIR FLEXTOUCH 100 UNIT/ML Pen INJECT 12 UNITS INTO THE SKIN EACH EVENING. MAY TITRATE UP TO 60 UNITS. 11/28/17   Kathyrn Drown, MD  liver oil-zinc oxide (DESITIN) 40 % ointment Apply 1 application topically as needed for irritation.     [provider]  metolazone (ZAROXOLYN) 2.5 MG tablet TAKE ONE TABLET BY MOUTH DAILY. 11/12/17   Arnoldo Lenis, MD  Multiple Vitamins-Minerals (CENTRUM SILVER PO) Take 2 tablets by mouth daily.     [provider]  nitroGLYCERIN (NITROSTAT) 0.4 MG SL tablet PLACE 1 TABLET UNDER TONGUE FOR CHEST PAIN. MAY REPEAT EVERY 5 MIN UPTO 3 DOSES-NO RELIEF,CALL 911. 08/29/17   Arnoldo Lenis, MD  NOVOLOG FLEXPEN 100 UNIT/ML FlexPen INJECT 6 UNITS INTO THE SKIN WITH EACH MEAL. MAY TITRATE UP 14 UNITS. 11/28/17   Kathyrn Drown, MD  polycarbophil (FIBERCON) 625 MG tablet Take 2 tablets (1,250 mg total) by mouth daily with lunch. Hold if you have diarrhea 09/18/16   Rosita Fire, MD  potassium chloride SA (KLOR-CON M20) 20 MEQ tablet Take 2 tablets (40 mEq total) by mouth 3 (three) times daily. 11/28/17 02/26/18  Liliane Shi,  PA-C  rosuvastatin (CRESTOR) 40 MG tablet Take 1 tablet (40 mg total) by mouth daily. 06/25/17   Kathyrn Drown, MD  spironolactone (ALDACTONE) 25 MG tablet Take 1 tablet (25 mg total) by mouth daily. 11/30/17 02/28/18  Richardson Dopp T, PA-C  SURE COMFORT PEN NEEDLES 31G X 8 MM MISC USING 4 TIMES DAILY. 11/05/17   Kathyrn Drown, MD  torsemide (DEMADEX) 20 MG tablet Take 1 tablet (20 mg total) by mouth as directed. 40 mg in the AM and 20 mg in the PM 12/05/17 03/05/18  Richardson Dopp T, PA-C  trolamine salicylate (ASPERCREME) 10 % cream Apply 1 application topically 2 (two) times daily as needed for muscle pain.     [provider]    Family History Family History  Adopted: Yes  Problem Relation Age of Onset  . Diabetes Mother   . CAD Mother     Social History Social History   Tobacco Use  . Smoking status: Former Smoker    Last attempt to quit: 08/29/1999    Years since quitting: 18.2  . Smokeless tobacco: Never Used  . Tobacco comment: couldn't remember date  Substance Use Topics  . Alcohol use: No    Alcohol/week: 0.0 oz  . Drug use: No      Allergies   Dyflex-g [dyphylline-guaifenesin]   Review of Systems Review of Systems  Skin: Positive for wound.  All other systems reviewed and are negative.    Physical Exam Updated Vital Signs BP 139/85 (BP Location: Right Arm)   Pulse 85   Temp 98.6 F (37 C) (Oral)   Resp 20   Ht 5\' 10"  (1.778 m)   Wt 130.2 kg (287 lb)   SpO2 94%   BMI 41.18 kg/m   Physical Exam  Constitutional: He is oriented to person, place, and time. He appears well-developed and well-nourished. No distress.  HENT:  Head: Normocephalic and atraumatic.  Eyes: EOM are normal.  Neck: Neck supple.  Cardiovascular: Normal rate.  Pulmonary/Chest: Effort normal.  Musculoskeletal: Normal range of motion.       Left hand: He exhibits laceration. He exhibits normal range of motion and normal capillary refill. Normal sensation noted. Normal strength noted. He exhibits no thumb/finger opposition.       Hands: Neurological: He is alert and oriented to person, place, and time. No cranial nerve deficit.  Skin: Skin is warm and dry.  Psychiatric: He has a normal mood and affect.  Nursing note and vitals reviewed.    ED Treatments / Results  Labs (all labs ordered are listed, but only abnormal results are displayed) Labs Reviewed - No data to display Radiology No results found.  Procedures .Marland KitchenLaceration Repair Date/Time: 12/07/2017 6:07 PM Performed by: Ashley Murrain, NP Authorized by: Ashley Murrain, NP   Consent:    Consent obtained:  Verbal   Consent given by:  Patient   Risks discussed:  Infection and poor wound healing   Alternatives discussed:  Delayed treatment Anesthesia (see MAR for exact dosages):    Anesthesia method:  Local infiltration   Local anesthetic:  Lidocaine 1% w/o epi Laceration details:    Location:  Hand   Hand location:  L palm   Length (cm):  2 Repair type:    Repair type:  Simple Pre-procedure details:    Preparation:  Patient was prepped and draped in usual  sterile fashion Exploration:    Hemostasis achieved with:  Direct pressure   Wound exploration: entire depth  of wound probed and visualized     Wound extent: no foreign bodies/material noted     Contaminated: no   Treatment:    Area cleansed with:  Betadine   Irrigation solution:  Sterile saline   Irrigation method:  Syringe Skin repair:    Repair method:  Sutures   Suture size:  4-0   Suture material:  Prolene   Suture technique:  Simple interrupted   Number of sutures:  2 Approximation:    Approximation:  Loose Post-procedure details:    Dressing:  Antibiotic ointment and sterile dressing   Patient tolerance of procedure:  Tolerated well, no immediate complications Comments:     Tetanus updated   (including critical care time)  Medications Ordered in ED Medications  neomycin-bacitracin-polymyxin (NEOSPORIN) ointment (has no administration in time range)  cephALEXin (KEFLEX) capsule 500 mg (has no administration in time range)  Tdap (BOOSTRIX) injection 0.5 mL (0.5 mLs Intramuscular Given 12/07/17 1739)     Initial Impression / Assessment and Plan / ED Course  I have reviewed the triage vital signs and the nursing notes.  67 y.o. male with laceration to the left hand that occurred yesterday stable for d/c without signs of infection. Wound closed due to continued bleeding. Dr. Gilford Raid in to see the patient. Will start antibiotics. Return precautions discussed. Patient to f/u with PCP for suture removal in one week or return sooner for any signs of infection.  Final Clinical Impressions(s) / ED Diagnoses   Final diagnoses:  Complicated laceration of hand, left, initial encounter    ED Discharge Orders        Ordered    cephALEXin (KEFLEX) 500 MG capsule  3 times daily     12/07/17 1801       Janit Bern Ovando, NP 12/07/17 1811    Isla Pence, MD 12/07/17 1916

## 2017-12-07 NOTE — ED Notes (Signed)
Pt has 2cm laceration to left medial palm with bleeding controlled and approximated edges.  Cut on light switch yesterday at noon.

## 2017-12-07 NOTE — Discharge Instructions (Addendum)
Follow up with your doctor in 7 to 10 days for suture removal. Return here for any signs of infection.

## 2017-12-07 NOTE — ED Triage Notes (Signed)
Pt fell against a light switch and cut his inner left hand yesterday. Bleeding controlled at this time.

## 2017-12-08 ENCOUNTER — Other Ambulatory Visit: Payer: Self-pay | Admitting: Family Medicine

## 2017-12-12 ENCOUNTER — Other Ambulatory Visit: Payer: Medicare Other

## 2017-12-12 ENCOUNTER — Other Ambulatory Visit (HOSPITAL_COMMUNITY)
Admission: RE | Admit: 2017-12-12 | Discharge: 2017-12-12 | Disposition: A | Discharge status: Home / Self Care | Payer: Medicare Other | Source: Ambulatory Visit | Attending: Physician Assistant | Admitting: Physician Assistant

## 2017-12-12 DIAGNOSIS — E876 Hypokalemia: Secondary | ICD-10-CM | POA: Diagnosis not present

## 2017-12-12 LAB — BASIC METABOLIC PANEL
ANION GAP: 11 (ref 5–15)
BUN: 23 mg/dL — ABNORMAL HIGH (ref 6–20)
CHLORIDE: 97 mmol/L — AB (ref 101–111)
CO2: 27 mmol/L (ref 22–32)
Calcium: 8.6 mg/dL — ABNORMAL LOW (ref 8.9–10.3)
Creatinine, Ser: 1.16 mg/dL (ref 0.61–1.24)
GFR calc Af Amer: >60 mL/min (ref >60)
Glucose, Bld: 237 mg/dL — ABNORMAL HIGH (ref 65–99)
POTASSIUM: 4.6 mmol/L (ref 3.5–5.1)
Sodium: 135 mmol/L (ref 135–145)

## 2017-12-17 ENCOUNTER — Other Ambulatory Visit: Payer: Self-pay | Admitting: Family Medicine

## 2018-01-04 ENCOUNTER — Ambulatory Visit (HOSPITAL_COMMUNITY): Payer: Medicare Other | Attending: Cardiovascular Disease

## 2018-01-04 ENCOUNTER — Encounter: Payer: Self-pay | Admitting: Cardiovascular Disease

## 2018-01-04 ENCOUNTER — Ambulatory Visit (INDEPENDENT_AMBULATORY_CARE_PROVIDER_SITE_OTHER): Payer: Medicare Other | Admitting: Cardiovascular Disease

## 2018-01-04 VITALS — BP 118/76 | HR 86 | Ht 70.5 in | Wt 287.8 lb

## 2018-01-04 DIAGNOSIS — Z953 Presence of xenogenic heart valve: Secondary | ICD-10-CM | POA: Insufficient documentation

## 2018-01-04 DIAGNOSIS — I255 Ischemic cardiomyopathy: Secondary | ICD-10-CM | POA: Diagnosis not present

## 2018-01-04 DIAGNOSIS — E669 Obesity, unspecified: Secondary | ICD-10-CM | POA: Diagnosis not present

## 2018-01-04 DIAGNOSIS — I350 Nonrheumatic aortic (valve) stenosis: Secondary | ICD-10-CM | POA: Diagnosis not present

## 2018-01-04 DIAGNOSIS — Z952 Presence of prosthetic heart valve: Secondary | ICD-10-CM | POA: Diagnosis not present

## 2018-01-04 DIAGNOSIS — I517 Cardiomegaly: Secondary | ICD-10-CM | POA: Diagnosis not present

## 2018-01-04 DIAGNOSIS — Z6841 Body Mass Index (BMI) 40.0 and over, adult: Secondary | ICD-10-CM | POA: Diagnosis not present

## 2018-01-04 DIAGNOSIS — E876 Hypokalemia: Secondary | ICD-10-CM | POA: Diagnosis not present

## 2018-01-04 MED ORDER — AMOXICILLIN 500 MG PO TABS: 2000.0000 mg | ORAL_TABLET | ORAL | 11 refills | Status: DC

## 2018-01-04 MED ORDER — PERFLUTREN LIPID MICROSPHERE
1.0000 mL | INTRAVENOUS | Status: AC | PRN
  Administered 2018-01-04: 3 mL via INTRAVENOUS

## 2018-01-04 MED ORDER — NITROGLYCERIN 0.4 MG SL SUBL: SUBLINGUAL_TABLET | SUBLINGUAL | 6 refills | Status: DC

## 2018-01-04 NOTE — Progress Notes (Signed)
CARDIOLOGY OFFICE NOTE Date:  01/06/2018   ID:  [name], DOB 11-Aug-1951, MRN [id number]  PCP:  Kathyrn Drown, MD   Chief Complaint  Patient presents with  . Follow-up    30 Day Post-TAVR     HISTORY OF PRESENT ILLNESS: [name] [name] is a 67 y.o. male who presents today for 30 day TAVR follow-up. The patient was treated with a 29 mm Sapien 3 valve via a percutaneous transfemoral approach on 11/20/2017.  Postoperative day #1 echo study demonstrated severely reduced LV systolic function with LVEF 20 to 25% with diffuse hypokinesis and normal function of the bioprosthetic aortic valve with a mean gradient of 9 mmHg and no significant paravalvular regurgitation.  The patient is here with his wife today.  He is doing quite well.  He had one episode of chest discomfort that occurred when he was lifting a pantry door.  He took one nitroglycerin and symptoms resolved.  He has had no other problems.  He denies shortness of breath, edema, orthopnea, or PND.  Past Medical History:  Diagnosis Date  . AICD (automatic cardioverter/defibrillator) present 05/18/2016  . Anginal pain ([place])    occ none recent  . Aortic stenosis   . Arthritis   . CAD (coronary artery disease)   . Cardiomyopathy, ischemic   . CHF (congestive heart failure) ([place])   . Chronic back pain   . Diabetes mellitus without complication ([place])   . Elevated PSA   . History of kidney stones   . Hyperlipidemia   . Morbid obesity ([place])   . Myocardial infarction ([place])   . OSA (obstructive sleep apnea)    does not wear CPAP, -order run out told need to redo  test  . 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 aspirin EC 81  MG EC tablet Take 1 tablet (81 mg total) by mouth daily.    Marland Kitchen aspirin-acetaminophen-caffeine (EXCEDRIN MIGRAINE) 250-250-65 MG tablet Take 2 tablets by mouth daily as needed for headache.    Marland Kitchen aspirin-sod bicarb-citric acid (ALKA-SELTZER) 325 MG TBEF tablet Take 650 mg by mouth daily as needed (indigestion).    . carvedilol (COREG) 6.25 MG tablet Take 1 tablet (6.25 mg total) by mouth 2 (two) times daily. 180 tablet 3  . cephALEXin (KEFLEX) 500 MG capsule Take 1 capsule (500 mg total) by mouth 3 (three) times daily. 20 capsule 0  . clopidogrel (PLAVIX) 75 MG tablet Take 1 tablet (75 mg total) by mouth daily. 90 tablet 3  . HYDROcodone-acetaminophen (NORCO) 10-325 MG tablet Take 2 tablets by mouth daily as needed for severe pain.    Marland Kitchen LEVEMIR FLEXTOUCH 100 UNIT/ML Pen INJECT 12 UNITS INTO THE SKIN EACH EVENING. MAY TITRATE UP TO 60 UNITS. 15 mL 0  . liver oil-zinc oxide (DESITIN) 40 % ointment Apply 1 application topically as needed for irritation.    . metolazone (ZAROXOLYN) 2.5 MG tablet TAKE ONE TABLET BY MOUTH DAILY. 30 tablet 6  . Multiple Vitamins-Minerals (CENTRUM SILVER PO) Take 2 tablets by mouth daily.     . nitroGLYCERIN (NITROSTAT) 0.4 MG SL tablet PLACE 1 TABLET UNDER TONGUE FOR CHEST PAIN. MAY REPEAT EVERY 5 MIN UPTO  3 DOSES-NO RELIEF,CALL 911. 25 tablet 6  . NOVOLOG FLEXPEN 100 UNIT/ML FlexPen INJECT 6 UNITS INTO THE SKIN WITH EACH MEAL. MAY TITRATE UP 14 UNITS. 15 mL 0  . polycarbophil (FIBERCON) 625 MG tablet Take 2 tablets (1,250 mg total) by mouth daily with lunch. Hold if you have diarrhea 30 tablet 0  . potassium chloride SA (KLOR-CON M20) 20 MEQ tablet Take 2 tablets (40 mEq total) by mouth 3 (three) times daily. 540 tablet 0  . rosuvastatin (CRESTOR) 40 MG tablet Take 1 tablet (40 mg total) by mouth daily. 90 tablet 2  . spironolactone (ALDACTONE) 25 MG tablet Take 1 tablet (25 mg total) by mouth daily. 90 tablet 3  . SURE COMFORT PEN NEEDLES 31G X 8 MM MISC USING 4 TIMES  DAILY. 100 each 5  . torsemide (DEMADEX) 20 MG tablet Take 1 tablet (20 mg total) by mouth as directed. 40 mg in the AM and 20 mg in the PM 180 tablet 3  . trolamine salicylate (ASPERCREME) 10 % cream Apply 1 application topically 2 (two) times daily as needed for muscle pain.     Marland Kitchen amoxicillin (AMOXIL) 500 MG tablet Take 4 tablets (2,000 mg total) by mouth as directed. 4 tablet 11   No current facility-administered medications for this visit.     ALLERGIES:   Dyflex-g [dyphylline-guaifenesin]   SOCIAL HISTORY:  The patient  reports that he quit smoking about 18 years ago. He has never used smokeless tobacco. He reports that he does not drink alcohol or use drugs.   FAMILY HISTORY:  The patient's family history includes CAD in his mother; Diabetes in his mother. He was adopted.   REVIEW OF SYSTEMS:  Positive for back pain, muscle pain, headache.   All other systems are reviewed and negative.   PHYSICAL EXAM: VS:  BP 118/76   Pulse 86   Ht 5' 10.5" (1.791 m)   Wt 287 lb 12.8 oz (130.5 kg)   BMI 40.71 kg/m  , BMI Body mass index is 40.71 kg/m. GEN: Well nourished, well developed, morbidly obese male in no acute distress HEENT: normal Neck: No JVD.  Cardiac: The heart is RRR without murmurs, rubs, or gallops. No edema. Respiratory:  Few rhonchi bilaterally GI: soft, nontender, nondistended, + BS MS: no deformity or atrophy Skin: warm and dry, no rash Neuro:  Strength and sensation are intact Psych: euthymic mood, full affect  EKG:  EKG is not ordered today.  RECENT LABS: 11/15/2017: ALT 42; B Natriuretic Peptide 341.2 11/21/2017: Magnesium 1.9 11/28/2017: Hemoglobin 15.1; Platelets 221 12/12/2017: BUN 23; Creatinine, Ser 1.16; Potassium 4.6; Sodium 135  06/25/2017: Chol/HDL Ratio 3.3; Cholesterol, Total 116; HDL 35; LDL Calculated 52; Triglycerides 143   CrCl cannot be calculated (Patient's most recent lab result is older than the maximum 21 days allowed.).   Wt Readings from  Last 3 Encounters:  01/04/18 287 lb 12.8 oz (130.5 kg)  12/07/17 287 lb (130.2 kg)  11/28/17 287 lb (130.2 kg)     CARDIAC STUDIES:  Today's 2D echo images are personally reviewed.  Acoustic windows are very limited with poor image quality.  There is severe LV systolic dysfunction with a dilated LV.  There appears to be normal function of the patient's transcatheter heart valve with no paravalvular regurgitation.  ASSESSMENT AND PLAN: Aortic valve disease s/p TAVR. The patient has NYHA functional class 2 symptoms of chronic combined systolic and diastolic heart failure. Today's echo study is personally reviewed and demonstrates severe  LV systolic dysfunction (unchanged), normal function of the aortic valve prosthesis with a mean gradient of 4 mmHg, respectively. There is no paravalvular regurgitation. He continues to have very severe LV systolic dysfunction.   He is counseled regarding need for lifelong SBE prophylaxis when indicated.  Labs/ tests ordered today include:   Disposition:   FU as planned with Dr Lucendia Herrlich 01/06/2018 8:55 PM     [address] [place] [id number]  [phone] (office) [phone] (fax)

## 2018-01-04 NOTE — Patient Instructions (Signed)
Medication Instructions:  1) Your physician discussed the importance of taking an antibiotic prior to any dental, gastrointestinal, genitourinary procedures to prevent damage to the heart valves from infection. You were given a prescription for an antibiotic based on current SBE prophylaxis guidelines. You have been given a prescription for AMOXIL 2,000 mg to take one hour prior to dental procedures.  Labwork: None  Testing/Procedures: Your provider has requested that you have an echocardiogram in 1 year. Echocardiography is a painless test that uses sound waves to create images of your heart. It provides your doctor with information about the size and shape of your heart and how well your heart's chambers and valves are working. This procedure takes approximately one hour. There are no restrictions for this procedure.    Follow-Up: Your provider wants you to follow-up in: 1 year with the Structural Heart Team. You will receive a reminder letter in the mail two months in advance. If you don't receive a letter, please call our office to schedule the follow-up appointment.    Any Other Special Instructions Will Be Listed Below (If Applicable).     If you need a refill on your cardiac medications before your next appointment, please call your pharmacy.

## 2018-01-06 ENCOUNTER — Encounter: Payer: Self-pay | Admitting: Cardiovascular Disease

## 2018-01-15 ENCOUNTER — Other Ambulatory Visit: Payer: Self-pay | Admitting: Family Medicine

## 2018-01-17 MED FILL — Perflutren Lipid Microsphere IV Susp 6.52 MG/ML: INTRAVENOUS | Qty: 2 | Status: AC

## 2018-01-19 ENCOUNTER — Other Ambulatory Visit: Payer: Self-pay | Admitting: Family Medicine

## 2018-01-22 NOTE — Telephone Encounter (Signed)
May have this +1 refill needs follow-up office visit 

## 2018-01-25 ENCOUNTER — Other Ambulatory Visit: Payer: Self-pay | Admitting: Family Medicine

## 2018-01-26 NOTE — Telephone Encounter (Signed)
1 refill does need follow-up office visit

## 2018-01-30 ENCOUNTER — Encounter: Payer: Self-pay | Admitting: Thoracic Surgery (Cardiothoracic Vascular Surgery)

## 2018-02-25 ENCOUNTER — Other Ambulatory Visit: Payer: Self-pay | Admitting: Family Medicine

## 2018-02-25 ENCOUNTER — Ambulatory Visit (INDEPENDENT_AMBULATORY_CARE_PROVIDER_SITE_OTHER): Payer: Medicare Other | Admitting: *Deleted

## 2018-02-25 DIAGNOSIS — I255 Ischemic cardiomyopathy: Secondary | ICD-10-CM | POA: Diagnosis not present

## 2018-02-25 DIAGNOSIS — I5022 Chronic systolic (congestive) heart failure: Secondary | ICD-10-CM

## 2018-02-25 NOTE — Progress Notes (Signed)
Remote ICD transmission.   

## 2018-03-01 LAB — CUP PACEART REMOTE DEVICE CHECK
Battery Voltage: 3.01 V
Brady Statistic RV Percent Paced: 0.03 %
Date Time Interrogation Session: 20190701063324
HighPow Impedance: 95 Ohm
Implantable Lead Location: 753860
Lead Channel Impedance Value: 456 Ohm
Lead Channel Setting Pacing Amplitude: 2.5 V
Lead Channel Setting Sensing Sensitivity: 0.3 mV
MDC IDC LEAD IMPLANT DT: 20170921
MDC IDC MSMT BATTERY REMAINING LONGEVITY: 128 mo
MDC IDC MSMT LEADCHNL RV IMPEDANCE VALUE: 399 Ohm
MDC IDC MSMT LEADCHNL RV PACING THRESHOLD AMPLITUDE: 0.75 V
MDC IDC MSMT LEADCHNL RV PACING THRESHOLD PULSEWIDTH: 0.4 ms
MDC IDC MSMT LEADCHNL RV SENSING INTR AMPL: 6.625 mV
MDC IDC MSMT LEADCHNL RV SENSING INTR AMPL: 6.625 mV
MDC IDC PG IMPLANT DT: 20170921
MDC IDC SET LEADCHNL RV PACING PULSEWIDTH: 0.4 ms

## 2018-03-11 ENCOUNTER — Encounter: Payer: Self-pay | Admitting: Family Medicine

## 2018-03-11 ENCOUNTER — Ambulatory Visit (INDEPENDENT_AMBULATORY_CARE_PROVIDER_SITE_OTHER): Payer: Medicare Other | Admitting: Family Medicine

## 2018-03-11 VITALS — BP 132/80 | Ht 70.5 in | Wt 287.0 lb

## 2018-03-11 DIAGNOSIS — I10 Essential (primary) hypertension: Secondary | ICD-10-CM

## 2018-03-11 DIAGNOSIS — I5042 Chronic combined systolic (congestive) and diastolic (congestive) heart failure: Secondary | ICD-10-CM

## 2018-03-11 DIAGNOSIS — E782 Mixed hyperlipidemia: Secondary | ICD-10-CM | POA: Diagnosis not present

## 2018-03-11 DIAGNOSIS — E119 Type 2 diabetes mellitus without complications: Secondary | ICD-10-CM

## 2018-03-11 DIAGNOSIS — R972 Elevated prostate specific antigen [PSA]: Secondary | ICD-10-CM | POA: Diagnosis not present

## 2018-03-11 DIAGNOSIS — I255 Ischemic cardiomyopathy: Secondary | ICD-10-CM

## 2018-03-11 LAB — POCT GLYCOSYLATED HEMOGLOBIN (HGB A1C): Hemoglobin A1C: 8.4 % — AB (ref 4.0–5.6)

## 2018-03-11 MED ORDER — INSULIN DETEMIR 100 UNIT/ML FLEXPEN: PEN_INJECTOR | SUBCUTANEOUS | 5 refills | Status: DC

## 2018-03-11 MED ORDER — HYDROCODONE-ACETAMINOPHEN 10-325 MG PO TABS: ORAL_TABLET | ORAL | 0 refills | Status: DC

## 2018-03-11 MED ORDER — INSULIN ASPART 100 UNIT/ML FLEXPEN: PEN_INJECTOR | SUBCUTANEOUS | 5 refills | Status: DC

## 2018-03-11 NOTE — Patient Instructions (Signed)
Diabetes Mellitus and Nutrition When you have diabetes (diabetes mellitus), it is very important to have healthy eating habits because your blood sugar (glucose) levels are greatly affected by what you eat and drink. Eating healthy foods in the appropriate amounts, at about the same times every day, can help you:  Control your blood glucose.  Lower your risk of heart disease.  Improve your blood pressure.  Reach or maintain a healthy weight.  Every person with diabetes is different, and each person has different needs for a meal plan. Your health care provider may recommend that you work with a diet and nutrition specialist (dietitian) to make a meal plan that is best for you. Your meal plan may vary depending on factors such as:  The calories you need.  The medicines you take.  Your weight.  Your blood glucose, blood pressure, and cholesterol levels.  Your activity level.  Other health conditions you have, such as heart or kidney disease.  How do carbohydrates affect me? Carbohydrates affect your blood glucose level more than any other type of food. Eating carbohydrates naturally increases the amount of glucose in your blood. Carbohydrate counting is a method for keeping track of how many carbohydrates you eat. Counting carbohydrates is important to keep your blood glucose at a healthy level, especially if you use insulin or take certain oral diabetes medicines. It is important to know how many carbohydrates you can safely have in each meal. This is different for every person. Your dietitian can help you calculate how many carbohydrates you should have at each meal and for snack. Foods that contain carbohydrates include:  Bread, cereal, rice, pasta, and crackers.  Potatoes and corn.  Peas, beans, and lentils.  Milk and yogurt.  Fruit and juice.  Desserts, such as cakes, cookies, ice cream, and candy.  How does alcohol affect me? Alcohol can cause a sudden decrease in blood  glucose (hypoglycemia), especially if you use insulin or take certain oral diabetes medicines. Hypoglycemia can be a life-threatening condition. Symptoms of hypoglycemia (sleepiness, dizziness, and confusion) are similar to symptoms of having too much alcohol. If your health care provider says that alcohol is safe for you, follow these guidelines:  Limit alcohol intake to no more than 1 drink per day for nonpregnant women and 2 drinks per day for men. One drink equals 12 oz of beer, 5 oz of wine, or 1 oz of hard liquor.  Do not drink on an empty stomach.  Keep yourself hydrated with water, diet soda, or unsweetened iced tea.  Keep in mind that regular soda, juice, and other mixers may contain a lot of sugar and must be counted as carbohydrates.  What are tips for following this plan? Reading food labels  Start by checking the serving size on the label. The amount of calories, carbohydrates, fats, and other nutrients listed on the label are based on one serving of the food. Many foods contain more than one serving per package.  Check the total grams (g) of carbohydrates in one serving. You can calculate the number of servings of carbohydrates in one serving by dividing the total carbohydrates by 15. For example, if a food has 30 g of total carbohydrates, it would be equal to 2 servings of carbohydrates.  Check the number of grams (g) of saturated and trans fats in one serving. Choose foods that have low or no amount of these fats.  Check the number of milligrams (mg) of sodium in one serving. Most people   should limit total sodium intake to less than 2,300 mg per day.  Always check the nutrition information of foods labeled as "low-fat" or "nonfat". These foods may be higher in added sugar or refined carbohydrates and should be avoided.  Talk to your dietitian to identify your daily goals for nutrients listed on the label. Shopping  Avoid buying canned, premade, or processed foods. These  foods tend to be high in fat, sodium, and added sugar.  Shop around the outside edge of the grocery store. This includes fresh fruits and vegetables, bulk grains, fresh meats, and fresh dairy. Cooking  Use low-heat cooking methods, such as baking, instead of high-heat cooking methods like deep frying.  Cook using healthy oils, such as olive, canola, or sunflower oil.  Avoid cooking with butter, cream, or high-fat meats. Meal planning  Eat meals and snacks regularly, preferably at the same times every day. Avoid going long periods of time without eating.  Eat foods high in fiber, such as fresh fruits, vegetables, beans, and whole grains. Talk to your dietitian about how many servings of carbohydrates you can eat at each meal.  Eat 4-6 ounces of lean protein each day, such as lean meat, chicken, fish, eggs, or tofu. 1 ounce is equal to 1 ounce of meat, chicken, or fish, 1 egg, or 1/4 cup of tofu.  Eat some foods each day that contain healthy fats, such as avocado, nuts, seeds, and fish. Lifestyle   Check your blood glucose regularly.  Exercise at least 30 minutes 5 or more days each week, or as told by your health care provider.  Take medicines as told by your health care provider.  Do not use any products that contain nicotine or tobacco, such as cigarettes and e-cigarettes. If you need help quitting, ask your health care provider.  Work with a counselor or diabetes educator to identify strategies to manage stress and any emotional and social challenges. What are some questions to ask my health care provider?  Do I need to meet with a diabetes educator?  Do I need to meet with a dietitian?  What number can I call if I have questions?  When are the best times to check my blood glucose? Where to find more information:  American Diabetes Association: diabetes.org/food-and-fitness/food  Academy of Nutrition and Dietetics:  www.eatright.org/resources/health/diseases-and-conditions/diabetes  National Institute of Diabetes and Digestive and Kidney Diseases (NIH): www.niddk.nih.gov/health-information/diabetes/overview/diet-eating-physical-activity Summary  A healthy meal plan will help you control your blood glucose and maintain a healthy lifestyle.  Working with a diet and nutrition specialist (dietitian) can help you make a meal plan that is best for you.  Keep in mind that carbohydrates and alcohol have immediate effects on your blood glucose levels. It is important to count carbohydrates and to use alcohol carefully. This information is not intended to replace advice given to you by your health care provider. Make sure you discuss any questions you have with your health care provider. Document Released: 05/11/2005 Document Revised: 09/18/2016 Document Reviewed: 09/18/2016 Elsevier Interactive Patient Education  2018 [employer].  

## 2018-03-11 NOTE — Progress Notes (Signed)
Subjective:    Patient ID: [name] [name], male    DOB: 20-Oct-1950, 67 y.o.   MRN: [id number]  Diabetes  He presents for his follow-up diabetic visit. He has type 2 diabetes mellitus. Pertinent negatives for hypoglycemia include no confusion or headaches. Pertinent negatives for diabetes include no chest pain, no fatigue, no polydipsia, no polyphagia and no weakness. Current diabetic treatment includes insulin injections. Home blood sugar record trend: has not been checking blood sugar. Eye exam is not current.  Patient has a history of CHF under good control with medications not having any flareup currently  Has history of elevated PSA being followed by specialist but has not followed up recently is due for some testing denies any difficulty peeing denies any blood in urine  Patient has hyperlipidemia takes his medication does not watch his diet has been counseled about the importance of healthy diet  Poorly controlled diabetes he states it is because he runs out of insulin but he also admits that he typically eats in an subpar fashion in regards watching starches is does not have any interest in seeing a counselor currently  Patient for blood pressure check up.  The patient does have hypertension.  The patient is on medication.  Patient relates compliance with meds. Todays BP reviewed with the patient. Patient denies issues with medication. Patient relates reasonable diet. Patient tries to minimize salt. Patient aware of BP goals.   Results for orders placed or performed in visit on 03/11/18  POCT glycosylated hemoglobin (Hb A1C)  Result Value Ref Range   Hemoglobin A1C 8.4 (A) 4.0 - 5.6 %   HbA1c POC (<> result, manual entry)  4.0 - 5.6 %   HbA1c, POC (prediabetic range)  5.7 - 6.4 %   HbA1c, POC (controlled diabetic range)  0.0 - 7.0 %      Review of Systems  Constitutional: Negative for activity change, appetite change and fatigue.  HENT: Negative for congestion and rhinorrhea.    Respiratory: Positive for shortness of breath. Negative for cough and chest tightness.   Cardiovascular: Positive for leg swelling. Negative for chest pain.  Gastrointestinal: Negative for abdominal pain, diarrhea and nausea.  Endocrine: Negative for polydipsia and polyphagia.  Genitourinary: Negative for dysuria and hematuria.  Neurological: Negative for weakness and headaches.  Psychiatric/Behavioral: Negative for confusion and dysphoric mood.   He does get a little short of breath with activity he does have occasional swelling in his legs    Objective:   Physical Exam  Constitutional: He appears well-nourished. No distress.  HENT:  Head: Normocephalic and atraumatic.  Eyes: Right eye exhibits no discharge. Left eye exhibits no discharge.  Cardiovascular: Normal rate, regular rhythm and normal heart sounds.  No murmur heard. Pulmonary/Chest: Effort normal and breath sounds normal. No respiratory distress.  Musculoskeletal: He exhibits no edema or deformity.  Lymphadenopathy:    He has no cervical adenopathy.  Neurological: He is alert. No cranial nerve deficit.  Psychiatric: His behavior is normal.  Vitals reviewed.         Assessment & Plan:  HTN- Patient was seen today as part of a visit regarding hypertension. The importance of healthy diet and regular physical activity was discussed. The importance of compliance with medications discussed.  Ideal goal is to keep blood pressure low elevated levels certainly below 517/61 when possible.  The patient was counseled that keeping blood pressure under control lessen his risk of complications.  The importance of regular follow-ups was discussed with  the patient.  Low-salt diet such as DASH recommended.  Regular physical activity was recommended as well.  Patient was advised to keep regular follow-ups.  Poorly controlled diabetes I hope the patient understands the importance of dietary measures we adjusted the medications we  talked about possibly seeing the dietitian he does not want to do that  Heart failure stable under good control with current medications reinforced low-salt diet and the importance of taking medicines  25 minutes was spent with the patient.  This statement verifies that 25 minutes was indeed spent with the patient.  More than 50% of this visit-total duration of the visit-was spent in counseling and coordination of care. The issues that the patient came in for today as reflected in the diagnosis (s) please refer to documentation for further details. Elevated PSA lab work ordered May need referral back to urology

## 2018-03-27 ENCOUNTER — Other Ambulatory Visit: Payer: Self-pay

## 2018-04-12 ENCOUNTER — Telehealth: Payer: Self-pay | Admitting: Family Medicine

## 2018-04-12 NOTE — Telephone Encounter (Signed)
FYI

## 2018-04-12 NOTE — Telephone Encounter (Signed)
Patient just wanting you to let you know that he has got his weight down to 186

## 2018-05-14 ENCOUNTER — Other Ambulatory Visit: Payer: Self-pay | Admitting: Family Medicine

## 2018-05-15 ENCOUNTER — Other Ambulatory Visit: Payer: Self-pay | Admitting: Family Medicine

## 2018-05-15 MED ORDER — HYDROCODONE-ACETAMINOPHEN 10-325 MG PO TABS: ORAL_TABLET | ORAL | 0 refills | Status: DC

## 2018-05-15 NOTE — Telephone Encounter (Signed)
Tried calling pt and was disconnected. Tried calling back and forwarded to vm.

## 2018-05-15 NOTE — Telephone Encounter (Signed)
The prescription was sent electronically.  This should cover him for the next month when he needs his next please notify patient he will need to be seen before further prescriptions on this medicine

## 2018-05-20 ENCOUNTER — Other Ambulatory Visit: Payer: Self-pay | Admitting: Cardiology

## 2018-05-20 MED ORDER — POTASSIUM CHLORIDE CRYS ER 20 MEQ PO TBCR: 40.0000 meq | EXTENDED_RELEASE_TABLET | Freq: Three times a day (TID) | ORAL | 1 refills | Status: DC

## 2018-05-20 NOTE — Telephone Encounter (Signed)
Refilled potassium

## 2018-05-20 NOTE — Telephone Encounter (Signed)
Refill on Potassium to [facility] / tg

## 2018-05-21 NOTE — Telephone Encounter (Signed)
Contacted patient to make sure he has picked up medication. Pt stated he had picked it up. Patient wanted to let the doctor know that his blood sugar was 137 this morning. Pt also states that he appreciates the doctor.

## 2018-05-27 ENCOUNTER — Ambulatory Visit (INDEPENDENT_AMBULATORY_CARE_PROVIDER_SITE_OTHER): Payer: Medicare Other | Admitting: *Deleted

## 2018-05-27 DIAGNOSIS — I5022 Chronic systolic (congestive) heart failure: Secondary | ICD-10-CM

## 2018-05-27 DIAGNOSIS — I255 Ischemic cardiomyopathy: Secondary | ICD-10-CM

## 2018-05-27 NOTE — Progress Notes (Signed)
Remote ICD transmission.   

## 2018-05-29 LAB — CUP PACEART REMOTE DEVICE CHECK
Battery Remaining Longevity: 126 mo
Battery Voltage: 3.01 V
Brady Statistic RV Percent Paced: 0.01 %
HighPow Impedance: 94 Ohm
Implantable Lead Implant Date: 20170921
Implantable Lead Location: 753860
Implantable Pulse Generator Implant Date: 20170921
Lead Channel Impedance Value: 456 Ohm
Lead Channel Pacing Threshold Pulse Width: 0.4 ms
Lead Channel Setting Pacing Amplitude: 2.5 V
Lead Channel Setting Pacing Pulse Width: 0.4 ms
Lead Channel Setting Sensing Sensitivity: 0.3 mV
MDC IDC MSMT LEADCHNL RV IMPEDANCE VALUE: 399 Ohm
MDC IDC MSMT LEADCHNL RV PACING THRESHOLD AMPLITUDE: 0.875 V
MDC IDC MSMT LEADCHNL RV SENSING INTR AMPL: 9 mV
MDC IDC MSMT LEADCHNL RV SENSING INTR AMPL: 9 mV
MDC IDC SESS DTM: 20190930073524

## 2018-06-05 DIAGNOSIS — E119 Type 2 diabetes mellitus without complications: Secondary | ICD-10-CM | POA: Diagnosis not present

## 2018-06-05 DIAGNOSIS — R972 Elevated prostate specific antigen [PSA]: Secondary | ICD-10-CM | POA: Diagnosis not present

## 2018-06-05 DIAGNOSIS — I10 Essential (primary) hypertension: Secondary | ICD-10-CM | POA: Diagnosis not present

## 2018-06-05 DIAGNOSIS — E782 Mixed hyperlipidemia: Secondary | ICD-10-CM | POA: Diagnosis not present

## 2018-06-06 LAB — BASIC METABOLIC PANEL
BUN / CREAT RATIO: 22 (ref 10–24)
BUN: 30 mg/dL — ABNORMAL HIGH (ref 8–27)
CO2: 31 mmol/L — ABNORMAL HIGH (ref 20–29)
CREATININE: 1.35 mg/dL — AB (ref 0.76–1.27)
Calcium: 10.3 mg/dL — ABNORMAL HIGH (ref 8.6–10.2)
Chloride: 89 mmol/L — ABNORMAL LOW (ref 96–106)
GFR calc Af Amer: 62 mL/min/{1.73_m2} (ref >59)
GFR, EST NON AFRICAN AMERICAN: 54 mL/min/{1.73_m2} — AB (ref >59)
GLUCOSE: 228 mg/dL — AB (ref 65–99)
POTASSIUM: 3.4 mmol/L — AB (ref 3.5–5.2)
SODIUM: 137 mmol/L (ref 134–144)

## 2018-06-06 LAB — PSA, TOTAL AND FREE
PSA FREE PCT: 17.8 %
PSA, Free: 1.35 ng/mL
Prostate Specific Ag, Serum: 7.6 ng/mL — ABNORMAL HIGH (ref 0.0–4.0)

## 2018-06-06 LAB — HEMOGLOBIN A1C
Est. average glucose Bld gHb Est-mCnc: 200 mg/dL
Hgb A1c MFr Bld: 8.6 % — ABNORMAL HIGH (ref 4.8–5.6)

## 2018-06-06 LAB — LIPID PANEL
Chol/HDL Ratio: 4.7 ratio (ref 0.0–5.0)
Cholesterol, Total: 146 mg/dL (ref 100–199)
HDL: 31 mg/dL — AB (ref >39)
LDL Calculated: 53 mg/dL (ref 0–99)
Triglycerides: 312 mg/dL — ABNORMAL HIGH (ref 0–149)
VLDL CHOLESTEROL CAL: 62 mg/dL — AB (ref 5–40)

## 2018-06-10 ENCOUNTER — Telehealth: Payer: Self-pay | Admitting: *Deleted

## 2018-06-10 NOTE — Telephone Encounter (Signed)
Pt wants his bloodwork results for his PSA sent to DR. Wrenn. Office phone number is [phone]. Does not know fax number. Pt was notified medical records would send over.

## 2018-06-11 ENCOUNTER — Telehealth: Payer: Self-pay

## 2018-06-11 ENCOUNTER — Telehealth: Payer: Self-pay | Admitting: Family Medicine

## 2018-06-11 ENCOUNTER — Encounter: Payer: Self-pay | Admitting: Family Medicine

## 2018-06-11 DIAGNOSIS — N401 Enlarged prostate with lower urinary tract symptoms: Secondary | ICD-10-CM | POA: Diagnosis not present

## 2018-06-11 DIAGNOSIS — Z952 Presence of prosthetic heart valve: Secondary | ICD-10-CM

## 2018-06-11 DIAGNOSIS — R351 Nocturia: Secondary | ICD-10-CM | POA: Diagnosis not present

## 2018-06-11 DIAGNOSIS — R972 Elevated prostate specific antigen [PSA]: Secondary | ICD-10-CM | POA: Diagnosis not present

## 2018-06-11 NOTE — Telephone Encounter (Signed)
We appreciate his kindness we will see him at his next scheduled follow-up

## 2018-06-11 NOTE — Telephone Encounter (Signed)
Pt contacted office to let provider know that he went to neurologist today and the neurologist stated that he couldn't believe how good of health patient was in. Pt stated that he had the best doctor [place]. Pt stated they checked urine also. Pt stated that neurologist looked over records from past 7 years and stated that he hadn't changed much at all.

## 2018-06-11 NOTE — Telephone Encounter (Signed)
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

## 2018-06-11 NOTE — Telephone Encounter (Signed)
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 Scheduled patient for 1 year TAVR echo and OV with Nell Range 11/21/2018. DPR was grateful for call and agrees with treatment plan.

## 2018-06-18 NOTE — Telephone Encounter (Signed)
erica was this done. Can I close message?

## 2018-06-19 NOTE — Telephone Encounter (Signed)
Medical records [name] [name] ) states this was done and I can close message

## 2018-06-20 ENCOUNTER — Ambulatory Visit (INDEPENDENT_AMBULATORY_CARE_PROVIDER_SITE_OTHER): Payer: Medicare Other | Admitting: Cardiology

## 2018-06-20 ENCOUNTER — Encounter: Payer: Self-pay | Admitting: Cardiology

## 2018-06-20 VITALS — BP 124/68 | HR 85 | Ht 70.0 in | Wt 294.0 lb

## 2018-06-20 DIAGNOSIS — I5022 Chronic systolic (congestive) heart failure: Secondary | ICD-10-CM

## 2018-06-20 DIAGNOSIS — Z95810 Presence of automatic (implantable) cardiac defibrillator: Secondary | ICD-10-CM

## 2018-06-20 DIAGNOSIS — Z952 Presence of prosthetic heart valve: Secondary | ICD-10-CM | POA: Diagnosis not present

## 2018-06-20 DIAGNOSIS — I255 Ischemic cardiomyopathy: Secondary | ICD-10-CM | POA: Diagnosis not present

## 2018-06-20 DIAGNOSIS — I480 Paroxysmal atrial fibrillation: Secondary | ICD-10-CM | POA: Diagnosis not present

## 2018-06-20 MED ORDER — APIXABAN 5 MG PO TABS: 5.0000 mg | ORAL_TABLET | Freq: Two times a day (BID) | ORAL | 6 refills | Status: DC

## 2018-06-20 MED ORDER — APIXABAN 5 MG PO TABS: 5.0000 mg | ORAL_TABLET | Freq: Two times a day (BID) | ORAL | 0 refills | Status: DC

## 2018-06-20 NOTE — Addendum Note (Signed)
Addended by: Stanton Kidney on: 06/20/2018 04:19 PM   Modules accepted: Orders

## 2018-06-20 NOTE — Progress Notes (Signed)
he   Electrophysiology Office Note   Date:  06/20/2018   ID:  [name] [name], DOB 08-15-1951, MRN [id number]  PCP:  Kathyrn Drown, MD  Cardiologist:  Johnsie Cancel Primary Electrophysiologist:  Constance Haw, MD    No chief complaint on file.    History of Present Illness: [name] [name] is a 67 y.o. male who presents today for electrophysiology evaluation.   He has a history of morbid obesity, hypertension, hyperlipidemia, type 2 diabetes, ischemic cardiomyopathy with an EF of 15%, coronary disease status post CABG 6 in 2001 and subsequent PCI. He was admitted to [facility] 2/17 with CHF. He had a Medtronic ICD implanted 05/18/16.  In the spring 2019, he developed left bundle branch block.  It occurred after TAVR on 11/20/2017.   Today, denies symptoms of palpitations, chest pain, shortness of breath, orthopnea, PND, lower extremity edema, claudication, dizziness, presyncope, syncope, bleeding, or neurologic sequela. The patient is tolerating medications without difficulties.  He is feeling well.  He has no chest pain or shortness of breath.  He has gained some as per device interrogation.  He also admits to lower extremity edema.  He has not been quite compliant with his diuretics, but Maija Biggers be compliant over the next few days and feels that he can get back down to his normal weight.   Past Medical History:  Diagnosis Date  . AICD (automatic cardioverter/defibrillator) present 05/18/2016  . Anginal pain ([place])    occ none recent  . Aortic stenosis   . Arthritis   . CAD (coronary artery disease)   . Cardiomyopathy, ischemic   . CHF (congestive heart failure) ([place])   . Chronic back pain   . Diabetes mellitus without complication ([place])   . Elevated PSA   . History of kidney stones   . Hyperlipidemia   . Morbid obesity ([place])   . Myocardial infarction ([place])   . OSA (obstructive sleep apnea)    does not wear CPAP, -order run out told need to redo  test  . S/P CABG x 6  12/23/1999   LIMA to LAD, SVG to ramus, sequential SVG to OM2-RPL, sequential SVG to AM-PDA  . 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 APPENDECTOMY    . CARDIAC CATHETERIZATION     stents  . CAROTID STENT     denies carotid stent  . CATARACT EXTRACTION, BILATERAL    . CORONARY ARTERY BYPASS GRAFT  12/23/1999   LIMA to LAD, SVG to ramus, sequential SVG to OM2-RPL, sequential SVG to AM-PDA  . EP IMPLANTABLE DEVICE N/A 05/18/2016   Procedure: ICD Implant;  Surgeon: Travonna Swindle Meredith Leeds, MD;  Location: [place] CV [facility];  Service: Cardiovascular;  Laterality: N/A;  . EYE SURGERY    . ingrown toenail N/A   . LEFT HEART CATHETERIZATION WITH CORONARY/GRAFT ANGIOGRAM N/A 12/21/2014   Procedure: LEFT HEART CATHETERIZATION WITH Beatrix Fetters;  Surgeon: Belva Crome, MD;  Location: [facility] CATH LAB;  Service: Cardiovascular;  Laterality: N/A;  . NASAL SEPTOPLASTY W/ TURBINOPLASTY Bilateral 04/25/2017   NASAL SEPTOPLASTY WITH TURBINATE REDUCTION/notes 04/25/2017  . NASAL SEPTOPLASTY W/ TURBINOPLASTY Bilateral 04/25/2017   Procedure: NASAL SEPTOPLASTY WITH TURBINATE REDUCTION;  Surgeon: Leta Baptist, MD;  Location: [place];  Service: ENT;  Laterality: Bilateral;  . RIGHT/LEFT HEART CATH AND CORONARY/GRAFT ANGIOGRAPHY N/A 10/05/2017  Procedure: RIGHT/LEFT HEART CATH AND CORONARY/GRAFT ANGIOGRAPHY;  Surgeon: Sherren Mocha, MD;  Location: [place] [facility];  Service: Cardiovascular;  Laterality: N/A;  . TEE WITHOUT CARDIOVERSION N/A 11/20/2017   Procedure: TRANSESOPHAGEAL ECHOCARDIOGRAM (TEE);  Surgeon: Sherren Mocha, MD;  Location: [place];  Service: Open Heart Surgery;  Laterality: N/A;  . TONSILLECTOMY    . TRANSCATHETER AORTIC VALVE REPLACEMENT, TRANSFEMORAL N/A 11/20/2017   Procedure: TRANSCATHETER AORTIC VALVE REPLACEMENT,  TRANSFEMORAL;  Surgeon: Sherren Mocha, MD;  Location: [place];  Service: Open Heart Surgery;  Laterality: N/A;     Current Outpatient Medications  Medication Sig Dispense Refill  . amoxicillin (AMOXIL) 500 MG tablet Take 4 tablets (2,000 mg total) by mouth as directed. 4 tablet 11  . aspirin-acetaminophen-caffeine (EXCEDRIN MIGRAINE) 846-962-95 MG tablet Take 2 tablets by mouth daily as needed for headache.    Marland Kitchen aspirin-sod bicarb-citric acid (ALKA-SELTZER) 325 MG TBEF tablet Take 650 mg by mouth daily as needed (indigestion).    . carvedilol (COREG) 6.25 MG tablet Take 1 tablet (6.25 mg total) by mouth 2 (two) times daily. 180 tablet 3  . clopidogrel (PLAVIX) 75 MG tablet Take 1 tablet (75 mg total) by mouth daily. 90 tablet 3  . HYDROcodone-acetaminophen (NORCO) 10-325 MG tablet One bid prn pain 60 tablet 0  . insulin aspart (NOVOLOG FLEXPEN) 100 UNIT/ML FlexPen Inject 20 units into the skin with each meal. 15 mL 5  . Insulin Detemir (LEVEMIR FLEXTOUCH) 100 UNIT/ML Pen Inject 60 units into the skin each evening. May tirtrate up to 80 units 15 mL 5  . liver oil-zinc oxide (DESITIN) 40 % ointment Apply 1 application topically as needed for irritation.    . metolazone (ZAROXOLYN) 2.5 MG tablet TAKE ONE TABLET BY MOUTH DAILY. 30 tablet 6  . Multiple Vitamins-Minerals (CENTRUM SILVER PO) Take 2 tablets by mouth daily.     . nitroGLYCERIN (NITROSTAT) 0.4 MG SL tablet PLACE 1 TABLET UNDER TONGUE FOR CHEST PAIN. MAY REPEAT EVERY 5 MIN UPTO 3 DOSES-NO RELIEF,CALL 911. 25 tablet 6  . polycarbophil (FIBERCON) 625 MG tablet Take 2 tablets (1,250 mg total) by mouth daily with lunch. Hold if you have diarrhea 30 tablet 0  . potassium chloride SA (KLOR-CON M20) 20 MEQ tablet Take 2 tablets (40 mEq total) by mouth 3 (three) times daily. 540 tablet 1  . rosuvastatin (CRESTOR) 40 MG tablet TAKE ONE TABLET BY MOUTH DAILY. 90 tablet 1  . SURE COMFORT PEN NEEDLES 31G X 8 MM MISC USING 4 TIMES DAILY. 100 each 5  .  trolamine salicylate (ASPERCREME) 10 % cream Apply 1 application topically 2 (two) times daily as needed for muscle pain.     Marland Kitchen spironolactone (ALDACTONE) 25 MG tablet Take 1 tablet (25 mg total) by mouth daily. 90 tablet 3  . torsemide (DEMADEX) 20 MG tablet Take 1 tablet (20 mg total) by mouth as directed. 40 mg in the AM and 20 mg in the PM 180 tablet 3   No current facility-administered medications for this visit.     Allergies:   Dyflex-g [dyphylline-guaifenesin]   Social History:  The patient  reports that he quit smoking about 18 years ago. He has never used smokeless tobacco. He reports that he does not drink alcohol or use drugs.   Family History:  The patient's family history includes CAD in his mother; Diabetes in his mother. He was adopted.    ROS:  Please see the history of present illness.   Otherwise, review of systems  is positive for chest pain, chest pressure, leg pain, shortness of breath, constipation.   All other systems are reviewed and negative.   PHYSICAL EXAM: VS:  BP 124/68   Pulse 85   Ht 5\' 10"  (1.778 m)   Wt 294 lb (133.4 kg)   BMI 42.18 kg/m  , BMI Body mass index is 42.18 kg/m. GEN: Well nourished, well developed, in no acute distress  HEENT: normal  Neck: no JVD, carotid bruits, or masses Cardiac: RRR; no murmurs, rubs, or gallops, 1+ edema  Respiratory:  clear to auscultation bilaterally, normal work of breathing GI: soft, nontender, nondistended, + BS MS: no deformity or atrophy  Skin: warm and dry, device site well healed Neuro:  Strength and sensation are intact Psych: euthymic mood, full affect  EKG:  EKG is ordered today. Personal review of the ekg ordered shows sinus rhythm, IVCD  Personal review of the device interrogation today. Results in [place]: 11/15/2017: ALT 42; B Natriuretic Peptide 341.2 11/21/2017: Magnesium 1.9 11/28/2017: Hemoglobin 15.1; Platelets 221 06/05/2018: BUN 30; Creatinine, Ser 1.35; Potassium 3.4;  Sodium 137    Lipid Panel     Component Value Date/Time   CHOL 146 06/05/2018 1547   TRIG 312 (H) 06/05/2018 1547   HDL 31 (L) 06/05/2018 1547   CHOLHDL 4.7 06/05/2018 1547   CHOLHDL 3.9 01/02/2015 1010   VLDL 31 01/02/2015 1010   LDLCALC 53 06/05/2018 1547     Wt Readings from Last 3 Encounters:  06/20/18 294 lb (133.4 kg)  03/11/18 287 lb (130.2 kg)  01/04/18 287 lb 12.8 oz (130.5 kg)      Other studies Reviewed: Additional studies/ records that were reviewed today include: TTE 01/06/18  - Left ventricle: The cavity size was mildly dilated. Wall   thickness was increased in a pattern of mild LVH. LV EF 15-20%.   Diffuse hypokinesis. Features are consistent with a pseudonormal   left ventricular filling pattern, with concomitant abnormal   relaxation and increased filling pressure (grade 2 diastolic   dysfunction). - Aortic valve: Bioprosthetic aortic valve s/p TAVR. There was no   regurgitation. No significant bioprosthetic valve stenosis. Mean   gradient (S): 4 mm Hg. - Mitral valve: Mildly calcified annulus. There was trivial   regurgitation. - Left atrium: The atrium was severely dilated. - Right ventricle: The cavity size was mildly dilated. Pacer wire   or catheter noted in right ventricle. Systolic function was   moderately reduced. - Pulmonary arteries: No complete TR doppler jet so unable to   estimate PA systolic pressure. - Systemic veins: IVC measured 3 cm with < 50% respirophasic   variation, suggesting RA pressure 15 mmHg. - Pericardium, extracardiac: A trivial pericardial effusion was   identified.  ASSESSMENT AND PLAN:  1.  Ischemic cardiomyopathy: Ejection fraction 15%.  He has a Medtronic single-chamber ICD implanted 05/18/2016.  He currently has signs of volume overload and Alylah Blakney be more compliant with his diuretics.  He does have a left bundle branch block which occurred after his TAVR implant.  He is minimally symptomatic from his heart failure,  but if he does become symptomatic he would likely benefit from device upgrade.    2. CAD: Feeling well without chest pain.  3. Hypertension: Well-controlled.  No changes.  4.  Aortic stenosis: Status post TAVR valve.  Plan per primary cardiology.  It appears that his left bundle branch block was caused by implantation of his valve.  5.  Paroxysmal atrial  fibrillation: Found on device interrogation.  He does have a high stroke risk.  He has been asymptomatic, but had greater than 24 hours worth.  Lashanda Storlie start Eliquis and stop aspirin.  This patients CHA2DS2-VASc Score and unadjusted Ischemic Stroke Rate (% per year) is equal to 4.8 % stroke rate/year from a score of 4  Above score calculated as 1 point each if present [CHF, HTN, DM, Vascular=MI/PAD/Aortic Plaque, Age if 65-74, or Male] Above score calculated as 2 points each if present [Age > 75, or Stroke/TIA/TE]     Current medicines are reviewed at length with the patient today.   The patient does not have concerns regarding his medicines.  The following changes were made today: Stop aspirin, start Eliquis  Labs/ tests ordered today include:  Orders Placed This Encounter  Procedures  . EKG 12-Lead     Disposition:   FU with Octavian Godek 6 months  Signed, Adjoa Althouse Meredith Leeds, MD  06/20/2018 4:01 PM     [facility] [address] [place] [address] [phone] (office) [phone] (fax)

## 2018-06-20 NOTE — Telephone Encounter (Signed)
This is a duplicate it was sent electronically thank you

## 2018-06-20 NOTE — Patient Instructions (Addendum)
Medication Instructions:  Your physician has recommended you make the following change in your medication:  1. STOP Aspirin 2. START Eliquis 5 mg twice daily  If you need a refill on your cardiac medications before your next appointment, please call your pharmacy.   Lab work: None ordered  Testing/Procedures: None ordered  Follow-Up: Remote monitoring is used to monitor your  ICD from home. This monitoring reduces the number of office visits required to check your device to one time per year. It allows [place] to keep an eye on the functioning of your device to ensure it is working properly. You are scheduled for a device check from home on 08/26/2018. You may send your transmission at any time that day. If you have a wireless device, the transmission will be sent automatically. After your physician reviews your transmission, you will receive a postcard with your next transmission date.  At [facility], you and your health needs are our priority.  As part of our continuing mission to provide you with exceptional heart care, we have created designated Provider Care Teams.  These Care Teams include your primary Cardiologist (physician) and Advanced Practice Providers (APPs -  Physician Assistants and Nurse Practitioners) who all work together to provide you with the care you need, when you need it. You will need a follow up appointment in 6 months.  Please call our office 2 months in advance to schedule this appointment.  You may see Will Meredith Leeds, MD or one of the following Advanced Practice Providers on your designated Care Team:   Chanetta Marshall, NP . Tommye Standard, PA-C  Thank you for choosing [facility]!!   Trinidad Curet, RN [phone]   Any Other Special Instructions Will Be Listed Below (If Applicable). Apixaban oral tablets What is this medicine? APIXABAN (a PIX a ban) is an anticoagulant (blood thinner). It is used to lower the chance of stroke in people with a medical  condition called atrial fibrillation. It is also used to treat or prevent blood clots in the lungs or in the veins. This medicine may be used for other purposes; ask your health care provider or pharmacist if you have questions. COMMON BRAND NAME(S): Eliquis What should I tell my health care provider before I take this medicine? They need to know if you have any of these conditions: -bleeding disorders -bleeding in the brain -blood in your stools (black or tarry stools) or if you have blood in your vomit -history of stomach bleeding -kidney disease -liver disease -mechanical heart valve -an unusual or allergic reaction to apixaban, other medicines, foods, dyes, or preservatives -pregnant or trying to get pregnant -breast-feeding How should I use this medicine? Take this medicine by mouth with a glass of water. Follow the directions on the prescription label. You can take it with or without food. If it upsets your stomach, take it with food. Take your medicine at regular intervals. Do not take it more often than directed. Do not stop taking except on your doctor's advice. Stopping this medicine may increase your risk of a blot clot. Be sure to refill your prescription before you run out of medicine. Talk to your pediatrician regarding the use of this medicine in children. Special care may be needed. Overdosage: If you think you have taken too much of this medicine contact a poison control center or emergency room at once. NOTE: This medicine is only for you. Do not share this medicine with others. What if I miss a dose?  If you miss a dose, take it as soon as you can. If it is almost time for your next dose, take only that dose. Do not take double or extra doses. What may interact with this medicine? This medicine may interact with the following: -aspirin and aspirin-like medicines -certain medicines for fungal infections like ketoconazole and itraconazole -certain medicines for seizures like  carbamazepine and phenytoin -certain medicines that treat or prevent blood clots like warfarin, enoxaparin, and dalteparin -clarithromycin -NSAIDs, medicines for pain and inflammation, like ibuprofen or naproxen -rifampin -ritonavir -St. John's wort This list may not describe all possible interactions. Give your health care provider a list of all the medicines, herbs, non-prescription drugs, or dietary supplements you use. Also tell them if you smoke, drink alcohol, or use illegal drugs. Some items may interact with your medicine. What should I watch for while using this medicine? Visit your doctor or health care professional for regular checks on your progress. Notify your doctor or health care professional and seek emergency treatment if you develop breathing problems; changes in vision; chest pain; severe, sudden headache; pain, swelling, warmth in the leg; trouble speaking; sudden numbness or weakness of the face, arm or leg. These can be signs that your condition has gotten worse. If you are going to have surgery or other procedure, tell your doctor that you are taking this medicine. What side effects may I notice from receiving this medicine? Side effects that you should report to your doctor or health care professional as soon as possible: -allergic reactions like skin rash, itching or hives, swelling of the face, lips, or tongue -signs and symptoms of bleeding such as bloody or black, tarry stools; red or dark-brown urine; spitting up blood or brown material that looks like coffee grounds; red spots on the skin; unusual bruising or bleeding from the eye, gums, or nose This list may not describe all possible side effects. Call your doctor for medical advice about side effects. You may report side effects to FDA at 1-800-FDA-1088. Where should I keep my medicine? Keep out of the reach of children. Store at room temperature between 20 and 25 degrees C (68 and 77 degrees F). Throw away any  unused medicine after the expiration date. NOTE: This sheet is a summary. It may not cover all possible information. If you have questions about this medicine, talk to your doctor, pharmacist, or health care provider.  2018 Elsevier/Gold Standard (2016-03-06 11:54:23)

## 2018-06-26 LAB — CUP PACEART INCLINIC DEVICE CHECK
Battery Remaining Longevity: 125 mo
HIGH POWER IMPEDANCE MEASURED VALUE: 89 Ohm
Implantable Lead Implant Date: 20170921
Implantable Pulse Generator Implant Date: 20170921
Lead Channel Impedance Value: 399 Ohm
Lead Channel Pacing Threshold Amplitude: 0.75 V
Lead Channel Pacing Threshold Pulse Width: 0.4 ms
Lead Channel Setting Sensing Sensitivity: 0.3 mV
MDC IDC LEAD LOCATION: 753860
MDC IDC MSMT BATTERY VOLTAGE: 3.01 V
MDC IDC MSMT LEADCHNL RV IMPEDANCE VALUE: 456 Ohm
MDC IDC MSMT LEADCHNL RV SENSING INTR AMPL: 6.75 mV
MDC IDC SESS DTM: 20191024194535
MDC IDC SET LEADCHNL RV PACING AMPLITUDE: 2.5 V
MDC IDC SET LEADCHNL RV PACING PULSEWIDTH: 0.4 ms
MDC IDC STAT BRADY RV PERCENT PACED: 0.01 %

## 2018-07-01 ENCOUNTER — Ambulatory Visit (INDEPENDENT_AMBULATORY_CARE_PROVIDER_SITE_OTHER): Payer: Medicare Other | Admitting: Family Medicine

## 2018-07-01 ENCOUNTER — Encounter: Payer: Self-pay | Admitting: Family Medicine

## 2018-07-01 VITALS — BP 116/68 | HR 97 | Ht 70.0 in | Wt 293.8 lb

## 2018-07-01 DIAGNOSIS — E782 Mixed hyperlipidemia: Secondary | ICD-10-CM

## 2018-07-01 DIAGNOSIS — G894 Chronic pain syndrome: Secondary | ICD-10-CM

## 2018-07-01 DIAGNOSIS — Z23 Encounter for immunization: Secondary | ICD-10-CM | POA: Diagnosis not present

## 2018-07-01 DIAGNOSIS — Z79891 Long term (current) use of opiate analgesic: Secondary | ICD-10-CM

## 2018-07-01 DIAGNOSIS — I255 Ischemic cardiomyopathy: Secondary | ICD-10-CM

## 2018-07-01 DIAGNOSIS — E119 Type 2 diabetes mellitus without complications: Secondary | ICD-10-CM

## 2018-07-01 MED ORDER — HYDROCODONE-ACETAMINOPHEN 10-325 MG PO TABS: ORAL_TABLET | ORAL | 0 refills | Status: DC

## 2018-07-01 MED ORDER — ROSUVASTATIN CALCIUM 40 MG PO TABS: 40.0000 mg | ORAL_TABLET | Freq: Every day | ORAL | 1 refills | Status: DC

## 2018-07-01 NOTE — Patient Instructions (Signed)
Diabetes Mellitus and Nutrition When you have diabetes (diabetes mellitus), it is very important to have healthy eating habits because your blood sugar (glucose) levels are greatly affected by what you eat and drink. Eating healthy foods in the appropriate amounts, at about the same times every day, can help you:  Control your blood glucose.  Lower your risk of heart disease.  Improve your blood pressure.  Reach or maintain a healthy weight.  Every person with diabetes is different, and each person has different needs for a meal plan. Your health care provider may recommend that you work with a diet and nutrition specialist (dietitian) to make a meal plan that is best for you. Your meal plan may vary depending on factors such as:  The calories you need.  The medicines you take.  Your weight.  Your blood glucose, blood pressure, and cholesterol levels.  Your activity level.  Other health conditions you have, such as heart or kidney disease.  How do carbohydrates affect me? Carbohydrates affect your blood glucose level more than any other type of food. Eating carbohydrates naturally increases the amount of glucose in your blood. Carbohydrate counting is a method for keeping track of how many carbohydrates you eat. Counting carbohydrates is important to keep your blood glucose at a healthy level, especially if you use insulin or take certain oral diabetes medicines. It is important to know how many carbohydrates you can safely have in each meal. This is different for every person. Your dietitian can help you calculate how many carbohydrates you should have at each meal and for snack. Foods that contain carbohydrates include:  Bread, cereal, rice, pasta, and crackers.  Potatoes and corn.  Peas, beans, and lentils.  Milk and yogurt.  Fruit and juice.  Desserts, such as cakes, cookies, ice cream, and candy.  How does alcohol affect me? Alcohol can cause a sudden decrease in blood  glucose (hypoglycemia), especially if you use insulin or take certain oral diabetes medicines. Hypoglycemia can be a life-threatening condition. Symptoms of hypoglycemia (sleepiness, dizziness, and confusion) are similar to symptoms of having too much alcohol. If your health care provider says that alcohol is safe for you, follow these guidelines:  Limit alcohol intake to no more than 1 drink per day for nonpregnant women and 2 drinks per day for men. One drink equals 12 oz of beer, 5 oz of wine, or 1 oz of hard liquor.  Do not drink on an empty stomach.  Keep yourself hydrated with water, diet soda, or unsweetened iced tea.  Keep in mind that regular soda, juice, and other mixers may contain a lot of sugar and must be counted as carbohydrates.  What are tips for following this plan? Reading food labels  Start by checking the serving size on the label. The amount of calories, carbohydrates, fats, and other nutrients listed on the label are based on one serving of the food. Many foods contain more than one serving per package.  Check the total grams (g) of carbohydrates in one serving. You can calculate the number of servings of carbohydrates in one serving by dividing the total carbohydrates by 15. For example, if a food has 30 g of total carbohydrates, it would be equal to 2 servings of carbohydrates.  Check the number of grams (g) of saturated and trans fats in one serving. Choose foods that have low or no amount of these fats.  Check the number of milligrams (mg) of sodium in one serving. Most people   should limit total sodium intake to less than 2,300 mg per day.  Always check the nutrition information of foods labeled as "low-fat" or "nonfat". These foods may be higher in added sugar or refined carbohydrates and should be avoided.  Talk to your dietitian to identify your daily goals for nutrients listed on the label. Shopping  Avoid buying canned, premade, or processed foods. These  foods tend to be high in fat, sodium, and added sugar.  Shop around the outside edge of the grocery store. This includes fresh fruits and vegetables, bulk grains, fresh meats, and fresh dairy. Cooking  Use low-heat cooking methods, such as baking, instead of high-heat cooking methods like deep frying.  Cook using healthy oils, such as olive, canola, or sunflower oil.  Avoid cooking with butter, cream, or high-fat meats. Meal planning  Eat meals and snacks regularly, preferably at the same times every day. Avoid going long periods of time without eating.  Eat foods high in fiber, such as fresh fruits, vegetables, beans, and whole grains. Talk to your dietitian about how many servings of carbohydrates you can eat at each meal.  Eat 4-6 ounces of lean protein each day, such as lean meat, chicken, fish, eggs, or tofu. 1 ounce is equal to 1 ounce of meat, chicken, or fish, 1 egg, or 1/4 cup of tofu.  Eat some foods each day that contain healthy fats, such as avocado, nuts, seeds, and fish. Lifestyle   Check your blood glucose regularly.  Exercise at least 30 minutes 5 or more days each week, or as told by your health care provider.  Take medicines as told by your health care provider.  Do not use any products that contain nicotine or tobacco, such as cigarettes and e-cigarettes. If you need help quitting, ask your health care provider.  Work with a counselor or diabetes educator to identify strategies to manage stress and any emotional and social challenges. What are some questions to ask my health care provider?  Do I need to meet with a diabetes educator?  Do I need to meet with a dietitian?  What number can I call if I have questions?  When are the best times to check my blood glucose? Where to find more information:  American Diabetes Association: diabetes.org/food-and-fitness/food  Academy of Nutrition and Dietetics:  www.eatright.org/resources/health/diseases-and-conditions/diabetes  National Institute of Diabetes and Digestive and Kidney Diseases (NIH): www.niddk.nih.gov/health-information/diabetes/overview/diet-eating-physical-activity Summary  A healthy meal plan will help you control your blood glucose and maintain a healthy lifestyle.  Working with a diet and nutrition specialist (dietitian) can help you make a meal plan that is best for you.  Keep in mind that carbohydrates and alcohol have immediate effects on your blood glucose levels. It is important to count carbohydrates and to use alcohol carefully. This information is not intended to replace advice given to you by your health care provider. Make sure you discuss any questions you have with your health care provider. Document Released: 05/11/2005 Document Revised: 09/18/2016 Document Reviewed: 09/18/2016 Elsevier Interactive Patient Education  2018 [employer].  

## 2018-07-01 NOTE — Progress Notes (Signed)
Subjective:    Patient ID: [name] [name], male    DOB: 03/04/1951, 67 y.o.   MRN: [id number]  HPI This patient was seen today for chronic pain. Takes for right foot pain.   The medication list was reviewed and updated.   -Compliance with medication: yes  - Number patient states they take daily: depends on the pain. No more than 2 per day  -when was the last dose patient took? yesterday  The patient was advised the importance of maintaining medication and not using illegal substances with these.  Here for refills and follow up  The patient was educated that we can provide 3 monthly scripts for their medication, it is their responsibility to follow the instructions.  Side effects or complications from medications: none  Patient is aware that pain medications are meant to minimize the severity of the pain to allow their pain levels to improve to allow for better function. They are aware of that pain medications cannot totally remove their pain.  Due for UDT ( at least once per year) : due today.   Would like flu vaccine.   Some sob this morning.  He denies any shortness of breath currently no chest tightness pressure pain does have history of heart disease is on medicine  Patient's PSA recently elevated he was encouraged to see his urologist he states he just recently saw them   A1c shows subpar control he states he can do better with the diet he is compliant with his medicines no low sugar spells Triglycerides moderately elevated he will cut back on carbohydrates   Kidney function fair potassium slightly low he would do a better job taking his medications Patient here for follow-up regarding cholesterol.  The patient does have hyperlipidemia.  Patient does try to maintain a reasonable diet.  Patient does take the medication on a regular basis.  Denies missing a dose.  The patient denies any obvious side effects.  Prior blood work results reviewed with the patient.  The patient  is aware of his cholesterol goals and the need to keep it under good control to lessen the risk of disease.   Review of Systems  Constitutional: Negative for activity change.  HENT: Negative for congestion and rhinorrhea.   Respiratory: Positive for shortness of breath. Negative for cough.   Cardiovascular: Negative for chest pain.  Gastrointestinal: Negative for abdominal pain, diarrhea, nausea and vomiting.  Genitourinary: Negative for dysuria and hematuria.  Neurological: Negative for weakness and headaches.  Psychiatric/Behavioral: Negative for behavioral problems and confusion.       Objective:   Physical Exam  Constitutional: He appears well-nourished. No distress.  HENT:  Head: Normocephalic and atraumatic.  Eyes: Right eye exhibits no discharge. Left eye exhibits no discharge.  Neck: No tracheal deviation present.  Cardiovascular: Normal rate, regular rhythm and normal heart sounds.  No murmur heard. Pulmonary/Chest: Effort normal and breath sounds normal. No respiratory distress.  Musculoskeletal: He exhibits no edema.  Lymphadenopathy:    He has no cervical adenopathy.  Neurological: He is alert. Coordination normal.  Skin: Skin is warm and dry.  Psychiatric: He has a normal mood and affect. His behavior is normal.  Vitals reviewed.         Assessment & Plan:  The patient was seen in followup for chronic pain. A review over at their current pain status was discussed. Drug registry was checked. Prescriptions were given. Discussion was held regarding the importance of compliance with medication as well  as pain medication contract.  Time for questions regarding pain management plan occurred. Importance of regular followup visits was discussed. Patient was informed that medication may cause drowsiness and should not be combined  with other medications/alcohol or street drugs. Patient was cautioned that medication could cause drowsiness. If the patient feels  medication is causing altered alertness then do not drive or operate dangerous equipment.  Prescriptions were electronically sent in  His cardiac condition is followed by cardiologist  Patient states he saw his urologist for his elevated PSA just recently  Diabetes subpar control he will do a better job watching his diet plus also taking his medications  Blood pressure good control currently HTN- Patient was seen today as part of a visit regarding hypertension. The importance of healthy diet and regular physical activity was discussed. The importance of compliance with medications discussed.  Ideal goal is to keep blood pressure low elevated levels certainly below 847/84 when possible.  The patient was counseled that keeping blood pressure under control lessen his risk of complications.  The importance of regular follow-ups was discussed with the patient.  Low-salt diet such as DASH recommended.  Regular physical activity was recommended as well.  Patient was advised to keep regular follow-ups.  The patient was seen today as part of an evaluation regarding hyperlipidemia.  Recent lab work has been reviewed with the patient as well as the goals for good cholesterol care.  In addition to this medications have been discussed the importance of compliance with diet and medications discussed as well.  Finally the patient is aware that poor control of cholesterol, noncompliance can dramatically increase the risk of complications. The patient will keep regular office visits and the patient does agreed to periodic lab work.

## 2018-07-04 LAB — TOXASSURE SELECT 13 (MW), URINE

## 2018-07-10 ENCOUNTER — Other Ambulatory Visit: Payer: Self-pay | Admitting: Family Medicine

## 2018-07-10 NOTE — Telephone Encounter (Signed)
Please connect with pharmacy they need to get this refill through his cardiologist thank you

## 2018-07-11 ENCOUNTER — Other Ambulatory Visit: Payer: Self-pay | Admitting: Family Medicine

## 2018-07-11 NOTE — Telephone Encounter (Signed)
Please note THIS IS A REPEAT Please have pharmacy call his cardiologist Please call the pharmacy this so they dont keep resending!!

## 2018-07-12 NOTE — Telephone Encounter (Signed)
I spoke with [name] [name] at [facility] he is aware to make note of the need to send to Cardiologist.

## 2018-07-15 ENCOUNTER — Other Ambulatory Visit: Payer: Self-pay | Admitting: Cardiovascular Disease

## 2018-08-07 ENCOUNTER — Other Ambulatory Visit: Payer: Self-pay | Admitting: Family Medicine

## 2018-08-14 IMAGING — CR DG ABDOMEN 2V
3 series · 3 of 3 positions shown · non-contrast
Comparison: Three days ago

CLINICAL DATA: Constipation

EXAM:
ABDOMEN - 2 VIEW

[w abdomen upright]
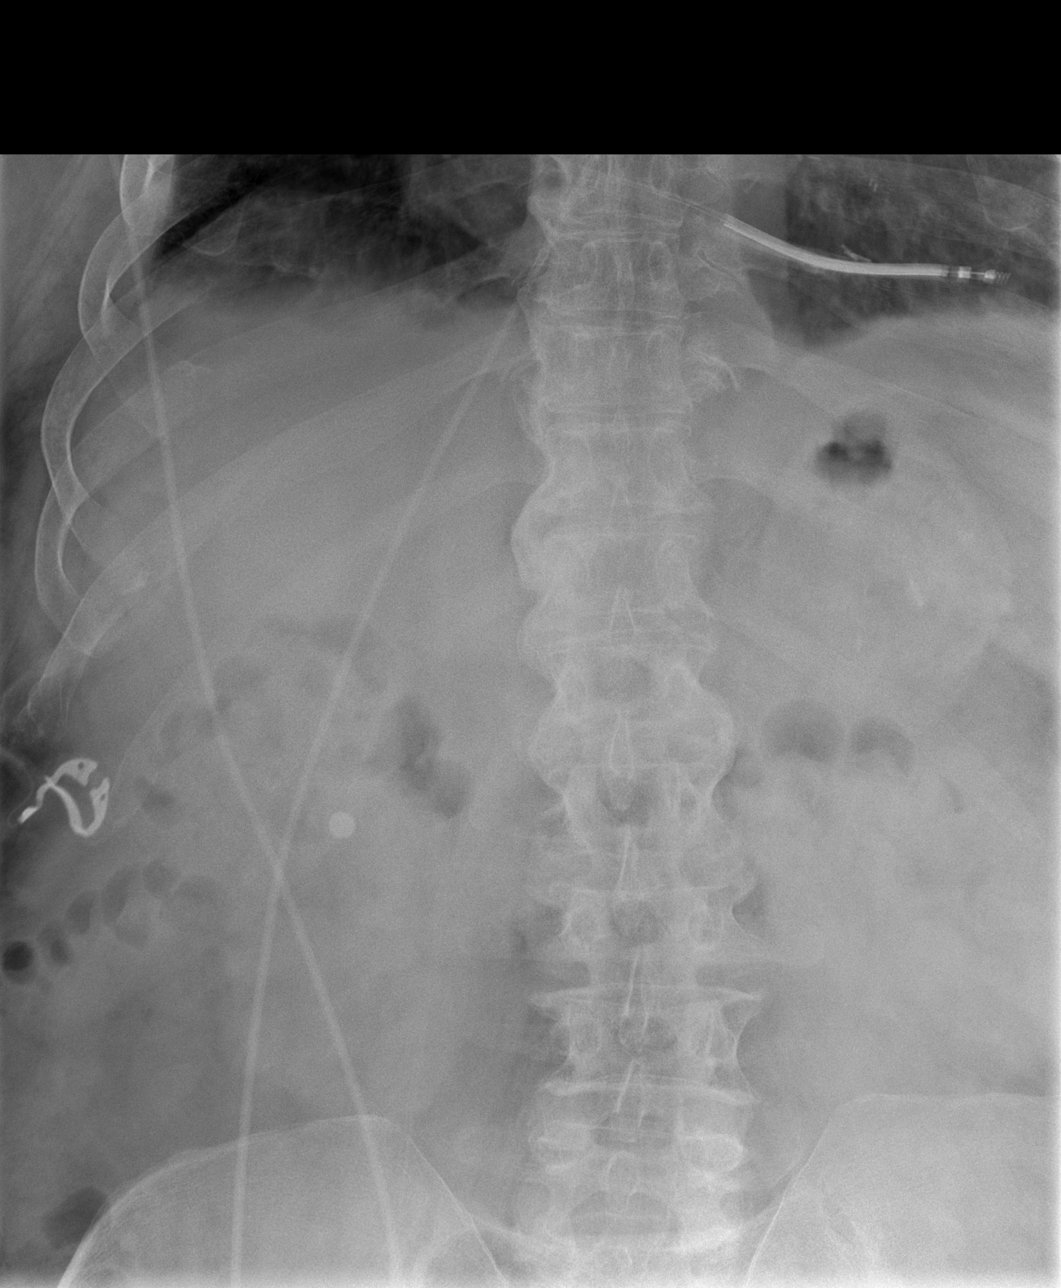

[t abdomen supine (1 of 2)]
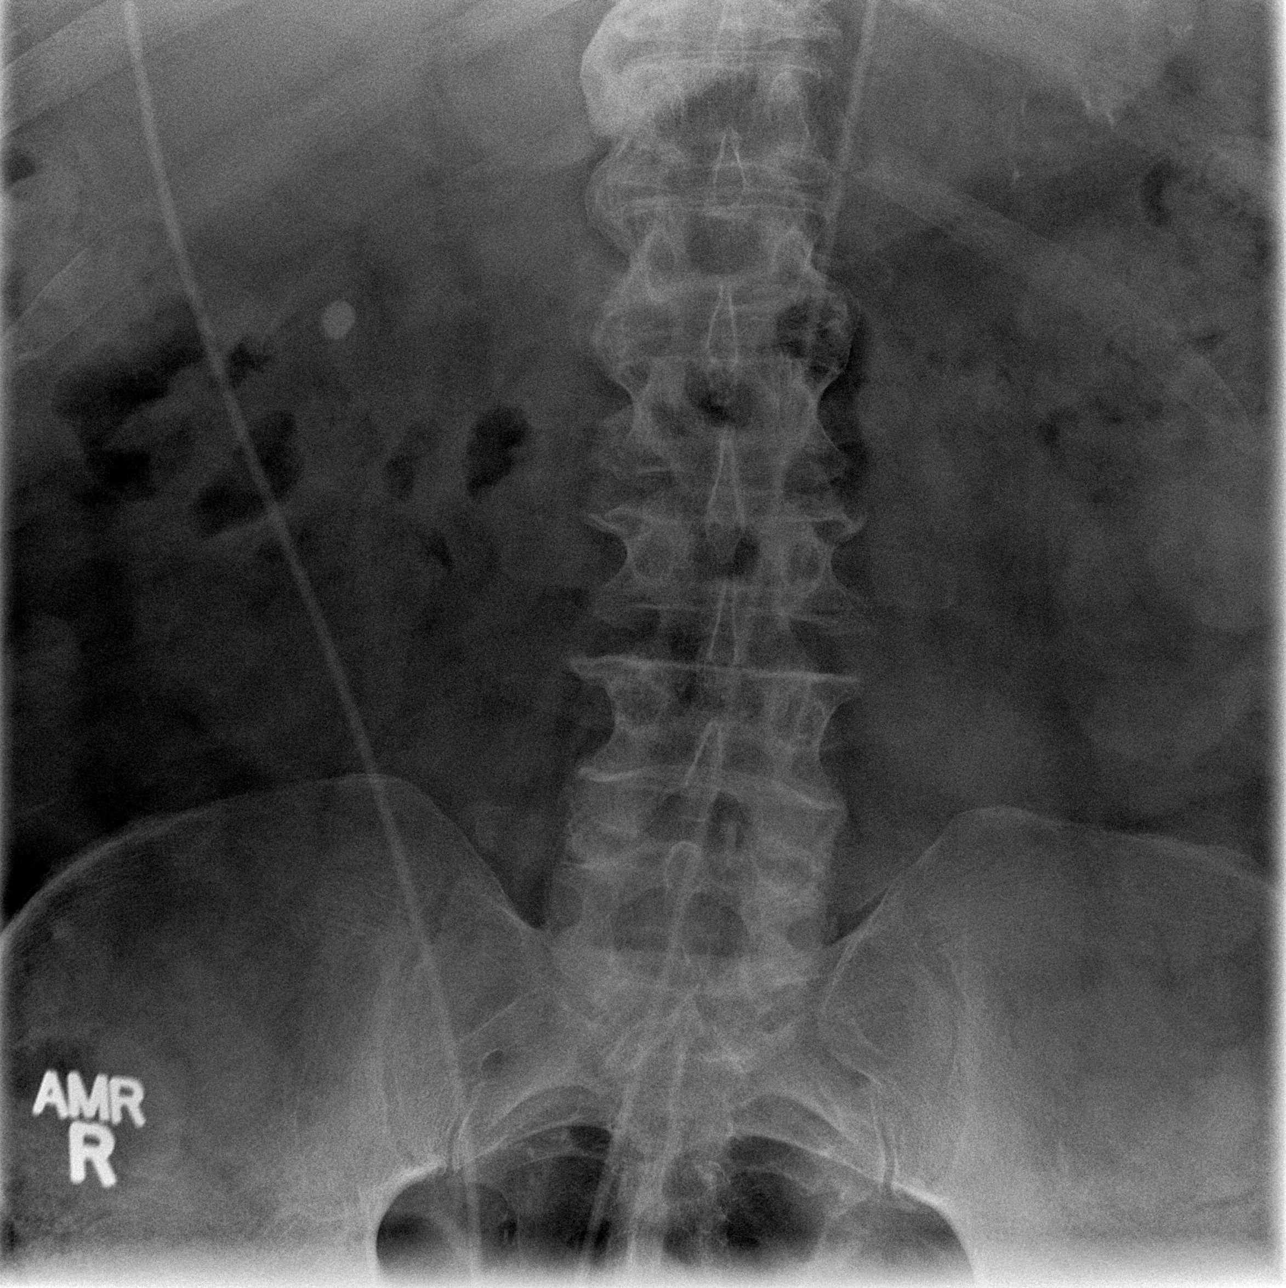

[t abdomen supine (2 of 2)]
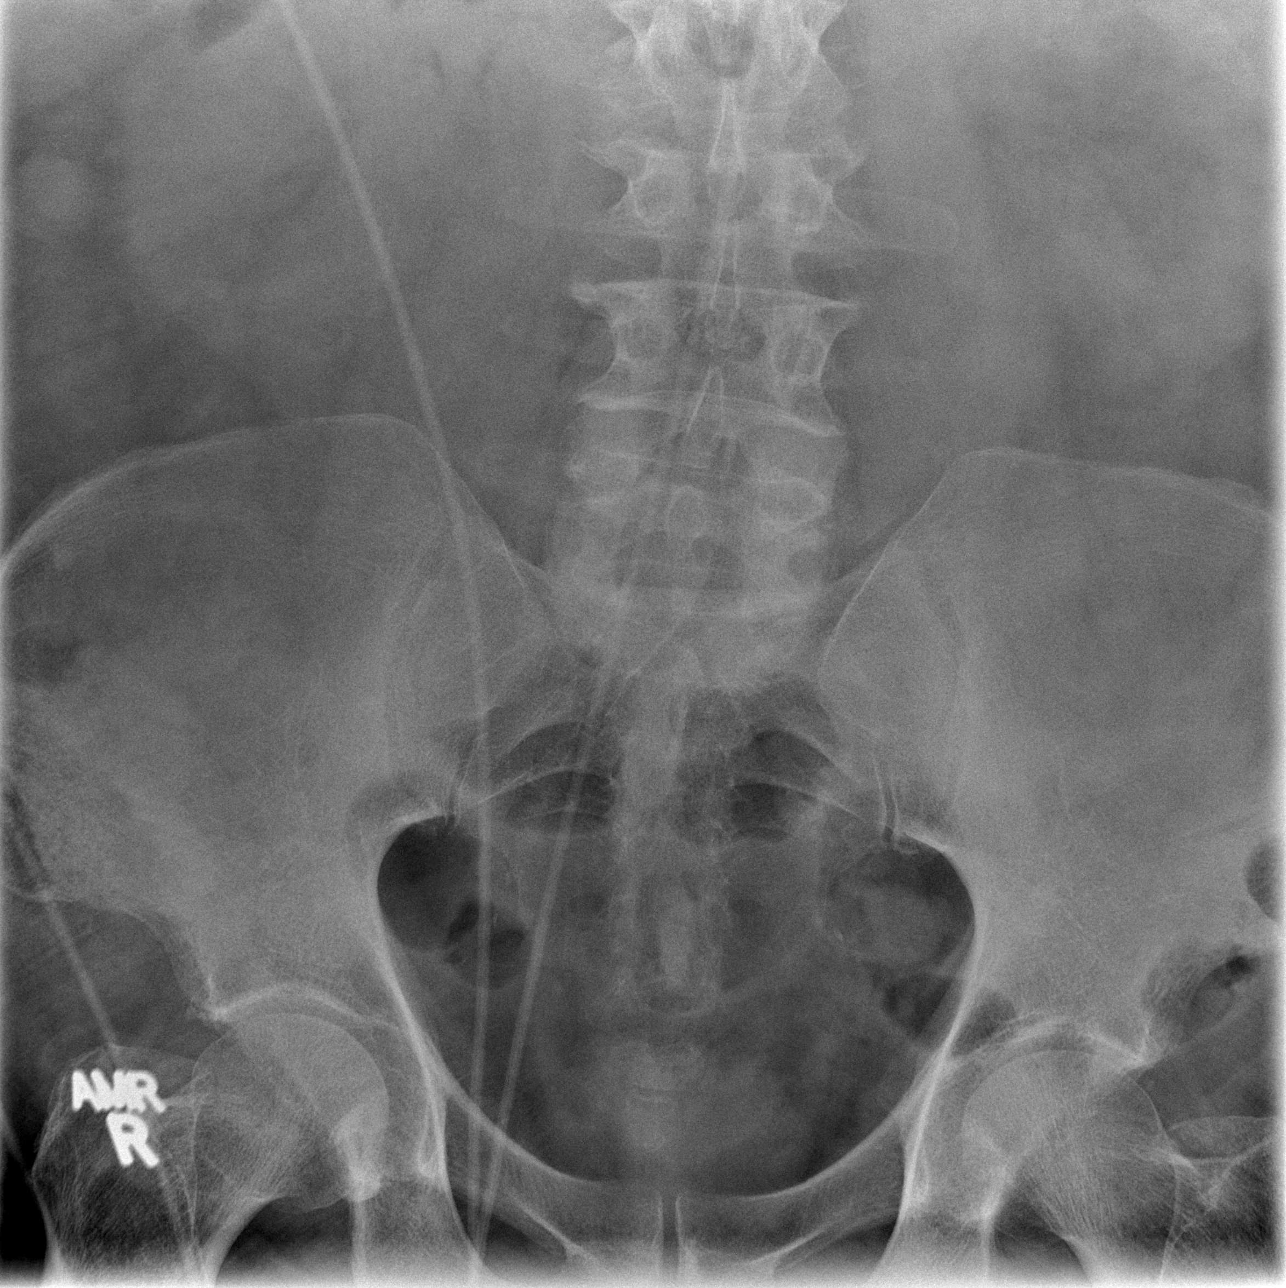

[3 of 3 positions shown; findings below may reference images not displayed]

FINDINGS: Normal bowel gas pattern. No abnormal stool retention or rectal
impaction.

Stone over the right abdomen which could be renal, biliary, or
other. Atherosclerotic calcification and spondylosis.

Cardiomegaly.  Transvenous ICD.
IMPRESSION: No abnormal stool retention or rectal impaction. Normal bowel gas
pattern.

## 2018-08-26 ENCOUNTER — Other Ambulatory Visit: Payer: Self-pay | Admitting: Family Medicine

## 2018-08-26 ENCOUNTER — Ambulatory Visit (INDEPENDENT_AMBULATORY_CARE_PROVIDER_SITE_OTHER): Payer: Medicare Other

## 2018-08-26 DIAGNOSIS — I255 Ischemic cardiomyopathy: Secondary | ICD-10-CM

## 2018-08-26 DIAGNOSIS — I5022 Chronic systolic (congestive) heart failure: Secondary | ICD-10-CM

## 2018-08-26 NOTE — Progress Notes (Signed)
Remote ICD transmission.   

## 2018-08-27 LAB — CUP PACEART REMOTE DEVICE CHECK
Battery Remaining Longevity: 124 mo
Brady Statistic RV Percent Paced: 0.01 %
Date Time Interrogation Session: 20191230063308
HIGH POWER IMPEDANCE MEASURED VALUE: 85 Ohm
Implantable Lead Implant Date: 20170921
Implantable Pulse Generator Implant Date: 20170921
Lead Channel Impedance Value: 342 Ohm
Lead Channel Pacing Threshold Amplitude: 0.75 V
Lead Channel Pacing Threshold Pulse Width: 0.4 ms
Lead Channel Sensing Intrinsic Amplitude: 3.375 mV
Lead Channel Setting Pacing Pulse Width: 0.4 ms
MDC IDC LEAD LOCATION: 753860
MDC IDC MSMT BATTERY VOLTAGE: 3.01 V
MDC IDC MSMT LEADCHNL RV IMPEDANCE VALUE: 437 Ohm
MDC IDC MSMT LEADCHNL RV SENSING INTR AMPL: 3.375 mV
MDC IDC SET LEADCHNL RV PACING AMPLITUDE: 2.5 V
MDC IDC SET LEADCHNL RV SENSING SENSITIVITY: 0.3 mV

## 2018-08-29 ENCOUNTER — Telehealth: Payer: Self-pay

## 2018-08-29 NOTE — Telephone Encounter (Signed)
Patient called today stating he needs an increase on his Torsemide 20 mg he states he is running out before time. He states he is having some fluid retention and some shortness of breath. He takes the Torsemide 20 mg one po bid and Zaroxolyn 2.5 one po Qd. He is not in distress, he just wants the medication number increased. He has a history of CHF, and CKD. I spoke with Dr.Scott Luking whom recommends that the pt increase his Toresemide 20 mg to three tonight and then three in the am and for pt to come in in the am for an office visit. Pt understands all. He was transferred up front to set up an appointment.

## 2018-08-30 ENCOUNTER — Ambulatory Visit (INDEPENDENT_AMBULATORY_CARE_PROVIDER_SITE_OTHER): Payer: Medicare Other | Admitting: Family Medicine

## 2018-08-30 ENCOUNTER — Telehealth: Payer: Self-pay

## 2018-08-30 ENCOUNTER — Encounter: Payer: Self-pay | Admitting: Family Medicine

## 2018-08-30 VITALS — BP 120/80 | HR 86 | Temp 98.4°F | Ht 70.0 in | Wt 298.0 lb

## 2018-08-30 DIAGNOSIS — R600 Localized edema: Secondary | ICD-10-CM

## 2018-08-30 DIAGNOSIS — I10 Essential (primary) hypertension: Secondary | ICD-10-CM | POA: Diagnosis not present

## 2018-08-30 DIAGNOSIS — E119 Type 2 diabetes mellitus without complications: Secondary | ICD-10-CM

## 2018-08-30 DIAGNOSIS — I5042 Chronic combined systolic (congestive) and diastolic (congestive) heart failure: Secondary | ICD-10-CM

## 2018-08-30 MED ORDER — TORSEMIDE 20 MG PO TABS: ORAL_TABLET | ORAL | 5 refills | Status: DC

## 2018-08-30 NOTE — Telephone Encounter (Signed)
Called pt today to discuss remote check and elevated optivol. He had just left his PCP to discuss his fluid overload. According to pt, his PCP increased his torsemide to 40mg  bid x 1 week. I called and verified with Dr Malachy Moan CMA. Pt's PCP instructed him to lower his sodium and sugar intake and follow up with him in 4 weeks.   Pt was informed Dr Curt Bears had recommendations as well, but since they were similar to his PCP's recommendations, I advised him to follow those instructions for the sake of simplicity. Dr Lance Sell office was informed of Dr Macky Lower recommendations as well.   Pt verbalized understanding and had no additional questions.

## 2018-08-30 NOTE — Telephone Encounter (Signed)
-----   Message from Will Meredith Leeds, MD sent at 08/30/2018 11:07 AM EST ----- Abnormal device interrogation reviewed.  Lead parameters and battery status stable.  OptiVol elevated.  Needs torsemide 40 mg twice a day for 1 week.  After that go back to 40 in the morning, 20 in the afternoon.

## 2018-08-30 NOTE — Progress Notes (Signed)
Subjective:    Patient ID: [name] [name], male    DOB: 07/06/1951, 68 y.o.   MRN: [id number]  HPI Patient is here today with complaints of fluid retention in his legs and some shortness of breath. He is on torsemide 20 mg one bid and zaroxolyn 2.5 once per day. He called yesterday with these complaints and was told to take torsemide 20 three last night and three this am and come in this am for recheck as the pt has a history of CHF,and CKD. Patient states his weight fluctuates he tries to check in on a regular basis but not always for thing in the morning he did not bring any list of his weights today  He also is complaining of a headache and abdominal pain he states this started after starting Eliquis,which he stopped him self.  The patient states he started taking Eliquis and he started having headaches and he stopped it a few days after starting it he also states his legs ached when he was taking it so he stopped the Eliquis he is taking only aspirin  Review of Systems  Constitutional: Negative for diaphoresis and fatigue.  HENT: Negative for congestion and rhinorrhea.   Respiratory: Positive for shortness of breath. Negative for cough.   Cardiovascular: Positive for leg swelling. Negative for chest pain and palpitations.  Gastrointestinal: Negative for abdominal pain and diarrhea.  Skin: Negative for color change and rash.  Neurological: Negative for dizziness and headaches.  Psychiatric/Behavioral: Negative for behavioral problems and confusion.       Objective:   Physical Exam Vitals signs reviewed.  Constitutional:      General: He is not in acute distress. HENT:     Head: Normocephalic and atraumatic.  Eyes:     General:        Right eye: No discharge.        Left eye: No discharge.  Neck:     Trachea: No tracheal deviation.  Cardiovascular:     Rate and Rhythm: Normal rate. Rhythm irregular.     Heart sounds: Normal heart sounds. No murmur.  Pulmonary:     Effort:  Pulmonary effort is normal. No respiratory distress.     Breath sounds: Normal breath sounds.  Lymphadenopathy:     Cervical: No cervical adenopathy.  Skin:    General: Skin is warm and dry.  Neurological:     Mental Status: He is alert.     Coordination: Coordination normal.  Psychiatric:        Behavior: Behavior normal.   Trace edema in the legs        Assessment & Plan:  25 minutes was spent with the patient.  This statement verifies that 25 minutes was indeed spent with the patient.  More than 50% of this visit-total duration of the visit-was spent in counseling and coordination of care. The issues that the patient came in for today as reflected in the diagnosis (s) please refer to documentation for further details.  CHF-increase diuretic follow labs closely.  Patient will follow-up with cardiology in February warning signs regarding severe CHF discussed.  O2 saturation 94% Patient will start using torsemide 2 in the morning 2 at 3 PM He will also start taking his blood pressures and weights on a regular basis notify us if any problems he will follow-up with [place] in 4 weeks  He will follow-up with cardiology as well  Patient stopped taking Eliquis because he states it was causing numbness tingling  in his legs and arms he also states that he read this on the side effect profile I told him that that would be an unusual side effect but nonetheless he stopped taking Eliquis therefore we will need to consider a different blood thinner medicine we will touch base with cardiology to see if they have any particular recommendations one versus another I do not feel Coumadin would be a good choice for this patient because of the complexities of taking and monitoring the medicine

## 2018-08-30 NOTE — Patient Instructions (Signed)
DASH Eating Plan  DASH stands for "Dietary Approaches to Stop Hypertension." The DASH eating plan is a healthy eating plan that has been shown to reduce high blood pressure (hypertension). It may also reduce your risk for type 2 diabetes, heart disease, and stroke. The DASH eating plan may also help with weight loss.  What are tips for following this plan?    General guidelines   Avoid eating more than 2,300 mg (milligrams) of salt (sodium) a day. If you have hypertension, you may need to reduce your sodium intake to 1,500 mg a day.   Limit alcohol intake to no more than 1 drink a day for nonpregnant women and 2 drinks a day for men. One drink equals 12 oz of beer, 5 oz of wine, or 1 oz of hard liquor.   Work with your health care provider to maintain a healthy body weight or to lose weight. Ask what an ideal weight is for you.   Get at least 30 minutes of exercise that causes your heart to beat faster (aerobic exercise) most days of the week. Activities may include walking, swimming, or biking.   Work with your health care provider or diet and nutrition specialist (dietitian) to adjust your eating plan to your individual calorie needs.  Reading food labels     Check food labels for the amount of sodium per serving. Choose foods with less than 5 percent of the Daily Value of sodium. Generally, foods with less than 300 mg of sodium per serving fit into this eating plan.   To find whole grains, look for the word "whole" as the first word in the ingredient list.  Shopping   Buy products labeled as "low-sodium" or "no salt added."   Buy fresh foods. Avoid canned foods and premade or frozen meals.  Cooking   Avoid adding salt when cooking. Use salt-free seasonings or herbs instead of table salt or sea salt. Check with your health care provider or pharmacist before using salt substitutes.   Do not fry foods. Cook foods using healthy methods such as baking, boiling, grilling, and broiling instead.   Cook with  heart-healthy oils, such as olive, canola, soybean, or sunflower oil.  Meal planning   Eat a balanced diet that includes:  ? 5 or more servings of fruits and vegetables each day. At each meal, try to fill half of your plate with fruits and vegetables.  ? Up to 6-8 servings of whole grains each day.  ? Less than 6 oz of lean meat, poultry, or fish each day. A 3-oz serving of meat is about the same size as a deck of cards. One egg equals 1 oz.  ? 2 servings of low-fat dairy each day.  ? A serving of nuts, seeds, or beans 5 times each week.  ? Heart-healthy fats. Healthy fats called Omega-3 fatty acids are found in foods such as flaxseeds and coldwater fish, like sardines, salmon, and mackerel.   Limit how much you eat of the following:  ? Canned or prepackaged foods.  ? Food that is high in trans fat, such as fried foods.  ? Food that is high in saturated fat, such as fatty meat.  ? Sweets, desserts, sugary drinks, and other foods with added sugar.  ? Full-fat dairy products.   Do not salt foods before eating.   Try to eat at least 2 vegetarian meals each week.   Eat more home-cooked food and less restaurant, buffet, and fast food.     When eating at a restaurant, ask that your food be prepared with less salt or no salt, if possible.  What foods are recommended?  The items listed may not be a complete list. Talk with your dietitian about what dietary choices are best for you.  Grains  Whole-grain or whole-wheat bread. Whole-grain or whole-wheat pasta. Brown rice. Oatmeal. Quinoa. Bulgur. Whole-grain and low-sodium cereals. Pita bread. Low-fat, low-sodium crackers. Whole-wheat flour tortillas.  Vegetables  Fresh or frozen vegetables (raw, steamed, roasted, or grilled). Low-sodium or reduced-sodium tomato and vegetable juice. Low-sodium or reduced-sodium tomato sauce and tomato paste. Low-sodium or reduced-sodium canned vegetables.  Fruits  All fresh, dried, or frozen fruit. Canned fruit in natural juice (without  added sugar).  Meat and other protein foods  Skinless chicken or turkey. Ground chicken or turkey. Pork with fat trimmed off. Fish and seafood. Egg whites. Dried beans, peas, or lentils. Unsalted nuts, nut butters, and seeds. Unsalted canned beans. Lean cuts of beef with fat trimmed off. Low-sodium, lean deli meat.  Dairy  Low-fat (1%) or fat-free (skim) milk. Fat-free, low-fat, or reduced-fat cheeses. Nonfat, low-sodium ricotta or cottage cheese. Low-fat or nonfat yogurt. Low-fat, low-sodium cheese.  Fats and oils  Soft margarine without trans fats. Vegetable oil. Low-fat, reduced-fat, or light mayonnaise and salad dressings (reduced-sodium). Canola, safflower, olive, soybean, and sunflower oils. Avocado.  Seasoning and other foods  Herbs. Spices. Seasoning mixes without salt. Unsalted popcorn and pretzels. Fat-free sweets.  What foods are not recommended?  The items listed may not be a complete list. Talk with your dietitian about what dietary choices are best for you.  Grains  Baked goods made with fat, such as croissants, muffins, or some breads. Dry pasta or rice meal packs.  Vegetables  Creamed or fried vegetables. Vegetables in a cheese sauce. Regular canned vegetables (not low-sodium or reduced-sodium). Regular canned tomato sauce and paste (not low-sodium or reduced-sodium). Regular tomato and vegetable juice (not low-sodium or reduced-sodium). Pickles. Olives.  Fruits  Canned fruit in a light or heavy syrup. Fried fruit. Fruit in cream or butter sauce.  Meat and other protein foods  Fatty cuts of meat. Ribs. Fried meat. Bacon. Sausage. Bologna and other processed lunch meats. Salami. Fatback. Hotdogs. Bratwurst. Salted nuts and seeds. Canned beans with added salt. Canned or smoked fish. Whole eggs or egg yolks. Chicken or turkey with skin.  Dairy  Whole or 2% milk, cream, and half-and-half. Whole or full-fat cream cheese. Whole-fat or sweetened yogurt. Full-fat cheese. Nondairy creamers. Whipped toppings.  Processed cheese and cheese spreads.  Fats and oils  Butter. Stick margarine. Lard. Shortening. Ghee. Bacon fat. Tropical oils, such as coconut, palm kernel, or palm oil.  Seasoning and other foods  Salted popcorn and pretzels. Onion salt, garlic salt, seasoned salt, table salt, and sea salt. Worcestershire sauce. Tartar sauce. Barbecue sauce. Teriyaki sauce. Soy sauce, including reduced-sodium. Steak sauce. Canned and packaged gravies. Fish sauce. Oyster sauce. Cocktail sauce. Horseradish that you find on the shelf. Ketchup. Mustard. Meat flavorings and tenderizers. Bouillon cubes. Hot sauce and Tabasco sauce. Premade or packaged marinades. Premade or packaged taco seasonings. Relishes. Regular salad dressings.  Where to find more information:   National Heart, Lung, and Blood Institute: www.nhlbi.nih.gov   American Heart Association: www.heart.org  Summary   The DASH eating plan is a healthy eating plan that has been shown to reduce high blood pressure (hypertension). It may also reduce your risk for type 2 diabetes, heart disease, and stroke.   With the   DASH eating plan, you should limit salt (sodium) intake to 2,300 mg a day. If you have hypertension, you may need to reduce your sodium intake to 1,500 mg a day.   When on the DASH eating plan, aim to eat more fresh fruits and vegetables, whole grains, lean proteins, low-fat dairy, and heart-healthy fats.   Work with your health care provider or diet and nutrition specialist (dietitian) to adjust your eating plan to your individual calorie needs.  This information is not intended to replace advice given to you by your health care provider. Make sure you discuss any questions you have with your health care provider.  Document Released: 08/03/2011 Document Revised: 08/07/2016 Document Reviewed: 08/07/2016  Elsevier Interactive Patient Education  2019 [employer]

## 2018-09-02 ENCOUNTER — Other Ambulatory Visit: Payer: Self-pay | Admitting: Cardiology

## 2018-09-12 DIAGNOSIS — R600 Localized edema: Secondary | ICD-10-CM | POA: Diagnosis not present

## 2018-09-12 DIAGNOSIS — I10 Essential (primary) hypertension: Secondary | ICD-10-CM | POA: Diagnosis not present

## 2018-09-12 DIAGNOSIS — E119 Type 2 diabetes mellitus without complications: Secondary | ICD-10-CM | POA: Diagnosis not present

## 2018-09-13 LAB — HEMOGLOBIN A1C
Est. average glucose Bld gHb Est-mCnc: 169 mg/dL
Hgb A1c MFr Bld: 7.5 % — ABNORMAL HIGH (ref 4.8–5.6)

## 2018-09-13 LAB — BASIC METABOLIC PANEL
BUN/Creatinine Ratio: 18 (ref 10–24)
BUN: 29 mg/dL — ABNORMAL HIGH (ref 8–27)
CALCIUM: 9.7 mg/dL (ref 8.6–10.2)
CO2: 32 mmol/L — ABNORMAL HIGH (ref 20–29)
Chloride: 91 mmol/L — ABNORMAL LOW (ref 96–106)
Creatinine, Ser: 1.63 mg/dL — ABNORMAL HIGH (ref 0.76–1.27)
GFR calc Af Amer: 50 mL/min/{1.73_m2} — ABNORMAL LOW (ref >59)
GFR calc non Af Amer: 43 mL/min/{1.73_m2} — ABNORMAL LOW (ref >59)
Glucose: 108 mg/dL — ABNORMAL HIGH (ref 65–99)
Potassium: 4 mmol/L (ref 3.5–5.2)
Sodium: 140 mmol/L (ref 134–144)

## 2018-09-13 LAB — BRAIN NATRIURETIC PEPTIDE: BNP: 477.4 pg/mL — AB (ref 0.0–100.0)

## 2018-09-16 ENCOUNTER — Other Ambulatory Visit: Payer: Self-pay | Admitting: Cardiovascular Disease

## 2018-09-25 ENCOUNTER — Encounter: Payer: Self-pay | Admitting: Family Medicine

## 2018-09-25 ENCOUNTER — Ambulatory Visit (INDEPENDENT_AMBULATORY_CARE_PROVIDER_SITE_OTHER): Payer: Medicare Other | Admitting: Family Medicine

## 2018-09-25 ENCOUNTER — Telehealth: Payer: Self-pay | Admitting: Family Medicine

## 2018-09-25 VITALS — BP 110/64 | Temp 97.7°F | Ht 70.0 in | Wt 288.0 lb

## 2018-09-25 DIAGNOSIS — G8929 Other chronic pain: Secondary | ICD-10-CM

## 2018-09-25 DIAGNOSIS — M545 Low back pain, unspecified: Secondary | ICD-10-CM

## 2018-09-25 DIAGNOSIS — I480 Paroxysmal atrial fibrillation: Secondary | ICD-10-CM | POA: Diagnosis not present

## 2018-09-25 DIAGNOSIS — I5042 Chronic combined systolic (congestive) and diastolic (congestive) heart failure: Secondary | ICD-10-CM | POA: Diagnosis not present

## 2018-09-25 DIAGNOSIS — N183 Chronic kidney disease, stage 3 unspecified: Secondary | ICD-10-CM

## 2018-09-25 DIAGNOSIS — I10 Essential (primary) hypertension: Secondary | ICD-10-CM

## 2018-09-25 DIAGNOSIS — E782 Mixed hyperlipidemia: Secondary | ICD-10-CM | POA: Diagnosis not present

## 2018-09-25 MED ORDER — AMOXICILLIN 500 MG PO TABS: 500.0000 mg | ORAL_TABLET | Freq: Three times a day (TID) | ORAL | 0 refills | Status: DC

## 2018-09-25 NOTE — Telephone Encounter (Signed)
Pt would like to know what he should take for a headache. He was told today to stop taking Excedrin and tylenol extra strength 500 does not work.   Pt asked to leave a detailed voice mail on cell if he doesn't answer.

## 2018-09-25 NOTE — Patient Instructions (Signed)
Do your lab around Feb 10th

## 2018-09-25 NOTE — Progress Notes (Signed)
Subjective:    Patient ID: [name] [name], male    DOB: Dec 24, 1950, 68 y.o.   MRN: [id number]  Diabetes  He presents for his follow-up diabetic visit. He has type 2 diabetes mellitus. Pertinent negatives for hypoglycemia include no confusion, dizziness or headaches. Pertinent negatives for diabetes include no chest pain and no fatigue. He is compliant with treatment all of the time. Home blood sugar record trend: pt brought in blood sugar readings.   A1C done bloodwork 7.5.  Patient show me his readings he checks his sugar several times per day denies any low sugar spells except for once patient relates he is trying to do a good job watching his diet  Toothache since yesterday.  Patient was advised to see his dentist  Cough and congestion. Green mucus. Started one week ago.  Relates about a week's worth of head congestion drainage coughing sinus congestion yellow mucus denies high fever chills sweats denies wheezing difficulty breathing  Patient with chronic low back pain intermittently takes pain medicine for this denies any other major issues currently  Has history of CHF swellings under good control recent lab work shows kidney function has gone up denies any PND orthopnea.  Will be seeing cardiology later in March    Review of Systems  Constitutional: Negative for diaphoresis and fatigue.  HENT: Negative for congestion and rhinorrhea.   Respiratory: Negative for cough and shortness of breath.   Cardiovascular: Negative for chest pain and leg swelling.  Gastrointestinal: Negative for abdominal pain and diarrhea.  Skin: Negative for color change and rash.  Neurological: Negative for dizziness and headaches.  Psychiatric/Behavioral: Negative for behavioral problems and confusion.       Objective:   Physical Exam Vitals signs reviewed.  Constitutional:      General: He is not in acute distress. HENT:     Head: Normocephalic and atraumatic.  Eyes:     General:        Right  eye: No discharge.        Left eye: No discharge.  Neck:     Trachea: No tracheal deviation.  Cardiovascular:     Rate and Rhythm: Normal rate.     Heart sounds: Normal heart sounds. No murmur.  Pulmonary:     Effort: Pulmonary effort is normal. No respiratory distress.     Breath sounds: Normal breath sounds.  Lymphadenopathy:     Cervical: No cervical adenopathy.  Skin:    General: Skin is warm and dry.  Neurological:     Mental Status: He is alert.     Coordination: Coordination normal.  Psychiatric:        Behavior: Behavior normal.           Assessment & Plan:  CHF Stable We will look into starting another anticoagulant for him Patient did not tolerate Eliquis He is using his Plavix  Chronic kidney disease along with CHF I recommend metolazone to be every other day at this point May continue to the torsemide at the current dosing Continue spironolactone  Chronic low back pain uses hydrocodone sparingly if he needs refills to let us know  Follow-up in 4 months to recheck the diabetes.  Hyperlipidemia continue medication previous labs reviewed  He does need to be on blood thinner to reduce risk of stroke.  He had problems with Eliquis relating side effects No bleeding issues I did communicate with cardiology they recommended trying either Xarelto or Pradaxa Based on his creatinine function Pradaxa 150  mg twice daily would be reasonable Another option would be Xarelto 15 mg daily based on his creatinine function  Based on his insurance Xarelto 15 mg is covered  Patient has paroxysmal atrial fibrillation puts him at high risk of stroke along with his cardiomyopathy currently cardiomyopathy under good control  Patient does have chronic CHF with poor ejection fraction cardiomyopathy under good control currently.  25 minutes was spent with the patient.  This statement verifies that 25 minutes was indeed spent with the patient.  More than 50% of this visit-total  duration of the visit-was spent in counseling and coordination of care. The issues that the patient came in for today as reflected in the diagnosis (s) please refer to documentation for further details.

## 2018-09-25 NOTE — Telephone Encounter (Signed)
Please advise. Thank you

## 2018-09-26 MED ORDER — TRAMADOL HCL 50 MG PO TABS: ORAL_TABLET | ORAL | 0 refills | Status: DC

## 2018-09-26 NOTE — Telephone Encounter (Signed)
Numb 20 tramado 50 mg one p o q 6 to 8 hrs prn h a

## 2018-09-26 NOTE — Telephone Encounter (Signed)
Printed awaiting signature.

## 2018-09-26 NOTE — Telephone Encounter (Signed)
I called left a message to r/c. 

## 2018-09-26 NOTE — Telephone Encounter (Signed)
So when stopping Excedrin typically a person can have withdrawal headaches for up to a week that is because Excedrin has caffeine in it.  With him being started on a blood thinner in the near future for heart related issues he cannot take the Excedrin  Tylenol is preferred  If he would like a prescription for tramadol sent in we can use this for a short span of time but it is not a long-term medication

## 2018-09-26 NOTE — Telephone Encounter (Signed)
Patient is aware we have sent medication to [facility].

## 2018-09-26 NOTE — Telephone Encounter (Signed)
Patient wants to know what is left otc that he can take other than the tylenol. He states Tylenol does not work. He is aware we can send Tramadol,but I explained that would be for a short span of time. He wants to know what is your definition of a short span of time ? He states his head is pounds and [place] dizzy and he needs some thing to take other than Tylenol.

## 2018-09-26 NOTE — Telephone Encounter (Signed)
So unfortunately there is nothing else Tylenol is what is available OTC  Once again when a person has been taking Excedrin Migraine or Excedrin pills on a regular basis when they stop these they typically will have withdrawal headaches for up to a week and a half

## 2018-09-26 NOTE — Telephone Encounter (Signed)
Discussed with pt. And now he would like the short span of tramadol that dr Nicki Reaper offered him this morning. Dr Nicki Reaper did not write rx. Told pt dr Nicki Reaper is gone this afternoon and it would be tomorrow before he got this message. He asked if dr Richardson Landry could write him rx because he would like this headache gone today.  [place].

## 2018-09-26 NOTE — Telephone Encounter (Signed)
Called left a message to r/c.

## 2018-09-29 ENCOUNTER — Encounter: Payer: Self-pay | Admitting: Family Medicine

## 2018-09-29 DIAGNOSIS — I480 Paroxysmal atrial fibrillation: Secondary | ICD-10-CM

## 2018-09-29 HISTORY — DX: Paroxysmal atrial fibrillation: I48.0

## 2018-10-01 ENCOUNTER — Other Ambulatory Visit: Payer: Self-pay

## 2018-10-01 ENCOUNTER — Other Ambulatory Visit: Payer: Self-pay | Admitting: Family Medicine

## 2018-10-01 ENCOUNTER — Telehealth: Payer: Self-pay | Admitting: *Deleted

## 2018-10-01 ENCOUNTER — Inpatient Hospital Stay (HOSPITAL_COMMUNITY)
Admission: EM | Admit: 2018-10-01 | Discharge: 2018-10-04 | DRG: 378 | Disposition: A | Discharge status: Home / Self Care | Payer: Medicare Other | Attending: Internal Medicine | Admitting: Internal Medicine

## 2018-10-01 ENCOUNTER — Encounter (HOSPITAL_COMMUNITY): Payer: Self-pay | Admitting: Emergency Medicine

## 2018-10-01 DIAGNOSIS — G4733 Obstructive sleep apnea (adult) (pediatric): Secondary | ICD-10-CM | POA: Diagnosis not present

## 2018-10-01 DIAGNOSIS — D62 Acute posthemorrhagic anemia: Secondary | ICD-10-CM

## 2018-10-01 DIAGNOSIS — T39395A Adverse effect of other nonsteroidal anti-inflammatory drugs [NSAID], initial encounter: Secondary | ICD-10-CM | POA: Diagnosis present

## 2018-10-01 DIAGNOSIS — Z953 Presence of xenogenic heart valve: Secondary | ICD-10-CM

## 2018-10-01 DIAGNOSIS — K2961 Other gastritis with bleeding: Secondary | ICD-10-CM | POA: Diagnosis present

## 2018-10-01 DIAGNOSIS — K222 Esophageal obstruction: Secondary | ICD-10-CM | POA: Diagnosis present

## 2018-10-01 DIAGNOSIS — I2510 Atherosclerotic heart disease of native coronary artery without angina pectoris: Secondary | ICD-10-CM | POA: Diagnosis present

## 2018-10-01 DIAGNOSIS — I350 Nonrheumatic aortic (valve) stenosis: Secondary | ICD-10-CM | POA: Diagnosis present

## 2018-10-01 DIAGNOSIS — K449 Diaphragmatic hernia without obstruction or gangrene: Secondary | ICD-10-CM | POA: Diagnosis present

## 2018-10-01 DIAGNOSIS — E1121 Type 2 diabetes mellitus with diabetic nephropathy: Secondary | ICD-10-CM

## 2018-10-01 DIAGNOSIS — Z7901 Long term (current) use of anticoagulants: Secondary | ICD-10-CM

## 2018-10-01 DIAGNOSIS — Z951 Presence of aortocoronary bypass graft: Secondary | ICD-10-CM

## 2018-10-01 DIAGNOSIS — E785 Hyperlipidemia, unspecified: Secondary | ICD-10-CM | POA: Diagnosis not present

## 2018-10-01 DIAGNOSIS — N182 Chronic kidney disease, stage 2 (mild): Secondary | ICD-10-CM | POA: Diagnosis present

## 2018-10-01 DIAGNOSIS — K250 Acute gastric ulcer with hemorrhage: Principal | ICD-10-CM

## 2018-10-01 DIAGNOSIS — E1122 Type 2 diabetes mellitus with diabetic chronic kidney disease: Secondary | ICD-10-CM | POA: Diagnosis not present

## 2018-10-01 DIAGNOSIS — Z6839 Body mass index (BMI) 39.0-39.9, adult: Secondary | ICD-10-CM | POA: Diagnosis not present

## 2018-10-01 DIAGNOSIS — I255 Ischemic cardiomyopathy: Secondary | ICD-10-CM | POA: Diagnosis not present

## 2018-10-01 DIAGNOSIS — D72829 Elevated white blood cell count, unspecified: Secondary | ICD-10-CM | POA: Diagnosis present

## 2018-10-01 DIAGNOSIS — G43909 Migraine, unspecified, not intractable, without status migrainosus: Secondary | ICD-10-CM | POA: Diagnosis present

## 2018-10-01 DIAGNOSIS — I480 Paroxysmal atrial fibrillation: Secondary | ICD-10-CM

## 2018-10-01 DIAGNOSIS — K922 Gastrointestinal hemorrhage, unspecified: Secondary | ICD-10-CM | POA: Diagnosis present

## 2018-10-01 DIAGNOSIS — I248 Other forms of acute ischemic heart disease: Secondary | ICD-10-CM | POA: Diagnosis present

## 2018-10-01 DIAGNOSIS — I5022 Chronic systolic (congestive) heart failure: Secondary | ICD-10-CM | POA: Diagnosis not present

## 2018-10-01 DIAGNOSIS — I130 Hypertensive heart and chronic kidney disease with heart failure and stage 1 through stage 4 chronic kidney disease, or unspecified chronic kidney disease: Secondary | ICD-10-CM | POA: Diagnosis not present

## 2018-10-01 DIAGNOSIS — Z95810 Presence of automatic (implantable) cardiac defibrillator: Secondary | ICD-10-CM

## 2018-10-01 DIAGNOSIS — Z79899 Other long term (current) drug therapy: Secondary | ICD-10-CM

## 2018-10-01 DIAGNOSIS — Z87891 Personal history of nicotine dependence: Secondary | ICD-10-CM

## 2018-10-01 DIAGNOSIS — I252 Old myocardial infarction: Secondary | ICD-10-CM

## 2018-10-01 DIAGNOSIS — Z7902 Long term (current) use of antithrombotics/antiplatelets: Secondary | ICD-10-CM

## 2018-10-01 DIAGNOSIS — K5909 Other constipation: Secondary | ICD-10-CM | POA: Diagnosis present

## 2018-10-01 DIAGNOSIS — K920 Hematemesis: Secondary | ICD-10-CM | POA: Diagnosis not present

## 2018-10-01 DIAGNOSIS — Z8249 Family history of ischemic heart disease and other diseases of the circulatory system: Secondary | ICD-10-CM

## 2018-10-01 DIAGNOSIS — K319 Disease of stomach and duodenum, unspecified: Secondary | ICD-10-CM | POA: Diagnosis present

## 2018-10-01 DIAGNOSIS — Z833 Family history of diabetes mellitus: Secondary | ICD-10-CM

## 2018-10-01 LAB — COMPREHENSIVE METABOLIC PANEL
ALT: 11 U/L (ref 0–44)
AST: 19 U/L (ref 15–41)
Albumin: 2.9 g/dL — ABNORMAL LOW (ref 3.5–5.0)
Alkaline Phosphatase: 52 U/L (ref 38–126)
Anion gap: 11 (ref 5–15)
BUN: 60 mg/dL — ABNORMAL HIGH (ref 8–23)
CO2: 30 mmol/L (ref 22–32)
Calcium: 8.3 mg/dL — ABNORMAL LOW (ref 8.9–10.3)
Chloride: 98 mmol/L (ref 98–111)
Creatinine, Ser: 1.25 mg/dL — ABNORMAL HIGH (ref 0.61–1.24)
GFR calc Af Amer: >60 mL/min (ref >60)
GFR calc non Af Amer: 59 mL/min — ABNORMAL LOW (ref >60)
Glucose, Bld: 200 mg/dL — ABNORMAL HIGH (ref 70–99)
Potassium: 3.4 mmol/L — ABNORMAL LOW (ref 3.5–5.1)
Sodium: 139 mmol/L (ref 135–145)
Total Bilirubin: 0.9 mg/dL (ref 0.3–1.2)
Total Protein: 6.1 g/dL — ABNORMAL LOW (ref 6.5–8.1)

## 2018-10-01 LAB — URINALYSIS, ROUTINE W REFLEX MICROSCOPIC
Bilirubin Urine: NEGATIVE
Glucose, UA: NEGATIVE mg/dL
Hgb urine dipstick: NEGATIVE
Ketones, ur: NEGATIVE mg/dL
Leukocytes, UA: NEGATIVE
Nitrite: NEGATIVE
Protein, ur: NEGATIVE mg/dL
Specific Gravity, Urine: 1.013 (ref 1.005–1.030)
pH: 7 (ref 5.0–8.0)

## 2018-10-01 LAB — POC OCCULT BLOOD, ED: FECAL OCCULT BLD: POSITIVE — AB

## 2018-10-01 LAB — CBC WITH DIFFERENTIAL/PLATELET
Abs Immature Granulocytes: 0.14 10*3/uL — ABNORMAL HIGH (ref 0.00–0.07)
Basophils Absolute: 0 10*3/uL (ref 0.0–0.1)
Basophils Relative: 0 %
Eosinophils Absolute: 0 10*3/uL (ref 0.0–0.5)
Eosinophils Relative: 0 %
HCT: 27.8 % — ABNORMAL LOW (ref 39.0–52.0)
Hemoglobin: 9 g/dL — ABNORMAL LOW (ref 13.0–17.0)
Immature Granulocytes: 1 %
Lymphocytes Relative: 7 %
Lymphs Abs: 1 10*3/uL (ref 0.7–4.0)
MCH: 31.8 pg (ref 26.0–34.0)
MCHC: 32.4 g/dL (ref 30.0–36.0)
MCV: 98.2 fL (ref 80.0–100.0)
Monocytes Absolute: 1.4 10*3/uL — ABNORMAL HIGH (ref 0.1–1.0)
Monocytes Relative: 10 %
Neutro Abs: 10.8 10*3/uL — ABNORMAL HIGH (ref 1.7–7.7)
Neutrophils Relative %: 82 %
Platelets: 289 10*3/uL (ref 150–400)
RBC: 2.83 MIL/uL — ABNORMAL LOW (ref 4.22–5.81)
RDW: 17.1 % — ABNORMAL HIGH (ref 11.5–15.5)
WBC: 13.3 10*3/uL — ABNORMAL HIGH (ref 4.0–10.5)
nRBC: 0.2 % (ref 0.0–0.2)

## 2018-10-01 LAB — ABO/RH: ABO/RH(D): A POS

## 2018-10-01 LAB — TROPONIN I: Troponin I: <0.03 ng/mL (ref <0.03)

## 2018-10-01 LAB — HEMOGLOBIN AND HEMATOCRIT, BLOOD
HCT: 26.2 % — ABNORMAL LOW (ref 39.0–52.0)
Hemoglobin: 8.2 g/dL — ABNORMAL LOW (ref 13.0–17.0)

## 2018-10-01 LAB — PREPARE RBC (CROSSMATCH)

## 2018-10-01 MED ORDER — MORPHINE SULFATE (PF) 4 MG/ML IV SOLN
6.0000 mg | Freq: Once | INTRAVENOUS | Status: DC
  Filled 2018-10-01: qty 2

## 2018-10-01 MED ORDER — SODIUM CHLORIDE 0.9 % IV SOLN
80.0000 mg | Freq: Once | INTRAVENOUS | Status: AC
  Administered 2018-10-01: 80 mg via INTRAVENOUS
  Filled 2018-10-01: qty 80

## 2018-10-01 MED ORDER — SODIUM CHLORIDE 0.9 % IV SOLN
INTRAVENOUS | Status: DC
  Administered 2018-10-01: 13:00:00 via INTRAVENOUS

## 2018-10-01 MED ORDER — MORPHINE SULFATE (PF) 2 MG/ML IV SOLN
2.0000 mg | INTRAVENOUS | Status: DC | PRN
  Administered 2018-10-03 – 2018-10-04 (×3): 2 mg via INTRAVENOUS
  Filled 2018-10-01 (×4): qty 1

## 2018-10-01 MED ORDER — HYDROCODONE-ACETAMINOPHEN 10-325 MG PO TABS: 1.0000 | ORAL_TABLET | Freq: Three times a day (TID) | ORAL | Status: DC | PRN

## 2018-10-01 MED ORDER — PROMETHAZINE HCL 12.5 MG PO TABS
12.5000 mg | ORAL_TABLET | Freq: Four times a day (QID) | ORAL | Status: DC | PRN
  Administered 2018-10-01: 12.5 mg via ORAL
  Filled 2018-10-01: qty 1

## 2018-10-01 MED ORDER — POTASSIUM CHLORIDE CRYS ER 20 MEQ PO TBCR
40.0000 meq | EXTENDED_RELEASE_TABLET | Freq: Every day | ORAL | Status: DC
  Administered 2018-10-01 – 2018-10-03 (×3): 40 meq via ORAL
  Filled 2018-10-01 (×3): qty 2

## 2018-10-01 MED ORDER — NITROGLYCERIN 0.4 MG SL SUBL
0.4000 mg | SUBLINGUAL_TABLET | SUBLINGUAL | Status: DC | PRN
  Administered 2018-10-01 – 2018-10-04 (×3): 0.4 mg via SUBLINGUAL
  Filled 2018-10-01 (×3): qty 1

## 2018-10-01 MED ORDER — ACETAMINOPHEN 325 MG PO TABS
650.0000 mg | ORAL_TABLET | Freq: Four times a day (QID) | ORAL | Status: DC | PRN
  Administered 2018-10-02 (×2): 650 mg via ORAL
  Filled 2018-10-01 (×2): qty 2

## 2018-10-01 MED ORDER — FUROSEMIDE 10 MG/ML IJ SOLN
20.0000 mg | Freq: Once | INTRAMUSCULAR | Status: AC
  Administered 2018-10-01: 20 mg via INTRAVENOUS
  Filled 2018-10-01: qty 2

## 2018-10-01 MED ORDER — MORPHINE SULFATE (PF) 2 MG/ML IV SOLN
2.0000 mg | Freq: Once | INTRAVENOUS | Status: AC
  Administered 2018-10-01: 2 mg via INTRAVENOUS
  Filled 2018-10-01: qty 1

## 2018-10-01 MED ORDER — METOPROLOL TARTRATE 5 MG/5ML IV SOLN
5.0000 mg | Freq: Once | INTRAVENOUS | Status: AC
  Administered 2018-10-01: 5 mg via INTRAVENOUS
  Filled 2018-10-01: qty 5

## 2018-10-01 MED ORDER — RIVAROXABAN 15 MG PO TABS: ORAL_TABLET | ORAL | 5 refills | Status: DC

## 2018-10-01 MED ORDER — MORPHINE SULFATE (PF) 4 MG/ML IV SOLN
4.0000 mg | Freq: Once | INTRAVENOUS | Status: AC
  Administered 2018-10-01: 4 mg via INTRAVENOUS
  Filled 2018-10-01: qty 1

## 2018-10-01 MED ORDER — SODIUM CHLORIDE 0.9 % IV SOLN
8.0000 mg/h | INTRAVENOUS | Status: DC
  Administered 2018-10-01 – 2018-10-02 (×2): 8 mg/h via INTRAVENOUS
  Filled 2018-10-01 (×7): qty 80

## 2018-10-01 MED ORDER — ACETAMINOPHEN 650 MG RE SUPP: 650.0000 mg | Freq: Four times a day (QID) | RECTAL | Status: DC | PRN

## 2018-10-01 MED ORDER — POLYETHYLENE GLYCOL 3350 17 G PO PACK
17.0000 g | PACK | Freq: Every day | ORAL | Status: DC | PRN
  Administered 2018-10-03: 17 g via ORAL
  Filled 2018-10-01: qty 1

## 2018-10-01 MED ORDER — SODIUM CHLORIDE 0.9% IV SOLUTION
Freq: Once | INTRAVENOUS | Status: AC
  Administered 2018-10-01: 23:00:00 via INTRAVENOUS

## 2018-10-01 MED ORDER — ROSUVASTATIN CALCIUM 20 MG PO TABS
40.0000 mg | ORAL_TABLET | Freq: Every day | ORAL | Status: DC
  Administered 2018-10-01 – 2018-10-03 (×3): 40 mg via ORAL
  Filled 2018-10-01 (×3): qty 2

## 2018-10-01 NOTE — Consult Note (Signed)
Referring Provider: [facility] ED Dr. Wilson Singer  Primary Care Physician:  Kathyrn Drown, MD Primary Gastroenterologist:  Dr. Oneida Alar   Date of Admission: 10/01/2018  Date of Consultation: 10/01/2018   Reason for Consultation:  GI bleed   HPI:  [name] [name] is a 68 y.o. year old male presenting to the ED today secondary to abdominal pain, black stool, and episode of coffee-ground emesis. Found to have Hgb 9.0, heme positive. Protonix infusion started. On Plavix chronically, and he had been on Eliquis in the past but has not taken Plavix for 2 days and no Eliquis at least since Jan 29th. He states he was unable to tolerate oral medications. Although Xarelto listed in outpatient medications, he was just prescribed this today but has not started.   Abdominal pain starting about 4 weeks ago, worsening 4 days ago. Black stool started yesterday evening, then loose like "black inkwell" overnight. Vomited after eating a cracker and saw coffee ground. Black stool this morning around 4am. No further melena. Wife saw some red residue when flushing the toilet. Abdominal pain has persisted, noted as diffusely. Sitting up and laying down on right side brings relief. Currently at a 2 out of 10. Had been taking excedrin for headaches. Taking a minimum about once a day if had a headache. Intermittently over the last year and a half.   No prior EGD or colonoscopy. Currently in no distress. Wife at bedside.   Past Medical History:  Diagnosis Date  . AICD (automatic cardioverter/defibrillator) present 05/18/2016  . Anginal pain ([place])    occ none recent  . Aortic stenosis   . Arthritis   . CAD (coronary artery disease)   . Cardiomyopathy, ischemic   . CHF (congestive heart failure) ([place])   . Chronic back pain   . Diabetes mellitus without complication ([place])   . Elevated PSA   . History of kidney stones   . Hyperlipidemia   . Morbid obesity ([place])   . Myocardial infarction ([place])   . OSA (obstructive sleep apnea)    does not wear CPAP, -order run out told need to redo  test  . Paroxysmal atrial fibrillation ([place]) 09/29/2018   This was found on event monitor October 2019  . 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 APPENDECTOMY    . CARDIAC CATHETERIZATION     stents  . CAROTID STENT     denies carotid stent  . CATARACT EXTRACTION, BILATERAL    . CORONARY ARTERY BYPASS GRAFT  12/23/1999   LIMA to LAD, SVG to ramus, sequential SVG to OM2-RPL, sequential SVG to AM-PDA  . EP IMPLANTABLE DEVICE N/A 05/18/2016   Procedure: ICD Implant;  Surgeon: Will Meredith Leeds, MD;  Location: [facility];  Service: Cardiovascular;  Laterality: N/A;  . EYE SURGERY    . ingrown toenail N/A   . LEFT HEART CATHETERIZATION WITH CORONARY/GRAFT ANGIOGRAM N/A 12/21/2014   Procedure: LEFT HEART CATHETERIZATION WITH Beatrix Fetters;  Surgeon: Belva Crome, MD;  Location: [facility] CATH [facility];  Service: Cardiovascular;  Laterality: N/A;  . NASAL SEPTOPLASTY W/ TURBINOPLASTY Bilateral 04/25/2017   NASAL SEPTOPLASTY WITH TURBINATE REDUCTION/notes 04/25/2017  . NASAL SEPTOPLASTY W/ TURBINOPLASTY Bilateral 04/25/2017  Procedure: NASAL SEPTOPLASTY WITH TURBINATE REDUCTION;  Surgeon: Leta Baptist, MD;  Location: [facility];  Service: ENT;  Laterality: Bilateral;  . RIGHT/LEFT HEART CATH AND CORONARY/GRAFT ANGIOGRAPHY N/A 10/05/2017   Procedure: RIGHT/LEFT HEART CATH AND CORONARY/GRAFT ANGIOGRAPHY;  Surgeon: Sherren Mocha, MD;  Location: [facility];  Service: Cardiovascular;  Laterality: N/A;  . TEE WITHOUT CARDIOVERSION N/A 11/20/2017   Procedure: TRANSESOPHAGEAL ECHOCARDIOGRAM (TEE);  Surgeon: Sherren Mocha, MD;  Location: [facility];  Service: Open  Heart Surgery;  Laterality: N/A;  . TONSILLECTOMY    . TRANSCATHETER AORTIC VALVE REPLACEMENT, TRANSFEMORAL N/A 11/20/2017   Procedure: TRANSCATHETER AORTIC VALVE REPLACEMENT, TRANSFEMORAL;  Surgeon: Sherren Mocha, MD;  Location: [facility];  Service: Open Heart Surgery;  Laterality: N/A;    Prior to Admission medications   Medication Sig Start Date End Date Taking? Authorizing Provider  carvedilol (COREG) 6.25 MG tablet Take 1 tablet (6.25 mg total) by mouth 2 (two) times daily. 11/28/17  Yes Weaver, Scott T, PA-C  clopidogrel (PLAVIX) 75 MG tablet TAKE ONE TABLET BY MOUTH DAILY. 07/15/18  Yes Josue Hector, MD  HYDROcodone-acetaminophen [facility]) 10-325 MG tablet One bid prn pain 07/01/18  Yes Luking, Scott A, MD  LEVEMIR FLEXTOUCH 100 UNIT/ML Pen INJECT 60 UNITS INTO THE SKIN EACH EVENING. MAY TITRATE UP TO 80 UNITS. 08/27/18  Yes Kathyrn Drown, MD  liver oil-zinc oxide (DESITIN) 40 % ointment Apply 1 application topically as needed for irritation.   Yes [provider]  metolazone (ZAROXOLYN) 2.5 MG tablet TAKE ONE TABLET BY MOUTH DAILY. 09/02/18  Yes BranchAlphonse Guild, MD  Multiple Vitamins-Minerals (CENTRUM SILVER PO) Take 2 tablets by mouth daily.    Yes [provider]  nitroGLYCERIN (NITROSTAT) 0.4 MG SL tablet PLACE 1 TABLET UNDER TONGUE FOR CHEST PAIN. MAY REPEAT EVERY 5 MIN UPTO 3 DOSES-NO RELIEF,CALL 911. 09/18/18  Yes Sherren Mocha, MD  NOVOLOG FLEXPEN 100 UNIT/ML FlexPen INJECT 20 UNITS INTO THE SKIN THREE TIMES DAILY WITH MEALS. 08/08/18  Yes Kathyrn Drown, MD  polycarbophil (FIBERCON) 625 MG tablet Take 2 tablets (1,250 mg total) by mouth daily with lunch. Hold if you have diarrhea 09/18/16  Yes Rosita Fire, MD  potassium chloride SA (KLOR-CON M20) 20 MEQ tablet Take 2 tablets (40 mEq total) by mouth 3 (three) times daily. 05/20/18 10/01/18 Yes BranchAlphonse Guild, MD  Rivaroxaban (XARELTO) 15 MG TABS tablet Take one tablet by mouth once daily 10/01/18  Yes  Luking, Scott A, MD  rosuvastatin (CRESTOR) 40 MG tablet Take 1 tablet (40 mg total) by mouth daily. 07/01/18  Yes Kathyrn Drown, MD  torsemide (DEMADEX) 20 MG tablet 2 qam and 2 q 3pm 08/30/18  Yes Luking, Elayne Snare, MD  traMADol (ULTRAM) 50 MG tablet Take one po Q 6-8 prn headache. 09/26/18  Yes Mikey Kirschner, MD  trolamine salicylate (ASPERCREME) 10 % cream Apply 1 application topically 2 (two) times daily as needed for muscle pain.    Yes [provider]  amoxicillin (AMOXIL) 500 MG tablet Take 4 tablets (2,000 mg total) by mouth as directed. Patient not taking: Reported on 10/01/2018 01/04/18   Sherren Mocha, MD  amoxicillin (AMOXIL) 500 MG tablet Take 1 tablet (500 mg total) by mouth 3 (three) times daily. Patient not taking: Reported on 10/01/2018 09/25/18   Kathyrn Drown, MD  spironolactone (ALDACTONE) 25 MG tablet Take 1 tablet (25 mg total) by mouth daily. 11/30/17 09/25/18  Liliane Shi, PA-C  SURE COMFORT PEN  NEEDLES 31G X 8 MM MISC USING 4 TIMES DAILY. 11/05/17   Kathyrn Drown, MD    Current Facility-Administered Medications  Medication Dose Route Frequency Provider Last Rate Last Dose  . 0.9 %  sodium chloride infusion   Intravenous Continuous Virgel Manifold, MD 75 mL/hr at 10/01/18 1304    . morphine 4 MG/ML injection 6 mg  6 mg Intravenous Once Virgel Manifold, MD      . pantoprazole (PROTONIX) 80 mg in sodium chloride 0.9 % 250 mL (0.32 mg/mL) infusion  8 mg/hr Intravenous Continuous Virgel Manifold, MD 25 mL/hr at 10/01/18 1339 8 mg/hr at 10/01/18 1339   Current Outpatient Medications  Medication Sig Dispense Refill  . carvedilol (COREG) 6.25 MG tablet Take 1 tablet (6.25 mg total) by mouth 2 (two) times daily. 180 tablet 3  . clopidogrel (PLAVIX) 75 MG tablet TAKE ONE TABLET BY MOUTH DAILY. 90 tablet 3  . HYDROcodone-acetaminophen (NORCO) 10-325 MG tablet One bid prn pain 60 tablet 0  . LEVEMIR FLEXTOUCH 100 UNIT/ML Pen INJECT 60 UNITS INTO THE SKIN EACH EVENING. MAY  TITRATE UP TO 80 UNITS. 15 mL 0  . liver oil-zinc oxide (DESITIN) 40 % ointment Apply 1 application topically as needed for irritation.    . metolazone (ZAROXOLYN) 2.5 MG tablet TAKE ONE TABLET BY MOUTH DAILY. 30 tablet 3  . Multiple Vitamins-Minerals (CENTRUM SILVER PO) Take 2 tablets by mouth daily.     . nitroGLYCERIN (NITROSTAT) 0.4 MG SL tablet PLACE 1 TABLET UNDER TONGUE FOR CHEST PAIN. MAY REPEAT EVERY 5 MIN UPTO 3 DOSES-NO RELIEF,CALL 911. 25 tablet 6  . NOVOLOG FLEXPEN 100 UNIT/ML FlexPen INJECT 20 UNITS INTO THE SKIN THREE TIMES DAILY WITH MEALS. 15 mL 5  . polycarbophil (FIBERCON) 625 MG tablet Take 2 tablets (1,250 mg total) by mouth daily with lunch. Hold if you have diarrhea 30 tablet 0  . potassium chloride SA (KLOR-CON M20) 20 MEQ tablet Take 2 tablets (40 mEq total) by mouth 3 (three) times daily. 540 tablet 1  . Rivaroxaban (XARELTO) 15 MG TABS tablet Take one tablet by mouth once daily 30 tablet 5  . rosuvastatin (CRESTOR) 40 MG tablet Take 1 tablet (40 mg total) by mouth daily. 90 tablet 1  . torsemide (DEMADEX) 20 MG tablet 2 qam and 2 q 3pm 120 tablet 5  . traMADol (ULTRAM) 50 MG tablet Take one po Q 6-8 prn headache. 20 tablet 0  . trolamine salicylate (ASPERCREME) 10 % cream Apply 1 application topically 2 (two) times daily as needed for muscle pain.     Marland Kitchen amoxicillin (AMOXIL) 500 MG tablet Take 4 tablets (2,000 mg total) by mouth as directed. (Patient not taking: Reported on 10/01/2018) 4 tablet 11  . amoxicillin (AMOXIL) 500 MG tablet Take 1 tablet (500 mg total) by mouth 3 (three) times daily. (Patient not taking: Reported on 10/01/2018) 30 tablet 0  . spironolactone (ALDACTONE) 25 MG tablet Take 1 tablet (25 mg total) by mouth daily. 90 tablet 3  . SURE COMFORT PEN NEEDLES 31G X 8 MM MISC USING 4 TIMES DAILY. 100 each 5    Allergies as of 10/01/2018 - Review Complete 10/01/2018  Allergen Reaction Noted  . Dyflex-g [dyphylline-guaifenesin] Hives 05/03/2013  . Eliquis  [apixaban] Other (See Comments) 09/29/2018    Family History  Adopted: Yes  Problem Relation Age of Onset  . Diabetes Mother   . CAD Mother   . Colon cancer Neg Hx   . Colon polyps Neg Hx  Social History   Socioeconomic History  . Marital status: Married    Spouse name: Not on file  . Number of children: Not on file  . Years of education: Not on file  . Highest education level: Not on file  Occupational History  . Occupation: retired    Comment: Comfort: [employer]  . Financial resource strain: Not on file  . Food insecurity:    Worry: Not on file    Inability: Not on file  . Transportation needs:    Medical: Not on file    Non-medical: Not on file  Tobacco Use  . Smoking status: Former Smoker    Last attempt to quit: 08/29/1999    Years since quitting: 19.1  . Smokeless tobacco: Never Used  . Tobacco comment: couldn't remember date  Substance and Sexual Activity  . Alcohol use: No    Alcohol/week: 0.0 standard drinks  . Drug use: No  . Sexual activity: Not on file  Lifestyle  . Physical activity:    Days per week: Not on file    Minutes per session: Not on file  . Stress: Not on file  Relationships  . Social connections:    Talks on phone: Not on file    Gets together: Not on file    Attends religious service: Not on file    Active member of club or organization: Not on file    Attends meetings of clubs or organizations: Not on file    Relationship status: Not on file  . Intimate partner violence:    Fear of current or ex partner: Not on file    Emotionally abused: Not on file    Physically abused: Not on file    Forced sexual activity: Not on file  Other Topics Concern  . Not on file  Social History Narrative  . Not on file    Review of Systems: Gen: Denies fever, chills, loss of appetite, change in weight or weight loss CV: Denies chest pain, heart palpitations, syncope, edema  Resp: Denies shortness of  breath with rest, cough, wheezing GI: see HPI GU : Denies urinary burning, urinary frequency, urinary incontinence.  MS: Denies joint pain,swelling, cramping Derm: Denies rash, itching, dry skin Psych: Denies depression, anxiety,confusion, or memory loss Heme: see HPI  Physical Exam: Vital signs in last 24 hours: Temp:  [97.8 F (36.6 C)] 97.8 F (36.6 C) (02/04 1128) Pulse Rate:  [90-98] 94 (02/04 1200) Resp:  [14-20] 14 (02/04 1200) BP: (110-128)/(50-84) 128/84 (02/04 1200) SpO2:  [93 %-98 %] 98 % (02/04 1200) Weight:  [124.7 kg] 124.7 kg (02/04 1129)   General:   Alert,  Well-developed, well-nourished, pleasant and cooperative in NAD Head:  Normocephalic and atraumatic. Eyes:  Sclera clear, no icterus.   Conjunctiva pink. Ears:  Normal auditory acuity. Nose:  No deformity, discharge,  or lesions. Mouth:  No deformity or lesions, dentition normal. Lungs:  Clear throughout to auscultation.   Heart:  S1 S2 present, no obvious murmurs  Abdomen:  Soft, obese, TTP diffusely but moreso epigastric, no rebound or guarding. +bowel sounds.  Rectal:  Deferred until time of colonoscopy.   Msk:  Symmetrical without gross deformities. Normal posture. Extremities:  With 2+ pitting lower extremity edema. Neurologic:  Alert and  oriented x4;  grossly normal neurologically. Psych:  Alert and cooperative. Normal mood and affect.  Intake/Output from previous day: No intake/output data recorded. Intake/Output this shift: Total I/O In:  100 [IV Piggyback:100] Out: -   Lab Results: Recent Labs    10/01/18 1213  WBC 13.3*  HGB 9.0*  HCT 27.8*  PLT 289   BMET Recent Labs    10/01/18 1213  NA 139  K 3.4*  CL 98  CO2 30  GLUCOSE 200*  BUN 60*  CREATININE 1.25*  CALCIUM 8.3*   LFT Recent Labs    10/01/18 1213  PROT 6.1*  ALBUMIN 2.9*  AST 19  ALT 11  ALKPHOS 52  BILITOT 0.9     Impression: 68 year old male presenting with upper GI bleed in setting of Eliquis, Plavix,  and intermittent aspirin powders. Notably, he has not had Plavix in the past two days and no Eliquis since at least Sep 25, 2018. No prior EGD. Currently without distress and quite jovial. Abdominal pain improved from admission. Will need diagnostic EGD in the near future with Propofol. I discussed risks and benefits with him with stated understanding. Wife present at bedside.   Plan: Clear liquids now NPO after midnight except for meds Agree with Protonix infusion Follow H/H Transfuse as needed Anticipate EGD with Propofol by Dr. Gala Romney on 10/02/2018 If recurrent abdominal pain and severe, recommend CT   Annitta Needs, PhD, ANP-BC [facility]      LOS: 0 days    10/01/2018, 4:35 PM

## 2018-10-01 NOTE — ED Notes (Signed)
Pt given Ginger ale per MD approval.

## 2018-10-01 NOTE — H&P (Addendum)
History and Physical    [name] [name] WYO:[id number] DOB: 1951/01/29 DOA: 10/01/2018  PCP: Kathyrn Drown, MD   Patient coming from: Home  I have personally briefly reviewed patient's old medical records in [facility]  Chief Complaint: Abdominal pain, Black stools  HPI: [name] [name] is a 68 y.o. male with medical history significant for ischemic cardiomyopathy, CABG x 6, TAVR, paroxysmal A. Fib, DM2, HTN, OSA, who presented to the ED with complaints of generalized abdominal pain over the past 4 weeks.  Yesterday patient noticed black stools scribed as " inky black".  Also one episode of coffee-ground emesis.  Patient has been taking Excedrin for headaches, daily over the past year and a half.  Patient is also on Plavix.  Patient was supposed to start taking Eliquis today paroxysmal atrial fibrillation, but has not had any yet.  ED Course: Stable vitals, blood pressure systolic 774J to 287.  Hemoglobin 9, drop, baseline appears to be 12-15.  Leukocytosis 13.3.  Platelets normal 289.  Gi consulted in ED, patient seen, anticipate EGD in a.m. by Dr. Buford Dresser, Protonix GTT.  Transfuse as needed, clear liquids, n.p.o. midnight except for meds.  Review of Systems: As per HPI all other systems reviewed and negative.  Past Medical History:  Diagnosis Date  . AICD (automatic cardioverter/defibrillator) present 05/18/2016  . Anginal pain ([place])    occ none recent  . Aortic stenosis   . Arthritis   . CAD (coronary artery disease)   . Cardiomyopathy, ischemic   . CHF (congestive heart failure) ([place])   . Chronic back pain   . Diabetes mellitus without complication ([place])   . Elevated PSA   . History of kidney stones   . Hyperlipidemia   . Morbid obesity ([place])   . Myocardial infarction ([place])   . OSA (obstructive sleep apnea)    does not wear CPAP, -order run out told need to redo  test  . Paroxysmal atrial fibrillation ([place]) 09/29/2018   This was found on event monitor October 2019  . S/P  CABG x 6 12/23/1999   LIMA to LAD, SVG to ramus, sequential SVG to OM2-RPL, sequential SVG to AM-PDA  . 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 APPENDECTOMY    . CARDIAC CATHETERIZATION     stents  . CAROTID STENT     denies carotid stent  . CATARACT EXTRACTION, BILATERAL    . CORONARY ARTERY BYPASS GRAFT  12/23/1999   LIMA to LAD, SVG to ramus, sequential SVG to OM2-RPL, sequential SVG to AM-PDA  . EP IMPLANTABLE DEVICE N/A 05/18/2016   Procedure: ICD Implant;  Surgeon: Will Meredith Leeds, MD;  Location: [place] [facility];  Service: Cardiovascular;  Laterality: N/A;  . EYE SURGERY    . ingrown toenail N/A   . LEFT HEART CATHETERIZATION WITH CORONARY/GRAFT ANGIOGRAM N/A 12/21/2014   Procedure: LEFT HEART CATHETERIZATION WITH Beatrix Fetters;  Surgeon: Belva Crome, MD;  [facility] C[facility];  Service: Cardiovascular;  Laterality: N/A;  . NASAL SEPTOPLASTY W/ TURBINOPLASTY Bilateral 04/25/2017   NASAL SEPTOPLASTY WITH TURBINATE REDUCTION/notes 04/25/2017  . NASAL SEPTOPLASTY W/ TURBINOPLASTY Bilateral 04/25/2017   Procedure: NASAL SEPTOPLASTY WITH TURBINATE REDUCTION;  Surgeon: Leta Baptist, MD;  Location: [place];  Service: ENT;  Laterality: Bilateral;  . RIGHT/LEFT HEART CATH AND CORONARY/GRAFT ANGIOGRAPHY N/A  10/05/2017   Procedure: RIGHT/LEFT HEART CATH AND CORONARY/GRAFT ANGIOGRAPHY;  Surgeon: Sherren Mocha, MD;  Location: [place] [facility];  Service: Cardiovascular;  Laterality: N/A;  . TEE WITHOUT CARDIOVERSION N/A 11/20/2017   Procedure: TRANSESOPHAGEAL ECHOCARDIOGRAM (TEE);  Surgeon: Sherren Mocha, MD;  Location: [place];  Service: Open Heart Surgery;  Laterality: N/A;  . TONSILLECTOMY    . TRANSCATHETER AORTIC VALVE REPLACEMENT, TRANSFEMORAL N/A 11/20/2017   Procedure: TRANSCATHETER AORTIC VALVE  REPLACEMENT, TRANSFEMORAL;  Surgeon: Sherren Mocha, MD;  Location: [place];  Service: Open Heart Surgery;  Laterality: N/A;     reports that he quit smoking about 19 years ago. He has never used smokeless tobacco. He reports that he does not drink alcohol or use drugs.  Allergies  Allergen Reactions  . Dyflex-G [Dyphylline-Guaifenesin] Hives  . Eliquis [Apixaban] Other (See Comments)    Side effects not a true allergy- he stated that it made him feel bad    Family History  Adopted: Yes  Problem Relation Age of Onset  . Diabetes Mother   . CAD Mother   . Colon cancer Neg Hx   . Colon polyps Neg Hx     Prior to Admission medications   Medication Sig Start Date End Date Taking? Authorizing Provider  carvedilol (COREG) 6.25 MG tablet Take 1 tablet (6.25 mg total) by mouth 2 (two) times daily. 11/28/17  Yes Weaver, Scott T, PA-C  clopidogrel (PLAVIX) 75 MG tablet TAKE ONE TABLET BY MOUTH DAILY. 07/15/18  Yes Josue Hector, MD  HYDROcodone-acetaminophen [facility]) 10-325 MG tablet One bid prn pain 07/01/18  Yes Luking, Scott A, MD  LEVEMIR FLEXTOUCH 100 UNIT/ML Pen INJECT 60 UNITS INTO THE SKIN EACH EVENING. MAY TITRATE UP TO 80 UNITS. 08/27/18  Yes Kathyrn Drown, MD  liver oil-zinc oxide (DESITIN) 40 % ointment Apply 1 application topically as needed for irritation.   Yes [provider]  metolazone (ZAROXOLYN) 2.5 MG tablet TAKE ONE TABLET BY MOUTH DAILY. 09/02/18  Yes BranchAlphonse Guild, MD  Multiple Vitamins-Minerals (CENTRUM SILVER PO) Take 2 tablets by mouth daily.    Yes [provider]  nitroGLYCERIN (NITROSTAT) 0.4 MG SL tablet PLACE 1 TABLET UNDER TONGUE FOR CHEST PAIN. MAY REPEAT EVERY 5 MIN UPTO 3 DOSES-NO RELIEF,CALL 911. 09/18/18  Yes Sherren Mocha, MD  NOVOLOG FLEXPEN 100 UNIT/ML FlexPen INJECT 20 UNITS INTO THE SKIN THREE TIMES DAILY WITH MEALS. 08/08/18  Yes Kathyrn Drown, MD  polycarbophil (FIBERCON) 625 MG tablet Take 2 tablets (1,250 mg total) by mouth  daily with lunch. Hold if you have diarrhea 09/18/16  Yes Rosita Fire, MD  potassium chloride SA (KLOR-CON M20) 20 MEQ tablet Take 2 tablets (40 mEq total) by mouth 3 (three) times daily. 05/20/18 10/01/18 Yes BranchAlphonse Guild, MD  Rivaroxaban (XARELTO) 15 MG TABS tablet Take one tablet by mouth once daily 10/01/18  Yes Luking, Scott A, MD  rosuvastatin (CRESTOR) 40 MG tablet Take 1 tablet (40 mg total) by mouth daily. 07/01/18  Yes Kathyrn Drown, MD  torsemide (DEMADEX) 20 MG tablet 2 qam and 2 q 3pm 08/30/18  Yes Luking, Elayne Snare, MD  traMADol (ULTRAM) 50 MG tablet Take one po Q 6-8 prn headache. 09/26/18  Yes Mikey Kirschner, MD  trolamine salicylate (ASPERCREME) 10 % cream Apply 1 application topically 2 (two) times daily as needed for muscle pain.    Yes [provider]  amoxicillin (AMOXIL) 500 MG tablet Take 4 tablets (2,000 mg total) by  mouth as directed. Patient not taking: Reported on 10/01/2018 01/04/18   Sherren Mocha, MD  amoxicillin (AMOXIL) 500 MG tablet Take 1 tablet (500 mg total) by mouth 3 (three) times daily. Patient not taking: Reported on 10/01/2018 09/25/18   Kathyrn Drown, MD  spironolactone (ALDACTONE) 25 MG tablet Take 1 tablet (25 mg total) by mouth daily. 11/30/17 09/25/18  Richardson Dopp T, PA-C  SURE COMFORT PEN NEEDLES 31G X 8 MM MISC USING 4 TIMES DAILY. 11/05/17   Kathyrn Drown, MD    Physical Exam: Vitals:   10/01/18 1129 10/01/18 1130 10/01/18 1145 10/01/18 1200  BP:  (!) 115/56  128/84  Pulse:  98 90 94  Resp:    14  Temp:      TempSrc:      SpO2:  96% 95% 98%  Weight: 124.7 kg     Height: 5\' 10"  (1.778 m)       Constitutional: calm, complaining of abdominal pain Vitals:   10/01/18 1129 10/01/18 1130 10/01/18 1145 10/01/18 1200  BP:  (!) 115/56  128/84  Pulse:  98 90 94  Resp:    14  Temp:      TempSrc:      SpO2:  96% 95% 98%  Weight: 124.7 kg     Height: 5\' 10"  (1.778 m)      Eyes: PERRL, lids and conjunctivae normal ENMT: Mucous  membranes are moist. Posterior pharynx clear of any exudate or lesions. Neck: normal, supple, no masses, no thyromegaly Respiratory: clear to auscultation bilaterally, no wheezing, no crackles. Normal respiratory effort. No accessory muscle use.  Cardiovascular: Regular rate and rhythm, no murmurs / rubs / gallops. Trace bilat pitting chronic. 2+ pedal pulses. No carotid bruits.  Abdomen: no tenderness, no masses palpated. No hepatosplenomegaly. Bowel sounds positive.  Musculoskeletal: no clubbing / cyanosis. No joint deformity upper and lower extremities. Good ROM, no contractures. Normal muscle tone.  Skin: no rashes, lesions, ulcers. No induration Neurologic: CN 2-12 grossly intact.  Strength 5/5 in all 4.  Psychiatric: Normal judgment and insight. Alert and oriented x 3. Normal mood.   Labs on Admission: I have personally reviewed following labs and imaging studies  CBC: Recent Labs  Lab 10/01/18 1213  WBC 13.3*  NEUTROABS 10.8*  HGB 9.0*  HCT 27.8*  MCV 98.2  PLT 106   Basic Metabolic Panel: Recent Labs  Lab 10/01/18 1213  NA 139  K 3.4*  CL 98  CO2 30  GLUCOSE 200*  BUN 60*  CREATININE 1.25*  CALCIUM 8.3*   Liver Function Tests: Recent Labs  Lab 10/01/18 1213  AST 19  ALT 11  ALKPHOS 52  BILITOT 0.9  PROT 6.1*  ALBUMIN 2.9*   Urine analysis:    Component Value Date/Time   COLORURINE YELLOW 10/01/2018 [place] 10/01/2018 1146   LABSPEC 1.013 10/01/2018 1146   PHURINE 7.0 10/01/2018 1146   GLUCOSEU NEGATIVE 10/01/2018 1146   HGBUR NEGATIVE 10/01/2018 [place] 10/01/2018 1146   [place] 10/01/2018 1146   PROTEINUR NEGATIVE 10/01/2018 1146   NITRITE NEGATIVE 10/01/2018 1146   LEUKOCYTESUR NEGATIVE 10/01/2018 1146    Radiological Exams on Admission: No results found.  EKG: None  Assessment/Plan Active Problems:   GI bleed   GI bleed-appears upper, NSAID use, on Plavix.  He is on anticoagulation.  hemoglobin drop now 8 from baseline 12-15. - GI consulted, anticipate EGD in a.m, buy Dr. Buford Dresser - Clear liquid diet, n.p.o.  midnight - Protonix bolus given, continue Protonix GTT -Transfuse 1 unit, PRBC, 20mg  lasix x1 - CBC Q8h - Addendum-arrival to floor, patient complained of chest pain, rate 150s, blood pressure stable 150s.  Stat EKG old left bundle branch block, sinus tachycardia.  IV metoprolol 5 x 1, morphine 4 mg x 1, PRN SL nitro, Trend troponins x3.  Chest pain likely induced by anemia.  Ischemic cardiomyopathy, CAD- CABG x 5.  Stable without chest pain.  Echo 12/2017 EF 15 to 20%, diffuse hypokinesis, G2DD.  Trace bilateral pitting pedal edema at this time.  Chronic. -Hold home Coreg, spironolactone -Home diuretics torsemide, metolazone, in the setting of GI bleed -Cautious fluids and transfusions with significantly reduced EF  Paroxysmal  A. fib , TAVR-rate controlled, intermittent tachycardia.  Was supposed to start anticoagulation today for A. Fib. -Hold Coreg, Xarelto  HTN-stable. -Hold Coreg and diuretics  OSA-  -CPAP QHS  DM2-random glucose 200. Hgba1c 1/20- 7.5.  -Hold home Levemir - SSI   DVT prophylaxis: Scds Code Status: Full Family Communication: Spouse at bedside Disposition Plan: Per rounding team Consults called: GI Admission status: Obs, tele   [name] Roys MD [facility]  10/01/2018, 8:16 PM

## 2018-10-01 NOTE — ED Triage Notes (Signed)
Patient complaining of abdominal pain x 1 month with black stools starting yesterday and vomiting black emesis.

## 2018-10-01 NOTE — Progress Notes (Signed)
Patient complaint of chest pain that radiates to left arm. Paged MD. MD ordered a STAT 12 lead EKG. Patient manual blood pressure is 156/98. Pulse is 146. Patient rates chest pain at 4/10. Will continue to monitor throughout shift.

## 2018-10-01 NOTE — Telephone Encounter (Signed)
FYI - Pt's wife called states he is having severe abdominal pain, dark vomit, a lot of dark stools, diarrhea, weakness and sob when standing. Advised wife he should go directly to ED. She states she will take him to [facility] ED.

## 2018-10-01 NOTE — ED Provider Notes (Addendum)
[facility] EMERGENCY DEPARTMENT Provider Note   CSN: [id number] Arrival date & time: 10/01/18  1114     History   Chief Complaint Chief Complaint  Patient presents with  . Abdominal Pain    HPI [name] [name] is a 68 y.o. male.  HPI   68 year old male with abdominal pain, black stool and hematemesis.  He reports about 4-week history of vague abdominal pain.  Worsening within the past 2 days.  Yesterday began having black stool.  "It looked like black ink."  He has been nauseated.  Today he vomited black material.  He has felt dizzy/lightheaded with standing.  Abdominal pain is in the epigastrium.  Worse with certain positions.  He is on Plavix.  He should be anticoagulated based on cardiac risk factors, but fortunately it sounds like this has not actually been started yet though.  Up until less than a week ago he had been taking Excedrin for headaches.  "On average 2 tablets daily for about a month."   Past Medical History:  Diagnosis Date  . AICD (automatic cardioverter/defibrillator) present 05/18/2016  . Anginal pain ([place])    occ none recent  . Aortic stenosis   . Arthritis   . CAD (coronary artery disease)   . Cardiomyopathy, ischemic   . CHF (congestive heart failure) ([place])   . Chronic back pain   . Diabetes mellitus without complication ([place])   . Elevated PSA   . History of kidney stones   . Hyperlipidemia   . Morbid obesity ([place])   . Myocardial infarction ([place])   . OSA (obstructive sleep apnea)    does not wear CPAP, -order run out told need to redo  test  . Paroxysmal atrial fibrillation ([place]) 09/29/2018   This was found on event monitor October 2019  . 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 Paroxysmal atrial fibrillation ([place]) 09/29/2018  . 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 Aortic stenosis   . S/P nasal septoplasty 04/25/2017  . AICD (automatic cardioverter/defibrillator) present 11/22/2016  . CKD (chronic kidney disease) stage 3, GFR 30-59 ml/min (HCC)   . Acute kidney injury ([place])   . Difficulty in walking, not elsewhere classified   . Pain in the abdomen   . Anasarca   . Constipation   . Dyspnea 09/11/2016  . Prolonged Q-T interval on ECG 09/11/2016  . Hypokalemia 09/11/2016  . Chest pain 10/22/2015  . Diabetes mellitus, type 2 ([place]) 10/22/2015  . Essential hypertension   . Chronic lumbar pain 01/01/2015  . Obstructive sleep apnea 10/02/2014  . Cardiomyopathy, ischemic 06/25/2014  . Chronic combined systolic and diastolic CHF (congestive heart failure) ([place]) 10/29/2013  . Right ankle pain 08/14/2013  . Hyperlipemia 05/14/2013  . CAD (coronary artery disease) 05/14/2013  . Morbid obesity ([place]) 05/14/2013  . Elevated PSA 05/14/2013  . 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 APPENDECTOMY    . CARDIAC CATHETERIZATION     stents  . CAROTID STENT     denies carotid stent  . CATARACT EXTRACTION, BILATERAL    . CORONARY ARTERY BYPASS GRAFT  12/23/1999   LIMA  to LAD, SVG to ramus, sequential SVG to OM2-RPL, sequential SVG to AM-PDA  . EP IMPLANTABLE DEVICE N/A 05/18/2016   Procedure: ICD Implant;  Surgeon: Will Meredith Leeds, MD;  Location: [place] CV LAB;  Service: Cardiovascular;  Laterality: N/A;  . EYE SURGERY    . ingrown toenail N/A   . LEFT HEART CATHETERIZATION WITH CORONARY/GRAFT ANGIOGRAM N/A 12/21/2014   Procedure: LEFT HEART CATHETERIZATION WITH Beatrix Fetters;  Surgeon: Belva Crome, MD;  Location: [facility] CATH LAB;  Service: Cardiovascular;  Laterality: N/A;  . NASAL SEPTOPLASTY W/ TURBINOPLASTY Bilateral 04/25/2017   NASAL SEPTOPLASTY WITH  TURBINATE REDUCTION/notes 04/25/2017  . NASAL SEPTOPLASTY W/ TURBINOPLASTY Bilateral 04/25/2017   Procedure: NASAL SEPTOPLASTY WITH TURBINATE REDUCTION;  Surgeon: Leta Baptist, MD;  Location: [place];  Service: ENT;  Laterality: Bilateral;  . RIGHT/LEFT HEART CATH AND CORONARY/GRAFT ANGIOGRAPHY N/A 10/05/2017   Procedure: RIGHT/LEFT HEART CATH AND CORONARY/GRAFT ANGIOGRAPHY;  Surgeon: Sherren Mocha, MD;  Location: [place] CV LAB;  Service: Cardiovascular;  Laterality: N/A;  . TEE WITHOUT CARDIOVERSION N/A 11/20/2017   Procedure: TRANSESOPHAGEAL ECHOCARDIOGRAM (TEE);  Surgeon: Sherren Mocha, MD;  Location: [place];  Service: Open Heart Surgery;  Laterality: N/A;  . TONSILLECTOMY    . TRANSCATHETER AORTIC VALVE REPLACEMENT, TRANSFEMORAL N/A 11/20/2017   Procedure: TRANSCATHETER AORTIC VALVE REPLACEMENT, TRANSFEMORAL;  Surgeon: Sherren Mocha, MD;  Location: [place];  Service: Open Heart Surgery;  Laterality: N/A;        Home Medications    Prior to Admission medications   Medication Sig Start Date End Date Taking? Authorizing Provider  carvedilol (COREG) 6.25 MG tablet Take 1 tablet (6.25 mg total) by mouth 2 (two) times daily. 11/28/17  Yes Weaver, Scott T, PA-C  clopidogrel (PLAVIX) 75 MG tablet TAKE ONE TABLET BY MOUTH DAILY. 07/15/18  Yes Josue Hector, MD  HYDROcodone-acetaminophen [facility]) 10-325 MG tablet One bid prn pain 07/01/18  Yes Luking, Scott A, MD  LEVEMIR FLEXTOUCH 100 UNIT/ML Pen INJECT 60 UNITS INTO THE SKIN EACH EVENING. MAY TITRATE UP TO 80 UNITS. 08/27/18  Yes Kathyrn Drown, MD  liver oil-zinc oxide (DESITIN) 40 % ointment Apply 1 application topically as needed for irritation.   Yes [provider]  metolazone (ZAROXOLYN) 2.5 MG tablet TAKE ONE TABLET BY MOUTH DAILY. 09/02/18  Yes BranchAlphonse Guild, MD  Multiple Vitamins-Minerals (CENTRUM SILVER PO) Take 2 tablets by mouth daily.    Yes [provider]  nitroGLYCERIN (NITROSTAT) 0.4 MG SL tablet PLACE 1 TABLET  UNDER TONGUE FOR CHEST PAIN. MAY REPEAT EVERY 5 MIN UPTO 3 DOSES-NO RELIEF,CALL 911. 09/18/18  Yes Sherren Mocha, MD  NOVOLOG FLEXPEN 100 UNIT/ML FlexPen INJECT 20 UNITS INTO THE SKIN THREE TIMES DAILY WITH MEALS. 08/08/18  Yes Kathyrn Drown, MD  polycarbophil (FIBERCON) 625 MG tablet Take 2 tablets (1,250 mg total) by mouth daily with lunch. Hold if you have diarrhea 09/18/16  Yes Rosita Fire, MD  potassium chloride SA (KLOR-CON M20) 20 MEQ tablet Take 2 tablets (40 mEq total) by mouth 3 (three) times daily. 05/20/18 10/01/18 Yes BranchAlphonse Guild, MD  Rivaroxaban (XARELTO) 15 MG TABS tablet Take one tablet by mouth once daily 10/01/18  Yes Luking, Scott A, MD  rosuvastatin (CRESTOR) 40 MG tablet Take 1 tablet (40 mg total) by mouth daily. 07/01/18  Yes Kathyrn Drown, MD  torsemide (DEMADEX) 20 MG tablet 2 qam and 2 q 3pm 08/30/18  Yes Luking, Elayne Snare, MD  traMADol (ULTRAM) 50 MG tablet  Take one po Q 6-8 prn headache. 09/26/18  Yes Mikey Kirschner, MD  trolamine salicylate (ASPERCREME) 10 % cream Apply 1 application topically 2 (two) times daily as needed for muscle pain.    Yes [provider]  amoxicillin (AMOXIL) 500 MG tablet Take 4 tablets (2,000 mg total) by mouth as directed. Patient not taking: Reported on 10/01/2018 01/04/18   Sherren Mocha, MD  amoxicillin (AMOXIL) 500 MG tablet Take 1 tablet (500 mg total) by mouth 3 (three) times daily. Patient not taking: Reported on 10/01/2018 09/25/18   Kathyrn Drown, MD  spironolactone (ALDACTONE) 25 MG tablet Take 1 tablet (25 mg total) by mouth daily. 11/30/17 09/25/18  Richardson Dopp T, PA-C  SURE COMFORT PEN NEEDLES 31G X 8 MM MISC USING 4 TIMES DAILY. 11/05/17   Kathyrn Drown, MD    Family History Family History  Adopted: Yes  Problem Relation Age of Onset  . Diabetes Mother   . CAD Mother     Social History Social History   Tobacco Use  . Smoking status: Former Smoker    Last attempt to quit: 08/29/1999    Years since  quitting: 19.1  . Smokeless tobacco: Never Used  . Tobacco comment: couldn't remember date  Substance Use Topics  . Alcohol use: No    Alcohol/week: 0.0 standard drinks  . Drug use: No     Allergies   Dyflex-g [dyphylline-guaifenesin] and Eliquis [apixaban]   Review of Systems Review of Systems All systems reviewed and negative, other than as noted in HPI.  Physical Exam Updated Vital Signs BP 128/84   Pulse 94   Temp 97.8 F (36.6 C) (Oral)   Resp 14   Ht 5\' 10"  (1.778 m)   Wt 124.7 kg   SpO2 98%   BMI 39.46 kg/m   Physical Exam Vitals signs and nursing note reviewed.  Constitutional:      General: He is not in acute distress.    Appearance: He is well-developed.  HENT:     Head: Normocephalic and atraumatic.  Eyes:     General:        Right eye: No discharge.        Left eye: No discharge.     Conjunctiva/sclera: Conjunctivae normal.  Neck:     Musculoskeletal: Neck supple.  Cardiovascular:     Rate and Rhythm: Normal rate and regular rhythm.     Heart sounds: Normal heart sounds. No murmur. No friction rub. No gallop.   Pulmonary:     Effort: Pulmonary effort is normal. No respiratory distress.     Breath sounds: Normal breath sounds.  Abdominal:     General: There is no distension.     Palpations: Abdomen is soft.     Tenderness: There is abdominal tenderness in the epigastric area. There is no guarding or rebound.  Genitourinary:    Comments: Gross blood, maroon in appearance.  Musculoskeletal:        General: No tenderness.  Skin:    General: Skin is warm and dry.  Neurological:     Mental Status: He is alert.  Psychiatric:        Behavior: Behavior normal.        Thought Content: Thought content normal.      ED Treatments / Results  Labs (all labs ordered are listed, but only abnormal results are displayed) Labs Reviewed  CBC WITH DIFFERENTIAL/PLATELET - Abnormal; Notable for the following components:      Result  Value   WBC 13.3 (*)     RBC 2.83 (*)    Hemoglobin 9.0 (*)    HCT 27.8 (*)    RDW 17.1 (*)    Neutro Abs 10.8 (*)    Monocytes Absolute 1.4 (*)    Abs Immature Granulocytes 0.14 (*)    All other components within normal limits  COMPREHENSIVE METABOLIC PANEL - Abnormal; Notable for the following components:   Potassium 3.4 (*)    Glucose, Bld 200 (*)    BUN 60 (*)    Creatinine, Ser 1.25 (*)    Calcium 8.3 (*)    Total Protein 6.1 (*)    Albumin 2.9 (*)    GFR calc non Af Amer 59 (*)    All other components within normal limits  POC OCCULT BLOOD, ED - Abnormal; Notable for the following components:   Fecal Occult Bld POSITIVE (*)    All other components within normal limits  URINALYSIS, ROUTINE W REFLEX MICROSCOPIC  OCCULT BLOOD X 1 CARD TO LAB, STOOL  TYPE AND SCREEN    EKG None  Radiology No results found.  Procedures Procedures (including critical care time)  CRITICAL CARE Performed by: Virgel Manifold Total critical care time:35 minutes Critical care time was exclusive of separately billable procedures and treating other patients. Critical care was necessary to treat or prevent imminent or life-threatening deterioration. Critical care was time spent personally by me on the following activities: development of treatment plan with patient and/or surrogate as well as nursing, discussions with consultants, evaluation of patient's response to treatment, examination of patient, obtaining history from patient or surrogate, ordering and performing treatments and interventions, ordering and review of laboratory studies, ordering and review of radiographic studies, pulse oximetry and re-evaluation of patient's condition.  Medications Ordered in ED Medications  0.9 %  sodium chloride infusion ( Intravenous New Bag/Given 10/01/18 1304)  pantoprazole (PROTONIX) 80 mg in sodium chloride 0.9 % 250 mL (0.32 mg/mL) infusion (8 mg/hr Intravenous New Bag/Given 10/01/18 1339)  pantoprazole (PROTONIX) 80 mg in  sodium chloride 0.9 % 100 mL IVPB (0 mg Intravenous Stopped 10/01/18 1340)     Initial Impression / Assessment and Plan / ED Course  I have reviewed the triage vital signs and the nursing notes.  Pertinent labs & imaging results that were available during my care of the patient were reviewed by me and considered in my medical decision making (see chart for details).     68 year old male with an upper GI bleed.  Epigastric abdominal pain.  Recent NSAID usage.  [place]oon blood noted on exam.  BUN 60 with a creatinine of 1.25.  Hemoglbin of 9 with last lab value in the normal range. Asymptomatic at rest. HR in 90s. BP somewhat soft but coming up with gentle IVF. Last ECHO with EF of 35-45% and diastolic dysfunction. He is on plavix. Did not start xarelto yet. He has two IVs. Protonix bolus and drip. Type and screen. Discussed with Dr Oneida Alar, GI. Will see in consultation. Clear liquids up until midnight. Will discuss with medicine for admission.      Final Clinical Impressions(s) / ED Diagnoses   Final diagnoses:  Acute upper GI bleed  Acute blood loss anemia    ED Discharge Orders    None       Virgel Manifold, MD 10/01/18 6256    Virgel Manifold, MD 10/12/18 1747

## 2018-10-02 ENCOUNTER — Observation Stay (HOSPITAL_COMMUNITY): Payer: Medicare Other | Admitting: Anesthesiology

## 2018-10-02 ENCOUNTER — Encounter (HOSPITAL_COMMUNITY): Payer: Self-pay | Admitting: Anesthesiology

## 2018-10-02 ENCOUNTER — Encounter (HOSPITAL_COMMUNITY)
Admission: EM | Disposition: A | Discharge status: Home / Self Care | Payer: Self-pay | Source: Home / Self Care | Attending: Internal Medicine

## 2018-10-02 DIAGNOSIS — E661 Drug-induced obesity: Secondary | ICD-10-CM | POA: Diagnosis not present

## 2018-10-02 DIAGNOSIS — K259 Gastric ulcer, unspecified as acute or chronic, without hemorrhage or perforation: Secondary | ICD-10-CM | POA: Diagnosis not present

## 2018-10-02 DIAGNOSIS — I480 Paroxysmal atrial fibrillation: Secondary | ICD-10-CM | POA: Diagnosis not present

## 2018-10-02 DIAGNOSIS — Z6839 Body mass index (BMI) 39.0-39.9, adult: Secondary | ICD-10-CM | POA: Diagnosis not present

## 2018-10-02 DIAGNOSIS — K2961 Other gastritis with bleeding: Secondary | ICD-10-CM | POA: Diagnosis present

## 2018-10-02 DIAGNOSIS — E785 Hyperlipidemia, unspecified: Secondary | ICD-10-CM | POA: Diagnosis not present

## 2018-10-02 DIAGNOSIS — K3189 Other diseases of stomach and duodenum: Secondary | ICD-10-CM | POA: Diagnosis not present

## 2018-10-02 DIAGNOSIS — I5022 Chronic systolic (congestive) heart failure: Secondary | ICD-10-CM | POA: Diagnosis not present

## 2018-10-02 DIAGNOSIS — Z95810 Presence of automatic (implantable) cardiac defibrillator: Secondary | ICD-10-CM | POA: Diagnosis not present

## 2018-10-02 DIAGNOSIS — K922 Gastrointestinal hemorrhage, unspecified: Secondary | ICD-10-CM

## 2018-10-02 DIAGNOSIS — Z8249 Family history of ischemic heart disease and other diseases of the circulatory system: Secondary | ICD-10-CM | POA: Diagnosis not present

## 2018-10-02 DIAGNOSIS — K222 Esophageal obstruction: Secondary | ICD-10-CM

## 2018-10-02 DIAGNOSIS — I10 Essential (primary) hypertension: Secondary | ICD-10-CM | POA: Diagnosis not present

## 2018-10-02 DIAGNOSIS — Z951 Presence of aortocoronary bypass graft: Secondary | ICD-10-CM | POA: Diagnosis not present

## 2018-10-02 DIAGNOSIS — G4733 Obstructive sleep apnea (adult) (pediatric): Secondary | ICD-10-CM

## 2018-10-02 DIAGNOSIS — I255 Ischemic cardiomyopathy: Secondary | ICD-10-CM

## 2018-10-02 DIAGNOSIS — E1122 Type 2 diabetes mellitus with diabetic chronic kidney disease: Secondary | ICD-10-CM | POA: Diagnosis not present

## 2018-10-02 DIAGNOSIS — Z87891 Personal history of nicotine dependence: Secondary | ICD-10-CM | POA: Diagnosis not present

## 2018-10-02 DIAGNOSIS — D62 Acute posthemorrhagic anemia: Secondary | ICD-10-CM | POA: Diagnosis not present

## 2018-10-02 DIAGNOSIS — E1121 Type 2 diabetes mellitus with diabetic nephropathy: Secondary | ICD-10-CM | POA: Diagnosis not present

## 2018-10-02 DIAGNOSIS — Z833 Family history of diabetes mellitus: Secondary | ICD-10-CM | POA: Diagnosis not present

## 2018-10-02 DIAGNOSIS — K920 Hematemesis: Secondary | ICD-10-CM | POA: Diagnosis not present

## 2018-10-02 DIAGNOSIS — I130 Hypertensive heart and chronic kidney disease with heart failure and stage 1 through stage 4 chronic kidney disease, or unspecified chronic kidney disease: Secondary | ICD-10-CM | POA: Diagnosis not present

## 2018-10-02 DIAGNOSIS — Z953 Presence of xenogenic heart valve: Secondary | ICD-10-CM | POA: Diagnosis not present

## 2018-10-02 DIAGNOSIS — Z7901 Long term (current) use of anticoagulants: Secondary | ICD-10-CM | POA: Diagnosis not present

## 2018-10-02 DIAGNOSIS — K5900 Constipation, unspecified: Secondary | ICD-10-CM | POA: Diagnosis not present

## 2018-10-02 DIAGNOSIS — K250 Acute gastric ulcer with hemorrhage: Secondary | ICD-10-CM | POA: Diagnosis not present

## 2018-10-02 DIAGNOSIS — Z7902 Long term (current) use of antithrombotics/antiplatelets: Secondary | ICD-10-CM | POA: Diagnosis not present

## 2018-10-02 DIAGNOSIS — Z79899 Other long term (current) drug therapy: Secondary | ICD-10-CM | POA: Diagnosis not present

## 2018-10-02 DIAGNOSIS — I252 Old myocardial infarction: Secondary | ICD-10-CM | POA: Diagnosis not present

## 2018-10-02 DIAGNOSIS — I248 Other forms of acute ischemic heart disease: Secondary | ICD-10-CM | POA: Diagnosis present

## 2018-10-02 DIAGNOSIS — K449 Diaphragmatic hernia without obstruction or gangrene: Secondary | ICD-10-CM | POA: Diagnosis not present

## 2018-10-02 DIAGNOSIS — N182 Chronic kidney disease, stage 2 (mild): Secondary | ICD-10-CM | POA: Diagnosis not present

## 2018-10-02 DIAGNOSIS — I2510 Atherosclerotic heart disease of native coronary artery without angina pectoris: Secondary | ICD-10-CM | POA: Diagnosis not present

## 2018-10-02 HISTORY — PX: BIOPSY: SHX5522

## 2018-10-02 HISTORY — PX: ESOPHAGOGASTRODUODENOSCOPY (EGD) WITH PROPOFOL: SHX5813

## 2018-10-02 LAB — CBC
HCT: 27.4 % — ABNORMAL LOW (ref 39.0–52.0)
HCT: 27.5 % — ABNORMAL LOW (ref 39.0–52.0)
HEMOGLOBIN: 9 g/dL — AB (ref 13.0–17.0)
Hemoglobin: 8.7 g/dL — ABNORMAL LOW (ref 13.0–17.0)
MCH: 31.7 pg (ref 26.0–34.0)
MCH: 31 pg (ref 26.0–34.0)
MCHC: 32.8 g/dL (ref 30.0–36.0)
MCHC: 31.6 g/dL (ref 30.0–36.0)
MCV: 96.5 fL (ref 80.0–100.0)
MCV: 97.9 fL (ref 80.0–100.0)
Platelets: 242 10*3/uL (ref 150–400)
Platelets: 230 10*3/uL (ref 150–400)
RBC: 2.84 MIL/uL — ABNORMAL LOW (ref 4.22–5.81)
RBC: 2.81 MIL/uL — ABNORMAL LOW (ref 4.22–5.81)
RDW: 17.6 % — ABNORMAL HIGH (ref 11.5–15.5)
RDW: 18.3 % — ABNORMAL HIGH (ref 11.5–15.5)
WBC: 11.9 10*3/uL — ABNORMAL HIGH (ref 4.0–10.5)
WBC: 9.8 10*3/uL (ref 4.0–10.5)
nRBC: 0.2 % (ref 0.0–0.2)
nRBC: 0 % (ref 0.0–0.2)

## 2018-10-02 LAB — TROPONIN I
TROPONIN I: 0.19 ng/mL — AB (ref <0.03)
Troponin I: 0.34 ng/mL (ref <0.03)
Troponin I: 0.25 ng/mL (ref <0.03)

## 2018-10-02 LAB — BASIC METABOLIC PANEL
Anion gap: 10 (ref 5–15)
BUN: 43 mg/dL — ABNORMAL HIGH (ref 8–23)
CO2: 25 mmol/L (ref 22–32)
Calcium: 7.7 mg/dL — ABNORMAL LOW (ref 8.9–10.3)
Chloride: 105 mmol/L (ref 98–111)
Creatinine, Ser: 1.19 mg/dL (ref 0.61–1.24)
GFR calc Af Amer: >60 mL/min (ref >60)
GFR calc non Af Amer: >60 mL/min (ref >60)
Glucose, Bld: 158 mg/dL — ABNORMAL HIGH (ref 70–99)
Potassium: 3.2 mmol/L — ABNORMAL LOW (ref 3.5–5.1)
Sodium: 140 mmol/L (ref 135–145)

## 2018-10-02 LAB — GLUCOSE, CAPILLARY
Glucose-Capillary: 153 mg/dL — ABNORMAL HIGH (ref 70–99)
Glucose-Capillary: 153 mg/dL — ABNORMAL HIGH (ref 70–99)
Glucose-Capillary: 158 mg/dL — ABNORMAL HIGH (ref 70–99)
Glucose-Capillary: 157 mg/dL — ABNORMAL HIGH (ref 70–99)
Glucose-Capillary: 194 mg/dL — ABNORMAL HIGH (ref 70–99)

## 2018-10-02 LAB — MAGNESIUM: Magnesium: 2.1 mg/dL (ref 1.7–2.4)

## 2018-10-02 LAB — PHOSPHORUS: Phosphorus: 3.1 mg/dL (ref 2.5–4.6)

## 2018-10-02 SURGERY — ESOPHAGOGASTRODUODENOSCOPY (EGD) WITH PROPOFOL
Anesthesia: Monitor Anesthesia Care

## 2018-10-02 MED ORDER — CARVEDILOL 3.125 MG PO TABS
6.2500 mg | ORAL_TABLET | Freq: Two times a day (BID) | ORAL | Status: DC
  Administered 2018-10-02 – 2018-10-04 (×4): 6.25 mg via ORAL
  Filled 2018-10-02 (×4): qty 2

## 2018-10-02 MED ORDER — ETOMIDATE 2 MG/ML IV SOLN
INTRAVENOUS | Status: AC
  Filled 2018-10-02: qty 10

## 2018-10-02 MED ORDER — ETOMIDATE 2 MG/ML IV SOLN
INTRAVENOUS | Status: DC | PRN
  Administered 2018-10-02 (×2): 5 mg via INTRAVENOUS

## 2018-10-02 MED ORDER — POTASSIUM CHLORIDE CRYS ER 20 MEQ PO TBCR
20.0000 meq | EXTENDED_RELEASE_TABLET | Freq: Once | ORAL | Status: AC
  Administered 2018-10-02: 20 meq via ORAL
  Filled 2018-10-02: qty 1

## 2018-10-02 MED ORDER — SPIRONOLACTONE 25 MG PO TABS
25.0000 mg | ORAL_TABLET | Freq: Every day | ORAL | Status: DC
  Administered 2018-10-03 – 2018-10-04 (×2): 25 mg via ORAL
  Filled 2018-10-02 (×2): qty 1

## 2018-10-02 MED ORDER — PROPOFOL 500 MG/50ML IV EMUL
INTRAVENOUS | Status: DC | PRN
  Administered 2018-10-02: 100 ug/kg/min via INTRAVENOUS

## 2018-10-02 MED ORDER — LACTATED RINGERS IV SOLN
INTRAVENOUS | Status: DC
  Administered 2018-10-02: 12:00:00 via INTRAVENOUS

## 2018-10-02 MED ORDER — PROPOFOL 500 MG/50ML IV EMUL: INTRAVENOUS | Status: DC | PRN

## 2018-10-02 MED ORDER — INSULIN DETEMIR 100 UNIT/ML ~~LOC~~ SOLN
20.0000 [IU] | Freq: Every day | SUBCUTANEOUS | Status: DC
  Administered 2018-10-02 – 2018-10-03 (×2): 20 [IU] via SUBCUTANEOUS
  Filled 2018-10-02 (×5): qty 0.2

## 2018-10-02 MED ORDER — PANTOPRAZOLE SODIUM 40 MG IV SOLR
40.0000 mg | Freq: Two times a day (BID) | INTRAVENOUS | Status: DC
  Administered 2018-10-02 – 2018-10-04 (×5): 40 mg via INTRAVENOUS
  Filled 2018-10-02 (×6): qty 40

## 2018-10-02 MED ORDER — SODIUM CHLORIDE 0.9 % IV SOLN: INTRAVENOUS | Status: DC

## 2018-10-02 MED ORDER — PROPOFOL 10 MG/ML IV BOLUS
INTRAVENOUS | Status: DC | PRN
  Administered 2018-10-02: 10 mg via INTRAVENOUS
  Administered 2018-10-02: 20 mg via INTRAVENOUS
  Administered 2018-10-02: 10 mg via INTRAVENOUS

## 2018-10-02 MED ORDER — TORSEMIDE 20 MG PO TABS
20.0000 mg | ORAL_TABLET | Freq: Two times a day (BID) | ORAL | Status: DC
  Administered 2018-10-03 – 2018-10-04 (×3): 20 mg via ORAL
  Filled 2018-10-02 (×3): qty 1

## 2018-10-02 NOTE — Progress Notes (Signed)
PROGRESS NOTE    [name] [name]  TIR:[id number] DOB: [birth year] DOA: 10/01/2018 PCP: Kathyrn Drown, MD     Brief Narrative:  68 y.o. male with medical history significant for ischemic cardiomyopathy, CABG x 6, TAVR, paroxysmal A. Fib, DM2, HTN, OSA, who presented to the ED with complaints of generalized abdominal pain over the past 4 weeks.  Yesterday patient noticed black stools scribed as " inky black".  Also one episode of coffee-ground emesis.  Patient has been taking Excedrin for headaches, daily over the past year and a half.  Patient is also on Plavix. Due to start Eliquis for atrial fibrillation.   Assessment & Plan: 1-Upper GI bleed -due to erosive gastritis and stomach ulcer as seen on EGD -will continue holding NSAID's, plavix and anticoagulation -continue PPI -1 unit of PRBC ordered -follow Hgb trend; most recent one 9.2  2-elevated troponin -in the setting od demand ischemia with anemia and transient episode of A. Fib -rate now controlled -patient denies CPa nd SOB -Hgb > 9.0 -will continue to monitor on telemetry  3-ischemic cardiomyopathy: EF 15-20% -follow daily weights -continue strict intake and output -resume diuretics, b-blocker and spironolactone -heart healthy diet discussed with patient -continue holding plavix  4-PAF/TAVR -holding anticoagulation for another 5 days as per GI rec's -continue monitoring on telemetry -resume b-blocker  5-HTN -stable and well controlled -follow VS -resume home heart/antihypertensive meds  6-OSA -continue CPAP QHS  7-type 2 diabetes -continue SSI -follow CBG's -resume lower dose of levemir  8-obesity class 2 -Body mass index is 39.45 kg/m. -low calorie diet, portion control and increase exercise discussed with patient.  9-HLD -continue statins.   DVT prophylaxis: SCD's Code Status: full code Family Communication: no family at bedside  Disposition Plan: continue protonix, advance diet, follow Hgb  trend, continue holding anticoagulation, monitor on telemetry.  Consultants:   GI service.  Procedures:   EGD 10/02/2018: demonstrated stomach ulcer, erosive gastritis, Schatzki ring and hiatal hernia.   Antimicrobials:  Anti-infectives (From admission, onward)   None       Subjective: No CP, no SOB, reporting no further melanotic stools at this time. Feeling weak and tired. No more nausea or blood emesis described.  Objective: Vitals:   10/02/18 1300 10/02/18 1315 10/02/18 1321 10/02/18 1353  BP: 118/72 100/66  111/84  Pulse: 82 88  92  Resp: 19 19 18 18   Temp:    (!) 97.4 F (36.3 C)  TempSrc:    Oral  SpO2: 99% 99% 99% 94%  Weight:      Height:        Intake/Output Summary (Last 24 hours) at 10/02/2018 2152 Last data filed at 10/02/2018 1235 Gross per 24 hour  Intake 981.39 ml  Output 1600 ml  Net -618.61 ml   Filed Weights   10/01/18 1129 10/01/18 1828  Weight: 124.7 kg 124.7 kg    Examination:  General exam: Alert, awake, oriented x 3; feeling weak, but denying CP and SOB. Respiratory system: Clear to auscultation. Respiratory effort normal. Cardiovascular system:RRR. No murmurs, rubs, gallops. Gastrointestinal system: Abdomen is obese, nondistended, soft and nontender. No organomegaly or masses felt. Normal bowel sounds heard. Central nervous system: Alert and oriented. No focal neurological deficits. Extremities: No C/C/E, +pedal pulses Skin: No rashes, lesions or ulcers Psychiatry: Judgement and insight appear normal. Mood & affect appropriate.    Data Reviewed: I have personally reviewed following labs and imaging studies  CBC: Recent Labs  Lab 10/01/18 1213 10/01/18 1932 10/02/18 6761  10/02/18 1053  WBC 13.3*  --  11.9* 9.8  NEUTROABS 10.8*  --   --   --   HGB 9.0* 8.2* 9.0* 8.7*  HCT 27.8* 26.2* 27.4* 27.5*  MCV 98.2  --  96.5 97.9  PLT 289  --  242 450   Basic Metabolic Panel: Recent Labs  Lab 10/01/18 1213 10/02/18 0504  10/02/18 0508  NA 139 140  --   K 3.4* 3.2*  --   CL 98 105  --   CO2 30 25  --   GLUCOSE 200* 158*  --   BUN 60* 43*  --   CREATININE 1.25* 1.19  --   CALCIUM 8.3* 7.7*  --   MG  --   --  2.1  PHOS  --   --  3.1   GFR: Estimated Creatinine Clearance: 78.7 mL/min (by C-G formula based on SCr of 1.19 mg/dL).   Liver Function Tests: Recent Labs  Lab 10/01/18 1213  AST 19  ALT 11  ALKPHOS 52  BILITOT 0.9  PROT 6.1*  ALBUMIN 2.9*   Cardiac Enzymes: Recent Labs  Lab 10/01/18 2113 10/02/18 0504 10/02/18 1053 10/02/18 1707  TROPONINI <0.03 0.19* 0.34* 0.25*   CBG: Recent Labs  Lab 10/02/18 0808 10/02/18 1103 10/02/18 1251 10/02/18 1622  GLUCAP 153* 153* 158* 157*   Urine analysis:    Component Value Date/Time   COLORURINE YELLOW 10/01/2018 [place] 10/01/2018 1146   LABSPEC 1.013 10/01/2018 1146   PHURINE 7.0 10/01/2018 1146   GLUCOSEU NEGATIVE 10/01/2018 1146   HGBUR NEGATIVE 10/01/2018 [place] 10/01/2018 1146   KETONESUR NEGATIVE 10/01/2018 1146   PROTEINUR NEGATIVE 10/01/2018 1146   NITRITE NEGATIVE 10/01/2018 1146   [place] 10/01/2018 1146      Radiology Studies: No results found.      Scheduled Meds: . pantoprazole (PROTONIX) IV  40 mg Intravenous Q12H  . potassium chloride SA  40 mEq Oral Daily  . rosuvastatin  40 mg Oral q1800   Continuous Infusions:   LOS: 0 days    Time spent: 30 minutes.     Barton Dubois, MD [facility] Pager [phone]   10/02/2018, 9:52 PM

## 2018-10-02 NOTE — Care Management Obs Status (Signed)
[place] NOTIFICATION   Patient Details  Name: JERARDO COSTABILE MRN: [id number] Date of Birth: 1951/04/23   Medicare Observation Status Notification Given:  Yes    Shelda Altes 10/02/2018, 2:45 PM

## 2018-10-02 NOTE — Addendum Note (Signed)
Addendum  created 10/02/18 1358 by Vista Deck, CRNA   Intraprocedure Meds edited

## 2018-10-02 NOTE — Progress Notes (Signed)
Patient seen and examined in short stay.  Denies dysphagia.  Agree with need for diagnostic EGD.  The risks, benefits, limitations, alternatives and imponderables have been reviewed with the patient. Potential for esophageal dilation, biopsy, etc. have also been reviewed.  Questions have been answered. All parties agreeable.

## 2018-10-02 NOTE — Progress Notes (Signed)
Paged mid level with patients current troponin level of 0.25, previous level was 0.34. No new orders at this time. Patient is resting in bed breathing is even and nonlabored. No complaint of chest pain or discomfort at this time. Will continue to monitor throughout shift.

## 2018-10-02 NOTE — Anesthesia Procedure Notes (Signed)
Procedure Name: MAC Date/Time: 10/02/2018 12:15 PM Performed by: Vista Deck, CRNA Pre-anesthesia Checklist: Patient identified, Emergency Drugs available, Suction available, Timeout performed and Patient being monitored Patient Re-evaluated:Patient Re-evaluated prior to induction Oxygen Delivery Method: Non-rebreather mask

## 2018-10-02 NOTE — Anesthesia Preprocedure Evaluation (Signed)
Anesthesia Evaluation  Patient identified by MRN, date of birth, ID band Patient awake    Reviewed: Allergy & Precautions, H&P , NPO status , Patient's Chart, lab work & pertinent test results, reviewed documented beta blocker date and time   Airway Mallampati: III  TM Distance: >3 FB Neck ROM: limited    Dental no notable dental hx. (+) Missing, Poor Dentition   Pulmonary shortness of breath, sleep apnea , former smoker,    Pulmonary exam normal breath sounds clear to auscultation       Cardiovascular Exercise Tolerance: Poor hypertension, + angina + CAD, + Past MI and +CHF  + Cardiac Defibrillator  Rhythm:regular Rate:Normal  5/19 Echo:  EF 15-20%. Moderate   diastolic dysfunction. Mildly dilated RV with moderately   decreased systolic function. Bioprosthetic aortic valve s/p TAVR,   the valve appears to function normally   Neuro/Psych negative neurological ROS  negative psych ROS   GI/Hepatic negative GI ROS, Neg liver ROS,   Endo/Other  negative endocrine ROSdiabetes  Renal/GU Renal disease  negative genitourinary   Musculoskeletal   Abdominal   Peds  Hematology  (+) Blood dyscrasia, anemia ,   Anesthesia Other Findings Left chest AICD palpated  Reproductive/Obstetrics negative OB ROS                             Anesthesia Physical Anesthesia Plan  ASA: IV  Anesthesia Plan: MAC   Post-op Pain Management:    Induction:   PONV Risk Score and Plan:   Airway Management Planned:   Additional Equipment:   Intra-op Plan:   Post-operative Plan:   Informed Consent: I have reviewed the patients History and Physical, chart, labs and discussed the procedure including the risks, benefits and alternatives for the proposed anesthesia with the patient or authorized representative who has indicated his/her understanding and acceptance.     Dental Advisory Given  Plan Discussed  with: CRNA  Anesthesia Plan Comments:         Anesthesia Quick Evaluation

## 2018-10-02 NOTE — Progress Notes (Signed)
Patient has refused to use  CPAP this evening. He has had a sleep study and aware that he needs to be one. He is receiving blood products at this time and I have setup a CPAP for his use. I also set up a 2L [place] for his use.

## 2018-10-02 NOTE — Telephone Encounter (Signed)
Under the care of hospital currently hold off on anticoagulation

## 2018-10-02 NOTE — Anesthesia Procedure Notes (Signed)
Procedure Name: MAC Date/Time: 10/02/2018 12:35 PM Performed by: Vista Deck, CRNA Pre-anesthesia Checklist: Patient identified, Emergency Drugs available, Suction available, Timeout performed and Patient being monitored Patient Re-evaluated:Patient Re-evaluated prior to induction Oxygen Delivery Method: Non-rebreather mask

## 2018-10-02 NOTE — Progress Notes (Signed)
Patient refuses the use of CPAP for tonight. Patient aware to call if he changes his mind.

## 2018-10-02 NOTE — Op Note (Signed)
[facility] Patient Name: [name] [name] Procedure Date: 10/02/2018 12:12 PM MRN: [id number] Date of Birth: [birth year] Attending MD: Norvel Richards , MD CSN: [id number] Age: 68 Admit Type: Outpatient Procedure:                Upper GI endoscopy Indications:              Melena Providers:                Norvel Richards, MD, Gerome Sam, RN, Lurline Del, RN, Randa Spike, Technician Referring MD:              Medicines:                Propofol per Anesthesia Complications:            No immediate complications. Estimated Blood Loss:     Estimated blood loss was minimal. Procedure:                Pre-Anesthesia Assessment:                           - Prior to the procedure, a History and Physical                            was performed, and patient medications and                            allergies were reviewed. The patient's tolerance of                            previous anesthesia was also reviewed. The risks                            and benefits of the procedure and the sedation                            options and risks were discussed with the patient.                            All questions were answered, and informed consent                            was obtained. Prior Anticoagulants: The patient                            last took Xarelto (rivaroxaban) 3 days prior to the                            procedure. ASA Grade Assessment: III - A patient                            with severe systemic disease. After reviewing the  risks and benefits, the patient was deemed in                            satisfactory condition to undergo the procedure.                           After obtaining informed consent, the endoscope was                            passed under direct vision. Throughout the                            procedure, the patient's blood pressure, pulse, and                             oxygen saturations were monitored continuously. The                            [id number]-H190 ([id number]) scope was introduced through the                            and advanced to the second part of duodenum. The                            upper GI endoscopy was accomplished without                            difficulty. The patient tolerated the procedure                            well. Scope In: 12:26:44 PM Scope Out: 12:32:27 PM Total Procedure Duration: 0 hours 5 minutes 43 seconds  Findings:      A mild Schatzki ring was found at the gastroesophageal junction.      A small hiatal hernia was present.      One non-bleeding cratered gastric ulcer with no stigmata of bleeding was       found at the pyloroplasty. The lesion was 12 mm in largest dimension.       Clean base. Surrounding mucosal deformity with numerous 3 to 4 mm       erosions. No obvious infiltrating process.      The duodenal bulb and second portion of the duodenum were normal. Biopsy       of the abnormal antrum taken. Impression:               - Mild Schatzki ring.                           - Small hiatal hernia.                           - Non-bleeding gastric ulcer with no stigmata of                            bleeding.                           -  Erosive gastropathy. Status post gastric biopsy.                           - Normal duodenal bulb and second portion of the                            duodenum. Gastric findings suspicious for occult                            NSAID use. Moderate Sedation:      Moderate (conscious) sedation was personally administered by an       anesthesia professional. The following parameters were monitored: oxygen       saturation, heart rate, blood pressure, respiratory rate, EKG, adequacy       of pulmonary ventilation, and response to care. Recommendation:           - Patient has a contact number available for                            emergencies. The signs and symptoms of potential                             delayed complications were discussed with the                            patient. Return to normal activities tomorrow.                            Written discharge instructions were provided to the                            patient.                           - Return patient to hospital ward for ongoing care.                            Protonix 40 mg twice daily (discussed QT                            prolongation with Annie Main and pharmacy). He felt                            Protonix was safe for this patient. Advance diet.                            Would avoid Plavix and anticoagulation for 5 more                            days. Prefer patient not to be on both Plavix and                            anticoagulation going forward. If anticoagulation  required, would go with aspirin in lieu of of                            Plavix if risk do not exceed benefits. Would also                            make sure patient avoids all other forms of                            NSAIDs/over-the-counter agents.                           I will follow-up on pathology. Patient will need a                            repeat EGD to verify ulcer healing in 3 months Procedure Code(s):        --- Professional ---                           [phone], Esophagogastroduodenoscopy, flexible,                            transoral; diagnostic, including collection of                            specimen(s) by brushing or washing, when performed                            (separate procedure) Diagnosis Code(s):        --- Professional ---                           K22.2, Esophageal obstruction                           K44.9, Diaphragmatic hernia without obstruction or                            gangrene                           K25.9, Gastric ulcer, unspecified as acute or                            chronic, without hemorrhage or perforation                            K31.89, Other diseases of stomach and duodenum                           K92.1, Melena (includes Hematochezia) CPT copyright 2018 American [employer] Association. All rights reserved. The codes documented in this report are preliminary and upon coder review may  be revised to meet current compliance requirements. Cristopher Estimable. Nilan Iddings, MD Norvel Richards, MD 10/02/2018 12:53:11 PM This report has been signed electronically. Number of Addenda: 0

## 2018-10-02 NOTE — Progress Notes (Signed)
CRITICAL VALUE ALERT  Critical Value:  Troponin, 0.19  Date & Time Notied:  10/02/18, 2767  Provider Notified: Olevia Bowens  Orders Received/Actions taken:

## 2018-10-02 NOTE — Anesthesia Postprocedure Evaluation (Signed)
Anesthesia Post Note  Patient: [name] [name]  Procedure(s) Performed: ESOPHAGOGASTRODUODENOSCOPY (EGD) WITH PROPOFOL (N/A ) BIOPSY  Patient location during evaluation: PACU Anesthesia Type: MAC Level of consciousness: awake and alert and patient cooperative Pain management: satisfactory to patient Vital Signs Assessment: post-procedure vital signs reviewed and stable Respiratory status: spontaneous breathing Cardiovascular status: stable Postop Assessment: no apparent nausea or vomiting Anesthetic complications: no     Last Vitals:  Vitals:   10/02/18 0640 10/02/18 1138  BP: 115/77   Pulse: 88 82  Resp:  20  Temp: 36.8 C 36.8 C  SpO2: 99% 99%    Last Pain:  Vitals:   10/02/18 1138  TempSrc: Oral  PainSc: 1                  Brentlee Sciara

## 2018-10-02 NOTE — Transfer of Care (Signed)
Immediate Anesthesia Transfer of Care Note  Patient: [name] [name]  Procedure(s) Performed: ESOPHAGOGASTRODUODENOSCOPY (EGD) WITH PROPOFOL (N/A ) BIOPSY  Patient Location: PACU  Anesthesia Type:MAC  Level of Consciousness: awake and patient cooperative  Airway & Oxygen Therapy: Patient Spontanous Breathing and Patient connected to nasal cannula oxygen  Post-op Assessment: Report given to RN and Post -op Vital signs reviewed and stable  Post vital signs: Reviewed and stable  Last Vitals:  Vitals Value Taken Time  BP 103/65 10/02/2018 12:45 PM  Temp    Pulse 84 10/02/2018 12:48 PM  Resp 24 10/02/2018 12:48 PM  SpO2 100 % 10/02/2018 12:48 PM  Vitals shown include unvalidated device data.  Last Pain:  Vitals:   10/02/18 1138  TempSrc: Oral  PainSc: 1       Patients Stated Pain Goal: 0 (94/32/76 1470)  Complications: No apparent anesthesia complications

## 2018-10-03 ENCOUNTER — Telehealth: Payer: Self-pay | Admitting: Gastroenterology

## 2018-10-03 DIAGNOSIS — K250 Acute gastric ulcer with hemorrhage: Secondary | ICD-10-CM

## 2018-10-03 DIAGNOSIS — K5900 Constipation, unspecified: Secondary | ICD-10-CM

## 2018-10-03 LAB — CBC
HCT: 25.2 % — ABNORMAL LOW (ref 39.0–52.0)
HEMOGLOBIN: 7.9 g/dL — AB (ref 13.0–17.0)
MCH: 30.9 pg (ref 26.0–34.0)
MCHC: 31.3 g/dL (ref 30.0–36.0)
MCV: 98.4 fL (ref 80.0–100.0)
NRBC: 0 % (ref 0.0–0.2)
Platelets: 211 10*3/uL (ref 150–400)
RBC: 2.56 MIL/uL — ABNORMAL LOW (ref 4.22–5.81)
RDW: 18.2 % — ABNORMAL HIGH (ref 11.5–15.5)
WBC: 10.1 10*3/uL (ref 4.0–10.5)

## 2018-10-03 LAB — HIV ANTIBODY (ROUTINE TESTING W REFLEX): HIV Screen 4th Generation wRfx: NONREACTIVE

## 2018-10-03 LAB — TROPONIN I: Troponin I: 0.2 ng/mL (ref <0.03)

## 2018-10-03 LAB — BASIC METABOLIC PANEL
ANION GAP: 7 (ref 5–15)
BUN: 24 mg/dL — ABNORMAL HIGH (ref 8–23)
CO2: 27 mmol/L (ref 22–32)
Calcium: 7.5 mg/dL — ABNORMAL LOW (ref 8.9–10.3)
Chloride: 106 mmol/L (ref 98–111)
Creatinine, Ser: 1.1 mg/dL (ref 0.61–1.24)
GFR calc Af Amer: >60 mL/min (ref >60)
GFR calc non Af Amer: >60 mL/min (ref >60)
Glucose, Bld: 149 mg/dL — ABNORMAL HIGH (ref 70–99)
POTASSIUM: 3.2 mmol/L — AB (ref 3.5–5.1)
Sodium: 140 mmol/L (ref 135–145)

## 2018-10-03 LAB — GLUCOSE, CAPILLARY: Glucose-Capillary: 187 mg/dL — ABNORMAL HIGH (ref 70–99)

## 2018-10-03 LAB — PREPARE RBC (CROSSMATCH)

## 2018-10-03 MED ORDER — SIMETHICONE 40 MG/0.6ML PO SUSP
40.0000 mg | Freq: Four times a day (QID) | ORAL | Status: DC | PRN
  Administered 2018-10-03 – 2018-10-04 (×3): 40 mg via ORAL
  Filled 2018-10-03: qty 30

## 2018-10-03 MED ORDER — PROCHLORPERAZINE EDISYLATE 10 MG/2ML IJ SOLN
10.0000 mg | Freq: Four times a day (QID) | INTRAMUSCULAR | Status: DC | PRN
  Filled 2018-10-03: qty 2

## 2018-10-03 MED ORDER — FUROSEMIDE 10 MG/ML IJ SOLN
20.0000 mg | Freq: Once | INTRAMUSCULAR | Status: AC
  Administered 2018-10-03: 20 mg via INTRAVENOUS
  Filled 2018-10-03: qty 2

## 2018-10-03 MED ORDER — POTASSIUM CHLORIDE CRYS ER 20 MEQ PO TBCR
40.0000 meq | EXTENDED_RELEASE_TABLET | Freq: Two times a day (BID) | ORAL | Status: DC
  Administered 2018-10-03 – 2018-10-04 (×2): 40 meq via ORAL
  Filled 2018-10-03 (×2): qty 2

## 2018-10-03 MED ORDER — LINACLOTIDE 145 MCG PO CAPS
290.0000 ug | ORAL_CAPSULE | Freq: Every day | ORAL | Status: DC
  Administered 2018-10-03 – 2018-10-04 (×2): 290 ug via ORAL
  Filled 2018-10-03 (×2): qty 2

## 2018-10-03 MED ORDER — SODIUM CHLORIDE 0.9 % IV BOLUS
250.0000 mL | Freq: Once | INTRAVENOUS | Status: AC
  Administered 2018-10-03: 250 mL via INTRAVENOUS

## 2018-10-03 MED ORDER — SODIUM CHLORIDE 0.9 % IV BOLUS: 500.0000 mL | Freq: Once | INTRAVENOUS | Status: DC

## 2018-10-03 MED ORDER — SODIUM CHLORIDE 0.9% IV SOLUTION
Freq: Once | INTRAVENOUS | Status: AC
  Administered 2018-10-03: 17:00:00 via INTRAVENOUS

## 2018-10-03 NOTE — Telephone Encounter (Signed)
Please make hospital follow up in six weeks for constipation, UGI bleed due to gastric ulcer. He will need repeat EGD in 12/2018.

## 2018-10-03 NOTE — Progress Notes (Signed)
Patient refuses the use of CPAP 

## 2018-10-03 NOTE — Progress Notes (Addendum)
PROGRESS NOTE    [name] [name]  DVV:[id number] DOB: 05/10/1951 DOA: 10/01/2018 PCP: Kathyrn Drown, MD     Brief Narrative:  68 y.o. male with medical history significant for ischemic cardiomyopathy, CABG x 6, TAVR, paroxysmal A. Fib, DM2, HTN, OSA, who presented to the ED with complaints of generalized abdominal pain over the past 4 weeks.  Yesterday patient noticed black stools scribed as " inky black".  Also one episode of coffee-ground emesis.  Patient has been taking Excedrin for headaches, daily over the past year and a half.  Patient is also on Plavix. Due to start Eliquis for atrial fibrillation.   Assessment & Plan: 1-acute blood loss anemia in the setting of upper GI bleed -due to erosive gastritis and stomach ulcer as seen on EGD -will continue holding NSAID's, plavix and anticoagulation -continue PPI -1 unit of PRBC given on 10/01/18 -will transfuse 2 more units. -follow Hgb trend; most recent one 7.9  2-elevated troponin -in the setting od demand ischemia with anemia and transient episode of A. Fib -rate now controlled -patient denies CPa nd SOB -Hgb  7.9 this morning -will transfuse 2 units of PRBC's -follow trend   3-chronic and compensated systolic heart failure due to ischemic cardiomyopathy: EF 15-20% -follow daily weights -continue strict intake and output -resume diuretics, b-blocker and spironolactone -heart healthy diet discussed with patient -continue holding plavix for now  4-PAF/TAVR -holding anticoagulation for another 5 days as per GI rec's -continue monitoring on telemetry -resume b-blocker  5-HTN -stable and well controlled -follow VS -resume home heart/antihypertensive meds  6-OSA -continue CPAP QHS  7-type 2 diabetes -continue SSI -follow CBG's -resume lower dose of levemir  8-obesity class 2 -Body mass index is 39.45 kg/m. -low calorie diet, portion control and increase exercise discussed with patient.  9-HLD -continue  statins.  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 -Minimize the the use of extra nephrotoxic agents and decrease chances for low blood pressure.  DVT prophylaxis: SCD's Code Status: full code Family Communication: no family at bedside  Disposition Plan: continue protonix, advance diet, follow Hgb trend, continue holding anticoagulation, monitor on telemetry.  Consultants:   GI service.  Procedures:   EGD 10/02/2018: demonstrated stomach ulcer, erosive gastritis, Schatzki ring and hiatal hernia.   Antimicrobials:  Anti-infectives (From admission, onward)   None       Subjective: Afebrile, no chest pain, no shortness of breath.  Patient reports no further stools and in fact complaining of constipation currently.  Also expressed feeling nauseous, weak and tired.  Hemoglobin down to 7.9.  Objective: Vitals:   10/02/18 1321 10/02/18 1353 10/02/18 2317 10/03/18 0613  BP:  111/84 (!) 91/59 (!) 91/56  Pulse:  92 95 91  Resp: 18 18 20 18   Temp:  (!) 97.4 F (36.3 C) 98.4 F (36.9 C) 98 F (36.7 C)  TempSrc:  Oral Oral Oral  SpO2: 99% 94% 100% 98%  Weight:      Height:        Intake/Output Summary (Last 24 hours) at 10/03/2018 1136 Last data filed at 10/02/2018 1235 Gross per 24 hour  Intake 300 ml  Output -  Net 300 ml   Filed Weights   10/01/18 1129 10/01/18 1828  Weight: 124.7 kg 124.7 kg    Examination: General exam: Alert, awake, oriented x 3; patient denies chest pain or shortness of breath.  Still feeling weak,  tired and this morning reporting presence of nausea and complaining of constipation. Respiratory system: Clear to auscultation. Respiratory effort normal. Cardiovascular system:Rate controlled. No murmurs, rubs, gallops. Gastrointestinal system: Abdomen is obese, nondistended, soft and nontender. No organomegaly or masses felt. Normal bowel  sounds heard. Central nervous system: Alert and oriented. No focal neurological deficits. Extremities: No Cyanosis, no clubbing  Skin: No rashes, lesions or ulcers Psychiatry: Judgement and insight appear normal. Mood & affect appropriate.   Data Reviewed: I have personally reviewed following labs and imaging studies  CBC: Recent Labs  Lab 10/01/18 1213 10/01/18 1932 10/02/18 0504 10/02/18 1053 10/03/18 0805  WBC 13.3*  --  11.9* 9.8 10.1  NEUTROABS 10.8*  --   --   --   --   HGB 9.0* 8.2* 9.0* 8.7* 7.9*  HCT 27.8* 26.2* 27.4* 27.5* 25.2*  MCV 98.2  --  96.5 97.9 98.4  PLT 289  --  242 230 952   Basic Metabolic Panel: Recent Labs  Lab 10/01/18 1213 10/02/18 0504 10/02/18 0508 10/03/18 0805  NA 139 140  --  140  K 3.4* 3.2*  --  3.2*  CL 98 105  --  106  CO2 30 25  --  27  GLUCOSE 200* 158*  --  149*  BUN 60* 43*  --  24*  CREATININE 1.25* 1.19  --  1.10  CALCIUM 8.3* 7.7*  --  7.5*  MG  --   --  2.1  --   PHOS  --   --  3.1  --    GFR: Estimated Creatinine Clearance: 85.2 mL/min (by C-G formula based on SCr of 1.1 mg/dL).   Liver Function Tests: Recent Labs  Lab 10/01/18 1213  AST 19  ALT 11  ALKPHOS 52  BILITOT 0.9  PROT 6.1*  ALBUMIN 2.9*   Cardiac Enzymes: Recent Labs  Lab 10/01/18 2113 10/02/18 0504 10/02/18 1053 10/02/18 1707 10/02/18 2305  TROPONINI <0.03 0.19* 0.34* 0.25* 0.20*   CBG: Recent Labs  Lab 10/02/18 0808 10/02/18 1103 10/02/18 1251 10/02/18 1622 10/02/18 2259  GLUCAP 153* 153* 158* 157* 194*   Urine analysis:    Component Value Date/Time   COLORURINE YELLOW 10/01/2018 [place] 10/01/2018 1146   LABSPEC 1.013 10/01/2018 1146   PHURINE 7.0 10/01/2018 1146   GLUCOSEU NEGATIVE 10/01/2018 1146   HGBUR NEGATIVE 10/01/2018 [place] 10/01/2018 1146   KETONESUR NEGATIVE 10/01/2018 1146   PROTEINUR NEGATIVE 10/01/2018 1146   NITRITE NEGATIVE 10/01/2018 1146   [place]  10/01/2018 1146    Radiology Studies: No results found.   Scheduled Meds: . sodium chloride   Intravenous Once  . carvedilol  6.25 mg Oral BID WC  . furosemide  20 mg Intravenous Once  . insulin detemir  20 Units Subcutaneous Q2200  . linaclotide  290 mcg Oral QAC breakfast  . pantoprazole (PROTONIX) IV  40 mg Intravenous Q12H  . potassium chloride SA  40 mEq Oral BID  . rosuvastatin  40 mg Oral q1800  . spironolactone  25 mg Oral Daily  . torsemide  20 mg Oral BID   Continuous Infusions:   LOS: 1 day    Time spent: 30 minutes.   Barton Dubois, MD [facility] Pager [phone]   10/03/2018, 11:36 AM

## 2018-10-03 NOTE — Progress Notes (Signed)
Subjective:  Wants to have a BM. Feels like he is constipated. Chronic issue for him. No abdominal pain this morning. No n/v.   Objective: Vital signs in last 24 hours: Temp:  [97.4 F (36.3 C)-98.4 F (36.9 C)] 98 F (36.7 C) (02/06 6712) Pulse Rate:  [82-95] 91 (02/06 0613) Resp:  [18-20] 18 (02/06 0613) BP: (91-118)/(56-84) 91/56 (02/06 4580) SpO2:  [94 %-100 %] 98 % (02/06 0613) Last BM Date: 10/01/18 General:   Alert,  Well-developed, well-nourished, pleasant and cooperative in NAD Head:  Normocephalic and atraumatic. Eyes:  Sclera clear, no icterus.  Chest: CTA bilaterally without rales, rhonchi, crackles.    Heart:  Regular rate and rhythm; no murmurs, clicks, rubs,  or gallops. Abdomen:  Soft, nontender and nondistended. No masses, hepatosplenomegaly or hernias noted. Normal bowel sounds, without guarding, and without rebound.   Extremities:  Without clubbing, deformity. 2+ pitting edema to lower extremities bilaterally, up to knees.  Neurologic:  Alert and  oriented x4;  grossly normal neurologically. Skin:  Intact without significant lesions or rashes. Psych:  Alert and cooperative. Normal mood and affect.  Intake/Output from previous day: 02/05 0701 - 02/06 0700 In: 300 [I.V.:300] Out: 450 [Urine:450] Intake/Output this shift: No intake/output data recorded.  Lab Results: CBC Recent Labs    10/02/18 0504 10/02/18 1053 10/03/18 0805  WBC 11.9* 9.8 10.1  HGB 9.0* 8.7* 7.9*  HCT 27.4* 27.5* 25.2*  MCV 96.5 97.9 98.4  PLT 242 230 211   BMET Recent Labs    10/01/18 1213 10/02/18 0504 10/03/18 0805  NA 139 140 140  K 3.4* 3.2* 3.2*  CL 98 105 106  CO2 30 25 27   GLUCOSE 200* 158* 149*  BUN 60* 43* 24*  CREATININE 1.25* 1.19 1.10  CALCIUM 8.3* 7.7* 7.5*   LFTs Recent Labs    10/01/18 1213  BILITOT 0.9  ALKPHOS 52  AST 19  ALT 11  PROT 6.1*  ALBUMIN 2.9*   No results for input(s): LIPASE in the last 72 hours. PT/INR No results for input(s):  LABPROT, INR in the last 72 hours.    Imaging Studies: No results found.[2 weeks]   Assessment: 68 year old male presenting with upper GI bleed in the setting of Eliquis, Plavix, intermittent aspirin powders.  Patient was on Eliquis only a few days before discontinued due to side effects.  Plans to start Xarelto although patient had not started this prior to admission.  EGD yesterday with mild Schatzki ring, small hiatal hernia, nonbleeding cratered gastric ulcer with no stigmata of bleeding found at the pylorus.  Measured 12 mm in largest diameter.  Surrounding mucosal deformity with numerous 3 to 4 mm erosions.  Status post gastric biopsy.  Findings likely related to NSAID use.  His Hgb has slightly declined today. No overt gi bleeding.   Today complains of chronic constipation.  He states he needs relief today.  At home he typically takes MiraLAX along with Dulcolax and sometimes milk of magnesia.  He describes passing hard stools with infrequent bowel movements sometimes up to a week without going.  No prior colonoscopy.  Plan: 1. Protonix 40 mg twice daily. 2. Hold Plavix and anticoagulation for 5 more days.  GI recommendations for patient not to be both on Plavix and anticoagulation moving forward however if dual therapy needed, consider aspirin in lieu of Plavix if risk does not exceed the benefits. 3. Avoid all other forms of NSAIDs/ASA products. 4. Follow-up pathology. 5. Plan for repeat EGD in 3  months.  We will see the patient back in the office prior to this. 6. Start Linzess 290 mcg daily for constipation.  Patient describes chronic issues which is not well managed at home with MiraLAX, Dulcolax, milk of magnesia.  If 290 mcg daily is too strong we can always back down to 126mcg or 66mcg.  Patient and wife aware of options + will keep [place] posted.  They are also aware if Linzess is not covered by the insurance plans, they can contact us and we will provide further options.  Will  reassess during follow-up office visit.  Laureen Ochs. Bernarda Caffey [facility] [phone] 2/6/20209:32 AM     LOS: 1 day

## 2018-10-03 NOTE — Progress Notes (Addendum)
Patient is complaining of chest pain radiating to left arm. Patient rates pain 3/10. Paged mid level. No new orders at this time.  Mid level placed order for stat 12 lead EKG. Will continue to monitor throughout shift.

## 2018-10-03 NOTE — Progress Notes (Signed)
Patients troponin level is 0.20. previous troponin was 0.25. Paged Mid level with results. No new orders at this time. Will continue to monitor throughout shift.

## 2018-10-04 DIAGNOSIS — K250 Acute gastric ulcer with hemorrhage: Principal | ICD-10-CM

## 2018-10-04 DIAGNOSIS — I10 Essential (primary) hypertension: Secondary | ICD-10-CM

## 2018-10-04 DIAGNOSIS — N182 Chronic kidney disease, stage 2 (mild): Secondary | ICD-10-CM

## 2018-10-04 DIAGNOSIS — E1122 Type 2 diabetes mellitus with diabetic chronic kidney disease: Secondary | ICD-10-CM

## 2018-10-04 DIAGNOSIS — I480 Paroxysmal atrial fibrillation: Secondary | ICD-10-CM

## 2018-10-04 DIAGNOSIS — E1121 Type 2 diabetes mellitus with diabetic nephropathy: Secondary | ICD-10-CM

## 2018-10-04 DIAGNOSIS — I2510 Atherosclerotic heart disease of native coronary artery without angina pectoris: Secondary | ICD-10-CM

## 2018-10-04 LAB — TYPE AND SCREEN
ABO/RH(D): A POS
Antibody Screen: NEGATIVE
Unit division: 0
Unit division: 0
Unit division: 0

## 2018-10-04 LAB — BPAM RBC
Blood Product Expiration Date: 202002262359
Blood Product Expiration Date: 202003032359
Blood Product Expiration Date: 202003082359
ISSUE DATE / TIME: 202002042313
ISSUE DATE / TIME: 202002061713
ISSUE DATE / TIME: 202002062104
Unit Type and Rh: 6200
Unit Type and Rh: 6200
Unit Type and Rh: 6200

## 2018-10-04 LAB — CBC
HCT: 33.4 % — ABNORMAL LOW (ref 39.0–52.0)
Hemoglobin: 10.6 g/dL — ABNORMAL LOW (ref 13.0–17.0)
MCH: 30.7 pg (ref 26.0–34.0)
MCHC: 31.7 g/dL (ref 30.0–36.0)
MCV: 96.8 fL (ref 80.0–100.0)
NRBC: 0 % (ref 0.0–0.2)
Platelets: 245 10*3/uL (ref 150–400)
RBC: 3.45 MIL/uL — ABNORMAL LOW (ref 4.22–5.81)
RDW: 18.5 % — ABNORMAL HIGH (ref 11.5–15.5)
WBC: 11.1 10*3/uL — AB (ref 4.0–10.5)

## 2018-10-04 MED ORDER — MUSCLE RUB 10-15 % EX CREA
TOPICAL_CREAM | CUTANEOUS | Status: DC | PRN
  Administered 2018-10-04: 07:00:00 via TOPICAL
  Filled 2018-10-04 (×2): qty 85

## 2018-10-04 MED ORDER — APIXABAN 5 MG PO TABS: 5.0000 mg | ORAL_TABLET | Freq: Two times a day (BID) | ORAL | 3 refills | Status: DC

## 2018-10-04 MED ORDER — LINACLOTIDE 290 MCG PO CAPS: 290.0000 ug | ORAL_CAPSULE | Freq: Every day | ORAL | 1 refills | Status: DC

## 2018-10-04 MED ORDER — PANTOPRAZOLE SODIUM 40 MG PO TBEC: 40.0000 mg | DELAYED_RELEASE_TABLET | Freq: Two times a day (BID) | ORAL | 3 refills | Status: DC

## 2018-10-04 MED ORDER — OXYCODONE HCL 5 MG PO TABS: 5.0000 mg | ORAL_TABLET | Freq: Once | ORAL | Status: DC

## 2018-10-04 NOTE — Discharge Summary (Signed)
Physician Discharge Summary  [name] [name] MVH:[id number] DOB: 16-Sep-1950 DOA: 10/01/2018  PCP: Kathyrn Drown, MD  Admit date: 10/01/2018 Discharge date: 10/04/2018  Time spent: 35 minutes  Recommendations for Outpatient Follow-up:  1. Repeat CBC to follow Hgb trend 2. Repeat BMET to follow electrolytes and renal function  3. Please reassess migraine and if appropriate start preventive management with topamax.   Discharge Diagnoses:  Active Problems:   Class 2 severe obesity due to excess calories with serious comorbidity and body mass index (BMI) of 39.0 to 39.9 in adult [facility])   Type 2 diabetes with nephropathy (HCC)   PAF (paroxysmal atrial fibrillation) (HCC)   GI bleed   Acute upper GI bleed   Acute gastric ulcer with hemorrhage   CKD stage 2 due to type 2 diabetes mellitus ([place])   Discharge Condition: stable and improved. Discharge home with instructions to follow up with PCP in 10 days and with GI and cardiology service as an outpatient.   Diet recommendation: modified carb diet and heart healthy diet.  Filed Weights   10/01/18 1129 10/01/18 1828  Weight: 124.7 kg 124.7 kg    Brief History of present illness:  As per H&P written by Dr. Denton Brick on 10/01/18 68 y.o. male with medical history significant for ischemic cardiomyopathy, CABG x 6, TAVR, paroxysmal A. Fib, DM2, HTN, OSA, who presented to the ED with complaints of generalized abdominal pain over the past 4 weeks.  Yesterday patient noticed black stools scribed as " inky black".  Also one episode of coffee-ground emesis.  Patient has been taking Excedrin for headaches, daily over the past year and a half.  Patient is also on Plavix.  Patient was supposed to start taking Eliquis today paroxysmal atrial fibrillation, but has not had any yet.  ED Course: Stable vitals, blood pressure systolic 841L to 244.  Hemoglobin 9, drop, baseline appears to be 12-15.  Leukocytosis 13.3.  Platelets normal 289.  Gi consulted in ED,  patient seen, anticipate EGD in a.m. by Dr. Buford Dresser, Protonix GTT.  Transfuse as needed, clear liquids, n.p.o. midnight except for meds.   Hospital Course:  1-acute blood loss anemia in the setting of upper GI bleed -due to erosive gastritis and stomach ulcer as seen on EGD -will continue holding NSAID's, plavix and anticoagulation; per GI safe to resume anticoagulation on 10/08/18 -continue PPI -received 3 units of PRBC's during hospitalization -at discharge Hgb 10.6  2-elevated troponin -in the setting od demand ischemia with anemia and transient episode of A. Fib -rate now controlled -patient denies CP and SOB -resume cardiac regimen at discharge. -advise to follow heart healthy diet.   3-chronic and compensated systolic heart failure due to ischemic cardiomyopathy: EF 15-20% -follow daily weights -maintain adequate hydration  -resume home heart failure regimen  -heart healthy/low sodium diet discussed with patient -continue holding plavix and ASA for now -would resume Eliquis in 4 more days.  4-PAF/TAVR -holding anticoagulation for another 4 days as per GI rec's -resume b-blocker  5-HTN -stable and well controlled -follow VS -resume home heart/antihypertensive meds  6-OSA -continue CPAP QHS  7-type 2 diabetes -resume home hypoglycemic regimen and adjust treatment as needed base CBG's fluctuation.   8-obesity class 2 -Body mass index is 39.45 kg/m. -low calorie diet, portion control and increase exercise discussed with patient.  9-HLD -continue statins.  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 -Minimize the the  use of extra nephrotoxic agents and decrease chances for low blood pressure.  11-Migraine -avoid the use of NSAID's -assess capability and safety of starting preventive measurements with topamax.   Procedures:  EGD 10/02/2018:  demonstrated stomach ulcer, erosive gastritis, Schatzki ring and hiatal hernia.   Consultations:  GI service.  Discharge Exam: Vitals:   10/04/18 0552 10/04/18 0554  BP: (!) 135/124 101/89  Pulse: 88 80  Resp: 18   Temp: 98 F (36.7 C)   SpO2: 99%    General exam: Alert, awake, oriented x 3; patient denies chest pain or shortness of breath.  Still feeling weak, tired and this morning reporting presence of nausea and complaining of constipation. Respiratory system: Clear to auscultation. Respiratory effort normal. Cardiovascular system:Rate controlled. No murmurs, rubs, gallops. Gastrointestinal system: Abdomen is obese, nondistended, soft and nontender. No organomegaly or masses felt. Normal bowel sounds heard. Central nervous system: Alert and oriented. No focal neurological deficits. Extremities: No Cyanosis, no clubbing; trace edema bilaterally. Skin: No rashes, lesions or ulcers Psychiatry: Judgement and insight appear normal. Mood & affect appropriate.    Discharge Instructions   Discharge Instructions    (HEART FAILURE PATIENTS) Call MD:  Anytime you have any of the following symptoms: 1) 3 pound weight gain in 24 hours or 5 pounds in 1 week 2) shortness of breath, with or without a dry hacking cough 3) swelling in the hands, feet or stomach 4) if you have to sleep on extra pillows at night in order to breathe.   Complete by:  As directed    Diet - low sodium heart healthy   Complete by:  As directed    Discharge instructions   Complete by:  As directed    Follow low sodium diet Take medications as prescribed  Avoid the use of Pavix, Aspirin or any other NSAID's (ibuprofen, advil, excedrin, naproxen, goody powder, BC powder, Aleve, Motrin, etc) Follow up with PCP in 10 days Follow up with GI service as instructed and with cardiology service as previously scheduled.     Allergies as of 10/04/2018      Reactions   Dyflex-g [dyphylline-guaifenesin] Hives   Eliquis  [apixaban] Other (See Comments)   Side effects not a true allergy- he stated that it made him feel bad      Medication List    STOP taking these medications   clopidogrel 75 MG tablet Commonly known as:  PLAVIX   Rivaroxaban 15 MG Tabs tablet Commonly known as:  XARELTO     TAKE these medications   amoxicillin 500 MG tablet Commonly known as:  AMOXIL Take 4 tablets (2,000 mg total) by mouth as directed.   amoxicillin 500 MG tablet Commonly known as:  AMOXIL Take 1 tablet (500 mg total) by mouth 3 (three) times daily.   apixaban 5 MG Tabs tablet Commonly known as:  ELIQUIS Take 1 tablet (5 mg total) by mouth 2 (two) times daily. Start taking on:  October 08, 2018   carvedilol 6.25 MG tablet Commonly known as:  COREG Take 1 tablet (6.25 mg total) by mouth 2 (two) times daily.   CENTRUM SILVER PO Take 2 tablets by mouth daily.   HYDROcodone-acetaminophen 10-325 MG tablet Commonly known as:  NORCO One bid prn pain   LEVEMIR FLEXTOUCH 100 UNIT/ML Pen Generic drug:  Insulin Detemir INJECT 60 UNITS INTO THE SKIN EACH EVENING. MAY TITRATE UP TO 80 UNITS.   linaclotide 290 MCG Caps capsule Commonly known as:  LINZESS Take 1  capsule (290 mcg total) by mouth daily before breakfast. Start taking on:  October 05, 2018   liver oil-zinc oxide 40 % ointment Commonly known as:  DESITIN Apply 1 application topically as needed for irritation.   metolazone 2.5 MG tablet Commonly known as:  ZAROXOLYN TAKE ONE TABLET BY MOUTH DAILY.   nitroGLYCERIN 0.4 MG SL tablet Commonly known as:  NITROSTAT PLACE 1 TABLET UNDER TONGUE FOR CHEST PAIN. MAY REPEAT EVERY 5 MIN UPTO 3 DOSES-NO RELIEF,CALL 911.   NOVOLOG FLEXPEN 100 UNIT/ML FlexPen Generic drug:  insulin aspart INJECT 20 UNITS INTO THE SKIN THREE TIMES DAILY WITH MEALS.   pantoprazole 40 MG tablet Commonly known as:  PROTONIX Take 1 tablet (40 mg total) by mouth 2 (two) times daily.   polycarbophil 625 MG tablet Commonly  known as:  FIBERCON Take 2 tablets (1,250 mg total) by mouth daily with lunch. Hold if you have diarrhea   potassium chloride SA 20 MEQ tablet Commonly known as:  KLOR-CON M20 Take 2 tablets (40 mEq total) by mouth 3 (three) times daily.   rosuvastatin 40 MG tablet Commonly known as:  CRESTOR Take 1 tablet (40 mg total) by mouth daily.   spironolactone 25 MG tablet Commonly known as:  ALDACTONE Take 1 tablet (25 mg total) by mouth daily.   SURE COMFORT PEN NEEDLES 31G X 8 MM Misc Generic drug:  Insulin Pen Needle USING 4 TIMES DAILY.   torsemide 20 MG tablet Commonly known as:  DEMADEX 2 qam and 2 q 3pm   traMADol 50 MG tablet Commonly known as:  ULTRAM Take one po Q 6-8 prn headache.   trolamine salicylate 10 % cream Commonly known as:  ASPERCREME Apply 1 application topically 2 (two) times daily as needed for muscle pain.      Allergies  Allergen Reactions  . Dyflex-G [Dyphylline-Guaifenesin] Hives  . Eliquis [Apixaban] Other (See Comments)    Side effects not a true allergy- he stated that it made him feel bad   Follow-up Information    Luking, Elayne Snare, MD. Schedule an appointment as soon as possible for a visit in 10 day(s).   Specialty:  Family Medicine Contact information: [place] [address] [phone]        Constance Haw, MD .   Specialty:  Cardiology Contact information: [address] [place] [address] [place] [address] [phone]        Arnoldo Lenis, MD .   Specialty:  Cardiology Contact information: [address] [place] [address] [phone]        Sherren Mocha, MD .   Specialty:  Cardiology Contact information: [address] [name] [address] [phone]           The results of significant diagnostics from this hospitalization (including imaging, microbiology, ancillary and laboratory) are listed below for reference.    Labs: Basic Metabolic  Panel: Recent Labs  Lab 10/01/18 1213 10/02/18 0504 10/02/18 0508 10/03/18 0805  NA 139 140  --  140  K 3.4* 3.2*  --  3.2*  CL 98 105  --  106  CO2 30 25  --  27  GLUCOSE 200* 158*  --  149*  BUN 60* 43*  --  24*  CREATININE 1.25* 1.19  --  1.10  CALCIUM 8.3* 7.7*  --  7.5*  MG  --   --  2.1  --   PHOS  --   --  3.1  --    Liver Function Tests: Recent Labs  Lab 10/01/18 1213  AST 19  ALT 11  ALKPHOS 52  BILITOT 0.9  PROT 6.1*  ALBUMIN 2.9*   CBC: Recent Labs  Lab 10/01/18 1213 10/01/18 1932 10/02/18 0504 10/02/18 1053 10/03/18 0805 10/04/18 0633  WBC 13.3*  --  11.9* 9.8 10.1 11.1*  NEUTROABS 10.8*  --   --   --   --   --   HGB 9.0* 8.2* 9.0* 8.7* 7.9* 10.6*  HCT 27.8* 26.2* 27.4* 27.5* 25.2* 33.4*  MCV 98.2  --  96.5 97.9 98.4 96.8  PLT 289  --  242 230 211 245   Cardiac Enzymes: Recent Labs  Lab 10/01/18 2113 10/02/18 0504 10/02/18 1053 10/02/18 1707 10/02/18 2305  TROPONINI <0.03 0.19* 0.34* 0.25* 0.20*   BNP: BNP (last 3 results) Recent Labs    11/15/17 1601 09/12/18 0901  BNP 341.2* 477.4*    CBG: Recent Labs  Lab 10/02/18 1103 10/02/18 1251 10/02/18 1622 10/02/18 2259 10/03/18 2143  GLUCAP 153* 158* 157* 194* 187*    Signed:  Barton Dubois MD.  [facility] 10/04/2018, 12:56 PM

## 2018-10-04 NOTE — Progress Notes (Signed)
Reassessed patients chest pain 5 minutes after receiving Sublingual Nitroglycerin patient states chest pain is better. Will continue to monitor throughout shift.

## 2018-10-04 NOTE — Progress Notes (Signed)
    Asked to review the patient's chart by Dr. Dyann Kief as this patient was on Eliquis and Plavix PTA but is currently admitted with a GIB and GI prefers for him not be on both if possible. By review of his chart, he is s/p TAVR in 10/2017 but has not undergone any recent PCI procedures within the past 6 months. Was previously on ASA and Plavix but ASA was discontinued and transitioned to Eliquis when diagnosed with PAF in 05/2018. In reviewing with Dr. Domenic Polite, would recommend stopping Plavix and resuming Eliquis once safe from a GI perspective.  Signed, Erma Heritage, PA-C 10/04/2018, 9:21 AM Pager: [phone]

## 2018-10-04 NOTE — Progress Notes (Signed)
Patient states understanding of discharge instructions.  

## 2018-10-04 NOTE — Telephone Encounter (Signed)
SCHEDULED AND NOTIFIED NURSE, SHE WILL LET PATIENT KNOW

## 2018-10-04 NOTE — Progress Notes (Signed)
Patient still complaint of chest pain. Gave one SL nitroglycerin tablet. Will recheck patients chest pain in 5 minutes of administering nitroglycerin. Will continue to monitor throughout shift.

## 2018-10-04 NOTE — Progress Notes (Signed)
Patient complaint of left shoulder pain. PRN pain medication is not due at this time. MD made aware. No new orders at this time. Will continue to monitor throughout shift.

## 2018-10-04 NOTE — Progress Notes (Signed)
MD placed order for one time order oxycodone immediate release. Spoke with patient about the order. Patient stated the left shoulder pain felt like more muscle and didn't want the pain pill. Paged MD to call. MD called and discontinued one time order. I placed an order for ben gay per general admission PRN orders. Will continue to monitor throughout shift.

## 2018-10-05 ENCOUNTER — Encounter: Payer: Self-pay | Admitting: Internal Medicine

## 2018-10-06 ENCOUNTER — Other Ambulatory Visit: Payer: Self-pay | Admitting: Family Medicine

## 2018-10-07 ENCOUNTER — Encounter (HOSPITAL_COMMUNITY): Payer: Self-pay | Admitting: Internal Medicine

## 2018-10-07 ENCOUNTER — Telehealth: Payer: Self-pay | Admitting: Family Medicine

## 2018-10-07 NOTE — Telephone Encounter (Signed)
Nurse's-patient recently discharged from the hospital. Please call patient, let them know that we are aware that they were discharged from the hospital. Please schedule them to follow-up with [place] within the next 7 days. Advised the patient to bring all of their medications with him to the visit. Please inquire if they are having any acute issues currently and documented accordingly.  Patient has follow-up office visit scheduled by hospital on Wednesday we will do further follow-up care at that point

## 2018-10-09 ENCOUNTER — Ambulatory Visit (INDEPENDENT_AMBULATORY_CARE_PROVIDER_SITE_OTHER): Payer: Medicare Other | Admitting: Family Medicine

## 2018-10-09 ENCOUNTER — Encounter: Payer: Self-pay | Admitting: Family Medicine

## 2018-10-09 VITALS — BP 130/78 | Wt 294.0 lb

## 2018-10-09 DIAGNOSIS — K921 Melena: Secondary | ICD-10-CM

## 2018-10-09 DIAGNOSIS — I10 Essential (primary) hypertension: Secondary | ICD-10-CM | POA: Diagnosis not present

## 2018-10-09 DIAGNOSIS — N183 Chronic kidney disease, stage 3 unspecified: Secondary | ICD-10-CM

## 2018-10-09 MED ORDER — RIVAROXABAN 15 MG PO TABS: 15.0000 mg | ORAL_TABLET | Freq: Every day | ORAL | 5 refills | Status: DC

## 2018-10-09 MED ORDER — CARVEDILOL 3.125 MG PO TABS: 3.1250 mg | ORAL_TABLET | Freq: Two times a day (BID) | ORAL | 3 refills | Status: DC

## 2018-10-09 MED ORDER — PANTOPRAZOLE SODIUM 40 MG PO TBEC: 40.0000 mg | DELAYED_RELEASE_TABLET | Freq: Two times a day (BID) | ORAL | 3 refills | Status: DC

## 2018-10-09 MED ORDER — HYDROCODONE-ACETAMINOPHEN 10-325 MG PO TABS: ORAL_TABLET | ORAL | 0 refills | Status: DC

## 2018-10-09 NOTE — Progress Notes (Signed)
   Subjective:    Patient ID: [name] [name], male    DOB: 01-Aug-1951, 68 y.o.   MRN: [id number]  HPI Pt here today for hospital follow up. Pt was in [place] [facility] from 10/01/2018-10/04/2018. Pt states he is really fatigued.  Patient recently in the hospital with combination of CHF GI bleed and gastritis Had an EGD We will get a follow-up EGD 3 months Patient relates a lot of fatigue tiredness states his bowel movements are normal color denies any rectal bleeding. Relates swelling in the lower legs Takes his medicine on a regular basis Finds himself feeling very fatigued tired and dizzy when he stands up Gets dizzy and like he is going to pass out   Review of Systems  Constitutional: Negative for diaphoresis and fatigue.  HENT: Negative for congestion and rhinorrhea.   Respiratory: Positive for shortness of breath. Negative for cough.   Cardiovascular: Positive for leg swelling. Negative for chest pain.  Gastrointestinal: Negative for abdominal pain and diarrhea.  Musculoskeletal: Positive for arthralgias and back pain.  Skin: Negative for color change and rash.  Neurological: Negative for dizziness and headaches.  Psychiatric/Behavioral: Negative for behavioral problems and confusion.       Objective:   Physical Exam Vitals signs reviewed.  Constitutional:      General: He is not in acute distress. HENT:     Head: Normocephalic and atraumatic.  Eyes:     General:        Right eye: No discharge.        Left eye: No discharge.  Neck:     Trachea: No tracheal deviation.  Cardiovascular:     Rate and Rhythm: Normal rate. Rhythm irregular.     Heart sounds: Normal heart sounds. No murmur.  Pulmonary:     Effort: Pulmonary effort is normal. No respiratory distress.     Breath sounds: Normal breath sounds.  Musculoskeletal:        General: Swelling present.  Lymphadenopathy:     Cervical: No cervical adenopathy.  Skin:    General: Skin is warm and dry.  Neurological:   Mental Status: He is alert.     Coordination: Coordination normal.  Psychiatric:        Behavior: Behavior normal.           Assessment & Plan:  Orthostatic hypotension Decrease Coreg new dose 3.125 mg twice daily Continue diuretic Metolazone use every other day Follow-up in a few weeks for recheck Lab work indicated  Chronic kidney disease follow-up lab work healthy eating regular fluid intake recommended  GI bleed follow-up CBC continue PPI has resumed taking Xarelto because of atrial fib warning signs regarding GI bleed was discussed in detail  Chronic back pain uses hydrocodone intermittently new prescription was sent in

## 2018-10-10 ENCOUNTER — Other Ambulatory Visit: Payer: Self-pay | Admitting: *Deleted

## 2018-10-10 ENCOUNTER — Telehealth: Payer: Self-pay | Admitting: Family Medicine

## 2018-10-10 LAB — BASIC METABOLIC PANEL
BUN/Creatinine Ratio: 18 (ref 10–24)
BUN: 24 mg/dL (ref 8–27)
CO2: 32 mmol/L — ABNORMAL HIGH (ref 20–29)
Calcium: 7.4 mg/dL — ABNORMAL LOW (ref 8.6–10.2)
Chloride: 89 mmol/L — ABNORMAL LOW (ref 96–106)
Creatinine, Ser: 1.37 mg/dL — ABNORMAL HIGH (ref 0.76–1.27)
GFR calc Af Amer: 61 mL/min/{1.73_m2} (ref >59)
GFR calc non Af Amer: 53 mL/min/{1.73_m2} — ABNORMAL LOW (ref >59)
Glucose: 89 mg/dL (ref 65–99)
Potassium: 3.1 mmol/L — ABNORMAL LOW (ref 3.5–5.2)
Sodium: 139 mmol/L (ref 134–144)

## 2018-10-10 LAB — CBC WITH DIFFERENTIAL/PLATELET
Basophils Absolute: 0 10*3/uL (ref 0.0–0.2)
Basos: 1 %
EOS (ABSOLUTE): 0.2 10*3/uL (ref 0.0–0.4)
Eos: 2 %
Hematocrit: 30.1 % — ABNORMAL LOW (ref 37.5–51.0)
Hemoglobin: 10.1 g/dL — ABNORMAL LOW (ref 13.0–17.7)
IMMATURE GRANULOCYTES: 1 %
Immature Grans (Abs): 0.1 10*3/uL (ref 0.0–0.1)
Lymphocytes Absolute: 0.9 10*3/uL (ref 0.7–3.1)
Lymphs: 12 %
MCH: 30.2 pg (ref 26.6–33.0)
MCHC: 33.6 g/dL (ref 31.5–35.7)
MCV: 90 fL (ref 79–97)
Monocytes Absolute: 1.5 10*3/uL — ABNORMAL HIGH (ref 0.1–0.9)
Monocytes: 19 %
NEUTROS PCT: 65 %
Neutrophils Absolute: 5.2 10*3/uL (ref 1.4–7.0)
Platelets: 343 10*3/uL (ref 150–450)
RBC: 3.34 x10E6/uL — ABNORMAL LOW (ref 4.14–5.80)
RDW: 16.6 % — ABNORMAL HIGH (ref 11.6–15.4)
WBC: 7.9 10*3/uL (ref 3.4–10.8)

## 2018-10-10 MED ORDER — CEFDINIR 300 MG PO CAPS: 300.0000 mg | ORAL_CAPSULE | Freq: Two times a day (BID) | ORAL | 0 refills | Status: DC

## 2018-10-10 NOTE — Telephone Encounter (Signed)
Left message to return call to get more info.  

## 2018-10-10 NOTE — Telephone Encounter (Signed)
Pt's wife states [place], [place] has not received pts antibiotic yet.

## 2018-10-10 NOTE — Telephone Encounter (Signed)
Med sent to pharm. Pt notified.  

## 2018-10-10 NOTE — Telephone Encounter (Signed)
omnicef 300 bid ten d 

## 2018-10-10 NOTE — Telephone Encounter (Signed)
Yellow mucus, non stop cough, no fever, sob but same as when he was seen yesterday. Sinus pressure. States dr Nicki Reaper told him he would send in it yesterday but did not. Michela Pitcher it was something that was stronger than amoxil because he was on amoxil about one and a half to two weeks ago.   [place] apoth.

## 2018-10-15 ENCOUNTER — Other Ambulatory Visit: Payer: Self-pay | Admitting: *Deleted

## 2018-10-15 DIAGNOSIS — E876 Hypokalemia: Secondary | ICD-10-CM

## 2018-10-15 DIAGNOSIS — N289 Disorder of kidney and ureter, unspecified: Secondary | ICD-10-CM

## 2018-10-15 DIAGNOSIS — D649 Anemia, unspecified: Secondary | ICD-10-CM

## 2018-10-15 MED ORDER — POTASSIUM CHLORIDE CRYS ER 20 MEQ PO TBCR: EXTENDED_RELEASE_TABLET | ORAL | 0 refills | Status: DC

## 2018-10-22 NOTE — Progress Notes (Signed)
Cardiology Office Note    Date:  10/23/2018   ID:  STERLING MONDO, Alferd Apa Jan 26, 1951, MRN [id number]  PCP:  Kathyrn Drown, MD  Cardiologist: Carlyle Dolly, MD   EP: Dr. Curt Bears  Chief Complaint  Patient presents with  . Hospitalization Follow-up    History of Present Illness:    DAVINCI GLOTFELTY is a 68 y.o. male with past medical history of CAD (s/p CABG in 2001 with LIMA-LAD, SVG-RI, seq SVG-OM2-RPL, and seq SVG-Mrg-PDA, s/p stenting of SVG-RI, patent grafts by cath in 09/2017), severe AS (s/p TAVR in 10/2017), chronic combined systolic and diastolic CHF (EF 51-70% by echo in 12/2017 and s/p ICD placement in 2017), PAF, HTN, HLD, and Stage 3 CKD who presents to the office today for hospital follow-up.  He was last examined by Dr. Curt Bears in 05/2018 and denied any recent chest pain, palpitations, or dyspnea on exertion at that time. Recent device interrogation had shown episodes of atrial fibrillation and he was asymptomatic with this but given his elevated CHA2DS2-VASc Score, he was started on Eliquis for anticoagulation with ASA being discontinued.   He was most recently admitted to [facility] from 10/01/2018 to 10/04/2018 for evaluation of abdominal pain and black tarry stools. Hemoglobin was 8.2 upon arrival and he was admitted for further evaluation of his acute blood loss anemia. He underwent an EGD which showed a mild Schatzki ring, small hiatal hernia, and a nonbleeding gastric ulcer with no stigmata of bleeding. His findings were thought to be secondary to NSAID use. Plavix was held and it was recommended to hold anticoagulation for a total of 5 days. This was reviewed with Cardiology during admission as he was on both giving prior TAVR in 10/2017 and given no recent PCI procedures, Plavix was discontinued and he was continued on Eliquis.  In talking with the patient today, he reports overall doing well from a cardiac perspective since his recent hospitalization. Says he did  follow-up with his PCP in the interim and was switched from Eliquis to Xarelto. Denies any recurrent melena or hematochezia. He denies any recent chest pain, dyspnea on exertion, orthopnea, PND, or palpitations. He does continue to experience lower extremity edema and notes that Torsemide was recently increased by his PCP to 60mg  in AM and 40mg  in PM while taking Metolazone 2.5 mg every other day. Says that his edema has improved since hospital discharge and he is due for repeat labs today.   Past Medical History:  Diagnosis Date  . AICD (automatic cardioverter/defibrillator) present 05/18/2016  . Anginal pain ([place])    occ none recent  . Aortic stenosis   . Arthritis   . CAD (coronary artery disease)   . Cardiomyopathy, ischemic   . CHF (congestive heart failure) ([place])   . Chronic back pain   . Diabetes mellitus without complication ([place])   . Elevated PSA   . History of kidney stones   . Hyperlipidemia   . Morbid obesity ([place])   . Myocardial infarction ([place])   . OSA (obstructive sleep apnea)    does not wear CPAP, -order run out told need to redo  test  . Paroxysmal atrial fibrillation ([place]) 09/29/2018   This was found on event monitor October 2019  . 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 APPENDECTOMY    . BIOPSY  10/02/2018   Procedure: BIOPSY;  Surgeon: Daneil Dolin, MD;  [facility];  Service: Endoscopy;;  gastric  . CARDIAC CATHETERIZATION     stents  . CAROTID STENT     denies carotid stent  . CATARACT EXTRACTION, BILATERAL    . CORONARY ARTERY BYPASS GRAFT  12/23/1999   LIMA to LAD, SVG to ramus, sequential SVG to OM2-RPL, sequential SVG to AM-PDA  . EP IMPLANTABLE DEVICE  N/A 05/18/2016   Procedure: ICD Implant;  Surgeon: Will Meredith Leeds, MD;  Location: [place] CV LAB;  Service: Cardiovascular;  Laterality: N/A;  . ESOPHAGOGASTRODUODENOSCOPY (EGD) WITH PROPOFOL N/A 10/02/2018   Procedure: ESOPHAGOGASTRODUODENOSCOPY (EGD) WITH PROPOFOL;  Surgeon: Daneil Dolin, MD;  [facility];  Service: Endoscopy;  Laterality: N/A;  . EYE SURGERY    . ingrown toenail N/A   . LEFT HEART CATHETERIZATION WITH CORONARY/GRAFT ANGIOGRAM N/A 12/21/2014   Procedure: LEFT HEART CATHETERIZATION WITH Beatrix Fetters;  Surgeon: Belva Crome, MD;  Location: [facility] CATH LAB;  Service: Cardiovascular;  Laterality: N/A;  . NASAL SEPTOPLASTY W/ TURBINOPLASTY Bilateral 04/25/2017   NASAL SEPTOPLASTY WITH TURBINATE REDUCTION/notes 04/25/2017  . NASAL SEPTOPLASTY W/ TURBINOPLASTY Bilateral 04/25/2017   Procedure: NASAL SEPTOPLASTY WITH TURBINATE REDUCTION;  Surgeon: Leta Baptist, MD;  Location: [place];  Service: ENT;  Laterality: Bilateral;  . RIGHT/LEFT HEART CATH AND CORONARY/GRAFT ANGIOGRAPHY N/A 10/05/2017   Procedure: RIGHT/LEFT HEART CATH AND CORONARY/GRAFT ANGIOGRAPHY;  Surgeon: Sherren Mocha, MD;  Location: [place] CV LAB;  Service: Cardiovascular;  Laterality: N/A;  . TEE WITHOUT CARDIOVERSION N/A 11/20/2017   Procedure: TRANSESOPHAGEAL ECHOCARDIOGRAM (TEE);  Surgeon: Sherren Mocha, MD;  Location: [place];  Service: Open Heart Surgery;  Laterality: N/A;  . TONSILLECTOMY    . TRANSCATHETER AORTIC VALVE REPLACEMENT, TRANSFEMORAL N/A 11/20/2017   Procedure: TRANSCATHETER AORTIC VALVE REPLACEMENT, TRANSFEMORAL;  Surgeon: Sherren Mocha, MD;  Location: [place];  Service: Open Heart Surgery;  Laterality: N/A;    Current Medications: Outpatient Medications Prior to Visit  Medication Sig Dispense Refill  . acetaminophen (TYLENOL) 500 MG tablet Take 500 mg by mouth every 6 (six) hours as needed.    . carvedilol (COREG) 3.125 MG tablet Take 1 tablet (3.125 mg total) by mouth 2  (two) times daily with a meal. 60 tablet 3  . HYDROcodone-acetaminophen (NORCO) 10-325 MG tablet One bid prn pain 60 tablet 0  . LEVEMIR FLEXTOUCH 100 UNIT/ML Pen INJECT 60 UNITS INTO THE SKIN EACH EVENING. MAY TITRATE UP TO 80 UNITS. 15 mL 5  . linaclotide (LINZESS) 290 MCG CAPS capsule Take 1 capsule (290 mcg total) by mouth daily before breakfast. 30 capsule 1  . liver oil-zinc oxide (DESITIN) 40 % ointment Apply 1 application topically as needed for irritation.    . metolazone (ZAROXOLYN) 2.5 MG tablet TAKE ONE TABLET BY MOUTH DAILY. (Patient taking differently: every other day. ) 30 tablet 3  . Multiple Vitamins-Minerals (CENTRUM SILVER PO) Take 1 tablet by mouth daily.     . nitroGLYCERIN (NITROSTAT) 0.4 MG SL tablet PLACE 1 TABLET UNDER TONGUE FOR CHEST PAIN. MAY REPEAT EVERY 5 MIN UPTO 3 DOSES-NO RELIEF,CALL 911. 25 tablet 6  . NOVOLOG FLEXPEN 100 UNIT/ML FlexPen INJECT 20 UNITS INTO THE SKIN THREE TIMES DAILY WITH MEALS. 15 mL 5  . pantoprazole (PROTONIX) 40 MG tablet Take 1 tablet (40  mg total) by mouth 2 (two) times daily. 60 tablet 3  . polycarbophil (FIBERCON) 625 MG tablet Take 2 tablets (1,250 mg total) by mouth daily with lunch. Hold if you have diarrhea 30 tablet 0  . Polyethyl Glycol-Propyl Glycol (SYSTANE) 0.4-0.3 % GEL ophthalmic gel Place 1 application into both eyes.    . potassium chloride SA (KLOR-CON M20) 20 MEQ tablet Take 3 qam, 3 at lunch, and 2 at supper 720 tablet 0  . Rivaroxaban (XARELTO) 15 MG TABS tablet Take 1 tablet (15 mg total) by mouth daily with supper. 30 tablet 5  . rosuvastatin (CRESTOR) 40 MG tablet Take 1 tablet (40 mg total) by mouth daily. 90 tablet 1  . simethicone (MYLICON) 40 BW/4.6KZ drops Take 40 mg by mouth 4 (four) times daily as needed for flatulence.    Marland Kitchen spironolactone (ALDACTONE) 25 MG tablet Take 1 tablet (25 mg total) by mouth daily. (Patient taking differently: Take 25 mg by mouth every other day. ) 90 tablet 3  . SURE COMFORT PEN NEEDLES  31G X 8 MM MISC USING 4 TIMES DAILY. 100 each 5  . torsemide (DEMADEX) 20 MG tablet 2 qam and 2 q 3pm (Patient taking differently: 3 qam and 2 q 3pm) 120 tablet 5  . trolamine salicylate (ASPERCREME) 10 % cream Apply 1 application topically 2 (two) times daily as needed for muscle pain.     . traMADol (ULTRAM) 50 MG tablet Take one po Q 6-8 prn headache. 20 tablet 0  . cefdinir (OMNICEF) 300 MG capsule Take 1 capsule (300 mg total) by mouth 2 (two) times daily. 20 capsule 0   No facility-administered medications prior to visit.      Allergies:   Dyflex-g [dyphylline-guaifenesin] and Eliquis [apixaban]   Social History   Socioeconomic History  . Marital status: Married    Spouse name: Not on file  . Number of children: Not on file  . Years of education: Not on file  . Highest education level: Not on file  Occupational History  . Occupation: retired    Comment: [place]: [employer]  . Financial resource strain: Patient refused  . Food insecurity:    Worry: Patient refused    Inability: Patient refused  . Transportation needs:    Medical: Patient refused    Non-medical: Patient refused  Tobacco Use  . Smoking status: Former Smoker    Last attempt to quit: 08/29/1999    Years since quitting: 19.1  . Smokeless tobacco: Never Used  . Tobacco comment: couldn't remember date  Substance and Sexual Activity  . Alcohol use: No    Alcohol/week: 0.0 standard drinks  . Drug use: No  . Sexual activity: Not Currently  Lifestyle  . Physical activity:    Days per week: Patient refused    Minutes per session: Patient refused  . Stress: Patient refused  Relationships  . Social connections:    Talks on phone: Patient refused    Gets together: Patient refused    Attends religious service: Patient refused    Active member of club or organization: Patient refused    Attends meetings of clubs or organizations: Patient refused    Relationship status:  Patient refused  Other Topics Concern  . Not on file  Social History Narrative  . Not on file     Family History:  The patient's family history includes CAD in his mother; Diabetes in his mother. He was adopted.  Review of Systems:   Please see the history of present illness.     General:  No chills, fever, night sweats or weight changes.  Cardiovascular:  No chest pain, dyspnea on exertion, orthopnea, palpitations, paroxysmal nocturnal dyspnea. Positive for lower extremity edema.  Dermatological: No rash, lesions/masses Respiratory: No cough, dyspnea Urologic: No hematuria, dysuria Abdominal:   No nausea, vomiting, diarrhea, bright red blood per rectum, melena, or hematemesis Neurologic:  No visual changes, wkns, changes in mental status. All other systems reviewed and are otherwise negative except as noted above.   Physical Exam:    VS:  BP 112/72   Pulse 86   Ht 5' 10.5" (1.791 m)   Wt 282 lb 3.2 oz (128 kg)   SpO2 97%   BMI 39.92 kg/m    General: Well developed, overweight Caucasian male appearing in no acute distress. Head: Normocephalic, atraumatic, sclera non-icteric, no xanthomas, nares are without discharge.  Neck: No carotid bruits. JVD not elevated.  Lungs: Respirations regular and unlabored, without wheezes or rales.  Heart: Regular rate and rhythm. No S3 or S4.  No murmur, no rubs, or gallops appreciated. Abdomen: Soft, non-tender, non-distended with normoactive bowel sounds. No hepatomegaly. No rebound/guarding. No obvious abdominal masses. Msk:  Strength and tone appear normal for age. No joint deformities or effusions. Extremities: No clubbing or cyanosis. 1+ pitting edema up to knees bilaterally.  Distal pedal pulses are 2+ bilaterally. Neuro: Alert and oriented X 3. Moves all extremities spontaneously. No focal deficits noted. Psych:  Responds to questions appropriately with a normal affect. Skin: No rashes or lesions noted  Wt Readings from Last 3  Encounters:  10/23/18 282 lb 3.2 oz (128 kg)  10/09/18 294 lb (133.4 kg)  10/01/18 274 lb 14.6 oz (124.7 kg)     Studies/Labs Reviewed:   EKG:  EKG is not ordered today.   Recent Labs: 09/12/2018: BNP 477.4 10/01/2018: ALT 11 10/02/2018: Magnesium 2.1 10/09/2018: BUN 24; Creatinine, Ser 1.37; Hemoglobin 10.1; Platelets 343; Potassium 3.1; Sodium 139   Lipid Panel    Component Value Date/Time   CHOL 146 06/05/2018 1547   TRIG 312 (H) 06/05/2018 1547   HDL 31 (L) 06/05/2018 1547   CHOLHDL 4.7 06/05/2018 1547   CHOLHDL 3.9 01/02/2015 1010   VLDL 31 01/02/2015 1010   LDLCALC 53 06/05/2018 1547    Additional studies/ records that were reviewed today include:   Cardiac Catheterization: 09/2017 1.  Severe three-vessel coronary artery disease with total occlusion of the RCA, total occlusion of the left circumflex, and total occlusion of the LAD just after the first diagonal 2.  Status post aortocoronary bypass surgery with patent grafts including LIMA to LAD, saphenous vein graft to ramus intermedius, saphenous vein graft to second obtuse marginal, and saphenous vein graft to right PDA 3.  Calcified, restricted aortic valve under plain fluoroscopy, with mean transvalvular gradient 27 mmHg  Recommendation continued evaluation for progressive aortic stenosis suspicious for low flow low gradient aortic stenosis, CT angiography of the heart and peripheral vessels will be ordered and multidisciplinary evaluation by cardiac surgery.  Echocardiogram: 12/2017 Study Conclusions  - Left ventricle: The cavity size was mildly dilated. Wall   thickness was increased in a pattern of mild LVH. LV EF 15-20%.   Diffuse hypokinesis. Features are consistent with a pseudonormal   left ventricular filling pattern, with concomitant abnormal   relaxation and increased filling pressure (grade 2 diastolic   dysfunction). - Aortic valve: Bioprosthetic aortic valve s/p TAVR. There  was no   regurgitation. No  significant bioprosthetic valve stenosis. Mean   gradient (S): 4 mm Hg. - Mitral valve: Mildly calcified annulus. There was trivial   regurgitation. - Left atrium: The atrium was severely dilated. - Right ventricle: The cavity size was mildly dilated. Pacer wire   or catheter noted in right ventricle. Systolic function was   moderately reduced. - Pulmonary arteries: No complete TR doppler jet so unable to   estimate PA systolic pressure. - Systemic veins: IVC measured 3 cm with < 50% respirophasic   variation, suggesting RA pressure 15 mmHg. - Pericardium, extracardiac: A trivial pericardial effusion was   identified.  Impressions:  - Technically difficult study with poor acoustic windows. Mildly   dilated LV with mild LV hypertrophy. EF 15-20%. Moderate   diastolic dysfunction. Mildly dilated RV with moderately   decreased systolic function. Bioprosthetic aortic valve s/p TAVR,   the valve appears to function normally.  Assessment:    1. Chronic combined systolic and diastolic heart failure (HCC)   2. Cardiomyopathy, ischemic   3. Coronary artery disease of native artery of native heart with stable angina pectoris (HCC)   4. Paroxysmal atrial fibrillation ([place])   5. S/P TAVR (transcatheter aortic valve replacement)   6. Essential hypertension   7. Hyperlipidemia LDL goal <70   8. CKD (chronic kidney disease) stage 3, GFR 30-59 ml/min (HCC)      Plan:   In order of problems listed above:  1. Chronic Combined Systolic and Diastolic CHF/Ischemic Cardiomyopathy - EF 15-20% by echo in 12/2017 and s/p ICD placement in 2017. He has been experiencing issues with lower extremity edema since hospital discharge but says symptoms continue to improve. Lungs are clear on examination but he does have pitting edema up to his knees bilaterally.  - Torsemide was recently increased by his PCP and he continues to experience lower extremity edema and notes that Torsemide was recently  increased by his PCP to 60mg  in AM and 40mg  in PM while taking Metolazone 2.5 mg every other day. Weight has continued to decline. Would continue current diuretic regimen for now. Remains on Coreg and Spironolactone. By review of notes, he has not been on ACE-I/ARB due to this causing variable renal function in the past. BP does not allow for the addition of this currently but would consider once fluid status has improved and renal function has stabilized.    2. CAD - s/p CABG in 2001 with LIMA-LAD, SVG-RI, seq SVG-OM2-RPL, and seq SVG-Mrg-PDA, s/p stenting of SVG-RI with patent grafts by cath in 09/2017.  - he denies any recent chest pain or dyspnea on exertion.  - remains on BB and statin therapy. Not on ASA given the need for anticoagulation.  3. PAF - he denies any recent palpitations. Remains on Coreg 3.125mg  BID for rate-control. - previously experienced GIB while on Plavix and Eliquis but was also using NSAIDS regularly at that time. Was transitioned to Xarelto as he refused to resume Eliquis. Denies any evidence of recurrent bleeding. Repeat labs being obtained by his PCP today.   4. Severe Aortic Stenosis - s/p TAVR in 10/2017. Due for repeat echocardiogram and follow-up with the Structural Heart Team in 10/2018.  5. HTN - BP is well-controlled at 112/72 during today's visit. - continue current medication regimen.   6. HLD - FLP in 05/2018 showed total cholesterol of 146, HDL 31, Triglycerides 312, and LDL 53. At goal of LDL < 70. Continue Crestor 40mg  daily.  7. Stage 3 CKD - creatinine at 1.37 when checked on 10/09/2018. Had repeat labs obtained by his PCP today. Results not yet available by review of Epic.    Medication Adjustments/Labs and Tests Ordered: Current medicines are reviewed at length with the patient today.  Concerns regarding medicines are outlined above.  Medication changes, Labs and Tests ordered today are listed in the Patient Instructions below. Patient  Instructions   Medication Instructions:  Your physician recommends that you continue on your current medications as directed. Please refer to the Current Medication list given to you today.  If you need a refill on your cardiac medications before your next appointment, please call your pharmacy.   Lab work: NONE  If you have labs (blood work) drawn today and your tests are completely normal, you will receive your results only by: Marland Kitchen MyChart Message (if you have MyChart) OR . A paper copy in the mail If you have any lab test that is abnormal or we need to change your treatment, we will call you to review the results.  Testing/Procedures: NONE   Follow-Up: At [facility], you and your health needs are our priority.  As part of our continuing mission to provide you with exceptional heart care, we have created designated Provider Care Teams.  These Care Teams include your primary Cardiologist (physician) and Advanced Practice Providers (APPs -  Physician Assistants and Nurse Practitioners) who all work together to provide you with the care you need, when you need it. You will need a follow up appointment in 3-4 months.  Please call our office 2 months in advance to schedule this appointment.  You may see Carlyle Dolly, MD or one of the following Advanced Practice Providers on your designated Care Team:   Bernerd Pho, PA-C [facility]) . Ermalinda Barrios, PA-C ([place])  Any Other Special Instructions Will Be Listed Below (If Applicable). Thank you for choosing [facility]!   Signed, Erma Heritage, PA-C  10/23/2018 7:37 PM    [place] [address] [place], [place] [address] Phone: [phone] Fax: [phone]

## 2018-10-23 ENCOUNTER — Encounter: Payer: Self-pay | Admitting: Student

## 2018-10-23 ENCOUNTER — Ambulatory Visit (INDEPENDENT_AMBULATORY_CARE_PROVIDER_SITE_OTHER): Payer: Medicare Other | Admitting: Student

## 2018-10-23 VITALS — BP 112/72 | HR 86 | Ht 70.5 in | Wt 282.2 lb

## 2018-10-23 DIAGNOSIS — N183 Chronic kidney disease, stage 3 unspecified: Secondary | ICD-10-CM

## 2018-10-23 DIAGNOSIS — E785 Hyperlipidemia, unspecified: Secondary | ICD-10-CM

## 2018-10-23 DIAGNOSIS — Z952 Presence of prosthetic heart valve: Secondary | ICD-10-CM

## 2018-10-23 DIAGNOSIS — I5042 Chronic combined systolic (congestive) and diastolic (congestive) heart failure: Secondary | ICD-10-CM | POA: Diagnosis not present

## 2018-10-23 DIAGNOSIS — I255 Ischemic cardiomyopathy: Secondary | ICD-10-CM | POA: Diagnosis not present

## 2018-10-23 DIAGNOSIS — I10 Essential (primary) hypertension: Secondary | ICD-10-CM | POA: Diagnosis not present

## 2018-10-23 DIAGNOSIS — E876 Hypokalemia: Secondary | ICD-10-CM | POA: Diagnosis not present

## 2018-10-23 DIAGNOSIS — D649 Anemia, unspecified: Secondary | ICD-10-CM | POA: Diagnosis not present

## 2018-10-23 DIAGNOSIS — I480 Paroxysmal atrial fibrillation: Secondary | ICD-10-CM

## 2018-10-23 DIAGNOSIS — I25118 Atherosclerotic heart disease of native coronary artery with other forms of angina pectoris: Secondary | ICD-10-CM

## 2018-10-23 DIAGNOSIS — N289 Disorder of kidney and ureter, unspecified: Secondary | ICD-10-CM | POA: Diagnosis not present

## 2018-10-23 NOTE — Patient Instructions (Addendum)
Medication Instructions:  Your physician recommends that you continue on your current medications as directed. Please refer to the Current Medication list given to you today.  If you need a refill on your cardiac medications before your next appointment, please call your pharmacy.   Lab work: NONE  If you have labs (blood work) drawn today and your tests are completely normal, you will receive your results only by: Marland Kitchen MyChart Message (if you have MyChart) OR . A paper copy in the mail If you have any lab test that is abnormal or we need to change your treatment, we will call you to review the results.  Testing/Procedures: NONE   Follow-Up: At [facility], you and your health needs are our priority.  As part of our continuing mission to provide you with exceptional heart care, we have created designated Provider Care Teams.  These Care Teams include your primary Cardiologist (physician) and Advanced Practice Providers (APPs -  Physician Assistants and Nurse Practitioners) who all work together to provide you with the care you need, when you need it. You will need a follow up appointment in 3-4 months.  Please call our office 2 months in advance to schedule this appointment.  You may see Carlyle Dolly, MD or one of the following Advanced Practice Providers on your designated Care Team:   Bernerd Pho, PA-C [facility]) . Ermalinda Barrios, PA-C ([facility])  Any Other Special Instructions Will Be Listed Below (If Applicable). Thank you for choosing [facility]!     Two Gram Sodium Diet 2000 mg  What is Sodium? Sodium is a mineral found naturally in many foods. The most significant source of sodium in the diet is table salt, which is about 40% sodium.  Processed, convenience, and preserved foods also contain a large amount of sodium.  The body needs only 500 mg of sodium daily to function,  A normal diet provides more than enough sodium even if you do not use  salt.  Why Limit Sodium? A build up of sodium in the body can cause thirst, increased blood pressure, shortness of breath, and water retention.  Decreasing sodium in the diet can reduce edema and risk of heart attack or stroke associated with high blood pressure.  Keep in mind that there are many other factors involved in these health problems.  Heredity, obesity, lack of exercise, cigarette smoking, stress and what you eat all play a role.  General Guidelines:  Do not add salt at the table or in cooking.  One teaspoon of salt contains over 2 grams of sodium.  Read food labels  Avoid processed and convenience foods  Ask your dietitian before eating any foods not dicussed in the menu planning guidelines  Consult your physician if you wish to use a salt substitute or a sodium containing medication such as antacids.  Limit milk and milk products to 16 oz (2 cups) per day.  Shopping Hints:  READ LABELS!! "Dietetic" does not necessarily mean low sodium.  Salt and other sodium ingredients are often added to foods during processing.   Menu Planning Guidelines Food Group Choose More Often Avoid  Beverages (see also the milk group All fruit juices, low-sodium, salt-free vegetables juices, low-sodium carbonated beverages Regular vegetable or tomato juices, commercially softened water used for drinking or cooking  Breads and Cereals Enriched white, wheat, rye and pumpernickel bread, hard rolls and dinner rolls; muffins, cornbread and waffles; most dry cereals, cooked cereal without added salt; unsalted crackers and breadsticks;  low sodium or homemade bread crumbs Bread, rolls and crackers with salted tops; quick breads; instant hot cereals; pancakes; commercial bread stuffing; self-rising flower and biscuit mixes; regular bread crumbs or cracker crumbs  Desserts and Sweets Desserts and sweets mad with mild should be within allowance Instant pudding mixes and cake mixes  Fats Butter or margarine;  vegetable oils; unsalted salad dressings, regular salad dressings limited to 1 Tbs; light, sour and heavy cream Regular salad dressings containing bacon fat, bacon bits, and salt pork; snack dips made with instant soup mixes or processed cheese; salted nuts  Fruits Most fresh, frozen and canned fruits Fruits processed with salt or sodium-containing ingredient (some dried fruits are processed with sodium sulfites        Vegetables Fresh, frozen vegetables and low- sodium canned vegetables Regular canned vegetables, sauerkraut, pickled vegetables, and others prepared in brine; frozen vegetables in sauces; vegetables seasoned with ham, bacon or salt pork  Condiments, Sauces, Miscellaneous  Salt substitute with physician's approval; pepper, herbs, spices; vinegar, lemon or lime juice; hot pepper sauce; garlic powder, onion powder, low sodium soy sauce (1 Tbs.); low sodium condiments (ketchup, chili sauce, mustard) in limited amounts (1 tsp.) fresh ground horseradish; unsalted tortilla chips, pretzels, potato chips, popcorn, salsa (1/4 cup) Any seasoning made with salt including garlic salt, celery salt, onion salt, and seasoned salt; sea salt, rock salt, kosher salt; meat tenderizers; monosodium glutamate; mustard, regular soy sauce, barbecue, sauce, chili sauce, teriyaki sauce, steak sauce, Worcestershire sauce, and most flavored vinegars; canned gravy and mixes; regular condiments; salted snack foods, olives, picles, relish, horseradish sauce, catsup   Food preparation: Try these seasonings Meats:    Pork Sage, onion Serve with applesauce  Chicken Poultry seasoning, thyme, parsley Serve with cranberry sauce  Lamb Curry powder, rosemary, garlic, thyme Serve with mint sauce or jelly  Veal Marjoram, basil Serve with current jelly, cranberry sauce  Beef Pepper, bay leaf Serve with dry mustard, unsalted chive butter  Fish [place] leaf, dill Serve with unsalted lemon butter, unsalted parsley butter   Vegetables:    Asparagus Lemon juice   Broccoli Lemon juice   Carrots Mustard dressing parsley, mint, nutmeg, glazed with unsalted butter and sugar   Green beans Marjoram, lemon juice, nutmeg,dill seed   Tomatoes Basil, marjoram, onion   Spice /blend for [employer]" 4 tsp ground thyme 1 tsp ground sage 3 tsp ground rosemary 4 tsp ground marjoram   Test your knowledge 1. A product that says "Salt Free" may still contain sodium. True or False 2. Garlic Powder and Hot Pepper Sauce an be used as alternative seasonings.True or False 3. Processed foods have more sodium than fresh foods.  True or False 4. Canned Vegetables have less sodium than froze True or False  WAYS TO DECREASE YOUR SODIUM INTAKE 1. Avoid the use of added salt in cooking and at the table.  Table salt (and other prepared seasonings which contain salt) is probably one of the greatest sources of sodium in the diet.  Unsalted foods can gain flavor from the sweet, sour, and butter taste sensations of herbs and spices.  Instead of using salt for seasoning, try the following seasonings with the foods listed.  Remember: how you use them to enhance natural food flavors is limited only by your creativity... Allspice-Meat, fish, eggs, fruit, peas, red and yellow vegetables Almond Extract-Fruit baked goods Anise Seed-Sweet breads, fruit, carrots, beets, cottage cheese, cookies (tastes like licorice) Basil-Meat, fish, eggs, vegetables, rice, vegetables salads, soups, sauces  Bay Leaf-Meat, fish, stews, poultry Burnet-Salad, vegetables (cucumber-like flavor) Caraway Seed-Bread, cookies, cottage cheese, meat, vegetables, cheese, rice Cardamon-Baked goods, fruit, soups Celery Powder or seed-Salads, salad dressings, sauces, meatloaf, soup, bread.Do not use  celery salt Chervil-Meats, salads, fish, eggs, vegetables, cottage cheese (parsley-like flavor) Chili Power-Meatloaf, chicken cheese, corn, eggplant, egg dishes Chives-Salads cottage  cheese, egg dishes, soups, vegetables, sauces Cilantro-Salsa, casseroles Cinnamon-Baked goods, fruit, pork, lamb, chicken, carrots Cloves-Fruit, baked goods, fish, pot roast, green beans, beets, carrots Coriander-Pastry, cookies, meat, salads, cheese (lemon-orange flavor) Cumin-Meatloaf, fish,cheese, eggs, cabbage,fruit pie (caraway flavor) [employer], fruit, eggs, fish, poultry, cottage cheese, vegetables Dill Seed-Meat, cottage cheese, poultry, vegetables, fish, salads, bread Fennel Seed-Bread, cookies, apples, pork, eggs, fish, beets, cabbage, cheese, Licorice-like flavor Garlic-(buds or powder) Salads, meat, poultry, fish, bread, butter, vegetables, potatoes.Do not  use garlic salt Ginger-Fruit, vegetables, baked goods, meat, fish, poultry Horseradish Root-Meet, vegetables, butter Lemon Juice or Extract-Vegetables, fruit, tea, baked goods, fish salads Mace-Baked goods fruit, vegetables, fish, poultry (taste like nutmeg) Maple Extract-Syrups Marjoram-Meat, chicken, fish, vegetables, breads, green salads (taste like Sage) Mint-Tea, lamb, sherbet, vegetables, desserts, carrots, cabbage Mustard, Dry or Seed-Cheese, eggs, meats, vegetables, poultry Nutmeg-Baked goods, fruit, chicken, eggs, vegetables, desserts Onion Powder-Meat, fish, poultry, vegetables, cheese, eggs, bread, rice salads (Do not use   Onion salt) Orange Extract-Desserts, baked goods Oregano-Pasta, eggs, cheese, onions, pork, lamb, fish, chicken, vegetables, green salads Paprika-Meat, fish, poultry, eggs, cheese, vegetables Parsley Flakes-Butter, vegetables, meat fish, poultry, eggs, bread, salads (certain forms may   Contain sodium Pepper-Meat fish, poultry, vegetables, eggs Peppermint Extract-Desserts, baked goods Poppy Seed-Eggs, bread, cheese, fruit dressings, baked goods, noodles, vegetables, cottage  [employer], poultry, meat, fish, cauliflower, turnips,eggs  bread Saffron-Rice, bread, veal, chicken, fish, eggs Sage-Meat, fish, poultry, onions, eggplant, tomateos, pork, stews Savory-Eggs, salads, poultry, meat, rice, vegetables, soups, pork Tarragon-Meat, poultry, fish, eggs, butter, vegetables (licorice-like flavor)  Thyme-Meat, poultry, fish, eggs, vegetables, (clover-like flavor), sauces, soups Tumeric-Salads, butter, eggs, fish, rice, vegetables (saffron-like flavor) Vanilla Extract-Baked goods, candy Vinegar-Salads, vegetables, meat marinades Walnut Extract-baked goods, candy  2. Choose your Foods Wisely   The following is a list of foods to avoid which are high in sodium:  Meats-Avoid all smoked, canned, salt cured, dried and kosher meat and fish as well as Anchovies   [employer] meats:Bologna, Liverwurst, Pastrami Canned meat or fish  Marinated herring Caviar    Pepperoni Corned Beef   Pizza Dried chipped beef  Salami Frozen breaded fish or meat Salt pork Frankfurters or hot dogs  Sardines Gefilte fish   Sausage Ham (boiled ham, Proscuitto Smoked butt    spiced ham)   Spam      TV Dinners Vegetables Canned vegetables (Regular) Relish Canned mushrooms  Sauerkraut Olives    Tomato juice Pickles  Bakery and Dessert Products Canned puddings  Cream pies Cheesecake   Decorated cakes Cookies  Beverages/Juices Tomato juice, regular  Gatorade   V-8 vegetable juice, regular  Breads and Cereals Biscuit mixes   Salted potato chips, corn chips, pretzels Bread stuffing mixes  Salted crackers and rolls Pancake and waffle mixes Self-rising flour  Seasonings Accent    Meat sauces Barbecue sauce  Meat tenderizer Catsup    Monosodium glutamate (MSG) Celery salt   Onion salt Chili sauce   Prepared mustard Garlic salt   Salt, seasoned salt, sea salt Gravy mixes   Soy sauce Horseradish   Steak sauce Ketchup   Tartar sauce Lite salt    Teriyaki sauce  Marinade mixes   Worcestershire sauce  Others Baking  powder   Cocoa and cocoa mixes Baking soda   Commercial casserole mixes Candy-caramels, chocolate  Dehydrated soups    Bars, fudge,nougats  Instant rice and pasta mixes Canned broth or soup  Maraschino cherries Cheese, aged and processed cheese and cheese spreads  Learning Assessment Quiz  Indicated T (for True) or F (for False) for each of the following statements:  1. _____ Fresh fruits and vegetables and unprocessed grains are generally low in sodium 2. _____ Water may contain a considerable amount of sodium, depending on the source 3. _____ You can always tell if a food is high in sodium by tasting it 4. _____ Certain laxatives my be high in sodium and should be avoided unless prescribed   by a physician or pharmacist 5. _____ Salt substitutes may be used freely by anyone on a sodium restricted diet 6. _____ Sodium is present in table salt, food additives and as a natural component of   most foods 7. _____ Table salt is approximately 90% sodium 8. _____ Limiting sodium intake may help prevent excess fluid accumulation in the body 9. _____ On a sodium-restricted diet, seasonings such as bouillon soy sauce, and    cooking wine should be used in place of table salt 10. _____ On an ingredient list, a product which lists monosodium glutamate as the first   ingredient is an appropriate food to include on a low sodium diet  Circle the best answer(s) to the following statements (Hint: there may be more than one correct answer)  11. On a low-sodium diet, some acceptable snack items are:    A. Olives  F. Bean dip   K. Grapefruit juice    B. Salted Pretzels G. Commercial Popcorn   L. Canned peaches    C. Carrot Sticks  H. Bouillon   M. Unsalted nuts   D. [place] fries  I. Peanut butter crackers N. Salami   E. Sweet pickles J. Tomato Juice   O. Pizza  12.  Seasonings that may be used freely on a reduced - sodium diet include   A. Lemon wedges F.Monosodium glutamate K. Celery  seed    B.Soysauce   G. Pepper   L. Mustard powder   C. Sea salt  H. Cooking wine  M. Onion flakes   D. Vinegar  E. Prepared horseradish N. Salsa   E. Sage   J. Worcestershire sauce  O. Chutney

## 2018-10-24 LAB — BASIC METABOLIC PANEL
BUN/Creatinine Ratio: 23 (ref 10–24)
BUN: 30 mg/dL — ABNORMAL HIGH (ref 8–27)
CO2: 30 mmol/L — ABNORMAL HIGH (ref 20–29)
CREATININE: 1.32 mg/dL — AB (ref 0.76–1.27)
Calcium: 9.2 mg/dL (ref 8.6–10.2)
Chloride: 92 mmol/L — ABNORMAL LOW (ref 96–106)
GFR calc Af Amer: 64 mL/min/{1.73_m2} (ref >59)
GFR calc non Af Amer: 55 mL/min/{1.73_m2} — ABNORMAL LOW (ref >59)
Glucose: 156 mg/dL — ABNORMAL HIGH (ref 65–99)
Potassium: 3.6 mmol/L (ref 3.5–5.2)
Sodium: 140 mmol/L (ref 134–144)

## 2018-10-24 LAB — PHOSPHORUS: PHOSPHORUS: 3.4 mg/dL (ref 2.8–4.1)

## 2018-10-24 LAB — FERRITIN: Ferritin: 36 ng/mL (ref 30–400)

## 2018-10-24 LAB — IRON AND TIBC
Iron Saturation: 7 % — CL (ref 15–55)
Iron: 31 ug/dL — ABNORMAL LOW (ref 38–169)
Total Iron Binding Capacity: 446 ug/dL (ref 250–450)
UIBC: 415 ug/dL — ABNORMAL HIGH (ref 111–343)

## 2018-10-24 LAB — PTH, INTACT AND CALCIUM: PTH: 41 pg/mL (ref 15–65)

## 2018-10-24 LAB — VITAMIN B12: Vitamin B-12: 999 pg/mL (ref 232–1245)

## 2018-10-24 LAB — MAGNESIUM: Magnesium: 2.1 mg/dL (ref 1.6–2.3)

## 2018-10-24 LAB — VITAMIN D 25 HYDROXY (VIT D DEFICIENCY, FRACTURES): Vit D, 25-Hydroxy: 30.5 ng/mL (ref 30.0–100.0)

## 2018-10-29 ENCOUNTER — Other Ambulatory Visit: Payer: Self-pay | Admitting: Family Medicine

## 2018-10-29 ENCOUNTER — Telehealth: Payer: Self-pay | Admitting: *Deleted

## 2018-10-29 ENCOUNTER — Other Ambulatory Visit: Payer: Self-pay | Admitting: *Deleted

## 2018-10-29 MED ORDER — ACETAMINOPHEN 500 MG PO TABS: 500.0000 mg | ORAL_TABLET | Freq: Two times a day (BID) | ORAL | 0 refills | Status: AC

## 2018-10-29 MED ORDER — DOXYCYCLINE HYCLATE 100 MG PO TABS: ORAL_TABLET | ORAL | 0 refills | Status: DC

## 2018-10-29 NOTE — Telephone Encounter (Signed)
1.  It is impossible over the phone to know for certain what is causing the headaches-this is not a typical side effect  #2 the dizzy spells with standing up could be related into blood pressure dropping  #3 in order to be thorough on this typically what would need to be done is an office visit so therefore I recommend setting up an office visit if he has concerns regarding this we should be able to fit him in this week with me

## 2018-10-29 NOTE — Telephone Encounter (Signed)
Called pt and he states since starting on eliquis (which has been changed to xarelto) he has headaches that feel like somebody pushing on his brain that last about 30 seconds to one minute and then goes away. Happens when he first lays down and when sitting up too fast and also when bending over.

## 2018-10-30 NOTE — Telephone Encounter (Signed)
Patient stated he had a death in his family and will wait and discuss at his office visit 11/06/2018

## 2018-10-31 ENCOUNTER — Ambulatory Visit: Payer: Medicare Other | Admitting: Family Medicine

## 2018-11-01 NOTE — Telephone Encounter (Signed)
Patient informed all information will be discussed at appt on 11/06/18.

## 2018-11-06 ENCOUNTER — Other Ambulatory Visit: Payer: Self-pay

## 2018-11-06 ENCOUNTER — Ambulatory Visit (INDEPENDENT_AMBULATORY_CARE_PROVIDER_SITE_OTHER): Payer: Medicare Other | Admitting: Family Medicine

## 2018-11-06 VITALS — BP 128/84 | Ht 70.5 in | Wt 286.6 lb

## 2018-11-06 DIAGNOSIS — I10 Essential (primary) hypertension: Secondary | ICD-10-CM | POA: Diagnosis not present

## 2018-11-06 DIAGNOSIS — I951 Orthostatic hypotension: Secondary | ICD-10-CM | POA: Diagnosis not present

## 2018-11-06 DIAGNOSIS — I255 Ischemic cardiomyopathy: Secondary | ICD-10-CM | POA: Diagnosis not present

## 2018-11-06 DIAGNOSIS — K921 Melena: Secondary | ICD-10-CM | POA: Diagnosis not present

## 2018-11-06 DIAGNOSIS — N289 Disorder of kidney and ureter, unspecified: Secondary | ICD-10-CM

## 2018-11-06 LAB — IFOBT (OCCULT BLOOD): IFOBT: POSITIVE

## 2018-11-06 NOTE — Progress Notes (Signed)
   Subjective:    Patient ID: [name] [name], male    DOB: 12-17-1950, 68 y.o.   MRN: [id number]  HPI  Patient arrive for a follow up on his blood pressure and recent hospitalization for GI bleed Patient was in the hospital for upper GI bleed had a EGD has a follow-up with them in the coming weeks he denies any blood in the stools.  Does get little lightheaded when he stands up.  At times finds himself feeling somewhat woozy or dizzy Results for orders placed or performed in visit on 11/06/18  IFOBT POC (occult bld, rslt in office)  Result Value Ref Range   IFOBT Positive       Review of Systems  Constitutional: Negative for activity change.  HENT: Negative for congestion and rhinorrhea.   Respiratory: Negative for cough and shortness of breath.   Cardiovascular: Negative for chest pain.  Gastrointestinal: Negative for abdominal pain, diarrhea, nausea and vomiting.  Genitourinary: Negative for dysuria and hematuria.  Neurological: Negative for weakness and headaches.  Psychiatric/Behavioral: Negative for behavioral problems and confusion.       Objective:   Physical Exam Vitals signs reviewed.  Cardiovascular:     Rate and Rhythm: Normal rate and regular rhythm.     Heart sounds: Normal heart sounds. No murmur.  Pulmonary:     Effort: Pulmonary effort is normal.     Breath sounds: Normal breath sounds.  Lymphadenopathy:     Cervical: No cervical adenopathy.  Neurological:     Mental Status: He is alert.  Psychiatric:        Behavior: Behavior normal.    Patient occasionally gets lightheaded occasionally gets a headache       Assessment & Plan:  Continue Iron  Do Met 7 in 2 weeks  See gastroenterology they may need to consider colonoscopy for this patient  Mild orthostasis recommend decreasing the diuretic only take metolazone 3 times a week recommend the torsemide to be 2 in the morning 2 at lunch  Follow-up within 6 weeks

## 2018-11-10 ENCOUNTER — Other Ambulatory Visit: Payer: Self-pay | Admitting: Family Medicine

## 2018-11-11 NOTE — Telephone Encounter (Signed)
3 days after completed Ears still stopped up and right ear is draining. Pt states he is also having headaches.Pt states he would like a refill to see if it will clear up if not we will need to go another route. Please advise. Thank you

## 2018-11-13 ENCOUNTER — Telehealth: Payer: Self-pay | Admitting: Family Medicine

## 2018-11-13 ENCOUNTER — Encounter: Payer: Self-pay | Admitting: *Deleted

## 2018-11-13 ENCOUNTER — Encounter: Payer: Self-pay | Admitting: Gastroenterology

## 2018-11-13 ENCOUNTER — Ambulatory Visit (INDEPENDENT_AMBULATORY_CARE_PROVIDER_SITE_OTHER): Payer: Medicare Other | Admitting: Gastroenterology

## 2018-11-13 ENCOUNTER — Other Ambulatory Visit: Payer: Self-pay | Admitting: *Deleted

## 2018-11-13 ENCOUNTER — Other Ambulatory Visit: Payer: Self-pay

## 2018-11-13 ENCOUNTER — Telehealth: Payer: Self-pay | Admitting: *Deleted

## 2018-11-13 VITALS — BP 112/65 | HR 84 | Temp 98.3°F | Ht 71.0 in | Wt 283.0 lb

## 2018-11-13 DIAGNOSIS — I255 Ischemic cardiomyopathy: Secondary | ICD-10-CM | POA: Diagnosis not present

## 2018-11-13 DIAGNOSIS — D500 Iron deficiency anemia secondary to blood loss (chronic): Secondary | ICD-10-CM

## 2018-11-13 DIAGNOSIS — K250 Acute gastric ulcer with hemorrhage: Secondary | ICD-10-CM | POA: Diagnosis not present

## 2018-11-13 DIAGNOSIS — D509 Iron deficiency anemia, unspecified: Secondary | ICD-10-CM | POA: Insufficient documentation

## 2018-11-13 DIAGNOSIS — R195 Other fecal abnormalities: Secondary | ICD-10-CM

## 2018-11-13 MED ORDER — PEG 3350-KCL-NA BICARB-NACL 420 G PO SOLR: 4000.0000 mL | Freq: Once | ORAL | 0 refills | Status: AC

## 2018-11-13 NOTE — Telephone Encounter (Signed)
Patient states he would like to take the Doxycycline first and see if it helps firs before getting an appointment to see ENT. Patient states he will start medicine today and call back if he would like to see the ENT.

## 2018-11-13 NOTE — Telephone Encounter (Signed)
Pre-op scheduled for 5/7 at 11:00am. Letter mailed. LMTCB

## 2018-11-13 NOTE — Telephone Encounter (Signed)
Pt is wanting to make sure doxycycline is the correct thing he is suppose to be taking for his left ear stopping up and right ear leaking.   He also wants to know why his ears keep doing this.

## 2018-11-13 NOTE — Progress Notes (Signed)
Primary Care Physician: Kathyrn Drown, MD  Primary Gastroenterologist:  Barney Drain, MD   Chief Complaint  Patient presents with  . UGI    f/u. Has not noticed any blood in stool, no dark stools    HPI: [name] [name] is a 68 y.o. male here for hospital follow-up.  Patient has multiple comorbidities including AICD, CAD that is post CABG, A. fib, aortic stenosis status post transcatheter aortic valve replacement in March 2019, morbid obesity, sleep apnea.  He was seen on October 01, 2018 he presented to the emergency department with abdominal pain, black stools, coffee-ground emesis.  Hemoglobin was 9, heme positive.  On Plavix chronically and had also been on Eliquis.  Due to intolerances to Eliquis he was switched over to Xarelto by his PCP but that had not yet been started at time of admission.  EGD performed on February 5, he had mild Schatzki ring at the GE junction, small hiatal hernia, one nonbleeding cratered gastric ulcer, 12 mm in size.  Surrounding mucosal deformity with numerous erosions.  pathology revealed reactive gastropathy but no H. pylori.  Recommended 80-month follow-up EGD.  Findings felt to be NSAID related.  Patient was started on Linzess 290 mcg daily during hospitalization for constipation.  Patient is followed up with PCP since admission.  On February 12 his hemoglobin was 10.1 (1 week after hospitalization).  Iron low at 31, iron saturations low at 7%, TIBC 446, ferritin, 36, B12 normal.  Hemoccult positive.  Patient states his constipation is much better on Linzess 290 mcg daily.  He also takes FiberCon twice daily.  He denies any melena or rectal bleeding.  His appetite is good.  No heartburn.  No dysphagia.  No abdominal pain.   Current Outpatient Medications  Medication Sig Dispense Refill  . acetaminophen (TYLENOL) 500 MG tablet Take 1 tablet (500 mg total) by mouth 2 (two) times daily. (Patient taking differently: Take 500 mg by mouth as needed. )  14 tablet 0  . carvedilol (COREG) 3.125 MG tablet Take 1 tablet (3.125 mg total) by mouth 2 (two) times daily with a meal. 60 tablet 3  . Ferrous Gluconate (IRON 27 PO) Take by mouth daily.    Marland Kitchen HYDROcodone-acetaminophen (NORCO) 10-325 MG tablet One bid prn pain 60 tablet 0  . LEVEMIR FLEXTOUCH 100 UNIT/ML Pen INJECT 60 UNITS INTO THE SKIN EACH EVENING. MAY TITRATE UP TO 80 UNITS. 15 mL 5  . linaclotide (LINZESS) 290 MCG CAPS capsule Take 1 capsule (290 mcg total) by mouth daily before breakfast. 30 capsule 1  . liver oil-zinc oxide (DESITIN) 40 % ointment Apply 1 application topically as needed for irritation.    . metolazone (ZAROXOLYN) 2.5 MG tablet TAKE ONE TABLET BY MOUTH DAILY. (Patient taking differently: every other day. ) 30 tablet 3  . Multiple Vitamins-Minerals (CENTRUM SILVER PO) Take 1 tablet by mouth daily.     . nitroGLYCERIN (NITROSTAT) 0.4 MG SL tablet PLACE 1 TABLET UNDER TONGUE FOR CHEST PAIN. MAY REPEAT EVERY 5 MIN UPTO 3 DOSES-NO RELIEF,CALL 911. 25 tablet 6  . NOVOLOG FLEXPEN 100 UNIT/ML FlexPen INJECT 20 UNITS INTO THE SKIN THREE TIMES DAILY WITH MEALS. (Patient taking differently: 60 Units 2 (two) times daily. ) 15 mL 5  . pantoprazole (PROTONIX) 40 MG tablet Take 1 tablet (40 mg total) by mouth 2 (two) times daily. 60 tablet 3  . polycarbophil (FIBERCON) 625 MG tablet Take 2 tablets (1,250 mg total) by mouth daily  with lunch. Hold if you have diarrhea (Patient taking differently: 3 tabs in the morning, 3 at noon and 2 in evening) 30 tablet 0  . Polyethyl Glycol-Propyl Glycol (SYSTANE) 0.4-0.3 % GEL ophthalmic gel Place 1 application into both eyes.    . potassium chloride SA (KLOR-CON M20) 20 MEQ tablet Take 3 qam, 3 at lunch, and 2 at supper 720 tablet 0  . Rivaroxaban (XARELTO) 15 MG TABS tablet Take 1 tablet (15 mg total) by mouth daily with supper. 30 tablet 5  . rosuvastatin (CRESTOR) 40 MG tablet Take 1 tablet (40 mg total) by mouth daily. 90 tablet 1  . Simethicone  (GAS-X PO) Take by mouth as needed.    Marland Kitchen spironolactone (ALDACTONE) 25 MG tablet Take 1 tablet (25 mg total) by mouth daily. (Patient taking differently: Take 25 mg by mouth every other day. ) 90 tablet 3  . SURE COMFORT PEN NEEDLES 31G X 8 MM MISC USING 4 TIMES DAILY. 100 each 5  . torsemide (DEMADEX) 20 MG tablet 2 qam and 2 q 3pm (Patient taking differently: 3 qam and 3 q 3pm) 120 tablet 5  . traMADol (ULTRAM) 50 MG tablet Take one po Q 6-8 prn headache. 20 tablet 0  . trolamine salicylate (ASPERCREME) 10 % cream Apply 1 application topically 2 (two) times daily as needed for muscle pain.      No current facility-administered medications for this visit.     Allergies as of 11/13/2018 - Review Complete 11/13/2018  Allergen Reaction Noted  . Dyflex-g [dyphylline-guaifenesin] Hives 05/03/2013  . Eliquis [apixaban] Other (See Comments) 09/29/2018   Past Medical History:  Diagnosis Date  . AICD (automatic cardioverter/defibrillator) present 05/18/2016  . Anginal pain ([place])    occ none recent  . Aortic stenosis   . Arthritis   . CAD (coronary artery disease)   . Cardiomyopathy, ischemic   . CHF (congestive heart failure) ([place])   . Chronic back pain   . Diabetes mellitus without complication ([place])   . Elevated PSA   . History of kidney stones   . Hyperlipidemia   . Morbid obesity ([place])   . Myocardial infarction ([place])   . OSA (obstructive sleep apnea)    does not wear CPAP, -order run out told need to redo  test  . Paroxysmal atrial fibrillation ([place]) 09/29/2018   This was found on event monitor October 2019  . 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 APPENDECTOMY    . BIOPSY  10/02/2018   Procedure:  BIOPSY;  Surgeon: Daneil Dolin, MD;  [facility];  Service: Endoscopy;;  gastric  . CARDIAC CATHETERIZATION     stents  . CAROTID STENT     denies carotid stent  . CATARACT EXTRACTION, BILATERAL    . CORONARY ARTERY BYPASS GRAFT  12/23/1999   LIMA to LAD, SVG to ramus, sequential SVG to OM2-RPL, sequential SVG to AM-PDA  . EP IMPLANTABLE DEVICE N/A 05/18/2016   Procedure: ICD Implant;  Surgeon: Will Meredith Leeds, MD;  Location: [facility];  Service: Cardiovascular;  Laterality: N/A;  . ESOPHAGOGASTRODUODENOSCOPY (EGD) WITH PROPOFOL N/A 10/02/2018   Procedure: ESOPHAGOGASTRODUODENOSCOPY (EGD) WITH PROPOFOL;  Surgeon: Manus Rudd  M, MD;  [facility];  Service: Endoscopy;  Laterality: N/A;  . EYE SURGERY    . ingrown toenail N/A   . LEFT HEART CATHETERIZATION WITH CORONARY/GRAFT ANGIOGRAM N/A 12/21/2014   Procedure: LEFT HEART CATHETERIZATION WITH Beatrix Fetters;  Surgeon: Belva Crome, MD;  Location: [facility] CATH LAB;  Service: Cardiovascular;  Laterality: N/A;  . NASAL SEPTOPLASTY W/ TURBINOPLASTY Bilateral 04/25/2017   NASAL SEPTOPLASTY WITH TURBINATE REDUCTION/notes 04/25/2017  . NASAL SEPTOPLASTY W/ TURBINOPLASTY Bilateral 04/25/2017   Procedure: NASAL SEPTOPLASTY WITH TURBINATE REDUCTION;  Surgeon: Leta Baptist, MD;  Location: [facility];  Service: ENT;  Laterality: Bilateral;  . RIGHT/LEFT HEART CATH AND CORONARY/GRAFT ANGIOGRAPHY N/A 10/05/2017   Procedure: RIGHT/LEFT HEART CATH AND CORONARY/GRAFT ANGIOGRAPHY;  Surgeon: Sherren Mocha, MD;  Location: [facility];  Service: Cardiovascular;  Laterality: N/A;  . TEE WITHOUT CARDIOVERSION N/A 11/20/2017   Procedure: TRANSESOPHAGEAL ECHOCARDIOGRAM (TEE);  Surgeon: Sherren Mocha, MD;  Location: [facility];  Service: Open Heart Surgery;  Laterality: N/A;  . TONSILLECTOMY    . TRANSCATHETER AORTIC VALVE REPLACEMENT, TRANSFEMORAL N/A 11/20/2017   Procedure: TRANSCATHETER AORTIC VALVE REPLACEMENT, TRANSFEMORAL;   Surgeon: Sherren Mocha, MD;  Location: [facility];  Service: Open Heart Surgery;  Laterality: N/A;   Family History  Adopted: Yes  Problem Relation Age of Onset  . Diabetes Mother   . CAD Mother   . Colon cancer Neg Hx   . Colon polyps Neg Hx    Social History   Tobacco Use  . Smoking status: Former Smoker    Last attempt to quit: 08/29/1999    Years since quitting: 19.2  . Smokeless tobacco: Never Used  . Tobacco comment: couldn't remember date  Substance Use Topics  . Alcohol use: No    Alcohol/week: 0.0 standard drinks  . Drug use: No     ROS:  General: Negative for anorexia, weight loss, fever, chills, fatigue, weakness. ENT: Negative for hoarseness, difficulty swallowing , nasal congestion. CV: Negative for chest pain, angina, palpitations, dyspnea on exertion, peripheral edema.  Respiratory: Negative for dyspnea at rest, dyspnea on exertion, cough, sputum, wheezing.  GI: See history of present illness. GU:  Negative for dysuria, hematuria, urinary incontinence, urinary frequency, nocturnal urination.  Endo: Negative for unusual weight change.    Physical Examination:   BP 112/65   Pulse 84   Temp 98.3 F (36.8 C) (Oral)   Ht 5\' 11"  (1.803 m)   Wt 283 lb (128.4 kg)   BMI 39.47 kg/m   General: Well-nourished, well-developed in no acute distress.  Eyes: No icterus. Mouth: Oropharyngeal mucosa moist and pink , no lesions erythema or exudate. Lungs: Clear to auscultation bilaterally.  Heart: Regular rate and rhythm, no murmurs rubs or gallops.  Abdomen: Bowel sounds are normal, nontender, nondistended, no hepatosplenomegaly or masses, no abdominal bruits or hernia , no rebound or guarding.   Extremities: No lower extremity edema. No clubbing or deformities. Neuro: Alert and oriented x 4   Skin: Warm and dry, no jaundice.   Psych: Alert and cooperative, normal mood and affect.  Labs:  Lab Results  Component Value Date   CREATININE 1.32 (H) 10/23/2018   BUN 30  (H) 10/23/2018   NA 140 10/23/2018   K 3.6 10/23/2018   CL 92 (L) 10/23/2018   CO2 30 (H) 10/23/2018   Lab Results  Component Value Date   ALT 11 10/01/2018   AST 19 10/01/2018   ALKPHOS 52 10/01/2018   BILITOT 0.9 10/01/2018  Lab Results  Component Value Date   WBC 7.9 10/09/2018   HGB 10.1 (L) 10/09/2018   HCT 30.1 (L) 10/09/2018   MCV 90 10/09/2018   PLT 343 10/09/2018   Lab Results  Component Value Date   IRON 31 (L) 10/23/2018   TIBC 446 10/23/2018   FERRITIN 36 10/23/2018    Imaging Studies: No results found.

## 2018-11-13 NOTE — Telephone Encounter (Signed)
It would be fine to take the doxycycline to see if it helps but I would also recommend that the patient go ahead and be seen by ENT help set up an appointment with Dr.Teoh

## 2018-11-13 NOTE — Telephone Encounter (Signed)
Right ear is wet every morning. Started 2 weeks ago and left ear is sometimes stopped up. Stops up 2 -3 times a day. Finished doxy. Headaches have stopped. Using mucinex, claritin d, sinus pressure and pain med, severe congestion and pain. Pt states he didn't know he had a prescription of doxy at the pharm and wife picked it up today. Wants to know if this is suppose to be for his ear and he wants to know what is causing this

## 2018-11-13 NOTE — Patient Instructions (Signed)
Colonoscopy and upper endoscopy as scheduled. See separate instructions.   If you see black stools, blood in stools, have increasing fatigue, shortness of breath, please go to the emergency room.

## 2018-11-14 ENCOUNTER — Telehealth: Payer: Self-pay | Admitting: Gastroenterology

## 2018-11-14 NOTE — Assessment & Plan Note (Addendum)
Very pleasant 68 year old gentleman with multiple comorbidities as outlined above presenting for hospital follow-up.  He was admitted last month when he presented with abdominal pain, melena, coffee-ground emesis.  Chronically on Plavix.  He had been on Eliquis but this was being switched over to Xarelto because of intolerance.  EGD showed mild Schatzki ring at the GE junction, nonbleeding cratered gastric ulcer with measuring 12 mm.  Surrounding mucosal deformity with numerous erosions.  Suspected NSAID related.  There was no H. pylori on biopsy.  Advised to have a 22-month follow-up EGD.  In the interim he has been doing better.  His hemoglobin is up although his labs are consistent with iron deficiency.  Recently heme positive.  No prior colonoscopy.  Currently he is on iron which was recently started, and he is on Xarelto.  Patient needs an upper endoscopy for ulcer surveillance.  Given iron deficiency and heme positive stool, would offer colonoscopy at the same time. Plan for deep sedation given polypharmacy.  I have discussed the risks, alternatives, benefits with regards to but not limited to the risk of reaction to medication, bleeding, infection, perforation and the patient is agreeable to proceed. Written consent to be obtained.  He will hold Xarelto for 24 to 36hours prior to procedure.

## 2018-11-14 NOTE — Telephone Encounter (Signed)
Patient belongs to Dr. Oneida Alar.  Initially assigned to her during hospital consultation.  Dr. Gala Romney did previous EGD while inpatient.  If patient willing, would switch him over to Dr. Oneida Alar his primary gastroenterologist.  Colonoscopy/EGD with propofol per original orders.   Please also make sure patient is aware that his last dose of Xarelto would be 2 days prior to his procedure (he would not take a dose a day before his procedure or the morning of his procedure).

## 2018-11-15 ENCOUNTER — Other Ambulatory Visit: Payer: Self-pay | Admitting: *Deleted

## 2018-11-15 DIAGNOSIS — R195 Other fecal abnormalities: Secondary | ICD-10-CM

## 2018-11-15 DIAGNOSIS — D500 Iron deficiency anemia secondary to blood loss (chronic): Secondary | ICD-10-CM

## 2018-11-15 DIAGNOSIS — K250 Acute gastric ulcer with hemorrhage: Secondary | ICD-10-CM

## 2018-11-15 NOTE — Telephone Encounter (Signed)
Pre-op scheduled for 6/17 at 9am. Letter mailed with instructions

## 2018-11-15 NOTE — Telephone Encounter (Signed)
Noted changes.   Please have him get CBC in 6 weeks to make sure stable. He can have done at [facility].    I also confirmed with cardiology that it is ok to hold Xarelto as planned for upcoming procedures.

## 2018-11-15 NOTE — Telephone Encounter (Signed)
Patient's wife Santiago Glad (on dpr) called back. Was agreeable for patient to switch to [facility] for procedure. He is now scheduled for 6/23 at 12:00pm with [facility] (1st available). New instructions mailed. Called endo and made aware to cancel orders.

## 2018-11-15 NOTE — Telephone Encounter (Signed)
Called pt and he states he will call us back. He is dealing with something right now and did not have time to talk

## 2018-11-18 ENCOUNTER — Other Ambulatory Visit: Payer: Self-pay

## 2018-11-18 DIAGNOSIS — D509 Iron deficiency anemia, unspecified: Secondary | ICD-10-CM

## 2018-11-18 NOTE — Progress Notes (Signed)
CC'D TO PCP °

## 2018-11-18 NOTE — Telephone Encounter (Signed)
Pt already aware.

## 2018-11-18 NOTE — Telephone Encounter (Addendum)
Pt and his wife are aware. Lab order on file for 12/30/2018.  Forwarding FYI to [facility].

## 2018-11-21 ENCOUNTER — Other Ambulatory Visit: Payer: Self-pay | Admitting: Family Medicine

## 2018-11-21 ENCOUNTER — Ambulatory Visit: Payer: Medicare Other | Admitting: Physician Assistant

## 2018-11-21 ENCOUNTER — Ambulatory Visit (HOSPITAL_COMMUNITY): Payer: Medicare Other

## 2018-11-25 ENCOUNTER — Other Ambulatory Visit: Payer: Self-pay

## 2018-11-25 ENCOUNTER — Ambulatory Visit (INDEPENDENT_AMBULATORY_CARE_PROVIDER_SITE_OTHER): Payer: Medicare Other | Admitting: *Deleted

## 2018-11-25 DIAGNOSIS — I255 Ischemic cardiomyopathy: Secondary | ICD-10-CM

## 2018-11-25 DIAGNOSIS — I5042 Chronic combined systolic (congestive) and diastolic (congestive) heart failure: Secondary | ICD-10-CM

## 2018-11-25 LAB — CUP PACEART REMOTE DEVICE CHECK
Battery Remaining Longevity: 122 mo
Battery Voltage: 3.01 V
Brady Statistic RV Percent Paced: 0.01 %
Date Time Interrogation Session: 20200330073523
HighPow Impedance: 103 Ohm
Implantable Lead Implant Date: 20170921
Implantable Lead Location: 753860
Implantable Pulse Generator Implant Date: 20170921
Lead Channel Impedance Value: 399 Ohm
Lead Channel Pacing Threshold Amplitude: 0.875 V
Lead Channel Pacing Threshold Pulse Width: 0.4 ms
Lead Channel Sensing Intrinsic Amplitude: 4.5 mV
Lead Channel Sensing Intrinsic Amplitude: 4.5 mV
Lead Channel Setting Pacing Amplitude: 2.5 V
Lead Channel Setting Pacing Pulse Width: 0.4 ms
Lead Channel Setting Sensing Sensitivity: 0.3 mV
MDC IDC MSMT LEADCHNL RV IMPEDANCE VALUE: 456 Ohm

## 2018-11-26 ENCOUNTER — Other Ambulatory Visit: Payer: Self-pay | Admitting: Family Medicine

## 2018-11-28 ENCOUNTER — Other Ambulatory Visit: Payer: Self-pay

## 2018-11-28 DIAGNOSIS — D509 Iron deficiency anemia, unspecified: Secondary | ICD-10-CM

## 2018-12-02 ENCOUNTER — Other Ambulatory Visit: Payer: Self-pay | Admitting: Family Medicine

## 2018-12-03 NOTE — Progress Notes (Signed)
Remote ICD transmission.   

## 2018-12-04 ENCOUNTER — Other Ambulatory Visit: Payer: Self-pay | Admitting: Family Medicine

## 2018-12-04 MED ORDER — HYDROCODONE-ACETAMINOPHEN 10-325 MG PO TABS: ORAL_TABLET | ORAL | 0 refills | Status: DC

## 2018-12-04 NOTE — Telephone Encounter (Signed)
Pain prescription was sent in

## 2018-12-07 ENCOUNTER — Other Ambulatory Visit: Payer: Self-pay | Admitting: Family Medicine

## 2018-12-20 ENCOUNTER — Other Ambulatory Visit: Payer: Self-pay | Admitting: Gastroenterology

## 2018-12-20 DIAGNOSIS — D509 Iron deficiency anemia, unspecified: Secondary | ICD-10-CM | POA: Diagnosis not present

## 2018-12-20 LAB — CBC WITH DIFFERENTIAL/PLATELET
Absolute Monocytes: 941 cells/uL (ref 200–950)
Basophils Absolute: 49 cells/uL (ref 0–200)
Basophils Relative: 0.5 %
Eosinophils Absolute: 235 cells/uL (ref 15–500)
Eosinophils Relative: 2.4 %
HCT: 39.7 % (ref 38.5–50.0)
Hemoglobin: 13 g/dL — ABNORMAL LOW (ref 13.2–17.1)
Lymphs Abs: 1019 cells/uL (ref 850–3900)
MCH: 30 pg (ref 27.0–33.0)
MCHC: 32.7 g/dL (ref 32.0–36.0)
MCV: 91.7 fL (ref 80.0–100.0)
MPV: 10.5 fL (ref 7.5–12.5)
Monocytes Relative: 9.6 %
Neutro Abs: 7556 cells/uL (ref 1500–7800)
Neutrophils Relative %: 77.1 %
Platelets: 281 10*3/uL (ref 140–400)
RBC: 4.33 10*6/uL (ref 4.20–5.80)
RDW: 19.2 % — ABNORMAL HIGH (ref 11.0–15.0)
Total Lymphocyte: 10.4 %
WBC: 9.8 10*3/uL (ref 3.8–10.8)

## 2018-12-21 ENCOUNTER — Other Ambulatory Visit: Payer: Self-pay | Admitting: Family Medicine

## 2018-12-23 NOTE — Telephone Encounter (Signed)
Pt contacted and transferred up front to set up appt for Tuesday 12/24/2018

## 2018-12-23 NOTE — Telephone Encounter (Signed)
We can do a virtual visit tomorrow on Tuesday

## 2018-12-24 ENCOUNTER — Other Ambulatory Visit: Payer: Self-pay

## 2018-12-24 ENCOUNTER — Telehealth: Payer: Self-pay

## 2018-12-24 ENCOUNTER — Other Ambulatory Visit: Payer: Self-pay | Admitting: Family Medicine

## 2018-12-24 ENCOUNTER — Ambulatory Visit (INDEPENDENT_AMBULATORY_CARE_PROVIDER_SITE_OTHER): Payer: Medicare Other | Admitting: Family Medicine

## 2018-12-24 DIAGNOSIS — E1121 Type 2 diabetes mellitus with diabetic nephropathy: Secondary | ICD-10-CM | POA: Diagnosis not present

## 2018-12-24 DIAGNOSIS — I255 Ischemic cardiomyopathy: Secondary | ICD-10-CM

## 2018-12-24 DIAGNOSIS — E1122 Type 2 diabetes mellitus with diabetic chronic kidney disease: Secondary | ICD-10-CM | POA: Diagnosis not present

## 2018-12-24 DIAGNOSIS — I10 Essential (primary) hypertension: Secondary | ICD-10-CM

## 2018-12-24 DIAGNOSIS — N182 Chronic kidney disease, stage 2 (mild): Secondary | ICD-10-CM | POA: Diagnosis not present

## 2018-12-24 DIAGNOSIS — J3489 Other specified disorders of nose and nasal sinuses: Secondary | ICD-10-CM

## 2018-12-24 DIAGNOSIS — Z79899 Other long term (current) drug therapy: Secondary | ICD-10-CM

## 2018-12-24 MED ORDER — HYDROCODONE-ACETAMINOPHEN 10-325 MG PO TABS: ORAL_TABLET | ORAL | 0 refills | Status: DC

## 2018-12-24 MED ORDER — LINACLOTIDE 290 MCG PO CAPS: 290.0000 ug | ORAL_CAPSULE | Freq: Every day | ORAL | 5 refills | Status: DC

## 2018-12-24 NOTE — Progress Notes (Signed)
Labs orders placed and mailed to patient with directions to have labs done in July with office visit in Late July

## 2018-12-24 NOTE — Telephone Encounter (Signed)
Pt will use his Wifes phone for his Virtual appt. He does not have a BP machine at home but he will check his weight. Pt was advised that his Echo appt would be moved out to August.   [place] AN E-VISIT FOR YOUR APPOINTMENT - PLEASE REVIEW IMPORTANT INFORMATION BELOW SEVERAL DAYS PRIOR TO YOUR APPOINTMENT  Due to the recent COVID-19 pandemic, we are transitioning in-person office visits to tele-medicine visits in an effort to decrease unnecessary exposure to our patients, their families, and staff. These visits are billed to your insurance just like a normal visit is. We also encourage you to sign up for MyChart if you have not already done so. You will need a smartphone if possible. For patients that do not have this, we can still complete the visit using a regular telephone but do prefer a smartphone to enable video when possible. You may have a family member that lives with you that can help. If possible, we also ask that you have a blood pressure cuff and scale at home to measure your blood pressure, heart rate and weight prior to your scheduled appointment. Patients with clinical needs that need an in-person evaluation and testing will still be able to come to the office if absolutely necessary. If you have any questions, feel free to call our office.     YOUR PROVIDER WILL BE USING THE FOLLOWING PLATFORM TO COMPLETE YOUR VISIT: [email or web address]  . IF USING MYCHART - How to Download the MyChart App to Your SmartPhone   - If Apple, go to [employer] and type in MyChart in the search bar and download the app. If Android, ask patient to go to [employer] and type in [place] in the search bar and download the app. The app is free but as with any other app downloads, your phone may require you to verify saved payment information or Apple/Android password.  - You will need to then log into the app with your MyChart username and password, and select [facility] as your healthcare  provider to link the account.  - When it is time for your visit, go to the MyChart app, find appointments, and click Begin Video Visit. Be sure to Select Allow for your device to access the Microphone and Camera for your visit. You will then be connected, and your provider will be with you shortly.  **If you have any issues connecting or need assistance, please contact MyChart service desk [phone])**  **If using a computer, in order to ensure the best quality for your visit, you will need to use either of the following Internet Browsers: Insurance underwriter or [employer]**  . IF USING DOXIMITY or [email or web address] - The staff will give you instructions on receiving your link to join the meeting the day of your visit.      2-3 DAYS BEFORE YOUR APPOINTMENT  You will receive a telephone call from one of our [place] team members - your caller ID may say "Unknown caller." If this is a video visit, we will walk you through how to get the video launched on your phone. We will remind you check your blood pressure, heart rate and weight prior to your scheduled appointment. If you have an Apple Watch or Kardia, please upload any pertinent ECG strips the day before or morning of your appointment to [facility]. Our staff will also make sure you have reviewed the consent and agree to move forward with your scheduled  tele-health visit.     THE DAY OF YOUR APPOINTMENT  Approximately 15 minutes prior to your scheduled appointment, you will receive a telephone call from one of [place] team - your caller ID may say "Unknown caller."  Our staff will confirm medications, vital signs for the day and any symptoms you may be experiencing. Please have this information available prior to the time of visit start. It may also be helpful for you to have a pad of paper and pen handy for any instructions given during your visit. They will also walk you through joining the smartphone meeting if this is a video  visit.    CONSENT FOR TELE-HEALTH VISIT - PLEASE REVIEW  I hereby voluntarily request, consent and authorize [facility] and its employed or contracted physicians, physician assistants, nurse practitioners or other licensed health care professionals (the Practitioner), to provide me with telemedicine health care services (the "Services") as deemed necessary by the treating Practitioner. I acknowledge and consent to receive the Services by the Practitioner via telemedicine. I understand that the telemedicine visit will involve communicating with the Practitioner through live audiovisual communication technology and the disclosure of certain medical information by electronic transmission. I acknowledge that I have been given the opportunity to request an in-person assessment or other available alternative prior to the telemedicine visit and am voluntarily participating in the telemedicine visit.  I understand that I have the right to withhold or withdraw my consent to the use of telemedicine in the course of my care at any time, without affecting my right to future care or treatment, and that the Practitioner or I may terminate the telemedicine visit at any time. I understand that I have the right to inspect all information obtained and/or recorded in the course of the telemedicine visit and may receive copies of available information for a reasonable fee.  I understand that some of the potential risks of receiving the Services via telemedicine include:  Marland Kitchen Delay or interruption in medical evaluation due to technological equipment failure or disruption; . Information transmitted may not be sufficient (e.g. poor resolution of images) to allow for appropriate medical decision making by the Practitioner; and/or  . In rare instances, security protocols could fail, causing a breach of personal health information.  Furthermore, I acknowledge that it is my responsibility to provide information about my medical  history, conditions and care that is complete and accurate to the best of my ability. I acknowledge that Practitioner's advice, recommendations, and/or decision may be based on factors not within their control, such as incomplete or inaccurate data provided by me or distortions of diagnostic images or specimens that may result from electronic transmissions. I understand that the practice of medicine is not an exact science and that Practitioner makes no warranties or guarantees regarding treatment outcomes. I acknowledge that I will receive a copy of this consent concurrently upon execution via email to the email address I last provided but may also request a printed copy by calling the office of [facility].    I understand that my insurance will be billed for this visit.   I have read or had this consent read to me. . I understand the contents of this consent, which adequately explains the benefits and risks of the Services being provided via telemedicine.  . I have been provided ample opportunity to ask questions regarding this consent and the Services and have had my questions answered to my satisfaction. . I give my informed consent for the  services to be provided through the use of telemedicine in my medical care  By participating in this telemedicine visit I agree to the above.

## 2018-12-24 NOTE — Progress Notes (Signed)
   Subjective:    Patient ID: [name] [name], male    DOB: 1951-07-17, 68 y.o.   MRN: [id number]   Format- Video  Patient present at home Provider present at office Consent for interaction obtained Coronavirus outbreak made virtual visit necessary   Sinus Problem  This is a new problem. The current episode started 1 to 4 weeks ago. Associated symptoms include sinus pressure. Pertinent negatives include no headaches.   Doxycycline makes it feel better but it comes back when he stops it He relates a lot of sinus pressure discomfort radiates into the left ear sometimes into the right ear denies any major issues otherwise.  No sweats chills fever.  No wheezing difficulty breathing.  He does have chronic pain for which he takes hydrocodone intermittently needs a refill sent in drug registry was checked  He also uses Excedrin Migraine for his headaches I encouraged him because of him being on a blood thinner to avoid that We will send him a letter to go through the points of discussion Review of Systems  HENT: Positive for sinus pressure.   Neurological: Negative for headaches.   Virtual Visit via Video Note  I connected with [name] [name] on 12/24/18 at 11:00 AM EDT by a video enabled telemedicine application and verified that I am speaking with the correct person using two identifiers.   I discussed the limitations of evaluation and management by telemedicine and the availability of in person appointments. The patient expressed understanding and agreed to proceed.  History of Present Illness:    Observations/Objective:   Assessment and Plan:   Follow Up Instructions:    I discussed the assessment and treatment plan with the patient. The patient was provided an opportunity to ask questions and all were answered. The patient agreed with the plan and demonstrated an understanding of the instructions.   The patient was advised to call back or seek an in-person evaluation if  the symptoms worsen or if the condition fails to improve as anticipated.  I provided 15 minutes of non-face-to-face time during this encounter.        Objective:   Physical Exam        Assessment & Plan:  Patient will be due for lab work for his diabetes hyperlipidemia etc. to be done in the summertime with follow-up office visit in approximately 3 months  Sinus pressure and discomfort I would recommend using Flonase along with saline nasal spray and Mucinex if he has ongoing troubles he needs to let us know  It would be best for him to stay away from Excedrin tablets and use Tylenol or his hydrocodone  If he has ongoing trouble we can refer him to ENT  I told the patient it would not be safe for him to take doxycycline on a ongoing basis daily and day in and day  out

## 2018-12-27 ENCOUNTER — Encounter: Payer: Self-pay | Admitting: Family Medicine

## 2018-12-31 NOTE — Progress Notes (Signed)
[facility]   MULTIDISCIPLINARY HEART VALVE TEAM     Virtual Visit via Video Note   This visit type was conducted due to national recommendations for restrictions regarding the COVID-19 Pandemic (e.g. social distancing) in an effort to limit this patient's exposure and mitigate transmission in our community.  Due to his co-morbid illnesses, this patient is at least at moderate risk for complications without adequate follow up.  This format is felt to be most appropriate for this patient at this time.  All issues noted in this document were discussed and addressed.  A limited physical exam was performed with this format.  Please refer to the patient's chart for his consent to telehealth for [facility].   Evaluation Performed:  Follow-up visit  Date:  01/01/2019   ID:  [name] [name], DOB [birth year], MRN [id number]  Patient Location: Home Provider Location: Office  PCP:  Kathyrn Drown, MD  Cardiologist:  Carlyle Dolly, MD / Dr. Burt Knack & Dr. Roxy Manns (TAVR) Electrophysiologist:  Will Meredith Leeds, MD   Chief Complaint:  1 year s/p TAVR  History of Present Illness:    [name] [name] is a 68 y.o. male with a history of CAD (s/p CABG in 2001 with LIMA-LAD, SVG-RI, seq SVG-OM2-RPL, and seq SVG-Mrg-PDA, s/p stenting of SVG-RI, patent grafts by cath in 09/2017), severe AS (s/p TAVR in 10/2017), chronic combined systolic and diastolic CHF (EF 70-48% by echo in 12/2017 and s/p ICD placement in 2017), PAF on Xarelto, HTN, HLD, and Stage 3 CKD who presents for follow up.   The patient does not have symptoms concerning for COVID-19 infection (fever, chills, cough, or new shortness of breath).   Last year, the patient was noted to have symptomatic severe aortic stenosis with mean gradient 34 mmHg (low-flow, low gradient).  He was evaluated by the structural heart clinic and cardiothoracic surgery and it was recommended that he undergo aortic valve replacement.  Pre TAVR cath  showed 4/4 patent bypass grafts.   The patient was treated with a 29 mm Sapien 3 valve via a percutaneous transfemoral approach on 11/20/2017. Postoperative day #1 echo study demonstrated severely reduced LV systolic function with LVEF 20 to 25% with diffuse hypokinesis and normal function of the bioprosthetic aortic valve with a mean gradient of 9 mmHg and no significant paravalvular regurgitation. 1 month echo showed LV systolic dysfunction (unchanged), normal function of the aortic valve prosthesis with a mean gradient of 4 mmHg, respectively with no PVL.   He was most admitted to [facility] from 10/01/2018 to 10/04/2018 for evaluation of abdominal pain and black tarry stools.  He underwent an EGD which showed a mild Schatzki ring, small hiatal hernia, and a nonbleeding gastric ulcer with no stigmata of bleeding. His findings were thought to be secondary to NSAID use. Plavix was discontinued. He was continue on Eliquis for afib. Later this was switched to Xarelto 15 mg daily by his PCP.  Today he presents for a telehealth visit follow up. He is doing well. He has chronic stable angina with heavy exertion. He has some shortness of breath, mostly when he is "overdoing it." He stays busy doing chores and yard work with no significant limitations. No LE edema, orthopnea or PND. No dizziness or syncope. No blood in his stool or urine. He gets a lot of sinus infections which improved on doxycycline, but PCP will no longer Rx this.     Past Medical History:  Diagnosis Date  . AICD (  automatic cardioverter/defibrillator) present 05/18/2016  . Anginal pain ([place])    occ none recent  . Aortic stenosis   . Arthritis   . CAD (coronary artery disease)   . Cardiomyopathy, ischemic   . CHF (congestive heart failure) ([place])   . Chronic back pain   . Diabetes mellitus without complication ([place])   . Elevated PSA   . History of kidney stones   . Hyperlipidemia   . Morbid obesity ([place])   . Myocardial infarction  ([place])   . OSA (obstructive sleep apnea)    does not wear CPAP, -order run out told need to redo  test  . Paroxysmal atrial fibrillation ([place]) 09/29/2018   This was found on event monitor October 2019  . 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 APPENDECTOMY    . BIOPSY  10/02/2018   Procedure: BIOPSY;  Surgeon: Daneil Dolin, MD;  [facility];  Service: Endoscopy;;  gastric  . CARDIAC CATHETERIZATION     stents  . CAROTID STENT     denies carotid stent  . CATARACT EXTRACTION, BILATERAL    . CORONARY ARTERY BYPASS GRAFT  12/23/1999   LIMA to LAD, SVG to ramus, sequential SVG to OM2-RPL, sequential SVG to AM-PDA  . EP IMPLANTABLE DEVICE N/A 05/18/2016   Procedure: ICD Implant;  Surgeon: Will Meredith Leeds, MD;  Location: [place] CV LAB;  Service: Cardiovascular;  Laterality: N/A;  . ESOPHAGOGASTRODUODENOSCOPY (EGD) WITH PROPOFOL N/A 10/02/2018   Procedure: ESOPHAGOGASTRODUODENOSCOPY (EGD) WITH PROPOFOL;  Surgeon: Daneil Dolin, MD;  [facility];  Service: Endoscopy;  Laterality: N/A;  . EYE SURGERY    . ingrown toenail N/A   . LEFT HEART CATHETERIZATION WITH CORONARY/GRAFT ANGIOGRAM N/A 12/21/2014   Procedure: LEFT HEART CATHETERIZATION WITH Beatrix Fetters;  Surgeon: Belva Crome, MD;  Location: [facility] CATH LAB;  Service: Cardiovascular;  Laterality: N/A;  . NASAL SEPTOPLASTY W/ TURBINOPLASTY Bilateral 04/25/2017   NASAL SEPTOPLASTY WITH TURBINATE REDUCTION/notes 04/25/2017  . NASAL SEPTOPLASTY W/ TURBINOPLASTY Bilateral 04/25/2017   Procedure: NASAL SEPTOPLASTY WITH TURBINATE REDUCTION;  Surgeon: Leta Baptist, MD;  Location: [place];  Service: ENT;  Laterality: Bilateral;  .  RIGHT/LEFT HEART CATH AND CORONARY/GRAFT ANGIOGRAPHY N/A 10/05/2017   Procedure: RIGHT/LEFT HEART CATH AND CORONARY/GRAFT ANGIOGRAPHY;  Surgeon: Sherren Mocha, MD;  Location: [place] CV LAB;  Service: Cardiovascular;  Laterality: N/A;  . TEE WITHOUT CARDIOVERSION N/A 11/20/2017   Procedure: TRANSESOPHAGEAL ECHOCARDIOGRAM (TEE);  Surgeon: Sherren Mocha, MD;  Location: [place];  Service: Open Heart Surgery;  Laterality: N/A;  . TONSILLECTOMY    . TRANSCATHETER AORTIC VALVE REPLACEMENT, TRANSFEMORAL N/A 11/20/2017   Procedure: TRANSCATHETER AORTIC VALVE REPLACEMENT, TRANSFEMORAL;  Surgeon: Sherren Mocha, MD;  Location: [place];  Service: Open Heart Surgery;  Laterality: N/A;     Current Meds  Medication Sig  . acetaminophen (TYLENOL) 500 MG tablet Take 1 tablet (500 mg total) by mouth 2 (two) times daily. (Patient taking differently: Take 500 mg by mouth as needed. )  . carvedilol (COREG) 3.125 MG tablet Take 1 tablet (3.125 mg total) by mouth 2 (two) times daily with a meal.  . Ferrous Gluconate (IRON 27 PO) Take by mouth daily.  Marland Kitchen  HYDROcodone-acetaminophen (NORCO) 10-325 MG tablet One bid prn pain  . LEVEMIR FLEXTOUCH 100 UNIT/ML Pen INJECT 60 UNITS INTO THE SKIN EACH EVENING. MAY TITRATE UP TO 80 UNITS.  Marland Kitchen linaclotide (LINZESS) 290 MCG CAPS capsule Take 1 capsule (290 mcg total) by mouth daily before breakfast.  . liver oil-zinc oxide (DESITIN) 40 % ointment Apply 1 application topically as needed for irritation.  . metolazone (ZAROXOLYN) 2.5 MG tablet TAKE ONE TABLET BY MOUTH DAILY. (Patient taking differently: every other day. )  . Multiple Vitamins-Minerals (CENTRUM SILVER PO) Take 1 tablet by mouth daily.   . nitroGLYCERIN (NITROSTAT) 0.4 MG SL tablet PLACE 1 TABLET UNDER TONGUE FOR CHEST PAIN. MAY REPEAT EVERY 5 MIN UPTO 3 DOSES-NO RELIEF,CALL 911.  Marland Kitchen NOVOLOG FLEXPEN 100 UNIT/ML FlexPen INJECT 20 UNITS INTO THE SKIN THREE TIMES DAILY WITH MEALS. (Patient taking differently: 60 Units 2  (two) times daily. )  . pantoprazole (PROTONIX) 40 MG tablet Take 1 tablet (40 mg total) by mouth 2 (two) times daily.  . polycarbophil (FIBERCON) 625 MG tablet Take 2 tablets (1,250 mg total) by mouth daily with lunch. Hold if you have diarrhea (Patient taking differently: 3 tabs in the morning, 3 at noon and 2 in evening)  . Polyethyl Glycol-Propyl Glycol (SYSTANE) 0.4-0.3 % GEL ophthalmic gel Place 1 application into both eyes.  . potassium chloride SA (KLOR-CON M20) 20 MEQ tablet Take 3 qam, 3 at lunch, and 2 at supper  . Rivaroxaban (XARELTO) 15 MG TABS tablet Take 1 tablet (15 mg total) by mouth daily with supper.  . rosuvastatin (CRESTOR) 40 MG tablet Take 1 tablet (40 mg total) by mouth daily.  . Simethicone (GAS-X PO) Take by mouth as needed.  Marland Kitchen spironolactone (ALDACTONE) 25 MG tablet Take 1 tablet (25 mg total) by mouth daily. (Patient taking differently: Take 25 mg by mouth every other day. )  . SURE COMFORT PEN NEEDLES 31G X 8 MM MISC USING 4 TIMES DAILY.  Marland Kitchen torsemide (DEMADEX) 20 MG tablet 2 qam and 2 q 3pm (Patient taking differently: 3 qam and 3 q 3pm)  . traMADol (ULTRAM) 50 MG tablet Take one po Q 6-8 prn headache.  . trolamine salicylate (ASPERCREME) 10 % cream Apply 1 application topically 2 (two) times daily as needed for muscle pain.      Allergies:   Dyflex-g [dyphylline-guaifenesin] and Eliquis [apixaban]   Social History   Tobacco Use  . Smoking status: Former Smoker    Last attempt to quit: 08/29/1999    Years since quitting: 19.3  . Smokeless tobacco: Never Used  . Tobacco comment: couldn't remember date  Substance Use Topics  . Alcohol use: No    Alcohol/week: 0.0 standard drinks  . Drug use: No     Family Hx: The patient's family history includes CAD in his mother; Diabetes in his mother. There is no history of Colon cancer or Colon polyps. He was adopted.  ROS:   Please see the history of present illness.    All other systems reviewed and are negative.    Prior CV studies:   The following studies were reviewed today: TAVR OPERATIVE NOTE   Date of Procedure:                11/20/2017  Preoperative Diagnosis:      Severe Aortic Stenosis   Postoperative Diagnosis:    Same   Procedure:        Transcatheter Aortic Valve Replacement - Percutaneous Right Transfemoral Approach  Edwards Sapien 3 THV (size 29 mm, model # 9600TFX, serial # [id number])              Co-Surgeons:                        Valentina Gu. Roxy Manns, MD and Sherren Mocha, MD  Anesthesiologist:                  Arabella Merles, MD  Echocardiographer:              Ena Dawley, MD  Pre-operative Echo Findings: ? Sievers type I bicuspid aortic valve ? Severe aortic stenosis ? Severe left ventricular systolic dysfunction ? Mild-moderate mitral regurgitation  Post-operative Echo Findings: ? Trivial paravalvular leak ? Unchanged left ventricular systolic function  _____________   Echo 01/04/18 Study Conclusions - Left ventricle: The cavity size was mildly dilated. Wall   thickness was increased in a pattern of mild LVH. LV EF 15-20%.   Diffuse hypokinesis. Features are consistent with a pseudonormal   left ventricular filling pattern, with concomitant abnormal   relaxation and increased filling pressure (grade 2 diastolic   dysfunction). - Aortic valve: Bioprosthetic aortic valve s/p TAVR. There was no   regurgitation. No significant bioprosthetic valve stenosis. Mean   gradient (S): 4 mm Hg. - Mitral valve: Mildly calcified annulus. There was trivial   regurgitation. - Left atrium: The atrium was severely dilated. - Right ventricle: The cavity size was mildly dilated. Pacer wire   or catheter noted in right ventricle. Systolic function was   moderately reduced. - Pulmonary arteries: No complete TR doppler jet so unable to   estimate PA systolic pressure. - Systemic veins: IVC measured 3 cm with < 50% respirophasic   variation,  suggesting RA pressure 15 mmHg. - Pericardium, extracardiac: A trivial pericardial effusion was   identified. Impressions: - Technically difficult study with poor acoustic windows. Mildly   dilated LV with mild LV hypertrophy. EF 15-20%. Moderate   diastolic dysfunction. Mildly dilated RV with moderately   decreased systolic function. Bioprosthetic aortic valve s/p TAVR,   the valve appears to function normally.  Labs/Other Tests and Data Reviewed:    EKG:  No ECG reviewed.  Recent Labs: 09/12/2018: BNP 477.4 10/01/2018: ALT 11 10/23/2018: BUN 30; Creatinine, Ser 1.32; Magnesium 2.1; Potassium 3.6; Sodium 140 12/20/2018: Hemoglobin 13.0; Platelets 281   Recent Lipid Panel Lab Results  Component Value Date/Time   CHOL 146 06/05/2018 03:47 PM   TRIG 312 (H) 06/05/2018 03:47 PM   HDL 31 (L) 06/05/2018 03:47 PM   CHOLHDL 4.7 06/05/2018 03:47 PM   CHOLHDL 3.9 01/02/2015 10:10 AM   LDLCALC 53 06/05/2018 03:47 PM    Wt Readings from Last 3 Encounters:  01/01/19 274 lb (124.3 kg)  11/13/18 283 lb (128.4 kg)  11/06/18 286 lb 9.6 oz (130 kg)     Objective:    Vital Signs:  BP 106/78   Pulse 83   Wt 274 lb (124.3 kg)   BMI 38.22 kg/m    Well nourished, well developed male in no acute distress.    ASSESSMENT & PLAN:    Severe AS s/p TAVR: doing well with NYHA class II symptoms. He has amoxicillin for SBE prophylaxis. Echocardiogram has been pushed out till July given covid 19 pandemic.  HTN: Bp well controlled. Continue current regimen   Chronic diastolic CHF: weight has been very stable. No LE edema. Continue same dose of Torsemide and  Metolazone.   CAD s/p CABG: he chronic stable angina. Pre TAVR cath showed patent bypass grafts. No aspirin given Xarelto use. Continue statin and BB.  PAF: incidental finding on device interrogation. Continue Xarelto 15mg  daily.   COVID-19 Education: the signs and symptoms of COVID-19 were discussed with the patient and how to seek care for  testing (follow up with PCP or arrange E-visit).  The importance of social distancing was discussed today.  Time:   Today, I have spent 25 minutes with the patient with telehealth technology discussing the above problems.     Medication Adjustments/Labs and Tests Ordered: Current medicines are reviewed at length with the patient today.  Concerns regarding medicines are outlined above.   Tests Ordered: No orders of the defined types were placed in this encounter.   Medication Changes: No orders of the defined types were placed in this encounter.   Disposition:  Follow up in 2 month(s) for echo.   Signed, Angelena Form, PA-C  01/01/2019 3:01 PM    [facility]

## 2019-01-01 ENCOUNTER — Telehealth: Payer: Self-pay | Admitting: Cardiology

## 2019-01-01 ENCOUNTER — Other Ambulatory Visit: Payer: Self-pay

## 2019-01-01 ENCOUNTER — Other Ambulatory Visit (HOSPITAL_COMMUNITY): Payer: Medicare Other

## 2019-01-01 ENCOUNTER — Telehealth (INDEPENDENT_AMBULATORY_CARE_PROVIDER_SITE_OTHER): Payer: Medicare Other | Admitting: Physician Assistant

## 2019-01-01 VITALS — BP 106/78 | HR 83 | Wt 274.0 lb

## 2019-01-01 DIAGNOSIS — Z952 Presence of prosthetic heart valve: Secondary | ICD-10-CM | POA: Diagnosis not present

## 2019-01-01 DIAGNOSIS — Z7189 Other specified counseling: Secondary | ICD-10-CM

## 2019-01-01 DIAGNOSIS — I10 Essential (primary) hypertension: Secondary | ICD-10-CM

## 2019-01-01 DIAGNOSIS — I25118 Atherosclerotic heart disease of native coronary artery with other forms of angina pectoris: Secondary | ICD-10-CM

## 2019-01-01 DIAGNOSIS — I5022 Chronic systolic (congestive) heart failure: Secondary | ICD-10-CM

## 2019-01-01 NOTE — Patient Instructions (Signed)
Hello "[name] [name]" :) ,   It was so nice to talk to you on the audio/video chat today. I am so glad you are doing so well. I just wanted to send you a recap of our discussion.   Please continue taking antibiotics prior to any dental work including cleanings (amoxicillin as prescribed by your dentist/PCP). Please continue on all your same medications.    Your 1 year echo has been rescheduled to July. Please continue regular follow up with you primary cardiologist.   All appointment details are listed in upcoming appointments in MyChart or attached to this letter in your "after visit summary."  Please call us with any questions or concerns you may have and please stay safe during these uncertain times.  Nell Range

## 2019-01-01 NOTE — Telephone Encounter (Signed)
New message   Spoke w/ pt and scheduled for 05.07.20 with Dr. Curt Bears. Video visit through doxemity. Please send text to [place] phone, number is listed in appt notes.      Virtual Visit Pre-Appointment Phone Call  "(Name), I am calling you today to discuss your upcoming appointment. We are currently trying to limit exposure to the virus that causes COVID-19 by seeing patients at home rather than in the office."  1. "What is the BEST phone number to call the day of the visit?" - include this in appointment notes  2. Do you have or have access to (through a family member/friend) a smartphone with video capability that we can use for your visit?" a. If yes - list this number in appt notes as cell (if different from BEST phone #) and list the appointment type as a VIDEO visit in appointment notes b. If no - list the appointment type as a PHONE visit in appointment notes  3. Confirm consent - "In the setting of the current Covid19 crisis, you are scheduled for a (phone or video) visit with your provider on (date) at (time).  Just as we do with many in-office visits, in order for you to participate in this visit, we must obtain consent.  If you'd like, I can send this to your mychart (if signed up) or email for you to review.  Otherwise, I can obtain your verbal consent now.  All virtual visits are billed to your insurance company just like a normal visit would be.  By agreeing to a virtual visit, we'd like you to understand that the technology does not allow for your provider to perform an examination, and thus may limit your provider's ability to fully assess your condition. If your provider identifies any concerns that need to be evaluated in person, we will make arrangements to do so.  Finally, though the technology is pretty good, we cannot assure that it will always work on either your or our end, and in the setting of a video visit, we may have to convert it to a phone-only visit.  In either  situation, we cannot ensure that we have a secure connection.  Are you willing to proceed?" STAFF: Did the patient verbally acknowledge consent to telehealth visit? Document YES/NO here: YES  4. Advise patient to be prepared - "Two hours prior to your appointment, go ahead and check your blood pressure, pulse, oxygen saturation, and your weight (if you have the equipment to check those) and write them all down. When your visit starts, your provider will ask you for this information. If you have an Apple Watch or Kardia device, please plan to have heart rate information ready on the day of your appointment. Please have a pen and paper handy nearby the day of the visit as well."  5. Give patient instructions for MyChart download to smartphone OR Doximity/Doxy.me as below if video visit (depending on what platform provider is using)  6. Inform patient they will receive a phone call 15 minutes prior to their appointment time (may be from unknown caller ID) so they should be prepared to answer    TELEPHONE CALL NOTE  [name] [name] has been deemed a candidate for a follow-up tele-health visit to limit community exposure during the Covid-19 pandemic. I spoke with the patient via phone to ensure availability of phone/video source, confirm preferred email & phone number, and discuss instructions and expectations.  I reminded [name] [name] to be prepared with  any vital sign and/or heart rhythm information that could potentially be obtained via home monitoring, at the time of his visit. I reminded [name] [name] to expect a phone call prior to his visit.  [name] [name] 01/01/2019 1:52 PM   INSTRUCTIONS FOR DOWNLOADING THE MYCHART APP TO SMARTPHONE  - The patient must first make sure to have activated MyChart and know their login information - If Apple, go to [employer] and type in MyChart in the search bar and download the app. If Android, ask patient to go to [employer] and type in [facility]  in the search bar and download the app. The app is free but as with any other app downloads, their phone may require them to verify saved payment information or Apple/Android password.  - The patient will need to then log into the app with their MyChart username and password, and select [facility] as their healthcare provider to link the account. When it is time for your visit, go to the MyChart app, find appointments, and click Begin Video Visit. Be sure to Select Allow for your device to access the Microphone and Camera for your visit. You will then be connected, and your provider will be with you shortly.  **If they have any issues connecting, or need assistance please contact MyChart service desk [phone])**  **If using a computer, in order to ensure the best quality for their visit they will need to use either of the following Internet Browsers: [employer], or Google Chrome**  IF USING DOXIMITY or DOXY.ME - The patient will receive a link just prior to their visit by text.     FULL LENGTH CONSENT FOR TELE-HEALTH VISIT   I hereby voluntarily request, consent and authorize [facility] and its employed or contracted physicians, physician assistants, nurse practitioners or other licensed health care professionals (the Practitioner), to provide me with telemedicine health care services (the Services") as deemed necessary by the treating Practitioner. I acknowledge and consent to receive the Services by the Practitioner via telemedicine. I understand that the telemedicine visit will involve communicating with the Practitioner through live audiovisual communication technology and the disclosure of certain medical information by electronic transmission. I acknowledge that I have been given the opportunity to request an in-person assessment or other available alternative prior to the telemedicine visit and am voluntarily participating in the telemedicine visit.  I understand  that I have the right to withhold or withdraw my consent to the use of telemedicine in the course of my care at any time, without affecting my right to future care or treatment, and that the Practitioner or I may terminate the telemedicine visit at any time. I understand that I have the right to inspect all information obtained and/or recorded in the course of the telemedicine visit and may receive copies of available information for a reasonable fee.  I understand that some of the potential risks of receiving the Services via telemedicine include:   Delay or interruption in medical evaluation due to technological equipment failure or disruption;  Information transmitted may not be sufficient (e.g. poor resolution of images) to allow for appropriate medical decision making by the Practitioner; and/or   In rare instances, security protocols could fail, causing a breach of personal health information.  Furthermore, I acknowledge that it is my responsibility to provide information about my medical history, conditions and care that is complete and accurate to the best of my ability. I acknowledge that Practitioner's advice, recommendations, and/or  decision may be based on factors not within their control, such as incomplete or inaccurate data provided by me or distortions of diagnostic images or specimens that may result from electronic transmissions. I understand that the practice of medicine is not an exact science and that Practitioner makes no warranties or guarantees regarding treatment outcomes. I acknowledge that I will receive a copy of this consent concurrently upon execution via email to the email address I last provided but may also request a printed copy by calling the office of [place].    I understand that my insurance will be billed for this visit.   I have read or had this consent read to me.  I understand the contents of this consent, which adequately explains the benefits and risks  of the Services being provided via telemedicine.   I have been provided ample opportunity to ask questions regarding this consent and the Services and have had my questions answered to my satisfaction.  I give my informed consent for the services to be provided through the use of telemedicine in my medical care  By participating in this telemedicine visit I agree to the above.

## 2019-01-02 ENCOUNTER — Telehealth (INDEPENDENT_AMBULATORY_CARE_PROVIDER_SITE_OTHER): Payer: Medicare Other | Admitting: Cardiology

## 2019-01-02 ENCOUNTER — Other Ambulatory Visit (HOSPITAL_COMMUNITY): Payer: Medicare Other

## 2019-01-02 ENCOUNTER — Other Ambulatory Visit: Payer: Self-pay

## 2019-01-02 DIAGNOSIS — Z95810 Presence of automatic (implantable) cardiac defibrillator: Secondary | ICD-10-CM

## 2019-01-02 NOTE — Progress Notes (Signed)
Electrophysiology TeleHealth Note   Due to national recommendations of social distancing due to COVID 19, an audio/video telehealth visit is felt to be most appropriate for this patient at this time.  See Epic message for the patient's consent to telehealth for [facility].   Date:  01/02/2019   ID:  [name] [name], DOB 02/03/1951, MRN [id number]  Location: patient's home  Provider location: [address], [name]  Evaluation Performed: Follow-up visit  PCP:  Kathyrn Drown, MD  Cardiologist:  Carlyle Dolly, MD  Electrophysiologist:  Dr Curt Bears  Chief Complaint:  CHF  History of Present Illness:    [name] [name] is a 68 y.o. male who presents via audio/video conferencing for a telehealth visit today.  Since last being seen in our clinic, the patient reports doing very well.  Today, he denies symptoms of palpitations, chest pain, shortness of breath,  lower extremity edema, dizziness, presyncope, or syncope.  The patient is otherwise without complaint today.  The patient denies symptoms of fevers, chills, cough, or new SOB worrisome for COVID 19.  He has a history of morbid obesity, hypertension, hyperlipidemia, type 2 diabetes, and ischemic cardiomyopathy with coronary artery disease status post bypass x6.  He had a Medtronic ICD implanted 05/18/2016.  He had a left bundle branch block that occurred surrounding TAVR 11/20/2017.  Today, denies symptoms of palpitations, chest pain, shortness of breath, orthopnea, PND, lower extremity edema, claudication, dizziness, presyncope, syncope, bleeding, or neurologic sequela. The patient is tolerating medications without difficulties. He feels fatigued. Otherwise without complaint.   Past Medical History:  Diagnosis Date  . AICD (automatic cardioverter/defibrillator) present 05/18/2016  . Anginal pain ([place])    occ none recent  . Aortic stenosis   . Arthritis   . CAD (coronary artery disease)   . Cardiomyopathy,  ischemic   . CHF (congestive heart failure) ([place])   . Chronic back pain   . Diabetes mellitus without complication ([place])   . Elevated PSA   . History of kidney stones   . Hyperlipidemia   . Morbid obesity ([place])   . Myocardial infarction ([place])   . OSA (obstructive sleep apnea)    does not wear CPAP, -order run out told need to redo  test  . Paroxysmal atrial fibrillation ([place]) 09/29/2018   This was found on event monitor October 2019  . 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 APPENDECTOMY    . BIOPSY  10/02/2018   Procedure: BIOPSY;  Surgeon: Daneil Dolin, MD;  [facility];  Service: Endoscopy;;  gastric  . CARDIAC CATHETERIZATION     stents  . CAROTID STENT     denies carotid stent  . CATARACT EXTRACTION, BILATERAL    . CORONARY ARTERY BYPASS GRAFT  12/23/1999   LIMA to LAD, SVG to ramus, sequential SVG to OM2-RPL, sequential SVG to AM-PDA  . EP IMPLANTABLE DEVICE N/A 05/18/2016   Procedure: ICD Implant;  Surgeon: Lawrence Mitch Meredith Leeds, MD;  Location: [facility] CV LAB;  Service: Cardiovascular;  Laterality: N/A;  . ESOPHAGOGASTRODUODENOSCOPY (EGD) WITH PROPOFOL N/A 10/02/2018   Procedure: ESOPHAGOGASTRODUODENOSCOPY (EGD) WITH PROPOFOL;  Surgeon: Daneil Dolin, MD;  [facility];  Service: Endoscopy;  Laterality: N/A;  . EYE SURGERY    . ingrown toenail N/A   . LEFT HEART CATHETERIZATION WITH CORONARY/GRAFT ANGIOGRAM N/A 12/21/2014   Procedure: LEFT HEART CATHETERIZATION WITH Beatrix Fetters;  Surgeon: Belva Crome, MD;  Location: [facility] CATH LAB;  Service: Cardiovascular;  Laterality: N/A;  . NASAL SEPTOPLASTY W/ TURBINOPLASTY Bilateral 04/25/2017   NASAL SEPTOPLASTY  WITH TURBINATE REDUCTION/notes 04/25/2017  . NASAL SEPTOPLASTY W/ TURBINOPLASTY Bilateral 04/25/2017   Procedure: NASAL SEPTOPLASTY WITH TURBINATE REDUCTION;  Surgeon: Leta Baptist, MD;  Location: [facility];  Service: ENT;  Laterality: Bilateral;  . RIGHT/LEFT HEART CATH AND CORONARY/GRAFT ANGIOGRAPHY N/A 10/05/2017   Procedure: RIGHT/LEFT HEART CATH AND CORONARY/GRAFT ANGIOGRAPHY;  Surgeon: Sherren Mocha, MD;  Location: [facility] CV LAB;  Service: Cardiovascular;  Laterality: N/A;  . TEE WITHOUT CARDIOVERSION N/A 11/20/2017   Procedure: TRANSESOPHAGEAL ECHOCARDIOGRAM (TEE);  Surgeon: Sherren Mocha, MD;  Location: [facility];  Service: Open Heart Surgery;  Laterality: N/A;  . TONSILLECTOMY    . TRANSCATHETER AORTIC VALVE REPLACEMENT, TRANSFEMORAL N/A 11/20/2017   Procedure: TRANSCATHETER AORTIC VALVE REPLACEMENT, TRANSFEMORAL;  Surgeon: Sherren Mocha, MD;  Location: [facility];  Service: Open Heart Surgery;  Laterality: N/A;    Current Outpatient Medications  Medication Sig Dispense Refill  . acetaminophen (TYLENOL) 500 MG tablet Take 1 tablet (500 mg total) by mouth 2 (two) times daily. (Patient taking differently: Take 500 mg by mouth as needed. ) 14 tablet 0  . carvedilol (COREG) 3.125 MG tablet Take 1 tablet (3.125 mg total) by mouth 2 (two) times daily with a meal. 60 tablet 3  . Ferrous Gluconate (IRON 27 PO) Take by mouth daily.    Marland Kitchen HYDROcodone-acetaminophen (NORCO) 10-325 MG tablet One bid prn pain 60 tablet 0  . LEVEMIR FLEXTOUCH 100 UNIT/ML Pen INJECT 60 UNITS INTO THE SKIN EACH EVENING. MAY TITRATE UP TO 80 UNITS. 15 mL 5  . linaclotide (LINZESS) 290 MCG CAPS capsule Take 1 capsule (290 mcg total) by mouth daily before breakfast. 30 capsule 5  . liver oil-zinc oxide (DESITIN) 40 % ointment Apply 1 application topically as needed for irritation.    . metolazone (ZAROXOLYN) 2.5 MG tablet TAKE ONE TABLET BY MOUTH DAILY. (Patient taking differently: every other day. ) 30 tablet 3  . Multiple  Vitamins-Minerals (CENTRUM SILVER PO) Take 1 tablet by mouth daily.     . nitroGLYCERIN (NITROSTAT) 0.4 MG SL tablet PLACE 1 TABLET UNDER TONGUE FOR CHEST PAIN. MAY REPEAT EVERY 5 MIN UPTO 3 DOSES-NO RELIEF,CALL 911. 25 tablet 6  . NOVOLOG FLEXPEN 100 UNIT/ML FlexPen INJECT 20 UNITS INTO THE SKIN THREE TIMES DAILY WITH MEALS. (Patient taking differently: 60 Units 2 (two) times daily. ) 15 mL 5  . pantoprazole (PROTONIX) 40 MG tablet Take 1 tablet (40 mg total) by mouth 2 (two) times daily. 60 tablet 3  . polycarbophil (FIBERCON) 625 MG tablet Take 2 tablets (1,250 mg total) by mouth daily with lunch. Hold if you have diarrhea (Patient taking differently: 3 tabs in the morning, 3 at noon and 2 in evening) 30 tablet 0  . Polyethyl Glycol-Propyl Glycol (SYSTANE) 0.4-0.3 % GEL ophthalmic gel Place 1 application into both eyes.    . potassium chloride SA (KLOR-CON M20) 20 MEQ tablet Take 3 qam, 3 at lunch, and 2 at supper 720 tablet 0  . Rivaroxaban (XARELTO) 15 MG TABS tablet Take 1 tablet (15 mg total) by mouth daily with supper. 30 tablet 5  .  rosuvastatin (CRESTOR) 40 MG tablet Take 1 tablet (40 mg total) by mouth daily. 90 tablet 1  . Simethicone (GAS-X PO) Take by mouth as needed.    . SURE COMFORT PEN NEEDLES 31G X 8 MM MISC USING 4 TIMES DAILY. 100 each 0  . torsemide (DEMADEX) 20 MG tablet 2 qam and 2 q 3pm (Patient taking differently: 3 qam and 3 q 3pm) 120 tablet 5  . traMADol (ULTRAM) 50 MG tablet Take one po Q 6-8 prn headache. 20 tablet 0  . trolamine salicylate (ASPERCREME) 10 % cream Apply 1 application topically 2 (two) times daily as needed for muscle pain.     Marland Kitchen spironolactone (ALDACTONE) 25 MG tablet Take 1 tablet (25 mg total) by mouth daily. (Patient taking differently: Take 25 mg by mouth every other day. ) 90 tablet 3   No current facility-administered medications for this visit.     Allergies:   Dyflex-g [dyphylline-guaifenesin] and Eliquis [apixaban]   Social History:  The  patient  reports that he quit smoking about 19 years ago. He has never used smokeless tobacco. He reports that he does not drink alcohol or use drugs.   Family History:  The patient's  family history includes CAD in his mother; Diabetes in his mother. He was adopted.   ROS:  Please see the history of present illness.   All other systems are personally reviewed and negative.    Exam:    Vital Signs:  BP 106/78   Pulse 83   Wt 273 lb 8 oz (124.1 kg)   BMI 38.15 kg/m   Well appearing, alert and conversant, regular work of breathing,  good skin color Eyes- anicteric, neuro- grossly intact, skin- no apparent rash or lesions or cyanosis, mouth- oral mucosa is pink    Labs/Other Tests and Data Reviewed:    Recent Labs: 09/12/2018: BNP 477.4 10/01/2018: ALT 11 10/23/2018: BUN 30; Creatinine, Ser 1.32; Magnesium 2.1; Potassium 3.6; Sodium 140 12/20/2018: Hemoglobin 13.0; Platelets 281   Wt Readings from Last 3 Encounters:  01/02/19 273 lb 8 oz (124.1 kg)  01/01/19 274 lb (124.3 kg)  11/13/18 283 lb (128.4 kg)     Other studies personally reviewed: Additional studies/ records that were reviewed today include: ECG 10/03/2018 personally reviewed Review of the above records today demonstrates:   Sinus rhythm, left bundle branch block  Last device remote is reviewed from [facility] PDF dated 11/25/18 which reveals normal device function, no arrhythmias    ASSESSMENT & PLAN:    1.  Ischemic cardiomyopathy: Ejection fraction 15%.  Medtronic single-chamber ICD implanted 05/18/16.  He is overall feeling well though he is fatigued.  I am concerned that his left bundle branch block is what is actually causing his fatigue.  This occurred post TAVR.  Due to that, we [place] Lenhard discuss with him at our next visit CRT upgrade.  2.  Coronary artery disease: No current chest pain  3.  Hypertension: Currently well controlled  4.  Aortic stenosis: Status post TAVR.  Plan per primary cardiology.  5.  Paroxysmal  atrial fibrillation: Has been switched to Xarelto, but he is underdosed.  Trula Frede increase to 20 mg.  This patients CHA2DS2-VASc Score and unadjusted Ischemic Stroke Rate (% per year) is equal to 4.8 % stroke rate/year from a score of 4  Above score calculated as 1 point each if present [CHF, HTN, DM, Vascular=MI/PAD/Aortic Plaque, Age if 65-74, or Male] Above score calculated as 2 points each if present [Age >  75, or Stroke/TIA/TE]      COVID 19 screen The patient denies symptoms of COVID 19 at this time.  The importance of social distancing was discussed today.  Follow-up: 3 months  Current medicines are reviewed at length with the patient today.   The patient does not have concerns regarding his medicines.  The following changes were made today: Increase Xarelto  Labs/ tests ordered today include:  No orders of the defined types were placed in this encounter.    Patient Risk:  after full review of this patients clinical status, I feel that they are at moderate risk at this time.  Today, I have spent 12 minutes with the patient with telehealth technology discussing CHF, ICD.    Signed, Aniesha Haughn Meredith Leeds, MD  01/02/2019 2:47 PM     [facility] [address] [place]  [address] [phone] (office) [phone] (fax)

## 2019-01-09 ENCOUNTER — Encounter (HOSPITAL_COMMUNITY): Payer: Self-pay

## 2019-01-09 ENCOUNTER — Ambulatory Visit (HOSPITAL_COMMUNITY): Admit: 2019-01-09 | Payer: Medicare Other | Admitting: Internal Medicine

## 2019-01-09 SURGERY — COLONOSCOPY WITH PROPOFOL
Anesthesia: Monitor Anesthesia Care

## 2019-01-10 ENCOUNTER — Other Ambulatory Visit: Payer: Self-pay | Admitting: Family Medicine

## 2019-01-13 NOTE — Telephone Encounter (Signed)
When the patient is on hydrocodone pain medicine on a regular basis it is not recommended to also be on tramadol-therefore denied prescription

## 2019-01-23 NOTE — Telephone Encounter (Signed)
Please send pharmacy denial on this prescription he is already on hydrocodone it is not recommended to be on 2 controlled pain medications

## 2019-01-27 ENCOUNTER — Ambulatory Visit: Payer: Medicare Other | Admitting: Cardiology

## 2019-02-03 ENCOUNTER — Ambulatory Visit: Payer: Medicare Other | Admitting: Family Medicine

## 2019-02-05 ENCOUNTER — Other Ambulatory Visit: Payer: Self-pay | Admitting: Family Medicine

## 2019-02-06 NOTE — Telephone Encounter (Signed)
Is pt on monthly rx for these, if so, w is he seeing dr Shauna Hugh three mo for pain meds??

## 2019-02-06 NOTE — Telephone Encounter (Signed)
[name] [name] and [name] [name] states that pt has no refills on Hydrocodone 10-325 mg tablets.

## 2019-02-07 ENCOUNTER — Telehealth: Payer: Self-pay

## 2019-02-07 ENCOUNTER — Other Ambulatory Visit: Payer: Self-pay | Admitting: Family Medicine

## 2019-02-07 MED ORDER — HYDROCODONE-ACETAMINOPHEN 10-325 MG PO TABS: ORAL_TABLET | ORAL | 0 refills | Status: DC

## 2019-02-07 NOTE — Telephone Encounter (Signed)
Wife called office, they are unable to find instructions for upcoming procedure scheduled for 02/18/19. She will come by office to pick up instructions next week. Informed her pt needs to start holding Iron 02/11/19 and do not take Xarelto 02/17/19. Instructions and pre-op appt letter placed at front desk.

## 2019-02-10 ENCOUNTER — Encounter (HOSPITAL_COMMUNITY): Payer: Self-pay

## 2019-02-12 ENCOUNTER — Other Ambulatory Visit: Payer: Self-pay

## 2019-02-12 ENCOUNTER — Telehealth: Payer: Self-pay | Admitting: Gastroenterology

## 2019-02-12 ENCOUNTER — Encounter (HOSPITAL_COMMUNITY)
Admission: RE | Admit: 2019-02-12 | Discharge: 2019-02-12 | Disposition: A | Discharge status: Home / Self Care | Payer: Medicare Other | Source: Ambulatory Visit | Attending: Gastroenterology | Admitting: Gastroenterology

## 2019-02-12 DIAGNOSIS — D509 Iron deficiency anemia, unspecified: Secondary | ICD-10-CM

## 2019-02-12 DIAGNOSIS — K250 Acute gastric ulcer with hemorrhage: Secondary | ICD-10-CM

## 2019-02-12 NOTE — Patient Instructions (Signed)
[name] [name]  02/12/2019     @PREFPERIOPPHARMACY @   Your procedure is scheduled on  02/18/2019  Report to [facility] at  15  A.M.  Call this number if you have problems the morning of surgery:  [phone]   Remember:  Follow the diet and prep instructions given to you by Dr Nona Dell office.                       Take these medicines the morning of surgery with A SIP OF WATER  Carvedilol, hydrocodone(if needed), protonix, tramadol(if needed). Take 1/2 of your night time insulin the night before your procedure. DO NOT take any medications for diabetes the morning of your procedure.    Do not wear jewelry, make-up or nail polish.  Do not wear lotions, powders, or perfumes, or deodorant.  Do not shave 48 hours prior to surgery.  Men may shave face and neck.  Do not bring valuables to the hospital.  [facility] is not responsible for any belongings or valuables.  Contacts, dentures or bridgework may not be worn into surgery.  Leave your suitcase in the car.  After surgery it may be brought to your room.  For patients admitted to the hospital, discharge time will be determined by your treatment team.  Patients discharged the day of surgery will not be allowed to drive home.   Name and phone number of your driver:   family Special instructions:  None  Please read over the following fact sheets that you were given. Anesthesia Post-op Instructions and Care and Recovery After Surgery       Upper Endoscopy, Adult, Care After This sheet gives you information about how to care for yourself after your procedure. Your health care provider may also give you more specific instructions. If you have problems or questions, contact your health care provider. What can I expect after the procedure? After the procedure, it is common to have:  A sore throat.  Mild stomach pain or discomfort.  Bloating.  Nausea. Follow these instructions at home:   Follow instructions from  your health care provider about what to eat or drink after your procedure.  Return to your normal activities as told by your health care provider. Ask your health care provider what activities are safe for you.  Take over-the-counter and prescription medicines only as told by your health care provider.  Do not drive for 24 hours if you were given a sedative during your procedure.  Keep all follow-up visits as told by your health care provider. This is important. Contact a health care provider if you have:  A sore throat that lasts longer than one day.  Trouble swallowing. Get help right away if:  You vomit blood or your vomit looks like coffee grounds.  You have: ? A fever. ? Bloody, black, or tarry stools. ? A severe sore throat or you cannot swallow. ? Difficulty breathing. ? Severe pain in your chest or abdomen. Summary  After the procedure, it is common to have a sore throat, mild stomach discomfort, bloating, and nausea.  Do not drive for 24 hours if you were given a sedative during the procedure.  Follow instructions from your health care provider about what to eat or drink after your procedure.  Return to your normal activities as told by your health care provider. This information is not intended to replace advice given to you by your health  care provider. Make sure you discuss any questions you have with your health care provider. Document Released: 02/13/2012 Document Revised: 01/14/2018 Document Reviewed: 01/14/2018 Elsevier Interactive Patient Education  2019 [employer].  Colonoscopy, Adult, Care After This sheet gives you information about how to care for yourself after your procedure. Your health care provider may also give you more specific instructions. If you have problems or questions, contact your health care provider. What can I expect after the procedure? After the procedure, it is common to have:  A small amount of blood in your stool for 24 hours  after the procedure.  Some gas.  Mild abdominal cramping or bloating. Follow these instructions at home: General instructions  For the first 24 hours after the procedure: ? Do not drive or use machinery. ? Do not sign important documents. ? Do not drink alcohol. ? Do your regular daily activities at a slower pace than normal. ? Eat soft, easy-to-digest foods.  Take over-the-counter or prescription medicines only as told by your health care provider. Relieving cramping and bloating   Try walking around when you have cramps or feel bloated.  Apply heat to your abdomen as told by your health care provider. Use a heat source that your health care provider recommends, such as a moist heat pack or a heating pad. ? Place a towel between your skin and the heat source. ? Leave the heat on for 20-30 minutes. ? Remove the heat if your skin turns bright red. This is especially important if you are unable to feel pain, heat, or cold. You may have a greater risk of getting burned. Eating and drinking   Drink enough fluid to keep your urine pale yellow.  Resume your normal diet as instructed by your health care provider. Avoid heavy or fried foods that are hard to digest.  Avoid drinking alcohol for as long as instructed by your health care provider. Contact a health care provider if:  You have blood in your stool 2-3 days after the procedure. Get help right away if:  You have more than a small spotting of blood in your stool.  You pass large blood clots in your stool.  Your abdomen is swollen.  You have nausea or vomiting.  You have a fever.  You have increasing abdominal pain that is not relieved with medicine. Summary  After the procedure, it is common to have a small amount of blood in your stool. You may also have mild abdominal cramping and bloating.  For the first 24 hours after the procedure, do not drive or use machinery, sign important documents, or drink alcohol.   Contact your health care provider if you have a lot of blood in your stool, nausea or vomiting, a fever, or increased abdominal pain. This information is not intended to replace advice given to you by your health care provider. Make sure you discuss any questions you have with your health care provider. Document Released: 03/28/2004 Document Revised: 06/06/2017 Document Reviewed: 10/26/2015 Elsevier Interactive Patient Education  2019 [place], Care After These instructions provide you with information about caring for yourself after your procedure. Your health care provider may also give you more specific instructions. Your treatment has been planned according to current medical practices, but problems sometimes occur. Call your health care provider if you have any problems or questions after your procedure. What can I expect after the procedure? After your procedure, you may:  Feel sleepy for several hours.  Feel  clumsy and have poor balance for several hours.  Feel forgetful about what happened after the procedure.  Have poor judgment for several hours.  Feel nauseous or vomit.  Have a sore throat if you had a breathing tube during the procedure. Follow these instructions at home: For at least 24 hours after the procedure:      Have a responsible adult stay with you. It is important to have someone help care for you until you are awake and alert.  Rest as needed.  Do not: ? Participate in activities in which you could fall or become injured. ? Drive. ? Use heavy machinery. ? Drink alcohol. ? Take sleeping pills or medicines that cause drowsiness. ? Make important decisions or sign legal documents. ? Take care of children on your own. Eating and drinking  Follow the diet that is recommended by your health care provider.  If you vomit, drink water, juice, or soup when you can drink without vomiting.  Make sure you have little or no nausea  before eating solid foods. General instructions  Take over-the-counter and prescription medicines only as told by your health care provider.  If you have sleep apnea, surgery and certain medicines can increase your risk for breathing problems. Follow instructions from your health care provider about wearing your sleep device: ? Anytime you are sleeping, including during daytime naps. ? While taking prescription pain medicines, sleeping medicines, or medicines that make you drowsy.  If you smoke, do not smoke without supervision.  Keep all follow-up visits as told by your health care provider. This is important. Contact a health care provider if:  You keep feeling nauseous or you keep vomiting.  You feel light-headed.  You develop a rash.  You have a fever. Get help right away if:  You have trouble breathing. Summary  For several hours after your procedure, you may feel sleepy and have poor judgment.  Have a responsible adult stay with you for at least 24 hours or until you are awake and alert. This information is not intended to replace advice given to you by your health care provider. Make sure you discuss any questions you have with your health care provider. Document Released: 12/05/2015 Document Revised: 03/30/2017 Document Reviewed: 12/05/2015 Elsevier Interactive Patient Education  2019 [employer].

## 2019-02-12 NOTE — Telephone Encounter (Signed)
[name] [name],  Dr Juliann Mule doc of day February 12, 2019 am. Received referral from [facility] please advise on scheduling.

## 2019-02-12 NOTE — Telephone Encounter (Signed)
Sorry, I dont have any hospital endoscopy appointments available within next several weeks, already have cases scheduled for July yellow block and inpatient week , please check if any other providers has availability? Thanks

## 2019-02-12 NOTE — Telephone Encounter (Signed)
It looks like a referral for a hospital EGD/colon. Please review and advise.

## 2019-02-12 NOTE — Pre-Procedure Instructions (Signed)
Patient in for PAT. His chart was reviewed by Dr Hilaria Ota, anesthesiologist. After reviewing his extensive cardiac history as well as his last cardiology note, Dr Hilaria Ota feels that [name] [name] should have his procedure at a larger medical facility where cardiology is in house 24/7. Explained this to patient and told him that Dr Oneida Alar office would be contacting him to let him know what the next step for him would be. He verbalized understanding of this. Interoffice note sent to Dr Oneida Alar about this. Will wait to hear from her prior to canceling procedure off the schedule.

## 2019-02-12 NOTE — Telephone Encounter (Signed)
PLEASE CALL PT. ANESTHESIA(DR. NABONSAL) REVIEWED HIS MEDICAL HISTORY AND DETERMINED HE'S TOO HIGH RISK OF ANESTHESIA TO HAVE A COLONOSCOPY AT [facility]. REFER TO [facility] FOR COLONOSCOPY/EDG WITH PROPOFOL. Dx: ULCER SURVEILLANCE, IRON DEFICIENCY ANEMIA.

## 2019-02-12 NOTE — Addendum Note (Signed)
Addended by: Hassan Rowan on: 02/12/2019 11:39 AM   Modules accepted: Orders

## 2019-02-12 NOTE — Telephone Encounter (Signed)
Called and informed pt. Referral sent to [facility] for TCS/EGD with Propofol via Epic. Procedure has already been cancelled for 02/18/19.

## 2019-02-13 NOTE — Telephone Encounter (Signed)
Patient has been scheduled with Tye Savoy RNP for next week to discuss

## 2019-02-13 NOTE — Telephone Encounter (Signed)
Dr Hilarie Fredrickson can you scope this patient on your block day 03/03/19?

## 2019-02-13 NOTE — Telephone Encounter (Signed)
Yes, okay for ECL on my July hospital block He needs an appt with an APP here before that to review history, discuss procedure and make decisions regarding his anticoagulation therapy

## 2019-02-14 ENCOUNTER — Other Ambulatory Visit (HOSPITAL_COMMUNITY): Payer: Medicare Other

## 2019-02-18 ENCOUNTER — Encounter (HOSPITAL_COMMUNITY): Payer: Self-pay

## 2019-02-18 ENCOUNTER — Ambulatory Visit (HOSPITAL_COMMUNITY): Admit: 2019-02-18 | Payer: Medicare Other | Admitting: Gastroenterology

## 2019-02-18 SURGERY — COLONOSCOPY WITH PROPOFOL
Anesthesia: Monitor Anesthesia Care

## 2019-02-19 ENCOUNTER — Ambulatory Visit: Payer: Medicare Other | Admitting: Nurse Practitioner

## 2019-02-19 ENCOUNTER — Encounter: Payer: Self-pay | Admitting: Thoracic Surgery (Cardiothoracic Vascular Surgery)

## 2019-02-24 ENCOUNTER — Ambulatory Visit: Payer: Medicare Other | Admitting: *Deleted

## 2019-02-25 LAB — CUP PACEART REMOTE DEVICE CHECK
Battery Remaining Longevity: 120 mo
Battery Voltage: 3 V
Brady Statistic RV Percent Paced: 0.01 %
Date Time Interrogation Session: 20200629103624
HighPow Impedance: 92 Ohm
Implantable Lead Implant Date: 20170921
Implantable Lead Location: 753860
Implantable Pulse Generator Implant Date: 20170921
Lead Channel Impedance Value: 380 Ohm
Lead Channel Impedance Value: 456 Ohm
Lead Channel Pacing Threshold Amplitude: 0.625 V
Lead Channel Pacing Threshold Pulse Width: 0.4 ms
Lead Channel Sensing Intrinsic Amplitude: 4 mV
Lead Channel Sensing Intrinsic Amplitude: 4 mV
Lead Channel Setting Pacing Amplitude: 2.5 V
Lead Channel Setting Pacing Pulse Width: 0.4 ms
Lead Channel Setting Sensing Sensitivity: 0.3 mV

## 2019-03-03 ENCOUNTER — Other Ambulatory Visit: Payer: Self-pay

## 2019-03-03 ENCOUNTER — Ambulatory Visit (HOSPITAL_COMMUNITY): Payer: Medicare Other | Attending: Internal Medicine

## 2019-03-03 DIAGNOSIS — Z952 Presence of prosthetic heart valve: Secondary | ICD-10-CM | POA: Insufficient documentation

## 2019-03-03 DIAGNOSIS — Z79899 Other long term (current) drug therapy: Secondary | ICD-10-CM | POA: Diagnosis not present

## 2019-03-03 DIAGNOSIS — E1121 Type 2 diabetes mellitus with diabetic nephropathy: Secondary | ICD-10-CM | POA: Diagnosis not present

## 2019-03-03 DIAGNOSIS — I10 Essential (primary) hypertension: Secondary | ICD-10-CM | POA: Diagnosis not present

## 2019-03-03 MED ORDER — PERFLUTREN LIPID MICROSPHERE
1.0000 mL | INTRAVENOUS | Status: AC | PRN
  Administered 2019-03-03: 2 mL via INTRAVENOUS

## 2019-03-04 LAB — HEPATIC FUNCTION PANEL
ALT: 7 IU/L (ref 0–44)
AST: 10 IU/L (ref 0–40)
Albumin: 3.9 g/dL (ref 3.8–4.8)
Alkaline Phosphatase: 107 IU/L (ref 39–117)
Bilirubin Total: 1 mg/dL (ref 0.0–1.2)
Bilirubin, Direct: 0.25 mg/dL (ref 0.00–0.40)
Total Protein: 7.2 g/dL (ref 6.0–8.5)

## 2019-03-04 LAB — BASIC METABOLIC PANEL
BUN/Creatinine Ratio: 15 (ref 10–24)
BUN: 16 mg/dL (ref 8–27)
CO2: 25 mmol/L (ref 20–29)
Calcium: 9.1 mg/dL (ref 8.6–10.2)
Chloride: 96 mmol/L (ref 96–106)
Creatinine, Ser: 1.1 mg/dL (ref 0.76–1.27)
GFR calc Af Amer: 79 mL/min/{1.73_m2} (ref >59)
GFR calc non Af Amer: 69 mL/min/{1.73_m2} (ref >59)
Glucose: 396 mg/dL — ABNORMAL HIGH (ref 65–99)
Potassium: 4.4 mmol/L (ref 3.5–5.2)
Sodium: 134 mmol/L (ref 134–144)

## 2019-03-04 LAB — LIPID PANEL
Chol/HDL Ratio: 4.6 ratio (ref 0.0–5.0)
Cholesterol, Total: 133 mg/dL (ref 100–199)
HDL: 29 mg/dL — ABNORMAL LOW (ref >39)
LDL Calculated: 73 mg/dL (ref 0–99)
Triglycerides: 155 mg/dL — ABNORMAL HIGH (ref 0–149)
VLDL Cholesterol Cal: 31 mg/dL (ref 5–40)

## 2019-03-04 LAB — HEMOGLOBIN A1C
Est. average glucose Bld gHb Est-mCnc: 326 mg/dL
Hgb A1c MFr Bld: 13 % — ABNORMAL HIGH (ref 4.8–5.6)

## 2019-03-05 ENCOUNTER — Other Ambulatory Visit: Payer: Self-pay | Admitting: Cardiovascular Disease

## 2019-03-07 ENCOUNTER — Other Ambulatory Visit: Payer: Self-pay

## 2019-03-07 ENCOUNTER — Ambulatory Visit (INDEPENDENT_AMBULATORY_CARE_PROVIDER_SITE_OTHER): Payer: Medicare Other | Admitting: Urology

## 2019-03-07 DIAGNOSIS — R972 Elevated prostate specific antigen [PSA]: Secondary | ICD-10-CM

## 2019-03-07 DIAGNOSIS — R351 Nocturia: Secondary | ICD-10-CM

## 2019-03-07 DIAGNOSIS — N401 Enlarged prostate with lower urinary tract symptoms: Secondary | ICD-10-CM

## 2019-03-24 ENCOUNTER — Encounter: Payer: Self-pay | Admitting: Family Medicine

## 2019-03-24 ENCOUNTER — Ambulatory Visit (INDEPENDENT_AMBULATORY_CARE_PROVIDER_SITE_OTHER): Payer: Medicare Other | Admitting: Family Medicine

## 2019-03-24 ENCOUNTER — Other Ambulatory Visit: Payer: Self-pay

## 2019-03-24 VITALS — BP 92/59 | Wt 270.5 lb

## 2019-03-24 DIAGNOSIS — I5042 Chronic combined systolic (congestive) and diastolic (congestive) heart failure: Secondary | ICD-10-CM | POA: Diagnosis not present

## 2019-03-24 DIAGNOSIS — E785 Hyperlipidemia, unspecified: Secondary | ICD-10-CM | POA: Diagnosis not present

## 2019-03-24 DIAGNOSIS — N182 Chronic kidney disease, stage 2 (mild): Secondary | ICD-10-CM

## 2019-03-24 DIAGNOSIS — E1122 Type 2 diabetes mellitus with diabetic chronic kidney disease: Secondary | ICD-10-CM

## 2019-03-24 DIAGNOSIS — I255 Ischemic cardiomyopathy: Secondary | ICD-10-CM | POA: Diagnosis not present

## 2019-03-24 DIAGNOSIS — I10 Essential (primary) hypertension: Secondary | ICD-10-CM | POA: Diagnosis not present

## 2019-03-24 DIAGNOSIS — M25571 Pain in right ankle and joints of right foot: Secondary | ICD-10-CM | POA: Diagnosis not present

## 2019-03-24 DIAGNOSIS — L03039 Cellulitis of unspecified toe: Secondary | ICD-10-CM | POA: Diagnosis not present

## 2019-03-24 DIAGNOSIS — G8929 Other chronic pain: Secondary | ICD-10-CM

## 2019-03-24 DIAGNOSIS — E1121 Type 2 diabetes mellitus with diabetic nephropathy: Secondary | ICD-10-CM | POA: Diagnosis not present

## 2019-03-24 DIAGNOSIS — R972 Elevated prostate specific antigen [PSA]: Secondary | ICD-10-CM | POA: Diagnosis not present

## 2019-03-24 MED ORDER — HYDROCODONE-ACETAMINOPHEN 10-325 MG PO TABS: ORAL_TABLET | ORAL | 0 refills | Status: DC

## 2019-03-24 MED ORDER — CEPHALEXIN 500 MG PO CAPS: ORAL_CAPSULE | ORAL | 0 refills | Status: DC

## 2019-03-24 NOTE — Progress Notes (Signed)
Subjective:    Patient ID: [name] [name], male    DOB: 16-Jul-1951, 68 y.o.   MRN: [id number] Very nice patient video HPI  Patient has been monitoring his weight is staying around 270 He also states he took a holiday from his diabetes medicine but recently started back on it and states sugars are down to 145 Recent lab work shows A1c under poor control Pt needing virtual visit to discuss blood work results. Pt states he is taking his insulin; states he just started back using it. Pt states he has been checking his sugar at least once a day. Pt sugar this morning was 145.  Patient denies any cardiac symptoms no chest tightness pressure pain shortness of breath states swelling under decent control  This patient was seen today for chronic pain  The medication list was reviewed and updated.   -Compliance with medication: Relates compliance with medicine  - Number patient states they take daily: Takes approximately 2 a day  -when was the last dose patient took?  Earlier today  The patient was advised the importance of maintaining medication and not using illegal substances with these.  Here for refills and follow up  The patient was educated that we can provide 3 monthly scripts for their medication, it is their responsibility to follow the instructions.  Side effects or complications from medications: Denies side effects  Patient is aware that pain medications are meant to minimize the severity of the pain to allow their pain levels to improve to allow for better function. They are aware of that pain medications cannot totally remove their pain.  Due for UDT ( at least once per year) : Later this year  He states the medication does help him with his pain control and states without it he would be had a very difficult time functioning      Virtual Visit via Video Note  I connected with [name] [name] on 03/24/19 at  1:10 PM EDT by a video enabled telemedicine application and  verified that I am speaking with the correct person using two identifiers.  Location: Patient:home Provider: office   I discussed the limitations of evaluation and management by telemedicine and the availability of in person appointments. The patient expressed understanding and agreed to proceed.  History of Present Illness:    Observations/Objective:   Assessment and Plan:   Follow Up Instructions:      I discussed the assessment and treatment plan with the patient. The patient was provided an opportunity to ask questions and all were answered. The patient agreed with the plan and demonstrated an understanding of the instructions.   The patient was advised to call back or seek an in-person evaluation if the symptoms worsen or if the condition fails to improve as anticipated.  I provided 25 minutes of non-face-to-face time during this encounter.   Vicente Males, LPN    Review of Systems  Constitutional: Negative for diaphoresis and fatigue.  HENT: Negative for congestion and rhinorrhea.   Respiratory: Negative for cough and shortness of breath.   Cardiovascular: Negative for chest pain and leg swelling.  Gastrointestinal: Negative for abdominal pain and diarrhea.  Skin: Negative for color change and rash.  Neurological: Negative for dizziness and headaches.  Psychiatric/Behavioral: Negative for behavioral problems and confusion.       Objective:   Physical Exam  Patient had virtual visit Appears to be in no distress Atraumatic Neuro able to relate and oriented No apparent resp  distress Color normal       Assessment & Plan:  1. Type 2 diabetes with nephropathy (HCC) Diabetes subpar control very important for patient to get back with his medicines on a regular basis follow his sugar send [place] some readings we will check an A1c in approximately 3 months with follow-up office visit - Ambulatory referral to Podiatry - Hemoglobin R4B - Basic Metabolic Panel  (BMET)  2. Essential hypertension Blood pressure a little low right now but he states he took a couple pain meds during the night otherwise blood pressure is been good continue current measures - Hemoglobin U3A - Basic Metabolic Panel (BMET)  3. Chronic combined systolic and diastolic CHF (congestive heart failure) (HCC) No active CHF symptoms currently right now under good control weight and medications  4. CKD stage 2 due to type 2 diabetes mellitus (HCC) Mild chronic kidney disease related to diabetes monitor periodically  5. Hyperlipidemia, unspecified hyperlipidemia type Hyperlipidemia continue current measures watch diet check labs periodically  6. Elevated PSA History of elevated PSA recommend for the patient to follow through with urology regarding this  7. Chronic pain of right ankle The patient was seen in followup for chronic pain. A review over at their current pain status was discussed. Drug registry was checked. Prescriptions were given. Discussion was held regarding the importance of compliance with medication as well as pain medication contract.  Time for questions regarding pain management plan occurred. Importance of regular followup visits was discussed. Patient was informed that medication may cause drowsiness and should not be combined  with other medications/alcohol or street drugs. Patient was cautioned that medication could cause drowsiness. If the patient feels medication is causing altered alertness then do not drive or operate dangerous equipment.  3 scripts were sent and patient will follow-up in 3 months  Patient does have an infected ingrown toenail antibiotics and referral to podiatry

## 2019-04-02 ENCOUNTER — Encounter: Payer: Self-pay | Admitting: Family Medicine

## 2019-04-07 ENCOUNTER — Other Ambulatory Visit: Payer: Self-pay | Admitting: Physician Assistant

## 2019-04-07 ENCOUNTER — Other Ambulatory Visit: Payer: Self-pay | Admitting: Family Medicine

## 2019-04-14 ENCOUNTER — Other Ambulatory Visit (HOSPITAL_COMMUNITY): Payer: Medicare Other

## 2019-04-21 ENCOUNTER — Other Ambulatory Visit: Payer: Self-pay | Admitting: Family Medicine

## 2019-05-08 ENCOUNTER — Other Ambulatory Visit: Payer: Self-pay | Admitting: Cardiovascular Disease

## 2019-05-10 ENCOUNTER — Other Ambulatory Visit: Payer: Self-pay | Admitting: Cardiovascular Disease

## 2019-05-16 ENCOUNTER — Other Ambulatory Visit: Payer: Self-pay

## 2019-05-16 ENCOUNTER — Ambulatory Visit (INDEPENDENT_AMBULATORY_CARE_PROVIDER_SITE_OTHER): Payer: Medicare Other | Admitting: Podiatry

## 2019-05-16 VITALS — Temp 97.3°F

## 2019-05-16 DIAGNOSIS — M79676 Pain in unspecified toe(s): Secondary | ICD-10-CM

## 2019-05-16 DIAGNOSIS — L600 Ingrowing nail: Secondary | ICD-10-CM | POA: Diagnosis not present

## 2019-05-16 NOTE — Patient Instructions (Signed)

## 2019-05-19 ENCOUNTER — Other Ambulatory Visit: Payer: Self-pay | Admitting: Family Medicine

## 2019-05-23 ENCOUNTER — Other Ambulatory Visit: Payer: Self-pay

## 2019-05-23 ENCOUNTER — Encounter: Payer: Self-pay | Admitting: Cardiology

## 2019-05-23 ENCOUNTER — Ambulatory Visit (INDEPENDENT_AMBULATORY_CARE_PROVIDER_SITE_OTHER): Payer: Medicare Other | Admitting: Cardiology

## 2019-05-23 VITALS — BP 123/83 | HR 88 | Temp 98.2°F | Ht 70.5 in | Wt 291.0 lb

## 2019-05-23 DIAGNOSIS — E782 Mixed hyperlipidemia: Secondary | ICD-10-CM

## 2019-05-23 DIAGNOSIS — I10 Essential (primary) hypertension: Secondary | ICD-10-CM | POA: Diagnosis not present

## 2019-05-23 DIAGNOSIS — I5023 Acute on chronic systolic (congestive) heart failure: Secondary | ICD-10-CM

## 2019-05-23 DIAGNOSIS — I255 Ischemic cardiomyopathy: Secondary | ICD-10-CM

## 2019-05-23 DIAGNOSIS — I25118 Atherosclerotic heart disease of native coronary artery with other forms of angina pectoris: Secondary | ICD-10-CM | POA: Diagnosis not present

## 2019-05-23 MED ORDER — TORSEMIDE 20 MG PO TABS: ORAL_TABLET | ORAL | 3 refills | Status: DC

## 2019-05-23 MED ORDER — RIVAROXABAN 20 MG PO TABS: 20.0000 mg | ORAL_TABLET | Freq: Every day | ORAL | 6 refills | Status: AC

## 2019-05-23 NOTE — Progress Notes (Signed)
Clinical Summary [name] [name] is a 68 y.o.male seen today for follow up of the following medical problems.   1. Chronic systolic HF - history of ICM, LVEF 10-15% by echo Jan 2018. Restricitive diastolic dysfunction.  - ICD followed by EP - admit Jan 2018 with acute on chronic CHF. Diuresed nearly 15 lbs. Discharge weight 297 lbs. From pcp note 10/13/16 down to 279 lbs.     - 02/2019 echo :LVEF 15-20%, mod RV dysfunction, normal AVR - being considered for possible CRT upgrade by EP.   - recent SOB, worst over the last 3 days. DOE with mild to moderate activities - no recent chest pain. +sinus congestion. Has been on intermittent abx with some improvement. No cough, no wheezing.  - has 20 lbs weight gain by our scale. Home scale 281 lbs.  - recent leg edema. Has missed some torsemide doses - has also missed some other meds. Takes torsemide    2. CAD History of CABG in 2001(LIMA to distal LAD, SVG to RI, SVG to OM 2, sequential SVG to our PLA, SVG to acute marginal, SVG to posterior descending artery  - recent pleuritic chest pain related to recent URI, worst with position and cough. . No cardiac like chest pain.    09/2017 cath: patent grafts 4/4, severe 3 vessel native disease - no recent chest pain.      3. HTN Compliant with meds  4. Hyperlipidemia - compliant with statin  02/2019 TC 133 TG 155 HDL 29 LDL 73  5. OSA - compliant with CPAP  6. Aortic stenosis - moderate by echo Jan 2018.  -no recent SOB/DOE,/chest pain/syncope  - s/p TAVR 10/2017 - 02/2019 echo :VEF 15-20%, mod RV dysfunction, normal AVR   7. PAF - incidental finding on device interogation - on xarelto, was to increase to 20mg  daily at last EP visit, he is unsure what dose he is taking. From CrCl today of 120 20mg  is appropriate dose.     Past Medical History:  Diagnosis Date  . AICD (automatic cardioverter/defibrillator) present 05/18/2016  . Anginal pain ([place])    occ none recent   . Aortic stenosis   . Arthritis   . CAD (coronary artery disease)   . Cardiomyopathy, ischemic   . CHF (congestive heart failure) ([place])   . Chronic back pain   . Diabetes mellitus without complication ([place])   . Elevated PSA   . History of kidney stones   . Hyperlipidemia   . Morbid obesity ([place])   . Myocardial infarction ([place])   . OSA (obstructive sleep apnea)    does not wear CPAP, -order run out told need to redo  test  . Paroxysmal atrial fibrillation ([place]) 09/29/2018   This was found on event monitor October 2019  . 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 Dyflex-G [Dyphylline-Guaifenesin] Hives  . Eliquis [Apixaban] Other (See Comments)    Side effects not a true allergy- he stated that it made him feel bad     Current Outpatient Medications  Medication Sig Dispense Refill  . acetaminophen (TYLENOL) 500 MG tablet Take 1 tablet (500 mg total) by mouth 2 (two) times daily. (Patient taking differently: Take  500 mg by mouth every 6 (six) hours as needed for moderate pain. ) 14 tablet 0  . calcium carbonate (TUMS - DOSED IN MG ELEMENTAL CALCIUM) 500 MG chewable tablet Chew 1 tablet by mouth 2 (two) times daily as needed for indigestion or heartburn.    . carvedilol (COREG) 3.125 MG tablet TAKE (1) TABLET BY MOUTH TWICE DAILY WITH A MEAL. 60 tablet 0  . cephALEXin (KEFLEX) 500 MG capsule Take one capsule po QID for 7 days 28 capsule 0  . ferrous sulfate 325 (65 FE) MG tablet Take 325 mg by mouth daily with breakfast.    . HYDROcodone-acetaminophen (NORCO) 10-325 MG tablet TAKE (1) TABLET BY MOUTH TWICE DAILY AS NEEDED FOR PAIN. 60 tablet 0  . HYDROcodone-acetaminophen (NORCO) 10-325 MG tablet Take one tablet po  twice daily prn pain 60 tablet 0  . HYDROcodone-acetaminophen (NORCO) 10-325 MG tablet Take one tablet po twice daily prn pain 60 tablet 0  . insulin aspart (NOVOLOG FLEXPEN) 100 UNIT/ML FlexPen Inject 60 Units into the skin 2 (two) times daily with a meal. 15 mL 5  . Insulin Detemir (LEVEMIR FLEXTOUCH) 100 UNIT/ML Pen Inject 60 Units into the skin at bedtime. 15 mL 0  . linaclotide (LINZESS) 290 MCG CAPS capsule Take 1 capsule (290 mcg total) by mouth daily before breakfast. 30 capsule 5  . liver oil-zinc oxide (DESITIN) 40 % ointment Apply 1 application topically as needed for irritation.    . metolazone (ZAROXOLYN) 2.5 MG tablet TAKE ONE TABLET BY MOUTH DAILY. (Patient taking differently: Take 2.5 mg by mouth every other day. ) 30 tablet 3  . Multiple Vitamins-Minerals (CENTRUM SILVER PO) Take 1 tablet by mouth daily.     . nitroGLYCERIN (NITROSTAT) 0.4 MG SL tablet PLACE 1 TABLET UNDER TONGUE FOR CHEST PAIN. MAY REPEAT EVERY 5 MIN UP TO 3 DOSES-NO RELIEF,CALL 911. 25 tablet 2  . pantoprazole (PROTONIX) 40 MG tablet Take 1 tablet (40 mg total) by mouth 2 (two) times daily. 60 tablet 3  . polycarbophil (FIBERCON) 625 MG tablet Take 2 tablets (1,250 mg total) by mouth daily with lunch. Hold if you have diarrhea (Patient taking differently: Take 3,125 mg by mouth 2 (two) times a day. 3 tabs in the morning, 3 at noon and 2 in evening) 30 tablet 0  . Polyethyl Glycol-Propyl Glycol (SYSTANE) 0.4-0.3 % GEL ophthalmic gel Place 1 application into both eyes daily.     . potassium chloride SA (KLOR-CON M20) 20 MEQ tablet Take 3 qam, 3 at lunch, and 2 at supper (Patient taking differently: Take 40-60 mEq by mouth See admin instructions. Take 60 mEq by mouth in the morning, 60 mEq at lunch and 40 mEq at supper) 720 tablet 0  . Rivaroxaban (XARELTO) 15 MG TABS tablet Take 1 tablet (15 mg total) by mouth daily with supper. 30 tablet 5  . rosuvastatin (CRESTOR) 40 MG tablet Take 1 tablet (40 mg total) by mouth  daily. 90 tablet 1  . spironolactone (ALDACTONE) 25 MG tablet TAKE 1 TABLET BY MOUTH ONCE A DAY. 90 tablet 2  . SURE COMFORT PEN NEEDLES 31G X 8 MM MISC USING 4 TIMES DAILY. 100 each 0  . torsemide (DEMADEX) 20 MG tablet 2 qam and 2 q 3pm (Patient taking differently: Take 40-60 mg by mouth See admin instructions. Take 60 mg by mouth in the morning and 40 mg in the afternoon) 120 tablet 5  . traMADol (ULTRAM) 50 MG tablet TAKE 1 TABLET BY MOUTH  EVERY 6 TO 8 HOURS AS NEEDED FOR HEADACHE. 20 tablet 0  . trolamine salicylate (ASPERCREME) 10 % cream Apply 1 application topically 2 (two) times daily as needed for muscle pain.      No current facility-administered medications for this visit.      Past Surgical History:  Procedure Laterality Date  . APPENDECTOMY    . BIOPSY  10/02/2018   Procedure: BIOPSY;  Surgeon: Daneil Dolin, MD;  [facility];  Service: Endoscopy;;  gastric  . CARDIAC CATHETERIZATION     stents  . CAROTID STENT     denies carotid stent  . CATARACT EXTRACTION, BILATERAL    . CORONARY ARTERY BYPASS GRAFT  12/23/1999   LIMA to LAD, SVG to ramus, sequential SVG to OM2-RPL, sequential SVG to AM-PDA  . EP IMPLANTABLE DEVICE N/A 05/18/2016   Procedure: ICD Implant;  Surgeon: Will Meredith Leeds, MD;  Location: [place] [facility];  Service: Cardiovascular;  Laterality: N/A;  . ESOPHAGOGASTRODUODENOSCOPY (EGD) WITH PROPOFOL N/A 10/02/2018   Procedure: ESOPHAGOGASTRODUODENOSCOPY (EGD) WITH PROPOFOL;  Surgeon: Daneil Dolin, MD;  [facility];  Service: Endoscopy;  Laterality: N/A;  . EYE SURGERY    . ingrown toenail N/A   . LEFT HEART CATHETERIZATION WITH CORONARY/GRAFT ANGIOGRAM N/A 12/21/2014   Procedure: LEFT HEART CATHETERIZATION WITH Beatrix Fetters;  Surgeon: Belva Crome, MD;  Location: [facility] C[facility] LAB;  Service: Cardiovascular;  Laterality: N/A;  . NASAL SEPTOPLASTY W/ TURBINOPLASTY Bilateral 04/25/2017   NASAL SEPTOPLASTY WITH TURBINATE  REDUCTION/notes 04/25/2017  . NASAL SEPTOPLASTY W/ TURBINOPLASTY Bilateral 04/25/2017   Procedure: NASAL SEPTOPLASTY WITH TURBINATE REDUCTION;  Surgeon: Leta Baptist, MD;  Location: [place];  Service: ENT;  Laterality: Bilateral;  . RIGHT/LEFT HEART CATH AND CORONARY/GRAFT ANGIOGRAPHY N/A 10/05/2017   Procedure: RIGHT/LEFT HEART CATH AND CORONARY/GRAFT ANGIOGRAPHY;  Surgeon: Sherren Mocha, MD;  Location: [place] CV LAB;  Service: Cardiovascular;  Laterality: N/A;  . TEE WITHOUT CARDIOVERSION N/A 11/20/2017   Procedure: TRANSESOPHAGEAL ECHOCARDIOGRAM (TEE);  Surgeon: Sherren Mocha, MD;  Location: [place];  Service: Open Heart Surgery;  Laterality: N/A;  . TONSILLECTOMY    . TRANSCATHETER AORTIC VALVE REPLACEMENT, TRANSFEMORAL N/A 11/20/2017   Procedure: TRANSCATHETER AORTIC VALVE REPLACEMENT, TRANSFEMORAL;  Surgeon: Sherren Mocha, MD;  Location: [place];  Service: Open Heart Surgery;  Laterality: N/A;     Allergies  Allergen Reactions  . Dyflex-G [Dyphylline-Guaifenesin] Hives  . Eliquis [Apixaban] Other (See Comments)    Side effects not a true allergy- he stated that it made him feel bad      Family History  Adopted: Yes  Problem Relation Age of Onset  . Diabetes Mother   . CAD Mother   . Colon cancer Neg Hx   . Colon polyps Neg Hx      Social History Mr. Hansen reports that he quit smoking about 19 years ago. He has never used smokeless tobacco. Mr. Lerette reports no history of alcohol use.   Review of Systems CONSTITUTIONAL: No weight loss, fever, chills, weakness or fatigue.  HEENT: Eyes: No visual loss, blurred vision, double vision or yellow sclerae.No hearing loss, sneezing, congestion, runny nose or sore throat.  SKIN: No rash or itching.  CARDIOVASCULAR: per hpi RESPIRATORY: No shortness of breath, cough or sputum.  GASTROINTESTINAL: No anorexia, nausea, vomiting or diarrhea. No abdominal pain or blood.  GENITOURINARY: No burning on urination, no polyuria NEUROLOGICAL:  No headache, dizziness, syncope, paralysis, ataxia, numbness or tingling in the extremities. No change  in bowel or bladder control.  MUSCULOSKELETAL: No muscle, back pain, joint pain or stiffness.  LYMPHATICS: No enlarged nodes. No history of splenectomy.  PSYCHIATRIC: No history of depression or anxiety.  ENDOCRINOLOGIC: No reports of sweating, cold or heat intolerance. No polyuria or polydipsia.  Marland Kitchen   Physical Examination Today's Vitals   05/23/19 1015  BP: 123/83  Pulse: 88  Temp: 98.2 F (36.8 C)  Weight: 291 lb (132 kg)  Height: 5' 10.5" (1.791 m)   Body mass index is 41.16 kg/m.  Gen: resting comfortably, no acute distress HEENT: no scleral icterus, pupils equal round and reactive, no palptable cervical adenopathy,  CV: RRR, no m/r/g, no jvd Resp: crackles bilateral bases GI: abdomen is soft, non-tender, non-distended, normal bowel sounds, no hepatosplenomegaly MSK: extremities are warm, 2+ bilateral LE edema Skin: warm, no rash Neuro:  no focal deficits Psych: appropriate affect   Diagnostic Studies  Jan 2018 echo Study Conclusions  - Left ventricle: The cavity size was normal. Wall thickness was normal. The estimated ejection fraction was in the range of 10% to 15%. Diffuse hypokinesis. Doppler parameters are consistent with restrictive physiology, indicative of decreased left ventricular diastolic compliance and/or increased left atrial pressure. Doppler parameters are consistent with high ventricular filling pressure. - Aortic valve: Moderately calcified annulus. Moderately thickened leaflets. There was moderate stenosis. AV gradients decreased in the setting of severe LV systolic dysfunction. AVA VTI and dimensionless index support moderate aortic stenosis. Mean gradient (S): 17 mm Hg. Valve area (VTI): 1.2 cm^2. Valve area (Vmax): 1.1 cm^2. - Left atrium: The atrium was severely dilated. - Pulmonary arteries: Systolic pressure was  moderately increased. PA peak pressure: 58 mm Hg (S). - Inferior vena cava: The vessel was dilated. The respirophasic diameter changes were blunted (<50%), consistent with elevated central venous pressure. - Technically difficult study. Echocontrast was used to enhance visualization.   09/2017 cath  Severe three-vessel coronary artery disease with total occlusion of the RCA, total occlusion of the left circumflex, and total occlusion of the LAD just after the first diagonal 2.  Status post aortocoronary bypass surgery with patent grafts including LIMA to LAD, saphenous vein graft to ramus intermedius, saphenous vein graft to second obtuse marginal, and saphenous vein graft to right PDA 3.  Calcified, restricted aortic valve under plain fluoroscopy, with mean transvalvular gradient 27 mmHg  Recommendation continued evaluation for progressive aortic stenosis suspicious for low flow low gradient aortic stenosis, CT angiography of the heart and peripheral vessels will be ordered and multidisciplinary evaluation by cardiac surgery.  Assessment and Plan  1. Acute on chronic systolic HF - volume overloaded, poor compliance with diuretics recently - he will take metolazone 2.5mg  once today, then once weekly prn. Take torsemide 60mg  in AM and 40mg  in PM - BMET/Mg in 2 weeks - he is to call us in 1 week with weights.    2. CAD No symptoms, continue current meds  3. HTN At goal, continue current meds  4. Hyperlipidemia - at goal, continue statin   5. Aortic stenosis - s/p TAVR, normal functoining AVR by 02/2019 echo - continue to monitor  6. Afib - he is to call and confirm dosing of xarelto, should be 20mg  daily   F/u 1 month     Arnoldo Lenis, M.D.

## 2019-05-23 NOTE — Patient Instructions (Addendum)
Medication Instructions:  TAKE TORSEMIDE 60 MG IN THE MORNING & TAKE 40 MG IN THE EVENING  TAKE1 METOLAZONE 2.5 MG TODAY & THEN TAKE 1 TABLET WEEKLY   Labwork: 2 WEEKS   BMET MAGNESIUM   Testing/Procedures: NONE  Follow-Up: Your physician recommends that you schedule a follow-up appointment in: 1 MONTH    Any Other Special Instructions Will Be Listed Below (If Applicable). PLEASE CALL TO INFORM DR. BRANCH ON YOUR DOSE OF XARELTO    PLEASE CALL NEXT WEEK WITH YOUR DAILY WEIGHTS   If you need a refill on your cardiac medications before your next appointment, please call your pharmacy.

## 2019-05-28 NOTE — Progress Notes (Signed)
Subjective:  Patient ID: [name] [name], male    DOB: October 22, 1950,  MRN: [id number]  Chief Complaint  Patient presents with  . Nail Problem    Ingrown nail left foot 1st toenail medial border, had pus drainage 2 months ago. Pt took Keflex for this.    68 y.o. male presents with the above complaint. Hx as above.   Review of Systems: Negative except as noted in the HPI. Denies N/V/F/Ch.  Past Medical History:  Diagnosis Date  . AICD (automatic cardioverter/defibrillator) present 05/18/2016  . Anginal pain ([place])    occ none recent  . Aortic stenosis   . Arthritis   . CAD (coronary artery disease)   . Cardiomyopathy, ischemic   . CHF (congestive heart failure) ([place])   . Chronic back pain   . Diabetes mellitus without complication ([place])   . Elevated PSA   . History of kidney stones   . Hyperlipidemia   . Morbid obesity ([place])   . Myocardial infarction ([place])   . OSA (obstructive sleep apnea)    does not wear CPAP, -order run out told need to redo  test  . Paroxysmal atrial fibrillation ([place]) 09/29/2018   This was found on event monitor October 2019  . 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  acetaminophen (TYLENOL) 500 MG tablet, Take 1 tablet (500 mg total) by mouth 2 (two) times daily. (Patient taking differently: Take 500 mg by mouth every 6 (six) hours as needed for moderate pain. ), Disp: 14 tablet, Rfl: 0 .  calcium carbonate (TUMS - DOSED IN MG ELEMENTAL CALCIUM) 500 MG chewable tablet, Chew 1 tablet by mouth 2 (two) times daily as needed for indigestion or heartburn., Disp: , Rfl:  .  carvedilol (COREG) 3.125 MG tablet, TAKE (1) TABLET BY MOUTH TWICE DAILY WITH A MEAL., Disp: 60 tablet, Rfl: 0 .  cephALEXin  (KEFLEX) 500 MG capsule, Take one capsule po QID for 7 days, Disp: 28 capsule, Rfl: 0 .  ferrous sulfate 325 (65 FE) MG tablet, Take 325 mg by mouth daily with breakfast., Disp: , Rfl:  .  HYDROcodone-acetaminophen (NORCO) 10-325 MG tablet, TAKE (1) TABLET BY MOUTH TWICE DAILY AS NEEDED FOR PAIN., Disp: 60 tablet, Rfl: 0 .  HYDROcodone-acetaminophen (NORCO) 10-325 MG tablet, Take one tablet po twice daily prn pain, Disp: 60 tablet, Rfl: 0 .  insulin aspart (NOVOLOG FLEXPEN) 100 UNIT/ML FlexPen, Inject 60 Units into the skin 2 (two) times daily with a meal., Disp: 15 mL, Rfl: 5 .  Insulin Detemir (LEVEMIR FLEXTOUCH) 100 UNIT/ML Pen, Inject 60 Units into the skin at bedtime., Disp: 15 mL, Rfl: 0 .  linaclotide (LINZESS) 290 MCG CAPS capsule, Take 1 capsule (290 mcg total) by mouth daily before breakfast., Disp: 30 capsule, Rfl: 5 .  liver oil-zinc oxide (DESITIN) 40 % ointment, Apply 1 application topically as needed for irritation., Disp: , Rfl:  .  metolazone (ZAROXOLYN) 2.5 MG tablet, TAKE ONE TABLET BY MOUTH DAILY. (Patient taking differently: Take 2.5 mg by mouth every other day. ), Disp: 30 tablet, Rfl: 3 .  Multiple Vitamins-Minerals (CENTRUM SILVER PO), Take 1 tablet by mouth daily. , Disp: , Rfl:  .  nitroGLYCERIN (NITROSTAT) 0.4 MG SL  tablet, PLACE 1 TABLET UNDER TONGUE FOR CHEST PAIN. MAY REPEAT EVERY 5 MIN UP TO 3 DOSES-NO RELIEF,CALL 911., Disp: 25 tablet, Rfl: 2 .  pantoprazole (PROTONIX) 40 MG tablet, Take 1 tablet (40 mg total) by mouth 2 (two) times daily., Disp: 60 tablet, Rfl: 3 .  polycarbophil (FIBERCON) 625 MG tablet, Take 2 tablets (1,250 mg total) by mouth daily with lunch. Hold if you have diarrhea (Patient taking differently: Take 3,125 mg by mouth 2 (two) times a day. 3 tabs in the morning, 3 at noon and 2 in evening), Disp: 30 tablet, Rfl: 0 .  Polyethyl Glycol-Propyl Glycol (SYSTANE) 0.4-0.3 % GEL ophthalmic gel, Place 1 application into both eyes daily. , Disp: , Rfl:  .   potassium chloride SA (KLOR-CON M20) 20 MEQ tablet, Take 3 qam, 3 at lunch, and 2 at supper (Patient taking differently: Take 40-60 mEq by mouth See admin instructions. Take 60 mEq by mouth in the morning, 60 mEq at lunch and 40 mEq at supper), Disp: 720 tablet, Rfl: 0 .  rosuvastatin (CRESTOR) 40 MG tablet, Take 1 tablet (40 mg total) by mouth daily., Disp: 90 tablet, Rfl: 1 .  spironolactone (ALDACTONE) 25 MG tablet, TAKE 1 TABLET BY MOUTH ONCE A DAY., Disp: 90 tablet, Rfl: 2 .  SURE COMFORT PEN NEEDLES 31G X 8 MM MISC, USING 4 TIMES DAILY., Disp: 100 each, Rfl: 0 .  trolamine salicylate (ASPERCREME) 10 % cream, Apply 1 application topically 2 (two) times daily as needed for muscle pain. , Disp: , Rfl:  .  rivaroxaban (XARELTO) 20 MG TABS tablet, Take 1 tablet (20 mg total) by mouth daily with supper., Disp: 30 tablet, Rfl: 6 .  torsemide (DEMADEX) 20 MG tablet, TAKE 60 MG IN THE MORNING & TAKE 40 MG IN THE EVENING., Disp: 450 tablet, Rfl: 3 .  traMADol (ULTRAM) 50 MG tablet, TAKE 1 TABLET BY MOUTH EVERY 6 TO 8 HOURS AS NEEDED FOR HEADACHE., Disp: 20 tablet, Rfl: 0  Social History   Tobacco Use  Smoking Status Former Smoker  . Quit date: 08/29/1999  . Years since quitting: 19.7  Smokeless Tobacco Never Used  Tobacco Comment   couldn't remember date    Allergies  Allergen Reactions  . Dyflex-G [Dyphylline-Guaifenesin] Hives  . Eliquis [Apixaban] Other (See Comments)    Side effects not a true allergy- he stated that it made him feel bad   Objective:   Vitals:   05/16/19 0933  Temp: (!) 97.3 F (36.3 C)   There is no height or weight on file to calculate BMI. Constitutional Well developed. Well nourished.  Vascular Dorsalis pedis pulses palpable bilaterally. Posterior tibial pulses palpable bilaterally. Capillary refill normal to all digits.  No cyanosis or clubbing noted. Pedal hair growth normal.  Neurologic Normal speech. Oriented to person, place, and time. Epicritic  sensation to light touch grossly present bilaterally.  Dermatologic Painful ingrowing nail at medial nail borders of the hallux nail right. No other open wounds. No skin lesions.  Orthopedic: Normal joint ROM without pain or crepitus bilaterally. No visible deformities. No bony tenderness.   Radiographs: None Assessment:   1. Ingrown nail   2. Pain around toenail    Plan:  Patient was evaluated and treated and all questions answered.  Ingrown Nail, right -Patient elects to proceed with minor surgery to remove ingrown toenail removal today. Consent reviewed and signed by patient. -Ingrown nail excised. See procedure note. -Educated on post-procedure care including soaking. Written instructions provided  and reviewed. -Patient to follow up in 2 weeks for nail check.  Procedure: Excision of Ingrown Toenail Location: Right 1st medial nail borders. Anesthesia: Lidocaine 1% plain; 1.5 mL and Marcaine 0.5% plain; 1.5 mL, digital block. Skin Prep: Betadine. Dressing: Silvadene; telfa; dry, sterile, compression dressing. Technique: Following skin prep, the toe was exsanguinated and a tourniquet was secured at the base of the toe. The affected nail border was freed, split with a nail splitter, and excised. Chemical matrixectomy was then performed with phenol and irrigated out with alcohol. The tourniquet was then removed and sterile dressing applied. Disposition: Patient tolerated procedure well. Patient to return in 2 weeks for follow-up.   Return in about 2 weeks (around 05/30/2019) for Nail Check.

## 2019-05-30 ENCOUNTER — Other Ambulatory Visit: Payer: Self-pay

## 2019-05-30 ENCOUNTER — Ambulatory Visit (INDEPENDENT_AMBULATORY_CARE_PROVIDER_SITE_OTHER): Payer: Medicare Other | Admitting: Podiatry

## 2019-05-30 DIAGNOSIS — M79676 Pain in unspecified toe(s): Secondary | ICD-10-CM

## 2019-05-30 DIAGNOSIS — L600 Ingrowing nail: Secondary | ICD-10-CM

## 2019-06-05 ENCOUNTER — Other Ambulatory Visit: Payer: Self-pay | Admitting: Family Medicine

## 2019-06-09 DIAGNOSIS — E1121 Type 2 diabetes mellitus with diabetic nephropathy: Secondary | ICD-10-CM | POA: Diagnosis not present

## 2019-06-09 DIAGNOSIS — I10 Essential (primary) hypertension: Secondary | ICD-10-CM | POA: Diagnosis not present

## 2019-06-10 LAB — BASIC METABOLIC PANEL
BUN/Creatinine Ratio: 21 (ref 10–24)
BUN: 26 mg/dL (ref 8–27)
CO2: 27 mmol/L (ref 20–29)
Calcium: 9.4 mg/dL (ref 8.6–10.2)
Chloride: 93 mmol/L — ABNORMAL LOW (ref 96–106)
Creatinine, Ser: 1.23 mg/dL (ref 0.76–1.27)
GFR calc Af Amer: 69 mL/min/{1.73_m2} (ref >59)
GFR calc non Af Amer: 60 mL/min/{1.73_m2} (ref >59)
Glucose: 189 mg/dL — ABNORMAL HIGH (ref 65–99)
Potassium: 3.1 mmol/L — ABNORMAL LOW (ref 3.5–5.2)
Sodium: 139 mmol/L (ref 134–144)

## 2019-06-10 LAB — HEMOGLOBIN A1C
Est. average glucose Bld gHb Est-mCnc: 206 mg/dL
Hgb A1c MFr Bld: 8.8 % — ABNORMAL HIGH (ref 4.8–5.6)

## 2019-06-12 ENCOUNTER — Other Ambulatory Visit: Payer: Self-pay | Admitting: Family Medicine

## 2019-06-12 ENCOUNTER — Other Ambulatory Visit: Payer: Self-pay | Admitting: *Deleted

## 2019-06-12 DIAGNOSIS — E876 Hypokalemia: Secondary | ICD-10-CM

## 2019-06-12 MED ORDER — POTASSIUM CHLORIDE CRYS ER 20 MEQ PO TBCR: EXTENDED_RELEASE_TABLET | ORAL | 0 refills | Status: DC

## 2019-06-12 NOTE — Telephone Encounter (Signed)
This +2 refills needs virtual visit by the end of the year

## 2019-06-13 ENCOUNTER — Other Ambulatory Visit: Payer: Self-pay

## 2019-06-13 ENCOUNTER — Encounter: Payer: Self-pay | Admitting: Family Medicine

## 2019-06-13 ENCOUNTER — Encounter: Payer: Self-pay | Admitting: Nurse Practitioner

## 2019-06-13 ENCOUNTER — Ambulatory Visit (INDEPENDENT_AMBULATORY_CARE_PROVIDER_SITE_OTHER): Payer: Medicare Other | Admitting: Family Medicine

## 2019-06-13 VITALS — BP 120/80 | Temp 97.2°F | Ht 70.5 in | Wt 283.0 lb

## 2019-06-13 DIAGNOSIS — R1032 Left lower quadrant pain: Secondary | ICD-10-CM | POA: Diagnosis not present

## 2019-06-13 DIAGNOSIS — I255 Ischemic cardiomyopathy: Secondary | ICD-10-CM | POA: Diagnosis not present

## 2019-06-13 NOTE — Progress Notes (Signed)
   Subjective:    Patient ID: [name] [name], male    DOB: 23-Jan-1951, 68 y.o.   MRN: [id number]  Abdominal Pain This is a recurrent problem. Episode onset: 2nd time in 9 weeks. The pain is located in the LLQ.  BM starts out hard and ends up as diarrhea. Has been going on for awhile.   Patient does his best but he is a challenging historian.  The best I can tell is he has had some transient left lower quadrant discomfort.  He felt like his abdomen got hard during this related time.  He had a bit of a loose stool.  No fever no chills no vomiting.  Reports that he feels much better now.  He is slated to have a colonoscopy at one point but he is not sure exactly when.  No fever no chills no cough  Review of Systems  Gastrointestinal: Positive for abdominal pain.       Objective:   Physical Exam  Alert talkative no acute distress lungs clear heart regular rhythm abdomen no discrete tenderness quite obese.  Exam of lower panniculitis reveals no obvious hernia or deformity or mass.  Bowel sounds good.  Nontender currently.      Assessment & Plan:  Impression somewhat mysterious recurrent impressive left lower abdominal pain.  Now resolved.  Due to have colonoscopy I have encouraged him to go ahead and get it done.  Warning signs discussed

## 2019-06-16 ENCOUNTER — Other Ambulatory Visit: Payer: Self-pay | Admitting: Family Medicine

## 2019-06-16 ENCOUNTER — Telehealth: Payer: Self-pay | Admitting: *Deleted

## 2019-06-16 ENCOUNTER — Other Ambulatory Visit: Payer: Self-pay | Admitting: Cardiovascular Disease

## 2019-06-16 ENCOUNTER — Ambulatory Visit (INDEPENDENT_AMBULATORY_CARE_PROVIDER_SITE_OTHER): Payer: Medicare Other | Admitting: *Deleted

## 2019-06-16 DIAGNOSIS — I5042 Chronic combined systolic (congestive) and diastolic (congestive) heart failure: Secondary | ICD-10-CM

## 2019-06-16 DIAGNOSIS — I480 Paroxysmal atrial fibrillation: Secondary | ICD-10-CM | POA: Diagnosis not present

## 2019-06-16 LAB — CUP PACEART REMOTE DEVICE CHECK
Battery Remaining Longevity: 115 mo
Battery Voltage: 3.01 V
Brady Statistic RV Percent Paced: 0.01 %
Date Time Interrogation Session: 20201019012443
HighPow Impedance: 84 Ohm
Implantable Lead Implant Date: 20170921
Implantable Lead Location: 753860
Implantable Pulse Generator Implant Date: 20170921
Lead Channel Impedance Value: 342 Ohm
Lead Channel Impedance Value: 437 Ohm
Lead Channel Pacing Threshold Amplitude: 0.875 V
Lead Channel Pacing Threshold Pulse Width: 0.4 ms
Lead Channel Sensing Intrinsic Amplitude: 1.875 mV
Lead Channel Sensing Intrinsic Amplitude: 1.875 mV
Lead Channel Setting Pacing Amplitude: 2.5 V
Lead Channel Setting Pacing Pulse Width: 0.4 ms
Lead Channel Setting Sensing Sensitivity: 0.3 mV

## 2019-06-16 MED ORDER — HYDROCODONE-ACETAMINOPHEN 10-325 MG PO TABS: ORAL_TABLET | ORAL | 0 refills | Status: DC

## 2019-06-16 NOTE — Telephone Encounter (Signed)
Discussed with pt. Pt verbalized understanding. He states to call wife to schedule a virtual appt.

## 2019-06-16 NOTE — Telephone Encounter (Signed)
Please call pt's wife and schedule a virtual appt within one month with dr Nicki Reaper for pain management. Pt states his wife knows how to do the virtual appts and to call her to see when she will be off work to help him to do the visit. He states better to call at work [phone] but can also try cell if she does not answer. [phone] is the number pt gave for her cell but in epic her cell is [phone]. Not sure which one is correct.

## 2019-06-16 NOTE — Telephone Encounter (Deleted)
-----   Message from Will Meredith Leeds, MD sent at 06/16/2019  2:56 PM EDT ----- Abnormal device interrogation reviewed.  Lead parameters and battery status stable.  R waves trended down, concern over sensing V arrhythmias. Needs interrogation in device clinic to confirm, may need unipolar sensing if no noise.

## 2019-06-16 NOTE — Telephone Encounter (Signed)
I will send in one prescription but he will need to do a follow-up pain medicine visit either virtual or in person within 1 month Virtual will be fine if okay with patient

## 2019-06-16 NOTE — Telephone Encounter (Signed)
Requesting a refill on hydrocodone. States he averages one per day. Sometimes has to take 2 per day and some days he doesn't take any.  Has enough for 8 more days. Last med check up July 27 [place] [facility]

## 2019-06-16 NOTE — Telephone Encounter (Signed)
-----   Message from Will Meredith Leeds, MD sent at 06/16/2019  2:56 PM EDT ----- Abnormal device interrogation reviewed.  Lead parameters and battery status stable.  R waves trended down, concern over sensing V arrhythmias. Needs interrogation in device clinic to confirm, may need unipolar sensing if no noise.

## 2019-06-16 NOTE — Telephone Encounter (Signed)
LMOVM at home and cell numbers requesting call back to DC. Direct number and office hours given.

## 2019-06-17 NOTE — Telephone Encounter (Signed)
Spoke with patient and wife. They are agreeable to an appointment with Tommye Standard, PA, on 06/17/19. Will assess manual bipolar sensing and integrated bipolar sensing at visit. They are aware appointment is at the [facility] office, patient's wife will need to be present as patient is [facility]. No further questions at this time.

## 2019-06-17 NOTE — Telephone Encounter (Signed)
° °  Patient is returning call to device clinic

## 2019-06-24 ENCOUNTER — Ambulatory Visit (INDEPENDENT_AMBULATORY_CARE_PROVIDER_SITE_OTHER): Payer: Medicare Other | Admitting: Nurse Practitioner

## 2019-06-24 ENCOUNTER — Telehealth: Payer: Self-pay

## 2019-06-24 ENCOUNTER — Encounter: Payer: Self-pay | Admitting: Nurse Practitioner

## 2019-06-24 VITALS — BP 122/82 | HR 88 | Temp 97.4°F | Ht 70.5 in | Wt 286.2 lb

## 2019-06-24 DIAGNOSIS — K279 Peptic ulcer, site unspecified, unspecified as acute or chronic, without hemorrhage or perforation: Secondary | ICD-10-CM | POA: Diagnosis not present

## 2019-06-24 DIAGNOSIS — K922 Gastrointestinal hemorrhage, unspecified: Secondary | ICD-10-CM

## 2019-06-24 DIAGNOSIS — Z1211 Encounter for screening for malignant neoplasm of colon: Secondary | ICD-10-CM | POA: Diagnosis not present

## 2019-06-24 DIAGNOSIS — I255 Ischemic cardiomyopathy: Secondary | ICD-10-CM

## 2019-06-24 DIAGNOSIS — I5022 Chronic systolic (congestive) heart failure: Secondary | ICD-10-CM | POA: Diagnosis not present

## 2019-06-24 DIAGNOSIS — Z7901 Long term (current) use of anticoagulants: Secondary | ICD-10-CM

## 2019-06-24 MED ORDER — NA SULFATE-K SULFATE-MG SULF 17.5-3.13-1.6 GM/177ML PO SOLN: ORAL | 0 refills | Status: DC

## 2019-06-24 NOTE — Telephone Encounter (Signed)
[facility] Pre-operative Risk Assessment     Request for surgical clearance:     Endoscopy Procedure  What type of surgery is being performed?     Endo/Colon  When is this surgery scheduled?     07/31/19  What type of clearance is required ?   Pharmacy  Are there any medications that need to be held prior to surgery and how long? [place] TWO DAYS PRIOR  Practice name and name of physician performing surgery?      [facility]/Dr. Hilarie Fredrickson  What is your office phone and fax number?      Phone- [phone]  Fax- [phone]  Attn: Peter Congo, RMA  Anesthesia type (None, local, MAC, general) ?       MAC

## 2019-06-24 NOTE — Progress Notes (Signed)
ASSESSMENT / PLAN:   36. 68 yo male with CAD / remote CABG / chronic systolic heart failure, ischemic cardiomyopathy, EF 15-20%, ICD placement. On Xarelto.   2. Hx of upper GI bleed on anticoagulant in February [facility]). Non-bleeding gastric ulcer on EGD. [facility] has referred patient to [place] for follow up EGD ( document ulcer healing) and colonoscopy for colon cancer screening. He did have anemia of acute blood loss but hgb has recovered, it was 13 late April.  -Patient is at very high risk for procedures given severe cardiac disease. Will proceed with EGD / colonoscopy to be done at [facility]. The risks and benefits of EGD and colonoscopy with possible polypectomy / biopsies were discussed and the patient agrees to proceed.  He should be off Xarelto for procedures unless risks is too great.  - Patient requesting that we examine distal small bowel on colonoscopy (something he heard about on a TV commercial). I tried to explain the reasons this is not routinely done during screening colonoscopy but he plans to speak with Dr. Hilarie Fredrickson about it just prior to procedure.  -Patient has not been taking a PPI ( probably for months).    3. Chronic anticoagulation. On Xarelto. -Hold Xarelto for 2 days before procedure - will instruct when and how to resume after procedure. Patient understands that there is a low but real risk of cardiovascular event such as heart attack, stroke, or embolism /  thrombosis while off blood thinner. The patient consents to proceed. Will communicate by phone or EMR with patient's prescribing provider to confirm that holding Xarelto is reasonable in this case.    HPI:    Referring Provider:   Barney Drain, MD    Reason for referral:   EGD and colonoscopy for anemia   Chief Complaint:   Follow up on GI bleed and anemia.   Patient is a 68 year old male with CAD/CABG 2542,  chronic systolic heart failure, ischemic cardiomyopathy, ejection fraction  15-20% by echo 03/17/19. He has an ICD.  Patient was hospitalized at [facility] in February of this year with an upper GI bleed on Plavix.  Nonbleeding gastric ulcer and erosive gastropathy found on inpatient EGD by Dr. Gala Romney. Path c/w reactive gastropathy.  During that admission his hemoglobin declined to the 7 range, his baseline hgb is hard to ascertain.  By the time of discharge,  hemoglobin was up to 10.6. At last check late April of this year hgb was 13.  Iron studies in February revealed TIBC 446, iron saturation was low at 7.  Ferritin 36  Patient is in need of follow-up EGD to document ulcer healing.  He has never had a colonoscopy and the plan was to do that at the time of EGD.  Patient felt by [facility] to be high risk for procedures to be done at [facility].   Patient has no GI complaints. No overt bleeding nor abdominal pain. He discontinued iron supplements a few months back. He is not taking NSAIDs.    Data Reviewed:  06/09/2019 Potassium 3.1, BUN 26, creatinine 1.23.   EGD February 2020 Dr. Gala Romney Small hiatal hernia Non-bleeding gastric ulcer with no stigmata of bleeding. - Erosive gastropathy. Status post gastric biopsy. - Normal duodenal bulb and second portion of the duodenum. Gastric findings suspicious for occult NSAID use  Gastric biopsies compatible with reactive gastropathy.   Past  Medical History:  Diagnosis Date  . AICD (automatic cardioverter/defibrillator) present 05/18/2016  . Anginal pain ([place])    occ none recent  . Aortic stenosis   . Arthritis   . CAD (coronary artery disease)   . Cardiomyopathy, ischemic   . CHF (congestive heart failure) ([place])   . Chronic back pain   . Diabetes mellitus without complication ([place])   . Elevated PSA   . History of kidney stones   . Hyperlipidemia   . Morbid obesity ([place])   . Myocardial infarction ([place])   . OSA (obstructive sleep apnea)    does not wear CPAP, -order run out told need to redo  test  .  Paroxysmal atrial fibrillation ([place]) 09/29/2018   This was found on event monitor October 2019  . 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 APPENDECTOMY    . BIOPSY  10/02/2018   Procedure: BIOPSY;  Surgeon: Daneil Dolin, MD;  [facility];  Service: Endoscopy;;  gastric  . CARDIAC CATHETERIZATION     stents  . CAROTID STENT     denies carotid stent  . CATARACT EXTRACTION, BILATERAL    . CORONARY ARTERY BYPASS GRAFT  12/23/1999   LIMA to LAD, SVG to ramus, sequential SVG to OM2-RPL, sequential SVG to AM-PDA  . EP IMPLANTABLE DEVICE N/A 05/18/2016   Procedure: ICD Implant;  Surgeon: Will Meredith Leeds, MD;  Location: [place] CV LAB;  Service: Cardiovascular;  Laterality: N/A;  . ESOPHAGOGASTRODUODENOSCOPY (EGD) WITH PROPOFOL N/A 10/02/2018   Procedure: ESOPHAGOGASTRODUODENOSCOPY (EGD) WITH PROPOFOL;  Surgeon: Daneil Dolin, MD;  [facility];  Service: Endoscopy;  Laterality: N/A;  . EYE SURGERY    . ingrown toenail N/A   . LEFT HEART CATHETERIZATION WITH CORONARY/GRAFT ANGIOGRAM N/A 12/21/2014   Procedure: LEFT HEART CATHETERIZATION WITH Beatrix Fetters;  Surgeon: Belva Crome, MD;  Location: [facility] CATH LAB;  Service: Cardiovascular;  Laterality: N/A;  . NASAL SEPTOPLASTY W/ TURBINOPLASTY Bilateral 04/25/2017   NASAL SEPTOPLASTY WITH TURBINATE REDUCTION/notes 04/25/2017  . NASAL SEPTOPLASTY W/ TURBINOPLASTY Bilateral 04/25/2017   Procedure: NASAL SEPTOPLASTY WITH TURBINATE REDUCTION;  Surgeon: Leta Baptist, MD;  Location: [place];  Service: ENT;  Laterality: Bilateral;  . RIGHT/LEFT HEART CATH AND CORONARY/GRAFT ANGIOGRAPHY N/A 10/05/2017   Procedure: RIGHT/LEFT HEART CATH AND  CORONARY/GRAFT ANGIOGRAPHY;  Surgeon: Sherren Mocha, MD;  Location: [place] CV LAB;  Service: Cardiovascular;  Laterality: N/A;  . TEE WITHOUT CARDIOVERSION N/A 11/20/2017   Procedure: TRANSESOPHAGEAL ECHOCARDIOGRAM (TEE);  Surgeon: Sherren Mocha, MD;  Location: [place];  Service: Open Heart Surgery;  Laterality: N/A;  . TONSILLECTOMY    . TRANSCATHETER AORTIC VALVE REPLACEMENT, TRANSFEMORAL N/A 11/20/2017   Procedure: TRANSCATHETER AORTIC VALVE REPLACEMENT, TRANSFEMORAL;  Surgeon: Sherren Mocha, MD;  Location: [place];  Service: Open Heart Surgery;  Laterality: N/A;   Family History  Adopted: Yes  Problem Relation Age of Onset  . Diabetes Mother   . CAD Mother   . Colon cancer Neg Hx   . Colon polyps Neg Hx    Social History   Tobacco Use  . Smoking status: Former Smoker    Quit date: 08/29/1999    Years since  quitting: 19.8  . Smokeless tobacco: Never Used  . Tobacco comment: couldn't remember date  Substance Use Topics  . Alcohol use: No    Alcohol/week: 0.0 standard drinks  . Drug use: No   Current Outpatient Medications  Medication Sig Dispense Refill  . acetaminophen (TYLENOL) 500 MG tablet Take 1 tablet (500 mg total) by mouth 2 (two) times daily. (Patient taking differently: Take 500 mg by mouth every 6 (six) hours as needed for moderate pain. ) 14 tablet 0  . calcium carbonate (TUMS - DOSED IN MG ELEMENTAL CALCIUM) 500 MG chewable tablet Chew 1 tablet by mouth 2 (two) times daily as needed for indigestion or heartburn.    . carvedilol (COREG) 3.125 MG tablet TAKE (1) TABLET BY MOUTH TWICE DAILY WITH A MEAL. 60 tablet 2  . ferrous sulfate 325 (65 FE) MG tablet Take 325 mg by mouth daily with breakfast.    . HYDROcodone-acetaminophen (NORCO) 10-325 MG tablet Take one tablet po twice daily prn pain 60 tablet 0  . insulin aspart (NOVOLOG FLEXPEN) 100 UNIT/ML FlexPen Inject 60 Units into the skin 2 (two) times daily with a meal. 15 mL 5  . LEVEMIR FLEXTOUCH 100 UNIT/ML Pen  INJECT 60 UNITS INTO THE SKIN AT BEDTIME. 15 mL 0  . linaclotide (LINZESS) 290 MCG CAPS capsule Take 1 capsule (290 mcg total) by mouth daily before breakfast. 30 capsule 5  . liver oil-zinc oxide (DESITIN) 40 % ointment Apply 1 application topically as needed for irritation.    . metolazone (ZAROXOLYN) 2.5 MG tablet TAKE ONE TABLET BY MOUTH DAILY. (Patient taking differently: Take 2.5 mg by mouth every other day. ) 30 tablet 3  . Multiple Vitamins-Minerals (CENTRUM SILVER PO) Take 1 tablet by mouth daily.     . nitroGLYCERIN (NITROSTAT) 0.4 MG SL tablet PLACE 1 TABLET UNDER TONGUE FOR CHEST PAIN. MAY REPEAT EVERY 5 MIN UP TO 3 DOSES-NO RELIEF,CALL 911. 25 tablet 2  . pantoprazole (PROTONIX) 40 MG tablet Take 1 tablet (40 mg total) by mouth 2 (two) times daily. 60 tablet 3  . polycarbophil (FIBERCON) 625 MG tablet Take 2 tablets (1,250 mg total) by mouth daily with lunch. Hold if you have diarrhea (Patient taking differently: Take 3,125 mg by mouth 2 (two) times a day. 3 tabs in the morning, 3 at noon and 2 in evening) 30 tablet 0  . Polyethyl Glycol-Propyl Glycol (SYSTANE) 0.4-0.3 % GEL ophthalmic gel Place 1 application into both eyes daily.     . potassium chloride SA (KLOR-CON M20) 20 MEQ tablet Take 3 qam, 1 at lunch, and 3 at supper 630 tablet 0  . rivaroxaban (XARELTO) 20 MG TABS tablet Take 1 tablet (20 mg total) by mouth daily with supper. 30 tablet 6  . rosuvastatin (CRESTOR) 40 MG tablet Take 1 tablet (40 mg total) by mouth daily. 90 tablet 1  . spironolactone (ALDACTONE) 25 MG tablet TAKE 1 TABLET BY MOUTH ONCE A DAY. 90 tablet 2  . SURE COMFORT PEN NEEDLES 31G X 8 MM MISC USING 4 TIMES DAILY. 100 each 0  . torsemide (DEMADEX) 20 MG tablet TAKE 60 MG IN THE MORNING & TAKE 40 MG IN THE EVENING. 450 tablet 3  . traMADol (ULTRAM) 50 MG tablet TAKE 1 TABLET BY MOUTH EVERY 6 TO 8 HOURS AS NEEDED FOR HEADACHE. 20 tablet 0  . trolamine salicylate (ASPERCREME) 10 % cream Apply 1 application  topically 2 (two) times daily as needed for muscle pain.  No current facility-administered medications for this visit.    Allergies  Allergen Reactions  . Dyflex-G [Dyphylline-Guaifenesin] Hives  . Eliquis [Apixaban] Other (See Comments)    Side effects not a true allergy- he stated that it made him feel bad     Review of Systems: All systems reviewed and negative except where noted in HPI.   Serum creatinine: 1.23 mg/dL 06/09/19 1356 Estimated creatinine clearance: 78.4 mL/min   Physical Exam:    Wt Readings from Last 3 Encounters:  06/24/19 286 lb 3 oz (129.8 kg)  06/13/19 283 lb (128.4 kg)  05/23/19 291 lb (132 kg)    BP 122/82 (BP Location: Left Arm, Patient Position: Sitting, Cuff Size: Normal)   Pulse 88   Temp (!) 97.4 F (36.3 C) (Oral)   Ht 5' 10.5" (1.791 m)   Wt 286 lb 3 oz (129.8 kg)   BMI 40.48 kg/m  Constitutional:  Male,  in no acute distress. Psychiatric: Normal mood and affect. Behavior is normal. EENT: Pupils normal.  Conjunctivae are normal. No scleral icterus. Neck supple.  Cardiovascular: Normal rate, regular rhythm. No edema Pulmonary/chest: Effort normal and breath sounds normal. No wheezing, rales or rhonchi. Abdominal: Soft, protuberant, nontender. Bowel sounds active throughout.  Neurological: Alert and oriented to person place and time. Skin: Skin is warm and dry. No rashes noted.  Tye Savoy, NP  06/24/2019, 3:02 PM   Wolfgang Phoenix, Elayne Snare, MD

## 2019-06-24 NOTE — Patient Instructions (Addendum)
If you are age 68 or older, your body mass index should be between 23-30. Your Body mass index is 40.48 kg/m. If this is out of the aforementioned range listed, please consider follow up with your Primary Care Provider.  If you are age 36 or younger, your body mass index should be between 19-25. Your Body mass index is 40.48 kg/m. If this is out of the aformentioned range listed, please consider follow up with your Primary Care Provider.   You have been scheduled for an endoscopy and colonoscopy. Please follow the written instructions given to you at your visit today. Please pick up your prep supplies at the pharmacy within the next 1-3 days. If you use inhalers (even only as needed), please bring them with you on the day of your procedure. Your physician has requested that you go to www.startemmi.com and enter the access code given to you at your visit today. This web site gives a general overview about your procedure. However, you should still follow specific instructions given to you by our office regarding your preparation for the procedure.  We have sent the following medications to your pharmacy for you to pick up at your convenience: [place] will be contacted by our office prior to your procedure for directions on holding your Xarelto.  If you do not hear from our office 1 week prior to your scheduled procedure, please call [phone] to discuss.  Due to recent COVID-19 restrictions implemented by our local and state authorities and in an effort to keep both patients and staff as safe as possible, our hospital system now requires COVID-19 testing prior to any scheduled hospital procedure. Please go to our [facility] location drive thru testing site ([address], [place], [place] [address]) on 07/26/19 at  9:30 am. There will be multiple testing areas, the first checkpoint being for pre-procedure/surgery testing. Get into the right (yellow) lane that leads to the PAT testing  team. You will not be billed at the time of testing but may receive a bill later depending on your insurance. The approximate cost of the test is $100. You must agree to quarantine from the time of your testing until the procedure date on 07/31/19 . This should include staying at home with ONLY the people you live with. Avoid take-out, grocery store shopping or leaving the house for any non-emergent reason. Failure to have your COVID-19 test done on the date and time you have been scheduled will result in cancellation of procedure. Please call our office at [phone] if you have any questions.    Thank you for choosing me and [facility].   Tye Savoy, NP

## 2019-06-25 ENCOUNTER — Encounter: Payer: Self-pay | Admitting: Nurse Practitioner

## 2019-06-25 NOTE — Telephone Encounter (Signed)
Has virtual appt on 11/2

## 2019-06-26 ENCOUNTER — Encounter: Payer: Self-pay | Admitting: Cardiology

## 2019-06-26 ENCOUNTER — Ambulatory Visit (INDEPENDENT_AMBULATORY_CARE_PROVIDER_SITE_OTHER): Payer: Medicare Other | Admitting: Cardiology

## 2019-06-26 ENCOUNTER — Other Ambulatory Visit: Payer: Self-pay

## 2019-06-26 VITALS — BP 130/84 | HR 81 | Temp 97.1°F | Ht 70.5 in | Wt 285.0 lb

## 2019-06-26 DIAGNOSIS — I255 Ischemic cardiomyopathy: Secondary | ICD-10-CM | POA: Diagnosis not present

## 2019-06-26 DIAGNOSIS — I5023 Acute on chronic systolic (congestive) heart failure: Secondary | ICD-10-CM | POA: Diagnosis not present

## 2019-06-26 MED ORDER — TORSEMIDE 20 MG PO TABS: ORAL_TABLET | ORAL | 3 refills | Status: DC

## 2019-06-26 NOTE — Patient Instructions (Signed)
Medication Instructions:  INCREASE TORSEMIDE TO 80 MG IN THE MORNING (4 TABLETS) & 60 MG IN THE AFTERNOON (3 TABLETS)   Labwork: NONE  Testing/Procedures: NONE  Follow-Up: Your physician recommends that you schedule a follow-up appointment in: 3 MONTHS    Any Other Special Instructions Will Be Listed Below (If Applicable).  PLEASE CALL IN 1 WEEK WITH AN UPDATE ON WEIGHTS    If you need a refill on your cardiac medications before your next appointment, please call your pharmacy.

## 2019-06-26 NOTE — Telephone Encounter (Signed)
Ok to hold xarelto as needed for procedure   J Matai Carpenito MD

## 2019-06-26 NOTE — Progress Notes (Signed)
Clinical Summary [name] is a 68 y.o.male  seen today for follow up of the following medical problems. This is a focused visit on recent issues with acute on chronic systolic HF, for more detailed history please refer to prior clinic note.   1. Chronic systolic HF - history of ICM, LVEF 10-15% by echo Jan 2018. Restricitive diastolic dysfunction.  - ICD followed by EP - admit Jan 2018 with acute on chronic CHF. Diuresed nearly 15 lbs. Discharge weight 297 lbs. From pcp note 10/13/16 down to 279 lbs.     - 02/2019 echo :LVEF 15-20%, mod RV dysfunction, normal AVR - being considered for possible CRT upgrade by EP.   - recent SOB, worst over the last 3 days. DOE with mild to moderate activities - no recent chest pain. +sinus congestion. Has been on intermittent abx with some improvement. No cough, no wheezing.  - has 20 lbs weight gain by our scale. Home scale 281 lbs.  - recent leg edema. Has missed some torsemide doses - has also missed some other meds. Takes torsemide    - last visit had signs of fluid overload, had some poor compliance with his diuretics.  - weight got down as low as 263 at home, trending back up to 278. LE edema is reoccuring. Taking torsemide 60mg  in AM and 40mg  in PM, taking metolazone 2.5mg  on Friday.       Past Medical History:  Diagnosis Date  . AICD (automatic cardioverter/defibrillator) present 05/18/2016  . Anginal pain ([place])    occ none recent  . Aortic stenosis   . Arthritis   . CAD (coronary artery disease)   . Cardiomyopathy, ischemic   . CHF (congestive heart failure) ([place])   . Chronic back pain   . Diabetes mellitus without complication ([place])   . Elevated PSA   . History of kidney stones   . Hyperlipidemia   . Morbid obesity ([place])   . Myocardial infarction ([place])   . OSA (obstructive sleep apnea)    does not wear CPAP, -order run out told need to redo  test  . Paroxysmal atrial fibrillation ([place]) 09/29/2018   This was  found on event monitor October 2019  . 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 Dyflex-G [Dyphylline-Guaifenesin] Hives  . Eliquis [Apixaban] Other (See Comments)    Side effects not a true allergy- he stated that it made him feel bad     Current Outpatient Medications  Medication Sig Dispense Refill  . acetaminophen (TYLENOL) 500 MG tablet Take 1 tablet (500 mg total) by mouth 2 (two) times daily. (Patient taking differently: Take 500 mg by mouth every 6 (six) hours as needed for moderate pain. ) 14 tablet 0  . calcium carbonate (TUMS - DOSED IN MG ELEMENTAL CALCIUM) 500 MG chewable tablet Chew 1 tablet by mouth 2 (two) times daily as needed for indigestion or heartburn.    . carvedilol (COREG) 3.125 MG tablet TAKE (1) TABLET BY MOUTH TWICE DAILY WITH A MEAL. 60 tablet 2  . HYDROcodone-acetaminophen (NORCO) 10-325 MG tablet Take one tablet po twice daily prn pain 60 tablet 0  . insulin aspart (NOVOLOG FLEXPEN) 100 UNIT/ML FlexPen Inject 60  Units into the skin 2 (two) times daily with a meal. 15 mL 5  . LEVEMIR FLEXTOUCH 100 UNIT/ML Pen INJECT 60 UNITS INTO THE SKIN AT BEDTIME. 15 mL 0  . linaclotide (LINZESS) 290 MCG CAPS capsule Take 1 capsule (290 mcg total) by mouth daily before breakfast. 30 capsule 5  . liver oil-zinc oxide (DESITIN) 40 % ointment Apply 1 application topically as needed for irritation.    . metolazone (ZAROXOLYN) 2.5 MG tablet TAKE ONE TABLET BY MOUTH DAILY. (Patient taking differently: Take 2.5 mg by mouth every other day. ) 30 tablet 3  . Multiple Vitamins-Minerals (CENTRUM SILVER PO) Take 1 tablet by mouth daily.     . Na Sulfate-K Sulfate-Mg Sulf 17.5-3.13-1.6 GM/177ML SOLN Suprep-Use as directed  354 mL 0  . nitroGLYCERIN (NITROSTAT) 0.4 MG SL tablet PLACE 1 TABLET UNDER TONGUE FOR CHEST PAIN. MAY REPEAT EVERY 5 MIN UP TO 3 DOSES-NO RELIEF,CALL 911. 25 tablet 2  . pantoprazole (PROTONIX) 40 MG tablet Take 1 tablet (40 mg total) by mouth 2 (two) times daily. 60 tablet 3  . polycarbophil (FIBERCON) 625 MG tablet Take 2 tablets (1,250 mg total) by mouth daily with lunch. Hold if you have diarrhea (Patient taking differently: Take 3,125 mg by mouth 2 (two) times a day. 3 tabs in the morning, 3 at noon and 2 in evening) 30 tablet 0  . Polyethyl Glycol-Propyl Glycol (SYSTANE) 0.4-0.3 % GEL ophthalmic gel Place 1 application into both eyes daily.     . potassium chloride SA (KLOR-CON M20) 20 MEQ tablet Take 3 qam, 1 at lunch, and 3 at supper 630 tablet 0  . rivaroxaban (XARELTO) 20 MG TABS tablet Take 1 tablet (20 mg total) by mouth daily with supper. 30 tablet 6  . rosuvastatin (CRESTOR) 40 MG tablet Take 1 tablet (40 mg total) by mouth daily. 90 tablet 1  . spironolactone (ALDACTONE) 25 MG tablet TAKE 1 TABLET BY MOUTH ONCE A DAY. 90 tablet 2  . SURE COMFORT PEN NEEDLES 31G X 8 MM MISC USING 4 TIMES DAILY. 100 each 0  . torsemide (DEMADEX) 20 MG tablet TAKE 60 MG IN THE MORNING & TAKE 40 MG IN THE EVENING. 450 tablet 3  . traMADol (ULTRAM) 50 MG tablet TAKE 1 TABLET BY MOUTH EVERY 6 TO 8 HOURS AS NEEDED FOR HEADACHE. 20 tablet 0  . trolamine salicylate (ASPERCREME) 10 % cream Apply 1 application topically 2 (two) times daily as needed for muscle pain.      No current facility-administered medications for this visit.      Past Surgical History:  Procedure Laterality Date  . APPENDECTOMY    . BIOPSY  10/02/2018   Procedure: BIOPSY;  Surgeon: [name] Dolin, MD;  [facility]: [facility];  Service: Endoscopy;;  gastric  . CARDIAC CATHETERIZATION     stents  . CAROTID STENT     denies carotid stent  . CATARACT EXTRACTION, BILATERAL    . CORONARY ARTERY BYPASS GRAFT  12/23/1999   LIMA to  LAD, SVG to ramus, sequential SVG to OM2-RPL, sequential SVG to AM-PDA  . EP IMPLANTABLE DEVICE N/A 05/18/2016   Procedure: ICD Implant;  Surgeon: Will Meredith Leeds, MD;  Location: [facility];  Service: Cardiovascular;  Laterality: N/A;  . ESOPHAGOGASTRODUODENOSCOPY (EGD) WITH PROPOFOL N/A 10/02/2018   Procedure: ESOPHAGOGASTRODUODENOSCOPY (EGD) WITH PROPOFOL;  Surgeon: [name] Dolin, MD;  [facility]: [facility];  Service: Endoscopy;  Laterality: N/A;  . EYE SURGERY    .  ingrown toenail N/A   . LEFT HEART CATHETERIZATION WITH CORONARY/GRAFT ANGIOGRAM N/A 12/21/2014   Procedure: LEFT HEART CATHETERIZATION WITH Beatrix Fetters;  Surgeon: Belva Crome, MD;  Location: [facility];  Service: Cardiovascular;  Laterality: N/A;  . NASAL SEPTOPLASTY W/ TURBINOPLASTY Bilateral 04/25/2017   NASAL SEPTOPLASTY WITH TURBINATE REDUCTION/notes 04/25/2017  . NASAL SEPTOPLASTY W/ TURBINOPLASTY Bilateral 04/25/2017   Procedure: NASAL SEPTOPLASTY WITH TURBINATE REDUCTION;  Surgeon: Leta Baptist, MD;  Location: [place];  Service: ENT;  Laterality: Bilateral;  . RIGHT/LEFT HEART CATH AND CORONARY/GRAFT ANGIOGRAPHY N/A 10/05/2017   Procedure: RIGHT/LEFT HEART CATH AND CORONARY/GRAFT ANGIOGRAPHY;  Surgeon: Sherren Mocha, MD;  Location: [place] [facility];  Service: Cardiovascular;  Laterality: N/A;  . TEE WITHOUT CARDIOVERSION N/A 11/20/2017   Procedure: TRANSESOPHAGEAL ECHOCARDIOGRAM (TEE);  Surgeon: Sherren Mocha, MD;  Location: [place] [facility];  Service: Open Heart Surgery;  Laterality: N/A;  . TONSILLECTOMY    . TRANSCATHETER AORTIC VALVE REPLACEMENT, TRANSFEMORAL N/A 11/20/2017   Procedure: TRANSCATHETER AORTIC VALVE REPLACEMENT, TRANSFEMORAL;  Surgeon: Sherren Mocha, MD;  Location: [place];  Service: Open Heart Surgery;  Laterality: N/A;     Allergies  Allergen Reactions  . Dyflex-G [Dyphylline-Guaifenesin] Hives  . Eliquis [Apixaban] Other (See Comments)    Side effects not a true allergy- he stated that  it made him feel bad      Family History  Adopted: Yes  Problem Relation Age of Onset  . Diabetes Mother   . CAD Mother   . Colon cancer Neg Hx   . Colon polyps Neg Hx      Social History Mr. Ellery reports that he quit smoking about 19 years ago. He has never used smokeless tobacco. Mr. Keirsey reports no history of alcohol use.   Review of Systems CONSTITUTIONAL: No weight loss, fever, chills, weakness or fatigue.  HEENT: Eyes: No visual loss, blurred vision, double vision or yellow sclerae.No hearing loss, sneezing, congestion, runny nose or sore throat.  SKIN: No rash or itching.  CARDIOVASCULAR: no chest pain, no palpitations.  RESPIRATORY: No shortness of breath, cough or sputum.  GASTROINTESTINAL: No anorexia, nausea, vomiting or diarrhea. No abdominal pain or blood.  GENITOURINARY: No burning on urination, no polyuria NEUROLOGICAL: No headache, dizziness, syncope, paralysis, ataxia, numbness or tingling in the extremities. No change in bowel or bladder control.  MUSCULOSKELETAL: No muscle, back pain, joint pain or stiffness.  LYMPHATICS: No enlarged nodes. No history of splenectomy.  PSYCHIATRIC: No history of depression or anxiety.  ENDOCRINOLOGIC: No reports of sweating, cold or heat intolerance. No polyuria or polydipsia.  Marland Kitchen   Physical Examination Today's Vitals   06/26/19 0821  BP: 130/84  Pulse: 81  Temp: (!) 97.1 F (36.2 C)  SpO2: 91%  Weight: 285 lb (129.3 kg)  Height: 5' 10.5" (1.791 m)   Body mass index is 40.32 kg/m.  Gen: resting comfortably, no acute distress HEENT: no scleral icterus, pupils equal round and reactive, no palptable cervical adenopathy,  CV: RRR, no m/r/g, no jvd Resp: Clear to auscultation bilaterally GI: abdomen is soft, non-tender, non-distended, normal bowel sounds, no hepatosplenomegaly MSK: extremities are warm, 2+ bilateral LE edema Skin: warm, no rash Neuro:  no focal deficits Psych: appropriate affect    Diagnostic Studies Jan 2018 echo Study Conclusions  - Left ventricle: The cavity size was normal. Wall thickness was normal. The estimated ejection fraction was in the range of 10% to 15%. Diffuse hypokinesis. Doppler parameters are consistent with restrictive physiology, indicative of decreased left  ventricular diastolic compliance and/or increased left atrial pressure. Doppler parameters are consistent with high ventricular filling pressure. - Aortic valve: Moderately calcified annulus. Moderately thickened leaflets. There was moderate stenosis. AV gradients decreased in the setting of severe LV systolic dysfunction. AVA VTI and dimensionless index support moderate aortic stenosis. Mean gradient (S): 17 mm Hg. Valve area (VTI): 1.2 cm^2. Valve area (Vmax): 1.1 cm^2. - Left atrium: The atrium was severely dilated. - Pulmonary arteries: Systolic pressure was moderately increased. PA peak pressure: 58 mm Hg (S). - Inferior vena cava: The vessel was dilated. The respirophasic diameter changes were blunted (<50%), consistent with elevated central venous pressure. - Technically difficult study. Echocontrast was used to enhance visualization.   09/2017 cath Severe three-vessel coronary artery disease with total occlusion of the RCA, total occlusion of the left circumflex, and total occlusion of the LAD just after the first diagonal 2. Status post aortocoronary bypass surgery with patent grafts including LIMA to LAD, saphenous vein graft to ramus intermedius, saphenous vein graft to second obtuse marginal, and saphenous vein graft to right PDA 3. Calcified, restricted aortic valve under plain fluoroscopy, with mean transvalvular gradient 27 mmHg  Recommendation continued evaluation for progressive aortic stenosis suspicious for low flow low gradient aortic stenosis, CT angiography of the heart and peripheral vessels will be ordered and multidisciplinary  evaluation by cardiac surgery.     Assessment and Plan  1. Acute on chronic systolic HF -remains fluid overloaded, initially weights came down at home but trending back up with increasing edema - increase torsemide to 80mg  in AM and 60mg  PM, continue metolazone 2.5mg  on Fridays. Goal weight would be around 263-265 lbs.  - he will call in 1 week and update [place] on his weights.    F/u 3 months      Arnoldo Lenis, M.D

## 2019-06-26 NOTE — Progress Notes (Signed)
Addendum: Reviewed and agree with assessment and management plan. Faust Thorington M, MD  

## 2019-06-27 ENCOUNTER — Telehealth: Payer: Self-pay | Admitting: Nurse Practitioner

## 2019-06-28 NOTE — Progress Notes (Signed)
  Subjective:  Patient ID: [name] [name], male    DOB: 08/08/1951,  MRN: [id number]  Chief Complaint  Patient presents with  . Nail Problem    Pt states Right 1st nail medial border is healing well, no concerns, no more drainage.   68 y.o. male returns for the above complaint.   Objective:   General AA&O x3. Normal mood and affect.  Vascular Foot warm and well perfused with good capillary refill.  Neurologic Sensation grossly intact.  Dermatologic  right great toenail healing well no warmth no erythema no signs of acute infection  Orthopedic: No tenderness to palpation of the toe.   Assessment & Plan:  Patient was evaluated and treated and all questions answered.  S/p Ingrown Toenail Excision, right -Healing well without issue. -Discussed return precautions. -F/u PRN

## 2019-06-28 NOTE — Progress Notes (Signed)
Cardiology Office Note Date:  06/30/2019  Patient ID:  Maddix, Heinz October 03, 1950, MRN [id number] PCP:  Kathyrn Drown, MD  Cardiologist:  Dr. Harl Bowie Electrophysiologist: Dr. Curt Bears     Chief Complaint: ICD check  History of Present Illness: UCHENNA RAPPAPORT is a 68 y.o. male with history of DM, HTN, HLD, CAD (CABG x6, 2001), VHD s/p TAVR 2019 >> LBBB, Paroxysmal Afib, obesity, ICM, chronic CHF (systolic), ICD.  He comes in today to be seen for Dr. Curt Bears, remotes noting that R waves trending down, Dr. Curt Bears recs checking R waves using integrated bipolar. He last saw Dr. Curt Bears via tele-health visit in May.  He was doing well without complaints other then fatigue.  With new LBBB post TAVR there was mention of discussing perhaps upgrade to CRT device at his next in-clinic visit.  xarelto was increased to appropriate dose.  He most recently saw Dr. Harl Bowie, 06/26/2019, he was volume OL, his torsemide increased.  Mentioned at least historically diuretic noncompliance.. Goal weight 027-253  He is accompanied by his wife today.  He reports doing "fine", denies any CP, palpitations.  No dizzy spells, near syncope or syncope, no shocks.  He remains edematous, by our scale he has lost 3lbs, he mentions wearing steel toed boots today, though hard to get clearly if he had these on when he saw Dr. Harl Bowie last week).  When asked if he has noted an increase in urine OP he says yes, when asked about weight loss by his home scale this is again unclear and says it depends, but said he has been doing this long enough that he doesn't expect to see any big change so fast.   It seems he is making slow progress with diuresis, and feels it is too early to make any judgements or medicine changes   Device information MDT single chamber ICD, implanted, 05/18/16 + appropriate therapy in 2018 VT (VT > ATP >> accelerated > VF> shock)  Past Medical History:  Diagnosis Date  . AICD (automatic  cardioverter/defibrillator) present 05/18/2016  . Anginal pain ([place])    occ none recent  . Aortic stenosis   . Arthritis   . CAD (coronary artery disease)   . Cardiomyopathy, ischemic   . CHF (congestive heart failure) ([place])   . Chronic back pain   . Diabetes mellitus without complication ([place])   . Elevated PSA   . History of kidney stones   . Hyperlipidemia   . Morbid obesity ([place])   . Myocardial infarction ([place])   . OSA (obstructive sleep apnea)    does not wear CPAP, -order run out told need to redo  test  . Paroxysmal atrial fibrillation ([place]) 09/29/2018   This was found on event monitor October 2019  . 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 APPENDECTOMY    . BIOPSY  10/02/2018   Procedure: BIOPSY;  Surgeon: Daneil Dolin, MD;  [facility];  Service: Endoscopy;;  gastric  . CARDIAC CATHETERIZATION     stents  . CAROTID STENT     denies carotid stent  . CATARACT EXTRACTION, BILATERAL    . CORONARY ARTERY BYPASS GRAFT  12/23/1999  LIMA to LAD, SVG to ramus, sequential SVG to OM2-RPL, sequential SVG to AM-PDA  . EP IMPLANTABLE DEVICE N/A 05/18/2016   Procedure: ICD Implant;  Surgeon: Will Meredith Leeds, MD;  Location: [facility];  Service: Cardiovascular;  Laterality: N/A;  . ESOPHAGOGASTRODUODENOSCOPY (EGD) WITH PROPOFOL N/A 10/02/2018   Procedure: ESOPHAGOGASTRODUODENOSCOPY (EGD) WITH PROPOFOL;  Surgeon: Daneil Dolin, MD;  [facility];  Service: Endoscopy;  Laterality: N/A;  . EYE SURGERY    . ingrown toenail N/A   . LEFT HEART CATHETERIZATION WITH CORONARY/GRAFT ANGIOGRAM N/A 12/21/2014   Procedure: LEFT HEART CATHETERIZATION WITH Beatrix Fetters;  Surgeon: Belva Crome, MD;  Location: [facility] CATH LAB;  Service: Cardiovascular;  Laterality: N/A;  . NASAL SEPTOPLASTY W/ TURBINOPLASTY Bilateral 04/25/2017   NASAL SEPTOPLASTY WITH TURBINATE REDUCTION/notes 04/25/2017  . NASAL SEPTOPLASTY W/ TURBINOPLASTY Bilateral 04/25/2017   Procedure: NASAL SEPTOPLASTY WITH TURBINATE REDUCTION;  Surgeon: Leta Baptist, MD;  Location: [facility];  Service: ENT;  Laterality: Bilateral;  . RIGHT/LEFT HEART CATH AND CORONARY/GRAFT ANGIOGRAPHY N/A 10/05/2017   Procedure: RIGHT/LEFT HEART CATH AND CORONARY/GRAFT ANGIOGRAPHY;  Surgeon: Sherren Mocha, MD;  Location: [facility];  Service: Cardiovascular;  Laterality: N/A;  . TEE WITHOUT CARDIOVERSION N/A 11/20/2017   Procedure: TRANSESOPHAGEAL ECHOCARDIOGRAM (TEE);  Surgeon: Sherren Mocha, MD;  Location: [facility];  Service: Open Heart Surgery;  Laterality: N/A;  . TONSILLECTOMY    . TRANSCATHETER AORTIC VALVE REPLACEMENT, TRANSFEMORAL N/A 11/20/2017   Procedure: TRANSCATHETER AORTIC VALVE REPLACEMENT, TRANSFEMORAL;  Surgeon: Sherren Mocha, MD;  Location: [facility];  Service: Open Heart Surgery;  Laterality: N/A;    Current Outpatient Medications  Medication Sig Dispense Refill  . acetaminophen (TYLENOL) 500 MG tablet Take 1 tablet (500 mg total) by mouth 2 (two) times daily. (Patient taking differently: Take 500 mg by mouth every 6 (six) hours as needed for moderate pain. ) 14 tablet 0  . blood glucose meter kit and supplies KIT Dispense based on patient and insurance preference. Test  up to four times daily as directed.Dx: E11.9 1 each 0  . calcium carbonate (TUMS - DOSED IN MG ELEMENTAL CALCIUM) 500 MG chewable tablet Chew 1 tablet by mouth 2 (two) times daily as needed for indigestion or heartburn.    . carvedilol (COREG) 3.125 MG tablet TAKE (1) TABLET BY MOUTH TWICE DAILY WITH A MEAL. 60 tablet 2  . HYDROcodone-acetaminophen (NORCO) 10-325 MG tablet Take one tablet po twice daily prn pain 60 tablet 0  .  insulin aspart (NOVOLOG FLEXPEN) 100 UNIT/ML FlexPen Inject 60 Units into the skin 2 (two) times daily with a meal. 15 mL 5  . LEVEMIR FLEXTOUCH 100 UNIT/ML Pen INJECT 60 UNITS INTO THE SKIN AT BEDTIME. 15 mL 0  . linaclotide (LINZESS) 290 MCG CAPS capsule Take 1 capsule (290 mcg total) by mouth daily before breakfast. 30 capsule 5  . liver oil-zinc oxide (DESITIN) 40 % ointment Apply 1 application topically as needed for irritation.    . metolazone (ZAROXOLYN) 2.5 MG tablet TAKE ONE TABLET BY MOUTH DAILY. (Patient taking differently: Take 2.5 mg by mouth every other day. ) 30 tablet 3  . Multiple Vitamins-Minerals (CENTRUM SILVER PO) Take 1 tablet by mouth daily.     . Na Sulfate-K Sulfate-Mg Sulf 17.5-3.13-1.6 GM/177ML SOLN Suprep-Use as directed 354 mL 0  . nitroGLYCERIN (NITROSTAT) 0.4 MG SL tablet PLACE 1 TABLET UNDER TONGUE FOR CHEST PAIN. MAY REPEAT EVERY 5 MIN UP TO 3 DOSES-NO RELIEF,CALL 911. 25 tablet  2  . pantoprazole (PROTONIX) 40 MG tablet Take 1 tablet (40 mg total) by mouth 2 (two) times daily. 60 tablet 3  . polycarbophil (FIBERCON) 625 MG tablet Take 2 tablets (1,250 mg total) by mouth daily with lunch. Hold if you have diarrhea (Patient taking differently: Take 3,125 mg by mouth. 3 tabs in the morning, 3 at noon and 2 in evening) 30 tablet 0  . Polyethyl Glycol-Propyl Glycol (SYSTANE) 0.4-0.3 % GEL ophthalmic gel Place 1 application into both eyes daily.     . potassium chloride SA (KLOR-CON M20) 20 MEQ tablet Take 3 qam, 1 at lunch, and 3 at supper 630 tablet 0  . rivaroxaban (XARELTO) 20 MG TABS tablet Take 1 tablet (20 mg total) by mouth daily with supper. 30 tablet 6  . rosuvastatin (CRESTOR) 40 MG tablet Take 1 tablet (40 mg total) by mouth daily. 90 tablet 1  . spironolactone (ALDACTONE) 25 MG tablet TAKE 1 TABLET BY MOUTH ONCE A DAY. 90 tablet 2  . SURE COMFORT PEN NEEDLES 31G X 8 MM MISC USING 4 TIMES DAILY. 100 each 0  . torsemide (DEMADEX) 20 MG tablet TAKE 80 MG IN THE  MORNING & TAKE 60 MG IN THE EVENING. 630 tablet 3  . traMADol (ULTRAM) 50 MG tablet TAKE 1 TABLET BY MOUTH EVERY 6 TO 8 HOURS AS NEEDED FOR HEADACHE. 20 tablet 0  . trolamine salicylate (ASPERCREME) 10 % cream Apply 1 application topically 2 (two) times daily as needed for muscle pain.      No current facility-administered medications for this visit.     Allergies:   Dyflex-g [dyphylline-guaifenesin] and Eliquis [apixaban]   Social History:  The patient  reports that he quit smoking about 19 years ago. He has never used smokeless tobacco. He reports that he does not drink alcohol or use drugs.   Family History:  The patient's family history includes CAD in his mother; Diabetes in his mother. He was adopted.  ROS:  Please see the history of present illness.  All other systems are reviewed and otherwise negative.   PHYSICAL EXAM:  VS:  BP 122/64   Pulse 71   Ht 5' 10.5" (1.791 m)   Wt 283 lb (128.4 kg)   SpO2 96%   BMI 40.03 kg/m  BMI: Body mass index is 40.03 kg/m. Well nourished, well developed, in no acute distress  HEENT: normocephalic, atraumatic  Neck: no JVD, carotid bruits or masses Cardiac:  RRR; no significant murmurs, no rubs, or gallops Lungs:  CTA b/l, no wheezing, rhonchi or rales  Abd: soft, nontender, obese MS: no deformity or atrophy Ext: 1-2+ edema to just above mid-shin b/l Skin: warm and dry, no rash Neuro:  No gross deficits appreciated Psych: euthymic mood, full affect  ICD site is stable, no tethering or discomfort   EKG:  Not done today 10/03/2018 SR, IVCD 06/19/18, SR, LBBB  ICD interrogation done today and reviewed by myself:  Battery est 9.6 years RV lead threshold is good, bipolar sensing R waves 2.4-3.6 trending down slowly,  Impedance is stable tip-coil R waves 10.1-11.1, isometrics and UE movements, and pocket manipulation are done, device does not sense any far field signals    03/03/2019: TTE IMPRESSIONS 1. The left ventricle has a  visually estimated ejection fraction of 15-20%. The cavity size was moderately dilated. Left ventricular diastolic Doppler parameters are indeterminate.  2. The right ventricle has moderately reduced systolic function. The cavity was moderately enlarged. There is no increase  in right ventricular wall thickness. Right ventricular systolic pressure 25.3 mmHg.  3. Left atrial size was severely dilated.  4. Right atrial size was mildly dilated.  5. There is mild mitral annular calcification present.  6. A 8m Edwards Sapien bioprosthetic aortic valve (TAVR) valve is present in the aortic position. Procedure Date: 11/20/2017. Leaflet excursion appears normal. No prosthetic or paravalvular regurgitation. Mean systolic gradient 8 mmHg.  7. The aortic root and ascending aorta are normal in size and structure.  8. The inferior vena cava was dilated in size with <50% respiratory variability.  9. No intracardiac thrombi or masses were visualized with Definity contrast. 10. When compared to the prior study: 01/04/2018 - no significant change has occured. Aortic valve systolic Doppler gradient better visualized on today's exam.   10/05/17: LHC 1.  Severe three-vessel coronary artery disease with total occlusion of the RCA, total occlusion of the left circumflex, and total occlusion of the LAD just after the first diagonal 2.  Status post aortocoronary bypass surgery with patent grafts including LIMA to LAD, saphenous vein graft to ramus intermedius, saphenous vein graft to second obtuse marginal, and saphenous vein graft to right PDA 3.  Calcified, restricted aortic valve under plain fluoroscopy, with mean transvalvular gradient 27 mmHg  Recommendation continued evaluation for progressive aortic stenosis suspicious for low flow low gradient aortic stenosis, CT angiography of the heart and peripheral vessels will be ordered and multidisciplinary evaluation by cardiac surgery.   Recent Labs: 09/12/2018: BNP  477.4 10/23/2018: Magnesium 2.1 12/20/2018: Hemoglobin 13.0; Platelets 281 03/03/2019: ALT 7 06/09/2019: BUN 26; Creatinine, Ser 1.23; Potassium 3.1; Sodium 139  03/03/2019: Chol/HDL Ratio 4.6; Cholesterol, Total 133; HDL 29; LDL Calculated 73; Triglycerides 155   CrCl cannot be calculated (Patient's most recent lab result is older than the maximum 21 days allowed.).   Wt Readings from Last 3 Encounters:  06/30/19 283 lb (128.4 kg)  06/26/19 285 lb (129.3 kg)  06/24/19 286 lb 3 oz (129.8 kg)     Other studies reviewed: Additional studies/records reviewed today include: summarized above  ASSESSMENT AND PLAN:  1. ICD     As above     Reviewed with Dr. CCurt Bears will keep tip-coil sensing given no far field sensing occurred today  2. ICM 3. Chronic CHF (systolic) 4. Severe AS > TAVR     LBBB, LBBB-like IVCD      He has had some trouble with volume OL He has asked that I check in Friday to assess his daily weights/swelling.  I encourage him to communicate with Dr. BHarl Bowieas well, though will check in Friday with him  Will have him see Dr. CCurt BearsJanuary to visit potential CRT upgrade  5. Paroxysmal Afib     CHA2DS2Vasc is 5, on xarelto appropriately dosed     He denies any bleeding, signs of bleeding  6. HTN     Looks ok, no changes     Disposition: F/u with Dr. CCurt Bears2 mo, Dr. BHarl Bowieas directed by him.    Current medicines are reviewed at length with the patient today.  The patient did not have any concerns regarding medicines.  SVenetia Night PA-C 06/30/2019 4:37 PM     [place] [phone])  [phone])

## 2019-06-30 ENCOUNTER — Other Ambulatory Visit: Payer: Self-pay

## 2019-06-30 ENCOUNTER — Ambulatory Visit (INDEPENDENT_AMBULATORY_CARE_PROVIDER_SITE_OTHER): Payer: Medicare Other | Admitting: Physician Assistant

## 2019-06-30 ENCOUNTER — Telehealth: Payer: Self-pay

## 2019-06-30 ENCOUNTER — Ambulatory Visit (INDEPENDENT_AMBULATORY_CARE_PROVIDER_SITE_OTHER): Payer: Medicare Other | Admitting: Family Medicine

## 2019-06-30 ENCOUNTER — Telehealth: Payer: Self-pay | Admitting: Cardiology

## 2019-06-30 VITALS — BP 122/64 | HR 71 | Ht 70.5 in | Wt 283.0 lb

## 2019-06-30 DIAGNOSIS — Z95810 Presence of automatic (implantable) cardiac defibrillator: Secondary | ICD-10-CM | POA: Diagnosis not present

## 2019-06-30 DIAGNOSIS — M545 Low back pain: Secondary | ICD-10-CM | POA: Diagnosis not present

## 2019-06-30 DIAGNOSIS — G8929 Other chronic pain: Secondary | ICD-10-CM

## 2019-06-30 DIAGNOSIS — R972 Elevated prostate specific antigen [PSA]: Secondary | ICD-10-CM

## 2019-06-30 DIAGNOSIS — E785 Hyperlipidemia, unspecified: Secondary | ICD-10-CM | POA: Diagnosis not present

## 2019-06-30 DIAGNOSIS — I480 Paroxysmal atrial fibrillation: Secondary | ICD-10-CM

## 2019-06-30 DIAGNOSIS — Z952 Presence of prosthetic heart valve: Secondary | ICD-10-CM

## 2019-06-30 DIAGNOSIS — Z23 Encounter for immunization: Secondary | ICD-10-CM | POA: Diagnosis not present

## 2019-06-30 DIAGNOSIS — I5042 Chronic combined systolic (congestive) and diastolic (congestive) heart failure: Secondary | ICD-10-CM | POA: Diagnosis not present

## 2019-06-30 DIAGNOSIS — I255 Ischemic cardiomyopathy: Secondary | ICD-10-CM

## 2019-06-30 DIAGNOSIS — I10 Essential (primary) hypertension: Secondary | ICD-10-CM | POA: Diagnosis not present

## 2019-06-30 DIAGNOSIS — E1121 Type 2 diabetes mellitus with diabetic nephropathy: Secondary | ICD-10-CM

## 2019-06-30 MED ORDER — HYDROCODONE-ACETAMINOPHEN 10-325 MG PO TABS: ORAL_TABLET | ORAL | 0 refills | Status: DC

## 2019-06-30 MED ORDER — BLOOD GLUCOSE MONITOR KIT: PACK | 0 refills | Status: AC

## 2019-06-30 NOTE — Telephone Encounter (Signed)
I gave the pt the number to [facility] support to get additional help.

## 2019-06-30 NOTE — Progress Notes (Signed)
Subjective:    Patient ID: [name] [name], male    DOB: 01/02/1951, 68 y.o.   MRN: [id number]  Diabetes He presents for his follow-up diabetic visit. He has type 2 diabetes mellitus. Pertinent negatives for hypoglycemia include no confusion or headaches. Pertinent negatives for diabetes include no chest pain and no weakness. Risk factors for coronary artery disease include diabetes mellitus, hypertension and dyslipidemia. Current diabetic treatment includes insulin injections. He is compliant with treatment all of the time.   Went on diet and sugar reading all over the place- wonder if machine is wrong or diet is working  This patient was seen today for chronic pain  The medication list was reviewed and updated.   -Compliance with medication: Patient states he does take his pain medicine on a daily basis  - Number patient states they take daily: 1 or 2 every day  -when was the last dose patient took?  Yesterday evening  The patient was advised the importance of maintaining medication and not using illegal substances with these.  Here for refills and follow up  The patient was educated that we can provide 3 monthly scripts for their medication, it is their responsibility to follow the instructions.  Side effects or complications from medications: He denies any side effects with the medicine states it does take the level of his pain down  Patient is aware that pain medications are meant to minimize the severity of the pain to allow their pain levels to improve to allow for better function. They are aware of that pain medications cannot totally remove their pain.  Due for UDT ( at least once per year) : Early next year  Drug registry checked His diabetes he is currently working hard on diet in he states he has not had to take his insulin most days he states he has been losing weight.  States his sugars have been looking better.  He would like to have a new glucometer.  Denies any low  sugar spells.  Patient does have chronic low back pain for which she is on pain medicine please see above  Patient does have history of elevated PSA followed by [facility] has an appointment with them within the coming month  Patient with atrial fibrillation he follows up with cardiology on a regular basis    Virtual Visit via Video Note  I connected with [name] [name] on 06/30/19 at  9:00 AM EST by a video enabled telemedicine application and verified that I am speaking with the correct person using two identifiers.  Location: Patient: home Provider: office   I discussed the limitations of evaluation and management by telemedicine and the availability of in person appointments. The patient expressed understanding and agreed to proceed.  History of Present Illness:    Observations/Objective:   Assessment and Plan:   Follow Up Instructions:    I discussed the assessment and treatment plan with the patient. The patient was provided an opportunity to ask questions and all were answered. The patient agreed with the plan and demonstrated an understanding of the instructions.   The patient was advised to call back or seek an in-person evaluation if the symptoms worsen or if the condition fails to improve as anticipated.  I provided 18 minutes of non-face-to-face time during this encounter.         Review of Systems  Constitutional: Negative for activity change.  HENT: Negative for congestion and rhinorrhea.   Respiratory: Negative for cough and  shortness of breath.   Cardiovascular: Negative for chest pain.  Gastrointestinal: Negative for abdominal pain, diarrhea, nausea and vomiting.  Genitourinary: Negative for dysuria and hematuria.  Musculoskeletal: Positive for arthralgias and back pain.  Neurological: Negative for weakness and headaches.  Psychiatric/Behavioral: Negative for behavioral problems and confusion.       Objective:   Physical Exam  Today's  visit was via telephone Physical exam was not possible for this visit       Assessment & Plan:  Diabetes-subpar control but it is improving currently Now he is watching his diet closely and holding off on insulin and only using it occasionally We will have him record his sugars closely over the next couple weeks send them to [place] via MyChart we will make some adjustments  Hyperlipidemia taking his medicine previous labs look good continue current measures  Hypokalemia patient will do follow-up lab work again in approximately 3 to 4 months we did make an adjustment in the potassium  Atrial fibrillation sees his cardiologist regular basis denies any setbacks  Elevated PSA followed by urology on a regular basis.  Blood pressure reportedly been doing good  Chronic lumbar pain hydrocodone prescribed does not abuse it drug registry checked 3 prescriptions issued  Morbid obesity patient has been watching diet and trying to lose weight

## 2019-06-30 NOTE — Patient Instructions (Signed)
Medication Instructions:  Your physician recommends that you continue on your current medications as directed. Please refer to the Current Medication list given to you today.  *If you need a refill on your cardiac medications before your next appointment, please call your pharmacy*  Lab Work: [place]   If you have labs (blood work) drawn today and your tests are completely normal, you will receive your results only by: Marland Kitchen MyChart Message (if you have MyChart) OR . A paper copy in the mail If you have any lab test that is abnormal or we need to change your treatment, we will call you to review the results.  Testing/Procedures: NONE ORDERED  TODAY   Follow-Up: At [facility], you and your health needs are our priority.  As part of our continuing mission to provide you with exceptional heart care, we have created designated Provider Care Teams.  These Care Teams include your primary Cardiologist (physician) and Advanced Practice Providers (APPs -  Physician Assistants and Nurse Practitioners) who all work together to provide you with the care you need, when you need it.  Your next appointment:   2 Months   The format for your next appointment:   In Person  Provider:   You may see Will Meredith Leeds, MD or one of the following Advanced Practice Providers on your designated Care Team:    Chanetta Marshall, NP  Tommye Standard, PA-C  Legrand Como "Oda Kilts, [place]   Other Instructions

## 2019-06-30 NOTE — Telephone Encounter (Signed)
Spoke with wife regarding change in dates.  New instructions will be mailed to patient.

## 2019-06-30 NOTE — Telephone Encounter (Signed)
Spoke with patients wife today.  Advised to hold Xarelto 2 days prior per Dr. Harl Bowie.  She verbalized understanding and will let her husband know.

## 2019-06-30 NOTE — Telephone Encounter (Signed)
  Patient needs help with machine. Wife states the lights will not stop flashing.

## 2019-07-07 ENCOUNTER — Other Ambulatory Visit: Payer: Self-pay | Admitting: Family Medicine

## 2019-07-07 NOTE — Progress Notes (Signed)
Remote ICD transmission.   

## 2019-07-08 ENCOUNTER — Other Ambulatory Visit: Payer: Self-pay | Admitting: Cardiovascular Disease

## 2019-07-12 ENCOUNTER — Other Ambulatory Visit: Payer: Self-pay | Admitting: Family Medicine

## 2019-07-18 ENCOUNTER — Other Ambulatory Visit: Payer: Self-pay | Admitting: Family Medicine

## 2019-07-22 ENCOUNTER — Telehealth: Payer: Self-pay | Admitting: Family Medicine

## 2019-07-22 NOTE — Telephone Encounter (Signed)
Pt called pharmacy to get refill on HYDROcodone-acetaminophen (NORCO) 10-325 MG tablet they are out of medication and wont have it until Dec 2nd or 3rd. Pt would like to know if it can be transferred to another pharmacy. He only has one day left of medication.

## 2019-07-22 NOTE — Telephone Encounter (Signed)
Contacted patient. Pt states he has not called any other pharmacies at this time. Informed patient to contact [facility] and see if they had any pain pills. Encourage pt to call other pharmacies to see if they had any in stock. Instructed patient to call us in the morning with which pharmacy. Pt verbalized understanding.

## 2019-07-23 ENCOUNTER — Telehealth: Payer: Self-pay | Admitting: Family Medicine

## 2019-07-23 MED ORDER — HYDROCODONE-ACETAMINOPHEN 10-325 MG PO TABS: ORAL_TABLET | ORAL | 0 refills | Status: DC

## 2019-07-23 NOTE — Telephone Encounter (Signed)
Patient requesting refill on hydrocodone to be faxed into [phone]

## 2019-07-23 NOTE — Telephone Encounter (Signed)
I have pended the med and put in [facility] if you agree to send there it is ready to sign

## 2019-07-23 NOTE — Telephone Encounter (Signed)
I did send in the prescription as pended thank you

## 2019-07-23 NOTE — Telephone Encounter (Signed)
Pt.notified

## 2019-07-23 NOTE — Telephone Encounter (Signed)
See other message from today

## 2019-07-23 NOTE — Telephone Encounter (Signed)
Patient requesting refill on hydrocodone to be faxed into [phone]

## 2019-07-26 ENCOUNTER — Other Ambulatory Visit (HOSPITAL_COMMUNITY): Payer: Medicare Other

## 2019-07-30 IMAGING — DX DG CHEST 2V
2 series · 2 of 2 positions shown · non-contrast
Comparison: 09/11/2016

CLINICAL DATA: Congestion, cough

EXAM:
CHEST  2 VIEW

[chest pa]
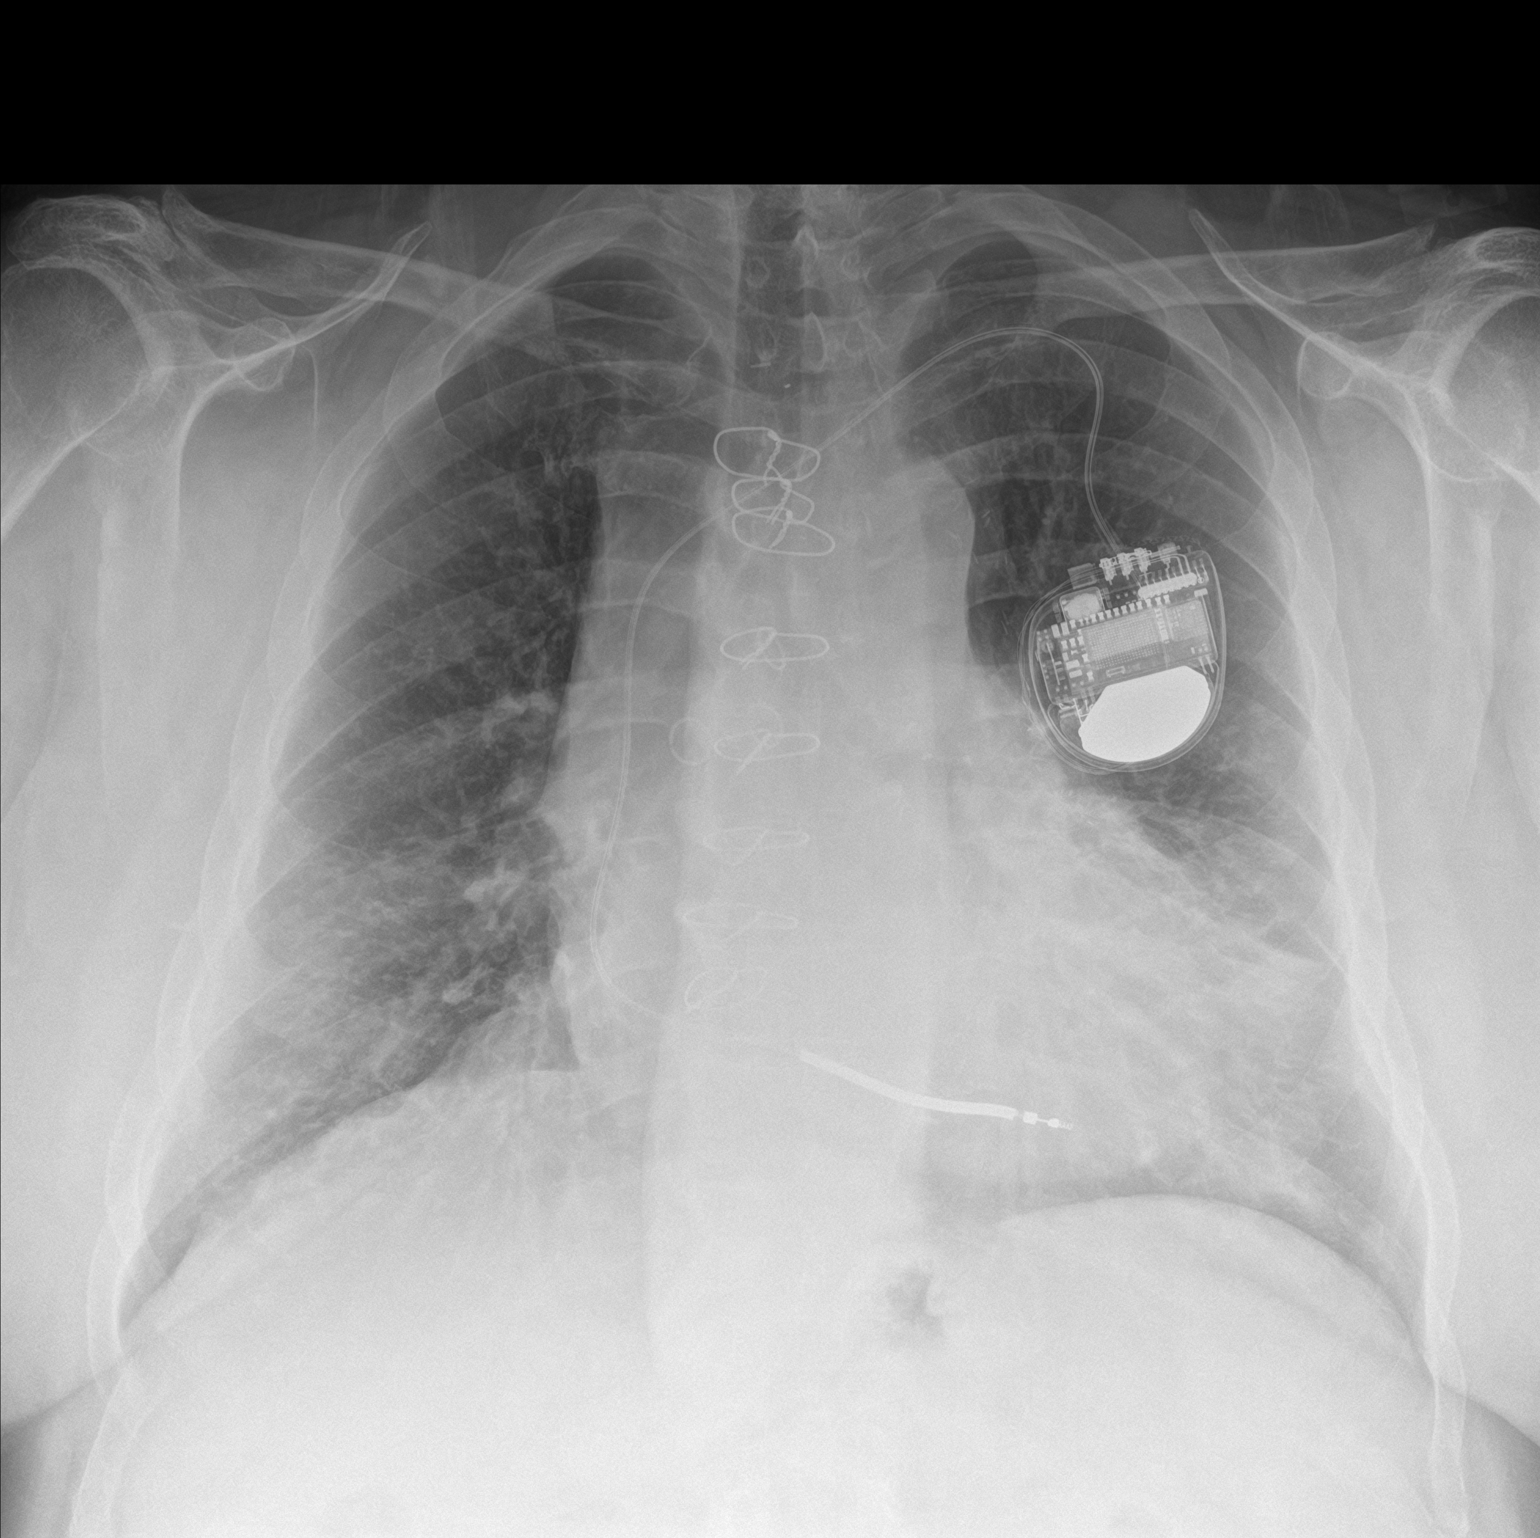

[chest lat]
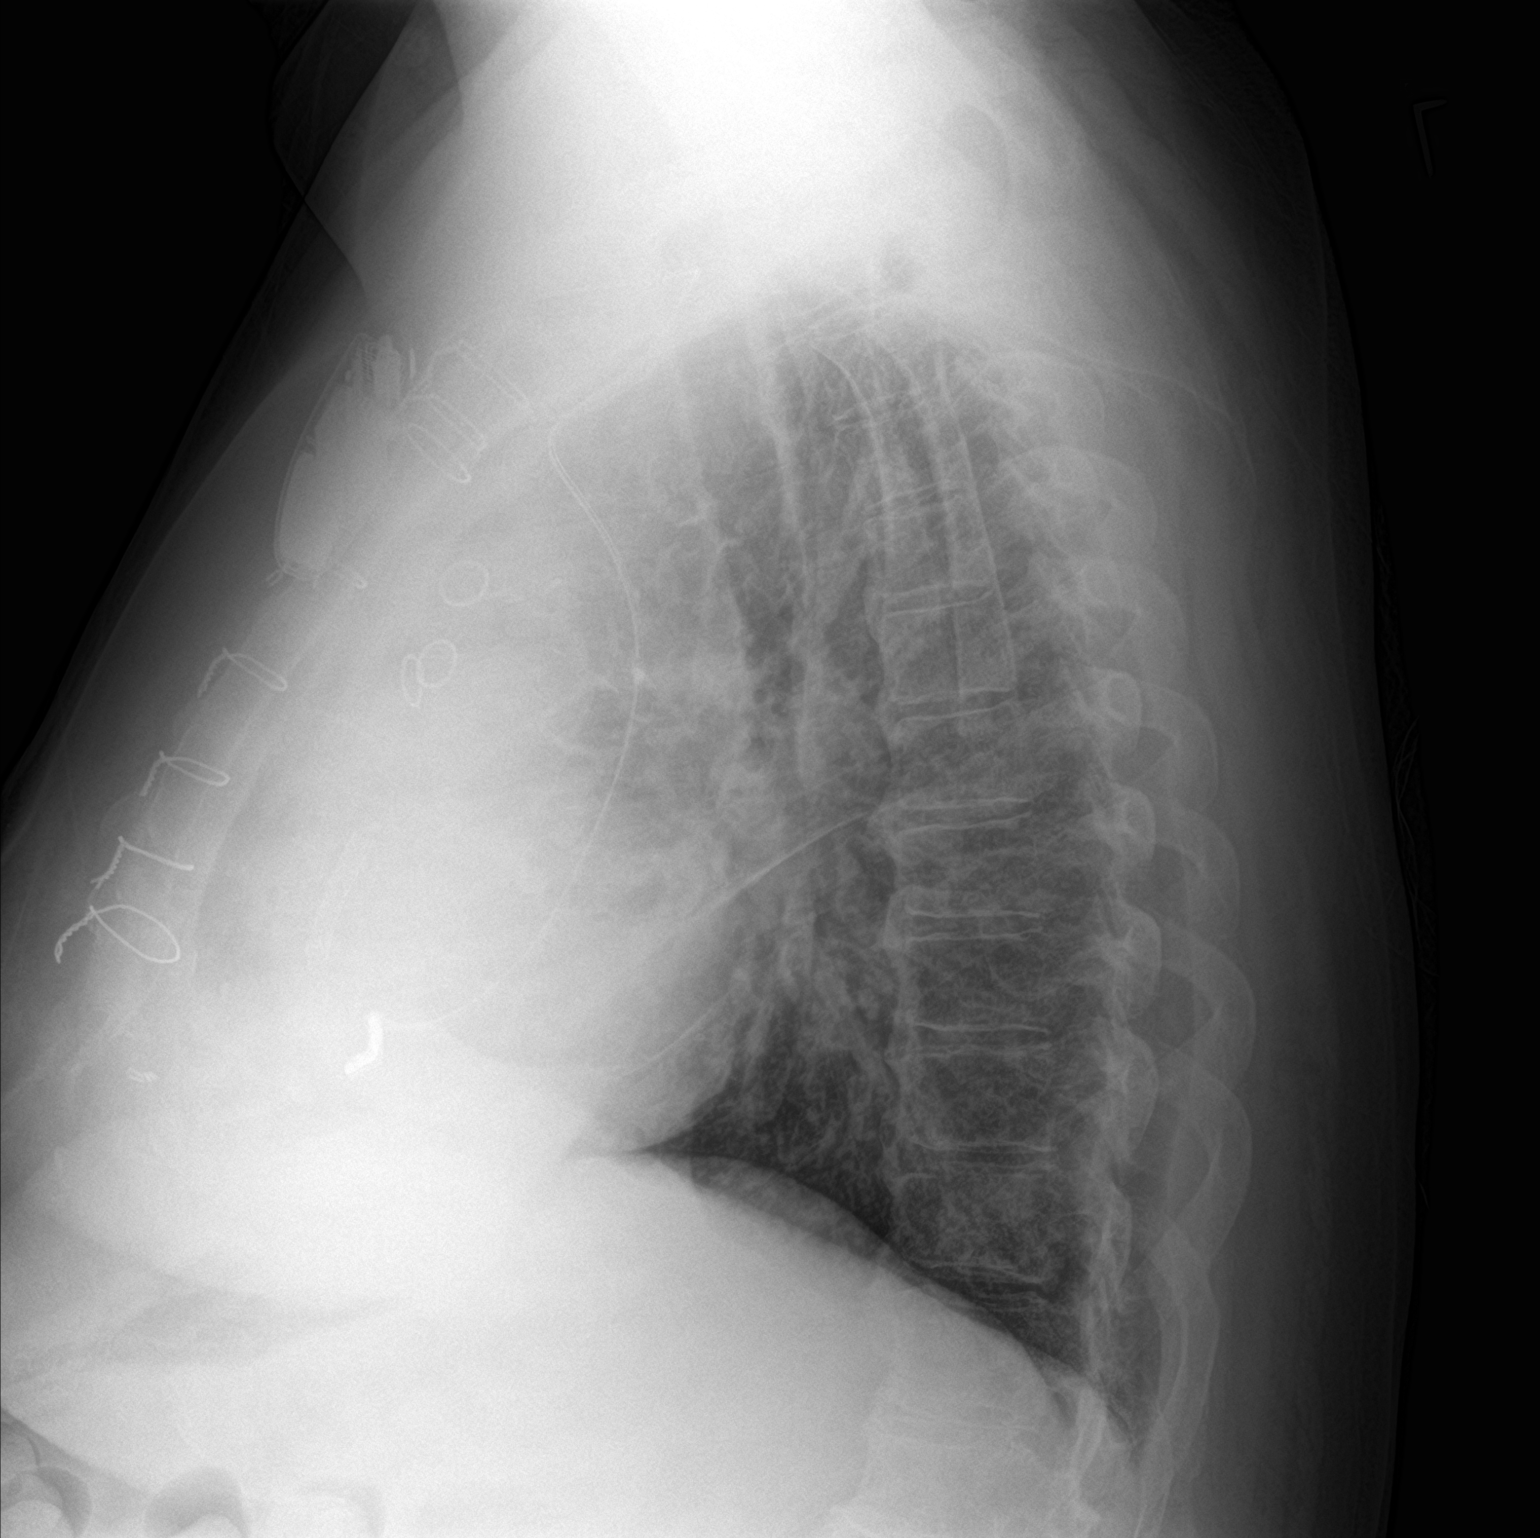

[2 of 2 positions shown; findings below may reference images not displayed]

FINDINGS: Left AICD remains in place, unchanged. Prior CABG. Cardiomegaly. No
confluent airspace opacities or effusions. No acute bony
abnormality.
IMPRESSION: Cardiomegaly.  No acute findings.

## 2019-07-31 ENCOUNTER — Other Ambulatory Visit (HOSPITAL_COMMUNITY)
Admission: RE | Admit: 2019-07-31 | Discharge: 2019-07-31 | Disposition: A | Discharge status: Home / Self Care | Payer: Medicare Other | Source: Ambulatory Visit | Attending: Internal Medicine | Admitting: Internal Medicine

## 2019-07-31 DIAGNOSIS — Z20828 Contact with and (suspected) exposure to other viral communicable diseases: Secondary | ICD-10-CM | POA: Diagnosis not present

## 2019-07-31 DIAGNOSIS — E876 Hypokalemia: Secondary | ICD-10-CM | POA: Diagnosis not present

## 2019-07-31 DIAGNOSIS — Z01812 Encounter for preprocedural laboratory examination: Secondary | ICD-10-CM | POA: Diagnosis not present

## 2019-08-01 ENCOUNTER — Encounter (HOSPITAL_COMMUNITY): Payer: Self-pay | Admitting: Certified Registered"

## 2019-08-01 LAB — BASIC METABOLIC PANEL WITH GFR
BUN/Creatinine Ratio: 16 (ref 10–24)
BUN: 26 mg/dL (ref 8–27)
CO2: 26 mmol/L (ref 20–29)
Calcium: 9.8 mg/dL (ref 8.6–10.2)
Chloride: 93 mmol/L — ABNORMAL LOW (ref 96–106)
Creatinine, Ser: 1.64 mg/dL — ABNORMAL HIGH (ref 0.76–1.27)
GFR calc Af Amer: 49 mL/min/{1.73_m2} — ABNORMAL LOW
GFR calc non Af Amer: 42 mL/min/{1.73_m2} — ABNORMAL LOW
Glucose: 186 mg/dL — ABNORMAL HIGH (ref 65–99)
Potassium: 2.8 mmol/L — ABNORMAL LOW (ref 3.5–5.2)
Sodium: 138 mmol/L (ref 134–144)

## 2019-08-01 LAB — MAGNESIUM: Magnesium: 1.9 mg/dL (ref 1.6–2.3)

## 2019-08-03 LAB — NOVEL CORONAVIRUS, NAA (HOSP ORDER, SEND-OUT TO REF LAB; TAT 18-24 HRS): SARS-CoV-2, NAA: NOT DETECTED

## 2019-08-04 ENCOUNTER — Ambulatory Visit (HOSPITAL_COMMUNITY): Admission: RE | Admit: 2019-08-04 | Payer: Medicare Other | Source: Home / Self Care | Admitting: Internal Medicine

## 2019-08-04 ENCOUNTER — Encounter (HOSPITAL_COMMUNITY): Admission: RE | Payer: Self-pay | Source: Home / Self Care

## 2019-08-04 SURGERY — COLONOSCOPY WITH PROPOFOL
Anesthesia: Monitor Anesthesia Care

## 2019-08-07 ENCOUNTER — Other Ambulatory Visit: Payer: Self-pay | Admitting: *Deleted

## 2019-08-07 DIAGNOSIS — E876 Hypokalemia: Secondary | ICD-10-CM | POA: Diagnosis not present

## 2019-08-07 DIAGNOSIS — R972 Elevated prostate specific antigen [PSA]: Secondary | ICD-10-CM | POA: Diagnosis not present

## 2019-08-08 LAB — BASIC METABOLIC PANEL
BUN/Creatinine Ratio: 14 (ref 10–24)
BUN: 24 mg/dL (ref 8–27)
CO2: 27 mmol/L (ref 20–29)
Calcium: 8.6 mg/dL (ref 8.6–10.2)
Chloride: 94 mmol/L — ABNORMAL LOW (ref 96–106)
Creatinine, Ser: 1.7 mg/dL — ABNORMAL HIGH (ref 0.76–1.27)
GFR calc Af Amer: 47 mL/min/{1.73_m2} — ABNORMAL LOW (ref >59)
GFR calc non Af Amer: 41 mL/min/{1.73_m2} — ABNORMAL LOW (ref >59)
Glucose: 202 mg/dL — ABNORMAL HIGH (ref 65–99)
Potassium: 3.6 mmol/L (ref 3.5–5.2)
Sodium: 140 mmol/L (ref 134–144)

## 2019-08-08 LAB — MAGNESIUM: Magnesium: 2.1 mg/dL (ref 1.6–2.3)

## 2019-08-25 ENCOUNTER — Ambulatory Visit (INDEPENDENT_AMBULATORY_CARE_PROVIDER_SITE_OTHER): Payer: Medicare Other | Admitting: Family Medicine

## 2019-08-25 ENCOUNTER — Encounter (HOSPITAL_COMMUNITY): Payer: Self-pay

## 2019-08-25 ENCOUNTER — Inpatient Hospital Stay (HOSPITAL_COMMUNITY)
Admission: EM | Admit: 2019-08-25 | Discharge: 2019-08-28 | DRG: 177 | Disposition: A | Discharge status: Home / Self Care | Payer: Medicare Other | Attending: Internal Medicine | Admitting: Internal Medicine

## 2019-08-25 ENCOUNTER — Telehealth: Payer: Self-pay | Admitting: Emergency Medicine

## 2019-08-25 ENCOUNTER — Emergency Department (HOSPITAL_COMMUNITY): Payer: Medicare Other

## 2019-08-25 ENCOUNTER — Inpatient Hospital Stay
Admission: EM | Admit: 2019-08-25 | Payer: Medicare Other | Source: Other Acute Inpatient Hospital | Admitting: Pulmonary Disease

## 2019-08-25 ENCOUNTER — Other Ambulatory Visit: Payer: Self-pay

## 2019-08-25 DIAGNOSIS — Z79899 Other long term (current) drug therapy: Secondary | ICD-10-CM

## 2019-08-25 DIAGNOSIS — Z6839 Body mass index (BMI) 39.0-39.9, adult: Secondary | ICD-10-CM

## 2019-08-25 DIAGNOSIS — I4901 Ventricular fibrillation: Secondary | ICD-10-CM

## 2019-08-25 DIAGNOSIS — Z9841 Cataract extraction status, right eye: Secondary | ICD-10-CM

## 2019-08-25 DIAGNOSIS — Z9842 Cataract extraction status, left eye: Secondary | ICD-10-CM

## 2019-08-25 DIAGNOSIS — I255 Ischemic cardiomyopathy: Secondary | ICD-10-CM | POA: Diagnosis present

## 2019-08-25 DIAGNOSIS — E1165 Type 2 diabetes mellitus with hyperglycemia: Secondary | ICD-10-CM | POA: Diagnosis present

## 2019-08-25 DIAGNOSIS — R197 Diarrhea, unspecified: Secondary | ICD-10-CM | POA: Diagnosis present

## 2019-08-25 DIAGNOSIS — E785 Hyperlipidemia, unspecified: Secondary | ICD-10-CM | POA: Diagnosis present

## 2019-08-25 DIAGNOSIS — E872 Acidosis: Secondary | ICD-10-CM | POA: Diagnosis present

## 2019-08-25 DIAGNOSIS — E876 Hypokalemia: Secondary | ICD-10-CM | POA: Diagnosis present

## 2019-08-25 DIAGNOSIS — N183 Chronic kidney disease, stage 3 unspecified: Secondary | ICD-10-CM | POA: Diagnosis present

## 2019-08-25 DIAGNOSIS — E1122 Type 2 diabetes mellitus with diabetic chronic kidney disease: Secondary | ICD-10-CM | POA: Diagnosis present

## 2019-08-25 DIAGNOSIS — M1990 Unspecified osteoarthritis, unspecified site: Secondary | ICD-10-CM | POA: Diagnosis present

## 2019-08-25 DIAGNOSIS — Z953 Presence of xenogenic heart valve: Secondary | ICD-10-CM

## 2019-08-25 DIAGNOSIS — I110 Hypertensive heart disease with heart failure: Secondary | ICD-10-CM | POA: Diagnosis present

## 2019-08-25 DIAGNOSIS — Z7901 Long term (current) use of anticoagulants: Secondary | ICD-10-CM

## 2019-08-25 DIAGNOSIS — G4733 Obstructive sleep apnea (adult) (pediatric): Secondary | ICD-10-CM | POA: Diagnosis present

## 2019-08-25 DIAGNOSIS — I472 Ventricular tachycardia, unspecified: Secondary | ICD-10-CM

## 2019-08-25 DIAGNOSIS — Z888 Allergy status to other drugs, medicaments and biological substances status: Secondary | ICD-10-CM

## 2019-08-25 DIAGNOSIS — I480 Paroxysmal atrial fibrillation: Secondary | ICD-10-CM | POA: Diagnosis present

## 2019-08-25 DIAGNOSIS — J1289 Other viral pneumonia: Secondary | ICD-10-CM | POA: Diagnosis present

## 2019-08-25 DIAGNOSIS — Z95810 Presence of automatic (implantable) cardiac defibrillator: Secondary | ICD-10-CM

## 2019-08-25 DIAGNOSIS — Z87442 Personal history of urinary calculi: Secondary | ICD-10-CM

## 2019-08-25 DIAGNOSIS — G8929 Other chronic pain: Secondary | ICD-10-CM | POA: Diagnosis present

## 2019-08-25 DIAGNOSIS — I959 Hypotension, unspecified: Secondary | ICD-10-CM | POA: Diagnosis present

## 2019-08-25 DIAGNOSIS — I209 Angina pectoris, unspecified: Secondary | ICD-10-CM | POA: Diagnosis not present

## 2019-08-25 DIAGNOSIS — Z9089 Acquired absence of other organs: Secondary | ICD-10-CM

## 2019-08-25 DIAGNOSIS — M545 Low back pain: Secondary | ICD-10-CM | POA: Diagnosis present

## 2019-08-25 DIAGNOSIS — R0603 Acute respiratory distress: Secondary | ICD-10-CM | POA: Diagnosis not present

## 2019-08-25 DIAGNOSIS — R5383 Other fatigue: Secondary | ICD-10-CM | POA: Diagnosis not present

## 2019-08-25 DIAGNOSIS — Z952 Presence of prosthetic heart valve: Secondary | ICD-10-CM | POA: Diagnosis not present

## 2019-08-25 DIAGNOSIS — E860 Dehydration: Secondary | ICD-10-CM | POA: Diagnosis present

## 2019-08-25 DIAGNOSIS — Z79891 Long term (current) use of opiate analgesic: Secondary | ICD-10-CM

## 2019-08-25 DIAGNOSIS — I10 Essential (primary) hypertension: Secondary | ICD-10-CM | POA: Diagnosis present

## 2019-08-25 DIAGNOSIS — I5042 Chronic combined systolic (congestive) and diastolic (congestive) heart failure: Secondary | ICD-10-CM | POA: Diagnosis not present

## 2019-08-25 DIAGNOSIS — I5022 Chronic systolic (congestive) heart failure: Secondary | ICD-10-CM | POA: Diagnosis present

## 2019-08-25 DIAGNOSIS — Z8249 Family history of ischemic heart disease and other diseases of the circulatory system: Secondary | ICD-10-CM

## 2019-08-25 DIAGNOSIS — E1121 Type 2 diabetes mellitus with diabetic nephropathy: Secondary | ICD-10-CM | POA: Diagnosis present

## 2019-08-25 DIAGNOSIS — J9601 Acute respiratory failure with hypoxia: Secondary | ICD-10-CM | POA: Diagnosis not present

## 2019-08-25 DIAGNOSIS — I252 Old myocardial infarction: Secondary | ICD-10-CM

## 2019-08-25 DIAGNOSIS — Z951 Presence of aortocoronary bypass graft: Secondary | ICD-10-CM

## 2019-08-25 DIAGNOSIS — U071 COVID-19: Secondary | ICD-10-CM | POA: Diagnosis present

## 2019-08-25 DIAGNOSIS — N179 Acute kidney failure, unspecified: Secondary | ICD-10-CM | POA: Diagnosis present

## 2019-08-25 DIAGNOSIS — I447 Left bundle-branch block, unspecified: Secondary | ICD-10-CM | POA: Diagnosis present

## 2019-08-25 DIAGNOSIS — I25119 Atherosclerotic heart disease of native coronary artery with unspecified angina pectoris: Secondary | ICD-10-CM | POA: Diagnosis present

## 2019-08-25 DIAGNOSIS — Z87891 Personal history of nicotine dependence: Secondary | ICD-10-CM

## 2019-08-25 DIAGNOSIS — R0902 Hypoxemia: Secondary | ICD-10-CM

## 2019-08-25 DIAGNOSIS — Z794 Long term (current) use of insulin: Secondary | ICD-10-CM

## 2019-08-25 DIAGNOSIS — Z955 Presence of coronary angioplasty implant and graft: Secondary | ICD-10-CM

## 2019-08-25 DIAGNOSIS — I5043 Acute on chronic combined systolic (congestive) and diastolic (congestive) heart failure: Secondary | ICD-10-CM | POA: Diagnosis not present

## 2019-08-25 DIAGNOSIS — Z9049 Acquired absence of other specified parts of digestive tract: Secondary | ICD-10-CM

## 2019-08-25 DIAGNOSIS — I440 Atrioventricular block, first degree: Secondary | ICD-10-CM | POA: Diagnosis present

## 2019-08-25 DIAGNOSIS — Z833 Family history of diabetes mellitus: Secondary | ICD-10-CM

## 2019-08-25 LAB — CBC WITH DIFFERENTIAL/PLATELET
Abs Immature Granulocytes: 0.04 10*3/uL (ref 0.00–0.07)
Basophils Absolute: 0 10*3/uL (ref 0.0–0.1)
Basophils Relative: 0 %
Eosinophils Absolute: 0 10*3/uL (ref 0.0–0.5)
Eosinophils Relative: 0 %
HCT: 45.2 % (ref 39.0–52.0)
Hemoglobin: 14.7 g/dL (ref 13.0–17.0)
Immature Granulocytes: 1 %
Lymphocytes Relative: 8 %
Lymphs Abs: 0.5 10*3/uL — ABNORMAL LOW (ref 0.7–4.0)
MCH: 31.5 pg (ref 26.0–34.0)
MCHC: 32.5 g/dL (ref 30.0–36.0)
MCV: 96.8 fL (ref 80.0–100.0)
Monocytes Absolute: 0.4 10*3/uL (ref 0.1–1.0)
Monocytes Relative: 7 %
Neutro Abs: 4.7 10*3/uL (ref 1.7–7.7)
Neutrophils Relative %: 84 %
Platelets: 194 10*3/uL (ref 150–400)
RBC: 4.67 MIL/uL (ref 4.22–5.81)
RDW: 17 % — ABNORMAL HIGH (ref 11.5–15.5)
WBC: 5.7 10*3/uL (ref 4.0–10.5)
nRBC: 0 % (ref 0.0–0.2)

## 2019-08-25 LAB — COMPREHENSIVE METABOLIC PANEL
ALT: 16 U/L (ref 0–44)
AST: 40 U/L (ref 15–41)
Albumin: 3.1 g/dL — ABNORMAL LOW (ref 3.5–5.0)
Alkaline Phosphatase: 91 U/L (ref 38–126)
Anion gap: 20 — ABNORMAL HIGH (ref 5–15)
BUN: 33 mg/dL — ABNORMAL HIGH (ref 8–23)
CO2: 41 mmol/L — ABNORMAL HIGH (ref 22–32)
Calcium: 7.4 mg/dL — ABNORMAL LOW (ref 8.9–10.3)
Chloride: 74 mmol/L — ABNORMAL LOW (ref 98–111)
Creatinine, Ser: 1.28 mg/dL — ABNORMAL HIGH (ref 0.61–1.24)
GFR calc Af Amer: >60 mL/min (ref >60)
GFR calc non Af Amer: 57 mL/min — ABNORMAL LOW (ref >60)
Glucose, Bld: 178 mg/dL — ABNORMAL HIGH (ref 70–99)
Potassium: <2 mmol/L — CL (ref 3.5–5.1)
Sodium: 135 mmol/L (ref 135–145)
Total Bilirubin: 3.7 mg/dL — ABNORMAL HIGH (ref 0.3–1.2)
Total Protein: 7.5 g/dL (ref 6.5–8.1)

## 2019-08-25 LAB — TROPONIN I (HIGH SENSITIVITY)
Troponin I (High Sensitivity): 19 ng/L — ABNORMAL HIGH (ref <18)
Troponin I (High Sensitivity): 24 ng/L — ABNORMAL HIGH (ref <18)

## 2019-08-25 LAB — POC SARS CORONAVIRUS 2 AG -  ED: SARS Coronavirus 2 Ag: NEGATIVE

## 2019-08-25 LAB — MAGNESIUM: Magnesium: 1.9 mg/dL (ref 1.7–2.4)

## 2019-08-25 LAB — RESPIRATORY PANEL BY RT PCR (FLU A&B, COVID)
Influenza A by PCR: NEGATIVE
Influenza B by PCR: NEGATIVE
SARS Coronavirus 2 by RT PCR: POSITIVE — AB

## 2019-08-25 LAB — ETHANOL: Alcohol, Ethyl (B): <10 mg/dL (ref <10)

## 2019-08-25 MED ORDER — POTASSIUM CHLORIDE 10 MEQ/100ML IV SOLN
10.0000 meq | Freq: Once | INTRAVENOUS | Status: AC
  Administered 2019-08-25: 10 meq via INTRAVENOUS

## 2019-08-25 MED ORDER — POTASSIUM CHLORIDE 10 MEQ/100ML IV SOLN
10.0000 meq | Freq: Once | INTRAVENOUS | Status: AC
  Administered 2019-08-26: 10 meq via INTRAVENOUS
  Filled 2019-08-25: qty 100

## 2019-08-25 MED ORDER — ASPIRIN 81 MG PO CHEW
324.0000 mg | CHEWABLE_TABLET | Freq: Once | ORAL | Status: AC
  Administered 2019-08-25: 324 mg via ORAL
  Filled 2019-08-25: qty 4

## 2019-08-25 MED ORDER — MAGNESIUM SULFATE 2 GM/50ML IV SOLN
2.0000 g | INTRAVENOUS | Status: AC
  Administered 2019-08-25: 2 g via INTRAVENOUS
  Filled 2019-08-25: qty 50

## 2019-08-25 NOTE — Progress Notes (Signed)
   Subjective:  Audio only  Patient ID: [name] [name], male    DOB: 25-Nov-1950, 68 y.o.   MRN: [id number]  HPI Pt was around some people that tested positive for COVID. Pt did not get tested just presumed he had COVID due to being around people that had COVID. Symptoms stated on 08/11/2019. Pt is not able to eat anything, has lost about 15 pounds, nausea, light headed, diarrhea and some shortness of breath. Pt has had to take Nitro several times. Cindy from [facility] called to let us know that he was shocked at 3:15 from defibrillator.   Virtual Visit via Telephone Note  I connected with [name] [name] on 08/25/19 at  4:10 PM EST by telephone and verified that I am speaking with the correct person using two identifiers.  Location: Patient: home Provider: office   I discussed the limitations, risks, security and privacy concerns of performing an evaluation and management service by telephone and the availability of in person appointments. I also discussed with the patient that there may be a patient responsible charge related to this service. The patient expressed understanding and agreed to proceed.   History of Present Illness:    Observations/Objective:   Assessment and Plan:   Follow Up Instructions:    I discussed the assessment and treatment plan with the patient. The patient was provided an opportunity to ask questions and all were answered. The patient agreed with the plan and demonstrated an understanding of the instructions.   The patient was advised to call back or seek an in-person evaluation if the symptoms worsen or if the condition fails to improve as anticipated.  I provide 22 minutes of non-face-to-face time during this encounter.   Vicente Males, LPN  Patient having difficulty.  Speaking.  Clearly short of breath.  No shortness of breath.  Notes progressive cough.  2 weeks duration.  Notes progressive weakness.  No appetite no eating or  drinking  Notes he passed out earlier this afternoon  Since is received a phone call that his automatic defibrillator went off  Known positive COVID-19 exposures  Review of Systems See above    Objective:   Physical Exam   Virtual     Assessment & Plan:  Impression serious presentation with difficulty speaking/evidence of pulmonary compromise/evidence of cardiac compromise with defibrillator having gone off/personal compromise unable to make good decisions currently.  Conversation with wife.  I encouraged her to call EMS.  We also called the emergency room.  I spoke with the ER doctor.  Greater than 50% of this 25 minute none face to face visit was spent in counseling and discussion and coordination of care regarding the above diagnosis/diagnosies

## 2019-08-25 NOTE — ED Triage Notes (Signed)
Pt reports hasn't rested for 2 days and feeling very fatigued and nauseated.   Today his defibrillator went off.  RN note reports pt had treated episode of vtach.  Pt says he passed out. Office staff told er staff pt tested positive for covid but pt says he hasn't been tested for it yet. Pt says he has been exposed to covid.

## 2019-08-25 NOTE — ED Provider Notes (Signed)
[facility] EMERGENCY DEPARTMENT Provider Note   CSN: [id number] Arrival date & time: 08/25/19  1641     History Chief Complaint  Patient presents with  . Episode of v tach    [name] [name] is a 68 y.o. male.  HPI   This patient is a very ill-appearing 68 year old male, states that he has been severely fatigued and nauseated for a couple of days and today actually felt his defibrillator go off.  The patient states that he thinks he may have had an arrhythmia that caused it.  He states that he did pass out.  The patient reports he has not been tested for Covid but has been around people with Covid.  This patient is very difficult to understand as he is fatigued, somnolent, will open his eyes and is slurring his words a little bit.  He states he is short of breath, weak and keeps saying "I need my nitro".  Level 5 caveat applies.  Past Medical History:  Diagnosis Date  . AICD (automatic cardioverter/defibrillator) present 05/18/2016  . Anginal pain ([place])    occ none recent  . Aortic stenosis   . Arthritis   . CAD (coronary artery disease)   . Cardiomyopathy, ischemic   . CHF (congestive heart failure) ([place])   . Chronic back pain   . Diabetes mellitus without complication ([place])   . Elevated PSA   . History of kidney stones   . Hyperlipidemia   . Morbid obesity ([place])   . Myocardial infarction ([place])   . OSA (obstructive sleep apnea)    does not wear CPAP, -order run out told need to redo  test  . Paroxysmal atrial fibrillation ([place]) 09/29/2018   This was found on event monitor October 2019  . 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 IDA (iron deficiency anemia) 11/13/2018  . CKD stage 2  due to type 2 diabetes mellitus ([place])   . Acute gastric ulcer with hemorrhage   . Acute upper GI bleed 10/02/2018  . GI bleed 10/01/2018  . Acute blood loss anemia   . PAF (paroxysmal atrial fibrillation) ([place]) 09/29/2018  . 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 Aortic stenosis   . S/P nasal septoplasty 04/25/2017  . AICD (automatic cardioverter/defibrillator) present 11/22/2016  . CKD (chronic kidney disease) stage 3, GFR 30-59 ml/min   . Acute kidney injury ([place])   . Difficulty in walking, not elsewhere classified   . Pain in the abdomen   . Anasarca   . Constipation   . Dyspnea 09/11/2016  . Prolonged Q-T interval on ECG 09/11/2016  . Hypokalemia 09/11/2016  . Chest pain 10/22/2015  . Type 2 diabetes with nephropathy ([place]) 10/22/2015  . Essential hypertension   . Chronic lumbar pain 01/01/2015  . Obstructive sleep apnea 10/02/2014  . Cardiomyopathy, ischemic 06/25/2014  . Chronic combined systolic and diastolic CHF (congestive heart failure) ([place]) 10/29/2013  . Right ankle pain 08/14/2013  . Hyperlipemia 05/14/2013  . CAD (coronary artery disease) 05/14/2013  . Class 2 severe obesity due to excess calories with serious comorbidity and body mass index (BMI) of 39.0 to 39.9 in adult ([place]) 05/14/2013  . Elevated  PSA 05/14/2013  . 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 APPENDECTOMY    . BIOPSY  10/02/2018   Procedure: BIOPSY;  Surgeon: Daneil Dolin, MD;  [facility];  Service: Endoscopy;;  gastric  . CARDIAC CATHETERIZATION     stents  . CAROTID STENT     denies carotid stent  . CATARACT EXTRACTION, BILATERAL    . CORONARY ARTERY BYPASS GRAFT  12/23/1999   LIMA to LAD, SVG to ramus, sequential SVG to OM2-RPL, sequential SVG to AM-PDA  . EP IMPLANTABLE DEVICE N/A 05/18/2016   Procedure: ICD Implant;  Surgeon: Will Meredith Leeds, MD;  Location: [place] CV LAB;  Service: Cardiovascular;   Laterality: N/A;  . ESOPHAGOGASTRODUODENOSCOPY (EGD) WITH PROPOFOL N/A 10/02/2018   Procedure: ESOPHAGOGASTRODUODENOSCOPY (EGD) WITH PROPOFOL;  Surgeon: Daneil Dolin, MD;  [facility];  Service: Endoscopy;  Laterality: N/A;  . EYE SURGERY    . ingrown toenail N/A   . LEFT HEART CATHETERIZATION WITH CORONARY/GRAFT ANGIOGRAM N/A 12/21/2014   Procedure: LEFT HEART CATHETERIZATION WITH Beatrix Fetters;  Surgeon: Belva Crome, MD;  Location: [facility] CATH LAB;  Service: Cardiovascular;  Laterality: N/A;  . NASAL SEPTOPLASTY W/ TURBINOPLASTY Bilateral 04/25/2017   NASAL SEPTOPLASTY WITH TURBINATE REDUCTION/notes 04/25/2017  . NASAL SEPTOPLASTY W/ TURBINOPLASTY Bilateral 04/25/2017   Procedure: NASAL SEPTOPLASTY WITH TURBINATE REDUCTION;  Surgeon: Leta Baptist, MD;  Location: [place];  Service: ENT;  Laterality: Bilateral;  . RIGHT/LEFT HEART CATH AND CORONARY/GRAFT ANGIOGRAPHY N/A 10/05/2017   Procedure: RIGHT/LEFT HEART CATH AND CORONARY/GRAFT ANGIOGRAPHY;  Surgeon: Sherren Mocha, MD;  Location: [place] CV LAB;  Service: Cardiovascular;  Laterality: N/A;  . TEE WITHOUT CARDIOVERSION N/A 11/20/2017   Procedure: TRANSESOPHAGEAL ECHOCARDIOGRAM (TEE);  Surgeon: Sherren Mocha, MD;  Location: [place];  Service: Open Heart Surgery;  Laterality: N/A;  . TONSILLECTOMY    . TRANSCATHETER AORTIC VALVE REPLACEMENT, TRANSFEMORAL N/A 11/20/2017   Procedure: TRANSCATHETER AORTIC VALVE REPLACEMENT, TRANSFEMORAL;  Surgeon: Sherren Mocha, MD;  Location: [place];  Service: Open Heart Surgery;  Laterality: N/A;       Family History  Adopted: Yes  Problem Relation Age of Onset  . Diabetes Mother   . CAD Mother   . Colon cancer Neg Hx   . Colon polyps Neg Hx     Social History   Tobacco Use  . Smoking status: Former Smoker    Quit date: 08/29/1999    Years since quitting: 20.0  . Smokeless tobacco: Never Used  . Tobacco comment: couldn't remember date  Substance Use Topics  . Alcohol use: No     Alcohol/week: 0.0 standard drinks  . Drug use: No    Home Medications Prior to Admission medications   Medication Sig Start Date End Date Taking? Authorizing Provider  acetaminophen (TYLENOL) 500 MG tablet Take 1 tablet (500 mg total) by mouth 2 (two) times daily. Patient taking differently: Take 1,500 mg by mouth daily as needed for moderate pain.  10/29/18   Kathyrn Drown, MD  blood glucose meter kit and supplies KIT Dispense based on patient and insurance preference. Test  up to four times daily as directed.Dx: E11.9 06/30/19   Kathyrn Drown, MD  calcium carbonate (TUMS - DOSED IN MG ELEMENTAL CALCIUM) 500 MG chewable tablet Chew 2 tablets by mouth 2 (two) times daily as needed for indigestion or heartburn.     [provider]  carvedilol (COREG) 3.125 MG  tablet TAKE (1) TABLET BY MOUTH TWICE DAILY WITH A MEAL. Patient taking differently: Take 3.125 mg by mouth 2 (two) times daily with a meal.  06/12/19   Luking, Elayne Snare, MD  ferrous sulfate 325 (65 FE) MG tablet Take 325 mg by mouth daily.    [provider]  FIBER PO Take 5 capsules by mouth daily.    [provider]  HYDROcodone-acetaminophen (NORCO) 10-325 MG tablet Take one tablet po twice daily prn pain Patient taking differently: Take 1 tablet by mouth 2 (two) times daily as needed for moderate pain.  07/23/19   Kathyrn Drown, MD  insulin aspart (NOVOLOG FLEXPEN) 100 UNIT/ML FlexPen Inject 60 Units into the skin 2 (two) times daily with a meal. 04/21/19   Luking, Elayne Snare, MD  LEVEMIR FLEXTOUCH 100 UNIT/ML Pen INJECT 60 UNITS INTO THE SKIN AT BEDTIME. 07/14/19   Luking, Scott A, MD  Lidocaine HCl (LIDOCAINE PLUS) 4 % CREA Apply 1 application topically daily as needed (itching).    [provider]  linaclotide Rolan Lipa) 290 MCG CAPS capsule Take 1 capsule (290 mcg total) by mouth daily before breakfast. 12/24/18   Kathyrn Drown, MD  liver oil-zinc oxide (DESITIN) 40 % ointment Apply 1 application  topically as needed for irritation.    [provider]  Menthol-Methyl Salicylate (GOODSENSE MUSCLE RUB) 8-30 % CREA Apply 1 application topically daily as needed (muscle pain).    [provider]  metolazone (ZAROXOLYN) 2.5 MG tablet TAKE ONE TABLET BY MOUTH DAILY. Patient taking differently: Take 2.5 mg by mouth See admin instructions. Take 2.5 mg on Friday, may take a second 2.5 mg dose during the week as needed for excessive fluid 09/02/18   Arnoldo Lenis, MD  Multiple Vitamins-Minerals (CENTRUM SILVER PO) Take 1 tablet by mouth daily.     [provider]  Na Sulfate-K Sulfate-Mg Sulf 17.5-3.13-1.6 GM/177ML SOLN Suprep-Use as directed 06/24/19   Willia Craze, NP  nitroGLYCERIN (NITROSTAT) 0.4 MG SL tablet PLACE 1 TABLET UNDER TONGUE FOR CHEST PAIN. MAY REPEAT EVERY 5 MIN UP TO 3 DOSES-NO RELIEF,CALL 911. Patient taking differently: Place 0.8 mg under the tongue every 5 (five) minutes as needed for chest pain.  07/09/19   Sherren Mocha, MD  oxymetazoline [facility] SINUS-MAX SINUS/ALLRGY) 0.05 % nasal spray Place 1 spray into both nostrils 2 (two) times daily as needed for congestion.    [provider]  pantoprazole (PROTONIX) 40 MG tablet Take 1 tablet (40 mg total) by mouth 2 (two) times daily. Patient taking differently: Take 40 mg by mouth 2 (two) times daily as needed (acid reflux).  10/09/18 10/09/19  Kathyrn Drown, MD  polycarbophil (FIBERCON) 625 MG tablet Take 2 tablets (1,250 mg total) by mouth daily with lunch. Hold if you have diarrhea 09/18/16   Rosita Fire, MD  Polyethyl Glycol-Propyl Glycol (SYSTANE) 0.4-0.3 % GEL ophthalmic gel Place 1 application into both eyes every other day.     [provider]  potassium chloride SA (KLOR-CON M20) 20 MEQ tablet Take 3 qam, 1 at lunch, and 3 at supper Patient taking differently: Take 40-60 mEq by mouth See admin instructions. Take 60 meq in the morning, 60 meq at afternoon, and 40 meq  at supper time 06/12/19   Kathyrn Drown, MD  rivaroxaban (XARELTO) 20 MG TABS tablet Take 1 tablet (20 mg total) by mouth daily with supper. 05/23/19   Arnoldo Lenis, MD  rosuvastatin (CRESTOR) 40 MG tablet  TAKE ONE TABLET BY MOUTH DAILY. Patient taking differently: Take 40 mg by mouth daily.  07/18/19   Kathyrn Drown, MD  spironolactone (ALDACTONE) 25 MG tablet TAKE 1 TABLET BY MOUTH ONCE A DAY. Patient taking differently: Take 25 mg by mouth every Friday.  04/07/19   Camnitz, Ocie Doyne, MD  SURE COMFORT PEN NEEDLES 31G X 8 MM MISC USING 4 TIMES DAILY. 04/07/19   Kathyrn Drown, MD  torsemide (DEMADEX) 20 MG tablet TAKE 80 MG IN THE MORNING & TAKE 60 MG IN THE EVENING. Patient taking differently: Take 60-80 mg by mouth See admin instructions. TAKE 80 MG IN THE MORNING & TAKE 60 MG IN THE EVENING. 06/26/19   Arnoldo Lenis, MD  traMADol (ULTRAM) 50 MG tablet TAKE 1 TABLET BY MOUTH EVERY 6 TO 8 HOURS AS NEEDED FOR HEADACHE. Patient taking differently: Take 50 mg by mouth every 8 (eight) hours as needed (headaches).  05/21/19   Kathyrn Drown, MD  trolamine salicylate (ASPERCREME) 10 % cream Apply 1 application topically 2 (two) times daily as needed for muscle pain.     [provider]    Allergies    Dyflex-g [dyphylline-guaifenesin] and Eliquis [apixaban]  Review of Systems   Review of Systems  Unable to perform ROS: Mental status change    Physical Exam Updated Vital Signs Ht 1.778 m (5' 10" )   Wt 114.8 kg   BMI 36.30 kg/m   Physical Exam Vitals and nursing note reviewed.  Constitutional:      General: He is not in acute distress.    Appearance: He is well-developed.  HENT:     Head: Normocephalic and atraumatic.     Mouth/Throat:     Mouth: Mucous membranes are dry.     Pharynx: No oropharyngeal exudate.  Eyes:     General: No scleral icterus.       Right eye: No discharge.        Left eye: No discharge.     Conjunctiva/sclera: Conjunctivae normal.       Pupils: Pupils are equal, round, and reactive to light.  Neck:     Thyroid: No thyromegaly.     Vascular: No JVD.  Cardiovascular:     Rate and Rhythm: Normal rate and regular rhythm.     Heart sounds: Murmur present. No friction rub. No gallop.   Pulmonary:     Effort: Pulmonary effort is normal. No respiratory distress.     Breath sounds: Normal breath sounds. No wheezing or rales.  Abdominal:     General: Bowel sounds are normal. There is no distension.     Palpations: Abdomen is soft. There is no mass.     Tenderness: There is no abdominal tenderness.  Musculoskeletal:        General: No tenderness. Normal range of motion.     Cervical back: Normal range of motion and neck supple.  Lymphadenopathy:     Cervical: No cervical adenopathy.  Skin:    General: Skin is warm and dry.     Findings: No erythema or rash.  Neurological:     Mental Status: He is alert.     Coordination: Coordination normal.     Comments: Severe diffuse weakness, the patient is barely able to stand and pivot over to the bed.  He has no edema of his legs, is able to lift both legs but weak, lift both arms but weak, speech is very weak.  No obvious facial droop  Psychiatric:        Behavior: Behavior normal.     ED Results / Procedures / Treatments   Labs (all labs ordered are listed, but only abnormal results are displayed) Labs Reviewed  URINE CULTURE  CBC WITH DIFFERENTIAL/PLATELET  COMPREHENSIVE METABOLIC PANEL  MAGNESIUM  URINALYSIS, ROUTINE W REFLEX MICROSCOPIC  RAPID URINE DRUG SCREEN, HOSP PERFORMED  ETHANOL  POC SARS CORONAVIRUS 2 AG -  ED  TROPONIN I (HIGH SENSITIVITY)    EKG None  Radiology No results found.  Procedures .Critical Care Performed by: Noemi Chapel, MD Authorized by: Noemi Chapel, MD   Critical care provider statement:    Critical care time (minutes):  75   Critical care time was exclusive of:  Separately billable procedures and treating other patients and  teaching time   Critical care was necessary to treat or prevent imminent or life-threatening deterioration of the following conditions:  Cardiac failure   Critical care was time spent personally by me on the following activities:  Blood draw for specimens, development of treatment plan with patient or surrogate, discussions with consultants, evaluation of patient's response to treatment, examination of patient, obtaining history from patient or surrogate, ordering and performing treatments and interventions, ordering and review of laboratory studies, ordering and review of radiographic studies, pulse oximetry, re-evaluation of patient's condition and review of old charts   (including critical care time)  Medications Ordered in ED Medications  aspirin chewable tablet 324 mg (has no administration in time range)    ED Course  I have reviewed the triage vital signs and the nursing notes.  Pertinent labs & imaging results that were available during my care of the patient were reviewed by me and considered in my medical decision making (see chart for details).  Clinical Course as of Aug 25 1450  Mon Aug 25, 2019  1737 Approximately 5 minutes ago the patient had another episode of ventricular tachycardia with resulted in syncope and likely shock.  He is slow to come around still complaining of shortness of breath.  He is borderline hypotensive, afebrile, heart rate is in the 60s, oxygen between 92 and 98%.  I have placed him on capnography as he does appear to becoming intermittently mildly apneic.  We will send Covid test, labs, waiting for cardiology to call me back, they have been paged   [BM]  1819 X-ray according to my interpretation shows that there is possibly some infiltrates in the lung which could be consistent with Covid.  The patient's potassium level came back less than 2, I have started both magnesium and potassium and contacted ICU critical care.   [BM]  [id number] Radiology is seen the  x-ray, agrees with multifocal pneumonia, comprehensive metabolic panel also confirms severe hypokalemia.  The patient is getting magnesium and potassium, multiple rounds.   [BM]    Clinical Course User Index [BM] Noemi Chapel, MD   MDM Rules/Calculators/A&P                     \ The patient's ejection fraction is 15 to 20%, he has severe congestive heart failure.  The patient has an implantable defibrillator which likely fired, will need to check.  Has [employer] pacemaker.    Medical notes reviewed, at approximately 315 today, 2 hours ago the patient had a syncopal episode, there was an episode of ventricular tachycardia.  The patient will most certainly need to be admitted to the hospital, he has had an unexplained run of ventricular  tachycardia with appropriate shock given.  Dr. Rayann Heman was notified and recommended as well that the patient be seen in the emergency department.  We will get labs, EKG has been reviewed and shows wide-complex rhythm, no obvious pacer spikes are being seen.  Patient had another episode of ventricular fibrillation, he was shocked, this occurred approximately 535.  I discussed the case with Dr. Acie Fredrickson, the patient is critically ill, I suspect that his electrolytes and severe dehydration is something to do with how sick the patient is.  I have asked for an i-STAT chemistry.  He has IV established, pacer pads are on, he has been recommended to be transferred down to [facility] however he may need to be on an intensive care unit service rather than a cardiology service, labs pending, recommended against amiodarone by cardiology service at this time.  I discussed the care with Dr. Vaughan Browner of the ICU service who is accepted care of the patient in transfer.  CRUZITO STANDRE was evaluated in Emergency Department on 08/25/2019 for the symptoms described in the history of present illness. He was evaluated in the context of the global COVID-19 pandemic, which  necessitated consideration that the patient might be at risk for infection with the SARS-CoV-2 virus that causes COVID-19. Institutional protocols and algorithms that pertain to the evaluation of patients at risk for COVID-19 are in a state of rapid change based on information released by regulatory bodies including the CDC and federal and state organizations. These policies and algorithms were followed during the patient's care in the ED.   Final Clinical Impression(s) / ED Diagnoses Final diagnoses:  Ventricular Tachycardia ([place])  Hypokalemia  Dehydration    Rx / DC Orders ED Discharge Orders    None       Noemi Chapel, MD 08/26/19 1451

## 2019-08-25 NOTE — H&P (Addendum)
NAME:  [name] [name], MRN:  [id number], DOB:  [birth year], LOS: 0 ADMISSION DATE:  08/25/2019, CONSULTATION DATE:  08/25/2019 REFERRING MD:  Vista Deck, CHIEF COMPLAINT:  Decompensated HF  Brief History   68yo male presented with severe fatigue, nausea and home defibrillation of ICD prior to admission.  Was seen with episode of V. tach at 1515 prior to admission with patient reported syncopal episode at home. He then again had another epiosde of V. Tach while in the ED with brief episode of apnea. Known history of ejection fraction of 15 to 20% with extensive cardiovascular history.  History of present illness   [name] [name] is a 68yo male with a past medical history significant for but not limited to diabetes, hypertension, hyperlipidemia, CAD (CABG x6 2001) VHD-post TAVR 2019, LBBB, paroxysmal atrial fibrillation, obesity, ICM, systolic congestive heart failure presence of ICD who presented with chief complaint of of severe fatigue, nausea, and questionable home defibrillation by his ICD.  Patient primary cardiology team notified of episode of V. tach approximately 1515 today at which time patient reports a syncopal episode.  Primary cardiologist office recommended patient to go to the ED for further evaluation.   At approximately 1735 patient experienced another episode of V. Tach  For which he was shocked per ED physician. Cardiology consulted who recommended transfer to [facility] for admission.   Patient is incidentally Covid positive who  complainis that he had felt poorly for about 2 days without fever chills cough or sputum production, and had been in bed for most of that time not diligent about taking his medications.  He felt his defibrillator go off and was found wedged between his nightstand and his bed.  He currently is awake alert oriented although says he feels weak.  His potassium at time of evaluation in any [place] was less than 2.0.  He is receiving 30 mEq of potassium. He seems to be very  diligent about his Lasix takes close to 100 mg a day in split doses along with Zaroxolyn.  I am not clear from talking with him whether or not he takes his spironolactone or any potassium replacement.  Patient also endorses frequent diarrhea and shortness of breath over the last several days. He currently is in atrial fibrillation which from chart review seems to be his baseline.  He has no complaints of chest pain.  He does take hydrocodone 10 mg 2-3 times a day.  Past Medical History  CAD s/p CABG x6 2001 S/p TAVR 2019 Paroxysmal A-fib OSA MI Morbid obesity Hyperlipidemia Type 2 diabetes  Systolic CHF EF 70-26% Ischemic cardiomyopathy  Presence of [place] [facility] Events   12/28 Admitted   Consults:  Cardiology   Procedures:    Significant Diagnostic Tests:  12/28 CXR R pleural effusion, atelectasis, bilateral interstitial edema.  AICD in place.  Sternal wires in place.  03/03/2019: TTE IMPRESSIONS 1. The left ventricle has a visually estimated ejection fraction of 15-20%. The cavity size was moderately dilated. Left ventricular diastolic Doppler parameters are indeterminate. 2. The right ventricle has moderately reduced systolic function. The cavity was moderately enlarged. There is no increase in right ventricular wall thickness. Right ventricular systolic pressure 37.8 mmHg. 3. Left atrial size was severely dilated. 4. Right atrial size was mildly dilated. 5. There is mild mitral annular calcification present. 6. A 63m Edwards Sapien bioprosthetic aortic valve (TAVR) valve is present in the aortic position. Procedure Date: 11/20/2017. Leaflet excursion appears normal. No prosthetic or  paravalvular regurgitation. Mean systolic gradient 8 mmHg. 7. The aortic root and ascending aorta are normal in size and structure. 8. The inferior vena cava was dilated in size with <50% respiratory variability. 9. No intracardiac thrombi or masses were visualized with  Definity contrast. 10. When compared to the prior study: 01/04/2018 - no significant change has occured. Aortic valve systolic Doppler gradient better visualized on today's exam.  Micro Data:  COVID 12/28 > negative   Antimicrobials:  N/A  Interim history/subjective:  NA   Objective   Blood pressure (!) 109/97, pulse 64, temperature (!) 97.4 F (36.3 C), temperature source Oral, resp. rate 16, height 5' 10" (1.778 m), weight 114.8 kg, SpO2 99 %.       No intake or output data in the 24 hours ending 08/25/19 1759 Filed Weights   08/25/19 1707  Weight: 114.8 kg    Examination: General: White male in no acute distress short of breath after speaking with couple paragraphs HENT: Within normal limits Lungs: Clear Cardiovascular: Irregularly irregular Abdomen: Benign Extremities: Trace edema Neuro: Nonfocal GU: N/A  Resolved Hospital Problem list     Assessment & Plan:  This is a 68 year old male with an extensive cardiac history including ischemic cardiomyopathy with an EF of 15 to 20% status post AICD and TAVR now with recurrent V. tach and resultant syncope.  AICD fired while he was at home and in the emergency department.  Re-occurant VT -2 episodes of VT 12/28 with syncopal/apneic episodes witnessed  Plan: Continuous telemetry  Follow Bmet  Pacer pads  Interrogation of ICD   Severe hypokalemia  Dehydration  -Potassium less than 2 on admission -likely secondary to excessive GI loss  Plan Aggressively replete  Monitor closely  Continuous telemetry  Gentle IV hydration   HX of Systolic CHF EF 77-93% ECHO 02/2019 HX ICM S/P TAVR 2019 - Plan: Cardiology consulted, appreciate assistance  Strict intake and output Daily weight  Monitor volume status closely  May require inotropic support   Anion gap acidosis  -In the setting of excessive GI lose Plan: IV hydration as above Trend   Diarrhea  -Patient reports several days history of diarrhea prior to  admission  Plan: Obtain GI panel Monitor and replete electrolytes as needed   Acute Kidney Injury  -in the setting of dehydration from GI lose. Baseline creatinine 1.1, creatinine on admission 1.28 P: Follow renal function / urine output Trend Bmet IV hydration Obtain urine lytes    Best practice:  Diet: Cardiac  Pain/Anxiety/Delirium protocol (if indicated): PRNs VAP protocol (if indicated): N/A DVT prophylaxis: SCD GI prophylaxis: PPI Glucose control: SSI Mobility: Bedrest  Code Status: Full code  Family Communication: Will update upon arrival  Disposition: ICU   Labs   CBC: Recent Labs  Lab 08/25/19 1708  WBC 5.7  NEUTROABS PENDING  HGB 14.7  HCT 45.2  MCV 96.8  PLT 903    Basic Metabolic Panel: No results for input(s): NA, K, CL, CO2, GLUCOSE, BUN, CREATININE, CALCIUM, MG, PHOS in the last 168 hours. GFR: Estimated Creatinine Clearance: 52.8 mL/min (A) (by C-G formula based on SCr of 1.7 mg/dL (H)). Recent Labs  Lab 08/25/19 1708  WBC 5.7    Liver Function Tests: No results for input(s): AST, ALT, ALKPHOS, BILITOT, PROT, ALBUMIN in the last 168 hours. No results for input(s): LIPASE, AMYLASE in the last 168 hours. No results for input(s): AMMONIA in the last 168 hours.  ABG    Component Value Date/Time  PHART 7.315 (L) 11/20/2017 1109   PCO2ART 46.3 11/20/2017 1109   PO2ART 93.0 11/20/2017 1109   HCO3 23.6 11/20/2017 1109   TCO2 29 11/20/2017 1258   ACIDBASEDEF 3.0 (H) 11/20/2017 1109   O2SAT 96.0 11/20/2017 1109     Coagulation Profile: No results for input(s): INR, PROTIME in the last 168 hours.  Cardiac Enzymes: No results for input(s): CKTOTAL, CKMB, CKMBINDEX, TROPONINI in the last 168 hours.  HbA1C: Hgb A1c MFr Bld  Date/Time Value Ref Range Status  06/09/2019 01:56 PM 8.8 (H) 4.8 - 5.6 % Final    Comment:             Prediabetes: 5.7 - 6.4          Diabetes: >6.4          Glycemic control for adults with diabetes: <7.0     03/03/2019 03:52 PM 13.0 (H) 4.8 - 5.6 % Final    Comment:             Prediabetes: 5.7 - 6.4          Diabetes: >6.4          Glycemic control for adults with diabetes: <7.0     CBG: No results for input(s): GLUCAP in the last 168 hours.  Review of Systems: Positive in bold   Gen: Denies fever, chills, weight change, fatigue, night sweats HEENT: Denies blurred vision, double vision, hearing loss, tinnitus, sinus congestion, rhinorrhea, sore throat, neck stiffness, dysphagia PULM: Denies shortness of breath, cough, sputum production, hemoptysis, wheezing CV: Denies chest pain, edema, orthopnea, paroxysmal nocturnal dyspnea, palpitations GI: Denies abdominal pain, nausea, vomiting, diarrhea, hematochezia, melena, constipation, change in bowel habits GU: Denies dysuria, hematuria, polyuria, oliguria, urethral discharge Endocrine: Denies hot or cold intolerance, polyuria, polyphagia or appetite change Derm: Denies rash, dry skin, scaling or peeling skin change Heme: Denies easy bruising, bleeding, bleeding gums Neuro: Denies headache, numbness, weakness, slurred speech, loss of memory or consciousness  Past Medical History  He,  has a past medical history of AICD (automatic cardioverter/defibrillator) present (05/18/2016), Anginal pain ([place]), Aortic stenosis, Arthritis, CAD (coronary artery disease), Cardiomyopathy, ischemic, CHF (congestive heart failure) ([place]), Chronic back pain, Diabetes mellitus without complication ([place]), Elevated PSA, History of kidney stones, Hyperlipidemia, Morbid obesity ([place]), Myocardial infarction ([place]), OSA (obstructive sleep apnea), Paroxysmal atrial fibrillation ([place]) (09/29/2018), S/P CABG x 6 (12/23/1999), and 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    Surgical History    Past Surgical History:  Procedure Laterality Date  . APPENDECTOMY    . BIOPSY  10/02/2018   Procedure: BIOPSY;  Surgeon: Daneil Dolin, MD;  [facility];  Service: Endoscopy;;  gastric  . CARDIAC CATHETERIZATION     stents  . CAROTID STENT     denies carotid stent  . CATARACT EXTRACTION, BILATERAL    . CORONARY ARTERY BYPASS GRAFT  12/23/1999   LIMA to LAD, SVG to ramus, sequential SVG to OM2-RPL, sequential SVG to AM-PDA  . EP IMPLANTABLE DEVICE N/A 05/18/2016   Procedure: ICD Implant;  Surgeon: Will Meredith Leeds, MD;  Location: [facility] CV LAB;  Service: Cardiovascular;  Laterality: N/A;  . ESOPHAGOGASTRODUODENOSCOPY (EGD) WITH PROPOFOL N/A 10/02/2018   Procedure: ESOPHAGOGASTRODUODENOSCOPY (EGD) WITH PROPOFOL;  Surgeon: Daneil Dolin, MD;  [facility];  Service: Endoscopy;  Laterality: N/A;  . EYE SURGERY    . ingrown toenail N/A   . LEFT HEART CATHETERIZATION WITH CORONARY/GRAFT ANGIOGRAM N/A 12/21/2014   Procedure:  LEFT HEART CATHETERIZATION WITH Beatrix Fetters;  Surgeon: Belva Crome, MD;  Location: [facility] CATH LAB;  Service: Cardiovascular;  Laterality: N/A;  . NASAL SEPTOPLASTY W/ TURBINOPLASTY Bilateral 04/25/2017   NASAL SEPTOPLASTY WITH TURBINATE REDUCTION/notes 04/25/2017  . NASAL SEPTOPLASTY W/ TURBINOPLASTY Bilateral 04/25/2017   Procedure: NASAL SEPTOPLASTY WITH TURBINATE REDUCTION;  Surgeon: Leta Baptist, MD;  Location: [place];  Service: ENT;  Laterality: Bilateral;  . RIGHT/LEFT HEART CATH AND CORONARY/GRAFT ANGIOGRAPHY N/A 10/05/2017   Procedure: RIGHT/LEFT HEART CATH AND CORONARY/GRAFT ANGIOGRAPHY;  Surgeon: Sherren Mocha, MD;  Location: [place] CV LAB;  Service: Cardiovascular;  Laterality: N/A;  . TEE WITHOUT CARDIOVERSION N/A 11/20/2017   Procedure: TRANSESOPHAGEAL ECHOCARDIOGRAM (TEE);  Surgeon: Sherren Mocha, MD;  Location: [place];  Service: Open Heart Surgery;  Laterality: N/A;  . TONSILLECTOMY    . TRANSCATHETER AORTIC VALVE REPLACEMENT, TRANSFEMORAL N/A 11/20/2017   Procedure: TRANSCATHETER AORTIC VALVE REPLACEMENT, TRANSFEMORAL;  Surgeon: Sherren Mocha, MD;  Location: [place];  Service:  Open Heart Surgery;  Laterality: N/A;      Social History   reports that he quit smoking about 20 years ago. He has never used smokeless tobacco. He reports that he does not drink alcohol or use drugs.    Family History   His family history includes CAD in his mother; Diabetes in his mother. There is no history of Colon cancer or Colon polyps. He was adopted.    Allergies Allergies  Allergen Reactions  . Dyflex-G [Dyphylline-Guaifenesin] Hives  . Eliquis [Apixaban] Other (See Comments)    Side effects not a true allergy- he stated that it made him feel bad      Home Medications  Prior to Admission medications   Medication Sig Start Date End Date Taking? Authorizing Provider  acetaminophen (TYLENOL) 500 MG tablet Take 1 tablet (500 mg total) by mouth 2 (two) times daily. Patient taking differently: Take 1,500 mg by mouth daily as needed for moderate pain.  10/29/18   Kathyrn Drown, MD  blood glucose meter kit and supplies KIT Dispense based on patient and insurance preference. Test  up to four times daily as directed.Dx: E11.9 06/30/19   Kathyrn Drown, MD  calcium carbonate (TUMS - DOSED IN MG ELEMENTAL CALCIUM) 500 MG chewable tablet Chew 2 tablets by mouth 2 (two) times daily as needed for indigestion or heartburn.     [provider]  carvedilol (COREG) 3.125 MG tablet TAKE (1) TABLET BY MOUTH TWICE DAILY WITH A MEAL. Patient taking differently: Take 3.125 mg by mouth 2 (two) times daily with a meal.  06/12/19   Luking, Elayne Snare, MD  ferrous sulfate 325 (65 FE) MG tablet Take 325 mg by mouth daily.    [provider]  FIBER PO Take 5 capsules by mouth daily.    [provider]  HYDROcodone-acetaminophen (NORCO) 10-325 MG tablet Take one tablet po twice daily prn pain Patient taking differently: Take 1 tablet by mouth 2 (two) times daily as needed for moderate pain.  07/23/19   Kathyrn Drown, MD  insulin aspart (NOVOLOG FLEXPEN) 100 UNIT/ML FlexPen  Inject 60 Units into the skin 2 (two) times daily with a meal. 04/21/19   Luking, Elayne Snare, MD  LEVEMIR FLEXTOUCH 100 UNIT/ML Pen INJECT 60 UNITS INTO THE SKIN AT BEDTIME. 07/14/19   Luking, Scott A, MD  Lidocaine HCl (LIDOCAINE PLUS) 4 % CREA Apply 1 application topically daily as needed (itching).    [provider]  linaclotide Rolan Lipa) 290  MCG CAPS capsule Take 1 capsule (290 mcg total) by mouth daily before breakfast. 12/24/18   Kathyrn Drown, MD  liver oil-zinc oxide (DESITIN) 40 % ointment Apply 1 application topically as needed for irritation.    [provider]  Menthol-Methyl Salicylate (GOODSENSE MUSCLE RUB) 8-30 % CREA Apply 1 application topically daily as needed (muscle pain).    [provider]  metolazone (ZAROXOLYN) 2.5 MG tablet TAKE ONE TABLET BY MOUTH DAILY. Patient taking differently: Take 2.5 mg by mouth See admin instructions. Take 2.5 mg on Friday, may take a second 2.5 mg dose during the week as needed for excessive fluid 09/02/18   Arnoldo Lenis, MD  Multiple Vitamins-Minerals (CENTRUM SILVER PO) Take 1 tablet by mouth daily.     [provider]  Na Sulfate-K Sulfate-Mg Sulf 17.5-3.13-1.6 GM/177ML SOLN Suprep-Use as directed 06/24/19   Willia Craze, NP  nitroGLYCERIN (NITROSTAT) 0.4 MG SL tablet PLACE 1 TABLET UNDER TONGUE FOR CHEST PAIN. MAY REPEAT EVERY 5 MIN UP TO 3 DOSES-NO RELIEF,CALL 911. Patient taking differently: Place 0.8 mg under the tongue every 5 (five) minutes as needed for chest pain.  07/09/19   Sherren Mocha, MD  oxymetazoline [facility] SINUS-MAX SINUS/ALLRGY) 0.05 % nasal spray Place 1 spray into both nostrils 2 (two) times daily as needed for congestion.    [provider]  pantoprazole (PROTONIX) 40 MG tablet Take 1 tablet (40 mg total) by mouth 2 (two) times daily. Patient taking differently: Take 40 mg by mouth 2 (two) times daily as needed (acid reflux).  10/09/18 10/09/19  Kathyrn Drown, MD    polycarbophil (FIBERCON) 625 MG tablet Take 2 tablets (1,250 mg total) by mouth daily with lunch. Hold if you have diarrhea 09/18/16   Rosita Fire, MD  Polyethyl Glycol-Propyl Glycol (SYSTANE) 0.4-0.3 % GEL ophthalmic gel Place 1 application into both eyes every other day.     [provider]  potassium chloride SA (KLOR-CON M20) 20 MEQ tablet Take 3 qam, 1 at lunch, and 3 at supper Patient taking differently: Take 40-60 mEq by mouth See admin instructions. Take 60 meq in the morning, 60 meq at afternoon, and 40 meq at supper time 06/12/19   Kathyrn Drown, MD  rivaroxaban (XARELTO) 20 MG TABS tablet Take 1 tablet (20 mg total) by mouth daily with supper. 05/23/19   Arnoldo Lenis, MD  rosuvastatin (CRESTOR) 40 MG tablet TAKE ONE TABLET BY MOUTH DAILY. Patient taking differently: Take 40 mg by mouth daily.  07/18/19   Kathyrn Drown, MD  spironolactone (ALDACTONE) 25 MG tablet TAKE 1 TABLET BY MOUTH ONCE A DAY. Patient taking differently: Take 25 mg by mouth every Friday.  04/07/19   Camnitz, Ocie Doyne, MD  SURE COMFORT PEN NEEDLES 31G X 8 MM MISC USING 4 TIMES DAILY. 04/07/19   Kathyrn Drown, MD  torsemide (DEMADEX) 20 MG tablet TAKE 80 MG IN THE MORNING & TAKE 60 MG IN THE EVENING. Patient taking differently: Take 60-80 mg by mouth See admin instructions. TAKE 80 MG IN THE MORNING & TAKE 60 MG IN THE EVENING. 06/26/19   Arnoldo Lenis, MD  traMADol (ULTRAM) 50 MG tablet TAKE 1 TABLET BY MOUTH EVERY 6 TO 8 HOURS AS NEEDED FOR HEADACHE. Patient taking differently: Take 50 mg by mouth every 8 (eight) hours as needed (headaches).  05/21/19   Kathyrn Drown, MD  trolamine salicylate (ASPERCREME) 10 % cream Apply 1 application topically 2 (two) times  daily as needed for muscle pain.     [provider]      Critical care time: Over 35 minutes was spent in chart review critical care planning and bedside evaluation

## 2019-08-25 NOTE — Telephone Encounter (Signed)
Patient had treated episode of VT today approximately at 1515 per the patient he had syncopal episode. Patient has had diarrhea and SOB for several days. Video visit has been scheduled for now with PCP. [place] and notified the RN of the treated VT and that patient should be evaluated in ED if he has symptoms of any kind. PCP will be informed of VT episode before call. Patient contacted and he reports he feel "terrible" and that he did not feel shock because he passed out and reports he wants to have visit with PCP .Patient encouraged to go to ED to be evaluated after visit with PCP even if PCP does not advise to do so due to being symptomatic after shock. Dr Rayann Heman (DOD) notified of patient condition and treatment by device and agrees with ED evaluation.

## 2019-08-25 NOTE — ED Notes (Signed)
Pt on 4 liters o2 via [place].  Pt had episode of vtach, did not go unresponsive.  Interrogated pacemaker.  Pt did not say if defibrillator shocked him again or not.  EPD at bedside.  Prior to vtach episode, pt had brief episode of apnea and unresponsiveness.  EDP was notified.   Capnography being monitored on pt at this time.

## 2019-08-25 NOTE — ED Notes (Signed)
Date and time results received: 08/25/19 8:20 PM  (use smartphrase ".now" to insert current time)  Test: covid  Critical Value: +  Name of Provider Notified: miller  Orders Received? Or Actions Taken?:

## 2019-08-26 DIAGNOSIS — I5043 Acute on chronic combined systolic (congestive) and diastolic (congestive) heart failure: Secondary | ICD-10-CM

## 2019-08-26 DIAGNOSIS — E876 Hypokalemia: Secondary | ICD-10-CM

## 2019-08-26 DIAGNOSIS — I209 Angina pectoris, unspecified: Secondary | ICD-10-CM

## 2019-08-26 DIAGNOSIS — I472 Ventricular tachycardia: Secondary | ICD-10-CM

## 2019-08-26 LAB — CBC
HCT: 41.2 % (ref 39.0–52.0)
Hemoglobin: 13.7 g/dL (ref 13.0–17.0)
MCH: 31.4 pg (ref 26.0–34.0)
MCHC: 33.3 g/dL (ref 30.0–36.0)
MCV: 94.3 fL (ref 80.0–100.0)
Platelets: 197 10*3/uL (ref 150–400)
RBC: 4.37 MIL/uL (ref 4.22–5.81)
RDW: 17.1 % — ABNORMAL HIGH (ref 11.5–15.5)
WBC: 6.4 10*3/uL (ref 4.0–10.5)
nRBC: 0 % (ref 0.0–0.2)

## 2019-08-26 LAB — GLUCOSE, CAPILLARY
Glucose-Capillary: 132 mg/dL — ABNORMAL HIGH (ref 70–99)
Glucose-Capillary: 142 mg/dL — ABNORMAL HIGH (ref 70–99)
Glucose-Capillary: 187 mg/dL — ABNORMAL HIGH (ref 70–99)
Glucose-Capillary: 233 mg/dL — ABNORMAL HIGH (ref 70–99)
Glucose-Capillary: 267 mg/dL — ABNORMAL HIGH (ref 70–99)

## 2019-08-26 LAB — BASIC METABOLIC PANEL
Anion gap: 13 (ref 5–15)
BUN: 31 mg/dL — ABNORMAL HIGH (ref 8–23)
CO2: 39 mmol/L — ABNORMAL HIGH (ref 22–32)
Calcium: 7.1 mg/dL — ABNORMAL LOW (ref 8.9–10.3)
Chloride: 83 mmol/L — ABNORMAL LOW (ref 98–111)
Creatinine, Ser: 1.37 mg/dL — ABNORMAL HIGH (ref 0.61–1.24)
GFR calc Af Amer: >60 mL/min (ref >60)
GFR calc non Af Amer: 53 mL/min — ABNORMAL LOW (ref >60)
Glucose, Bld: 229 mg/dL — ABNORMAL HIGH (ref 70–99)
Potassium: 2.8 mmol/L — ABNORMAL LOW (ref 3.5–5.1)
Sodium: 135 mmol/L (ref 135–145)

## 2019-08-26 LAB — LACTATE DEHYDROGENASE: LDH: 328 U/L — ABNORMAL HIGH (ref 98–192)

## 2019-08-26 LAB — RENAL FUNCTION PANEL
Albumin: 2.7 g/dL — ABNORMAL LOW (ref 3.5–5.0)
Anion gap: 22 — ABNORMAL HIGH (ref 5–15)
BUN: 33 mg/dL — ABNORMAL HIGH (ref 8–23)
CO2: 37 mmol/L — ABNORMAL HIGH (ref 22–32)
Calcium: 7.5 mg/dL — ABNORMAL LOW (ref 8.9–10.3)
Chloride: 78 mmol/L — ABNORMAL LOW (ref 98–111)
Creatinine, Ser: 1.46 mg/dL — ABNORMAL HIGH (ref 0.61–1.24)
GFR calc Af Amer: 56 mL/min — ABNORMAL LOW (ref >60)
GFR calc non Af Amer: 49 mL/min — ABNORMAL LOW (ref >60)
Glucose, Bld: 142 mg/dL — ABNORMAL HIGH (ref 70–99)
Phosphorus: 2.5 mg/dL (ref 2.5–4.6)
Potassium: 2.2 mmol/L — CL (ref 3.5–5.1)
Sodium: 137 mmol/L (ref 135–145)

## 2019-08-26 LAB — TROPONIN I (HIGH SENSITIVITY): Troponin I (High Sensitivity): 22 ng/L — ABNORMAL HIGH (ref <18)

## 2019-08-26 LAB — POTASSIUM
Potassium: 2.3 mmol/L — CL (ref 3.5–5.1)
Potassium: 2.5 mmol/L — CL (ref 3.5–5.1)

## 2019-08-26 LAB — HEMOGLOBIN A1C
Hgb A1c MFr Bld: 7.7 % — ABNORMAL HIGH (ref 4.8–5.6)
Mean Plasma Glucose: 174.29 mg/dL

## 2019-08-26 LAB — D-DIMER, QUANTITATIVE: D-Dimer, Quant: 0.78 ug/mL-FEU — ABNORMAL HIGH (ref 0.00–0.50)

## 2019-08-26 LAB — MAGNESIUM: Magnesium: 2.2 mg/dL (ref 1.7–2.4)

## 2019-08-26 LAB — C-REACTIVE PROTEIN: CRP: 6.7 mg/dL — ABNORMAL HIGH (ref <1.0)

## 2019-08-26 LAB — MRSA PCR SCREENING: MRSA by PCR: NEGATIVE

## 2019-08-26 LAB — FIBRINOGEN: Fibrinogen: 568 mg/dL — ABNORMAL HIGH (ref 210–475)

## 2019-08-26 LAB — PROCALCITONIN: Procalcitonin: 0.1 ng/mL

## 2019-08-26 MED ORDER — CHLORHEXIDINE GLUCONATE CLOTH 2 % EX PADS: 6.0000 | MEDICATED_PAD | Freq: Every day | CUTANEOUS | Status: DC

## 2019-08-26 MED ORDER — FENTANYL CITRATE (PF) 100 MCG/2ML IJ SOLN
50.0000 ug | Freq: Once | INTRAMUSCULAR | Status: AC
  Administered 2019-08-26: 50 ug via INTRAVENOUS
  Filled 2019-08-26: qty 2

## 2019-08-26 MED ORDER — RIVAROXABAN 20 MG PO TABS
20.0000 mg | ORAL_TABLET | Freq: Every day | ORAL | Status: DC
  Administered 2019-08-26 – 2019-08-27 (×2): 20 mg via ORAL
  Filled 2019-08-26 (×3): qty 1

## 2019-08-26 MED ORDER — SODIUM CHLORIDE 0.9 % IV SOLN
200.0000 mg | Freq: Once | INTRAVENOUS | Status: AC
  Administered 2019-08-26: 200 mg via INTRAVENOUS
  Filled 2019-08-26: qty 40

## 2019-08-26 MED ORDER — POTASSIUM CITRATE-CITRIC ACID 1100-334 MG/5ML PO SOLN
10.0000 meq | Freq: Three times a day (TID) | ORAL | Status: DC
  Filled 2019-08-26: qty 5

## 2019-08-26 MED ORDER — INSULIN DETEMIR 100 UNIT/ML ~~LOC~~ SOLN
20.0000 [IU] | Freq: Two times a day (BID) | SUBCUTANEOUS | Status: DC
  Administered 2019-08-26 – 2019-08-28 (×5): 20 [IU] via SUBCUTANEOUS
  Filled 2019-08-26 (×6): qty 0.2

## 2019-08-26 MED ORDER — POTASSIUM CHLORIDE CRYS ER 20 MEQ PO TBCR
40.0000 meq | EXTENDED_RELEASE_TABLET | ORAL | Status: AC
  Administered 2019-08-26 (×3): 40 meq via ORAL
  Filled 2019-08-26 (×3): qty 2

## 2019-08-26 MED ORDER — CARVEDILOL 3.125 MG PO TABS
3.1250 mg | ORAL_TABLET | Freq: Two times a day (BID) | ORAL | Status: DC
  Administered 2019-08-26 – 2019-08-28 (×4): 3.125 mg via ORAL
  Filled 2019-08-26 (×4): qty 1

## 2019-08-26 MED ORDER — SOD CITRATE-CITRIC ACID 500-334 MG/5ML PO SOLN
15.0000 mL | Freq: Three times a day (TID) | ORAL | Status: DC
  Administered 2019-08-26 – 2019-08-28 (×7): 15 mL via ORAL
  Filled 2019-08-26 (×11): qty 15

## 2019-08-26 MED ORDER — INSULIN ASPART 100 UNIT/ML ~~LOC~~ SOLN
0.0000 [IU] | SUBCUTANEOUS | Status: DC
  Administered 2019-08-26: 1 [IU] via SUBCUTANEOUS
  Administered 2019-08-26: 16:00:00 2 [IU] via SUBCUTANEOUS
  Administered 2019-08-26: 20:00:00 5 [IU] via SUBCUTANEOUS
  Administered 2019-08-26: 3 [IU] via SUBCUTANEOUS
  Administered 2019-08-26: 1 [IU] via SUBCUTANEOUS
  Administered 2019-08-27: 09:00:00 2 [IU] via SUBCUTANEOUS
  Administered 2019-08-27 (×2): 5 [IU] via SUBCUTANEOUS
  Administered 2019-08-27: 05:00:00 3 [IU] via SUBCUTANEOUS
  Administered 2019-08-27: 1 [IU] via SUBCUTANEOUS
  Administered 2019-08-28 (×3): 2 [IU] via SUBCUTANEOUS
  Administered 2019-08-28: 10:00:00 1 [IU] via SUBCUTANEOUS

## 2019-08-26 MED ORDER — POTASSIUM CHLORIDE CRYS ER 20 MEQ PO TBCR: 40.0000 meq | EXTENDED_RELEASE_TABLET | Freq: Two times a day (BID) | ORAL | Status: DC

## 2019-08-26 MED ORDER — ASPIRIN EC 81 MG PO TBEC
81.0000 mg | DELAYED_RELEASE_TABLET | Freq: Every day | ORAL | Status: DC
  Administered 2019-08-26 – 2019-08-28 (×3): 81 mg via ORAL
  Filled 2019-08-26 (×3): qty 1

## 2019-08-26 MED ORDER — POTASSIUM CHLORIDE 10 MEQ/100ML IV SOLN
10.0000 meq | INTRAVENOUS | Status: AC
  Administered 2019-08-26 (×2): 10 meq via INTRAVENOUS
  Filled 2019-08-26 (×2): qty 100

## 2019-08-26 MED ORDER — DEXAMETHASONE SODIUM PHOSPHATE 10 MG/ML IJ SOLN
6.0000 mg | INTRAMUSCULAR | Status: DC
  Administered 2019-08-26 – 2019-08-28 (×3): 6 mg via INTRAVENOUS
  Filled 2019-08-26 (×3): qty 1

## 2019-08-26 MED ORDER — POTASSIUM CHLORIDE 10 MEQ/100ML IV SOLN
10.0000 meq | INTRAVENOUS | Status: AC
  Administered 2019-08-26 (×2): 10 meq via INTRAVENOUS
  Filled 2019-08-26 (×3): qty 100

## 2019-08-26 MED ORDER — NITROGLYCERIN 0.4 MG SL SUBL
SUBLINGUAL_TABLET | SUBLINGUAL | Status: AC
  Administered 2019-08-26: 12:00:00 0.4 mg
  Filled 2019-08-26: qty 1

## 2019-08-26 MED ORDER — POTASSIUM CHLORIDE 10 MEQ/100ML IV SOLN
10.0000 meq | INTRAVENOUS | Status: AC
  Administered 2019-08-26 (×2): 10 meq via INTRAVENOUS
  Filled 2019-08-26 (×2): qty 100

## 2019-08-26 MED ORDER — FENTANYL CITRATE (PF) 100 MCG/2ML IJ SOLN: 100.0000 ug | Freq: Once | INTRAMUSCULAR | Status: DC

## 2019-08-26 MED ORDER — HYDROCODONE-ACETAMINOPHEN 10-325 MG PO TABS
1.0000 | ORAL_TABLET | Freq: Four times a day (QID) | ORAL | Status: DC | PRN
  Administered 2019-08-26 – 2019-08-27 (×3): 1 via ORAL
  Filled 2019-08-26 (×4): qty 1

## 2019-08-26 MED ORDER — ISOSORBIDE DINITRATE 5 MG PO TABS
5.0000 mg | ORAL_TABLET | Freq: Three times a day (TID) | ORAL | Status: DC
  Administered 2019-08-26 – 2019-08-28 (×6): 5 mg via ORAL
  Filled 2019-08-26 (×9): qty 1

## 2019-08-26 MED ORDER — SODIUM CHLORIDE 0.9 % IV SOLN
100.0000 mg | Freq: Every day | INTRAVENOUS | Status: DC
  Administered 2019-08-27 – 2019-08-28 (×2): 100 mg via INTRAVENOUS
  Filled 2019-08-26 (×2): qty 20

## 2019-08-26 MED ORDER — CHLORHEXIDINE GLUCONATE CLOTH 2 % EX PADS
6.0000 | MEDICATED_PAD | Freq: Every day | CUTANEOUS | Status: DC
  Administered 2019-08-26: 6 via TOPICAL

## 2019-08-26 MED ORDER — NITROGLYCERIN 0.4 MG SL SUBL
SUBLINGUAL_TABLET | SUBLINGUAL | Status: AC
  Administered 2019-08-26: 05:00:00 0.4 mg
  Filled 2019-08-26: qty 3

## 2019-08-26 MED ORDER — ROSUVASTATIN CALCIUM 20 MG PO TABS
40.0000 mg | ORAL_TABLET | Freq: Every day | ORAL | Status: DC
  Administered 2019-08-26 – 2019-08-27 (×2): 40 mg via ORAL
  Filled 2019-08-26: qty 2
  Filled 2019-08-26: qty 8

## 2019-08-26 MED ORDER — FENTANYL CITRATE (PF) 100 MCG/2ML IJ SOLN: 50.0000 ug | Freq: Once | INTRAMUSCULAR | Status: DC

## 2019-08-26 NOTE — Progress Notes (Addendum)
  Note ICD shock (appropriate) for VT in the VF zone with syncope.   This in the setting of acute illness including diarrhea (electrolyte depletion) with K on arrival <2.0 and COVID +.   Recommend aggressive electrolyte repletion and recheck. Discussed above with Dr. Rayann Heman.   In the setting of COVID+, we will follow from a distance at this time. EP will be available as needed. Please call with any questions.   Legrand Como [address] [place], PA-C  08/26/2019 8:39 AM

## 2019-08-26 NOTE — Progress Notes (Signed)
Upon arrival to unit, PT complaining of crushing chest pain. States he normally takes hydrocodone and nitroglycerin at home. 1 SL nitro given and EKG performed. MD notified. Dr. Mariane Masters at bedside to evaluate.

## 2019-08-26 NOTE — ED Notes (Signed)
Date and time results received: 08/26/19 0111 (use smartphrase ".now" to insert current time)  Test: Potassium Critical Value: 2.3  Name of Provider Notified: Roxanne Mins  Orders Received? Or Actions Taken?:

## 2019-08-26 NOTE — Telephone Encounter (Signed)
Needs to follow up with EP APP this week as I am out of the office.

## 2019-08-26 NOTE — Progress Notes (Signed)
[facility] Progress Note Patient Name: [name] [name] DOB: Apr 02, 1951 MRN: [id number]   Date of Service  08/26/2019  HPI/Events of Note  [facility]. He has a history of heart failure with reduced ejection fraction, AICD, TAVR 2019  eICU Interventions  New patient evaluation completed, PCCM ground crew requested to see him in consultation        Frederik Pear 08/26/2019, 4:16 AM

## 2019-08-26 NOTE — ED Notes (Signed)
Pt called out for assistance. Pt appeared to be sitting on edge of bed passed out. Nasal canula was removed from nose. Sats noted to be mid 80s on RA.Marland Kitchen Oxygen put back in nose and pt readjusted back in bed. Pt "woke up" cussing and yelling saying he couldn't breathe. Oxygen sats at this time were 97% on 4L [place]

## 2019-08-26 NOTE — Consult Note (Signed)
Cardiology Consultation:   Patient ID: [name] [name] MRN: [id number]; DOB: 24-Apr-1951  Admit date: 08/25/2019 Date of Consult: 08/26/2019  Primary Care Provider: Kathyrn Drown, MD Primary Cardiologist: [name] Dolly, MD  Primary Electrophysiologist:  [name] Haw, MD    Patient Profile:   [name] [name] is a 68 y.o. male with a [name] of DM, HTN, HLD, CAD with CABG X 6 2001, VHD,  s/p TAVR 2019, LBBB, PAF, obesity, ICM, chronic systolic HF and ICD who is being seen today for the evaluation of chest pain at the request of Dr. Mariane [name].  History of Present Illness:   [name] [name] and cardiac cath 10/05/17 prior to TAVR with severe three-vessel coronary artery disease with total occlusion of the RCA, total occlusion of the left circumflex, and total occlusion of the LAD just after the first diagonal; Status post aortocoronary bypass surgery with patent grafts including LIMA to LAD, saphenous vein graft to ramus intermedius, saphenous vein graft to second obtuse marginal, and saphenous vein graft to right PDA and Calcified, restricted aortic valve under plain fluoroscopy, with mean transvalvular gradient 27 mmHg.    Last Echo 02/2019 with EF 15-20%, cavity size moderately dilated, RV moderately reduced systolic function, cavity moderatly enlarged. Aortic valve was stable. (35m Edwards Sapien bioprosthetic aortic valve (TAVR) valve is present in the aortic position. Procedure Date: 11/20/2017. Leaflet excursion appears normal. No prosthetic or paravalvular regurgitation. Mean systolic gradient 8 mmHg.)   Pt did develop new LBBB post TAVR and though was perhaps upgrade to CRT device.  His volume was up in Oct and torsemide increased.  On Xarelto for PAF.  Now admitted 08/25/19 with severe fatigue, nausea and ICD discharge prior to admit.  + for V tach with syncope.  EP is aware and they are following from a distance.   Pt had been found to be COVID + as well - he has missed some  meds due to not feeling well.  [facility]  It has been replaced nbut still low at 2.2, Cr 1.46, Mg+ 2.2 .  Troponin  Hs, 19; 24;  22 LDH 328 WBC 6.4 and Hgb 13.7  plts 197   DDimer 0.78 fibrinogen 568      PCXR  Areas of airspace opacity in the right mid lung and both bases, more on the right than on the left, findings consistent with multifocal pneumonia  EKG:  The EKG was personally reviewed and demonstrates:  SR 1st degree AV block  with LBBB and occ PVCs no acute changes from 10/03/18  Telemetry:  Telemetry was personally reviewed and demonstrates:  SR --there is strip of V Tach  BP 115/63 p 82 R 21   Heart Pathway Score:     Past Medical History:  Diagnosis Date  . AICD (automatic cardioverter/defibrillator) present 05/18/2016  . Anginal pain ([facility]    occ none recent  . Aortic stenosis   . Arthritis   . CAD (coronary artery disease)   . Cardiomyopathy, ischemic   . CHF (congestive heart failure) ([facility]   . Chronic back pain   . Diabetes mellitus without complication ([facility]   . Elevated PSA   . History of kidney stones   . Hyperlipidemia   . Morbid obesity ([facility]   . Myocardial infarction ([facility] [place]   . OSA (obstructive sleep apnea)    does not wear CPAP, -order run out told need to redo  test  . Paroxysmal atrial fibrillation (  [place]) 09/29/2018   This was found on event monitor October 2019  . 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 APPENDECTOMY    . BIOPSY  10/02/2018   Procedure: BIOPSY;  Surgeon: [name] Dolin, MD;  [facility];  Service: Endoscopy;;  gastric  . CARDIAC CATHETERIZATION     stents  . CAROTID STENT     denies carotid stent  . CATARACT  EXTRACTION, BILATERAL    . CORONARY ARTERY BYPASS GRAFT  12/23/1999   LIMA to LAD, SVG to ramus, sequential SVG to OM2-RPL, sequential SVG to AM-PDA  . EP IMPLANTABLE DEVICE N/A 05/18/2016   Procedure: ICD Implant;  Surgeon: [name] Meredith Leeds, MD;  Location: [place] CV LAB;  Service: Cardiovascular;  Laterality: N/A;  . ESOPHAGOGASTRODUODENOSCOPY (EGD) WITH PROPOFOL N/A 10/02/2018   Procedure: ESOPHAGOGASTRODUODENOSCOPY (EGD) WITH PROPOFOL;  Surgeon: [name] Dolin, MD;  [facility];  Service: Endoscopy;  Laterality: N/A;  . EYE SURGERY    . ingrown toenail N/A   . LEFT HEART CATHETERIZATION WITH CORONARY/GRAFT ANGIOGRAM N/A 12/21/2014   Procedure: LEFT HEART CATHETERIZATION WITH [name] [name];  Surgeon: [name] Crome, MD;  Location: [facility] CATH LAB;  Service: Cardiovascular;  Laterality: N/A;  . NASAL SEPTOPLASTY W/ TURBINOPLASTY Bilateral 04/25/2017   NASAL SEPTOPLASTY WITH TURBINATE REDUCTION/notes 04/25/2017  . NASAL SEPTOPLASTY W/ TURBINOPLASTY Bilateral 04/25/2017   Procedure: NASAL SEPTOPLASTY WITH TURBINATE REDUCTION;  Surgeon: [name] Baptist, MD;  Location: [place];  Service: ENT;  Laterality: Bilateral;  . RIGHT/LEFT HEART CATH AND CORONARY/GRAFT ANGIOGRAPHY N/A 10/05/2017   Procedure: RIGHT/LEFT HEART CATH AND CORONARY/GRAFT ANGIOGRAPHY;  Surgeon: [name] Mocha, MD;  Location: [place] CV LAB;  Service: Cardiovascular;  Laterality: N/A;  . TEE WITHOUT CARDIOVERSION N/A 11/20/2017   Procedure: TRANSESOPHAGEAL ECHOCARDIOGRAM (TEE);  Surgeon: [name] Mocha, MD;  Location: [place];  Service: Open Heart Surgery;  Laterality: N/A;  . TONSILLECTOMY    . TRANSCATHETER AORTIC VALVE REPLACEMENT, TRANSFEMORAL N/A 11/20/2017   Procedure: TRANSCATHETER AORTIC VALVE REPLACEMENT, TRANSFEMORAL;  Surgeon: [name] Mocha, MD;  Location: [place];  Service: Open Heart Surgery;  Laterality: N/A;     Home Medications:  Prior to Admission medications   Medication Sig Start Date End Date  Taking? Authorizing Provider  acetaminophen (TYLENOL) 500 MG tablet Take 1 tablet (500 mg total) by mouth 2 (two) times daily. Patient taking differently: Take 1,500 mg by mouth daily as needed for moderate pain.  10/29/18   [name] Drown, MD  blood glucose meter kit and supplies KIT Dispense based on patient and insurance preference. Test  up to four times daily as directed.Dx: E11.9 06/30/19   [name] Drown, MD  calcium carbonate (TUMS - DOSED IN MG ELEMENTAL CALCIUM) 500 MG chewable tablet Chew 2 tablets by mouth 2 (two) times daily as needed for indigestion or heartburn.     [provider]  carvedilol (COREG) 3.125 MG tablet TAKE (1) TABLET BY MOUTH TWICE DAILY WITH A MEAL. Patient taking differently: Take 3.125 mg by mouth 2 (two) times daily with a meal.  06/12/19   Luking, Elayne Snare, MD  ferrous sulfate 325 (65 FE) MG tablet Take 325 mg by mouth daily.  [provider]  FIBER PO Take 5 capsules by mouth daily.    [provider]  HYDROcodone-acetaminophen (NORCO) 10-325 MG tablet Take one tablet po twice daily prn pain Patient taking differently: Take 1 tablet by mouth 2 (two) times daily as needed for moderate pain.  07/23/19   [name] Drown, MD  insulin aspart (NOVOLOG FLEXPEN) 100 UNIT/ML FlexPen Inject 60 Units into the skin 2 (two) times daily with a meal. 04/21/19   Luking, Elayne Snare, MD  LEVEMIR FLEXTOUCH 100 UNIT/ML Pen INJECT 60 UNITS INTO THE SKIN AT BEDTIME. 07/14/19   Luking, Scott A, MD  Lidocaine HCl (LIDOCAINE PLUS) 4 % CREA Apply 1 application topically daily as needed (itching).    [provider]  linaclotide Rolan Lipa) 290 MCG CAPS capsule Take 1 capsule (290 mcg total) by mouth daily before breakfast. 12/24/18   [name] Drown, MD  liver oil-zinc oxide (DESITIN) 40 % ointment Apply 1 application topically as needed for irritation.    [provider]  Menthol-Methyl Salicylate (GOODSENSE MUSCLE RUB) 8-30 % CREA Apply 1 application  topically daily as needed (muscle pain).    [provider]  metolazone (ZAROXOLYN) 2.5 MG tablet TAKE ONE TABLET BY MOUTH DAILY. Patient taking differently: Take 2.5 mg by mouth See admin instructions. Take 2.5 mg on Friday, may take a second 2.5 mg dose during the week as needed for excessive fluid 09/02/18   Arnoldo Lenis, MD  Multiple Vitamins-Minerals (CENTRUM SILVER PO) Take 1 tablet by mouth daily.     [provider]  Na Sulfate-K Sulfate-Mg Sulf 17.5-3.13-1.6 GM/177ML SOLN Suprep-Use as directed 06/24/19   Willia Craze, NP  nitroGLYCERIN (NITROSTAT) 0.4 MG SL tablet PLACE 1 TABLET UNDER TONGUE FOR CHEST PAIN. MAY REPEAT EVERY 5 MIN UP TO 3 DOSES-NO RELIEF,CALL 911. Patient taking differently: Place 0.8 mg under the tongue every 5 (five) minutes as needed for chest pain.  07/09/19   [name] Mocha, MD  oxymetazoline [facility] SINUS-MAX SINUS/ALLRGY) 0.05 % nasal spray Place 1 spray into both nostrils 2 (two) times daily as needed for congestion.    [provider]  pantoprazole (PROTONIX) 40 MG tablet Take 1 tablet (40 mg total) by mouth 2 (two) times daily. Patient taking differently: Take 40 mg by mouth 2 (two) times daily as needed (acid reflux).  10/09/18 10/09/19  [name] Drown, MD  polycarbophil (FIBERCON) 625 MG tablet Take 2 tablets (1,250 mg total) by mouth daily with lunch. Hold if you have diarrhea 09/18/16   Rosita Fire, MD  Polyethyl Glycol-Propyl Glycol (SYSTANE) 0.4-0.3 % GEL ophthalmic gel Place 1 application into both eyes every other day.     [provider]  potassium chloride SA (KLOR-CON M20) 20 MEQ tablet Take 3 qam, 1 at lunch, and 3 at supper Patient taking differently: Take 40-60 mEq by mouth See admin instructions. Take 60 meq in the morning, 60 meq at afternoon, and 40 meq at supper time 06/12/19   [name] Drown, MD  rivaroxaban (XARELTO) 20 MG TABS tablet Take 1 tablet (20 mg total) by mouth daily with supper.  05/23/19   Arnoldo Lenis, MD  rosuvastatin (CRESTOR) 40 MG tablet TAKE ONE TABLET BY MOUTH DAILY. Patient taking differently: Take 40 mg by mouth daily.  07/18/19   [name] Drown, MD  spironolactone (ALDACTONE) 25 MG tablet TAKE 1 TABLET BY MOUTH ONCE A DAY. Patient taking differently: Take 25 mg by mouth every Friday.  04/07/19  Camnitz, Ocie Doyne, MD  SURE COMFORT PEN NEEDLES 31G X 8 MM MISC USING 4 TIMES DAILY. 04/07/19   [name] Drown, MD  torsemide (DEMADEX) 20 MG tablet TAKE 80 MG IN THE MORNING & TAKE 60 MG IN THE EVENING. Patient taking differently: Take 60-80 mg by mouth See admin instructions. TAKE 80 MG IN THE MORNING & TAKE 60 MG IN THE EVENING. 06/26/19   Arnoldo Lenis, MD  traMADol (ULTRAM) 50 MG tablet TAKE 1 TABLET BY MOUTH EVERY 6 TO 8 HOURS AS NEEDED FOR HEADACHE. Patient taking differently: Take 50 mg by mouth every 8 (eight) hours as needed (headaches).  05/21/19   [name] Drown, MD  trolamine salicylate (ASPERCREME) 10 % cream Apply 1 application topically 2 (two) times daily as needed for muscle pain.     [provider]    Inpatient Medications: Scheduled Meds: . carvedilol  3.125 mg Oral BID WC  . Chlorhexidine Gluconate Cloth  6 each Topical Daily  . dexamethasone (DECADRON) injection  6 mg Intravenous Q24H  . insulin aspart  0-9 Units Subcutaneous Q4H  . insulin detemir  20 Units Subcutaneous BID  . potassium chloride  40 mEq Oral Q4H  . rivaroxaban  20 mg Oral Q supper  . rosuvastatin  40 mg Oral q1800  . sodium citrate-citric acid  15 mL Oral TID   Continuous Infusions: . remdesivir 200 mg in sodium chloride 0.9% 250 mL IVPB     Followed by  . [START ON 08/27/2019] remdesivir 100 mg in NS 100 mL     PRN Meds: HYDROcodone-acetaminophen  Allergies:    Allergies  Allergen Reactions  . Dyflex-G [Dyphylline-Guaifenesin] Hives  . Eliquis [Apixaban] Other (See Comments)    Side effects not a true allergy- he stated that it made him  feel bad    Social History:   Social History   Socioeconomic History  . Marital status: Married    Spouse name: Not on file  . Number of children: Not on file  . Years of education: Not on file  . Highest education level: Not on file  Occupational History  . Occupation: retired    Comment: [employer]    Comment: [employer]  Tobacco Use  . Smoking status: Former Smoker    Quit date: 08/29/1999    Years since quitting: 20.0  . Smokeless tobacco: Never Used  . Tobacco comment: couldn't remember date  Substance and Sexual Activity  . Alcohol use: No    Alcohol/week: 0.0 standard drinks  . Drug use: No  . Sexual activity: Not Currently  Other Topics Concern  . Not on file  Social History Narrative  . Not on file   Social Determinants of Health   Financial Resource Strain: Unknown  . Difficulty of Paying Living Expenses: Patient refused  Food Insecurity: Unknown  . Worried About Charity fundraiser in the Last Year: Patient refused  . Ran Out of Food in the Last Year: Patient refused  Transportation Needs: Unknown  . Lack of Transportation (Medical): Patient refused  . Lack of Transportation (Non-Medical): Patient refused  Physical Activity: Unknown  . Days of Exercise per Week: Patient refused  . Minutes of Exercise per Session: Patient refused  Stress: Unknown  . Feeling of Stress : Patient refused  Social Connections: Unknown  . Frequency of Communication with Friends and Family: Patient refused  . Frequency of Social Gatherings with Friends and Family: Patient refused  . Attends Religious Services: Patient  refused  . Active Member of Clubs or Organizations: Patient refused  . Attends Archivist Meetings: Patient refused  . Marital Status: Patient refused  Intimate Partner Violence: Unknown  . Fear of Current or Ex-Partner: Patient refused  . Emotionally Abused: Patient refused  . Physically Abused: Patient refused  . Sexually Abused: Patient  refused    Family History:    Family History  Adopted: Yes  Problem Relation Age of Onset  . Diabetes Mother   . CAD Mother   . Colon cancer Neg [name]   . Colon polyps Neg [name]      ROS:  Please see the history of present illness.  General:+ COVID no fevers, no weight changes, severe fatigue and nausea  Skin:no rashes or ulcers HEENT:no blurred vision, no congestion CV:see HPI PUL:see HPI GI:no diarrhea constipation or melena, no indigestion + nausea GU:no hematuria, no dysuria MS:no joint pain, no claudication Neuro:no syncope, no lightheadedness Endo:no diabetes, no thyroid disease  All other ROS reviewed and negative.     Physical Exam/Data:   Vitals:   08/26/19 1033 08/26/19 1100 08/26/19 1200 08/26/19 1205  BP:  (!) 117/97  120/66  Pulse: 77 72 84 77  Resp: (!) 21 19 14 19   Temp:    98 F (36.7 C)  TempSrc:      SpO2: 91% 91% 94% 93%  Weight:      Height:        Intake/Output Summary (Last 24 hours) at 08/26/2019 1411 Last data filed at 08/26/2019 1300 Gross per 24 hour  Intake 419.22 ml  Output 600 ml  Net -180.78 ml   Last 3 Weights 08/25/2019 06/30/2019 06/26/2019  Weight (lbs) 253 lb 283 lb 285 lb  Weight (kg) 114.76 kg 128.368 kg 129.275 kg     Body mass index is 36.3 kg/m.  EXAM per Dr. Johnsie Cancel  Relevant CV Studies: Echo 03/03/19 IMPRESSIONS    1. The left ventricle has a visually estimated ejection fraction of 15-20%. The cavity size was moderately dilated. Left ventricular diastolic Doppler parameters are indeterminate.  2. The right ventricle has moderately reduced systolic function. The cavity was moderately enlarged. There is no increase in right ventricular wall thickness. Right ventricular systolic pressure 93.8 mmHg.  3. Left atrial size was severely dilated.  4. Right atrial size was mildly dilated.  5. There is mild mitral annular calcification present.  6. A 29m Edwards Sapien bioprosthetic aortic valve (TAVR) valve is present in the  aortic position. Procedure Date: 11/20/2017. Leaflet excursion appears normal. No prosthetic or paravalvular regurgitation. Mean systolic gradient 8 mmHg.  7. The aortic root and ascending aorta are normal in size and structure.  8. The inferior vena cava was dilated in size with <50% respiratory variability.  9. No intracardiac thrombi or masses were visualized with Definity contrast. 10. When compared to the prior study: 01/04/2018 - no significant change has occured. Aortic valve systolic Doppler gradient better visualized on today's exam.  FINDINGS  Left Ventricle: The left ventricle has a visually estimated ejection fraction of 15-20. The cavity size was moderately dilated. There is no increase in left ventricular wall thickness. Left ventricular diastolic Doppler parameters are indeterminate.  Definity contrast agent was given IV to delineate the left ventricular endocardial borders.  Right Ventricle: The right ventricle has moderately reduced systolic function. The cavity was moderately enlarged. There is no increase in right ventricular wall thickness. Right ventricular systolic pressure 418.2mmHg. Pacing wire/catheter visualized in  the right  ventricle.  Left Atrium: Left atrial size was severely dilated.  Right Atrium: Right atrial size was mildly dilated.  Interatrial Septum: No atrial level shunt detected by color flow Doppler.  Pericardium: There is no evidence of pericardial effusion.  Mitral Valve: The mitral valve is normal in structure. There is mild mitral annular calcification present. Mitral valve regurgitation is trivial by color flow Doppler.  Tricuspid Valve: The tricuspid valve is normal in structure. Tricuspid valve regurgitation is mild by color flow Doppler.  Aortic Valve: The aortic valve has been repaired/replaced Aortic valve regurgitation was not visualized by color flow Doppler. A 51m Edwards Sapien bioprosthetic, stented aortic valve (TAVR) valve is  present in the aortic position. Procedure Date:  11/20/2017.  Pulmonic Valve: The pulmonic valve was normal in structure. Pulmonic valve regurgitation is trivial by color flow Doppler.  Aorta: The aortic root and ascending aorta are normal in size and structure.  Venous: The inferior vena cava is dilated in size with less than 50% respiratory variability.  Compared to previous exam: 01/04/2018 - no significant change has occured. Aortic valve systolic Doppler gradient better visualized on today's exam.   10/05/17 rt and lt cardiac cath  1.  Severe three-vessel coronary artery disease with total occlusion of the RCA, total occlusion of the left circumflex, and total occlusion of the LAD just after the first diagonal 2.  Status post aortocoronary bypass surgery with patent grafts including LIMA to LAD, saphenous vein graft to ramus intermedius, saphenous vein graft to second obtuse marginal, and saphenous vein graft to right PDA 3.  Calcified, restricted aortic valve under plain fluoroscopy, with mean transvalvular gradient 27 mmHg  Recommendation continued evaluation for progressive aortic stenosis suspicious for low flow low gradient aortic stenosis, CT angiography of the heart and peripheral vessels [name] be ordered and multidisciplinary evaluation by cardiac surgery.   Laboratory Data:  High Sensitivity Troponin:   Recent Labs  Lab 08/25/19 1708 08/25/19 2016 08/26/19 0754  TROPONINIHS 19* 24* 22*     Chemistry Recent Labs  Lab 08/25/19 1708 08/26/19 0032 08/26/19 0754  NA 135  --  137  K <2.0* 2.3* 2.2*  CL 74*  --  78*  CO2 41*  --  37*  GLUCOSE 178*  --  142*  BUN 33*  --  33*  CREATININE 1.28*  --  1.46*  CALCIUM 7.4*  --  7.5*  GFRNONAA 57*  --  49*  GFRAA >60  --  56*  ANIONGAP 20*  --  22*    Recent Labs  Lab 08/25/19 1708 08/26/19 0754  PROT 7.5  --   ALBUMIN 3.1* 2.7*  AST 40  --   ALT 16  --   ALKPHOS 91  --   BILITOT 3.7*  --     Hematology Recent Labs  Lab 08/25/19 1708 08/26/19 0754  WBC 5.7 6.4  RBC 4.67 4.37  HGB 14.7 13.7  HCT 45.2 41.2  MCV 96.8 94.3  MCH 31.5 31.4  MCHC 32.5 33.3  RDW 17.0* 17.1*  PLT 194 197   BNPNo results for input(s): BNP, PROBNP in the last 168 hours.  DDimer  Recent Labs  Lab 08/26/19 1111  DDIMER 0.78*     Radiology/Studies:  DG Chest Port 1 View  Result Date: 08/25/2019 CLINICAL DATA:  Chest pain and shortness of breath EXAM: PORTABLE CHEST 1 VIEW COMPARISON:  November 20, 2017 FINDINGS: There is airspace consolidation throughout portions of the right mid and lower lung zones as  well as in the left base. There is cardiomegaly. Pulmonary vascularity is within normal limits. Pacemaker lead is attached to the right ventricle. Patient is status post coronary artery bypass grafting. Status post aortic valve replacement. No adenopathy. No bone lesions. IMPRESSION: Areas of airspace opacity in the right mid lung and both bases, more on the right than on the left, findings consistent with multifocal pneumonia. Cardiomegaly. Pacemaker lead attached to right ventricle. Status post coronary artery bypass grafting. Aortic valve replacement noted. No adenopathy demonstrable. Electronically Signed   By: Lowella Grip III M.D.   On: 08/25/2019 18:26       HEAR Score (for undifferentiated chest pain):     5  Assessment and Plan:   1. Chest pain Dr Johnsie Cancel to examine.  Pt presented with 2 days of not feeling well due to COVID, he developed VT and syncope and his device did discharge to SR.  His troponin is 24 at peak, last cath in 09/2017 with patent grafts.  With ICD shock and Covid PNA this may be cause of chest pain.   2. PAF maintaining SR except for VT.  On xarelto for CHA2DS2VASc of 5.  3. Hypokalemia and being replaced and Mg+ given. 4. V. Tach sustained with syncope and shock  EP aware and following  5.  CAD with CABG and patent grafts 09/2017 rec'd one dose of 324 ASA  6.    ICM with EF 15-20% and ICD.  Chronic systolic HF  7. AS with TAVR 2019 and LBBB since TAVR, may need up grade of ICD to CRT/ICD   8. COVID per primary team on remdesivir and decadron  9. HLD on crestor       For questions or updates, please contact [place] Please consult [email or web address] for contact info under     Signed, Cecilie Kicks, NP  08/26/2019 2:11 PM

## 2019-08-26 NOTE — H&P (Deleted)
NAME:  [name] [name], MRN:  [id number], DOB:  December 05, 1950, LOS: 1 ADMISSION DATE:  08/25/2019, CONSULTATION DATE:  08/25/2019 REFERRING MD:  Vista Deck, CHIEF COMPLAINT:  Decompensated HF  Brief History   68yo male presented with severe fatigue, nausea and home defibrillation of ICD prior to admission.  Was seen with episode of V. tach at 1515 prior to admission with patient reported syncopal episode at home. He then again had another epiosde of V. Tach while in the ED with brief episode of apnea. Known history of ejection fraction of 15 to 20% with extensive cardiovascular history.  History of present illness   [name] [name] is a 68yo male with a past medical history significant for but not limited to diabetes, hypertension, hyperlipidemia, CAD (CABG x6 2001) VHD-post TAVR 2019, LBBB, paroxysmal atrial fibrillation, obesity, ICM, systolic congestive heart failure presence of ICD who presented with chief complaint of of severe fatigue, nausea, and questionable home defibrillation by his ICD.  Patient primary cardiology team notified of episode of V. tach approximately 1515 today at which time patient reports a syncopal episode.  Primary cardiologist office recommended patient to go to the ED for further evaluation.   At approximately 1735 patient experienced another episode of V. Tach  For which he was shocked per ED physician. Cardiology consulted who recommended transfer to [facility] for admission.   Patient is incidentally Covid positive who  complainis that he had felt poorly for about 2 days without fever chills cough or sputum production, and had been in bed for most of that time not diligent about taking his medications.  He felt his defibrillator go off and was found wedged between his nightstand and his bed.  He currently is awake alert oriented although says he feels weak.  His potassium at time of evaluation in any [facility] was less than 2.0.  He is receiving 30 mEq of potassium. He seems to be very  diligent about his Lasix takes close to 100 mg a day in split doses along with Zaroxolyn.  I am not clear from talking with him whether or not he takes his spironolactone or any potassium replacement.  Patient also endorses frequent diarrhea and shortness of breath over the last several days. He currently is in atrial fibrillation which from chart review seems to be his baseline.  He has no complaints of chest pain.  He does take hydrocodone 10 mg 2-3 times a day.  Past Medical History  CAD s/p CABG x6 2001 S/p TAVR 2019 Paroxysmal A-fib OSA MI Morbid obesity Hyperlipidemia Type 2 diabetes  Systolic CHF EF 40-98% Ischemic cardiomyopathy  Presence of [facility]   12/28 Admitted   Consults:  Cardiology   Procedures:    Significant Diagnostic Tests:  12/28 CXR R pleural effusion, atelectasis, bilateral interstitial edema.  AICD in place.  Sternal wires in place.  03/03/2019: TTE IMPRESSIONS 1. The left ventricle has a visually estimated ejection fraction of 15-20%. The cavity size was moderately dilated. Left ventricular diastolic Doppler parameters are indeterminate. 2. The right ventricle has moderately reduced systolic function. The cavity was moderately enlarged. There is no increase in right ventricular wall thickness. Right ventricular systolic pressure 11.9 mmHg. 3. Left atrial size was severely dilated. 4. Right atrial size was mildly dilated. 5. There is mild mitral annular calcification present. 6. A 2m Edwards Sapien bioprosthetic aortic valve (TAVR) valve is present in the aortic position. Procedure Date: 11/20/2017. Leaflet excursion appears normal. No prosthetic or  paravalvular regurgitation. Mean systolic gradient 8 mmHg. 7. The aortic root and ascending aorta are normal in size and structure. 8. The inferior vena cava was dilated in size with <50% respiratory variability. 9. No intracardiac thrombi or masses were visualized with  Definity contrast. 10. When compared to the prior study: 01/04/2018 - no significant change has occured. Aortic valve systolic Doppler gradient better visualized on today's exam.  Micro Data:  COVID 12/28 > negative   Antimicrobials:  N/A  Interim history/subjective:  NA   Objective   Blood pressure (!) 108/97, pulse 73, temperature (!) 97.4 F (36.3 C), temperature source Oral, resp. rate 19, height 5' 10"  (1.778 m), weight 114.8 kg, SpO2 95 %.        Intake/Output Summary (Last 24 hours) at 08/26/2019 0515 Last data filed at 08/26/2019 0500 Gross per 24 hour  Intake 258.36 ml  Output --  Net 258.36 ml   Filed Weights   08/25/19 1707  Weight: 114.8 kg    Examination: General: White male in no acute distress short of breath after speaking with couple paragraphs HENT: Within normal limits Lungs: Clear Cardiovascular: Irregularly irregular Abdomen: Benign Extremities: Trace edema Neuro: Nonfocal GU: N/A  Resolved Hospital Problem list     Assessment & Plan:  This is a 68 year old male with an extensive cardiac history including ischemic cardiomyopathy with an EF of 15 to 20% status post AICD and TAVR now with recurrent V. tach and resultant syncope.  AICD fired while he was at home and in the emergency department.  Re-occurant VT -2 episodes of VT 12/28 with syncopal/apneic episodes witnessed  Plan: Continuous telemetry  Follow Bmet  Pacer pads  Interrogation of ICD   Severe hypokalemia  Dehydration  -Potassium less than 2 on admission -likely secondary to excessive GI loss  Plan Aggressively replete  Monitor closely  Continuous telemetry  Gentle IV hydration   HX of Systolic CHF EF 96-04% ECHO 02/2019 HX ICM S/P TAVR 2019 - Plan: Cardiology consulted, appreciate assistance  Strict intake and output Daily weight  Monitor volume status closely  May require inotropic support   Anion gap acidosis  -In the setting of excessive GI lose Plan: IV  hydration as above Trend   Diarrhea  -Patient reports several days history of diarrhea prior to admission  Plan: Obtain GI panel Monitor and replete electrolytes as needed   Acute Kidney Injury  -in the setting of dehydration from GI lose. Baseline creatinine 1.1, creatinine on admission 1.28 P: Follow renal function / urine output Trend Bmet IV hydration Obtain urine lytes    Best practice:  Diet: Cardiac  Pain/Anxiety/Delirium protocol (if indicated): PRNs VAP protocol (if indicated): N/A DVT prophylaxis: SCD GI prophylaxis: PPI Glucose control: SSI Mobility: Bedrest  Code Status: Full code  Family Communication: Will update upon arrival  Disposition: ICU   Labs   CBC: Recent Labs  Lab 08/25/19 1708  WBC 5.7  NEUTROABS 4.7  HGB 14.7  HCT 45.2  MCV 96.8  PLT 540    Basic Metabolic Panel: Recent Labs  Lab 08/25/19 1708 08/26/19 0032  NA 135  --   K <2.0* 2.3*  CL 74*  --   CO2 41*  --   GLUCOSE 178*  --   BUN 33*  --   CREATININE 1.28*  --   CALCIUM 7.4*  --   MG 1.9  --    GFR: Estimated Creatinine Clearance: 70.1 mL/min (A) (by C-G formula based on SCr of 1.28  mg/dL (H)). Recent Labs  Lab 08/25/19 1708  WBC 5.7    Liver Function Tests: Recent Labs  Lab 08/25/19 1708  AST 40  ALT 16  ALKPHOS 91  BILITOT 3.7*  PROT 7.5  ALBUMIN 3.1*   No results for input(s): LIPASE, AMYLASE in the last 168 hours. No results for input(s): AMMONIA in the last 168 hours.  ABG    Component Value Date/Time   PHART 7.315 (L) 11/20/2017 1109   PCO2ART 46.3 11/20/2017 1109   PO2ART 93.0 11/20/2017 1109   HCO3 23.6 11/20/2017 1109   TCO2 29 11/20/2017 1258   ACIDBASEDEF 3.0 (H) 11/20/2017 1109   O2SAT 96.0 11/20/2017 1109     Coagulation Profile: No results for input(s): INR, PROTIME in the last 168 hours.  Cardiac Enzymes: No results for input(s): CKTOTAL, CKMB, CKMBINDEX, TROPONINI in the last 168 hours.  HbA1C: Hgb A1c MFr Bld  Date/Time  Value Ref Range Status  06/09/2019 01:56 PM 8.8 (H) 4.8 - 5.6 % Final    Comment:             Prediabetes: 5.7 - 6.4          Diabetes: >6.4          Glycemic control for adults with diabetes: <7.0   03/03/2019 03:52 PM 13.0 (H) 4.8 - 5.6 % Final    Comment:             Prediabetes: 5.7 - 6.4          Diabetes: >6.4          Glycemic control for adults with diabetes: <7.0     CBG: No results for input(s): GLUCAP in the last 168 hours.  Review of Systems: Positive in bold   Gen: Denies fever, chills, weight change, fatigue, night sweats HEENT: Denies blurred vision, double vision, hearing loss, tinnitus, sinus congestion, rhinorrhea, sore throat, neck stiffness, dysphagia PULM: Denies shortness of breath, cough, sputum production, hemoptysis, wheezing CV: Denies chest pain, edema, orthopnea, paroxysmal nocturnal dyspnea, palpitations GI: Denies abdominal pain, nausea, vomiting, diarrhea, hematochezia, melena, constipation, change in bowel habits GU: Denies dysuria, hematuria, polyuria, oliguria, urethral discharge Endocrine: Denies hot or cold intolerance, polyuria, polyphagia or appetite change Derm: Denies rash, dry skin, scaling or peeling skin change Heme: Denies easy bruising, bleeding, bleeding gums Neuro: Denies headache, numbness, weakness, slurred speech, loss of memory or consciousness  Past Medical History  He,  has a past medical history of AICD (automatic cardioverter/defibrillator) present (05/18/2016), Anginal pain ([place]), Aortic stenosis, Arthritis, CAD (coronary artery disease), Cardiomyopathy, ischemic, CHF (congestive heart failure) ([place]), Chronic back pain, Diabetes mellitus without complication ([place]), Elevated PSA, History of kidney stones, Hyperlipidemia, Morbid obesity ([place]), Myocardial infarction ([place]), OSA (obstructive sleep apnea), Paroxysmal atrial fibrillation ([place]) (09/29/2018), S/P CABG x 6 (12/23/1999), and 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    Surgical History    Past Surgical History:  Procedure Laterality Date  . APPENDECTOMY    . BIOPSY  10/02/2018   Procedure: BIOPSY;  Surgeon: Daneil Dolin, MD;  [facility];  Service: Endoscopy;;  gastric  . CARDIAC CATHETERIZATION     stents  . CAROTID STENT     denies carotid stent  . CATARACT EXTRACTION, BILATERAL    . CORONARY ARTERY BYPASS GRAFT  12/23/1999   LIMA to LAD, SVG to ramus, sequential SVG to OM2-RPL, sequential SVG to AM-PDA  . EP IMPLANTABLE DEVICE N/A 05/18/2016   Procedure: ICD Implant;  Surgeon:  Will Meredith Leeds, MD;  Location: [facility] CV LAB;  Service: Cardiovascular;  Laterality: N/A;  . ESOPHAGOGASTRODUODENOSCOPY (EGD) WITH PROPOFOL N/A 10/02/2018   Procedure: ESOPHAGOGASTRODUODENOSCOPY (EGD) WITH PROPOFOL;  Surgeon: Daneil Dolin, MD;  Location: [facility];  Service: Endoscopy;  Laterality: N/A;  . EYE SURGERY    . ingrown toenail N/A   . LEFT HEART CATHETERIZATION WITH CORONARY/GRAFT ANGIOGRAM N/A 12/21/2014   Procedure: LEFT HEART CATHETERIZATION WITH Beatrix Fetters;  Surgeon: Belva Crome, MD;  Location: [facility] CATH LAB;  Service: Cardiovascular;  Laterality: N/A;  . NASAL SEPTOPLASTY W/ TURBINOPLASTY Bilateral 04/25/2017   NASAL SEPTOPLASTY WITH TURBINATE REDUCTION/notes 04/25/2017  . NASAL SEPTOPLASTY W/ TURBINOPLASTY Bilateral 04/25/2017   Procedure: NASAL SEPTOPLASTY WITH TURBINATE REDUCTION;  Surgeon: Leta Baptist, MD;  Location: [facility];  Service: ENT;  Laterality: Bilateral;  . RIGHT/LEFT HEART CATH AND CORONARY/GRAFT ANGIOGRAPHY N/A 10/05/2017   Procedure: RIGHT/LEFT HEART CATH AND CORONARY/GRAFT ANGIOGRAPHY;  Surgeon: Sherren Mocha, MD;  Location: [facility] CV LAB;  Service: Cardiovascular;  Laterality: N/A;  . TEE WITHOUT CARDIOVERSION N/A 11/20/2017   Procedure: TRANSESOPHAGEAL ECHOCARDIOGRAM (TEE);  Surgeon: Sherren Mocha, MD;  Location: [facility];  Service: Open Heart Surgery;  Laterality: N/A;  .  TONSILLECTOMY    . TRANSCATHETER AORTIC VALVE REPLACEMENT, TRANSFEMORAL N/A 11/20/2017   Procedure: TRANSCATHETER AORTIC VALVE REPLACEMENT, TRANSFEMORAL;  Surgeon: Sherren Mocha, MD;  Location: [facility];  Service: Open Heart Surgery;  Laterality: N/A;      Social History   reports that he quit smoking about 20 years ago. He has never used smokeless tobacco. He reports that he does not drink alcohol or use drugs.    Family History   His family history includes CAD in his mother; Diabetes in his mother. There is no history of Colon cancer or Colon polyps. He was adopted.    Allergies Allergies  Allergen Reactions  . Dyflex-G [Dyphylline-Guaifenesin] Hives  . Eliquis [Apixaban] Other (See Comments)    Side effects not a true allergy- he stated that it made him feel bad      Home Medications  Prior to Admission medications   Medication Sig Start Date End Date Taking? Authorizing Provider  acetaminophen (TYLENOL) 500 MG tablet Take 1 tablet (500 mg total) by mouth 2 (two) times daily. Patient taking differently: Take 1,500 mg by mouth daily as needed for moderate pain.  10/29/18   Kathyrn Drown, MD  blood glucose meter kit and supplies KIT Dispense based on patient and insurance preference. Test  up to four times daily as directed.Dx: E11.9 06/30/19   Kathyrn Drown, MD  calcium carbonate (TUMS - DOSED IN MG ELEMENTAL CALCIUM) 500 MG chewable tablet Chew 2 tablets by mouth 2 (two) times daily as needed for indigestion or heartburn.     [provider]  carvedilol (COREG) 3.125 MG tablet TAKE (1) TABLET BY MOUTH TWICE DAILY WITH A MEAL. Patient taking differently: Take 3.125 mg by mouth 2 (two) times daily with a meal.  06/12/19   Luking, Elayne Snare, MD  ferrous sulfate 325 (65 FE) MG tablet Take 325 mg by mouth daily.    [provider]  FIBER PO Take 5 capsules by mouth daily.    [provider]  HYDROcodone-acetaminophen (NORCO) 10-325 MG tablet Take one tablet po  twice daily prn pain Patient taking differently: Take 1 tablet by mouth 2 (two) times daily as needed for moderate pain.  07/23/19   Kathyrn Drown, MD  insulin  aspart (NOVOLOG FLEXPEN) 100 UNIT/ML FlexPen Inject 60 Units into the skin 2 (two) times daily with a meal. 04/21/19   Luking, Elayne Snare, MD  LEVEMIR FLEXTOUCH 100 UNIT/ML Pen INJECT 60 UNITS INTO THE SKIN AT BEDTIME. 07/14/19   Luking, Scott A, MD  Lidocaine HCl (LIDOCAINE PLUS) 4 % CREA Apply 1 application topically daily as needed (itching).    [provider]  linaclotide Rolan Lipa) 290 MCG CAPS capsule Take 1 capsule (290 mcg total) by mouth daily before breakfast. 12/24/18   Kathyrn Drown, MD  liver oil-zinc oxide (DESITIN) 40 % ointment Apply 1 application topically as needed for irritation.    [provider]  Menthol-Methyl Salicylate (GOODSENSE MUSCLE RUB) 8-30 % CREA Apply 1 application topically daily as needed (muscle pain).    [provider]  metolazone (ZAROXOLYN) 2.5 MG tablet TAKE ONE TABLET BY MOUTH DAILY. Patient taking differently: Take 2.5 mg by mouth See admin instructions. Take 2.5 mg on Friday, may take a second 2.5 mg dose during the week as needed for excessive fluid 09/02/18   Arnoldo Lenis, MD  Multiple Vitamins-Minerals (CENTRUM SILVER PO) Take 1 tablet by mouth daily.     [provider]  Na Sulfate-K Sulfate-Mg Sulf 17.5-3.13-1.6 GM/177ML SOLN Suprep-Use as directed 06/24/19   Willia Craze, NP  nitroGLYCERIN (NITROSTAT) 0.4 MG SL tablet PLACE 1 TABLET UNDER TONGUE FOR CHEST PAIN. MAY REPEAT EVERY 5 MIN UP TO 3 DOSES-NO RELIEF,CALL 911. Patient taking differently: Place 0.8 mg under the tongue every 5 (five) minutes as needed for chest pain.  07/09/19   Sherren Mocha, MD  oxymetazoline [facility] SINUS-MAX SINUS/ALLRGY) 0.05 % nasal spray Place 1 spray into both nostrils 2 (two) times daily as needed for congestion.    [provider]  pantoprazole (PROTONIX) 40  MG tablet Take 1 tablet (40 mg total) by mouth 2 (two) times daily. Patient taking differently: Take 40 mg by mouth 2 (two) times daily as needed (acid reflux).  10/09/18 10/09/19  Kathyrn Drown, MD  polycarbophil (FIBERCON) 625 MG tablet Take 2 tablets (1,250 mg total) by mouth daily with lunch. Hold if you have diarrhea 09/18/16   Rosita Fire, MD  Polyethyl Glycol-Propyl Glycol (SYSTANE) 0.4-0.3 % GEL ophthalmic gel Place 1 application into both eyes every other day.     [provider]  potassium chloride SA (KLOR-CON M20) 20 MEQ tablet Take 3 qam, 1 at lunch, and 3 at supper Patient taking differently: Take 40-60 mEq by mouth See admin instructions. Take 60 meq in the morning, 60 meq at afternoon, and 40 meq at supper time 06/12/19   Kathyrn Drown, MD  rivaroxaban (XARELTO) 20 MG TABS tablet Take 1 tablet (20 mg total) by mouth daily with supper. 05/23/19   Arnoldo Lenis, MD  rosuvastatin (CRESTOR) 40 MG tablet TAKE ONE TABLET BY MOUTH DAILY. Patient taking differently: Take 40 mg by mouth daily.  07/18/19   Kathyrn Drown, MD  spironolactone (ALDACTONE) 25 MG tablet TAKE 1 TABLET BY MOUTH ONCE A DAY. Patient taking differently: Take 25 mg by mouth every Friday.  04/07/19   Camnitz, Ocie Doyne, MD  SURE COMFORT PEN NEEDLES 31G X 8 MM MISC USING 4 TIMES DAILY. 04/07/19   Kathyrn Drown, MD  torsemide (DEMADEX) 20 MG tablet TAKE 80 MG IN THE MORNING & TAKE 60 MG IN THE EVENING. Patient taking differently: Take 60-80 mg by mouth See admin instructions. TAKE 80 MG IN THE  MORNING & TAKE 60 MG IN THE EVENING. 06/26/19   Arnoldo Lenis, MD  traMADol (ULTRAM) 50 MG tablet TAKE 1 TABLET BY MOUTH EVERY 6 TO 8 HOURS AS NEEDED FOR HEADACHE. Patient taking differently: Take 50 mg by mouth every 8 (eight) hours as needed (headaches).  05/21/19   Kathyrn Drown, MD  trolamine salicylate (ASPERCREME) 10 % cream Apply 1 application topically 2 (two) times daily as needed for muscle  pain.     [provider]      Critical care time: Over 35 minutes was spent in chart review bedside evaluation and critical care planning

## 2019-08-26 NOTE — Progress Notes (Addendum)
Notified by RN that patient complaining of intermittent chest pressure asking for his home nitro in which he stated he takes frequently.    P:  Repeat EKG now Hs trop trend has been flat Called cardiology and asked that general cardiology see given his extensive cardiac history     Kennieth Rad, MSN, AGACNP-BC [facility] 08/26/2019, 12:34 PM

## 2019-08-26 NOTE — ED Notes (Signed)
Pt called out for chest pain. EKG obtained, EDP made aware.

## 2019-08-26 NOTE — Progress Notes (Addendum)
NAME:  [name] [name], MRN:  [id number], DOB:  Feb 15, 1951, LOS: 1 ADMISSION DATE:  08/25/2019, CONSULTATION DATE:  08/25/2019 REFERRING MD:  Vista Deck, CHIEF COMPLAINT:  Decompensated HF  Brief History   Goes by "little Ronalee Belts  [id number] male presented with severe fatigue, nausea and home defibrillation of ICD prior to admission.  Was seen with episode of V. tach at 1515 prior to admission with patient reported syncopal episode at home. He then again had another epiosde of V. Tach while in the ED with brief episode of apnea. Known history of ejection fraction of 15 to 20% with extensive cardiovascular history.   Past Medical History  CAD s/p CABG x6 2001 S/p TAVR 2019 Paroxysmal A-fib OSA MI Morbid obesity Hyperlipidemia Type 2 diabetes  Systolic CHF EF 31-51% Ischemic cardiomyopathy  [facility] Events   12/28 Admitted   Consults:  Cardiology   Procedures:    Significant Diagnostic Tests:  12/28 CXR R pleural effusion, atelectasis, bilateral interstitial edema.  AICD in place.  Sternal wires in place.  03/03/2019: TTE IMPRESSIONS 1. The left ventricle has a visually estimated ejection fraction of 15-20%. The cavity size was moderately dilated. Left ventricular diastolic Doppler parameters are indeterminate. 2. The right ventricle has moderately reduced systolic function. The cavity was moderately enlarged. There is no increase in right ventricular wall thickness. Right ventricular systolic pressure 76.1 mmHg. 3. Left atrial size was severely dilated. 4. Right atrial size was mildly dilated. 5. There is mild mitral annular calcification present. 6. A 103mm Edwards Sapien bioprosthetic aortic valve (TAVR) valve is present in the aortic position. Procedure Date: 11/20/2017. Leaflet excursion appears normal. No prosthetic or paravalvular regurgitation. Mean systolic gradient 8 mmHg. 7. The aortic root and ascending aorta are normal in size and  structure. 8. The inferior vena cava was dilated in size with <50% respiratory variability. 9. No intracardiac thrombi or masses were visualized with Definity contrast. 10. When compared to the prior study: 01/04/2018 - no significant change has occured. Aortic valve systolic Doppler gradient better visualized on today's exam.  Micro Data:  COVID 12/28 > negative   Antimicrobials:  N/A  Interim history/subjective:  C/o of sore throat otherwise denies pain or SOB No further VT overnight, occasional PVC Pending repeat am labs  Objective   Blood pressure 118/79, pulse 80, temperature 98.7 F (37.1 C), temperature source Oral, resp. rate (!) 22, height 5\' 10"  (1.778 m), weight 114.8 kg, SpO2 96 %.        Intake/Output Summary (Last 24 hours) at 08/26/2019 0843 Last data filed at 08/26/2019 0800 Gross per 24 hour  Intake 319.19 ml  Output --  Net 319.19 ml   Filed Weights   08/25/19 1707  Weight: 114.8 kg    Examination: General:  Older adult male sitting upright in bed in NAD HEENT: MM pink/moist, pupils 4/reactive Neuro: alert, oriented, MAE CV: rr ir, no murmur- 1 degree av block, rate 70's occasional PVC PULM:  Non labored, clear and diminished, currently on 2L [place] GI: pbese. Soft, BS+ Extremities: warm/dry, no LE edema  Skin: no rashes    Resolved Hospital Problem list     Assessment & Plan:  This is a 68 year old male with an extensive cardiac history including ischemic cardiomyopathy with an EF of 15 to 20% status post AICD and TAVR now with recurrent V. tach and syncope.  AICD fired while he was at home and in the emergency department. Found to be  severely hypokalemic and COVID positive   Re-occurant VT -2 episodes of VT 12/28 with syncopal/apneic episodes witnessed  Plan: Ongoing tele monitoring  No further VT overnight Cards to consult Pending BMET, goal K > 4, Mag >2 Will need interrogation of ICD  Severe hypokalemia  Dehydration  -Potassium less  than 2 on admission -likely secondary to excessive GI loss/ diurectics   Plan Pending BMET/ mag Replete for goal K >4, Mag > 2 Strict I/Os  HX of Systolic CHF EF 27-06% ECHO 02/2019 HX ICM PAF S/P TAVR 2019 Plan: Cardiology consulted, appreciate assistance  Hold home metolazone, spironlactone, demadex for now Continue Xarelto  Restart home coreg, crestor Strict I/O/ daily wts  Anion gap acidosis  -In the setting of excessive GI lose Plan: Pending BMP  Diarrhea  -Patient reports several days history of diarrhea prior to admission, however denies this is recent on 12/29 Plan: Pending GI panel Monitor and replete electrolytes as needed   Acute Kidney Injury  -in the setting of dehydration from GI lose. Baseline creatinine 1.1, creatinine on admission 1.28 P: Pending UA/ urine lytes/ am BMP/ mag  SARS 2 Positive with multifocal infiltrates and hypoxia requiring supplemental oxygen  P:  Will start on decadron x 10 days and remdesivir  Check inflammatory makers Supplemental O2  Intermittent CXR Aggressive pulm hygiene   DM P:  SSI q 4/ SSI Adding levemir reduced home dose  Global: patient remains hemodynamically stable with no further episodes of VT overnight. Weaning low supplemental O2 requirements with no respiratory distress.  Pending am labs, patient is stable to transfer to telemetry and to [facility] as of 12/30.  Best practice:  Diet: Cardiac, advance as tolerated Pain/Anxiety/Delirium protocol (if indicated): PRNs VAP protocol (if indicated): N/A DVT prophylaxis: SCD GI prophylaxis: PPI Glucose control: SSI Mobility: Bedrest  Code Status: Full code  Family Communication: patient updated on plan of care Disposition: ICU - pending transfer to tele later today   Labs   CBC: Recent Labs  Lab 08/25/19 1708 08/26/19 0754  WBC 5.7 6.4  NEUTROABS 4.7  --   HGB 14.7 13.7  HCT 45.2 41.2  MCV 96.8 94.3  PLT 194 237    Basic Metabolic Panel: Recent Labs    Lab 08/25/19 1708 08/26/19 0032  NA 135  --   K <2.0* 2.3*  CL 74*  --   CO2 41*  --   GLUCOSE 178*  --   BUN 33*  --   CREATININE 1.28*  --   CALCIUM 7.4*  --   MG 1.9  --    GFR: Estimated Creatinine Clearance: 70.1 mL/min (A) (by C-G formula based on SCr of 1.28 mg/dL (H)). Recent Labs  Lab 08/25/19 1708 08/26/19 0754  WBC 5.7 6.4    Liver Function Tests: Recent Labs  Lab 08/25/19 1708  AST 40  ALT 16  ALKPHOS 91  BILITOT 3.7*  PROT 7.5  ALBUMIN 3.1*   No results for input(s): LIPASE, AMYLASE in the last 168 hours. No results for input(s): AMMONIA in the last 168 hours.  ABG    Component Value Date/Time   PHART 7.315 (L) 11/20/2017 1109   PCO2ART 46.3 11/20/2017 1109   PO2ART 93.0 11/20/2017 1109   HCO3 23.6 11/20/2017 1109   TCO2 29 11/20/2017 1258   ACIDBASEDEF 3.0 (H) 11/20/2017 1109   O2SAT 96.0 11/20/2017 1109     Coagulation Profile: No results for input(s): INR, PROTIME in the last 168 hours.  Cardiac Enzymes: No  results for input(s): CKTOTAL, CKMB, CKMBINDEX, TROPONINI in the last 168 hours.  HbA1C: Hgb A1c MFr Bld  Date/Time Value Ref Range Status  06/09/2019 01:56 PM 8.8 (H) 4.8 - 5.6 % Final    Comment:             Prediabetes: 5.7 - 6.4          Diabetes: >6.4          Glycemic control for adults with diabetes: <7.0   03/03/2019 03:52 PM 13.0 (H) 4.8 - 5.6 % Final    Comment:             Prediabetes: 5.7 - 6.4          Diabetes: >6.4          Glycemic control for adults with diabetes: <7.0     CBG: Recent Labs  Lab 08/26/19 0801  GLUCAP [place], MSN, AGACNP-BC [facility] 08/26/2019, 8:44 AM

## 2019-08-26 NOTE — Progress Notes (Signed)
CRITICAL VALUE ALERT  Critical Value: Potassium 2.5 Date & Time Notied:  08/26/19  Provider Notified: Kennieth Rad NP Orders Received/Actions taken: Will place orders

## 2019-08-27 DIAGNOSIS — I10 Essential (primary) hypertension: Secondary | ICD-10-CM

## 2019-08-27 DIAGNOSIS — E1121 Type 2 diabetes mellitus with diabetic nephropathy: Secondary | ICD-10-CM

## 2019-08-27 DIAGNOSIS — I5042 Chronic combined systolic (congestive) and diastolic (congestive) heart failure: Secondary | ICD-10-CM

## 2019-08-27 DIAGNOSIS — Z952 Presence of prosthetic heart valve: Secondary | ICD-10-CM

## 2019-08-27 DIAGNOSIS — E785 Hyperlipidemia, unspecified: Secondary | ICD-10-CM

## 2019-08-27 DIAGNOSIS — I480 Paroxysmal atrial fibrillation: Secondary | ICD-10-CM

## 2019-08-27 LAB — RENAL FUNCTION PANEL
Albumin: 2.7 g/dL — ABNORMAL LOW (ref 3.5–5.0)
Anion gap: 17 — ABNORMAL HIGH (ref 5–15)
BUN: 30 mg/dL — ABNORMAL HIGH (ref 8–23)
CO2: 35 mmol/L — ABNORMAL HIGH (ref 22–32)
Calcium: 7.2 mg/dL — ABNORMAL LOW (ref 8.9–10.3)
Chloride: 83 mmol/L — ABNORMAL LOW (ref 98–111)
Creatinine, Ser: 1.25 mg/dL — ABNORMAL HIGH (ref 0.61–1.24)
GFR calc Af Amer: >60 mL/min (ref >60)
GFR calc non Af Amer: 59 mL/min — ABNORMAL LOW (ref >60)
Glucose, Bld: 179 mg/dL — ABNORMAL HIGH (ref 70–99)
Phosphorus: 1.7 mg/dL — ABNORMAL LOW (ref 2.5–4.6)
Potassium: 2.8 mmol/L — ABNORMAL LOW (ref 3.5–5.1)
Sodium: 135 mmol/L (ref 135–145)

## 2019-08-27 LAB — BASIC METABOLIC PANEL
Anion gap: 17 — ABNORMAL HIGH (ref 5–15)
BUN: 37 mg/dL — ABNORMAL HIGH (ref 8–23)
CO2: 31 mmol/L (ref 22–32)
Calcium: 7.3 mg/dL — ABNORMAL LOW (ref 8.9–10.3)
Chloride: 85 mmol/L — ABNORMAL LOW (ref 98–111)
Creatinine, Ser: 1.41 mg/dL — ABNORMAL HIGH (ref 0.61–1.24)
GFR calc Af Amer: 59 mL/min — ABNORMAL LOW (ref >60)
GFR calc non Af Amer: 51 mL/min — ABNORMAL LOW (ref >60)
Glucose, Bld: 231 mg/dL — ABNORMAL HIGH (ref 70–99)
Potassium: 4.2 mmol/L (ref 3.5–5.1)
Sodium: 133 mmol/L — ABNORMAL LOW (ref 135–145)

## 2019-08-27 LAB — GLUCOSE, CAPILLARY
Glucose-Capillary: 204 mg/dL — ABNORMAL HIGH (ref 70–99)
Glucose-Capillary: 175 mg/dL — ABNORMAL HIGH (ref 70–99)
Glucose-Capillary: 199 mg/dL — ABNORMAL HIGH (ref 70–99)
Glucose-Capillary: 263 mg/dL — ABNORMAL HIGH (ref 70–99)
Glucose-Capillary: 253 mg/dL — ABNORMAL HIGH (ref 70–99)

## 2019-08-27 LAB — MAGNESIUM: Magnesium: 2.2 mg/dL (ref 1.7–2.4)

## 2019-08-27 MED ORDER — SODIUM CHLORIDE 0.9% FLUSH: 3.0000 mL | INTRAVENOUS | Status: DC | PRN

## 2019-08-27 MED ORDER — NITROGLYCERIN 0.4 MG SL SUBL
SUBLINGUAL_TABLET | SUBLINGUAL | Status: AC
  Administered 2019-08-27: 0.4 mg
  Filled 2019-08-27: qty 1

## 2019-08-27 MED ORDER — SODIUM CHLORIDE 0.9 % IV BOLUS
500.0000 mL | Freq: Once | INTRAVENOUS | Status: AC
  Administered 2019-08-27: 500 mL via INTRAVENOUS

## 2019-08-27 MED ORDER — SPIRONOLACTONE 25 MG PO TABS
25.0000 mg | ORAL_TABLET | Freq: Every day | ORAL | Status: DC
  Administered 2019-08-27 – 2019-08-28 (×2): 25 mg via ORAL
  Filled 2019-08-27 (×3): qty 1

## 2019-08-27 MED ORDER — POTASSIUM CHLORIDE CRYS ER 20 MEQ PO TBCR
40.0000 meq | EXTENDED_RELEASE_TABLET | Freq: Once | ORAL | Status: AC
  Administered 2019-08-27: 09:00:00 40 meq via ORAL
  Filled 2019-08-27: qty 2

## 2019-08-27 MED ORDER — POTASSIUM CHLORIDE 20 MEQ/15ML (10%) PO SOLN: 40.0000 meq | ORAL | Status: DC

## 2019-08-27 MED ORDER — POTASSIUM PHOSPHATES 15 MMOLE/5ML IV SOLN
30.0000 mmol | Freq: Once | INTRAVENOUS | Status: AC
  Administered 2019-08-27: 10:00:00 30 mmol via INTRAVENOUS
  Filled 2019-08-27: qty 10

## 2019-08-27 MED ORDER — SODIUM CHLORIDE 0.9% FLUSH: 3.0000 mL | Freq: Two times a day (BID) | INTRAVENOUS | Status: DC

## 2019-08-27 MED ORDER — ALUM & MAG HYDROXIDE-SIMETH 200-200-20 MG/5ML PO SUSP
30.0000 mL | Freq: Four times a day (QID) | ORAL | Status: DC | PRN
  Administered 2019-08-27 – 2019-08-28 (×4): 30 mL via ORAL
  Filled 2019-08-27 (×3): qty 30

## 2019-08-27 MED ORDER — POTASSIUM CHLORIDE CRYS ER 20 MEQ PO TBCR: 40.0000 meq | EXTENDED_RELEASE_TABLET | ORAL | Status: DC

## 2019-08-27 MED ORDER — POTASSIUM CHLORIDE CRYS ER 20 MEQ PO TBCR
40.0000 meq | EXTENDED_RELEASE_TABLET | ORAL | Status: AC
  Administered 2019-08-27 (×2): 40 meq via ORAL
  Filled 2019-08-27 (×2): qty 2

## 2019-08-27 MED ORDER — PANTOPRAZOLE SODIUM 40 MG PO TBEC
40.0000 mg | DELAYED_RELEASE_TABLET | Freq: Every day | ORAL | Status: DC
  Administered 2019-08-27 – 2019-08-28 (×2): 40 mg via ORAL
  Filled 2019-08-27 (×2): qty 1

## 2019-08-27 MED ORDER — SODIUM CHLORIDE 0.9% FLUSH
3.0000 mL | Freq: Two times a day (BID) | INTRAVENOUS | Status: DC
  Administered 2019-08-27 – 2019-08-28 (×3): 3 mL via INTRAVENOUS

## 2019-08-27 NOTE — Progress Notes (Signed)
   08/27/19 1120  Vitals  BP 114/82  MAP (mmHg) 92  BP Location Left Arm  BP Method Automatic  Patient Position (if appropriate) Sitting  Pulse Rate 85  Pulse Rate Source Monitor  ECG Heart Rate 85  Resp (!) 21  Oxygen Therapy  SpO2 96 %  O2 Device Nasal Cannula  O2 Flow Rate (L/min) 2 L/min  MEWS Score  MEWS RR 1  MEWS Pulse 0  MEWS Systolic 0  MEWS LOC 0  MEWS Temp 0  MEWS Score 1  MEWS Score Color Green    Patient complaining of 3/10 chest pain and requesting NTG sl.  States this "happens a lot at home and I always take NTG.".  Pain = 0 1 minute after NTG given.  See [facility]

## 2019-08-27 NOTE — Progress Notes (Signed)
Subjective:  Denies SSCP, palpitations or Dyspnea Wants to go home No angina since transfer to 5W  Objective:  Vitals:   08/27/19 0437 08/27/19 0500 08/27/19 0600 08/27/19 0628  BP:   (!) 87/66 (!) 107/92  Pulse:  84 86 93  Resp:  20 19 17   Temp:    97.7 F (36.5 C)  TempSrc:    Oral  SpO2:  98% 97% 97%  Weight: 115.9 kg     Height:        Intake/Output from previous day:  Intake/Output Summary (Last 24 hours) at 08/27/2019 0848 Last data filed at 08/27/2019 0100 Gross per 24 hour  Intake 690.18 ml  Output 1000 ml  Net -309.82 ml    Physical Exam: Obese white male Basilar crackles sats 89% before deep breathing SEM through 29 mm TAVR valve AICD under left clavicle  Abdomen soft Trace LE edema   Lab Results: Basic Metabolic Panel: Recent Labs    08/26/19 0754 08/26/19 2100 08/27/19 0323 08/27/19 0500  NA 137 135 135  --   K 2.2* 2.8* 2.8*  --   CL 78* 83* 83*  --   CO2 37* 39* 35*  --   GLUCOSE 142* 229* 179*  --   BUN 33* 31* 30*  --   CREATININE 1.46* 1.37* 1.25*  --   CALCIUM 7.5* 7.1* 7.2*  --   MG 2.2  --   --  2.2  PHOS 2.5  --  1.7*  --    Liver Function Tests: Recent Labs    08/25/19 1708 08/26/19 0754 08/27/19 0323  AST 40  --   --   ALT 16  --   --   ALKPHOS 91  --   --   BILITOT 3.7*  --   --   PROT 7.5  --   --   ALBUMIN 3.1* 2.7* 2.7*   No results for input(s): LIPASE, AMYLASE in the last 72 hours. CBC: Recent Labs    08/25/19 1708 08/26/19 0754  WBC 5.7 6.4  NEUTROABS 4.7  --   HGB 14.7 13.7  HCT 45.2 41.2  MCV 96.8 94.3  PLT 194 197   BNP: Invalid input(s): POCBNP D-Dimer: Recent Labs    08/26/19 1111  DDIMER 0.78*   Hemoglobin A1C: Recent Labs    08/26/19 0754  HGBA1C 7.7*    Imaging: DG Chest Port 1 View  Result Date: 08/25/2019 CLINICAL DATA:  Chest pain and shortness of breath EXAM: PORTABLE CHEST 1 VIEW COMPARISON:  November 20, 2017 FINDINGS: There is airspace consolidation throughout portions of  the right mid and lower lung zones as well as in the left base. There is cardiomegaly. Pulmonary vascularity is within normal limits. Pacemaker lead is attached to the right ventricle. Patient is status post coronary artery bypass grafting. Status post aortic valve replacement. No adenopathy. No bone lesions. IMPRESSION: Areas of airspace opacity in the right mid lung and both bases, more on the right than on the left, findings consistent with multifocal pneumonia. Cardiomegaly. Pacemaker lead attached to right ventricle. Status post coronary artery bypass grafting. Aortic valve replacement noted. No adenopathy demonstrable. Electronically Signed   By: Lowella Grip III M.D.   On: 08/25/2019 18:26    Cardiac Studies:  ECG: SR LBBB PVC   Telemetry: NSR no VT   Echo: EF 15-20%   Medications:   . aspirin EC  81 mg Oral Daily  . carvedilol  3.125 mg Oral BID WC  .  Chlorhexidine Gluconate Cloth  6 each Topical Daily  . dexamethasone (DECADRON) injection  6 mg Intravenous Q24H  . insulin aspart  0-9 Units Subcutaneous Q4H  . insulin detemir  20 Units Subcutaneous BID  . isosorbide dinitrate  5 mg Oral TID  . pantoprazole  40 mg Oral Q0600  . potassium chloride  40 mEq Oral Once  . rivaroxaban  20 mg Oral Q supper  . rosuvastatin  40 mg Oral q1800  . sodium citrate-citric acid  15 mL Oral TID     . potassium PHOSPHATE IVPB (in mmol)    . remdesivir 100 mg in NS 100 mL      Assessment/Plan:   1. Angina:  Post CABG with patent grafts by cath 10/05/17 prior to angina on beta blocker ASA nitrates added yesterday Can increase to 10 mg tid if needed Prior to d/c would change to imdur 30 mg daily Will arrange outpatient f/u with Dr Harl Bowie and Burt Knack   2. TAVR:  29 mm Sapien 3 valve normal function   3. VT:  Per EP appropriate d/c on admission K still only 2.8 need to aggressively supplement No further arrhythmias device with normal function and baseline ECG with LBBB  4. CHF:  I/O negative  will resume aldactone today and hold on other loop diuretics until K better   He may not choose to stay in hospital for full Rx of COVID infection   Cardiac status is stable will sign off   Jenkins Rouge 08/27/2019, 8:48 AM

## 2019-08-27 NOTE — Progress Notes (Signed)
NP asked to f/up labs by attending. K is now 4.2 (goal over 4). Mg is 2.2. Creat bumped a bit so NS 500cc bolus over 2 hours ordered.  KJKG, NP [facility]

## 2019-08-27 NOTE — Progress Notes (Signed)
[facility] Progress Note Patient Name: [name] [name] DOB: 1950/12/15 MRN: [id number]   Date of Service  08/27/2019  HPI/Events of Note  Pt asking for an antacid to relieve dyspepsia  eICU Interventions  Maalox 30 ml PO Q 6 hours PRN dyspepsia ordered        Frederik Pear 08/27/2019, 4:43 AM

## 2019-08-27 NOTE — Progress Notes (Signed)
PROGRESS NOTE    BRYN PERKIN  TFT:[id number] DOB: May 03, 1951 DOA: 08/25/2019 PCP: Kathyrn Drown, MD    Brief Narrative:  Goes by "little [employer]  [phone] male presented with severe fatigue, nausea and home defibrillation of ICD prior to admission.  Was seen with episode of V. tach at 1515 prior to admission with patient reported syncopal episode at home. He then again had another epiosde of V. Tach while in the ED with brief episode of apnea. Known history of ejection fraction of 15 to 20% with extensive cardiovascular history.   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 tach on 08/25/2019 with a syncopal/apneic episodes one witnessed in the ED.  Patient also noted to be severely hypokalemic.  Patient being followed by cardiology and EP.  Diuretics on hold.  Replete electrolytes to keep potassium greater than 4 and magnesium greater than 2.  Per cardiology Baseline EKG with left bundle branch block.  Outpatient follow-up with EP and cardiology.  2.  Chronic systolic heart failure Currently euvolemic.  Cardiology following and cardiology resuming Aldactone today and recommending to hold other loop diuretics until potassium has been repleted.  Continue Isordil, Crestor, Coreg.  Per cardiology.  3.  Covid pneumonia Patient improving clinically.  Patient on O2 with sats of 94 to 98% on 2 L.  Continue IV remdesivir x5 days, IV  Decadron x10 days.  Patient adamant to be discharged and stating he will stay only 1 extra day.  Will likely need to be set up with an outpatient remdesivir infusion.  4.  Hyperlipidemia Continue statin.  5.  Severe hypokalemia Patient with potassium and IV fluids.  Patient received oral potassium overnight.  We will give K-Dur 40 mEq p.o. x1.  Repeat labs this afternoon.  Check a magnesium level.  Follow.  6.  Paroxysmal atrial fibrillation/status post TAVR 2019 Stable.  Continue Coreg for rate control.  Xarelto for anticoagulation.  Per cardiology.  7.  Diabetes mellitus type 2 Hemoglobin A1c 7.7.  CBG 199 this morning.  Continue current dose of Levemir and sliding scale insulin.  8.  Acute kidney injury Likely in the setting of diuretics and dehydration.  Improved.  9.  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  Continue beta-blocker and aspirin.  Patient started on nitrates.  Per cardiology may increase to 10 mg 3 times daily of Isordil and on discharge could change to Imdur 30 mg daily.  Will need outpatient follow-up with cardiology.    DVT prophylaxis: Xarelto Code Status: Full Family Communication: Updated patient.  No family at bedside. Disposition Plan: Likely home when clinically improved, resolution of electrolyte abnormalities, hopefully in the next 24 to 48 hours.   Consultants:   Cardiology: Dr. Johnsie Cancel 08/26/2019  EP  Procedures:   Chest x-ray 08/25/2019  Antimicrobials:   IV remdesivir 08/26/2019   Subjective: Patient sitting up at bedside and adamant to be discharged  home today.  Patient denies any chest pain or shortness of breath.  Patient agreed to stay 1 more day.  No episodes of V. tach noted.  Patient denies any syncopal episodes.  Objective: Vitals:   08/27/19 1030 08/27/19 1120 08/27/19 1125 08/27/19 1504  BP:  114/82 104/76   Pulse: 85 85 84   Resp: (!) 22 (!) 21 18   Temp:   97.6 F (36.4 C) (!) 97.5 F (36.4 C)    TempSrc:   Oral Oral  SpO2: 95% 96% 94%   Weight:      Height:        Intake/Output Summary (Last 24 hours) at 08/27/2019 1759 Last data filed at 08/27/2019 0900 Gross per 24 hour  Intake 560.15 ml  Output 150 ml  Net 410.15 ml   Filed Weights   08/25/19 1707 08/27/19 0437  Weight: 114.8 kg 115.9 kg    Examination:  General exam: Appears calm and comfortable  Respiratory system: Clear to auscultation. Respiratory effort normal. Cardiovascular system: S1 & S2 heard, RRR. No JVD, murmurs, rubs, gallops or clicks. No pedal edema. Gastrointestinal system: Abdomen is nondistended, soft and nontender. No organomegaly or masses felt. Normal bowel sounds heard. Central nervous system: Alert and oriented. No focal neurological deficits. Extremities: Symmetric 5 x 5 power. Skin: No rashes, lesions or ulcers Psychiatry: Judgement and insight appear normal. Mood & affect appropriate.     Data Reviewed: I have personally reviewed following labs and imaging studies  CBC: Recent Labs  Lab 08/25/19 1708 08/26/19 0754  WBC 5.7 6.4  NEUTROABS 4.7  --   HGB 14.7 13.7  HCT 45.2 41.2  MCV 96.8 94.3  PLT 194 384   Basic Metabolic Panel: Recent Labs  Lab 08/25/19 1708 08/26/19 0032 08/26/19 0754 08/26/19 1443 08/26/19 2100 08/27/19 0323 08/27/19 0500  NA 135  --  137  --  135 135  --   K <2.0* 2.3* 2.2* 2.5* 2.8* 2.8*  --   CL 74*  --  78*  --  83* 83*  --   CO2 41*  --  37*  --  39* 35*  --   GLUCOSE 178*  --  142*  --  229* 179*  --   BUN 33*  --  33*  --  31* 30*  --   CREATININE 1.28*  --  1.46*  --  1.37* 1.25*  --   CALCIUM 7.4*  --  7.5*  --  7.1* 7.2*  --   MG 1.9  --  2.2  --   --   --  2.2  PHOS  --   --  2.5  --   --  1.7*  --    GFR: Estimated Creatinine Clearance: 72.2 mL/min (A) (by C-G formula based on SCr of 1.25 mg/dL (H)). Liver Function Tests: Recent Labs  Lab 08/25/19 1708 08/26/19 0754 08/27/19 0323  AST 40  --   --   ALT 16  --   --    ALKPHOS 91  --   --   BILITOT 3.7*  --   --   PROT 7.5  --   --   ALBUMIN 3.1* 2.7* 2.7*   No results for input(s): LIPASE, AMYLASE in the last 168 hours. No results for input(s): AMMONIA in the last 168 hours. Coagulation Profile: No results for input(s): INR, PROTIME in the last 168 hours. Cardiac Enzymes: No results for input(s): CKTOTAL, CKMB, CKMBINDEX, TROPONINI in the last 168 hours.  BNP (last 3 results) No results for input(s): PROBNP in the last 8760 hours. HbA1C: Recent Labs    08/26/19 0754  HGBA1C 7.7*   CBG: Recent Labs  Lab 08/26/19 2338 08/27/19 0406 08/27/19 0854 08/27/19 1124 08/27/19 1705  GLUCAP 233* 204* 175* 199* 263*   Lipid Profile: No results for input(s): CHOL, HDL, LDLCALC, TRIG, CHOLHDL, LDLDIRECT in the last 72 hours. Thyroid Function Tests: No results for input(s): TSH, T4TOTAL, FREET4, T3FREE, THYROIDAB in the last 72 hours. Anemia Panel: No results for input(s): VITAMINB12, FOLATE, FERRITIN, TIBC, IRON, RETICCTPCT in the last 72 hours. Sepsis Labs: Recent Labs  Lab 08/26/19 1111  PROCALCITON 0.10    Recent Results (from the past 240 hour(s))  Respiratory Panel by RT PCR (Flu A&B, Covid) - Nasopharyngeal Swab     Status: Abnormal   Collection Time: 08/25/19  6:29 PM   Specimen: Nasopharyngeal Swab  Result Value Ref Range Status   SARS Coronavirus 2 by RT PCR POSITIVE (A) NEGATIVE Final    Comment: RESULT CALLED TO, READ BACK BY AND VERIFIED WITH: NICHOLS,K. AT 2015 ON 08/25/2019 BY EVA (NOTE) SARS-CoV-2 target nucleic acids are DETECTED. SARS-CoV-2 RNA is generally detectable in upper respiratory specimens  during the acute phase of infection. Positive results are indicative of the presence of the identified virus, but do not rule out bacterial infection or co-infection with other pathogens not detected by the test. Clinical correlation with patient history and other diagnostic information is necessary to determine  patient infection status. The expected result is Negative. Fact Sheet for Patients:  PinkCheek.be Fact Sheet for Healthcare Providers: GravelBags.it This test is not yet approved or cleared by the [place] FDA and  has been authorized for detection and/or diagnosis of SARS-CoV-2 by FDA under an Emergency Use Authorization (EUA).  This EUA will remain in effect (meaning this test can be use d) for the duration of  the COVID-19 declaration under Section 564(b)(1) of the Act, 21 U.S.C. section 360bbb-3(b)(1), unless the authorization is terminated or revoked sooner.    Influenza A by PCR NEGATIVE NEGATIVE Final   Influenza B by PCR NEGATIVE NEGATIVE Final    Comment: (NOTE) The Xpert Xpress SARS-CoV-2/FLU/RSV assay is intended as an aid in  the diagnosis of influenza from Nasopharyngeal swab specimens and  should not be used as a sole basis for treatment. Nasal washings and  aspirates are unacceptable for Xpert Xpress SARS-CoV-2/FLU/RSV  testing. Fact Sheet for Patients: PinkCheek.be Fact Sheet for Healthcare Providers: GravelBags.it This test is not yet approved or cleared by the [place] FDA and  has been authorized for detection and/or diagnosis of SARS-CoV-2 by  FDA under an Emergency Use Authorization (EUA). This EUA will remain  in effect (meaning this test can be used) for the duration of the  Covid-19 declaration under Section 564(b)(1) of the Act, 21  U.S.C. section 360bbb-3(b)(1), unless the authorization is  terminated or revoked. Performed at [facility], [address] [place], [place] [address]   MRSA PCR Screening     Status: None   Collection Time: 08/26/19  5:08 AM   Specimen: Nasal Mucosa; Nasopharyngeal  Result Value Ref Range Status   MRSA by PCR NEGATIVE NEGATIVE Final    Comment:        The GeneXpert MRSA Assay (FDA approved for  NASAL specimens only), is one component of a comprehensive MRSA colonization surveillance program. It is not intended to diagnose MRSA infection nor to guide or monitor treatment for MRSA  infections. Performed at [facility], [place] [address] [place], [place] [address]          Radiology Studies: DG Chest Port 1 View  Result Date: 08/25/2019 CLINICAL DATA:  Chest pain and shortness of breath EXAM: PORTABLE CHEST 1 VIEW COMPARISON:  November 20, 2017 FINDINGS: There is airspace consolidation throughout portions of the right mid and lower lung zones as well as in the left base. There is cardiomegaly. Pulmonary vascularity is within normal limits. Pacemaker lead is attached to the right ventricle. Patient is status post coronary artery bypass grafting. Status post aortic valve replacement. No adenopathy. No bone lesions. IMPRESSION: Areas of airspace opacity in the right mid lung and both bases, more on the right than on the left, findings consistent with multifocal pneumonia. Cardiomegaly. Pacemaker lead attached to right ventricle. Status post coronary artery bypass grafting. Aortic valve replacement noted. No adenopathy demonstrable. Electronically Signed   By: Lowella Grip III M.D.   On: 08/25/2019 18:26        Scheduled Meds: . aspirin EC  81 mg Oral Daily  . carvedilol  3.125 mg Oral BID WC  . Chlorhexidine Gluconate Cloth  6 each Topical Daily  . dexamethasone (DECADRON) injection  6 mg Intravenous Q24H  . insulin aspart  0-9 Units Subcutaneous Q4H  . insulin detemir  20 Units Subcutaneous BID  . isosorbide dinitrate  5 mg Oral TID  . pantoprazole  40 mg Oral Q0600  . rivaroxaban  20 mg Oral Q supper  . rosuvastatin  40 mg Oral q1800  . sodium chloride flush  3 mL Intravenous Q12H  . sodium citrate-citric acid  15 mL Oral TID   Continuous Infusions: . remdesivir 100 mg in NS 100 mL 100 mg (08/27/19 1012)     LOS: 2 days    Time spent: 40  minutes    Irine Seal, MD [facility]  If 7PM-7AM, please contact night-coverage [email or web address] 08/27/2019, 5:59 PM

## 2019-08-27 NOTE — Progress Notes (Signed)
[facility] Progress Note Patient Name: [name] [name] DOB: 1950-11-22 MRN: [id number]   Date of Service  08/27/2019  HPI/Events of Note  K+ 2.8  eICU Interventions  KCL 40 meq via tube Q 4 hours x 2 doses        Carl Bleecker U Gisele Pack 08/27/2019, 1:24 AM

## 2019-08-27 NOTE — Telephone Encounter (Signed)
Patient currently inpatient at [facility] and will schedule follow up after discharge.

## 2019-08-27 NOTE — Progress Notes (Signed)
Patient has expressed repeated request to be discharged, despite continued hypokalemia and low sats on R/A.  Obviously dyspneic with little activity, though denies.  Dr. Grandville Silos notified of his desire to speak with him.

## 2019-08-27 NOTE — Progress Notes (Signed)
Patient assisted to commode.  Insists he has "3 out of 10" chest pain.  States he wants furosemide to get rid of it.  Patient has very poor understanding of his medications and rational for use.  I attempted to explain the reasoning behind treatment for his hypokalemia and the effects that lasix has on potassium levels, but he did not want to listen, stating, "I've been doing it like this for years!  He also states that he has been taking Spiranolactone as a "potassium booster" and he takes it once a week.  He is demanding that he have it now.  I attempted to explain to him via teachback and via handouts the action of the medications, but to no avail.

## 2019-08-28 ENCOUNTER — Encounter: Payer: Medicare Other | Admitting: Physician Assistant

## 2019-08-28 DIAGNOSIS — E860 Dehydration: Secondary | ICD-10-CM

## 2019-08-28 LAB — GLUCOSE, CAPILLARY
Glucose-Capillary: 136 mg/dL — ABNORMAL HIGH (ref 70–99)
Glucose-Capillary: 180 mg/dL — ABNORMAL HIGH (ref 70–99)
Glucose-Capillary: 185 mg/dL — ABNORMAL HIGH (ref 70–99)
Glucose-Capillary: 188 mg/dL — ABNORMAL HIGH (ref 70–99)

## 2019-08-28 LAB — CBC WITH DIFFERENTIAL/PLATELET
Abs Immature Granulocytes: 0 10*3/uL (ref 0.00–0.07)
Basophils Absolute: 0 10*3/uL (ref 0.0–0.1)
Basophils Relative: 0 %
Eosinophils Absolute: 0 10*3/uL (ref 0.0–0.5)
Eosinophils Relative: 0 %
HCT: 43.7 % (ref 39.0–52.0)
Hemoglobin: 14.5 g/dL (ref 13.0–17.0)
Lymphocytes Relative: 4 %
Lymphs Abs: 0.4 10*3/uL — ABNORMAL LOW (ref 0.7–4.0)
MCH: 32.1 pg (ref 26.0–34.0)
MCHC: 33.2 g/dL (ref 30.0–36.0)
MCV: 96.7 fL (ref 80.0–100.0)
Monocytes Absolute: 0.7 10*3/uL (ref 0.1–1.0)
Monocytes Relative: 7 %
Neutro Abs: 9.4 10*3/uL — ABNORMAL HIGH (ref 1.7–7.7)
Neutrophils Relative %: 89 %
Platelets: 296 10*3/uL (ref 150–400)
RBC: 4.52 MIL/uL (ref 4.22–5.81)
RDW: 17.5 % — ABNORMAL HIGH (ref 11.5–15.5)
WBC: 10.6 10*3/uL — ABNORMAL HIGH (ref 4.0–10.5)
nRBC: 0 % (ref 0.0–0.2)
nRBC: 0 /100 WBC

## 2019-08-28 LAB — BASIC METABOLIC PANEL
Anion gap: 13 (ref 5–15)
BUN: 40 mg/dL — ABNORMAL HIGH (ref 8–23)
CO2: 36 mmol/L — ABNORMAL HIGH (ref 22–32)
Calcium: 7.8 mg/dL — ABNORMAL LOW (ref 8.9–10.3)
Chloride: 85 mmol/L — ABNORMAL LOW (ref 98–111)
Creatinine, Ser: 1.52 mg/dL — ABNORMAL HIGH (ref 0.61–1.24)
GFR calc Af Amer: 54 mL/min — ABNORMAL LOW (ref >60)
GFR calc non Af Amer: 46 mL/min — ABNORMAL LOW (ref >60)
Glucose, Bld: 136 mg/dL — ABNORMAL HIGH (ref 70–99)
Potassium: 3.4 mmol/L — ABNORMAL LOW (ref 3.5–5.1)
Sodium: 134 mmol/L — ABNORMAL LOW (ref 135–145)

## 2019-08-28 LAB — MAGNESIUM: Magnesium: 2.8 mg/dL — ABNORMAL HIGH (ref 1.7–2.4)

## 2019-08-28 LAB — PHOSPHORUS: Phosphorus: 3.1 mg/dL (ref 2.5–4.6)

## 2019-08-28 MED ORDER — POTASSIUM CHLORIDE CRYS ER 20 MEQ PO TBCR: 40.0000 meq | EXTENDED_RELEASE_TABLET | Freq: Two times a day (BID) | ORAL | 1 refills | Status: AC

## 2019-08-28 MED ORDER — DEXAMETHASONE 6 MG PO TABS: 6.0000 mg | ORAL_TABLET | Freq: Every day | ORAL | 0 refills | Status: AC

## 2019-08-28 MED ORDER — ASPIRIN 81 MG PO TBEC: 81.0000 mg | DELAYED_RELEASE_TABLET | Freq: Every day | ORAL | Status: DC

## 2019-08-28 MED ORDER — TORSEMIDE 20 MG PO TABS: 40.0000 mg | ORAL_TABLET | Freq: Two times a day (BID) | ORAL | 1 refills | Status: DC

## 2019-08-28 MED ORDER — LEVEMIR FLEXTOUCH 100 UNIT/ML ~~LOC~~ SOPN: 40.0000 [IU] | PEN_INJECTOR | Freq: Every day | SUBCUTANEOUS | 0 refills | Status: DC

## 2019-08-28 MED ORDER — NOVOLOG FLEXPEN 100 UNIT/ML ~~LOC~~ SOPN: 3.0000 [IU] | PEN_INJECTOR | Freq: Three times a day (TID) | SUBCUTANEOUS | 0 refills | Status: AC

## 2019-08-28 MED ORDER — SPIRONOLACTONE 25 MG PO TABS: 25.0000 mg | ORAL_TABLET | Freq: Every day | ORAL | 1 refills | Status: DC

## 2019-08-28 MED ORDER — ISOSORBIDE DINITRATE 30 MG PO TABS: 30.0000 mg | ORAL_TABLET | Freq: Every day | ORAL | 1 refills | Status: DC

## 2019-08-28 MED ORDER — POTASSIUM CHLORIDE CRYS ER 20 MEQ PO TBCR
40.0000 meq | EXTENDED_RELEASE_TABLET | Freq: Once | ORAL | Status: AC
  Administered 2019-08-28: 40 meq via ORAL
  Filled 2019-08-28: qty 2

## 2019-08-28 NOTE — Discharge Summary (Addendum)
Physician Discharge Summary  [name] [name]:[id number] DOB: August 08, 1951 DOA: 08/25/2019  PCP: Kathyrn Drown, MD  Admit date: 08/25/2019 Discharge date: 08/28/2019  Time spent: 60 minutes  Recommendations for Outpatient Follow-up:  1. Follow-up with Kathyrn Drown, MD in 2 weeks.  On follow-up patient will need a basic metabolic profile done to follow-up on electrolytes and renal function as well as a magnesium level.  Patient's Covid will need to be reassessed on follow-up.  Patient's diabetes will need to be followed up upon. 2. Follow-up with cardiology, Dr. Harl Bowie and Dr. Burt Knack.  Patient will need a basic metabolic profile, magnesium level checked on follow-up. 3. Follow-up with electrophysiology.   Discharge Diagnoses:  Principal Problem:   VT (ventricular tachycardia) (HCC) Active Problems:   Hyperlipemia   Class 2 severe obesity due to excess calories with serious comorbidity and body mass index (BMI) of 39.0 to 39.9 in adult [facility])   Chronic combined systolic and diastolic CHF (congestive heart failure) (HCC)   Type 2 diabetes with nephropathy (HCC)   Essential hypertension   Hypokalemia   S/P TAVR (transcatheter aortic valve replacement)   PAF (paroxysmal atrial fibrillation) ([place])   Ventricular tachycardia ([place])   Dehydration   Discharge Condition: Stable and improved  Diet recommendation: Heart healthy  Filed Weights   08/25/19 1707 08/27/19 0437  Weight: 114.8 kg 115.9 kg    History of present illness:  HPI per Dr. Gwynne Edinger is a 68yo male with a past medical history significant for but not limited to diabetes, hypertension, hyperlipidemia, CAD (CABG x6 2001) VHD-post TAVR 2019, LBBB, paroxysmal atrial fibrillation, obesity, ICM, systolic congestive heart failure presence of ICD who presented with chief complaint of of severe fatigue, nausea, and questionable home defibrillation by his ICD.  Patient primary cardiology team notified of episode of V.  tach approximately 1515 today at which time patient reports a syncopal episode.  Primary cardiologist office recommended patient to go to the ED for further evaluation.   At approximately 1735 patient experienced another episode of V. Tach  For which he was shocked per ED physician. Cardiology consulted who recommended transfer to [facility] for admission.   Patient is incidentally Covid positive who  complainis that he had felt poorly for about 2 days without fever chills cough or sputum production, and had been in bed for most of that time not diligent about taking his medications.  He felt his defibrillator go off and was found wedged between his nightstand and his bed.  He currently is awake alert oriented although says he feels weak.  His potassium at time of evaluation in any [facility] was less than 2.0.  He is receiving 30 mEq of potassium. He seems to be very diligent about his Lasix takes close to 100 mg a day in split doses along with Zaroxolyn.  I am not clear from talking with him whether or not he takes his spironolactone or any potassium replacement.  Patient also endorses frequent diarrhea and shortness of breath over the last several days. He currently is in atrial fibrillation which from chart review seems to be his baseline.  He has no complaints of chest pain.  He does take hydrocodone 10 mg 2-3 times a day.   Hospital Course:  1 recurrent V. tach status post ICD firing Patient noted to have 2 episodes of V. tach on 08/25/2019 with a syncopal/apneic episodes one witnessed in the ED.  Patient also noted to be severely hypokalemic on admission with  a potassium level of < 2.  Patient was followed by cardiology and EP.  Diuretics were held initially during the hospitalization.  Patient's electrolytes were repleted with goal potassium to be greater than 4 and magnesium greater than 2.  Patient was insistent on being discharged.  On day of discharge patient's potassium was 3.4 and patient received a  dose of oral K. Dur 40 mEq p.o. x1 prior to discharge.  Per cardiology patient's baseline EKG with a left bundle branch block.  Outpatient follow-up with cardiology and electrophysiology.   2.  Chronic systolic heart failure Remained euvolemic during the hospitalization.  Patient diuretics were initially held as patient had presented with severe hypokalemia with recurrent V. tach status post ICD firing.  Patient was seen by cardiology and followed by cardiology throughout the hospitalization.  Patient's Aldactone was resumed the day prior to discharge and patient will be discharged home on Aldactone 25 mg daily, Demadex 40 mg twice daily, Isordil 30 mg daily, K. Dur 40 mEq twice daily per cardiology recommendations.  Outpatient follow-up with cardiology.   3.  Covid pneumonia Patient improved clinically during the hospitalization.  Patient was placed on IV remdesivir as well as IV Decadron during the hospitalization.  Patient received 3 days of IV remdesivir as well as IV Decadron.  Patient was adamant on being discharged home.  O2 sats on day of discharge were sent on room air at rest and 93% on ambulation.  Outpatient IV remdesivir infusion was ordered however unable to be done as infusion clinic will be closed on January 1, second, third and will not reopen till the fourth.  Patient be discharged home on 7 days of oral Decadron to complete a 10-day course of steroid treatment.  Outpatient follow-up with PCP.   4.  Hyperlipidemia Patient maintained on home regimen statin.   5.  Severe hypokalemia Patient had presented with V. tach and a syncopal episode noted to be in severe hypokalemia with a potassium of <2.  Patient's potassium was repleted as well as his magnesium.  Patient was very insistent on being discharged home.  On day of discharge patient's potassium was 3.4 however patient was given oral potassium 40 mEq p.o. x1 prior to discharge.  Patient will be discharged home on K. Dur 40 mEq  twice daily per cardiology recommendations.  Outpatient follow-up.   6.  Paroxysmal atrial fibrillation/status post TAVR 2019 Remained stable.  Patient maintained on Coreg for rate control and Xarelto for anticoagulation.  Patient was followed by cardiology throughout the hospitalization.  Outpatient follow-up with EP and cardiology.    7.  Diabetes mellitus type 2 Hemoglobin A1c 7.7.  Patient was maintained on Levemir at 20 units twice daily as well as sliding scale insulin.  Patient be discharged home on Levemir 40 units nightly as well as NovoLog 3 units 3 times daily with meals.  Outpatient follow-up with PCP.    8.  Acute kidney injury Likely in the setting of diuretics and dehydration.  Improved with holding diuretics and gentle hydration.  Outpatient follow-up.Marland Kitchen  9.  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  Continue beta-blocker and aspirin.  Patient started on nitrates.  Per cardiology may increase to 10 mg 3 times daily of Isordil and on discharge recommended patient be discharged home on Isordil 30 mg daily which patient will be discharged home on.  Outpatient follow-up with cardiology and electrophysiology.     Procedures:  Chest x-ray 08/25/2019  Consultations:  Cardiology: Dr.  Nishan 08/26/2019  EP   Discharge Exam: Vitals:   08/28/19 0956 08/28/19 1201  BP: 115/85 105/76  Pulse: 86 80  Resp:  17  Temp:  97.7 F (36.5 C)  SpO2:  (!) 87%    General: NAD Cardiovascular: RRR. 1-2+ bilateral lower extremity edema Respiratory: Clear to auscultation bilaterally.  Discharge Instructions   Discharge Instructions    Diet - low sodium heart healthy   Complete by: As directed    Discharge instructions   Complete by: As directed    ?   Person Under Monitoring Name: [name] [name]  Location: [address] [name] [address]   Infection Prevention Recommendations for Individuals Confirmed to have, or Being Evaluated for, 2019 Novel  Coronavirus (COVID-19) Infection Who Receive Care at Home  Individuals who are confirmed to have, or are being evaluated for, COVID-19 should follow the prevention steps below until a healthcare provider or local or state health department says they can return to normal activities.  Stay home except to get medical care You should restrict activities outside your home, except for getting medical care. Do not go to work, school, or public areas, and do not use public transportation or taxis.  Call ahead before visiting your doctor Before your medical appointment, call the healthcare provider and tell them that you have, or are being evaluated for, COVID-19 infection. This will help the healthcare provider's office take steps to keep other people from getting infected. Ask your healthcare provider to call the local or state health department.  Monitor your symptoms Seek prompt medical attention if your illness is worsening (e.g., difficulty breathing). Before going to your medical appointment, call the healthcare provider and tell them that you have, or are being evaluated for, COVID-19 infection. Ask your healthcare provider to call the local or state health department.  Wear a facemask You should wear a facemask that covers your nose and mouth when you are in the same room with other people and when you visit a healthcare provider. People who live with or visit you should also wear a facemask while they are in the same room with you.  Separate yourself from other people in your home As much as possible, you should stay in a different room from other people in your home. Also, you should use a separate bathroom, if available.  Avoid sharing household items You should not share dishes, drinking glasses, cups, eating utensils, towels, bedding, or other items with other people in your home. After using these items, you should wash them thoroughly with soap and water.  Cover your coughs  and sneezes Cover your mouth and nose with a tissue when you cough or sneeze, or you can cough or sneeze into your sleeve. Throw used tissues in a lined trash can, and immediately wash your hands with soap and water for at least 20 seconds or use an alcohol-based hand rub.  Wash your [facility] your hands often and thoroughly with soap and water for at least 20 seconds. You can use an alcohol-based hand sanitizer if soap and water are not available and if your hands are not visibly dirty. Avoid touching your eyes, nose, and mouth with unwashed hands.   Prevention Steps for Caregivers and Household Members of Individuals Confirmed to have, or Being Evaluated for, COVID-19 Infection Being Cared for in the Home  If you live with, or provide care at home for, a person confirmed to have, or being evaluated for, COVID-19 infection please follow  these guidelines to prevent infection:  Follow healthcare provider's instructions Make sure that you understand and can help the patient follow any healthcare provider instructions for all care.  Provide for the patient's basic needs You should help the patient with basic needs in the home and provide support for getting groceries, prescriptions, and other personal needs.  Monitor the patient's symptoms If they are getting sicker, call his or her medical provider and tell them that the patient has, or is being evaluated for, COVID-19 infection. This will help the healthcare provider's office take steps to keep other people from getting infected. Ask the healthcare provider to call the local or state health department.  Limit the number of people who have contact with the patient If possible, have only one caregiver for the patient. Other household members should stay in another home or place of residence. If this is not possible, they should stay in another room, or be separated from the patient as much as possible. Use a separate bathroom, if  available. Restrict visitors who do not have an essential need to be in the home.  Keep older adults, very young children, and other sick people away from the patient Keep older adults, very young children, and those who have compromised immune systems or chronic health conditions away from the patient. This includes people with chronic heart, lung, or kidney conditions, diabetes, and cancer.  Ensure good ventilation Make sure that shared spaces in the home have good air flow, such as from an air conditioner or an opened window, weather permitting.  Wash your hands often Wash your hands often and thoroughly with soap and water for at least 20 seconds. You can use an alcohol based hand sanitizer if soap and water are not available and if your hands are not visibly dirty. Avoid touching your eyes, nose, and mouth with unwashed hands. Use disposable paper towels to dry your hands. If not available, use dedicated cloth towels and replace them when they become wet.  Wear a facemask and gloves Wear a disposable facemask at all times in the room and gloves when you touch or have contact with the patient's blood, body fluids, and/or secretions or excretions, such as sweat, saliva, sputum, nasal mucus, vomit, urine, or feces.  Ensure the mask fits over your nose and mouth tightly, and do not touch it during use. Throw out disposable facemasks and gloves after using them. Do not reuse. Wash your hands immediately after removing your facemask and gloves. If your personal clothing becomes contaminated, carefully remove clothing and launder. Wash your hands after handling contaminated clothing. Place all used disposable facemasks, gloves, and other waste in a lined container before disposing them with other household waste. Remove gloves and wash your hands immediately after handling these items.  Do not share dishes, glasses, or other household items with the patient Avoid sharing household items. You  should not share dishes, drinking glasses, cups, eating utensils, towels, bedding, or other items with a patient who is confirmed to have, or being evaluated for, COVID-19 infection. After the person uses these items, you should wash them thoroughly with soap and water.  Wash laundry thoroughly Immediately remove and wash clothes or bedding that have blood, body fluids, and/or secretions or excretions, such as sweat, saliva, sputum, nasal mucus, vomit, urine, or feces, on them. Wear gloves when handling laundry from the patient. Read and follow directions on labels of laundry or clothing items and detergent. In general, wash and dry with the warmest  temperatures recommended on the label.  Clean all areas the individual has used often Clean all touchable surfaces, such as counters, tabletops, doorknobs, bathroom fixtures, toilets, phones, keyboards, tablets, and bedside tables, every day. Also, clean any surfaces that may have blood, body fluids, and/or secretions or excretions on them. Wear gloves when cleaning surfaces the patient has come in contact with. Use a diluted bleach solution (e.g., dilute bleach with 1 part bleach and 10 parts water) or a household disinfectant with a label that says EPA-registered for coronaviruses. To make a bleach solution at home, add 1 tablespoon of bleach to 1 quart (4 cups) of water. For a larger supply, add  cup of bleach to 1 gallon (16 cups) of water. Read labels of cleaning products and follow recommendations provided on product labels. Labels contain instructions for safe and effective use of the cleaning product including precautions you should take when applying the product, such as wearing gloves or eye protection and making sure you have good ventilation during use of the product. Remove gloves and wash hands immediately after cleaning.  Monitor yourself for signs and symptoms of illness Caregivers and household members are considered close contacts,  should monitor their health, and will be asked to limit movement outside of the home to the extent possible. Follow the monitoring steps for close contacts listed on the symptom monitoring form.   ? If you have additional questions, contact your local health department or call the epidemiologist on call at [phone] (available 24/7). ? This guidance is subject to change. For the most up-to-date guidance from [facility], please refer to their website: YouBlogs.pl   Increase activity slowly   Complete by: As directed      Allergies as of 08/28/2019      Reactions   Dyflex-g [dyphylline-guaifenesin] Hives   Eliquis [apixaban] Other (See Comments)   Side effects not a true allergy- he stated that it made him feel bad      Medication List    STOP taking these medications   Na Sulfate-K Sulfate-Mg Sulf 17.5-3.13-1.6 GM/177ML Soln     TAKE these medications   acetaminophen 500 MG tablet Commonly known as: TYLENOL Take 1 tablet (500 mg total) by mouth 2 (two) times daily. What changed:   how much to take  when to take this  reasons to take this   aspirin 81 MG EC tablet Take 1 tablet (81 mg total) by mouth daily. Start taking on: August 29, 2019   blood glucose meter kit and supplies Kit Dispense based on patient and insurance preference. Test  up to four times daily as directed.Dx: E11.9   calcium carbonate 500 MG chewable tablet Commonly known as: TUMS - dosed in mg elemental calcium Chew 2 tablets by mouth 2 (two) times daily as needed for indigestion or heartburn.   carvedilol 3.125 MG tablet Commonly known as: COREG TAKE (1) TABLET BY MOUTH TWICE DAILY WITH A MEAL. What changed: See the new instructions.   CENTRUM SILVER PO Take 1 tablet by mouth daily.   dexamethasone 6 MG tablet Commonly known as: DECADRON Take 1 tablet (6 mg total) by mouth daily for 7 days. Start taking on: August 29, 2019    FIBER PO Take 5 capsules by mouth daily.   GoodSense Muscle Rub 8-30 % Crea Apply 1 application topically daily as needed (muscle pain).   HYDROcodone-acetaminophen 10-325 MG tablet Commonly known as: NORCO Take one tablet po twice daily prn pain What changed:   how much  to take  how to take this  when to take this  reasons to take this  additional instructions   isosorbide dinitrate 30 MG tablet Commonly known as: ISORDIL Take 1 tablet (30 mg total) by mouth daily.   Levemir FlexTouch 100 UNIT/ML Pen Generic drug: Insulin Detemir Inject 40 Units into the skin at bedtime. What changed: how much to take   Lidocaine Plus 4 % Crea Generic drug: Lidocaine HCl Apply 1 application topically daily as needed (itching).   linaclotide 290 MCG Caps capsule Commonly known as: LINZESS Take 1 capsule (290 mcg total) by mouth daily before breakfast.   liver oil-zinc oxide 40 % ointment Commonly known as: DESITIN Apply 1 application topically as needed for irritation.   metolazone 2.5 MG tablet Commonly known as: ZAROXOLYN TAKE ONE TABLET BY MOUTH DAILY. What changed:   when to take this  additional instructions   nitroGLYCERIN 0.4 MG SL tablet Commonly known as: NITROSTAT PLACE 1 TABLET UNDER TONGUE FOR CHEST PAIN. MAY REPEAT EVERY 5 MIN UP TO 3 DOSES-NO RELIEF,CALL 911. What changed: See the new instructions.   NovoLOG FlexPen 100 UNIT/ML FlexPen Generic drug: insulin aspart Inject 3 Units into the skin 3 (three) times daily with meals. What changed:   how much to take  when to take this   pantoprazole 40 MG tablet Commonly known as: Protonix Take 1 tablet (40 mg total) by mouth 2 (two) times daily. What changed:   when to take this  reasons to take this   polycarbophil 625 MG tablet Commonly known as: FIBERCON Take 2 tablets (1,250 mg total) by mouth daily with lunch. Hold if you have diarrhea   potassium chloride SA 20 MEQ tablet Commonly known as:  Klor-Con M20 Take 2 tablets (40 mEq total) by mouth 2 (two) times daily. What changed:   how much to take  how to take this  when to take this  additional instructions   rivaroxaban 20 MG Tabs tablet Commonly known as: Xarelto Take 1 tablet (20 mg total) by mouth daily with supper.   rosuvastatin 40 MG tablet Commonly known as: CRESTOR TAKE ONE TABLET BY MOUTH DAILY.   spironolactone 25 MG tablet Commonly known as: ALDACTONE Take 1 tablet (25 mg total) by mouth daily. What changed: when to take this   Sure Comfort Pen Needles 31G X 8 MM Misc Generic drug: Insulin Pen Needle USING 4 TIMES DAILY.   torsemide 20 MG tablet Commonly known as: DEMADEX Take 2 tablets (40 mg total) by mouth 2 (two) times daily.   traMADol 50 MG tablet Commonly known as: ULTRAM TAKE 1 TABLET BY MOUTH EVERY 6 TO 8 HOURS AS NEEDED FOR HEADACHE. What changed:   how much to take  how to take this  when to take this  reasons to take this  additional instructions   trolamine salicylate 10 % cream Commonly known as: ASPERCREME Apply 1 application topically 2 (two) times daily as needed for muscle pain.      Allergies  Allergen Reactions  . Dyflex-G [Dyphylline-Guaifenesin] Hives  . Eliquis [Apixaban] Other (See Comments)    Side effects not a true allergy- he stated that it made him feel bad   Follow-up Information    Luking, Elayne Snare, MD. Schedule an appointment as soon as possible for a visit in 2 week(s).   Specialty: Family Medicine Contact information: [place] [address] [phone]        Constance Haw,  MD .   Specialty: Cardiology Contact information: [address] [place] [address] [phone]        Arnoldo Lenis, MD .   Specialty: Cardiology Contact information: [address] [place] [address] [phone]        Sherren Mocha, MD .   Specialty: Cardiology Contact information: [address] [name] [address] [phone]            The results of significant diagnostics from this hospitalization (including imaging, microbiology, ancillary and laboratory) are listed below for reference.    Significant Diagnostic Studies: DG Chest Port 1 View  Result Date: 08/25/2019 CLINICAL DATA:  Chest pain and shortness of breath EXAM: PORTABLE CHEST 1 VIEW COMPARISON:  November 20, 2017 FINDINGS: There is airspace consolidation throughout portions of the right mid and lower lung zones as well as in the left base. There is cardiomegaly. Pulmonary vascularity is within normal limits. Pacemaker lead is attached to the right ventricle. Patient is status post coronary artery bypass grafting. Status post aortic valve replacement. No adenopathy. No bone lesions. IMPRESSION: Areas of airspace opacity in the right mid lung and both bases, more on the right than on the left, findings consistent with multifocal pneumonia. Cardiomegaly. Pacemaker lead attached to right ventricle. Status post coronary artery bypass grafting. Aortic valve replacement noted. No adenopathy demonstrable. Electronically Signed   By: Lowella Grip III M.D.   On: 08/25/2019 18:26    Microbiology: Recent Results (from the past 240 hour(s))  Respiratory Panel by RT PCR (Flu A&B, Covid) - Nasopharyngeal Swab     Status: Abnormal   Collection Time: 08/25/19  6:29 PM   Specimen: Nasopharyngeal Swab  Result Value Ref Range Status   SARS Coronavirus 2 by RT PCR POSITIVE (A) NEGATIVE Final    Comment: RESULT CALLED TO, READ BACK BY AND VERIFIED WITH: NICHOLS,K. AT 2015 ON 08/25/2019 BY EVA (NOTE) SARS-CoV-2 target nucleic acids are DETECTED. SARS-CoV-2 RNA is generally detectable in upper respiratory specimens  during the acute phase of infection. Positive results are indicative of the presence of the identified virus, but do not rule out bacterial infection or co-infection with other pathogens  not detected by the test. Clinical correlation with patient history and other diagnostic information is necessary to determine patient infection status. The expected result is Negative. Fact Sheet for Patients:  PinkCheek.be Fact Sheet for Healthcare Providers: GravelBags.it This test is not yet approved or cleared by the [place] FDA and  has been authorized for detection and/or diagnosis of SARS-CoV-2 by FDA under an Emergency Use Authorization (EUA).  This EUA will remain in effect (meaning this test can be use d) for the duration of  the COVID-19 declaration under Section 564(b)(1) of the Act, 21 U.S.C. section 360bbb-3(b)(1), unless the authorization is terminated or revoked sooner.    Influenza A by PCR NEGATIVE NEGATIVE Final   Influenza B by PCR NEGATIVE NEGATIVE Final    Comment: (NOTE) The Xpert Xpress SARS-CoV-2/FLU/RSV assay is intended as an aid in  the diagnosis of influenza from Nasopharyngeal swab specimens and  should not be used as a sole basis for treatment. Nasal washings and  aspirates are unacceptable for Xpert Xpress SARS-CoV-2/FLU/RSV  testing. Fact Sheet for Patients: PinkCheek.be Fact Sheet for Healthcare Providers: GravelBags.it This test is not yet approved or cleared by the [place] FDA and  has been authorized for detection and/or diagnosis of SARS-CoV-2 by  FDA under an  Emergency Use Authorization (EUA). This EUA will remain  in effect (meaning this test can be used) for the duration of the  Covid-19 declaration under Section 564(b)(1) of the Act, 21  U.S.C. section 360bbb-3(b)(1), unless the authorization is  terminated or revoked. Performed at [facility], [address], [place], [place] [address]   MRSA PCR Screening     Status: None   Collection Time: 08/26/19  5:08 AM   Specimen: Nasal Mucosa; Nasopharyngeal  Result  Value Ref Range Status   MRSA by PCR NEGATIVE NEGATIVE Final    Comment:        The GeneXpert MRSA Assay (FDA approved for NASAL specimens only), is one component of a comprehensive MRSA colonization surveillance program. It is not intended to diagnose MRSA infection nor to guide or monitor treatment for MRSA infections. Performed at [facility], [place] [address] [place], [place] [address]      Labs: Basic Metabolic Panel: Recent Labs  Lab 08/25/19 1708 08/26/19 0754 08/26/19 1443 08/26/19 2100 08/27/19 0323 08/27/19 0500 08/27/19 1808 08/28/19 0655  NA 135 137  --  135 135  --  133* 134*  K <2.0* 2.2* 2.5* 2.8* 2.8*  --  4.2 3.4*  CL 74* 78*  --  83* 83*  --  85* 85*  CO2 41* 37*  --  39* 35*  --  31 36*  GLUCOSE 178* 142*  --  229* 179*  --  231* 136*  BUN 33* 33*  --  31* 30*  --  37* 40*  CREATININE 1.28* 1.46*  --  1.37* 1.25*  --  1.41* 1.52*  CALCIUM 7.4* 7.5*  --  7.1* 7.2*  --  7.3* 7.8*  MG 1.9 2.2  --   --   --  2.2  --  2.8*  PHOS  --  2.5  --   --  1.7*  --   --  3.1   Liver Function Tests: Recent Labs  Lab 08/25/19 1708 08/26/19 0754 08/27/19 0323  AST 40  --   --   ALT 16  --   --   ALKPHOS 91  --   --   BILITOT 3.7*  --   --   PROT 7.5  --   --   ALBUMIN 3.1* 2.7* 2.7*   No results for input(s): LIPASE, AMYLASE in the last 168 hours. No results for input(s): AMMONIA in the last 168 hours. CBC: Recent Labs  Lab 08/25/19 1708 08/26/19 0754 08/28/19 0655  WBC 5.7 6.4 10.6*  NEUTROABS 4.7  --  9.4*  HGB 14.7 13.7 14.5  HCT 45.2 41.2 43.7  MCV 96.8 94.3 96.7  PLT 194 197 296   Cardiac Enzymes: No results for input(s): CKTOTAL, CKMB, CKMBINDEX, TROPONINI in the last 168 hours. BNP: BNP (last 3 results) Recent Labs    09/12/18 0901  BNP 477.4*    ProBNP (last 3 results) No results for input(s): PROBNP in the last 8760 hours.  CBG: Recent Labs  Lab 08/27/19 2052 08/28/19 0030 08/28/19 0401 08/28/19 0746 08/28/19 1147   GLUCAP 253* 180* 185* 136* 188*       Signed:  Irine Seal MD.  [facility] 08/28/2019, 2:54 PM

## 2019-08-28 NOTE — Progress Notes (Signed)
Patient not seen in person today Doing well with no angina since on isordil K improved to 3.4 Would d/c with aldactone 25 and demedex 40 in am and 40 mg in PM D/C with Kdur 40 mEq am and 40 mEq pm Change isordil to imdur 30 mg PO daily   Will arrange f/u with cardiology in 10 days or so after appropriate quarantine period    [name]

## 2019-08-28 NOTE — TOC Transition Note (Signed)
Transition of Care [facility]) - CM/SW Discharge Note   Patient Details  Name: [name] [name] MRN: [id number] Date of Birth: Nov 30, 1950  Transition of [facility]) CM/SW Contact:  Pollie Friar, RN Phone Number: 08/28/2019, 2:13 PM   Clinical Narrative:    Pt is discharging home with self care. MD placed orders for home oxygen but the patient did not qualify. MD updated.  Pt has hospital f/u and transportation home.    Final next level of care: Home/Self Care Barriers to Discharge: No Barriers Identified   Patient Goals and CMS Choice        Discharge Placement                       Discharge Plan and Services                                     Social Determinants of Health (SDOH) Interventions     Readmission Risk Interventions No flowsheet data found.

## 2019-08-28 NOTE — Progress Notes (Signed)
SATURATION QUALIFICATIONS: (This note is used to comply with regulatory documentation for home oxygen)  Patient Saturations on Room Air at Rest = 96 %  Patient Saturations on Room Air while Ambulating = 93%   Patient ambulated down the hall without oxygen oxygen sat 93% on room air, no complaints of SOB or discomfort.

## 2019-08-28 NOTE — Progress Notes (Signed)
[name] to be D/C'd Home per MD order.  Discussed with the patient and all questions fully answered.  VSS, Skin clean, dry and intact without evidence of skin break down, no evidence of skin tears noted. IV catheter discontinued intact. Site without signs and symptoms of complications. Dressing and pressure applied.  An After Visit Summary was printed and given to the patient. Patient received prescription.  D/c education completed with patient/family including follow up instructions, medication list, d/c activities limitations if indicated, with other d/c instructions as indicated by MD - patient able to verbalize understanding, all questions fully answered.   Patient instructed to return to ED, call 911, or call MD for any changes in condition.   Patient escorted via [place], and D/C home via private auto.   [name] [facility] 08/28/2019 3:27 PM

## 2019-08-29 ENCOUNTER — Other Ambulatory Visit: Payer: Self-pay | Admitting: Family Medicine

## 2019-09-01 ENCOUNTER — Other Ambulatory Visit: Payer: Self-pay | Admitting: *Deleted

## 2019-09-01 ENCOUNTER — Ambulatory Visit (INDEPENDENT_AMBULATORY_CARE_PROVIDER_SITE_OTHER): Payer: Medicare Other | Admitting: Family Medicine

## 2019-09-01 ENCOUNTER — Other Ambulatory Visit: Payer: Self-pay

## 2019-09-01 ENCOUNTER — Telehealth: Payer: Self-pay | Admitting: *Deleted

## 2019-09-01 ENCOUNTER — Telehealth: Payer: Self-pay | Admitting: Family Medicine

## 2019-09-01 DIAGNOSIS — J1282 Pneumonia due to coronavirus disease 2019: Secondary | ICD-10-CM

## 2019-09-01 DIAGNOSIS — I472 Ventricular tachycardia, unspecified: Secondary | ICD-10-CM

## 2019-09-01 DIAGNOSIS — E876 Hypokalemia: Secondary | ICD-10-CM

## 2019-09-01 DIAGNOSIS — N1831 Chronic kidney disease, stage 3a: Secondary | ICD-10-CM | POA: Diagnosis not present

## 2019-09-01 DIAGNOSIS — R3916 Straining to void: Secondary | ICD-10-CM

## 2019-09-01 DIAGNOSIS — N401 Enlarged prostate with lower urinary tract symptoms: Secondary | ICD-10-CM | POA: Diagnosis not present

## 2019-09-01 MED ORDER — LINACLOTIDE 290 MCG PO CAPS: ORAL_CAPSULE | ORAL | 2 refills | Status: DC

## 2019-09-01 MED ORDER — PANTOPRAZOLE SODIUM 40 MG PO TBEC: 40.0000 mg | DELAYED_RELEASE_TABLET | Freq: Two times a day (BID) | ORAL | 3 refills | Status: DC

## 2019-09-01 MED ORDER — TAMSULOSIN HCL 0.4 MG PO CAPS: 0.4000 mg | ORAL_CAPSULE | Freq: Every day | ORAL | 5 refills | Status: AC

## 2019-09-01 MED ORDER — CEPHALEXIN 500 MG PO CAPS: 500.0000 mg | ORAL_CAPSULE | Freq: Four times a day (QID) | ORAL | 0 refills | Status: DC

## 2019-09-01 NOTE — Addendum Note (Signed)
Addended by: Vicente Males on: 09/01/2019 09:15 AM   Modules accepted: Orders

## 2019-09-01 NOTE — Telephone Encounter (Signed)
Nurses Patient was discharged on 1231 Diagnosis of V. tach, hypokalemia, positive Covid pneumonia It was recommended for the patient to follow-up in 2 weeks Patient will need to follow-up either in January 14 or 15 Patient will need to do metabolic 7 and magnesium a day or 2 before follow-up

## 2019-09-01 NOTE — Progress Notes (Signed)
   Subjective:    Patient ID: [name] [name], male    DOB: 11/12/1950, 69 y.o.   MRN: [id number]  HPI  Patient calls for a follow up after recent hospitalization. Patient states he is still feeling pretty weak. Patient recently admitted into the hospital due to V. tach and his defibrillator went off also had low potassium in addition to this also had positive Covid and also had pneumonia got 3 days of antiviral treatment and then pretty much wanted to go home they released him.  Patient overall doing fairly well he relates he feels weak and rundown denies wheezing or difficulty breathing.  Denies diarrhea vomiting denies chest pain or shortness of breath Virtual Visit via Video Note  I connected with [name] [name] on 09/01/19 at  2:00 PM EST by a video enabled telemedicine application and verified that I am speaking with the correct person using two identifiers.  Location: Patient: home Provider: office   I discussed the limitations of evaluation and management by telemedicine and the availability of in person appointments. The patient expressed understanding and agreed to proceed.  History of Present Illness:    Observations/Objective:   Assessment and Plan:   Follow Up Instructions:    I discussed the assessment and treatment plan with the patient. The patient was provided an opportunity to ask questions and all were answered. The patient agreed with the plan and demonstrated an understanding of the instructions.   The patient was advised to call back or seek an in-person evaluation if the symptoms worsen or if the condition fails to improve as anticipated.  I provided 17 minutes of non-face-to-face time during this encounter.    Patient relates weak flow with urine denies dysuria denies fever chills sweats   Review of Systems  Constitutional: Positive for fatigue. Negative for diaphoresis.  HENT: Negative for congestion and rhinorrhea.   Respiratory: Positive for  cough. Negative for shortness of breath.   Cardiovascular: Negative for chest pain and leg swelling.  Gastrointestinal: Negative for abdominal pain and diarrhea.  Genitourinary: Positive for difficulty urinating. Negative for dysuria.  Skin: Negative for color change and rash.  Neurological: Negative for dizziness and headaches.  Psychiatric/Behavioral: Negative for behavioral problems and confusion.       Objective:   Physical Exam  Today's visit was via telephone Physical exam was not possible for this visit  I talked with patient at length about his viral pneumonia I encouraged him that if he starts feeling more short of breath or difficulty breathing to immediately go to the ER and to follow-up if any progressive troubles or if worse     Assessment & Plan:  1. Benign prostatic hyperplasia (BPH) with straining on urination Go ahead with Flomax on a daily basis.  2. Stage 3a chronic kidney disease Continue diuretic as laid out by the cardiologist follow-up with cardiology as planned  3. Hypokalemia Recheck lab work await the results of this I did send in an antibiotic to cover for this pneumonia but the likelihood that it is bacterial is low.

## 2019-09-01 NOTE — Patient Outreach (Signed)
Red EMMI flag received for Day #1 08/30/19 general discharge, Scheduled follow up- no, Unfilled prescriptions- yes.  Outreach call to pt to address, spoke with pt, HIPAA verified, pt reports he does have follow up appointment with primary care provider today and states he does not have linzess "and another medicine, I can't remember the name and my wife is not here"  Pt states there was an issue with the pharmacy faxing the MD and hearing back from them.  Pt states he/ wife are going to request the needed prescriptions today at MD appointment and have them filled.  RN CM ask pt to call back if he has any issues with obtaining medications or needs any assistance.  Pt verbalizes understanding.   Jacqlyn Larsen [facility], [place] Coordinator [phone]

## 2019-09-01 NOTE — Telephone Encounter (Signed)
Dr Hilarie Fredrickson-  Please see original notes below... I was going to contact patient to attempt to place him on our hospital procedure schedule for 09/30/19, however, he has again been back in the hospital with multiple medical issues (cardiac, electrolyte imbalance-specifically potassium, and COVID). At this point, I am unsure if he would be able to come off anticoagulant for procedure or prep for procedure without having major electrolyte imbalance. Advice/recommendations? Office visit?  Result Notes  Larina Bras, [facility]  08/13/2019 11:52 AM EST    I have spoken to patient to advise that unfortunately, due to rise in COVID-19 cases, hospital times are extremely limited right now which may delay scheduling of his endoscopy/colonoscopy at the hospital. I have placed him on our wait list and we will be in touch with him to schedule procedure as soon as a time/date becomes available. He verbalizes understanding of this.   Vicente Males, LPN  09/32/3557 3:22 PM EST    Pt contacted and verbalized understanding   Kathyrn Drown, MD  08/08/2019 8:55 AM EST    Patient's potassium back to normal. He has been informed that we were passing a message on to gastroenterology and they can help reset him up for colonoscopy thanks   Kathyrn Drown, MD  08/08/2019 8:54 AM EST    Please let the patient know that his potassium is back to normal. We will also send a message to his gastroenterologist that his potassium is doing well they will connect with him to help reset up colonoscopy. (Nurses patient when previous colonoscopy was canceled because of low potassium was not too thrilled)

## 2019-09-01 NOTE — Telephone Encounter (Signed)
Pt scheduled for follow up w/ Dr. Curt Bears on 1/21.  Dr. Curt Bears aware and agreeable.

## 2019-09-01 NOTE — Telephone Encounter (Signed)
Lab orders placed; left message to return call  

## 2019-09-01 NOTE — Telephone Encounter (Signed)
Patient had hospital follow up today with Dr Nicki Reaper and will repeat his blood work in 2 weeks.

## 2019-09-01 NOTE — Telephone Encounter (Signed)
I do think office visit is preferred at this point, obviously greater than 10 days after complete resolution of COVID-19 symptoms

## 2019-09-02 ENCOUNTER — Telehealth: Payer: Self-pay | Admitting: Family Medicine

## 2019-09-02 ENCOUNTER — Telehealth: Payer: Self-pay | Admitting: Cardiology

## 2019-09-02 DIAGNOSIS — D509 Iron deficiency anemia, unspecified: Secondary | ICD-10-CM

## 2019-09-02 NOTE — Addendum Note (Signed)
Addended by: Vicente Males on: 09/02/2019 01:19 PM   Modules accepted: Orders

## 2019-09-02 NOTE — Telephone Encounter (Signed)
Pt wife states she would like lab work to check ferritin. Lab orders placed and pt wife is aware.

## 2019-09-02 NOTE — Telephone Encounter (Signed)
Pt's wife calling in making sure the cephalexin is going to help pt urinate. She states he is not able to and she would also like to make sure he has lab work to get his iron checked.

## 2019-09-02 NOTE — Telephone Encounter (Signed)
Please inform Santiago Glad on his most recent stay his hemoglobin was very good We were planning on rechecking the kidney functions sodium potassium etc. I did not order a CBC at that time because his hemoglobin was perfectly normal If she wants to recheck the CBC then we will have to redo the orders add a CBC and a ferritin See what she would like to do and move forward

## 2019-09-02 NOTE — Telephone Encounter (Signed)
Keflex was to cover for the possibility of infection I sent in tamsulosin which is Flomax yesterday he should start on this 1 daily If he is still having dribbling or having low flow he should take the Flomax twice daily but it can cause dizziness or lightheadedness  Nurses find out Is he taking the tamsulosin daily? Has not helped so far? How is his urination currently? If the urination is not doing well he can double up on Flomax for now and give [place] feedback this week on if that is helping If his urination he will need to go to the ER so they can place a catheter

## 2019-09-02 NOTE — Telephone Encounter (Signed)

## 2019-09-02 NOTE — Telephone Encounter (Signed)
Pt wife states that patient is taking the Tamsulosin and it seems to be helping. Pt wife states that patient told her "it felt so good to go to the bathroom.". Pt wife states that pt urination has improved.

## 2019-09-02 NOTE — Telephone Encounter (Signed)
Please advise. Thank you

## 2019-09-05 NOTE — Telephone Encounter (Signed)
I will contact patient at a later time to discuss office visit with him.

## 2019-09-09 ENCOUNTER — Telehealth (INDEPENDENT_AMBULATORY_CARE_PROVIDER_SITE_OTHER): Payer: Medicare Other | Admitting: Cardiology

## 2019-09-09 ENCOUNTER — Encounter: Payer: Self-pay | Admitting: Cardiology

## 2019-09-09 VITALS — BP 90/59 | HR 89 | Ht 70.5 in | Wt 251.0 lb

## 2019-09-09 DIAGNOSIS — I480 Paroxysmal atrial fibrillation: Secondary | ICD-10-CM

## 2019-09-09 DIAGNOSIS — I2510 Atherosclerotic heart disease of native coronary artery without angina pectoris: Secondary | ICD-10-CM

## 2019-09-09 DIAGNOSIS — I472 Ventricular tachycardia, unspecified: Secondary | ICD-10-CM

## 2019-09-09 DIAGNOSIS — I5022 Chronic systolic (congestive) heart failure: Secondary | ICD-10-CM

## 2019-09-09 DIAGNOSIS — I10 Essential (primary) hypertension: Secondary | ICD-10-CM

## 2019-09-09 NOTE — Progress Notes (Signed)
Virtual Visit via Telephone Note   This visit type was conducted due to national recommendations for restrictions regarding the COVID-19 Pandemic (e.g. social distancing) in an effort to limit this patient's exposure and mitigate transmission in our community.  Due to his co-morbid illnesses, this patient is at least at moderate risk for complications without adequate follow up.  This format is felt to be most appropriate for this patient at this time.  The patient did not have access to video technology/had technical difficulties with video requiring transitioning to audio format only (telephone).  All issues noted in this document were discussed and addressed.  No physical exam could be performed with this format.  Please refer to the patient's chart for his  consent to telehealth for [name].   Date:  09/09/2019   ID:  [name], DOB 10-02-1950, MRN [id number]  Patient Location: Home Provider Location: Office  PCP:  Kathyrn Drown, MD  Cardiologist:  Carlyle Dolly, MD  Electrophysiologist:  Constance Haw, MD   Evaluation Performed:  Follow-Up Visit  Chief Complaint:  Follow up visit  History of Present Illness:    [name] [name] is a 69 y.o. male  seen today for follow up of the following medical problems.   1. Chronic systolic HF - history of ICM, LVEF 10-15% by echo Jan 2018. Restricitive diastolic dysfunction.  - ICD followed by EP  - 02/2019 echo :LVEF 15-20%, mod RV dysfunction, normal AVR  - no recent SOB or DOE. No recent edema - weight 251 lbs. Taking torsemide 22m bid. Takes metolazone 2.528mevery    2. CAD History of CABG in 2001(LIMA to distal LAD, SVG to RI, SVG to OM 2, sequential SVG to our PLA, SVG to acute marginal, SVG to posterior descending artery   09/2017 cath: patent grafts 4/4, severe 3 vessel native disease - denies any recent chest pain.      3. HTN - he is compliant with meds - some recent low bp's SBPs 90s,  asymptomatic. He attributes to decreased oral intake and being on pain meds from recent dental surgery  4. Hyperlipidemia -compliant with statin  02/2019 TC 133 TG 155 HDL 29 LDL 73  5. OSA - compliant with CPAP  6. Aortic stenosis - moderate by echo Jan 2018.  -no recent SOB/DOE,/chest pain/syncope  - s/p TAVR 10/2017 - 02/2019 echo :VEF 15-20%, mod RV dysfunction, normal AVR   7. PAF - incidental finding on device interogation - no symptoms. Compliant with xarelto.    8. VT - admitted 07/2019 with ICD shock and syncope in setting of COVID pneumonia and hypokalemia - no recurrence since discharge - pcp has ordered repeat labs including BMET   The patient does not have symptoms concerning for COVID-19 infection (fever, chills, cough, or new shortness of breath).    Past Medical History:  Diagnosis Date  . AICD (automatic cardioverter/defibrillator) present 05/18/2016  . Anginal pain ([place]   occ none recent  . Aortic stenosis   . Arthritis   . CAD (coronary artery disease)   . Cardiomyopathy, ischemic   . CHF (congestive heart failure) ([place]  . Chronic back pain   . Diabetes mellitus without complication ([place]  . Elevated PSA   . History of kidney stones   . Hyperlipidemia   . Morbid obesity ([place]  . Myocardial infarction ([place]  . OSA (obstructive sleep apnea)    does not wear CPAP, -order run out  told need to redo  test  . Paroxysmal atrial fibrillation ([place]) 09/29/2018   This was found on event monitor October 2019  . 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 APPENDECTOMY    . BIOPSY  10/02/2018   Procedure: BIOPSY;  Surgeon: Daneil Dolin, MD;  [facility];  Service: Endoscopy;;  gastric  . CARDIAC CATHETERIZATION     stents  . CAROTID STENT     denies carotid stent  . CATARACT EXTRACTION, BILATERAL    . CORONARY ARTERY BYPASS GRAFT  12/23/1999   LIMA to LAD, SVG to ramus, sequential SVG to OM2-RPL, sequential SVG to AM-PDA  . EP IMPLANTABLE DEVICE N/A 05/18/2016   Procedure: ICD Implant;  Surgeon: Will Meredith Leeds, MD;  Location: [place] CV LAB;  Service: Cardiovascular;  Laterality: N/A;  . ESOPHAGOGASTRODUODENOSCOPY (EGD) WITH PROPOFOL N/A 10/02/2018   Procedure: ESOPHAGOGASTRODUODENOSCOPY (EGD) WITH PROPOFOL;  Surgeon: Daneil Dolin, MD;  [facility];  Service: Endoscopy;  Laterality: N/A;  . EYE SURGERY    . ingrown toenail N/A   . LEFT HEART CATHETERIZATION WITH CORONARY/GRAFT ANGIOGRAM N/A 12/21/2014   Procedure: LEFT HEART CATHETERIZATION WITH Beatrix Fetters;  Surgeon: Belva Crome, MD;  Location: [facility] CATH LAB;  Service: Cardiovascular;  Laterality: N/A;  . NASAL SEPTOPLASTY W/ TURBINOPLASTY Bilateral 04/25/2017   NASAL SEPTOPLASTY WITH TURBINATE REDUCTION/notes 04/25/2017  . NASAL SEPTOPLASTY W/ TURBINOPLASTY Bilateral 04/25/2017   Procedure: NASAL SEPTOPLASTY WITH TURBINATE REDUCTION;  Surgeon: Leta Baptist, MD;  Location: [place];  Service: ENT;  Laterality: Bilateral;  . RIGHT/LEFT HEART CATH AND CORONARY/GRAFT ANGIOGRAPHY N/A 10/05/2017   Procedure: RIGHT/LEFT HEART CATH AND CORONARY/GRAFT ANGIOGRAPHY;  Surgeon: Sherren Mocha, MD;  Location: [place] CV LAB;  Service: Cardiovascular;  Laterality: N/A;  . TEE WITHOUT CARDIOVERSION N/A 11/20/2017   Procedure: TRANSESOPHAGEAL ECHOCARDIOGRAM (TEE);  Surgeon: Sherren Mocha, MD;  Location: [place];  Service: Open Heart Surgery;  Laterality: N/A;  . TONSILLECTOMY    . TRANSCATHETER AORTIC VALVE REPLACEMENT, TRANSFEMORAL N/A 11/20/2017   Procedure: TRANSCATHETER AORTIC VALVE REPLACEMENT, TRANSFEMORAL;  Surgeon: Sherren Mocha, MD;  Location: [name];  Service:  Open Heart Surgery;  Laterality: N/A;     Current Meds  Medication Sig  . acetaminophen (TYLENOL) 500 MG tablet Take 1 tablet (500 mg total) by mouth 2 (two) times daily. (Patient taking differently: Take 1,500 mg by mouth daily as needed for moderate pain. )  . aspirin EC 81 MG EC tablet Take 1 tablet (81 mg total) by mouth daily.  . blood glucose meter kit and supplies KIT Dispense based on patient and insurance preference. Test  up to four times daily as directed.Dx: E11.9  . calcium carbonate (TUMS - DOSED IN MG ELEMENTAL CALCIUM) 500 MG chewable tablet Chew 2 tablets by mouth 2 (two) times daily as needed for indigestion or heartburn.   . carvedilol (COREG) 3.125 MG tablet TAKE (1) TABLET BY MOUTH TWICE DAILY WITH A MEAL. (Patient taking differently: Take 3.125 mg by mouth 2 (two) times daily with a meal. )  . FIBER PO Take 5 capsules by mouth daily.  Marland Kitchen HYDROcodone-acetaminophen (NORCO) 10-325 MG tablet Take one tablet po twice daily prn  pain (Patient taking differently: Take 1 tablet by mouth 2 (two) times daily as needed for moderate pain. )  . insulin aspart (NOVOLOG FLEXPEN) 100 UNIT/ML FlexPen Inject 3 Units into the skin 3 (three) times daily with meals.  . Insulin Detemir (LEVEMIR FLEXTOUCH) 100 UNIT/ML Pen Inject 40 Units into the skin at bedtime.  . isosorbide dinitrate (ISORDIL) 30 MG tablet Take 1 tablet (30 mg total) by mouth daily.  Marland Kitchen linaclotide (LINZESS) 290 MCG CAPS capsule TAKE (1) CAPSULE BY MOUTH EACH MORNING BEFORE BREAKFAST.  Marland Kitchen Menthol-Methyl Salicylate (GOODSENSE MUSCLE RUB) 8-30 % CREA Apply 1 application topically daily as needed (muscle pain).  Marland Kitchen metolazone (ZAROXOLYN) 2.5 MG tablet TAKE ONE TABLET BY MOUTH DAILY. (Patient taking differently: Take 2.5 mg by mouth See admin instructions. Take 2.5 mg on Friday, may take a second 2.5 mg dose during the week as needed for excessive fluid)  . Multiple Vitamins-Minerals (CENTRUM SILVER PO) Take 1 tablet by mouth daily.   .  nitroGLYCERIN (NITROSTAT) 0.4 MG SL tablet PLACE 1 TABLET UNDER TONGUE FOR CHEST PAIN. MAY REPEAT EVERY 5 MIN UP TO 3 DOSES-NO RELIEF,CALL 911. (Patient taking differently: Place 0.8 mg under the tongue every 5 (five) minutes as needed for chest pain. )  . pantoprazole (PROTONIX) 40 MG tablet Take 1 tablet (40 mg total) by mouth 2 (two) times daily.  . polycarbophil (FIBERCON) 625 MG tablet Take 2 tablets (1,250 mg total) by mouth daily with lunch. Hold if you have diarrhea  . potassium chloride SA (KLOR-CON M20) 20 MEQ tablet Take 2 tablets (40 mEq total) by mouth 2 (two) times daily.  . rivaroxaban (XARELTO) 20 MG TABS tablet Take 1 tablet (20 mg total) by mouth daily with supper.  . rosuvastatin (CRESTOR) 40 MG tablet TAKE ONE TABLET BY MOUTH DAILY. (Patient taking differently: Take 40 mg by mouth daily. )  . spironolactone (ALDACTONE) 25 MG tablet Take 1 tablet (25 mg total) by mouth daily.  . SURE COMFORT PEN NEEDLES 31G X 8 MM MISC USING 4 TIMES DAILY.  . tamsulosin (FLOMAX) 0.4 MG CAPS capsule Take 1 capsule (0.4 mg total) by mouth daily.  Marland Kitchen torsemide (DEMADEX) 20 MG tablet Take 2 tablets (40 mg total) by mouth 2 (two) times daily.  . traMADol (ULTRAM) 50 MG tablet TAKE 1 TABLET BY MOUTH EVERY 6 TO 8 HOURS AS NEEDED FOR HEADACHE. (Patient taking differently: Take 50 mg by mouth every 8 (eight) hours as needed (headaches). )  . trolamine salicylate (ASPERCREME) 10 % cream Apply 1 application topically 2 (two) times daily as needed for muscle pain.   . [DISCONTINUED] cephALEXin (KEFLEX) 500 MG capsule Take 1 capsule (500 mg total) by mouth 4 (four) times daily.  . [DISCONTINUED] Lidocaine HCl (LIDOCAINE PLUS) 4 % CREA Apply 1 application topically daily as needed (itching).  . [DISCONTINUED] liver oil-zinc oxide (DESITIN) 40 % ointment Apply 1 application topically as needed for irritation.     Allergies:   Dyflex-g [dyphylline-guaifenesin] and Eliquis [apixaban]   Social History   Tobacco  Use  . Smoking status: Former Smoker    Quit date: 08/29/1999    Years since quitting: 20.0  . Smokeless tobacco: Never Used  . Tobacco comment: couldn't remember date  Substance Use Topics  . Alcohol use: No    Alcohol/week: 0.0 standard drinks  . Drug use: No     Family Hx: The patient's family history includes CAD in his mother; Diabetes in his mother. There is no history  of Colon cancer or Colon polyps. He was adopted.  ROS:   Please see the history of present illness.     All other systems reviewed and are negative.   Prior CV studies:   The following studies were reviewed today:  Jan 2018 echo Study Conclusions  - Left ventricle: The cavity size was normal. Wall thickness was normal. The estimated ejection fraction was in the range of 10% to 15%. Diffuse hypokinesis. Doppler parameters are consistent with restrictive physiology, indicative of decreased left ventricular diastolic compliance and/or increased left atrial pressure. Doppler parameters are consistent with high ventricular filling pressure. - Aortic valve: Moderately calcified annulus. Moderately thickened leaflets. There was moderate stenosis. AV gradients decreased in the setting of severe LV systolic dysfunction. AVA VTI and dimensionless index support moderate aortic stenosis. Mean gradient (S): 17 mm Hg. Valve area (VTI): 1.2 cm^2. Valve area (Vmax): 1.1 cm^2. - Left atrium: The atrium was severely dilated. - Pulmonary arteries: Systolic pressure was moderately increased. PA peak pressure: 58 mm Hg (S). - Inferior vena cava: The vessel was dilated. The respirophasic diameter changes were blunted (<50%), consistent with elevated central venous pressure. - Technically difficult study. Echocontrast was used to enhance visualization.   09/2017 cath Severe three-vessel coronary artery disease with total occlusion of the RCA, total occlusion of the left circumflex, and  total occlusion of the LAD just after the first diagonal 2. Status post aortocoronary bypass surgery with patent grafts including LIMA to LAD, saphenous vein graft to ramus intermedius, saphenous vein graft to second obtuse marginal, and saphenous vein graft to right PDA 3. Calcified, restricted aortic valve under plain fluoroscopy, with mean transvalvular gradient 27 mmHg  Recommendation continued evaluation for progressive aortic stenosis suspicious for low flow low gradient aortic stenosis, CT angiography of the heart and peripheral vessels will be ordered and multidisciplinary evaluation by cardiac surgery.     Labs/Other Tests and Data Reviewed:    EKG:  No ECG reviewed.  Recent Labs: 09/12/2018: BNP 477.4 08/25/2019: ALT 16 08/28/2019: BUN 40; Creatinine, Ser 1.52; Hemoglobin 14.5; Magnesium 2.8; Platelets 296; Potassium 3.4; Sodium 134   Recent Lipid Panel Lab Results  Component Value Date/Time   CHOL 133 03/03/2019 03:52 PM   TRIG 155 (H) 03/03/2019 03:52 PM   HDL 29 (L) 03/03/2019 03:52 PM   CHOLHDL 4.6 03/03/2019 03:52 PM   CHOLHDL 3.9 01/02/2015 10:10 AM   LDLCALC 73 03/03/2019 03:52 PM    Wt Readings from Last 3 Encounters:  09/09/19 251 lb (113.9 kg)  08/27/19 255 lb 8.2 oz (115.9 kg)  06/30/19 283 lb (128.4 kg)     Objective:    Vital Signs:  BP (!) 90/59   Pulse 89   Ht 5' 10.5" (1.791 m)   Wt 251 lb (113.9 kg)   BMI 35.51 kg/m    [place] affect. Normal speech pattern and tone. Comfortable, no apparent distress  ASSESSMENT & PLAN:    1.Chronic systolic HF -stable weights since discharge. He is taking torsemide 72m bid, metolazone once a week. Conitnue current regimen     2. CAD - no recent symptoms, continue current meds  3. HTN - soft bp's, asymptomatic. Given his LVEF not unreasonable for SBPs in 90s, continue to monitor.   4. Hyperlipidemia -continue statin.    5. Afib - no symptoms, continue current med   6. VT - recent  admission with ICD shock for VT in setting of hypokalemia and COVID pneumonia - has labs ordered for this  week with pcp, f/u K and renal function   COVID-19 Education: The signs and symptoms of COVID-19 were discussed with the patient and how to seek care for testing (follow up with PCP or arrange E-visit).  The importance of social distancing was discussed today.  Time:   Today, I have spent 22 minutes with the patient with telehealth technology discussing the above problems.     Medication Adjustments/Labs and Tests Ordered: Current medicines are reviewed at length with the patient today.  Concerns regarding medicines are outlined above.   Tests Ordered: No orders of the defined types were placed in this encounter.   Medication Changes: No orders of the defined types were placed in this encounter.   Follow Up:  Either In Person or Virtual in 2 month(s)  Signed, Carlyle Dolly, MD  09/09/2019 1:58 PM

## 2019-09-09 NOTE — Patient Instructions (Signed)
Medication Instructions:  Your physician recommends that you continue on your current medications as directed. Please refer to the Current Medication list given to you today.   Labwork: NONE  Testing/Procedures: NONE  Follow-Up: Your physician recommends that you schedule a follow-up appointment in: 2 MONTHS    Any Other Special Instructions Will Be Listed Below (If Applicable).     If you need a refill on your cardiac medications before your next appointment, please call your pharmacy.   

## 2019-09-15 ENCOUNTER — Telehealth: Payer: Self-pay | Admitting: Family Medicine

## 2019-09-15 NOTE — Telephone Encounter (Signed)
Pt would like to know if he can get his pain medication early. He had a tooth taken out last week and he is still having pain with it. [facility] said he would not be able to fill it until Wednesday.

## 2019-09-16 ENCOUNTER — Other Ambulatory Visit: Payer: Self-pay | Admitting: Family Medicine

## 2019-09-16 ENCOUNTER — Ambulatory Visit (INDEPENDENT_AMBULATORY_CARE_PROVIDER_SITE_OTHER): Payer: Medicare Other | Admitting: *Deleted

## 2019-09-16 DIAGNOSIS — I480 Paroxysmal atrial fibrillation: Secondary | ICD-10-CM | POA: Diagnosis not present

## 2019-09-16 LAB — CUP PACEART REMOTE DEVICE CHECK
Battery Remaining Longevity: 105 mo
Battery Voltage: 3.01 V
Brady Statistic RV Percent Paced: 0.01 %
Date Time Interrogation Session: 20210119044223
HighPow Impedance: 88 Ohm
Implantable Lead Implant Date: 20170921
Implantable Lead Location: 753860
Implantable Pulse Generator Implant Date: 20170921
Lead Channel Impedance Value: 342 Ohm
Lead Channel Impedance Value: 399 Ohm
Lead Channel Pacing Threshold Amplitude: 0.625 V
Lead Channel Pacing Threshold Pulse Width: 0.4 ms
Lead Channel Sensing Intrinsic Amplitude: 9.5 mV
Lead Channel Sensing Intrinsic Amplitude: 9.5 mV
Lead Channel Setting Pacing Amplitude: 2.5 V
Lead Channel Setting Pacing Pulse Width: 0.4 ms
Lead Channel Setting Sensing Sensitivity: 0.3 mV

## 2019-09-16 MED ORDER — HYDROCODONE-ACETAMINOPHEN 10-325 MG PO TABS: ORAL_TABLET | ORAL | 0 refills | Status: DC

## 2019-09-16 NOTE — Telephone Encounter (Signed)
Patient notified and advised that Dr Nicki Reaper doesn't  want him taking more than 2 pills a day.  Patient verbalized understanding.

## 2019-09-16 NOTE — Telephone Encounter (Signed)
I do not recommend the tramadol since he already has hydrocodone is not wise to be on 2 essentially narcotic pain medicines refuse the refill

## 2019-09-16 NOTE — Telephone Encounter (Signed)
Patient was sent in but the patient needs to adhere to no more than 2/day regarding the pain medicine.  This needs to last him 30 days

## 2019-09-16 NOTE — Telephone Encounter (Signed)
Left message to return call 

## 2019-09-17 ENCOUNTER — Encounter: Payer: Self-pay | Admitting: Urology

## 2019-09-18 ENCOUNTER — Ambulatory Visit (INDEPENDENT_AMBULATORY_CARE_PROVIDER_SITE_OTHER): Payer: Medicare Other | Admitting: Family Medicine

## 2019-09-18 ENCOUNTER — Ambulatory Visit (INDEPENDENT_AMBULATORY_CARE_PROVIDER_SITE_OTHER): Payer: Medicare Other | Admitting: Cardiology

## 2019-09-18 ENCOUNTER — Other Ambulatory Visit: Payer: Self-pay

## 2019-09-18 ENCOUNTER — Encounter: Payer: Self-pay | Admitting: Cardiology

## 2019-09-18 VITALS — BP 118/68 | HR 67 | Ht 70.5 in | Wt 261.0 lb

## 2019-09-18 VITALS — BP 98/64

## 2019-09-18 DIAGNOSIS — E1121 Type 2 diabetes mellitus with diabetic nephropathy: Secondary | ICD-10-CM

## 2019-09-18 DIAGNOSIS — K209 Esophagitis, unspecified without bleeding: Secondary | ICD-10-CM | POA: Diagnosis not present

## 2019-09-18 DIAGNOSIS — I255 Ischemic cardiomyopathy: Secondary | ICD-10-CM | POA: Diagnosis not present

## 2019-09-18 DIAGNOSIS — D509 Iron deficiency anemia, unspecified: Secondary | ICD-10-CM | POA: Diagnosis not present

## 2019-09-18 DIAGNOSIS — I10 Essential (primary) hypertension: Secondary | ICD-10-CM | POA: Diagnosis not present

## 2019-09-18 DIAGNOSIS — R634 Abnormal weight loss: Secondary | ICD-10-CM

## 2019-09-18 MED ORDER — NYSTATIN 100000 UNIT/ML MT SUSP: OROMUCOSAL | 0 refills | Status: DC

## 2019-09-18 MED ORDER — SUCRALFATE 1 G PO TABS: 1.0000 g | ORAL_TABLET | Freq: Four times a day (QID) | ORAL | 0 refills | Status: DC

## 2019-09-18 MED ORDER — FLUCONAZOLE 200 MG PO TABS: 200.0000 mg | ORAL_TABLET | Freq: Every day | ORAL | 0 refills | Status: DC

## 2019-09-18 NOTE — Progress Notes (Signed)
Subjective:    Patient ID: [name] [name], male    DOB: 1950-10-15, 69 y.o.   MRN: [id number]  HPIpt states he has not ate or drink anything in 2 days. Pt states he has an abscess tooth and is taking keflex and clindmaycin and now having burning in his throat.  Patient relates he has pain and discomfort when he swallows very painful does not want to eat or drink but he is forcing himself to do so.  States he has lost some weight because of this.  States he is taking his other medications.  Denies high fever chills denies vomiting denies dysphagia denies rectal bleeding.  No sweats no wheezing. xarelto on pt's med list is 20mg  but his bottle is 15mg . Pt states he is suppose to be on 20mg .  Stopped aspirin because his stool looked like it had blood in it when he got home from hospital. States it looks normal now.   Ran out of iron and wants to know if he should be taking it.  Told him for right now to hold off on this until future blood work Needs refill on clotrimazole/betamethasone cream.    Review of Systems  Constitutional: Negative for diaphoresis and fatigue.  HENT: Negative for congestion and rhinorrhea.   Respiratory: Negative for cough and shortness of breath.   Cardiovascular: Negative for chest pain and leg swelling.  Gastrointestinal: Negative for abdominal pain and diarrhea.  Skin: Negative for color change and rash.  Neurological: Negative for dizziness and headaches.  Psychiatric/Behavioral: Negative for behavioral problems and confusion.   Positive for esophageal pain with swallowing    Objective:   Physical Exam Vitals reviewed.  Constitutional:      General: He is not in acute distress. HENT:     Head: Normocephalic and atraumatic.  Eyes:     General:        Right eye: No discharge.        Left eye: No discharge.  Neck:     Trachea: No tracheal deviation.  Cardiovascular:     Rate and Rhythm: Normal rate. Rhythm irregular.     Heart sounds: Normal heart  sounds. No murmur.  Pulmonary:     Effort: Pulmonary effort is normal. No respiratory distress.     Breath sounds: Normal breath sounds.  Lymphadenopathy:     Cervical: No cervical adenopathy.  Skin:    General: Skin is warm and dry.  Neurological:     Mental Status: He is alert.     Coordination: Coordination normal.  Psychiatric:        Behavior: Behavior normal.   Does have some pedal edema not severe        Assessment & Plan:  1. Esophagitis Check lab work use PPI twice daily add Carafate on a regular basis plus also Diflucan 1 daily for the next 7 days plus also nystatin oral solution 1 teaspoon swish and swallow 4 times daily probable yeast esophagitis but could also be pill esophagitis if he does not improve by Monday will need EGD - Ferritin - CBC with Differential/Platelet - Basic metabolic panel - Magnesium - Ambulatory referral to Gastroenterology  2. Weight loss Weight loss related to esophagitis hopefully as it improves his oral intake will improve - Ferritin - CBC with Differential/Platelet - Basic metabolic panel - Magnesium - Ambulatory referral to Gastroenterology  3. Iron deficiency anemia, unspecified iron deficiency anemia type Check lab work await results hold off on iron tablets currently -  Ferritin - CBC with Differential/Platelet  4. Cardiomyopathy, ischemic Currently right now I think he is stable he does have some pedal edema but I would not recommend changing his medicines currently  5. Essential hypertension Blood pressure overall decent continue current measures  6. Type 2 diabetes with nephropathy (HCC) Glucose readings have been decent did discuss importance of keeping them under control

## 2019-09-18 NOTE — Progress Notes (Signed)
he   Electrophysiology Office Note   Date:  09/18/2019   ID:  [name] [name], DOB 16-Aug-1951, MRN [id number]  PCP:  Kathyrn Drown, MD  Cardiologist:  Johnsie Cancel Primary Electrophysiologist:  Constance Haw, MD    No chief complaint on file.    History of Present Illness: [name] [name] is a 69 y.o. male who presents today for electrophysiology evaluation.   He has a history of morbid obesity, hypertension, hyperlipidemia, type 2 diabetes, ischemic cardiomyopathy with an EF of 15%, coronary disease status post CABG 6 in 2001 and subsequent PCI. He was admitted to [facility] 2/17 with CHF. He had a Medtronic ICD implanted 05/18/16.  In the spring 2019, he developed left bundle branch block.  It occurred after TAVR on 11/20/2017.  He was admitted to the hospital December 2020 with Covid pneumonia.  He had an ICD shock at that time.  His potassium was found to be 2.8.  Today, denies symptoms of palpitations, chest pain, shortness of breath, orthopnea, PND, lower extremity edema, claudication, dizziness, presyncope, syncope, bleeding, or neurologic sequela. The patient is tolerating medications without difficulties.  Overall he is doing well.  He has no chest pain or shortness of breath.  Since his hospitalization, he had a tooth pulled and is continuing to have quite a bit of pain.  He has had some esophageal issues from some of the medicines that he has been prescribed and is currently following up with his primary physician.  He continues to have left bundle branch block after his TAVR valve.   Past Medical History:  Diagnosis Date  . AICD (automatic cardioverter/defibrillator) present 05/18/2016  . Anginal pain ([place])    occ none recent  . Aortic stenosis   . Arthritis   . CAD (coronary artery disease)   . Cardiomyopathy, ischemic   . CHF (congestive heart failure) ([place])   . Chronic back pain   . Diabetes mellitus without complication ([place])   . Elevated PSA   . History of  kidney stones   . Hyperlipidemia   . Morbid obesity ([place])   . Myocardial infarction ([place])   . OSA (obstructive sleep apnea)    does not wear CPAP, -order run out told need to redo  test  . Paroxysmal atrial fibrillation ([place]) 09/29/2018   This was found on event monitor October 2019  . 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 APPENDECTOMY    . BIOPSY  10/02/2018   Procedure: BIOPSY;  Surgeon: Daneil Dolin, MD;  [facility]: [facility];  Service: Endoscopy;;  gastric  . CARDIAC CATHETERIZATION     stents  . CAROTID STENT     denies carotid stent  . CATARACT EXTRACTION, BILATERAL    . CORONARY ARTERY BYPASS GRAFT  12/23/1999   LIMA to LAD, SVG to ramus, sequential SVG to OM2-RPL, sequential SVG to AM-PDA  . EP IMPLANTABLE DEVICE N/A 05/18/2016   Procedure: ICD Implant;  Surgeon: Isai Gottlieb Meredith Leeds, MD;  Location: [place] CV LAB;  Service: Cardiovascular;  Laterality: N/A;  . ESOPHAGOGASTRODUODENOSCOPY (EGD) WITH PROPOFOL N/A 10/02/2018   Procedure: ESOPHAGOGASTRODUODENOSCOPY (EGD) WITH PROPOFOL;  Surgeon: Daneil Dolin, MD;  Location: [facility];  Service: Endoscopy;  Laterality: N/A;  . EYE SURGERY    . ingrown toenail N/A   . LEFT HEART CATHETERIZATION WITH CORONARY/GRAFT ANGIOGRAM N/A 12/21/2014   Procedure: LEFT HEART CATHETERIZATION WITH Beatrix Fetters;  Surgeon: Belva Crome, MD;  Location: [facility] CATH LAB;  Service: Cardiovascular;  Laterality: N/A;  . NASAL SEPTOPLASTY W/ TURBINOPLASTY Bilateral 04/25/2017   NASAL SEPTOPLASTY WITH TURBINATE REDUCTION/notes 04/25/2017  . NASAL SEPTOPLASTY W/ TURBINOPLASTY Bilateral 04/25/2017   Procedure: NASAL SEPTOPLASTY WITH TURBINATE REDUCTION;   Surgeon: Leta Baptist, MD;  Location: [place];  Service: ENT;  Laterality: Bilateral;  . RIGHT/LEFT HEART CATH AND CORONARY/GRAFT ANGIOGRAPHY N/A 10/05/2017   Procedure: RIGHT/LEFT HEART CATH AND CORONARY/GRAFT ANGIOGRAPHY;  Surgeon: Sherren Mocha, MD;  Location: [place] CV LAB;  Service: Cardiovascular;  Laterality: N/A;  . TEE WITHOUT CARDIOVERSION N/A 11/20/2017   Procedure: TRANSESOPHAGEAL ECHOCARDIOGRAM (TEE);  Surgeon: Sherren Mocha, MD;  Location: [place];  Service: Open Heart Surgery;  Laterality: N/A;  . TONSILLECTOMY    . TRANSCATHETER AORTIC VALVE REPLACEMENT, TRANSFEMORAL N/A 11/20/2017   Procedure: TRANSCATHETER AORTIC VALVE REPLACEMENT, TRANSFEMORAL;  Surgeon: Sherren Mocha, MD;  Location: [place];  Service: Open Heart Surgery;  Laterality: N/A;     Current Outpatient Medications  Medication Sig Dispense Refill  . acetaminophen (TYLENOL) 500 MG tablet Take 1 tablet (500 mg total) by mouth 2 (two) times daily. 14 tablet 0  . aspirin EC 81 MG EC tablet Take 1 tablet (81 mg total) by mouth daily.    . blood glucose meter kit and supplies KIT Dispense based on patient and insurance preference. Test  up to four times daily as directed.Dx: E11.9 1 each 0  . calcium carbonate (TUMS - DOSED IN MG ELEMENTAL CALCIUM) 500 MG chewable tablet Chew 2 tablets by mouth 2 (two) times daily as needed for indigestion or heartburn.     . carvedilol (COREG) 3.125 MG tablet TAKE (1) TABLET BY MOUTH TWICE DAILY WITH A MEAL. 60 tablet 2  . Ferrous Sulfate (FEROSUL PO) Take by mouth.    . FIBER PO Take 5 capsules by mouth daily.    . fluconazole (DIFLUCAN) 200 MG tablet Take 1 tablet (200 mg total) by mouth daily. 7 tablet 0  . HYDROcodone-acetaminophen (NORCO) 10-325 MG tablet Take one tablet po twice daily prn pain 60 tablet 0  . ibuprofen (ADVIL) 800 MG tablet Take 800 mg by mouth every 8 (eight) hours as needed.    . insulin aspart (NOVOLOG FLEXPEN) 100 UNIT/ML FlexPen Inject 3 Units into the skin 3  (three) times daily with meals. 15 mL 0  . Insulin Detemir (LEVEMIR FLEXTOUCH) 100 UNIT/ML Pen Inject 40 Units into the skin at bedtime. 15 mL 0  . isosorbide dinitrate (ISORDIL) 30 MG tablet Take 1 tablet (30 mg total) by mouth daily. 30 tablet 1  . linaclotide (LINZESS) 290 MCG CAPS capsule TAKE (1) CAPSULE BY MOUTH EACH MORNING BEFORE BREAKFAST. 30 capsule 2  . Menthol-Methyl Salicylate (GOODSENSE MUSCLE RUB) 8-30 % CREA Apply 1 application topically daily as needed (muscle pain).    Marland Kitchen metolazone (ZAROXOLYN) 2.5 MG tablet TAKE ONE TABLET BY MOUTH DAILY. 30 tablet 3  . Multiple Vitamins-Minerals (CENTRUM SILVER PO) Take 1 tablet by mouth daily.     . nitroGLYCERIN (NITROSTAT) 0.4 MG SL tablet PLACE 1 TABLET UNDER TONGUE FOR CHEST PAIN. MAY REPEAT EVERY 5 MIN UP TO 3 DOSES-NO RELIEF,CALL 911. 25 tablet 6  . nystatin (MYCOSTATIN) 100000 UNIT/ML suspension Take  one tsp (29m) qid. Swish and swallow. Take for 10 days 60 mL 0  . pantoprazole (PROTONIX) 40 MG tablet Take 1 tablet (40 mg total) by mouth 2 (two) times daily. 60 tablet 3  . polycarbophil (FIBERCON) 625 MG tablet Take 2 tablets (1,250 mg total) by mouth daily with lunch. Hold if you have diarrhea 30 tablet 0  . potassium chloride SA (KLOR-CON M20) 20 MEQ tablet Take 2 tablets (40 mEq total) by mouth 2 (two) times daily. 120 tablet 1  . rivaroxaban (XARELTO) 20 MG TABS tablet Take 1 tablet (20 mg total) by mouth daily with supper. 30 tablet 6  . rosuvastatin (CRESTOR) 40 MG tablet TAKE ONE TABLET BY MOUTH DAILY. 90 tablet 0  . spironolactone (ALDACTONE) 25 MG tablet Take 1 tablet (25 mg total) by mouth daily. 30 tablet 1  . sucralfate (CARAFATE) 1 g tablet Take 1 tablet (1 g total) by mouth 4 (four) times daily. 120 tablet 0  . SURE COMFORT PEN NEEDLES 31G X 8 MM MISC USING 4 TIMES DAILY. 100 each 0  . tamsulosin (FLOMAX) 0.4 MG CAPS capsule Take 1 capsule (0.4 mg total) by mouth daily. 30 capsule 5  . torsemide (DEMADEX) 20 MG tablet Take 2  tablets (40 mg total) by mouth 2 (two) times daily. 120 tablet 1  . traMADol (ULTRAM) 50 MG tablet TAKE 1 TABLET BY MOUTH EVERY 6 TO 8 HOURS AS NEEDED FOR HEADACHE. 20 tablet 0  . trolamine salicylate (ASPERCREME) 10 % cream Apply 1 application topically 2 (two) times daily as needed for muscle pain.      No current facility-administered medications for this visit.    Allergies:   Dyflex-g [dyphylline-guaifenesin] and Eliquis [apixaban]   Social History:  The patient  reports that he quit smoking about 20 years ago. He has never used smokeless tobacco. He reports that he does not drink alcohol or use drugs.   Family History:  The patient's family history includes CAD in his mother; Diabetes in his mother. He was adopted.    ROS:  Please see the history of present illness.   Otherwise, review of systems is positive for none.   All other systems are reviewed and negative.   PHYSICAL EXAM: VS:  BP 118/68   Pulse 67   Ht 5' 10.5" (1.791 m)   Wt 261 lb (118.4 kg)   SpO2 96%   BMI 36.92 kg/m  , BMI Body mass index is 36.92 kg/m. GEN: Well nourished, well developed, in no acute distress  HEENT: normal  Neck: no JVD, carotid bruits, or masses Cardiac: RRR; no murmurs, rubs, or gallops,no edema  Respiratory:  clear to auscultation bilaterally, normal work of breathing GI: soft, nontender, nondistended, + BS MS: no deformity or atrophy  Skin: warm and dry, device site well healed Neuro:  Strength and sensation are intact Psych: euthymic mood, full affect  EKG:  EKG is not ordered today. Personal review of the ekg ordered 08/26/19 shows sinus rhythm, left bundle branch block, PVC  Personal review of the device interrogation today. Results in [facility] 08/25/2019: ALT 16 08/28/2019: BUN 40; Creatinine, Ser 1.52; Hemoglobin 14.5; Magnesium 2.8; Platelets 296; Potassium 3.4; Sodium 134    Lipid Panel     Component Value Date/Time   CHOL 133 03/03/2019 1552   TRIG 155  (H) 03/03/2019 1552   HDL 29 (L) 03/03/2019 1552   CHOLHDL 4.6 03/03/2019 1552   CHOLHDL 3.9 01/02/2015 1010  VLDL 31 01/02/2015 1010   LDLCALC 73 03/03/2019 1552     Wt Readings from Last 3 Encounters:  09/18/19 261 lb (118.4 kg)  09/09/19 251 lb (113.9 kg)  08/27/19 255 lb 8.2 oz (115.9 kg)      Other studies Reviewed: Additional studies/ records that were reviewed today include: TTE 03/04/19   1. The left ventricle has a visually estimated ejection fraction of 15-20%. The cavity size was moderately dilated. Left ventricular diastolic Doppler parameters are indeterminate.  2. The right ventricle has moderately reduced systolic function. The cavity was moderately enlarged. There is no increase in right ventricular wall thickness. Right ventricular systolic pressure 97.6 mmHg.  3. Left atrial size was severely dilated.  4. Right atrial size was mildly dilated.  5. There is mild mitral annular calcification present.  6. A 7m Edwards Sapien bioprosthetic aortic valve (TAVR) valve is present in the aortic position. Procedure Date: 11/20/2017. Leaflet excursion appears normal. No prosthetic or paravalvular regurgitation. Mean systolic gradient 8 mmHg.  7. The aortic root and ascending aorta are normal in size and structure.  8. The inferior vena cava was dilated in size with <50% respiratory variability.  9. No intracardiac thrombi or masses were visualized with Definity contrast. 10. When compared to the prior study: 01/04/2018 - no significant change has occured. Aortic valve systolic Doppler gradient better visualized on today's exam.  ASSESSMENT AND PLAN:  1.  Ischemic cardiomyopathy: Ejection fraction 50%.  Status post Medtronic single-chamber ICD implanted 05/18/2016.  Post TAVR valve, he has developed left bundle branch block.  We Josmar Messimer thus plan for device upgrade to CRT D.  Risks and benefits were discussed include bleeding, infection, pneumothorax, tamponade.  He understands these  risks and is agreed to the procedure.  2. CAD: No current chest pain  3. Hypertension: Currently well controlled  4.  Aortic stenosis: Status post TAVR.  Plan per primary cardiology.  5.  Paroxysmal atrial fibrillation: Found on device interrogation.  CHA2DS2-VASc of 4.  Currently on Eliquis.    6.  Ventricular tachycardia: Occurred in the setting of Covid pneumonia. His potassium was found to be low. He did not get therapy from his ICD as he converted on his own.  Current medicines are reviewed at length with the patient today.   The patient does not have concerns regarding his medicines.  The following changes were made today: None  Labs/ tests ordered today include:  No orders of the defined types were placed in this encounter.    Disposition:   FU with Tye Vigo 3 months  Signed, Caddie Randle MMeredith Leeds MD  09/18/2019 4:29 PM     [id number] [phone]) [phone](fax)

## 2019-09-18 NOTE — Patient Instructions (Addendum)
Medication Instructions:  Your physician recommends that you continue on your current medications as directed. Please refer to the Current Medication list given to you today.  *If you need a refill on your cardiac medications before your next appointment, please call your pharmacy*  Labwork: None ordered If you have labs (blood work) drawn today and your tests are completely normal, you will receive your results only by:  [place] (if you have MyChart) OR  A paper copy in the mail If you have any lab test that is abnormal or we need to change your treatment, we will call you to review the results.  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  Please see the instructions below.  Follow-Up: Remote monitoring is used to monitor your Pacemaker or ICD from home. This monitoring reduces the number of office visits required to check your device to one time per year. It allows [place] to keep an eye on the functioning of your device to ensure it is working properly. You are scheduled for a device check from home on 12/16/2019. You may send your transmission at any time that day. If you have a wireless device, the transmission will be sent automatically. After your physician reviews your transmission, you will receive a postcard with your next transmission date.  Your physician recommends that you schedule a follow-up appointment in: 10-14 days, after your implant on 10/03/19, with device clinic for a wound check.  Your physician recommends that you schedule a follow-up appointment in: 91 days, after your implant on 10/03/2019, with Dr. Curt Bears.   Thank you for choosing [facility]!!   Trinidad Curet, RN [phone]  Any Other Special Instructions Will Be Listed Below (If Applicable).    Implantable Device Instructions  You are scheduled for: BiVentricual defibrillator upgrade on 10/03/2019 with Dr. Curt Bears.  1.   Pre procedure testing-          COVID TEST-- On 09/30/2019 @ 8:15 am - You will go to [facility] hospital ([place]) for your Covid testing.   This is a drive thru test site.  There will be multiple testing areas.  Be sure to share with the first checkpoint that you are there for pre-procedure/surgery testing. This will put you into the right (yellow) lane that leads to the PAT testing team.   Stay in your car and the nurse team will come to your car to test you.  After you are tested please go home and self quarantine until the day of your procedure.    2. On the day of your procedure 10/03/2019 you will go to [facility] [phone]) at 5:30 am.  Dennis Bast will go to the main entrance [address] [employer]) and enter where the [employer] are.  You will check in at ADMITTING.  You may have one support person come in to the hospital with you.  They will be asked to wait in the waiting room.   3.   Do not eat or drink after midnight prior to your procedure.   4.   Take 1/2 your usual dose of bedtime insulin the night before this procedure.  On the morning of your procedure do NOT take any medication.  5.  The night before your procedure and the morning of your procedure scrub your neck/chest with surgical scrub.  An instruction letter is included with this letter.     5.  Plan for an overnight stay.  If you use your phone frequently bring your phone charger.  When you are discharged you will need someone to drive you home.   6.  You will follow up with the [facility] clinic 10-14 days after your procedure. You will follow up with Dr. Curt Bears 91 days after your procedure.  These appointments will be made for you.   * If you have ANY questions after you get home, please call the office [phone] and ask for Andrea Ferrer RN or send a MyChart message.     Cardioverter Defibrillator Implantation, Care After This sheet gives you information about how to care for yourself after your  procedure. Your health care provider may also give you more specific instructions. If you have problems or questions, contact your health care provider. What can I expect after the procedure? After the procedure, it is common to have:  Some pain. It may last a few days.  A slight bump over the skin where the device was placed. Sometimes, it is possible to feel the device under the skin. This is normal.  During the months and years after your procedure, your health care provider will check the device, the leads, and the battery every few months. Eventually, when the battery is low, the device will be replaced. Follow these instructions at home: Medicines  Take over-the-counter and prescription medicines only as told by your health care provider.  If you were prescribed an antibiotic medicine, take it as told by your health care provider. Do not stop taking the antibiotic even if you start to feel better. Incision care   Follow instructions from your health care provider about how to take care of your incision area. Make sure you: ? Wash your hands with soap and water before you change your bandage (dressing). If soap and water are not available, use hand sanitizer. ? Change your dressing as told by your health care provider. ? Leave stitches (sutures), skin glue, or adhesive strips in place. These skin closures may need to stay in place for 2 weeks or longer. If adhesive strip edges start to loosen and curl up, you may trim the loose edges. Do not remove adhesive strips completely unless your health care provider tells you to do that.  Check your incision area every day for signs of infection. Check for: ? More redness, swelling, or pain. ? More fluid or blood. ? Warmth. ? Pus or a bad smell.  Do not use lotions or ointments near the incision area unless told by your health care provider.  Keep the incision area clean and dry for 2-3 days after the procedure or for as long as told by  your health care provider. It takes several weeks for the incision site to heal completely.  Do not take baths, swim, or use a hot tub until your health care provider approves. Activity  Try to walk a little every day. Exercising is important after this procedure. Also, use your shoulder on the side of the defibrillator in daily tasks that do not require a lot of motion.  For at least 6 weeks: ? Do not lift your upper arm above your shoulders. This means no tennis, golf, or swimming for this period of time. If you tend to sleep with your arm above your head, use a restraint to prevent this during sleep. ? Avoid sudden jerking, pulling, or chopping movements that pull your upper arm far away from your body.  Ask your health care provider when you  may go back to work.  Check with your health care provider before you start to drive or play sports. Electric and magnetic fields  Tell all health care providers that you have a defibrillator. This may prevent them from giving you an MRI scan because strong magnets are used for that test.  If you must pass through a metal detector, quickly walk through it. Do not stop under the detector, and do not stand near it.  Avoid places or objects that have a strong electric or magnetic field, including: ? Airport [employer]. At the airport, let officials know that you have a defibrillator. Your defibrillator ID card will let you be checked in a way that is safe for you and will not damage your defibrillator. Also, do not let a security person wave a magnetic wand near your defibrillator. That can make it stop working. ? Power plants. ? Large electrical generators. ? Anti-theft systems or electronic article surveillance (EAS). ? Radiofrequency transmission towers, such as cell phone and radio towers.  Do not use amateur (ham) radio equipment or electric (arc) welding torches. Some devices are safe to use if held at least 12 inches (30 cm) from your  defibrillator. These include power tools, lawn mowers, and speakers. If you are unsure if something is safe to use, ask your health care provider.  Do not use MP3 player headphones. They have magnets.  You may safely use electric blankets, heating pads, computers, and microwave ovens.  When using your cell phone, hold it to the ear that is on the opposite side from the defibrillator. Do not leave your cell phone in a pocket over the defibrillator. General instructions  Follow diet instructions from your health care provider, if this applies.  Always keep your defibrillator ID card with you. The card should list the implant date, device model, and manufacturer. Consider wearing a medical alert bracelet or necklace.  Have your defibrillator checked every 3-6 months or as often as told by your health care provider. Most defibrillators last for 4-8 years.  Keep all follow-up visits as told by your health care provider. This is important for your health care provider to make sure your chest is healing the way it should. Ask your health care provider when you should come back to have your stitches or staples taken out. Contact a health care provider if:  You feel one shock in your chest.  You gain weight suddenly.  Your legs or feet swell more than they have before.  It feels like your heart is fluttering or skipping beats (heart palpitations).  You have more redness, swelling, or pain around your incision.  You have more fluid or blood coming from your incision.  Your incision feels warm to the touch.  You have pus or a bad smell coming from your incision.  You have a fever. Get help right away if:  You have chest pain.  You feel more than one shock.  You feel more short of breath than you have felt before.  You feel more light-headed than you have felt before.  Your incision starts to open up. This information is not intended to replace advice given to you by your health  care provider. Make sure you discuss any questions you have with your health care provider. Document Released: 03/03/2005 Document Revised: 03/03/2016 Document Reviewed: 01/19/2016 Elsevier Interactive Patient Education  2018 [place] Discharge Instructions for  Pacemaker/Defibrillator Patients  ACTIVITY No heavy lifting or  vigorous activity with your left/right arm for 6 to 8 weeks.  Do not raise your left/right arm above your head for one week.  Gradually raise your affected arm as drawn below.           __  NO DRIVING for     ; you may begin driving on     .  WOUND CARE - Keep the wound area clean and dry.  Do not get this area wet for one week. No showers for one week; you may shower on     . - The tape/steri-strips on your wound will fall off; do not pull them off.  No bandage is needed on the site.  DO  NOT apply any creams, oils, or ointments to the wound area. - If you notice any drainage or discharge from the wound, any swelling or bruising at the site, or you develop a fever > 101? F after you are discharged home, call the office at once.  SPECIAL INSTRUCTIONS - You are still able to use cellular telephones; use the ear opposite the side where you have your pacemaker/defibrillator.  Avoid carrying your cellular phone near your device. - When traveling through airports, show security personnel your identification card to avoid being screened in the metal detectors.  Ask the security personnel to use the hand wand. - Avoid arc welding equipment, MRI testing (magnetic resonance imaging), TENS units (transcutaneous nerve stimulators).  Call the office for questions about other devices. - Avoid electrical appliances that are in poor condition or are not properly grounded. - Microwave ovens are safe to be near or to operate.  ADDITIONAL INFORMATION FOR DEFIBRILLATOR PATIENTS SHOULD YOUR DEVICE GO OFF: - If your device goes off ONCE and you feel fine  afterward, notify the device clinic nurses. - If your device goes off ONCE and you do not feel well afterward, call 911. - If your device goes off TWICE, call 911. - If your device goes off [place], call 911.  DO NOT DRIVE YOURSELF OR A FAMILY MEMBER WITH A DEFIBRILLATOR TO THE HOSPITAL-CALL 911.

## 2019-09-19 ENCOUNTER — Other Ambulatory Visit: Payer: Self-pay

## 2019-09-19 ENCOUNTER — Encounter: Payer: Self-pay | Admitting: Family Medicine

## 2019-09-19 DIAGNOSIS — N476 Balanoposthitis: Secondary | ICD-10-CM

## 2019-09-19 LAB — CBC WITH DIFFERENTIAL/PLATELET
Basophils Absolute: 0 10*3/uL (ref 0.0–0.2)
Basos: 0 %
EOS (ABSOLUTE): 0.2 10*3/uL (ref 0.0–0.4)
Eos: 3 %
Hematocrit: 41.4 % (ref 37.5–51.0)
Hemoglobin: 14 g/dL (ref 13.0–17.7)
Immature Grans (Abs): 0.1 10*3/uL (ref 0.0–0.1)
Immature Granulocytes: 1 %
Lymphocytes Absolute: 0.9 10*3/uL (ref 0.7–3.1)
Lymphs: 10 %
MCH: 31.1 pg (ref 26.6–33.0)
MCHC: 33.8 g/dL (ref 31.5–35.7)
MCV: 92 fL (ref 79–97)
Monocytes Absolute: 1 10*3/uL — ABNORMAL HIGH (ref 0.1–0.9)
Monocytes: 11 %
Neutrophils Absolute: 6.8 10*3/uL (ref 1.4–7.0)
Neutrophils: 75 %
Platelets: 277 10*3/uL (ref 150–450)
RBC: 4.5 x10E6/uL (ref 4.14–5.80)
RDW: 16.9 % — ABNORMAL HIGH (ref 11.6–15.4)
WBC: 9 10*3/uL (ref 3.4–10.8)

## 2019-09-19 LAB — BASIC METABOLIC PANEL WITH GFR
BUN/Creatinine Ratio: 21 (ref 10–24)
BUN: 58 mg/dL — ABNORMAL HIGH (ref 8–27)
CO2: 27 mmol/L (ref 20–29)
Calcium: 8.5 mg/dL — ABNORMAL LOW (ref 8.6–10.2)
Chloride: 95 mmol/L — ABNORMAL LOW (ref 96–106)
Creatinine, Ser: 2.72 mg/dL — ABNORMAL HIGH (ref 0.76–1.27)
GFR calc Af Amer: 27 mL/min/{1.73_m2} — ABNORMAL LOW
GFR calc non Af Amer: 23 mL/min/{1.73_m2} — ABNORMAL LOW
Glucose: 121 mg/dL — ABNORMAL HIGH (ref 65–99)
Potassium: 3.9 mmol/L (ref 3.5–5.2)
Sodium: 141 mmol/L (ref 134–144)

## 2019-09-19 LAB — MAGNESIUM: Magnesium: 2.4 mg/dL — ABNORMAL HIGH (ref 1.6–2.3)

## 2019-09-19 LAB — FERRITIN: Ferritin: 466 ng/mL — ABNORMAL HIGH (ref 30–400)

## 2019-09-19 MED ORDER — CLOTRIMAZOLE-BETAMETHASONE 1-0.05 % EX CREA: TOPICAL_CREAM | Freq: Two times a day (BID) | CUTANEOUS | Status: AC

## 2019-09-19 NOTE — Progress Notes (Signed)
Patient notified and stated he was taking the Xarelto 20mg  daily but will have his wife double check for him- [facility] has refills on Xarelto 20mg  daily from Dr Harl Bowie

## 2019-09-20 ENCOUNTER — Other Ambulatory Visit: Payer: Self-pay | Admitting: Family Medicine

## 2019-09-20 MED ORDER — NYSTATIN 100000 UNIT/ML MT SUSP: OROMUCOSAL | 2 refills | Status: DC

## 2019-09-22 ENCOUNTER — Telehealth: Payer: Self-pay | Admitting: Family Medicine

## 2019-09-22 ENCOUNTER — Other Ambulatory Visit: Payer: Self-pay | Admitting: Family Medicine

## 2019-09-22 ENCOUNTER — Telehealth: Payer: Self-pay

## 2019-09-22 ENCOUNTER — Encounter: Payer: Self-pay | Admitting: Gastroenterology

## 2019-09-22 ENCOUNTER — Other Ambulatory Visit: Payer: Self-pay

## 2019-09-22 DIAGNOSIS — I10 Essential (primary) hypertension: Secondary | ICD-10-CM

## 2019-09-22 DIAGNOSIS — Z79899 Other long term (current) drug therapy: Secondary | ICD-10-CM

## 2019-09-22 MED ORDER — TORSEMIDE 20 MG PO TABS: ORAL_TABLET | ORAL | 1 refills | Status: DC

## 2019-09-22 NOTE — Telephone Encounter (Signed)
1.  He is on blood thinners so therefore he should not be taking ibuprofen #2 temporarily he can use the pain medicine 3 times daily as needed over the course of the next 5 to 6 days then he should go back to doing twice a day #3 if patient is having significant headache he should do an office follow-up

## 2019-09-22 NOTE — Telephone Encounter (Signed)
Pt made aware

## 2019-09-22 NOTE — Telephone Encounter (Signed)
Contacted patient regarding result notes and pt states that he wanted to let provider know that he fell yesterday. He does not know how he fell. Pt states he hurt his head, has a place over left eye, hurt in knee, his right palm and cracked a rib.

## 2019-09-22 NOTE — Telephone Encounter (Signed)
1.  Patient is on blood thinners-if he is having a severe headache he should go to ER for further evaluation of possible head scan If patient is having any other issues with his head he should follow-up with [place]  If the patient does get dizzy with standing up this could be a blood pressure issue we would be happy to see him on Tuesday to check this  If the patient feels he is recovering then he just needs to watch for any ongoing issues and follow-up with [place] accordingly

## 2019-09-22 NOTE — Telephone Encounter (Signed)
Pt contacted and verbalized understanding.  

## 2019-09-22 NOTE — Telephone Encounter (Signed)
-----   Message from Arnoldo Lenis, MD sent at 09/22/2019 12:33 PM EST ----- Labs received by Dr Wolfgang Phoenix. Agree with cutting torsemide to 20mg  bid. Needs to hold on taking any metolazone for now. Recheck BMET in 2 weeks   Zandra Abts MD ----- Message ----- From: Kathyrn Drown, MD Sent: 09/19/2019  12:37 PM EST To: Arnoldo Lenis, MD  Mutual patient-creatinine has jumped up on current diuretics.  2.72, was 1.52.  When I saw patient yesterday vital signs stable no sign of unstable CHF.  Given his heart condition I would prefer that you decide adjustments regarding his diuretic.  We will be happy to do follow-up lab work in several weeks.  Look forward to your input-thank you-Scott Luking primary care

## 2019-09-22 NOTE — Telephone Encounter (Signed)
Pt wants to know if he could go to 3 a day on his pain pills for the next 3 weeks instead of 2 because he is in a lot of pain all over. He states he has to taking ibuprofen 800mg  with hydrocodone to help relieve the pain

## 2019-09-22 NOTE — Telephone Encounter (Signed)
Discussed with pt. Pt declined appt or to go ED. He states he is fine not having any headache and his head is just sore when he touches it. He just wanted to let dr scott know about the fall and to let dr scott know he appreciates every thing he has done.

## 2019-09-24 ENCOUNTER — Telehealth: Payer: Self-pay | Admitting: Family Medicine

## 2019-09-24 ENCOUNTER — Encounter: Payer: Self-pay | Admitting: *Deleted

## 2019-09-24 NOTE — Telephone Encounter (Signed)
Discussed with pt. Pt verbalized understanding.  °

## 2019-09-24 NOTE — Telephone Encounter (Signed)
So at this point I would bump up the dose 2 fluid pills in the morning 1 at noon time Give [place] feedback within the next 5 to 6 days on how the swelling is doing We may have to bump it up to 2 in the morning and 2 at noon time If they are getting worse give [place] feedback sooner

## 2019-09-24 NOTE — Telephone Encounter (Signed)
Message for Nurse Abigail Butts- please call patient didn't want to leave message with front staff please call [phone] about his medication

## 2019-09-24 NOTE — Telephone Encounter (Signed)
Pt states he use to take toresmide 20mg  3 qam and 2 in the evenings. Due to bw dr scott spoke cardiologist and decreased it to one bid. Pt states his feet are swollen and yesterday started having fluid leak out of his legs. Has to sit in recliner to sleep cannot lay down.

## 2019-09-30 ENCOUNTER — Other Ambulatory Visit (HOSPITAL_COMMUNITY): Payer: Medicare Other

## 2019-10-07 DIAGNOSIS — Z79899 Other long term (current) drug therapy: Secondary | ICD-10-CM | POA: Diagnosis not present

## 2019-10-07 DIAGNOSIS — I10 Essential (primary) hypertension: Secondary | ICD-10-CM | POA: Diagnosis not present

## 2019-10-08 ENCOUNTER — Other Ambulatory Visit: Payer: Self-pay | Admitting: Family Medicine

## 2019-10-08 LAB — BASIC METABOLIC PANEL
BUN/Creatinine Ratio: 18 (ref 10–24)
BUN: 28 mg/dL — ABNORMAL HIGH (ref 8–27)
CO2: 26 mmol/L (ref 20–29)
Calcium: 8.6 mg/dL (ref 8.6–10.2)
Chloride: 98 mmol/L (ref 96–106)
Creatinine, Ser: 1.53 mg/dL — ABNORMAL HIGH (ref 0.76–1.27)
GFR calc Af Amer: 53 mL/min/{1.73_m2} — ABNORMAL LOW (ref >59)
GFR calc non Af Amer: 46 mL/min/{1.73_m2} — ABNORMAL LOW (ref >59)
Glucose: 137 mg/dL — ABNORMAL HIGH (ref 65–99)
Potassium: 5.1 mmol/L (ref 3.5–5.2)
Sodium: 140 mmol/L (ref 134–144)

## 2019-10-09 NOTE — Progress Notes (Signed)
Referring Provider: Kathyrn Drown, MD Primary Care Physician:  Kathyrn Drown, MD Primary GI Physician: Dr. Oneida Alar  Chief Complaint  Patient presents with  . Follow-up    pt says dentist have him medication that burned his esophagus    HPI:   [name] [name] is a 69 y.o. male presenting today for follow-up of IDA likely secondary to UGI bleed as well as referral from Dr. Wolfgang Phoenix for esophagitis.  Patient has multiple comorbidities including AICD (plans for upgrade to CRT D), CAD status post CABG, A. fib, aortic stenosis status post transcatheter aortic valve replacement in March 2019, morbid obesity, sleep apnea.  GI history significant for upper GI bleed in February 2020 with EGD at that time revealing nonbleeding cratered gastric ulcer, 12 mm in size, with numerous erosions surrounding. Also noted on EGD was mild Schatzki ring and small hiatal hernia.  Pathology with reactive gastropathy, no H. pylori.  Hemoglobin had dropped to 9 at that time.  Also with history of constipation on Linzess 290 mcg daily. No prior colonoscopy.   Last seen in our office on 11/13/2018 for hospital follow-up.  Constipation was doing well.  Denied rectal bleeding or melena, heartburn, abdominal pain, or dysphagia.  Labs on October 09, 2018 with PCP following admission with hemoglobin 10.1, iron low at 31, iron saturation low at 7%, ferritin 36.  Also found to be heme positive on 11/06/18.  He needed EGD for ulcer surveillance but would also plan for colonoscopy at the same time due to IDA and heme positive stool.   Ultimately, patient was referred to [facility] due anesthesia stating patient was too high risk to have TCS at [facility].  Patient saw [facility] on 06/24/2019 to discuss scheduling TCS/EGD.  Procedure scheduled for 08/04/2019.  However, due to hospitalization, multiple medical issues (cardiac, electrolyte imbalance, and Covid), his procedure was canceled.  Has not been rescheduled due to scheduling  difficulties secondary to COVID-19 pandemic. Dr. Hilarie Fredrickson recommended office visit prior to rescheduling.  I do not see that an office visit is arranged.  Reviewed recent PCP note with Dr. Wolfgang Phoenix on 09/18/19.  Patient had a tooth abscess and was taking Keflex and clindamycin and was having burning in his throat.  Pain when swallowing.  Reported weight loss due to this.  Advised to use PPI twice daily and add Carafate.  Start Diflucan daily x7 days plus nystatin oral solution 4 times daily for probable yeast esophagitis.  Update labs and referred to GI.  Today:  No longer having burning in his esophagus. Started when taking antibiotics for tooth abscess. When it all started, he had white patches along his gum line and in the roof of his mouth. This has resolved. Currently on soft foods for additional 2 weeks due to his teeth. Had one tooth that had to be pulled and another fell out. Likely will have 4 more teeth that will come out. No dysphagia. Notes he does have to cut his potassium in half. Otherwise, no trouble with pills, foods, or liquids. No GERD symptoms. No abdominal pain. No black stools or blood in the stools. Has stopped his aspirin. Got the OK from PCP. Not taking iron for at least 2 months. States he needs to ask Dr. Wolfgang Phoenix if he needs to take iron. Just had labs drawn this week. No nausea or vomiting.   BMs daily. Taking Linzess daily.  No constipation or diarrhea. Has had some weight loss since everything started with  his mouth as well as having Covid. Appetite is still good. Intake is limited at this time due to his teeth.  No prior colonoscopy.   Was taking ibuprofen given by the dentist. Has stopped.  No other NSAIDs.  Only taking Protonix about once a week. This is for a "stomach ache."   Does not have follow-up scheduled with [facility].   Past Medical History:  Diagnosis Date  . AICD (automatic cardioverter/defibrillator) present 05/18/2016  . Anginal pain ([place])    occ none  recent  . Aortic stenosis   . Arthritis   . CAD (coronary artery disease)   . Cardiomyopathy, ischemic   . CHF (congestive heart failure) ([place])   . Chronic back pain   . Diabetes mellitus without complication ([place])   . Elevated PSA   . History of kidney stones   . Hyperlipidemia   . Morbid obesity ([place])   . Myocardial infarction ([place])   . OSA (obstructive sleep apnea)    does not wear CPAP, -order run out told need to redo  test  . Paroxysmal atrial fibrillation ([place]) 09/29/2018   This was found on event monitor October 2019  . 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 APPENDECTOMY    . BIOPSY  10/02/2018   Procedure: BIOPSY;  Surgeon: Daneil Dolin, MD;  Location: [facility];  Service: Endoscopy;;  gastric  . CARDIAC CATHETERIZATION     stents  . CAROTID STENT     denies carotid stent  . CATARACT EXTRACTION, BILATERAL    . CORONARY ARTERY BYPASS GRAFT  12/23/1999   LIMA to LAD, SVG to ramus, sequential SVG to OM2-RPL, sequential SVG to AM-PDA  . EP IMPLANTABLE DEVICE N/A 05/18/2016   Procedure: ICD Implant;  Surgeon: Will Meredith Leeds, MD;  Location: [facility];  Service: Cardiovascular;  Laterality: N/A;  . ESOPHAGOGASTRODUODENOSCOPY (EGD) WITH PROPOFOL N/A 10/02/2018   Procedure: ESOPHAGOGASTRODUODENOSCOPY (EGD) WITH PROPOFOL;  Surgeon: Daneil Dolin, MD; nonbleeding cratered gastric ulcer with no stigmata of bleeding, erosive gastropathy s/p biopsy, normal duodenum.  Findings suspicious for occult NSAID use.  Pathology with reactive gastropathy, no H. pylori.  . EYE SURGERY    . ingrown toenail N/A   . LEFT HEART CATHETERIZATION WITH CORONARY/GRAFT ANGIOGRAM N/A 12/21/2014    Procedure: LEFT HEART CATHETERIZATION WITH Beatrix Fetters;  Surgeon: Belva Crome, MD;  Location: [facility] C[facility] LAB;  Service: Cardiovascular;  Laterality: N/A;  . NASAL SEPTOPLASTY W/ TURBINOPLASTY Bilateral 04/25/2017   NASAL SEPTOPLASTY WITH TURBINATE REDUCTION/notes 04/25/2017  . NASAL SEPTOPLASTY W/ TURBINOPLASTY Bilateral 04/25/2017   Procedure: NASAL SEPTOPLASTY WITH TURBINATE REDUCTION;  Surgeon: Leta Baptist, MD;  Location: [place];  Service: ENT;  Laterality: Bilateral;  . RIGHT/LEFT HEART CATH AND CORONARY/GRAFT ANGIOGRAPHY N/A 10/05/2017   Procedure: RIGHT/LEFT HEART CATH AND CORONARY/GRAFT ANGIOGRAPHY;  Surgeon: Sherren Mocha, MD;  Location: [place] CV LAB;  Service: Cardiovascular;  Laterality: N/A;  . TEE WITHOUT CARDIOVERSION N/A 11/20/2017   Procedure: TRANSESOPHAGEAL ECHOCARDIOGRAM (TEE);  Surgeon: Sherren Mocha, MD;  Location: [place];  Service: Open Heart Surgery;  Laterality: N/A;  . TONSILLECTOMY    . TRANSCATHETER AORTIC VALVE REPLACEMENT, TRANSFEMORAL N/A 11/20/2017  Procedure: TRANSCATHETER AORTIC VALVE REPLACEMENT, TRANSFEMORAL;  Surgeon: Sherren Mocha, MD;  Location: [facility];  Service: Open Heart Surgery;  Laterality: N/A;    Current Outpatient Medications  Medication Sig Dispense Refill  . acetaminophen (TYLENOL) 500 MG tablet Take 1 tablet (500 mg total) by mouth 2 (two) times daily. 14 tablet 0  . aspirin EC 81 MG EC tablet Take 1 tablet (81 mg total) by mouth daily.    . blood glucose meter kit and supplies KIT Dispense based on patient and insurance preference. Test  up to four times daily as directed.Dx: E11.9 1 each 0  . calcium carbonate (TUMS - DOSED IN MG ELEMENTAL CALCIUM) 500 MG chewable tablet Chew 2 tablets by mouth 2 (two) times daily as needed for indigestion or heartburn.     . carvedilol (COREG) 3.125 MG tablet TAKE (1) TABLET BY MOUTH TWICE DAILY WITH A MEAL. 60 tablet 2  . Ferrous Sulfate (FEROSUL PO) Take by mouth.    . FIBER PO Take 5 capsules by  mouth daily.    . fluconazole (DIFLUCAN) 200 MG tablet Take 1 tablet (200 mg total) by mouth daily. 7 tablet 0  . HYDROcodone-acetaminophen (NORCO) 10-325 MG tablet Take one tablet po twice daily prn pain 60 tablet 0  . ibuprofen (ADVIL) 800 MG tablet Take 800 mg by mouth every 8 (eight) hours as needed.    . insulin aspart (NOVOLOG FLEXPEN) 100 UNIT/ML FlexPen Inject 3 Units into the skin 3 (three) times daily with meals. 15 mL 0  . Insulin Detemir (LEVEMIR FLEXTOUCH) 100 UNIT/ML Pen Inject 40 Units into the skin at bedtime. 15 mL 0  . isosorbide dinitrate (ISORDIL) 30 MG tablet Take 1 tablet (30 mg total) by mouth daily. 30 tablet 1  . linaclotide (LINZESS) 290 MCG CAPS capsule TAKE (1) CAPSULE BY MOUTH EACH MORNING BEFORE BREAKFAST. 30 capsule 2  . Menthol-Methyl Salicylate (GOODSENSE MUSCLE RUB) 8-30 % CREA Apply 1 application topically daily as needed (muscle pain).    Marland Kitchen metolazone (ZAROXOLYN) 2.5 MG tablet TAKE ONE TABLET BY MOUTH DAILY. 30 tablet 3  . Multiple Vitamins-Minerals (CENTRUM SILVER PO) Take 1 tablet by mouth daily.     . nitroGLYCERIN (NITROSTAT) 0.4 MG SL tablet PLACE 1 TABLET UNDER TONGUE FOR CHEST PAIN. MAY REPEAT EVERY 5 MIN UP TO 3 DOSES-NO RELIEF,CALL 911. 25 tablet 6  . nystatin (MYCOSTATIN) 100000 UNIT/ML suspension Take one tsp (7m) qid. Swish and swallow. Take for 10 days 180 mL 2  . pantoprazole (PROTONIX) 40 MG tablet Take 1 tablet (40 mg total) by mouth 2 (two) times daily. 60 tablet 3  . polycarbophil (FIBERCON) 625 MG tablet Take 2 tablets (1,250 mg total) by mouth daily with lunch. Hold if you have diarrhea 30 tablet 0  . potassium chloride SA (KLOR-CON M20) 20 MEQ tablet Take 2 tablets (40 mEq total) by mouth 2 (two) times daily. 120 tablet 1  . rivaroxaban (XARELTO) 20 MG TABS tablet Take 1 tablet (20 mg total) by mouth daily with supper. 30 tablet 6  . rosuvastatin (CRESTOR) 40 MG tablet TAKE ONE TABLET BY MOUTH DAILY. 90 tablet 0  . spironolactone (ALDACTONE)  25 MG tablet Take 1 tablet (25 mg total) by mouth daily. 30 tablet 1  . sucralfate (CARAFATE) 1 g tablet Take 1 tablet (1 g total) by mouth 4 (four) times daily. 120 tablet 0  . SURE COMFORT PEN NEEDLES 31G X 8 MM MISC USING 4 TIMES DAILY. 100 each 0  .  tamsulosin (FLOMAX) 0.4 MG CAPS capsule Take 1 capsule (0.4 mg total) by mouth daily. 30 capsule 5  . torsemide (DEMADEX) 20 MG tablet Take one tablet po in the morning and one tablet po in the evening 120 tablet 1  . traMADol (ULTRAM) 50 MG tablet TAKE 1 TABLET BY MOUTH EVERY 6 TO 8 HOURS AS NEEDED FOR HEADACHE. 20 tablet 0  . trolamine salicylate (ASPERCREME) 10 % cream Apply 1 application topically 2 (two) times daily as needed for muscle pain.      Current Facility-Administered Medications  Medication Dose Route Frequency Provider Last Rate Last Admin  . clotrimazole-betamethasone (LOTRISONE) cream   Topical BID Irine Seal, MD        Allergies as of 10/10/2019 - Review Complete 10/10/2019  Allergen Reaction Noted  . Dyflex-g [dyphylline-guaifenesin] Hives 05/03/2013  . Eliquis [apixaban] Other (See Comments) 09/29/2018    Family History  Adopted: Yes  Problem Relation Age of Onset  . Diabetes Mother   . CAD Mother   . Colon cancer Neg Hx   . Colon polyps Neg Hx     Social History   Socioeconomic History  . Marital status: Married    Spouse name: Not on file  . Number of children: Not on file  . Years of education: Not on file  . Highest education level: Not on file  Occupational History  . Occupation: retired    Comment: [place]    Comment: [employer]  Tobacco Use  . Smoking status: Former Smoker    Quit date: 08/29/1999    Years since quitting: 20.1  . Smokeless tobacco: Never Used  . Tobacco comment: couldn't remember date  Substance and Sexual Activity  . Alcohol use: No    Alcohol/week: 0.0 standard drinks  . Drug use: No  . Sexual activity: Not Currently  Other Topics Concern  . Not on file    Social History Narrative  . Not on file   Social Determinants of Health   Financial Resource Strain:   . Difficulty of Paying Living Expenses: Not on file  Food Insecurity:   . Worried About Charity fundraiser in the Last Year: Not on file  . Ran Out of Food in the Last Year: Not on file  Transportation Needs:   . Lack of Transportation (Medical): Not on file  . Lack of Transportation (Non-Medical): Not on file  Physical Activity:   . Days of Exercise per Week: Not on file  . Minutes of Exercise per Session: Not on file  Stress:   . Feeling of Stress : Not on file  Social Connections:   . Frequency of Communication with Friends and Family: Not on file  . Frequency of Social Gatherings with Friends and Family: Not on file  . Attends Religious Services: Not on file  . Active Member of Clubs or Organizations: Not on file  . Attends Archivist Meetings: Not on file  . Marital Status: Not on file    Review of Systems: Gen: Denies fever or chills. If staying on his feet too long or working too hard, he will get "swimmy headed." Recently fells 3 weeks ago and likely fractured his left rib. He did hit his head. Did not lose consciousness. No headaches. No vision changes.  CV: Intermittent chest pain at baseline. No palpitations.  Resp: No shortness of breath at rest. No cough. Admits to SOB with exertion.  GI: See HPI Derm: Intermittent tinea cruris.  Psych: Denies  depression or anxiety Heme: Denies bruising or bleeding.   Physical Exam: BP 116/75   Pulse (!) 121   Temp (!) 96.8 F (36 C) (Temporal)   Ht 5' 10.5" (1.791 m)   Wt 259 lb (117.5 kg)   BMI 36.64 kg/m  General:   Alert and oriented. No distress noted. Pleasant and cooperative. Appears somewhat unsteady on his feet. Able to get on exam table with minimal help.  Head:  Normocephalic and atraumatic. Eyes:  Conjuctiva clear without scleral icterus. Heart:  S1, S2 present without murmurs appreciated. Lungs:   Clear to auscultation bilaterally. No wheezes, rales, or rhonchi. No distress.  Abdomen:  +BS, soft, and  non-distended. Mild tenderness noted in LUQ related to recent fall.  Also with tenderness noted along left rib cage.  Per patient, this is improving. No rebound or guarding. No HSM or masses noted.  Msk:  Symmetrical without gross deformities. Normal posture. Extremities:  Without 2+ bilateral lower extremity pitting edema below the knees. Neurologic:  Alert and  oriented x4 Psych:  Normal mood and affect.

## 2019-10-10 ENCOUNTER — Encounter: Payer: Self-pay | Admitting: Gastroenterology

## 2019-10-10 ENCOUNTER — Ambulatory Visit (INDEPENDENT_AMBULATORY_CARE_PROVIDER_SITE_OTHER): Payer: Medicare Other | Admitting: Gastroenterology

## 2019-10-10 ENCOUNTER — Other Ambulatory Visit: Payer: Self-pay

## 2019-10-10 VITALS — BP 116/75 | HR 121 | Temp 96.8°F | Ht 70.5 in | Wt 259.0 lb

## 2019-10-10 DIAGNOSIS — K209 Esophagitis, unspecified without bleeding: Secondary | ICD-10-CM | POA: Insufficient documentation

## 2019-10-10 DIAGNOSIS — Z8711 Personal history of peptic ulcer disease: Secondary | ICD-10-CM

## 2019-10-10 DIAGNOSIS — Z1211 Encounter for screening for malignant neoplasm of colon: Secondary | ICD-10-CM

## 2019-10-10 DIAGNOSIS — K5900 Constipation, unspecified: Secondary | ICD-10-CM

## 2019-10-10 DIAGNOSIS — Z8719 Personal history of other diseases of the digestive system: Secondary | ICD-10-CM | POA: Diagnosis not present

## 2019-10-10 DIAGNOSIS — Z7189 Other specified counseling: Secondary | ICD-10-CM | POA: Insufficient documentation

## 2019-10-10 NOTE — Assessment & Plan Note (Signed)
Suspect patient recently had Candida esophagitis secondary to antibiotics for tooth abscess.  Symptoms have since resolved with Diflucan and nystatin oral solution.  He was advised to continue to monitor for return of symptoms.  Of note, he is overdue for surveillance EGD for gastric ulcer that was identified in 2020 as discussed below.  Continue to monitor. Refer back to [facility] for surveillance EGD.  Follow-up in 1 year.  Call if questions or concerns prior.

## 2019-10-10 NOTE — Assessment & Plan Note (Addendum)
69 year old male with no prior colonoscopy.  He does have history of IDA in the setting of upper GI bleed in February 2020, but this has now resolved.  Most recent labs completed on 09/18/2019 with hemoglobin 14 and ferritin 466.  Patient has since discontinued iron.  Denies bright red blood per rectum or melena.  Constipation is well managed on Linzess 290 mcg daily.  Due to multiple comorbidities, patient was referred to [facility] last year for TCS/EGD; however, due to hospitalization with cardiac issues, electrolyte imbalance, and COVID, procedures were cancelled and never rescheduled.    Refer back to [facility] for TCS.  Follow-up in 1 year. Call if questions or concerns prior.

## 2019-10-10 NOTE — Patient Instructions (Addendum)
Resume taking Protonix 40 mg daily 30 minutes before breakfast.   Continue Linzess 290 mcg daily.   Please call [facility] to schedule follow-up appointment in order to have colonoscopy and upper endoscopy arranged. We will also go ahead and place another referral to ensure they are aware we are requesting for you to be seen.   We will see you back in 1 year. Do not hesitate to call if you have questions or concerns prior.   Aliene Altes, PA-C [facility]

## 2019-10-10 NOTE — Assessment & Plan Note (Addendum)
History of IDA with upper GI bleed in February 2020.  EGD at that time revealed nonbleeding cratered gastric ulcer, 12 mm in size, with numerous erosions surrounding.  Findings suspicious for occult NSAID use.  Pathology with reactive gastropathy, no H. Pylori.  Patient has not had surveillance EGD.  He was referred to [facility] last year due to [facility] anesthesia feeling he was too high risk for procedures.  Patient was scheduled for EGD; however, this was canceled due to hospitalization with cardiac issues, electrolyte imbalance, and COVID-19 and was not rescheduled.  He is currently doing well.  Denies bright red blood per rectum or melena.  No longer taking aspirin.  No other NSAIDs.  Denies abdominal pain or GERD symptoms.  He did recently have what I suspect was Candida esophagitis secondary to medications for tooth abscess. Odynophagia has since resolved with Diflucan and nystatin.  Of note, he has only taking Protonix as needed for "stomach ache" which is about once a week or less.  Refer back to  GI for surveillance EGD.  He will also have a screening colonoscopy at that time. Advised he resume taking Protonix 40 mg daily 30 minutes before breakfast at least until surveillance EGD is completed.  Follow-up in 1 year. Call if questions or concerns prior.

## 2019-10-10 NOTE — Assessment & Plan Note (Signed)
Well managed on Linzess 290 mcg daily.  No alarm symptoms.  Continue Linzess 2090 mcg daily.  Follow-up in 1 year.

## 2019-10-11 ENCOUNTER — Other Ambulatory Visit: Payer: Self-pay | Admitting: Family Medicine

## 2019-10-16 ENCOUNTER — Ambulatory Visit: Payer: Medicare Other | Admitting: Family Medicine

## 2019-10-17 ENCOUNTER — Encounter: Payer: Self-pay | Admitting: Nurse Practitioner

## 2019-10-17 ENCOUNTER — Ambulatory Visit (INDEPENDENT_AMBULATORY_CARE_PROVIDER_SITE_OTHER): Payer: Medicare Other | Admitting: Nurse Practitioner

## 2019-10-17 VITALS — BP 98/62 | HR 60 | Temp 97.9°F | Ht 70.0 in | Wt 263.0 lb

## 2019-10-17 DIAGNOSIS — K279 Peptic ulcer, site unspecified, unspecified as acute or chronic, without hemorrhage or perforation: Secondary | ICD-10-CM | POA: Diagnosis not present

## 2019-10-17 DIAGNOSIS — I255 Ischemic cardiomyopathy: Secondary | ICD-10-CM

## 2019-10-17 DIAGNOSIS — Z1211 Encounter for screening for malignant neoplasm of colon: Secondary | ICD-10-CM

## 2019-10-17 NOTE — Patient Instructions (Signed)
If you are age 69 or older, your body mass index should be between 23-30. Your Body mass index is 37.74 kg/m. If this is out of the aforementioned range listed, please consider follow up with your Primary Care Provider.  If you are age 1 or younger, your body mass index should be between 19-25. Your Body mass index is 37.74 kg/m. If this is out of the aformentioned range listed, please consider follow up with your Primary Care Provider.   Will call with further instructions after speaking with Dr. Hilarie Fredrickson.

## 2019-10-17 NOTE — Progress Notes (Signed)
IMPRESSION and PLAN:     69 yo male with a pmh significant for but not limited to morbid obesity, DM, HTN, hyperlipidemia, CAD/CABG and subsequent PCI,  ischemic cardiomyopathy 15% on July 2020 echo  / ICD / LBBB post TAVR, chronic anticoagulation and hx of PUD  # hx of PUD February 2020 -Non-bleeding cratered gastric ulcer with no stigmata of bleeding was found at the pyloroplasty. The lesion was 12 mm in largest dimension. Clean base. Surrounding mucosal deformity with numerous 3 to 4 mm erosions. No obvious infiltrating process.  Biopsies compatible with reactive gastropathy.  No H. pylori. -Follow-up EGD to document healing was scheduled a few months back but postponed due to [place] pandemic -Since we last saw patient in October 2020 he has been rehospitalized.  Admitted with V. Tach / severe hypokalemia.   -He remains at very high risk for endoscopic procedures.  Post TAVR valve he developed left bundle branch block,  scheduled for a BIV ICD upgrade in early March. Furthermore, his remaining top teeth are very loose, he has already lost several.  -Ideally we would proceed with an EGD to document ulcer healing but the risk may outweigh the benefit in his case.  This was discussed with the patient, he understands and agrees.  I told patient that I will discuss with his primary GI, Dr. Hilarie Fredrickson.  If he feels strongly about proceeding with the EGD then I will call patient to make arrangements  # Colon cancer screening -No bowel changes or overt GI bleeding.  Again, I think patient is at extremely high risk for procedures, especially one being done for screening.  Patient understands the risk of not proceeding (polyps, colon cancer).    # COVID-19 infection late December  HPI:    Primary GI:  Dr. Hilarie Fredrickson  Chief complaint : none. Here to reschedule EGD / colonoscopy   [name] [name] is a 69 y.o. male Data Reviewed:  [name] is a 69 year old male with multiple medical  problems . He is  here to discuss getting rescheduled for EGD and colonoscopy.  I saw him late October 2020 as a referral from Dr. Gala Romney.  He had been hospitalized in [place] with an upper GI bleed.  A nonbleeding gastric ulcer was found on EGD.  He deemed high risk for procedures and sent to [place] for a follow-up EGD to document healing as well as colonoscopy for colon cancer screening.  Procedures scheduled but then then postponed due to Covid 19 pandemic.  He is here to get rescheduled for procedures but since last visit in October patient has been rehospitalized with V. Tach, severe hypokalemia, AKI,  incidentally found to have Covid19 at the time. Here with wife. Patient denies any overt GI bleeding / black stools. No abdominal pain or other GI symptoms.   Review of systems:     No chest pain, no SOB, no fevers, no urinary sx   Past Medical History:  Diagnosis Date  . AICD (automatic cardioverter/defibrillator) present 05/18/2016  . Anginal pain ([place])    occ none recent  . Aortic stenosis   . Arthritis   . CAD (coronary artery disease)   . Cardiomyopathy, ischemic   . CHF (congestive heart failure) ([place])   . Chronic back pain   . Diabetes mellitus without complication ([place])   . Elevated PSA   . History of kidney stones   . Hyperlipidemia   . Morbid obesity ([place])   .  Myocardial infarction ([place])   . OSA (obstructive sleep apnea)    does not wear CPAP, -order run out told need to redo  test  . Paroxysmal atrial fibrillation ([place]) 09/29/2018   This was found on event monitor October 2019  . 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 traMADol (ULTRAM) 50 MG tablet TAKE 1 TABLET BY MOUTH EVERY 6 TO 8 HOURS AS NEEDED FOR HEADACHE. 20 tablet 2  . acetaminophen (TYLENOL) 500 MG tablet Take 1 tablet (500 mg total) by mouth 2 (two) times daily. 14 tablet 0  . blood glucose meter kit and supplies KIT Dispense based on patient and insurance preference. Test  up to four times daily as directed.Dx: E11.9 1 each 0  . calcium carbonate (TUMS - DOSED IN MG ELEMENTAL CALCIUM) 500 MG chewable tablet Chew 2 tablets by mouth 2 (two) times daily as needed for indigestion or heartburn.     . carvedilol (COREG) 3.125 MG tablet TAKE (1) TABLET BY MOUTH TWICE DAILY WITH A MEAL. 60 tablet 2  . FIBER PO Take 5 capsules by mouth daily.    Marland Kitchen HYDROcodone-acetaminophen (NORCO) 10-325 MG tablet Take one tablet po twice daily prn pain 60 tablet 0  . insulin aspart (NOVOLOG FLEXPEN) 100 UNIT/ML FlexPen Inject 3 Units into the skin 3 (three) times daily with meals. (Patient taking differently: Inject 60 Units into the skin 2 (two) times daily. ) 15 mL 0  . Insulin Detemir (LEVEMIR FLEXTOUCH) 100 UNIT/ML Pen Inject 40 Units into the skin at bedtime. (Patient taking differently: Inject 60 Units into the skin at bedtime. ) 15 mL 0  . isosorbide dinitrate (ISORDIL) 30 MG tablet Take 1 tablet (30 mg total) by mouth daily. 30 tablet 1  . linaclotide (LINZESS) 290 MCG CAPS capsule TAKE (1) CAPSULE BY MOUTH EACH MORNING BEFORE BREAKFAST. 30 capsule 2  . Menthol-Methyl Salicylate (GOODSENSE MUSCLE RUB) 8-30 % CREA Apply 1 application topically daily as needed (muscle pain).    . Multiple Vitamins-Minerals (CENTRUM SILVER PO) Take 1 tablet by mouth daily.     . nitroGLYCERIN (NITROSTAT) 0.4 MG SL tablet PLACE 1 TABLET UNDER TONGUE FOR CHEST PAIN. MAY REPEAT EVERY 5 MIN UP TO 3 DOSES-NO RELIEF,CALL 911. 25  tablet 6  . nystatin (MYCOSTATIN) 100000 UNIT/ML suspension Take one tsp (21m) qid. Swish and swallow. Take for 10 days 180 mL 2  . pantoprazole (PROTONIX) 40 MG tablet Take 1 tablet (40 mg total) by mouth 2 (two) times daily. (Patient taking differently: Take 40 mg by mouth as needed. ) 60 tablet 3  . potassium chloride SA (KLOR-CON M20) 20 MEQ tablet Take 2 tablets (40 mEq total) by mouth 2 (two) times daily. 120 tablet 1  . rivaroxaban (XARELTO) 20 MG TABS tablet Take 1 tablet (20 mg total) by mouth daily with supper. 30 tablet 6  . rosuvastatin (CRESTOR) 40 MG tablet TAKE ONE TABLET BY MOUTH DAILY. 90 tablet 0  . sucralfate (CARAFATE) 1 g tablet Take 1 tablet (  1 g total) by mouth 4 (four) times daily. 120 tablet 0  . SURE COMFORT PEN NEEDLES 31G X 8 MM MISC USING 4 TIMES DAILY. 100 each 0  . tamsulosin (FLOMAX) 0.4 MG CAPS capsule Take 1 capsule (0.4 mg total) by mouth daily. 30 capsule 5  . torsemide (DEMADEX) 20 MG tablet Take one tablet po in the morning and one tablet po in the evening 120 tablet 1  . trolamine salicylate (ASPERCREME) 10 % cream Apply 1 application topically 2 (two) times daily as needed for muscle pain.      Current Facility-Administered Medications  Medication Dose Route Frequency Provider Last Rate Last Admin  . clotrimazole-betamethasone (LOTRISONE) cream   Topical BID Irine Seal, MD        Physical Exam:     BP 98/62   Pulse 60   Temp 97.9 F (36.6 C)   Ht 5' 10"  (1.778 m)   Wt 263 lb (119.3 kg)   BMI 37.74 kg/m   GENERAL:  Pleasant male in NAD PSYCH: : Cooperative, normal affect CARDIAC:  RRR, 2+ BLE edema PULM: Normal respiratory effort, lungs CTA bilaterally, no wheezing ABDOMEN:  Nondistended, soft, nontender. No obvious masses, no hepatomegaly,  normal bowel sounds SKIN:  turgor, no lesions seen NEURO: Alert and oriented x 3, no focal neurologic deficits  I spent 30 minutes total reviewing records, obtaining history, performing exam, counseling  patient and documenting visit / findings.   Tye Savoy , NP 10/17/2019, 11:47 AM

## 2019-10-24 ENCOUNTER — Encounter: Payer: Self-pay | Admitting: Family Medicine

## 2019-10-24 ENCOUNTER — Ambulatory Visit (INDEPENDENT_AMBULATORY_CARE_PROVIDER_SITE_OTHER): Payer: Medicare Other | Admitting: Family Medicine

## 2019-10-24 ENCOUNTER — Other Ambulatory Visit: Payer: Self-pay

## 2019-10-24 ENCOUNTER — Telehealth: Payer: Self-pay | Admitting: *Deleted

## 2019-10-24 VITALS — BP 134/86 | Temp 97.5°F | Wt 262.0 lb

## 2019-10-24 DIAGNOSIS — I480 Paroxysmal atrial fibrillation: Secondary | ICD-10-CM

## 2019-10-24 DIAGNOSIS — E1122 Type 2 diabetes mellitus with diabetic chronic kidney disease: Secondary | ICD-10-CM | POA: Diagnosis not present

## 2019-10-24 DIAGNOSIS — G4733 Obstructive sleep apnea (adult) (pediatric): Secondary | ICD-10-CM | POA: Diagnosis not present

## 2019-10-24 DIAGNOSIS — E1121 Type 2 diabetes mellitus with diabetic nephropathy: Secondary | ICD-10-CM

## 2019-10-24 DIAGNOSIS — N182 Chronic kidney disease, stage 2 (mild): Secondary | ICD-10-CM | POA: Diagnosis not present

## 2019-10-24 DIAGNOSIS — I255 Ischemic cardiomyopathy: Secondary | ICD-10-CM

## 2019-10-24 DIAGNOSIS — I10 Essential (primary) hypertension: Secondary | ICD-10-CM | POA: Diagnosis not present

## 2019-10-24 MED ORDER — TORSEMIDE 20 MG PO TABS: ORAL_TABLET | ORAL | 1 refills | Status: DC

## 2019-10-24 MED ORDER — HYDROCODONE-ACETAMINOPHEN 10-325 MG PO TABS: ORAL_TABLET | ORAL | 0 refills | Status: DC

## 2019-10-24 NOTE — Telephone Encounter (Signed)
Followed up with wife (speak to her per pt). Informed her to call and speak w/ dentist about what may be needed in immediate future.  Aware that we may have to push procedure date back for them to handle acute dental needs.  Wife will call them and let me know by Monday plan.

## 2019-10-24 NOTE — Telephone Encounter (Signed)
Spoke to pt, who then hands phone to wife and says speak to "his Network engineer". Spoke to wife, informed pt to arrive at 10:30 next Friday for procedure. They are agreeable to plan.  Wife reports pt is having some dental issues.  2 teeth have fallen out in last several weeks d/t bone loss.  1 more is expected to fall out anytime.  He may need to see oral surgeon to discuss possible interventions.  He wants to know if this is going to be safe with upcoming procedure. Informed wife that we typically would want to hold off on certain dental procedures, post lead insertion, for specific amt of time post implant. Aware I will discuss w/ Dr. Curt Bears all call them back by Monday. They are agreeable to plan.

## 2019-10-24 NOTE — Progress Notes (Addendum)
Subjective:    Patient ID: [name] [name], male    DOB: 1950/11/11, 69 y.o.   MRN: [id number]  HPI Pt here today for follow up. Pt states that he is not sleeping at night, hard time breathing at night, legs seeping, no energy. Pt states he was suppose to be only taking 2 pain pills but he has had to take some Ibuprofen. Pt states his Nitroglycerin is not working but Hydrocodone does help out.  Pt is having a procedure done next week for heart.  This gentleman has dental pain he states his dentist is talked about removing some teeth but has been somewhat skittish because of his underlying heart condition Patient has severe cardiomyopathy has a heart procedure coming up this week Also is having increased swelling in his legs with some weeping from the legs Intermittent mild PND mild orthopnea Review of Systems  Constitutional: Negative for activity change, appetite change and fatigue.  HENT: Negative for congestion and rhinorrhea.   Respiratory: Positive for shortness of breath. Negative for cough.   Cardiovascular: Positive for leg swelling. Negative for chest pain.  Gastrointestinal: Negative for abdominal pain, nausea and vomiting.  Musculoskeletal: Positive for arthralgias and back pain.  Neurological: Negative for dizziness and headaches.  Psychiatric/Behavioral: Negative for agitation and behavioral problems.       Objective:   Physical Exam Vitals reviewed.  Constitutional:      General: He is not in acute distress. HENT:     Head: Normocephalic and atraumatic.  Eyes:     General:        Right eye: No discharge.        Left eye: No discharge.  Neck:     Trachea: No tracheal deviation.  Cardiovascular:     Rate and Rhythm: Normal rate.     Heart sounds: Normal heart sounds. No murmur.  Pulmonary:     Effort: Pulmonary effort is normal. No respiratory distress.     Breath sounds: Normal breath sounds.  Lymphadenopathy:     Cervical: No cervical adenopathy.  Skin:  General: Skin is warm and dry.  Neurological:     Mental Status: He is alert.     Coordination: Coordination normal.  Psychiatric:        Behavior: Behavior normal.   Patient has significant pedal edema with 2+ pitting edema all the way to the knees  Recent labs, cardiology notes, information was reviewed  30 minutes spent with patient between evaluating the patient discussing the plan with the patient reviewing the chart and documentation    Assessment & Plan:  #1 diabetes reportedly under decent control continue current measures avoid low sugar spells  Significant pedal edema concerning for developing congestive heart failure increase torsemide current dose 3 in the morning and 2 in the afternoon New dose 3 in the morning 2-1/2 in the afternoon if not significantly improved within the next 3 days increase it to 3 in the morning and 3 in the afternoon  Also significant dental pain and back pain patient states 2 pain pills a day is not doing enough and he was taking ibuprofen which is terrible for his kidneys therefore stop the ibuprofen bump up the pain medicine to no more than 4/day new prescription provided  Significant underlying cardiomyopathy hopefully will benefit from coming procedure warning signs regarding out right failure were discussed  Patient hesitant to do Covid vaccine I encouraged him strongly to do so and explained how the vaccine worked in [employer]  behind the vaccine  We will see him back in 4 to 6 weeks  Patient does have poor dental areas with several rotted teeth that need to be pulled he is looking into getting this done through the dentist We will send the notation to the cardiologist regarding his detention not sure if he needs predosing with amoxicillin

## 2019-10-28 ENCOUNTER — Inpatient Hospital Stay (HOSPITAL_COMMUNITY): Admission: RE | Admit: 2019-10-28 | Payer: Medicare Other | Source: Ambulatory Visit

## 2019-10-31 ENCOUNTER — Other Ambulatory Visit: Payer: Self-pay | Admitting: *Deleted

## 2019-10-31 ENCOUNTER — Encounter (HOSPITAL_COMMUNITY): Payer: Self-pay

## 2019-10-31 ENCOUNTER — Ambulatory Visit (HOSPITAL_COMMUNITY): Admit: 2019-10-31 | Payer: Medicare Other | Admitting: Cardiology

## 2019-10-31 ENCOUNTER — Telehealth: Payer: Self-pay | Admitting: Family Medicine

## 2019-10-31 ENCOUNTER — Other Ambulatory Visit: Payer: Self-pay | Admitting: Cardiology

## 2019-10-31 SURGERY — BIV ICD INSERTION CRT-D

## 2019-10-31 MED ORDER — TORSEMIDE 20 MG PO TABS: ORAL_TABLET | ORAL | 1 refills | Status: DC

## 2019-10-31 NOTE — Telephone Encounter (Signed)
Pt's out of his torsemide (DEMADEX) 20 MG tablet  Didn't take yesterday or today.  Wife states he really needs this & they have not heard back from a Dr. Grandville Silos (not sure if this is a specialist or hospitalist)   Can we send in refill today?  Please advise & call pt     [employer]

## 2019-10-31 NOTE — Telephone Encounter (Signed)
Please advise. Thank you

## 2019-10-31 NOTE — Telephone Encounter (Signed)
Refills sent to pharm and pt was notified.

## 2019-10-31 NOTE — Telephone Encounter (Signed)
May ref plus one ref

## 2019-11-01 ENCOUNTER — Other Ambulatory Visit: Payer: Self-pay | Admitting: Cardiovascular Disease

## 2019-11-04 NOTE — Progress Notes (Signed)
Addendum: Reviewed and agree with assessment and management plan. Given his multiple medical and serious comorbidity, the risk of endoscopy and colonoscopy is felt to outweigh the benefit of these tests.  Of course if there is evidence of GI bleeding or significant symptoms we can reevaluate this decision. Denton Derks, Lajuan Lines, MD

## 2019-11-07 ENCOUNTER — Other Ambulatory Visit: Payer: Self-pay | Admitting: Cardiology

## 2019-11-07 ENCOUNTER — Other Ambulatory Visit: Payer: Self-pay | Admitting: Family Medicine

## 2019-11-11 ENCOUNTER — Other Ambulatory Visit: Payer: Self-pay | Admitting: Family Medicine

## 2019-11-11 ENCOUNTER — Ambulatory Visit: Payer: Medicare Other

## 2019-11-18 ENCOUNTER — Other Ambulatory Visit: Payer: Self-pay | Admitting: Cardiology

## 2019-11-26 ENCOUNTER — Inpatient Hospital Stay (HOSPITAL_COMMUNITY)
Admission: EM | Admit: 2019-11-26 | Discharge: 2019-12-03 | DRG: 291 | Disposition: A | Discharge status: Hospice | Payer: Medicare Other | Attending: Internal Medicine | Admitting: Internal Medicine

## 2019-11-26 ENCOUNTER — Encounter (HOSPITAL_COMMUNITY): Payer: Self-pay | Admitting: Emergency Medicine

## 2019-11-26 ENCOUNTER — Emergency Department (HOSPITAL_COMMUNITY): Payer: Medicare Other

## 2019-11-26 ENCOUNTER — Other Ambulatory Visit: Payer: Self-pay

## 2019-11-26 ENCOUNTER — Inpatient Hospital Stay (HOSPITAL_COMMUNITY): Payer: Medicare Other

## 2019-11-26 DIAGNOSIS — I130 Hypertensive heart and chronic kidney disease with heart failure and stage 1 through stage 4 chronic kidney disease, or unspecified chronic kidney disease: Secondary | ICD-10-CM | POA: Diagnosis present

## 2019-11-26 DIAGNOSIS — I5082 Biventricular heart failure: Secondary | ICD-10-CM | POA: Diagnosis present

## 2019-11-26 DIAGNOSIS — Z8249 Family history of ischemic heart disease and other diseases of the circulatory system: Secondary | ICD-10-CM

## 2019-11-26 DIAGNOSIS — I2582 Chronic total occlusion of coronary artery: Secondary | ICD-10-CM | POA: Diagnosis present

## 2019-11-26 DIAGNOSIS — I255 Ischemic cardiomyopathy: Secondary | ICD-10-CM | POA: Diagnosis present

## 2019-11-26 DIAGNOSIS — I200 Unstable angina: Secondary | ICD-10-CM | POA: Diagnosis not present

## 2019-11-26 DIAGNOSIS — Z515 Encounter for palliative care: Secondary | ICD-10-CM

## 2019-11-26 DIAGNOSIS — I272 Pulmonary hypertension, unspecified: Secondary | ICD-10-CM | POA: Diagnosis present

## 2019-11-26 DIAGNOSIS — Z833 Family history of diabetes mellitus: Secondary | ICD-10-CM

## 2019-11-26 DIAGNOSIS — E785 Hyperlipidemia, unspecified: Secondary | ICD-10-CM | POA: Diagnosis present

## 2019-11-26 DIAGNOSIS — I447 Left bundle-branch block, unspecified: Secondary | ICD-10-CM | POA: Diagnosis present

## 2019-11-26 DIAGNOSIS — I350 Nonrheumatic aortic (valve) stenosis: Secondary | ICD-10-CM | POA: Diagnosis present

## 2019-11-26 DIAGNOSIS — Z66 Do not resuscitate: Secondary | ICD-10-CM | POA: Diagnosis not present

## 2019-11-26 DIAGNOSIS — Z952 Presence of prosthetic heart valve: Secondary | ICD-10-CM

## 2019-11-26 DIAGNOSIS — I2510 Atherosclerotic heart disease of native coronary artery without angina pectoris: Secondary | ICD-10-CM | POA: Diagnosis present

## 2019-11-26 DIAGNOSIS — Z452 Encounter for adjustment and management of vascular access device: Secondary | ICD-10-CM

## 2019-11-26 DIAGNOSIS — R570 Cardiogenic shock: Secondary | ICD-10-CM

## 2019-11-26 DIAGNOSIS — R531 Weakness: Secondary | ICD-10-CM | POA: Diagnosis not present

## 2019-11-26 DIAGNOSIS — N183 Chronic kidney disease, stage 3 unspecified: Secondary | ICD-10-CM | POA: Diagnosis not present

## 2019-11-26 DIAGNOSIS — Z953 Presence of xenogenic heart valve: Secondary | ICD-10-CM | POA: Diagnosis not present

## 2019-11-26 DIAGNOSIS — R630 Anorexia: Secondary | ICD-10-CM | POA: Diagnosis not present

## 2019-11-26 DIAGNOSIS — E782 Mixed hyperlipidemia: Secondary | ICD-10-CM

## 2019-11-26 DIAGNOSIS — I509 Heart failure, unspecified: Secondary | ICD-10-CM

## 2019-11-26 DIAGNOSIS — I5021 Acute systolic (congestive) heart failure: Secondary | ICD-10-CM | POA: Diagnosis not present

## 2019-11-26 DIAGNOSIS — Z951 Presence of aortocoronary bypass graft: Secondary | ICD-10-CM

## 2019-11-26 DIAGNOSIS — I951 Orthostatic hypotension: Secondary | ICD-10-CM | POA: Diagnosis not present

## 2019-11-26 DIAGNOSIS — G4733 Obstructive sleep apnea (adult) (pediatric): Secondary | ICD-10-CM | POA: Diagnosis present

## 2019-11-26 DIAGNOSIS — E871 Hypo-osmolality and hyponatremia: Secondary | ICD-10-CM | POA: Diagnosis not present

## 2019-11-26 DIAGNOSIS — E876 Hypokalemia: Secondary | ICD-10-CM | POA: Diagnosis not present

## 2019-11-26 DIAGNOSIS — I252 Old myocardial infarction: Secondary | ICD-10-CM

## 2019-11-26 DIAGNOSIS — Z8616 Personal history of COVID-19: Secondary | ICD-10-CM | POA: Diagnosis not present

## 2019-11-26 DIAGNOSIS — Z95810 Presence of automatic (implantable) cardiac defibrillator: Secondary | ICD-10-CM | POA: Diagnosis present

## 2019-11-26 DIAGNOSIS — E1122 Type 2 diabetes mellitus with diabetic chronic kidney disease: Secondary | ICD-10-CM | POA: Diagnosis present

## 2019-11-26 DIAGNOSIS — I5042 Chronic combined systolic (congestive) and diastolic (congestive) heart failure: Secondary | ICD-10-CM | POA: Diagnosis present

## 2019-11-26 DIAGNOSIS — Z20822 Contact with and (suspected) exposure to covid-19: Secondary | ICD-10-CM | POA: Diagnosis present

## 2019-11-26 DIAGNOSIS — N1832 Chronic kidney disease, stage 3b: Secondary | ICD-10-CM | POA: Diagnosis not present

## 2019-11-26 DIAGNOSIS — J90 Pleural effusion, not elsewhere classified: Secondary | ICD-10-CM | POA: Diagnosis not present

## 2019-11-26 DIAGNOSIS — I493 Ventricular premature depolarization: Secondary | ICD-10-CM | POA: Diagnosis present

## 2019-11-26 DIAGNOSIS — Z794 Long term (current) use of insulin: Secondary | ICD-10-CM

## 2019-11-26 DIAGNOSIS — K5900 Constipation, unspecified: Secondary | ICD-10-CM | POA: Diagnosis present

## 2019-11-26 DIAGNOSIS — E1121 Type 2 diabetes mellitus with diabetic nephropathy: Secondary | ICD-10-CM | POA: Diagnosis present

## 2019-11-26 DIAGNOSIS — Z79899 Other long term (current) drug therapy: Secondary | ICD-10-CM

## 2019-11-26 DIAGNOSIS — I4891 Unspecified atrial fibrillation: Secondary | ICD-10-CM | POA: Diagnosis not present

## 2019-11-26 DIAGNOSIS — I5043 Acute on chronic combined systolic (congestive) and diastolic (congestive) heart failure: Secondary | ICD-10-CM | POA: Diagnosis present

## 2019-11-26 DIAGNOSIS — R279 Unspecified lack of coordination: Secondary | ICD-10-CM | POA: Diagnosis not present

## 2019-11-26 DIAGNOSIS — N184 Chronic kidney disease, stage 4 (severe): Secondary | ICD-10-CM | POA: Diagnosis present

## 2019-11-26 DIAGNOSIS — R110 Nausea: Secondary | ICD-10-CM | POA: Diagnosis not present

## 2019-11-26 DIAGNOSIS — Z888 Allergy status to other drugs, medicaments and biological substances status: Secondary | ICD-10-CM

## 2019-11-26 DIAGNOSIS — R079 Chest pain, unspecified: Secondary | ICD-10-CM | POA: Diagnosis not present

## 2019-11-26 DIAGNOSIS — I480 Paroxysmal atrial fibrillation: Secondary | ICD-10-CM | POA: Diagnosis present

## 2019-11-26 DIAGNOSIS — F411 Generalized anxiety disorder: Secondary | ICD-10-CM

## 2019-11-26 DIAGNOSIS — N179 Acute kidney failure, unspecified: Secondary | ICD-10-CM | POA: Diagnosis present

## 2019-11-26 DIAGNOSIS — R0600 Dyspnea, unspecified: Secondary | ICD-10-CM | POA: Diagnosis not present

## 2019-11-26 DIAGNOSIS — I10 Essential (primary) hypertension: Secondary | ICD-10-CM | POA: Diagnosis present

## 2019-11-26 DIAGNOSIS — R9431 Abnormal electrocardiogram [ECG] [EKG]: Secondary | ICD-10-CM | POA: Diagnosis present

## 2019-11-26 DIAGNOSIS — Z6839 Body mass index (BMI) 39.0-39.9, adult: Secondary | ICD-10-CM | POA: Diagnosis not present

## 2019-11-26 DIAGNOSIS — Z743 Need for continuous supervision: Secondary | ICD-10-CM | POA: Diagnosis not present

## 2019-11-26 DIAGNOSIS — Z7189 Other specified counseling: Secondary | ICD-10-CM

## 2019-11-26 DIAGNOSIS — I5023 Acute on chronic systolic (congestive) heart failure: Secondary | ICD-10-CM

## 2019-11-26 DIAGNOSIS — E118 Type 2 diabetes mellitus with unspecified complications: Secondary | ICD-10-CM | POA: Diagnosis not present

## 2019-11-26 DIAGNOSIS — I519 Heart disease, unspecified: Secondary | ICD-10-CM | POA: Diagnosis not present

## 2019-11-26 DIAGNOSIS — R0789 Other chest pain: Secondary | ICD-10-CM | POA: Diagnosis not present

## 2019-11-26 DIAGNOSIS — R0602 Shortness of breath: Secondary | ICD-10-CM | POA: Diagnosis not present

## 2019-11-26 DIAGNOSIS — Z87891 Personal history of nicotine dependence: Secondary | ICD-10-CM

## 2019-11-26 DIAGNOSIS — I429 Cardiomyopathy, unspecified: Secondary | ICD-10-CM | POA: Diagnosis not present

## 2019-11-26 LAB — BASIC METABOLIC PANEL
Anion gap: 10 (ref 5–15)
BUN: 53 mg/dL — ABNORMAL HIGH (ref 8–23)
CO2: 42 mmol/L — ABNORMAL HIGH (ref 22–32)
Calcium: 8.7 mg/dL — ABNORMAL LOW (ref 8.9–10.3)
Chloride: 81 mmol/L — ABNORMAL LOW (ref 98–111)
Creatinine, Ser: 3.15 mg/dL — ABNORMAL HIGH (ref 0.61–1.24)
GFR calc Af Amer: 22 mL/min — ABNORMAL LOW (ref >60)
GFR calc non Af Amer: 19 mL/min — ABNORMAL LOW (ref >60)
Glucose, Bld: 149 mg/dL — ABNORMAL HIGH (ref 70–99)
Potassium: 4.7 mmol/L (ref 3.5–5.1)
Sodium: 133 mmol/L — ABNORMAL LOW (ref 135–145)

## 2019-11-26 LAB — CBC
HCT: 44.2 % (ref 39.0–52.0)
Hemoglobin: 13.4 g/dL (ref 13.0–17.0)
MCH: 30.2 pg (ref 26.0–34.0)
MCHC: 30.3 g/dL (ref 30.0–36.0)
MCV: 99.8 fL (ref 80.0–100.0)
Platelets: 312 10*3/uL (ref 150–400)
RBC: 4.43 MIL/uL (ref 4.22–5.81)
RDW: 16.8 % — ABNORMAL HIGH (ref 11.5–15.5)
WBC: 7.2 10*3/uL (ref 4.0–10.5)
nRBC: 0 % (ref 0.0–0.2)

## 2019-11-26 LAB — BRAIN NATRIURETIC PEPTIDE: B Natriuretic Peptide: 882.5 pg/mL — ABNORMAL HIGH (ref 0.0–100.0)

## 2019-11-26 LAB — HIV ANTIBODY (ROUTINE TESTING W REFLEX): HIV Screen 4th Generation wRfx: NONREACTIVE

## 2019-11-26 LAB — CBG MONITORING, ED
Glucose-Capillary: 135 mg/dL — ABNORMAL HIGH (ref 70–99)
Glucose-Capillary: 111 mg/dL — ABNORMAL HIGH (ref 70–99)

## 2019-11-26 LAB — PROTIME-INR
INR: 2 — ABNORMAL HIGH (ref 0.8–1.2)
Prothrombin Time: 22.5 seconds — ABNORMAL HIGH (ref 11.4–15.2)

## 2019-11-26 LAB — ECHOCARDIOGRAM COMPLETE
Height: 70 in
Weight: 4409.2 oz

## 2019-11-26 LAB — HEMOGLOBIN A1C
Hgb A1c MFr Bld: 7.2 % — ABNORMAL HIGH (ref 4.8–5.6)
Mean Plasma Glucose: 159.94 mg/dL

## 2019-11-26 LAB — GLUCOSE, CAPILLARY
Glucose-Capillary: 118 mg/dL — ABNORMAL HIGH (ref 70–99)
Glucose-Capillary: 128 mg/dL — ABNORMAL HIGH (ref 70–99)

## 2019-11-26 LAB — TROPONIN I (HIGH SENSITIVITY)
Troponin I (High Sensitivity): 22 ng/L — ABNORMAL HIGH (ref <18)
Troponin I (High Sensitivity): 21 ng/L — ABNORMAL HIGH (ref <18)
Troponin I (High Sensitivity): 18 ng/L — ABNORMAL HIGH (ref <18)
Troponin I (High Sensitivity): 16 ng/L (ref <18)

## 2019-11-26 LAB — SARS CORONAVIRUS 2 (TAT 6-24 HRS): SARS Coronavirus 2: NEGATIVE

## 2019-11-26 MED ORDER — FUROSEMIDE 10 MG/ML IJ SOLN: 60.0000 mg | Freq: Two times a day (BID) | INTRAMUSCULAR | Status: DC

## 2019-11-26 MED ORDER — METOPROLOL TARTRATE 5 MG/5ML IV SOLN: 5.0000 mg | Freq: Three times a day (TID) | INTRAVENOUS | Status: DC | PRN

## 2019-11-26 MED ORDER — ACETAMINOPHEN 500 MG PO TABS
500.0000 mg | ORAL_TABLET | Freq: Two times a day (BID) | ORAL | Status: DC
  Administered 2019-11-26 – 2019-11-28 (×5): 500 mg via ORAL
  Filled 2019-11-26 (×5): qty 1

## 2019-11-26 MED ORDER — CARVEDILOL 3.125 MG PO TABS
3.1250 mg | ORAL_TABLET | Freq: Two times a day (BID) | ORAL | Status: DC
  Administered 2019-11-26: 3.125 mg via ORAL
  Filled 2019-11-26 (×3): qty 1

## 2019-11-26 MED ORDER — INSULIN DETEMIR 100 UNIT/ML ~~LOC~~ SOLN
40.0000 [IU] | Freq: Every day | SUBCUTANEOUS | Status: DC
  Administered 2019-11-26 – 2019-11-28 (×3): 40 [IU] via SUBCUTANEOUS
  Filled 2019-11-26 (×4): qty 0.4

## 2019-11-26 MED ORDER — ROSUVASTATIN CALCIUM 20 MG PO TABS
40.0000 mg | ORAL_TABLET | Freq: Every day | ORAL | Status: DC
  Administered 2019-11-26 – 2019-12-03 (×8): 40 mg via ORAL
  Filled 2019-11-26 (×3): qty 2
  Filled 2019-11-26: qty 8
  Filled 2019-11-26 (×4): qty 2

## 2019-11-26 MED ORDER — HEPARIN SODIUM (PORCINE) 5000 UNIT/ML IJ SOLN
5000.0000 [IU] | Freq: Three times a day (TID) | INTRAMUSCULAR | Status: DC
  Administered 2019-11-26: 5000 [IU] via SUBCUTANEOUS
  Filled 2019-11-26: qty 1

## 2019-11-26 MED ORDER — INSULIN ASPART 100 UNIT/ML ~~LOC~~ SOLN
3.0000 [IU] | Freq: Three times a day (TID) | SUBCUTANEOUS | Status: DC
  Administered 2019-11-26 (×3): 3 [IU] via SUBCUTANEOUS

## 2019-11-26 MED ORDER — INSULIN ASPART 100 UNIT/ML ~~LOC~~ SOLN
0.0000 [IU] | Freq: Three times a day (TID) | SUBCUTANEOUS | Status: DC
  Administered 2019-11-26: 1 [IU] via SUBCUTANEOUS
  Administered 2019-11-27: 2 [IU] via SUBCUTANEOUS
  Administered 2019-11-27: 1 [IU] via SUBCUTANEOUS
  Administered 2019-11-30: 2 [IU] via SUBCUTANEOUS
  Administered 2019-12-01: 1 [IU] via SUBCUTANEOUS
  Administered 2019-12-01 (×2): 3 [IU] via SUBCUTANEOUS
  Administered 2019-12-02 (×3): 2 [IU] via SUBCUTANEOUS
  Administered 2019-12-03: 1 [IU] via SUBCUTANEOUS

## 2019-11-26 MED ORDER — PERFLUTREN LIPID MICROSPHERE
1.0000 mL | INTRAVENOUS | Status: AC | PRN
  Administered 2019-11-26: 2 mL via INTRAVENOUS
  Filled 2019-11-26: qty 10

## 2019-11-26 MED ORDER — ASPIRIN EC 81 MG PO TBEC
81.0000 mg | DELAYED_RELEASE_TABLET | Freq: Every day | ORAL | Status: DC
  Administered 2019-11-26 – 2019-12-03 (×8): 81 mg via ORAL
  Filled 2019-11-26 (×8): qty 1

## 2019-11-26 MED ORDER — FUROSEMIDE 10 MG/ML IJ SOLN
80.0000 mg | Freq: Two times a day (BID) | INTRAMUSCULAR | Status: DC
  Administered 2019-11-26 – 2019-11-27 (×3): 80 mg via INTRAVENOUS
  Filled 2019-11-26 (×3): qty 8

## 2019-11-26 MED ORDER — NITROGLYCERIN 0.4 MG SL SUBL
0.4000 mg | SUBLINGUAL_TABLET | SUBLINGUAL | Status: DC | PRN
  Administered 2019-11-26 – 2019-11-30 (×4): 0.4 mg via SUBLINGUAL
  Filled 2019-11-26 (×5): qty 1

## 2019-11-26 MED ORDER — ACETAMINOPHEN 325 MG PO TABS
650.0000 mg | ORAL_TABLET | Freq: Once | ORAL | Status: AC
  Administered 2019-11-26: 650 mg via ORAL
  Filled 2019-11-26: qty 2

## 2019-11-26 MED ORDER — SODIUM CHLORIDE 0.9% FLUSH
3.0000 mL | Freq: Once | INTRAVENOUS | Status: AC
  Administered 2019-11-26: 3 mL via INTRAVENOUS

## 2019-11-26 MED ORDER — HEPARIN (PORCINE) 25000 UT/250ML-% IV SOLN
1400.0000 [IU]/h | INTRAVENOUS | Status: DC
  Administered 2019-11-26: 1400 [IU]/h via INTRAVENOUS
  Filled 2019-11-26: qty 250

## 2019-11-26 MED ORDER — ENOXAPARIN SODIUM 150 MG/ML ~~LOC~~ SOLN
1.0000 mg/kg | SUBCUTANEOUS | Status: DC
  Filled 2019-11-26: qty 0.83

## 2019-11-26 MED ORDER — METOLAZONE 2.5 MG PO TABS
2.5000 mg | ORAL_TABLET | Freq: Once | ORAL | Status: AC
  Administered 2019-11-26: 2.5 mg via ORAL
  Filled 2019-11-26: qty 1

## 2019-11-26 MED ORDER — ISOSORBIDE DINITRATE 10 MG PO TABS
30.0000 mg | ORAL_TABLET | Freq: Every day | ORAL | Status: DC
  Administered 2019-11-26 – 2019-11-28 (×3): 30 mg via ORAL
  Filled 2019-11-26 (×2): qty 3
  Filled 2019-11-26: qty 1

## 2019-11-26 MED ORDER — TAMSULOSIN HCL 0.4 MG PO CAPS
0.4000 mg | ORAL_CAPSULE | Freq: Every day | ORAL | Status: DC
  Administered 2019-11-26 – 2019-12-03 (×8): 0.4 mg via ORAL
  Filled 2019-11-26 (×8): qty 1

## 2019-11-26 NOTE — Evaluation (Signed)
Physical Therapy Evaluation Patient Details Name: [name] [name] MRN: [id number] DOB: Jan 22, 1951 Today's Date: 11/26/2019   History of Present Illness  69 yo admitted to ED with CHF exacerbation and SOB. Pt noted to have bil LE blistering with drainage from knee to ankle. PMH includes CHF, CAD s/p CABG, aortic stenosis, s/p TAVR, Afib, HTN, obese, HLD, CKD, DM2, AICD.  Clinical Impression  Pt admitted secondary to problem above with deficits below. Pt with BLE blistering and reports of dizziness and wooziness following mobility and upon returning to supine. BP at 95/77 mmHg following mobility. Returned to 110/80 mmHg upon return to supine. Attempted to check for nystagmus upon return to supine, however, difficult to determine as pt unable to keep eyes open secondary to dizziness. Feel pt would benefit from vestibular eval during next session. Will continue to follow acutely to maximize functional mobility independence and safety.     Follow Up Recommendations Other (comment);Supervision for mobility/OOB(TBD pending vestibular evaluation;  may need outpatient PT)    Equipment Recommendations  None recommended by PT    Recommendations for Other Services       Precautions / Restrictions Precautions Precautions: Fall Restrictions Weight Bearing Restrictions: No      Mobility  Bed Mobility Overal bed mobility: Needs Assistance Bed Mobility: Supine to Sit;Sit to Supine     Supine to sit: Min assist Sit to supine: Supervision   General bed mobility comments: Min A for trunk elevation. Supervision for safety to return to supine. Pt reports dizziness upon return to supine and requesting to have HOB elevated. Attempted to look for nystagmus, however, pt having difficulty keeping eyes open and tended to roll eyes back. BP at 110/80 mmHg following return to supine.   Transfers Overall transfer level: Needs assistance Equipment used: None Transfers: Sit to/from Stand Sit to Stand: Min  guard;+2 safety/equipment         General transfer comment: min guard for safety.   Ambulation/Gait Ambulation/Gait assistance: Min guard Gait Distance (Feet): 10 Feet Assistive device: None   Gait velocity: Decreased   General Gait Details: Practiced forward and backward walking at EOB. Pt feeling some lightheadedness. BP at 95/77 mmHg following mobility.   Stairs            Wheelchair Mobility    Modified Rankin (Stroke Patients Only)       Balance Overall balance assessment: Needs assistance Sitting-balance support: No upper extremity supported;Feet supported Sitting balance-Leahy Scale: Good     Standing balance support: No upper extremity supported;During functional activity Standing balance-Leahy Scale: Fair                               Pertinent Vitals/Pain Pain Assessment: 0-10 Pain Score: 3  Pain Location: headache Pain Descriptors / Indicators: Headache Pain Intervention(s): Limited activity within patient's tolerance;Monitored during session;Repositioned    Home Living Family/patient expects to be discharged to:: Private residence Living Arrangements: Spouse/significant other Available Help at Discharge: Family Type of Home: House Home Access: Stairs to enter   Technical brewer of Steps: 1 Home Layout: One level Home Equipment: Environmental consultant - 2 wheels;Cane - single point;Crutches;Shower seat Additional Comments: sleeps in recliner at home     Prior Function Level of Independence: Independent               Hand Dominance   Dominant Hand: Right    Extremity/Trunk Assessment   Upper Extremity Assessment Upper Extremity Assessment: Defer to  OT evaluation    Lower Extremity Assessment Lower Extremity Assessment: RLE deficits/detail;LLE deficits/detail RLE Deficits / Details: blistering noted on lower part of shin to ankle (RLE>LLE) LLE Deficits / Details: blistering noted on lower part of shin to ankle (RLE>LLE)     Cervical / Trunk Assessment Cervical / Trunk Assessment: Normal  Communication   Communication: No difficulties  Cognition Arousal/Alertness: Awake/alert Behavior During Therapy: WFL for tasks assessed/performed Overall Cognitive Status: Within Functional Limits for tasks assessed                                        General Comments      Exercises     Assessment/Plan    PT Assessment Patient needs continued PT services  PT Problem List Decreased strength;Decreased balance;Decreased mobility;Decreased activity tolerance;Decreased skin integrity       PT Treatment Interventions Gait training;Stair training;Functional mobility training;Therapeutic activities;Therapeutic exercise;Balance training;Patient/family education    PT Goals (Current goals can be found in the Care Plan section)  Acute Rehab PT Goals Patient Stated Goal: to get swelling down PT Goal Formulation: With patient Time For Goal Achievement: 12/10/19 Potential to Achieve Goals: Good    Frequency Min 3X/week   Barriers to discharge        Co-evaluation PT/OT/SLP Co-Evaluation/Treatment: Yes Reason for Co-Treatment: To address functional/ADL transfers;For patient/therapist safety;Complexity of the patient's impairments (multi-system involvement) PT goals addressed during session: Mobility/safety with mobility;Balance OT goals addressed during session: ADL's and self-care;Strengthening/ROM       AM-PAC PT "6 Clicks" Mobility  Outcome Measure Help needed turning from your back to your side while in a flat bed without using bedrails?: None Help needed moving from lying on your back to sitting on the side of a flat bed without using bedrails?: A Little Help needed moving to and from a bed to a chair (including a wheelchair)?: A Little Help needed standing up from a chair using your arms (e.g., wheelchair or bedside chair)?: A Little Help needed to walk in hospital room?: A Little Help  needed climbing 3-5 steps with a railing? : A Little 6 Click Score: 19    End of Session Equipment Utilized During Treatment: Gait belt Activity Tolerance: Patient tolerated treatment well Patient left: in bed;with call bell/phone within reach Nurse Communication: Mobility status PT Visit Diagnosis: Other symptoms and signs involving the nervous system (R29.898)    Time: 1610-9604 PT Time Calculation (min) (ACUTE ONLY): 27 min   Charges:   PT Evaluation $PT Eval Moderate Complexity: 1 Mod          Reuel Derby, PT, DPT  Acute Rehabilitation Services  Pager: [phone] Office: [phone]   Rudean Hitt 11/26/2019, 1:30 PM

## 2019-11-26 NOTE — ED Triage Notes (Signed)
Patient reports central chest pain this evening radiating to left arm with tingling , mild SOB , no emesis or diaphoresis .

## 2019-11-26 NOTE — Consult Note (Signed)
WOC Nurse Consult Note: Reason for Consult: Bilateral lower extremity edema, erythema and weeping. Several partial thickness wounds and one full thickness wound in the anterior LE/pretibial area. Wound type: venous insufficiency Pressure Injury POA: N/A Measurement: scattered partial thickness wound in the anterior lower extremities, the largest measures 0.2cm round x 0.1cm. One full thickness wound on the anterior left LE measuring 0.6cm x 0.4cm x 0.2cm. Wound RFX:JOIT, moist Drainage (amount, consistency, odor) serous, moderate amount.   Periwound: edematous (patient reports that the edema has subsided significantly since being elevated over night), erythematous Dressing procedure/placement/frequency: Patient requires compression and in the absence of recent ABIs on the medical record I will not Order bilateral Unna's Boots. Instead, I will provide Nursing with guidance for wound that that will provide compression that is, xeroform gauze to the open and draining areas, followed by Kerlix roll gauze applied from just below toes to just below knees topped with 6-inch ACE bandages and changed twice daily.  After obtaining ABIs and if you agree, consider order for Ortho Tech to place bilateral Unna's boots with twice weekly changes.  [facility] nursing team will not follow, but will remain available to this patient, the nursing and medical teams.  Please re-consult if needed. Thanks, Maudie Flakes, MSN, RN, [place], Ar[facility]  Pager# [phone]

## 2019-11-26 NOTE — Evaluation (Signed)
Occupational Therapy Evaluation Patient Details Name: [name] [name] MRN: [id number] DOB: [birth year] Today's Date: 11/26/2019    History of Present Illness 69 yo admitted to ED with CHF exacerbation and SOB. Pt noted to have bil LE blistering with drainage from knee to ankle. PMH includes CHF, CAD s/p CABG, aortic stenosis, s/p TAVR, Afib, HTN, obese, HLD, CKD, DM2, AICD.   Clinical Impression   PT admitted with CHF exacerbation. Pt currently with functional limitiations due to the deficits listed below (see OT problem list). Pt currently demonstrates vestibular deficits and decrease endurance.Pt could benefit from energy conservation for return home.  Pt will benefit from skilled OT to increase their independence and safety with adls and balance to allow discharge home.     Follow Up Recommendations  No OT follow up    Equipment Recommendations  None recommended by OT    Recommendations for Other Services       Precautions / Restrictions Precautions Precautions: Fall Restrictions Weight Bearing Restrictions: No      Mobility Bed Mobility Overal bed mobility: Needs Assistance Bed Mobility: Supine to Sit;Sit to Supine     Supine to sit: Min assist Sit to supine: Supervision   General bed mobility comments: Min A for trunk elevation. Supervision for safety to return to supine. Pt reports dizziness upon return to supine and requesting to have HOB elevated. Attempted to look for nystagmus, however, pt having difficulty keeping eyes open and tended to roll eyes back. BP at 110/80 mmHg following return to supine.   Transfers Overall transfer level: Needs assistance Equipment used: None Transfers: Sit to/from Stand Sit to Stand: Min guard;+2 safety/equipment         General transfer comment: min guard for safety.     Balance Overall balance assessment: Needs assistance Sitting-balance support: No upper extremity supported;Feet supported Sitting balance-Leahy Scale:  Good     Standing balance support: No upper extremity supported;During functional activity Standing balance-Leahy Scale: Fair                             ADL either performed or assessed with clinical judgement   ADL Overall ADL's : Needs assistance/impaired Eating/Feeding: Independent   Grooming: Supervision/safety               Lower Body Dressing: Min guard;Sit to/from stand Lower Body Dressing Details (indicate cue type and reason): pt is able to complete figure 4 to reach bil LE. pt noted ot have drainage on bil calf area Toilet Transfer: Min guard             General ADL Comments: pt reports inability to lay flat and progressive worsening. pt demonstrates signs of BPPV when encouraged to lay down and have HOB elevated     Vision Baseline Vision/History: No visual deficits Additional Comments: noted to have nystagmus but difficult to fully assess as pt is rolling eyes upward in response to dislike of reposition     Perception     Praxis      Pertinent Vitals/Pain Pain Assessment: 0-10 Pain Score: 3  Pain Location: headache Pain Descriptors / Indicators: Headache Pain Intervention(s): Limited activity within patient's tolerance     Hand Dominance Right   Extremity/Trunk Assessment Upper Extremity Assessment Upper Extremity Assessment: RUE deficits/detail;LUE deficits/detail RUE Deficits / Details: reports torn rotator cuff but able to reach 100 degrees and with swinging arm able to progress arm over head to 160 degrees and sustain  against wall. pt is able to reach in abduction internal rotation LUE Deficits / Details: 90 degrees AROM and reports torn rotator cuff   Lower Extremity Assessment Lower Extremity Assessment: Defer to PT evaluation RLE Deficits / Details: blistering noted on lower part of shin to ankle (RLE>LLE) LLE Deficits / Details: blistering noted on lower part of shin to ankle (RLE>LLE)   Cervical / Trunk  Assessment Cervical / Trunk Assessment: Normal   Communication Communication Communication: No difficulties   Cognition Arousal/Alertness: Awake/alert Behavior During Therapy: WFL for tasks assessed/performed Overall Cognitive Status: Within Functional Limits for tasks assessed                                     General Comments  pt self reports hx of orthostatic BP. pt noted to have bp 110/80 then after activity and supine 95/77. pt reports HA and sinus pressure with position changes.     Exercises     Shoulder Instructions      Home Living Family/patient expects to be discharged to:: Private residence Living Arrangements: Spouse/significant other Available Help at Discharge: Family Type of Home: House Home Access: Stairs to enter Technical brewer of Steps: 1   Home Layout: One level     Bathroom Shower/Tub: Teacher, early years/pre: Handicapped height     Home Equipment: Environmental consultant - 2 wheels;Cane - single point;Crutches;Shower seat   Additional Comments: sleeps in recliner at home       Prior Functioning/Environment Level of Independence: Independent                 OT Problem List: Decreased strength;Decreased activity tolerance;Impaired balance (sitting and/or standing);Cardiopulmonary status limiting activity;Obesity;Impaired UE functional use;Pain      OT Treatment/Interventions: Self-care/ADL training;Therapeutic exercise;Energy conservation;DME and/or AE instruction;Manual therapy;Therapeutic activities;Patient/family education;Balance training    OT Goals(Current goals can be found in the care plan section) Acute Rehab OT Goals Patient Stated Goal: to get swelling down OT Goal Formulation: With patient Time For Goal Achievement: 12/10/19 Potential to Achieve Goals: Good  OT Frequency: Min 2X/week   Barriers to D/C:            Co-evaluation PT/OT/SLP Co-Evaluation/Treatment: Yes Reason for Co-Treatment: For  patient/therapist safety;To address functional/ADL transfers PT goals addressed during session: Mobility/safety with mobility;Balance OT goals addressed during session: ADL's and self-care;Strengthening/ROM      AM-PAC OT "6 Clicks" Daily Activity     Outcome Measure Help from another person eating meals?: None Help from another person taking care of personal grooming?: A Little Help from another person toileting, which includes using toliet, bedpan, or urinal?: A Little Help from another person bathing (including washing, rinsing, drying)?: A Little Help from another person to put on and taking off regular upper body clothing?: None Help from another person to put on and taking off regular lower body clothing?: A Little 6 Click Score: 20   End of Session Nurse Communication: Mobility status;Precautions  Activity Tolerance: Patient tolerated treatment well Patient left: in bed;with call bell/phone within reach  OT Visit Diagnosis: Unsteadiness on feet (R26.81);Muscle weakness (generalized) (M62.81)                Time: 3846-6599 OT Time Calculation (min): 27 min Charges:  OT General Charges $OT Visit: 1 Visit OT Evaluation $OT Eval Moderate Complexity: 1 Mod   Brynn, OTR/L  Acute Rehabilitation Services Pager: [phone] Office: [phone] .  Jeri Modena 11/26/2019, 3:52 PM

## 2019-11-26 NOTE — Progress Notes (Signed)
ANTICOAGULATION CONSULT NOTE - Initial Consult  Pharmacy Consult for lovenox Indication: atrial fibrillation  Allergies  Allergen Reactions  . Dyflex-G [Dyphylline-Guaifenesin] Hives  . Eliquis [Apixaban] Other (See Comments)    Side effects not a true allergy- he stated that it made him feel bad    Patient Measurements: Height: 5\' 10"  (177.8 cm) Weight: 275 lb 9.2 oz (125 kg) IBW/kg (Calculated) : 73  Vital Signs: Temp: 97.5 F (36.4 C) (03/31 0106) Temp Source: Oral (03/31 0106) BP: 107/75 (03/31 1000) Pulse Rate: 113 (03/31 1015)  Labs: Recent Labs    11/26/19 0120 11/26/19 0120 11/26/19 0330 11/26/19 0621 11/26/19 0808  HGB 13.4  --   --   --   --   HCT 44.2  --   --   --   --   PLT 312  --   --   --   --   LABPROT 22.5*  --   --   --   --   INR 2.0*  --   --   --   --   CREATININE 3.15*  --   --   --   --   TROPONINIHS 22*   < > 21* 18* 16   < > = values in this interval not displayed.    Estimated Creatinine Clearance: 29.4 mL/min (A) (by C-G formula based on SCr of 3.15 mg/dL (H)).   Medical History: Past Medical History:  Diagnosis Date  . AICD (automatic cardioverter/defibrillator) present 05/18/2016  . Anginal pain ([place])    occ none recent  . Aortic stenosis   . Arthritis   . CAD (coronary artery disease)   . Cardiomyopathy, ischemic   . CHF (congestive heart failure) ([place])   . Chronic back pain   . Diabetes mellitus without complication ([place])   . Elevated PSA   . History of kidney stones   . Hyperlipidemia   . Morbid obesity ([place])   . Myocardial infarction ([place])   . OSA (obstructive sleep apnea)    does not wear CPAP, -order run out told need to redo  test  . Paroxysmal atrial fibrillation ([place]) 09/29/2018   This was found on event monitor October 2019  . 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 Found to have an AKI. He is on chronic xarelto for afib with his last dose being last night. To transition to lovenox temporarily. CBC is WNL. No bleeding noted. Of note, pt did receive a dose of heparin SQ this morning.   Goal of Therapy:  Anti-Xa level 0.6-1 units/ml 4hrs after LMWH dose given Monitor platelets by anticoagulation protocol: Yes   Plan:  Lovenox 125mg  SQ Q24H starting tonight CBC Q72H  Donnabelle Blanchard, Rande Lawman 11/26/2019,11:31 AM

## 2019-11-26 NOTE — ED Notes (Signed)
Lunch Tray Ordered @ 1054. 

## 2019-11-26 NOTE — ED Notes (Signed)
Patient states he does not want any more Nitro at this time.

## 2019-11-26 NOTE — Consult Note (Signed)
[facility] Consult Note   Primary Physician:  Kathyrn Drown, MD      Primary Cardiologist:   Johnsie Cancel / Will Meredith Leeds, MD       Reason for Consultation:  Frequent chest pain  HPI:    [name] [name] is a 68 year old gentleman with a past medical history significant for ischemic cardiomyopathy (EF 15%), CAD s/p CABG x6 in 2001, subsequent PCI's, aortic stenosis s/p TAVR (March 2019), left bundle branch block on ECG, paroxysmal atrial fibrillation, Medtronic single-chamber ICD implant 05/18/2016, morbid obesity, obstructive sleep apnea (not on CPAP), hypertension, hyperlipidemia, chronic kidney disease and type 2 diabetes mellitus, who presents to the hospital with complaints of frequent episodic chest pain.  Patient has been taking increasing doses of nitroglycerin at home.  Lately this has not helped with the chest pain.  He denies any nausea or vomiting.  Recently his torsemide was uptitrated to 20 mg 3 tablets twice daily.  Despite this he has been noticing increasing swelling in his legs.  He sleeps in a recliner every night.  For the past few nights he has had difficulty sleeping.  His symptoms have progressively worsened over the past 3 weeks.  He is also being treated for a dental abscess.  His appetite overall has been reduced.  He has been eating soft foods such as mashed potatoes and mac & cheese from [employer].  He also has weeping sores on both legs.  The patient had a 6 vessel CABG 12/23/1999 - LIMA to LAD, SVG to ramus, sequential SVG to OM2-RPL, sequential SVG to AM-PDA.  He is also s/p TAVR on 11/20/2017 for aortic stenosis with a 29 mm Edwards SAPIEN 3 valve.  Apparently he developed a LBBB subsequent to that.  He also has a history of paroxysmal atrial fibrillation with a CHA2DS2-VASc score of 4.  He is currently on Eliquis.  He also had a hospitalization in the setting of Covid pneumonia when he was documented to have ventricular tachycardia.  At that time he was  significantly hypokalemic.  In the ED the electrocardiogram revealed sinus tachycardia, and a left bundle branch block morphology.  High-sensitivity troponins were 22 and 21.  Blood pressure was 106/76 mmHg with a heart rate of 87 bpm.  His other labs were as follows: Potassium 4.7, BUN 53, creatinine 3.15, WBC 7.2, hemoglobin 13.4, hematocrit 44.2, and platelets 312.  The INR was 2.0.  The chest x-ray did not reveal any pulmonary edema.  There were small bilateral pleural effusions.    Previous cardiac testing TTE 03/04/19 1. The left ventricle has a visually estimated ejection fraction of 15-20%. The cavity size was moderately dilated. Left ventricular diastolic Doppler parameters are indeterminate. 2. The right ventricle has moderately reduced systolic function. The cavity was moderately enlarged. There is no increase in right ventricular wall thickness. Right ventricular systolic pressure 16.0 mmHg. 3. Left atrial size was severely dilated. 4. Right atrial size was mildly dilated. 5. There is mild mitral annular calcification present. 6. A 17m Edwards Sapien bioprosthetic aortic valve (TAVR) valve is present in the aortic position. Procedure Date: 11/20/2017. Leaflet excursion appears normal. No prosthetic or paravalvular regurgitation. Mean systolic gradient 8 mmHg. 7. The aortic root and ascending aorta are normal in size and structure. 8. The inferior vena cava was dilated in size with <50% respiratory variability. 9. No intracardiac thrombi or masses were visualized with Definity contrast. 10. When compared to the prior study: 01/04/2018 - no significant change has  occured. Aortic valve systolic Doppler gradient better visualized on today's exam.  09/2017 cath 1.  Severe three-vessel coronary artery disease with total occlusion of the RCA, total occlusion of the left circumflex, and total occlusion of the LAD just after the first diagonal 2. Status post aortocoronary bypass surgery  with patent grafts including LIMA to LAD, saphenous vein graft to ramus intermedius, saphenous vein graft to second obtuse marginal, and saphenous vein graft to right PDA 3. Calcified, restricted aortic valve under plain fluoroscopy, with mean transvalvular gradient 27 mmHg    Home Medications Prior to Admission medications   Medication Sig Start Date End Date Taking? Authorizing Provider  acetaminophen (TYLENOL) 500 MG tablet Take 1 tablet (500 mg total) by mouth 2 (two) times daily. 10/29/18   Kathyrn Drown, MD  blood glucose meter kit and supplies KIT Dispense based on patient and insurance preference. Test  up to four times daily as directed.Dx: E11.9 06/30/19   Kathyrn Drown, MD  calcium carbonate (TUMS - DOSED IN MG ELEMENTAL CALCIUM) 500 MG chewable tablet Chew 2 tablets by mouth 2 (two) times daily as needed for indigestion or heartburn.     [provider]  carvedilol (COREG) 3.125 MG tablet Take 1 tablet (3.125 mg total) by mouth 2 (two) times daily with a meal. 11/12/19   Luking, Elayne Snare, MD  FIBER PO Take 5 capsules by mouth daily.    [provider]  furosemide (LASIX) 40 MG tablet TAKE 2 TABLETS BY MOUTH TWICE DAILY. 11/03/19   Arnoldo Lenis, MD  HYDROcodone-acetaminophen [facility]) 10-325 MG tablet Take one tablet po QID prn pain 10/24/19   Kathyrn Drown, MD  insulin aspart (NOVOLOG FLEXPEN) 100 UNIT/ML FlexPen Inject 3 Units into the skin 3 (three) times daily with meals. Patient taking differently: Inject 20 Units into the skin 3 (three) times daily with meals.  08/28/19   Eugenie Filler, MD  Insulin Detemir (LEVEMIR FLEXTOUCH) 100 UNIT/ML Pen Inject 40 Units into the skin at bedtime. 08/28/19   Eugenie Filler, MD  isosorbide dinitrate (ISORDIL) 30 MG tablet Take 1 tablet (30 mg total) by mouth daily. 08/28/19   Eugenie Filler, MD  linaclotide (LINZESS) 290 MCG CAPS capsule TAKE (1) CAPSULE BY MOUTH EACH MORNING BEFORE BREAKFAST. Patient taking  differently: Take 290 mcg by mouth daily before breakfast.  09/01/19   Kathyrn Drown, MD  Menthol-Methyl Salicylate (GOODSENSE MUSCLE RUB) 8-30 % CREA Apply 1 application topically daily as needed (muscle pain).    [provider]  metolazone (ZAROXOLYN) 2.5 MG tablet TAKE ONE TABLET BY MOUTH DAILY. 11/19/19   Arnoldo Lenis, MD  Multiple Vitamins-Minerals (CENTRUM SILVER PO) Take 1 tablet by mouth daily.     [provider]  nitroGLYCERIN (NITROSTAT) 0.4 MG SL tablet PLACE 1 TABLET UNDER TONGUE FOR CHEST PAIN. MAY REPEAT EVERY 5 MIN UP TO 3 DOSES-NO RELIEF,CALL 911. 11/03/19   Sherren Mocha, MD  nystatin (MYCOSTATIN) 100000 UNIT/ML suspension Take one tsp (39m) qid. Swish and swallow. Take for 10 days 09/20/19   LKathyrn Drown MD  pantoprazole (PROTONIX) 40 MG tablet Take 1 tablet (40 mg total) by mouth 2 (two) times daily. 09/01/19 08/31/20  LKathyrn Drown MD  potassium chloride SA (KLOR-CON M20) 20 MEQ tablet Take 2 tablets (40 mEq total) by mouth 2 (two) times daily. 08/28/19   TEugenie Filler MD  rivaroxaban (XARELTO) 20 MG TABS tablet Take 1 tablet (20 mg total) by mouth  daily with supper. 05/23/19   Arnoldo Lenis, MD  rosuvastatin (CRESTOR) 40 MG tablet TAKE ONE TABLET BY MOUTH DAILY. Patient taking differently: Take 40 mg by mouth daily.  07/18/19   Kathyrn Drown, MD  sucralfate (CARAFATE) 1 g tablet Take 1 tablet (1 g total) by mouth 4 (four) times daily. 09/18/19   Kathyrn Drown, MD  SURE COMFORT PEN NEEDLES 31G X 8 MM MISC USING 4 TIMES DAILY. 04/07/19   Kathyrn Drown, MD  tamsulosin (FLOMAX) 0.4 MG CAPS capsule Take 1 capsule (0.4 mg total) by mouth daily. 09/01/19   Kathyrn Drown, MD  torsemide (DEMADEX) 20 MG tablet Take 3 tablets po in the morning and 3 tablets po at 2 p.m. 10/31/19   Kathyrn Drown, MD  traMADol (ULTRAM) 50 MG tablet TAKE 1 TABLET BY MOUTH EVERY 6 TO 8 HOURS AS NEEDED FOR HEADACHE. Patient taking differently: Take 50 mg by mouth every 6  (six) hours as needed (Headache).  10/14/19   Kathyrn Drown, MD  trolamine salicylate (ASPERCREME) 10 % cream Apply 1 application topically 2 (two) times daily as needed for muscle pain.     [provider]    Past Medical History: Past Medical History:  Diagnosis Date  . AICD (automatic cardioverter/defibrillator) present 05/18/2016  . Anginal pain ([place])    occ none recent  . Aortic stenosis   . Arthritis   . CAD (coronary artery disease)   . Cardiomyopathy, ischemic   . CHF (congestive heart failure) ([place])   . Chronic back pain   . Diabetes mellitus without complication ([place])   . Elevated PSA   . History of kidney stones   . Hyperlipidemia   . Morbid obesity ([place])   . Myocardial infarction ([place])   . OSA (obstructive sleep apnea)    does not wear CPAP, -order run out told need to redo  test  . Paroxysmal atrial fibrillation ([place]) 09/29/2018   This was found on event monitor October 2019  . 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   29 mm Edwards Sapien 3 transcatheter heart valve placed via percutaneous right transfemoral approach     Past Surgical History: Past Surgical History:  Procedure Laterality Date  . APPENDECTOMY    . BIOPSY  10/02/2018   Procedure: BIOPSY;  Surgeon: Daneil Dolin, MD;  [facility];  Service: Endoscopy;;  gastric  . CARDIAC CATHETERIZATION     stents  . CAROTID STENT     denies carotid stent  . CATARACT EXTRACTION, BILATERAL    . CORONARY ARTERY BYPASS GRAFT  12/23/1999   LIMA to LAD, SVG to ramus, sequential SVG to OM2-RPL, sequential SVG to AM-PDA  . EP IMPLANTABLE DEVICE N/A 05/18/2016   Procedure: ICD Implant;  Surgeon: Will Meredith Leeds, MD;  Location: [facility];  Service: Cardiovascular;  Laterality: N/A;  . ESOPHAGOGASTRODUODENOSCOPY (EGD) WITH PROPOFOL N/A 10/02/2018   Procedure:  ESOPHAGOGASTRODUODENOSCOPY (EGD) WITH PROPOFOL;  Surgeon: Daneil Dolin, MD; Mild Schatzki ring, small hiatal hernia, nonbleeding cratered gastric ulcer with no stigmata of bleeding, erosive gastropathy s/p biopsy, normal duodenum.  Findings suspicious for occult NSAID use.  Pathology with reactive gastropathy, no H. pylori.  . EYE SURGERY    . ingrown toenail N/A   . LEFT HEART CATHETERIZATION WITH CORONARY/GRAFT ANGIOGRAM N/A 12/21/2014  Procedure: LEFT HEART CATHETERIZATION WITH Beatrix Fetters;  Surgeon: Belva Crome, MD;  Location: [facility] C[facility] LAB;  Service: Cardiovascular;  Laterality: N/A;  . NASAL SEPTOPLASTY W/ TURBINOPLASTY Bilateral 04/25/2017   NASAL SEPTOPLASTY WITH TURBINATE REDUCTION/notes 04/25/2017  . NASAL SEPTOPLASTY W/ TURBINOPLASTY Bilateral 04/25/2017   Procedure: NASAL SEPTOPLASTY WITH TURBINATE REDUCTION;  Surgeon: Leta Baptist, MD;  Location: [place];  Service: ENT;  Laterality: Bilateral;  . RIGHT/LEFT HEART CATH AND CORONARY/GRAFT ANGIOGRAPHY N/A 10/05/2017   Procedure: RIGHT/LEFT HEART CATH AND CORONARY/GRAFT ANGIOGRAPHY;  Surgeon: Sherren Mocha, MD;  Location: [place] [facility];  Service: Cardiovascular;  Laterality: N/A;  . TEE WITHOUT CARDIOVERSION N/A 11/20/2017   Procedure: TRANSESOPHAGEAL ECHOCARDIOGRAM (TEE);  Surgeon: Sherren Mocha, MD;  Location: [place];  Service: Open Heart Surgery;  Laterality: N/A;  . TONSILLECTOMY    . TRANSCATHETER AORTIC VALVE REPLACEMENT, TRANSFEMORAL N/A 11/20/2017   Procedure: TRANSCATHETER AORTIC VALVE REPLACEMENT, TRANSFEMORAL;  Surgeon: Sherren Mocha, MD;  Location: [place];  Service: Open Heart Surgery;  Laterality: N/A;    Family History: Family History  Adopted: Yes  Problem Relation Age of Onset  . Diabetes Mother   . CAD Mother   . Colon cancer Neg Hx   . Colon polyps Neg Hx     Social History: Social History   Socioeconomic History  . Marital status: Married    Spouse name: Not on file  . Number of children: Not  on file  . Years of education: Not on file  . Highest education level: Not on file  Occupational History  . Occupation: retired    Comment: [employer]    Comment: [employer]  Tobacco Use  . Smoking status: Former Smoker    Quit date: 08/29/1999    Years since quitting: 20.2  . Smokeless tobacco: Never Used  . Tobacco comment: couldn't remember date  Substance and Sexual Activity  . Alcohol use: No    Alcohol/week: 0.0 standard drinks  . Drug use: No  . Sexual activity: Not Currently  Other Topics Concern  . Not on file  Social History Narrative  . Not on file   Social Determinants of Health   Financial Resource Strain:   . Difficulty of Paying Living Expenses:   Food Insecurity:   . Worried About Charity fundraiser in the Last Year:   . Arboriculturist in the Last Year:   Transportation Needs:   . Film/video editor (Medical):   Marland Kitchen Lack of Transportation (Non-Medical):   Physical Activity:   . Days of Exercise per Week:   . Minutes of Exercise per Session:   Stress:   . Feeling of Stress :   Social Connections:   . Frequency of Communication with Friends and Family:   . Frequency of Social Gatherings with Friends and Family:   . Attends Religious Services:   . Active Member of Clubs or Organizations:   . Attends Archivist Meetings:   Marland Kitchen Marital Status:     Allergies:  Allergies  Allergen Reactions  . Dyflex-G [Dyphylline-Guaifenesin] Hives  . Eliquis [Apixaban] Other (See Comments)    Side effects not a true allergy- he stated that it made him feel bad     Review of Systems: [y] = yes, _0  = no   . General: Weight gain _1 ; Weight loss _2 ; Anorexia _3 ; Fatigue _4 ; Fever _5 ; Chills _6 ; Weakness _7   . Cardiac: Chest pain/pressure [Y]; Resting  SOB _0 ; Exertional SOB _1 ; Orthopnea _2 ; Pedal Edema _3 ; Palpitations _4 ; Syncope _5 ; Presyncope _6 ; Paroxysmal nocturnal dyspnea_7   . Pulmonary: Cough _8 ; Wheezing_9 ; Hemoptysis_10 ;  Sputum _11 ; Snoring _12   . GI: Vomiting_13 ; Dysphagia_14 ; Melena_15 ; Hematochezia _16 ; Heartburn_17 ; Abdominal pain _18 ; Constipation _19 ; Diarrhea _20 ; BRBPR _21   . GU: Hematuria_22 ; Dysuria _23 ; Nocturia_24   . Vascular: Pain in legs with walking _25 ; Pain in feet with lying flat _26 ; Non-healing sores _27 ; Stroke _28 ; TIA _29 ; Slurred speech _30 ;  . Neuro: Headaches_31 ; Vertigo_32 ; Seizures_33 ; Paresthesias_34 ;Blurred vision _35 ; Diplopia _36 ; Vision changes _37   . Ortho/Skin: Arthritis _38 ; Joint pain _39 ; Muscle pain _40 ; Joint swelling _41 ; Back Pain _42 ; Rash _43   . Psych: Depression_44 ; Anxiety_45   . Heme: Bleeding problems _46 ; Clotting disorders _47 ; Anemia _48   . Endocrine: Diabetes _49 ; Thyroid dysfunction_50      Objective:    Vital Signs:   Temp:  [97.5 F (36.4 C)] 97.5 F (36.4 C) (03/31 0106) Pulse Rate:  [87-122] 87 (03/31 0447) Resp:  [13-18] 13 (03/31 0447) BP: (106-147)/(76-128) 147/128 (03/31 0447) SpO2:  [95 %-100 %] 95 % (03/31 0447) Weight:  [125 kg] 125 kg (03/31 0112)    Weight change: Filed Weights   11/26/19 0112  Weight: 125 kg    Intake/Output:  No intake or output data in the 24 hours ending 11/26/19 0456    Physical Exam    General: Appears mildly uncomfortable HEENT: normal Neck: supple.  Unable to assess JVP due to body habitus. Carotids 2+ bilat; no bruits. No lymphadenopathy or thyromegaly appreciated. Cor: Distant heart sounds, tachycardic, regular Lungs: clear to auscultation bilaterally.  Decreased breath sounds at the bases Abdomen: Distended, soft, nontender, No hepatosplenomegaly. No bruits or masses. Extremities: no cyanosis, clubbing, rash, nonpitting edema bilateral lower extremities Neuro: alert & orientedx3, cranial nerves grossly intact. moves all 4 extremities w/o difficulty.  Affect pleasant     Labs   Basic Metabolic Panel: Recent Labs  Lab 11/26/19 0120  NA 133*  K 4.7  CL 81*  CO2 42*  GLUCOSE 149*  BUN  53*  CREATININE 3.15*  CALCIUM 8.7*    Liver Function Tests: No results for input(s): AST, ALT, ALKPHOS, BILITOT, PROT, ALBUMIN in the last 168 hours. No results for input(s): LIPASE, AMYLASE in the last 168 hours. No results for input(s): AMMONIA in the last 168 hours.  CBC: Recent Labs  Lab 11/26/19 0120  WBC 7.2  HGB 13.4  HCT 44.2  MCV 99.8  PLT 312    Cardiac Enzymes: No results for input(s): CKTOTAL, CKMB, CKMBINDEX, TROPONINI in the last 168 hours.  BNP: BNP (last 3 results) No results for input(s): BNP in the last 8760 hours.  ProBNP (last 3 results) No results for input(s): PROBNP in the last 8760 hours.   CBG: No results for input(s): GLUCAP in the last 168 hours.  Coagulation Studies: Recent Labs    11/26/19 0120  LABPROT 22.5*  INR 2.0*     Imaging   DG Chest 2 View  Result Date: 11/26/2019 CLINICAL DATA:  Chest pain and shortness of breath EXAM: CHEST - 2 VIEW COMPARISON:  08/25/2019 FINDINGS: Small pleural effusions. Moderate cardiomegaly. Aortic valve replacement. Unchanged position of left chest  wall AICD single lead. No focal airspace consolidation or pulmonary edema. IMPRESSION: Cardiomegaly and small bilateral pleural effusions. Electronically Signed   By: Ulyses Jarred M.D.   On: 11/26/2019 01:58       Assessment/Plan   1.  Intermittent chest pain The patient has known coronary artery disease and a previous CABG. On this admission cardiac biomarkers are negative.  The ECG has a left bundle branch block morphology.  The patient has been complaining of intermittent chest pain.  -Trend cardiac biomarkers -Serial ECGs -Aspirin 81 mg daily -Continue Imdur -Continue rosuvastatin 40 mg nightly  2. Acute on chronic systolic heart failure The patient has noticed increasing swelling in his legs despite the dose of the torsemide being increased by his primary care physician.  He is now sleeping in a recliner and experiences chest tightness  almost every night.  -Daily weights -Strict I and Os -Continue carvedilol 3.125 mg twice daily -Continue long-acting nitrate -Torsemide 20 mg 3 tablets twice daily - hold due to AKI -Metolazone  - hold due to AKI -Spironolactone 25 mg daily - hold due to AKI -Compression wraps for bilateral leg swelling -consider wound consult -Heart failure consult in AM  Patient will likely need an IV infusion of furosemide +/- inotropes for his decompensated heart failure.  3.  Paroxysmal atrial fibrillation CHA2DS2-VASc score of 4  -Continue Eliquis  4.  Acute kidney injury Will defer to medicine service for management of this problem.   Meade Maw, MD  11/26/2019, 4:56 AM  Cardiology Overnight Team Please contact [facility] for night-coverage after hours (4p -7a ) and weekends on [email or web address]

## 2019-11-26 NOTE — H&P (Signed)
[facility] History and Physical  [name] [name] HEN:[id number] DOB: 16-Apr-1951 DOA: 11/26/2019  Referring EDP: Mesner, MD PCP: Kathyrn Drown, MD   Chief Complaint: Chest Pain  HPI: [name] [name] is a 69 y.o. male with PMH Ischemic Cardiomyopathy, HFrEF (EF 15%), CAD s/p CABG, Aortic stenosis s/p TAVR, paroxysmal A fib, HTN, HLD, obesity, T2DM, CKD who presented to ED with chest pain and SOB and found to have AKI and acute CHF.  Patient and his wife provide history. Patient reports 2 weeks worsening chest pain and progressively using more Nitro which has not been helping. Reports worsening SOB mainly at night and with exertion. Now sleeping in recliner. Some nausea and constipation. Wife reports that his abdomen seems bloated. Reports increasing swelling in his legs. He has been compliant with his medications. He is also being treated for a dental abscess and has been eating soft foods such as mashed potatoes and mac & cheese from [employer]. Per wife, decreased appetite overall. Also has weeping sores on both legs which they wrap with tape each evening. Denies headache, dizziness, fever, chills, cough, vomiting, diarrhea, dysuria, hematuria, hematochezia, melena, difficulty moving arms/legs, speech difficulty, confusion or any other complaints.  In the ED: Tachycardic and borderline hypotensive otherwise vitals stable. Trop 22 and 21. Labs remarkable for: Potassium 4.7, BUN 53, creatinine 3.15, WBC 7.2, hemoglobin 13.4, hematocrit 44.2, and platelets 312.  The INR was 2.0.  The chest x-ray did not reveal any pulmonary edema. There were small bilateral pleural effusions. Cardiology consulted and will follow. Recommend HF consult. Requested admission for management of AKI.  Review of Systems:  All other systems negative unless noted above in HPI.   Past Medical History:  Diagnosis Date  . AICD (automatic cardioverter/defibrillator) present 05/18/2016  . Anginal pain ([place])    occ none  recent  . Aortic stenosis   . Arthritis   . CAD (coronary artery disease)   . Cardiomyopathy, ischemic   . CHF (congestive heart failure) ([place])   . Chronic back pain   . Diabetes mellitus without complication ([place])   . Elevated PSA   . History of kidney stones   . Hyperlipidemia   . Morbid obesity ([place])   . Myocardial infarction ([place])   . OSA (obstructive sleep apnea)    does not wear CPAP, -order run out told need to redo  test  . Paroxysmal atrial fibrillation ([place]) 09/29/2018   This was found on event monitor October 2019  . 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 APPENDECTOMY    . BIOPSY  10/02/2018   Procedure: BIOPSY;  Surgeon: Daneil Dolin, MD;  [facility];  Service: Endoscopy;;  gastric  . CARDIAC CATHETERIZATION     stents  . CAROTID STENT     denies carotid stent  . CATARACT EXTRACTION, BILATERAL    . CORONARY ARTERY BYPASS GRAFT  12/23/1999   LIMA to LAD, SVG to ramus, sequential SVG to OM2-RPL, sequential SVG to AM-PDA  . EP IMPLANTABLE DEVICE N/A 05/18/2016   Procedure: ICD Implant;  Surgeon: Will Meredith Leeds, MD;  Location: [place] CV LAB;  Service: Cardiovascular;  Laterality: N/A;  . ESOPHAGOGASTRODUODENOSCOPY (EGD) WITH PROPOFOL  N/A 10/02/2018   Procedure: ESOPHAGOGASTRODUODENOSCOPY (EGD) WITH PROPOFOL;  Surgeon: Daneil Dolin, MD; Mild Schatzki ring, small hiatal hernia, nonbleeding cratered gastric ulcer with no stigmata of bleeding, erosive gastropathy s/p biopsy, normal duodenum.  Findings suspicious for occult NSAID use.  Pathology with reactive gastropathy, no H. pylori.  . EYE SURGERY    . ingrown toenail N/A   . LEFT HEART CATHETERIZATION WITH  CORONARY/GRAFT ANGIOGRAM N/A 12/21/2014   Procedure: LEFT HEART CATHETERIZATION WITH Beatrix Fetters;  Surgeon: Belva Crome, MD;  Location: [facility] CATH LAB;  Service: Cardiovascular;  Laterality: N/A;  . NASAL SEPTOPLASTY W/ TURBINOPLASTY Bilateral 04/25/2017   NASAL SEPTOPLASTY WITH TURBINATE REDUCTION/notes 04/25/2017  . NASAL SEPTOPLASTY W/ TURBINOPLASTY Bilateral 04/25/2017   Procedure: NASAL SEPTOPLASTY WITH TURBINATE REDUCTION;  Surgeon: Leta Baptist, MD;  Location: [place];  Service: ENT;  Laterality: Bilateral;  . RIGHT/LEFT HEART CATH AND CORONARY/GRAFT ANGIOGRAPHY N/A 10/05/2017   Procedure: RIGHT/LEFT HEART CATH AND CORONARY/GRAFT ANGIOGRAPHY;  Surgeon: Sherren Mocha, MD;  Location: [place] CV LAB;  Service: Cardiovascular;  Laterality: N/A;  . TEE WITHOUT CARDIOVERSION N/A 11/20/2017   Procedure: TRANSESOPHAGEAL ECHOCARDIOGRAM (TEE);  Surgeon: Sherren Mocha, MD;  Location: [place];  Service: Open Heart Surgery;  Laterality: N/A;  . TONSILLECTOMY    . TRANSCATHETER AORTIC VALVE REPLACEMENT, TRANSFEMORAL N/A 11/20/2017   Procedure: TRANSCATHETER AORTIC VALVE REPLACEMENT, TRANSFEMORAL;  Surgeon: Sherren Mocha, MD;  Location: [place];  Service: Open Heart Surgery;  Laterality: N/A;   Social History:  reports that he quit smoking about 20 years ago. He has never used smokeless tobacco. He reports that he does not drink alcohol or use drugs.  Allergies  Allergen Reactions  . Dyflex-G [Dyphylline-Guaifenesin] Hives  . Eliquis [Apixaban] Other (See Comments)    Side effects not a true allergy- he stated that it made him feel bad    Family History  Adopted: Yes  Problem Relation Age of Onset  . Diabetes Mother   . CAD Mother   . Colon cancer Neg Hx   . Colon polyps Neg Hx      Prior to Admission medications   Medication Sig Start Date End Date Taking? Authorizing Provider  acetaminophen (TYLENOL) 500 MG tablet Take 1 tablet (500 mg total) by mouth 2 (two) times daily. 10/29/18    Kathyrn Drown, MD  blood glucose meter kit and supplies KIT Dispense based on patient and insurance preference. Test  up to four times daily as directed.Dx: E11.9 06/30/19   Kathyrn Drown, MD  calcium carbonate (TUMS - DOSED IN MG ELEMENTAL CALCIUM) 500 MG chewable tablet Chew 2 tablets by mouth 2 (two) times daily as needed for indigestion or heartburn.     [provider]  carvedilol (COREG) 3.125 MG tablet Take 1 tablet (3.125 mg total) by mouth 2 (two) times daily with a meal. 11/12/19   Luking, Elayne Snare, MD  FIBER PO Take 5 capsules by mouth daily.    [provider]  furosemide (LASIX) 40 MG tablet TAKE 2 TABLETS BY MOUTH TWICE DAILY. 11/03/19   [name] Lenis, MD  HYDROcodone-acetaminophen [facility]) 10-325 MG tablet Take one tablet po QID prn pain 10/24/19   Kathyrn Drown, MD  insulin aspart (NOVOLOG FLEXPEN) 100 UNIT/ML FlexPen Inject 3 Units into the skin 3 (three) times daily with meals. Patient taking differently: Inject 20 Units into the skin 3 (three) times daily with meals.  08/28/19   Eugenie Filler, MD  Insulin Detemir (LEVEMIR FLEXTOUCH) 100  UNIT/ML Pen Inject 40 Units into the skin at bedtime. 08/28/19   Eugenie Filler, MD  isosorbide dinitrate (ISORDIL) 30 MG tablet Take 1 tablet (30 mg total) by mouth daily. 08/28/19   Eugenie Filler, MD  linaclotide (LINZESS) 290 MCG CAPS capsule TAKE (1) CAPSULE BY MOUTH EACH MORNING BEFORE BREAKFAST. Patient taking differently: Take 290 mcg by mouth daily before breakfast.  09/01/19   Kathyrn Drown, MD  Menthol-Methyl Salicylate (GOODSENSE MUSCLE RUB) 8-30 % CREA Apply 1 application topically daily as needed (muscle pain).    [provider]  metolazone (ZAROXOLYN) 2.5 MG tablet TAKE ONE TABLET BY MOUTH DAILY. 11/19/19   [name] Lenis, MD  Multiple Vitamins-Minerals (CENTRUM SILVER PO) Take 1 tablet by mouth daily.     [provider]  nitroGLYCERIN (NITROSTAT) 0.4 MG SL tablet PLACE 1  TABLET UNDER TONGUE FOR CHEST PAIN. MAY REPEAT EVERY 5 MIN UP TO 3 DOSES-NO RELIEF,CALL 911. 11/03/19   Sherren Mocha, MD  nystatin (MYCOSTATIN) 100000 UNIT/ML suspension Take one tsp (34m) qid. Swish and swallow. Take for 10 days 09/20/19   LKathyrn Drown MD  pantoprazole (PROTONIX) 40 MG tablet Take 1 tablet (40 mg total) by mouth 2 (two) times daily. 09/01/19 08/31/20  LKathyrn Drown MD  potassium chloride SA (KLOR-CON M20) 20 MEQ tablet Take 2 tablets (40 mEq total) by mouth 2 (two) times daily. 08/28/19   TEugenie Filler MD  rivaroxaban (XARELTO) 20 MG TABS tablet Take 1 tablet (20 mg total) by mouth daily with supper. 05/23/19   BArnoldo Lenis MD  rosuvastatin (CRESTOR) 40 MG tablet TAKE ONE TABLET BY MOUTH DAILY. Patient taking differently: Take 40 mg by mouth daily.  07/18/19   LKathyrn Drown MD  sucralfate (CARAFATE) 1 g tablet Take 1 tablet (1 g total) by mouth 4 (four) times daily. 09/18/19   LKathyrn Drown MD  SURE COMFORT PEN NEEDLES 31G X 8 MM MISC USING 4 TIMES DAILY. 04/07/19   LKathyrn Drown MD  tamsulosin (FLOMAX) 0.4 MG CAPS capsule Take 1 capsule (0.4 mg total) by mouth daily. 09/01/19   LKathyrn Drown MD  torsemide (DEMADEX) 20 MG tablet Take 3 tablets po in the morning and 3 tablets po at 2 p.m. 10/31/19   LKathyrn Drown MD  traMADol (ULTRAM) 50 MG tablet TAKE 1 TABLET BY MOUTH EVERY 6 TO 8 HOURS AS NEEDED FOR HEADACHE. Patient taking differently: Take 50 mg by mouth every 6 (six) hours as needed (Headache).  10/14/19   LKathyrn Drown MD  trolamine salicylate (ASPERCREME) 10 % cream Apply 1 application topically 2 (two) times daily as needed for muscle pain.     [provider]   Physical Exam: Vitals:   11/26/19 0253603/31/21 0503 11/26/19 0515 11/26/19 0545  BP: (!) 147/128 98/78 112/76 (!) 115/96  Pulse: 87 84 86 (!) 29  Resp: 13 17 (!) 26 20  Temp:      TempSrc:      SpO2: 95% 95% 94% (!) 88%  Weight:      Height:        Wt Readings from Last  3 Encounters:  11/26/19 125 kg  10/24/19 118.8 kg  10/17/19 119.3 kg    . General:  Appears calm and comfortable. AAOx4. Sitting on edge of bed. Obese. . Eyes: EOMI, normal lids, irises & conjunctiva . ENT: grossly normal hearing . Neck: normal ROM . Cardiovascular: Tachycardic with regular rhythm and distant  heart sounds, no m/r/g. Diffuse pitting of edema of bilateral lower extremities to knees. Marland Kitchen Respiratory: CTA bilaterally. Normal effort.  . Abdomen: soft, distended, non-tender . Skin: bilateral lower extremity venous stasis . Musculoskeletal: grossly normal tone BUE/BLE . Psychiatric: grossly normal mood and affect, speech fluent and appropriate . Neurologic: grossly non-focal.          Labs on Admission:  Basic Metabolic Panel: Recent Labs  Lab 11/26/19 0120  NA 133*  K 4.7  CL 81*  CO2 42*  GLUCOSE 149*  BUN 53*  CREATININE 3.15*  CALCIUM 8.7*   Liver Function Tests: No results for input(s): AST, ALT, ALKPHOS, BILITOT, PROT, ALBUMIN in the last 168 hours. No results for input(s): LIPASE, AMYLASE in the last 168 hours. No results for input(s): AMMONIA in the last 168 hours. CBC: Recent Labs  Lab 11/26/19 0120  WBC 7.2  HGB 13.4  HCT 44.2  MCV 99.8  PLT 312   Cardiac Enzymes: No results for input(s): CKTOTAL, CKMB, CKMBINDEX, TROPONINI in the last 168 hours.  BNP (last 3 results) No results for input(s): BNP in the last 8760 hours.  ProBNP (last 3 results) No results for input(s): PROBNP in the last 8760 hours.  CBG: No results for input(s): GLUCAP in the last 168 hours.  Radiological Exams on Admission: DG Chest 2 View  Result Date: 11/26/2019 CLINICAL DATA:  Chest pain and shortness of breath EXAM: CHEST - 2 VIEW COMPARISON:  08/25/2019 FINDINGS: Small pleural effusions. Moderate cardiomegaly. Aortic valve replacement. Unchanged position of left chest wall AICD single lead. No focal airspace consolidation or pulmonary edema. IMPRESSION:  Cardiomegaly and small bilateral pleural effusions. Electronically Signed   By: Ulyses Jarred M.D.   On: 11/26/2019 01:58    EKG: Independently reviewed. HR 122, Sinus tachycardia. QTc 589. LBBB also noted on prior EKG Dec 2020. No STEMI.  Assessment/Plan Principal Problem:   Acute exacerbation of CHF (congestive heart failure) (HCC) Active Problems:   Hyperlipemia   Class 2 severe obesity due to excess calories with serious comorbidity and body mass index (BMI) of 39.0 to 39.9 in adult [facility]   Chronic combined systolic and diastolic CHF (congestive heart failure) (HCC)   Cardiomyopathy, ischemic   Obstructive sleep apnea   Type 2 diabetes with nephropathy (HCC)   Essential hypertension   Dyspnea   Prolonged Q-T interval on ECG   Constipation   Acute kidney injury (HCC)   CKD (chronic kidney disease) stage 3, GFR 30-59 ml/min   AICD (automatic cardioverter/defibrillator) present   S/P CABG x 6   S/P TAVR (transcatheter aortic valve replacement)   PAF (paroxysmal atrial fibrillation) ([place])  69 y.o. male with PMH Ischemic Cardiomyopathy, HFrEF (EF 15%), CAD s/p CABG, Aortic stenosis s/p TAVR, paroxysmal A fib, HTN, HLD, obesity, T2DM, CKD who presented to ED with chest pain and SOB and found to have AKI and acute CHF.  Chest pain in setting of known CAD S/p CABG -EKG with known LBBB -hx of worsening chest pain over last 2 weeks and increased use of Nitro -Trop 2 and 21 -Cardiology consulted with recs below -Trend cardiac biomarkers -Serial ECGs -Aspirin 81 mg daily -Continue Imdur -Continue rosuvastatin 40 mg nightly -Daily Mag, K -Tele  Acute on chronic systolic heart failure HTN -Increased SOB, orthopnea, LE swelling -Last Echo July 2020: EF 15-20% -Daily weights -Strict I and Os -Continue carvedilol 3.125 mg twice daily -Continue long-acting nitrate -Torsemide 20 mg 3 tablets twice daily - hold due to  AKI -Metolazone  - hold due to AKI -Spironolactone 25 mg daily  - hold due to AKI -Compression wraps for bilateral leg swelling -Wound Care consulted  -Needs Heart failure consult this AM; likely needs Lasix infusion-monitor closely in setting of prolonged QTc -BNP pending   AKI on CKD -Cr 3.15 on admission with BUN 53 -Baseline ~ 1.5-2.0 -Likely will improve with improved diuresis -Daily BMP -Hold nephrotoxic agents as able but likely does need Lasix drip  T2DM -Last A1c 7.7 in Dec 2020; repeat ordered -Home Regimen: Levemir 40 u qhs, Aspart 3 u with meals -Cont home regimen with sliding scale  Paroxysmal atrial fibrillation -CHA2DS2-VASc score of 4 -Takes Xarelto at home; hold due to CrCl -Heparin on admission due to AKI  HLD -Continue rosuvastatin 40 mg nightly  Code Status: Full  DVT Prophylaxis: Heparin; hold Xarelto due to AKI Family Communication: Wife at bedside Disposition Plan: Admit to inpatient. Patient is at high risk for further decompensation/death due to age and co-morbidities. Requiring IV medications and specialty consult.  Time spent: > 70 minutes  Chauncey Mann, MD [facility] Pager [phone]

## 2019-11-26 NOTE — Progress Notes (Signed)
  Echocardiogram 2D Echocardiogram with definity has been performed.  Darlina Sicilian M 11/26/2019, 10:58 AM

## 2019-11-26 NOTE — ED Provider Notes (Signed)
Emergency Department Provider Note   I have reviewed the triage vital signs and the nursing notes.   HISTORY  Chief Complaint Chest Pain   HPI [name] [name] is a 69 y.o. male who presents the emerge from today with chest pain.  Patient dates for last couple weeks is a progressively worsening left-sided chest pain pressure similar to previous heart attacks.  Initially it would go away with nitroglycerin and will happen once a day but it has gotten to the point now it is happening multiple times a day takes 2 or 3 nitroglycerin sometimes to get go away.  Tuesday around 2300 he had acute onset and did not go away with nitroglycerin so presents here for further evaluation.  No shortness of breath, diaphoresis, nausea or vomiting.  No other associated symptoms.  No fever or cough.   No other associated or modifying symptoms.    Past Medical History:  Diagnosis Date  . AICD (automatic cardioverter/defibrillator) present 05/18/2016  . Anginal pain ([place])    occ none recent  . Aortic stenosis   . Arthritis   . CAD (coronary artery disease)   . Cardiomyopathy, ischemic   . CHF (congestive heart failure) ([place])   . Chronic back pain   . Diabetes mellitus without complication ([place])   . Elevated PSA   . History of kidney stones   . Hyperlipidemia   . Morbid obesity ([place])   . Myocardial infarction ([place])   . OSA (obstructive sleep apnea)    does not wear CPAP, -order run out told need to redo  test  . Paroxysmal atrial fibrillation ([place]) 09/29/2018   This was found on event monitor October 2019  . 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 Acute exacerbation of CHF (congestive heart failure) ([place])  11/26/2019  . History of gastric ulcer 10/10/2019  . Colon cancer screening 10/10/2019  . Esophagitis 10/10/2019  . Dehydration   . Ventricular tachycardia ([place]) 08/25/2019  . VT (ventricular tachycardia) ([place]) 08/25/2019  . IDA (iron deficiency anemia) 11/13/2018  . CKD stage 2 due to type 2 diabetes mellitus ([place])   . Acute gastric ulcer with hemorrhage   . Acute upper GI bleed 10/02/2018  . GI bleed 10/01/2018  . Acute blood loss anemia   . PAF (paroxysmal atrial fibrillation) ([place]) 09/29/2018  . 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 Aortic stenosis   . S/P nasal septoplasty 04/25/2017  . AICD (automatic cardioverter/defibrillator) present 11/22/2016  . CKD (chronic kidney disease) stage 3, GFR 30-59 ml/min   . Acute kidney injury ([place])   . Difficulty in walking, not elsewhere classified   . Pain in the abdomen   . Anasarca   . Constipation   . Dyspnea 09/11/2016  . Prolonged Q-T interval on ECG 09/11/2016  . Hypokalemia 09/11/2016  . Chest pain 10/22/2015  . Type 2 diabetes with nephropathy ([place]) 10/22/2015  . Essential hypertension   . Chronic lumbar pain 01/01/2015  . Obstructive sleep apnea 10/02/2014  . Cardiomyopathy, ischemic 06/25/2014  . Chronic combined systolic and diastolic CHF (congestive heart failure) ([place]) 10/29/2013  . Right ankle pain 08/14/2013  . Hyperlipemia 05/14/2013  . CAD (  coronary artery disease) 05/14/2013  . Class 2 severe obesity due to excess calories with serious comorbidity and body mass index (BMI) of 39.0 to 39.9 in adult ([place]) 05/14/2013  . Elevated PSA 05/14/2013  . 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 APPENDECTOMY    . BIOPSY  10/02/2018   Procedure: BIOPSY;  Surgeon: Daneil Dolin, MD;  [facility];  Service: Endoscopy;;  gastric  . CARDIAC CATHETERIZATION     stents  . CAROTID STENT     denies carotid stent  . CATARACT EXTRACTION, BILATERAL      . CORONARY ARTERY BYPASS GRAFT  12/23/1999   LIMA to LAD, SVG to ramus, sequential SVG to OM2-RPL, sequential SVG to AM-PDA  . EP IMPLANTABLE DEVICE N/A 05/18/2016   Procedure: ICD Implant;  Surgeon: Will Meredith Leeds, MD;  Location: [facility];  Service: Cardiovascular;  Laterality: N/A;  . ESOPHAGOGASTRODUODENOSCOPY (EGD) WITH PROPOFOL N/A 10/02/2018   Procedure: ESOPHAGOGASTRODUODENOSCOPY (EGD) WITH PROPOFOL;  Surgeon: Daneil Dolin, MD; Mild Schatzki ring, small hiatal hernia, nonbleeding cratered gastric ulcer with no stigmata of bleeding, erosive gastropathy s/p biopsy, normal duodenum.  Findings suspicious for occult NSAID use.  Pathology with reactive gastropathy, no H. pylori.  . EYE SURGERY    . ingrown toenail N/A   . LEFT HEART CATHETERIZATION WITH CORONARY/GRAFT ANGIOGRAM N/A 12/21/2014   Procedure: LEFT HEART CATHETERIZATION WITH Beatrix Fetters;  Surgeon: Belva Crome, MD;  Location: [facility] CATH LAB;  Service: Cardiovascular;  Laterality: N/A;  . NASAL SEPTOPLASTY W/ TURBINOPLASTY Bilateral 04/25/2017   NASAL SEPTOPLASTY WITH TURBINATE REDUCTION/notes 04/25/2017  . NASAL SEPTOPLASTY W/ TURBINOPLASTY Bilateral 04/25/2017   Procedure: NASAL SEPTOPLASTY WITH TURBINATE REDUCTION;  Surgeon: Leta Baptist, MD;  Location: [place];  Service: ENT;  Laterality: Bilateral;  . RIGHT/LEFT HEART CATH AND CORONARY/GRAFT ANGIOGRAPHY N/A 10/05/2017   Procedure: RIGHT/LEFT HEART CATH AND CORONARY/GRAFT ANGIOGRAPHY;  Surgeon: Sherren Mocha, MD;  Location: [facility] [facility];  Service: Cardiovascular;  Laterality: N/A;  . TEE WITHOUT CARDIOVERSION N/A 11/20/2017   Procedure: TRANSESOPHAGEAL ECHOCARDIOGRAM (TEE);  Surgeon: Sherren Mocha, MD;  Location: [place];  Service: Open Heart Surgery;  Laterality: N/A;  . TONSILLECTOMY    . TRANSCATHETER AORTIC VALVE REPLACEMENT, TRANSFEMORAL N/A 11/20/2017   Procedure: TRANSCATHETER AORTIC VALVE REPLACEMENT, TRANSFEMORAL;  Surgeon: Sherren Mocha, MD;   Location: [facility];  Service: Open Heart Surgery;  Laterality: N/A;    Current Outpatient Rx  . Order #: [id number] Class: Normal  . Order #: [id number] Class: Print  . Order #: [id number] Class: Historical Med  . Order #: [id number] Class: Normal  . Order #: [id number] Class: Historical Med  . Order #: [id number] Class: Normal  . Order #: [id number] Class: Normal  . Order #: [id number] Class: Print  . Order #: [id number] Class: Print  . Order #: [id number] Class: Print  . Order #: [id number] Class: Normal  . Order #: [id number] Class: Historical Med  . Order #: [id number] Class: Normal  . Order #: [id number] Class: Historical Med  . Order #: [id number] Class: Normal  . Order #: [id number] Class: Normal  . Order #: [id number] Class: Normal  . Order #: [id number] Class: Print  . Order #: [id number] Class: No Print  . Order #: [id number] Class: Normal  . Order #: [id number] Class: Normal  . Order #: [id number] Class: Normal  . Order #: [id number] Class: Normal  . Order #: [id number] Class: Normal  . Order #: [id number] Class: Normal  . Order #: [id number] Class: Historical Med    Allergies Dyflex-g [dyphylline-guaifenesin] and Eliquis [apixaban]  Family History  Adopted: Yes  Problem Relation Age of Onset  . Diabetes Mother   . CAD Mother   . Colon cancer Neg Hx   . Colon polyps Neg Hx     Social History Social History   Tobacco Use  . Smoking status: Former Smoker    Quit date: 08/29/1999    Years since quitting: 20.2  . Smokeless tobacco: Never Used  . Tobacco comment: couldn't remember date  Substance Use Topics  . Alcohol use: No    Alcohol/week: 0.0 standard drinks  . Drug use: No    Review of Systems  All other systems negative except as documented in the HPI. All pertinent positives and negatives as reviewed in the HPI. ____________________________________________   PHYSICAL EXAM:  VITAL SIGNS: ED Triage Vitals  Enc Vitals Group     BP 11/26/19 0106 106/76     Pulse Rate 11/26/19 0106 (!) 122      Resp 11/26/19 0106 18     Temp 11/26/19 0106 (!) 97.5 F (36.4 C)     Temp Source 11/26/19 0106 Oral     SpO2 11/26/19 0106 100 %     Weight 11/26/19 0112 275 lb 9.2 oz (125 kg)     Height 11/26/19 0112 5\' 10"  (1.778 m)    Constitutional: Alert and oriented. Well appearing and in no acute distress. Eyes: Conjunctivae are normal. PERRL. EOMI. Head: Atraumatic. Nose: No congestion/rhinnorhea. Mouth/Throat: Mucous membranes are moist.  Oropharynx non-erythematous. Neck: No stridor.  No meningeal signs.   Cardiovascular: Normal rate, regular rhythm. Good peripheral circulation. Grossly normal heart sounds.   Respiratory: Normal respiratory effort.  No retractions. Lungs CTAB. Gastrointestinal: Soft and nontender. No distention.  Musculoskeletal: No lower extremity tenderness nor edema. No gross deformities of extremities. Neurologic:  Normal speech and language. No gross focal neurologic deficits are appreciated.  Skin:  Skin is warm, dry and intact. No rash noted.   ____________________________________________   LABS (all labs ordered are listed, but only abnormal results are displayed)  Labs Reviewed  BASIC METABOLIC PANEL - Abnormal; Notable for the following components:      Result Value   Sodium 133 (*)    Chloride 81 (*)    CO2 42 (*)    Glucose, Bld 149 (*)    BUN 53 (*)    Creatinine, Ser 3.15 (*)    Calcium 8.7 (*)    GFR calc non Af Amer 19 (*)    GFR calc Af Amer 22 (*)    All other components within normal limits  CBC - Abnormal; Notable for the following components:   RDW 16.8 (*)    All other components within normal limits  PROTIME-INR - Abnormal; Notable for the following components:   Prothrombin Time 22.5 (*)    INR 2.0 (*)    All other components within normal limits  TROPONIN I (HIGH SENSITIVITY) - Abnormal; Notable for the following components:   Troponin I (High Sensitivity) 22 (*)    All other components within normal limits  TROPONIN I (HIGH  SENSITIVITY) - Abnormal; Notable for the following components:   Troponin I (High Sensitivity) 21 (*)    All other components within normal limits  SARS CORONAVIRUS 2 (TAT 6-24 HRS)  BRAIN NATRIURETIC PEPTIDE  HIV ANTIBODY (ROUTINE TESTING W REFLEX)  HEMOGLOBIN A1C  TROPONIN I (HIGH SENSITIVITY)   ____________________________________________  EKG   EKG Interpretation  Date/Time:  Wednesday November 26 2019 00:57:17 EDT Ventricular Rate:  122  PR Interval:  186 QRS Duration: 152 QT Interval:  414 QTC Calculation: 589 R Axis:   120 Text Interpretation: Sinus tachycardia Non-specific intra-ventricular conduction block Possible Lateral infarct , age undetermined Abnormal ECG LBBB similar to previous Confirmed by Merrily Pew [phone]) on 11/26/2019 1:08:54 AM      ____________________________________________  RADIOLOGY  DG Chest 2 View  Result Date: 11/26/2019 CLINICAL DATA:  Chest pain and shortness of breath EXAM: CHEST - 2 VIEW COMPARISON:  08/25/2019 FINDINGS: Small pleural effusions. Moderate cardiomegaly. Aortic valve replacement. Unchanged position of left chest wall AICD single lead. No focal airspace consolidation or pulmonary edema. IMPRESSION: Cardiomegaly and small bilateral pleural effusions. Electronically Signed   By: Ulyses Jarred M.D.   On: 11/26/2019 01:58    ____________________________________________   PROCEDURES  Procedure(s) performed:   Procedures   ____________________________________________   INITIAL IMPRESSION / ASSESSMENT AND PLAN / ED COURSE  Concern for unstable angina.  In triage his heart rate is 122 by my evaluation and less than 90.  Also has what appears to be a pretty significant acute kidney injury.  Will discuss with cardiology for admission versus medicine.  Cardiology will consult, recommends admit to medicine 2/2 AKI.   Discussed with medicine for admit.    Pertinent labs & imaging results that were available during my care of  the patient were reviewed by me and considered in my medical decision making (see chart for details). ____________________________________________  FINAL CLINICAL IMPRESSION(S) / ED DIAGNOSES  Final diagnoses:  Nonspecific chest pain  Unstable angina (HCC)    MEDICATIONS GIVEN DURING THIS VISIT:  Medications  nitroGLYCERIN (NITROSTAT) SL tablet 0.4 mg (0.4 mg Sublingual Given 11/26/19 0451)  acetaminophen (TYLENOL) tablet 500 mg (has no administration in time range)  carvedilol (COREG) tablet 3.125 mg (has no administration in time range)  insulin aspart (novoLOG) injection 3 Units (has no administration in time range)  insulin detemir (LEVEMIR) injection 40 Units (has no administration in time range)  tamsulosin (FLOMAX) capsule 0.4 mg (has no administration in time range)  rosuvastatin (CRESTOR) tablet 40 mg (has no administration in time range)  heparin injection 5,000 Units (5,000 Units Subcutaneous Given 11/26/19 0639)  aspirin EC tablet 81 mg (has no administration in time range)  insulin aspart (novoLOG) injection 0-9 Units (has no administration in time range)  isosorbide dinitrate (ISORDIL) tablet 30 mg (has no administration in time range)  sodium chloride flush (NS) 0.9 % injection 3 mL (3 mLs Intravenous Given 11/26/19 0604)  acetaminophen (TYLENOL) tablet 650 mg (650 mg Oral Given 11/26/19 0450)     NEW OUTPATIENT MEDICATIONS STARTED DURING THIS VISIT:  New Prescriptions   No medications on file    Note:  This note was prepared with assistance of Dragon voice recognition software. Occasional wrong-word or sound-a-like substitutions may have occurred due to the inherent limitations of voice recognition software.   Easton Fetty, Corene Cornea, MD 11/26/19 [phone]

## 2019-11-26 NOTE — Progress Notes (Addendum)
Progress Note  Patient Name: [name] [name] Date of Encounter: 11/26/2019  Primary Cardiologist: Carlyle Dolly, MD   Subjective   Patient admitted early this morning with chest pain and shortness of breath despite increase in home Torsemide and found to have AKI with creatinine of 3.15. Patient reports he is feeling better since he first arrived. Denies any chest pain or shortness of breath at this time.   Inpatient Medications    Scheduled Meds: . acetaminophen  500 mg Oral BID  . aspirin EC  81 mg Oral Daily  . carvedilol  3.125 mg Oral BID WC  . clotrimazole-betamethasone   Topical BID  . furosemide  80 mg Intravenous BID  . insulin aspart  0-9 Units Subcutaneous TID WC  . insulin aspart  3 Units Subcutaneous TID WC  . insulin detemir  40 Units Subcutaneous QHS  . isosorbide dinitrate  30 mg Oral Daily  . metolazone  2.5 mg Oral Once  . rosuvastatin  40 mg Oral Daily  . tamsulosin  0.4 mg Oral Daily   Continuous Infusions:  PRN Meds: metoprolol tartrate, nitroGLYCERIN, perflutren lipid microspheres (DEFINITY) IV suspension   Vital Signs    Vitals:   11/26/19 0828 11/26/19 1000 11/26/19 1015 11/26/19 1122  BP: (!) 124/95 107/75  110/80  Pulse: (!) 124 81 (!) 113 80  Resp: (!) 23 14 (!) 22 19  Temp:      TempSrc:      SpO2: 96% 92% 95% 96%  Weight:      Height:        Intake/Output Summary (Last 24 hours) at 11/26/2019 1221 Last data filed at 11/26/2019 0805 Gross per 24 hour  Intake -  Output 375 ml  Net -375 ml   Last 3 Weights 11/26/2019 10/24/2019 10/17/2019  Weight (lbs) 275 lb 9.2 oz 262 lb 263 lb  Weight (kg) 125 kg 118.842 kg 119.296 kg      Telemetry    Normal sinus rhythm with PACs/PVCs - Personally Reviewed  ECG    Sinus tachycardia, rate 122 bpm, with LBBB. QTc 589 ms.  - Personally Reviewed  Physical Exam   GEN: No acute distress.   Neck: Supple. Difficult to assess JVD due to body habitus and patient sitting completely upright.  Cardiac: RRR. No murmurs, rubs, or gallops. Radial pulses 2+ and equal bilaterally.  Respiratory: No increased work of breathing. Decreased breath sounds in bases but otherwise lungs clear.  GI: Soft, non-distended, and non-tender.  MS: 1-2+ pitting edema of bilateral lower extremity edema with erythema and weeping. No deformity. Skin: Extremities slightly cool but dry. Neuro:  No focal deficits. Psych: Normal affect. Responds appropriately.   Labs    High Sensitivity Troponin:   Recent Labs  Lab 11/26/19 0120 11/26/19 0330 11/26/19 0621 11/26/19 0808  TROPONINIHS 22* 21* 18* 16      Chemistry Recent Labs  Lab 11/26/19 0120  NA 133*  K 4.7  CL 81*  CO2 42*  GLUCOSE 149*  BUN 53*  CREATININE 3.15*  CALCIUM 8.7*  GFRNONAA 19*  GFRAA 22*  ANIONGAP 10     Hematology Recent Labs  Lab 11/26/19 0120  WBC 7.2  RBC 4.43  HGB 13.4  HCT 44.2  MCV 99.8  MCH 30.2  MCHC 30.3  RDW 16.8*  PLT 312    BNP Recent Labs  Lab 11/26/19 0450  BNP 882.5*     DDimer No results for input(s): DDIMER in the last 168 hours.  Radiology    DG Chest 2 View  Result Date: 11/26/2019 CLINICAL DATA:  Chest pain and shortness of breath EXAM: CHEST - 2 VIEW COMPARISON:  08/25/2019 FINDINGS: Small pleural effusions. Moderate cardiomegaly. Aortic valve replacement. Unchanged position of left chest wall AICD single lead. No focal airspace consolidation or pulmonary edema. IMPRESSION: Cardiomegaly and small bilateral pleural effusions. Electronically Signed   By: Ulyses Jarred M.D.   On: 11/26/2019 01:58    Cardiac Studies   Right/Left Heart Catheterization 10/05/2017: 1.  Severe three-vessel coronary artery disease with total occlusion of the RCA, total occlusion of the left circumflex, and total occlusion of the LAD just after the first diagonal 2.  Status post aortocoronary bypass surgery with patent grafts including LIMA to LAD, saphenous vein graft to ramus intermedius, saphenous  vein graft to second obtuse marginal, and saphenous vein graft to right PDA 3.  Calcified, restricted aortic valve under plain fluoroscopy, with mean transvalvular gradient 27 mmHg  Recommendation: continued evaluation for progressive aortic stenosis suspicious for low flow low gradient aortic stenosis, CT angiography of the heart and peripheral vessels will be ordered and multidisciplinary evaluation by cardiac surgery. _______________  Echocardiogram 03/03/2019: Impressions: 1. The left ventricle has a visually estimated ejection fraction of  15-20%. The cavity size was moderately dilated. Left ventricular diastolic  Doppler parameters are indeterminate.  2. The right ventricle has moderately reduced systolic function. The  cavity was moderately enlarged. There is no increase in right ventricular  wall thickness. Right ventricular systolic pressure 06.2 mmHg.  3. Left atrial size was severely dilated.  4. Right atrial size was mildly dilated.  5. There is mild mitral annular calcification present.  6. A 49mm Edwards Sapien bioprosthetic aortic valve (TAVR) valve is  present in the aortic position. Procedure Date: 11/20/2017. Leaflet  excursion appears normal. No prosthetic or paravalvular regurgitation.  Mean systolic gradient 8 mmHg.  7. The aortic root and ascending aorta are normal in size and structure.  8. The inferior vena cava was dilated in size with <50% respiratory  variability.  9. No intracardiac thrombi or masses were visualized with Definity  contrast.  10. When compared to the prior study: 01/04/2018 - no significant change  has occured. Aortic valve systolic Doppler gradient better visualized on  today's exam.   Patient Profile   [name] [name] is a 69 y.o. male with a history of CAD s/p CABG x6 in 2001, ischemic cardiomyopathy with EF of 15-20% s/p ICD in 04/2016, paroxysmal atrial fibrillation on Xarelto, LBBB, aortic stenosis s/p TAVR in 10/2017, hypertension,  hyperlipidemia, type 2 diabetes mellitus, CKD stage III, and obesity who is being seen for evaluation of CHF at the request of Dr. Dayna Barker.   Assessment & Plan    Acute on Chronic Systolic CHF/Ischemic Cardiomyopathy - Patient presented with chest pain and shortness of breath with orthopnea and significant lower extremity edema despite PCP increasing home Torsemide.  - BNP elevated in the 800's.  - Chest x-ray showed cardiomegaly with small bilateral pleural effusion but no overt edema.  - Last Echo from LVEF of 15-20%.  - Patient has significant lower extremity edema with weeping of legs but lungs are clear.  - IV Lasix 80mg  twice daily has been ordered by primary team. Will need to monitor renal function closely and may need to consult Nephrology. - Continue home Coreg for now. However, may need to discontinue if concern for low output.  - Agree with leg wraps/compression stockings for lower extremity edema.  -  Monitor daily weights, strict I/O's, and renal function. - Will discuss with Dr. Acie Fredrickson before reaching out to CHF team.   Chest Pain with Known CAD - History of CAD s/p CABG in 2001. Most recent cath in 09/2017 showed patent grafts.  - EKG shows sinus tachycardia with known LBBB. - High-sensitivity initially minimally elevated but have normalized (22 >> 21 >> 18 >> 16). Not consistent with ACS. - Echo pending. - Currently chest pain free.  - Continue aspirin, beta-blocker, and statin.  - Will continue to monitor. Further recommendations pending Echo. Not a good cath candidate at this time given AKI.  Aortic Stenosis s/p TAVR in 10/2017 - Most recent Echo in 10/2017 showed 71mm Edwards Sapien bioprosthetic valve with nor paravalvular regurgitation. Mean gradient 8 mmHg.  - Repeat Echo pending.   Paroxysmal Atrial Fibrillation  - Currently in sinus rhythm. - Continue Coreg as above. - On Xarelto at home. However, will be started on Heparin due to AKI.   Hypertension - BP soft  but stable.  - Continue current medications.   Hyperlipidemia - Continue Crestor 40mg  daily.   Acute on Chronic Kidney Disease Stage III - Creatinine 3.15 on admission. Baseline around 1.5.  - Avoid Nephrotoxic agents. - Continue to monitor.  - Consider renal ultrasound.  - May need to consult Nephrology.  For questions or updates, please contact [facility] Please consult [email or web address] for contact info under        Signed, Darreld Mclean, PA-C  11/26/2019, 12:21 PM     Attending Note:   The patient was seen and examined.  Agree with assessment and plan as noted above.  Changes made to the above note as needed.  Patient seen and independently examined with Cletus Gash, PA .   We discussed all aspects of the encounter. I agree with the assessment and plan as stated above.  1.  Acute on chronic combined systolic and diastolic congestive heart failure. I have personally reviewed the echo images from this morning.  The study has not been read yet but he has markedly decreased LV systolic function as well as diastolic function.  His ejection fraction is around 20%.  He has at least mild mitral regurgitation.  His RV is enlarged.  The TAVR appears to be functioning normally.  He has at least moderate pulmonary hypertension.  Ronalee Belts had some dental work performed several weeks ago due to dental abscess.  Since that time he has been eating soft foods  including Campbell soup and macaroni and cheese.  Has been eating lots of extra salt.  He has not been exercising.  In addition, the torsemide stopped working.    I agree with starting him on IV Lasix that she may need a continuous IV Lasix drip. He will need to elevate his legs higher than his heart.  Almost all of his edema is in the lower half of his body.  He does have significant abdominal wall pitting as well as pitting edema in his thighs and calves.  He also has weeping edema in his calves.  We will continue with an  aggressive approach with using IV Lasix.  He needs salt restriction leg elevation.  2.  Acute on chronic kidney disease: Further plans per internal medicine.  3.  Status post TAVR: The echo looks like his TAVR is working quite well.  4.  Hyperlipidemia: Continue Crestor.  I have spent a total of 40 minutes with patient reviewing hospital  notes , telemetry, EKGs,  labs and examining patient as well as establishing an assessment and plan that was discussed with the patient. > 50% of time was spent in direct patient care.    Thayer Headings, Brooke Bonito., MD, [facility] 11/26/2019, 1:36 PM [address]  [place] Pager [phone]

## 2019-11-26 NOTE — Progress Notes (Signed)
ANTICOAGULATION CONSULT NOTE - Initial Consult  Pharmacy Consult for lovenox Indication: atrial fibrillation  Allergies  Allergen Reactions  . Dyflex-G [Dyphylline-Guaifenesin] Hives  . Eliquis [Apixaban] Other (See Comments)    Side effects not a true allergy- he stated that it made him feel bad    Patient Measurements: Height: 5\' 10"  (177.8 cm) Weight: 268 lb 12.8 oz (121.9 kg) IBW/kg (Calculated) : 73  Heparin Dosing Weight: 100.5 kg  Vital Signs: Temp: 98.1 F (36.7 C) (03/31 1441) Temp Source: Oral (03/31 1441) BP: 101/67 (03/31 1441) Pulse Rate: 80 (03/31 1441)  Labs: Recent Labs    11/26/19 0120 11/26/19 0120 11/26/19 0330 11/26/19 0621 11/26/19 0808  HGB 13.4  --   --   --   --   HCT 44.2  --   --   --   --   PLT 312  --   --   --   --   LABPROT 22.5*  --   --   --   --   INR 2.0*  --   --   --   --   CREATININE 3.15*  --   --   --   --   TROPONINIHS 22*   < > 21* 18* 16   < > = values in this interval not displayed.    Estimated Creatinine Clearance: 29 mL/min (A) (by C-G formula based on SCr of 3.15 mg/dL (H)).   Medical History: Past Medical History:  Diagnosis Date  . AICD (automatic cardioverter/defibrillator) present 05/18/2016  . Anginal pain ([place])    occ none recent  . Aortic stenosis   . Arthritis   . CAD (coronary artery disease)   . Cardiomyopathy, ischemic   . CHF (congestive heart failure) ([name])   . Chronic back pain   . Diabetes mellitus without complication ([place])   . Elevated PSA   . History of kidney stones   . Hyperlipidemia   . Morbid obesity ([place])   . Myocardial infarction ([place])   . OSA (obstructive sleep apnea)    does not wear CPAP, -order run out told need to redo  test  . Paroxysmal atrial fibrillation ([place]) 09/29/2018   This was found on event monitor October 2019  . 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 He is on chronic Xarelto for afib, with his last dose being last night.  Pharmacy was consulted to transition pt to Lovenox temporarily. CBC is WNL. No bleeding noted. Of note, pt did receive a dose of heparin SQ at 0639 AM this morning.   Pharmacy is now consulted to dose heparin for a fib (Lovenox had been scheduled to start this evening, so pt did not receive any doses of Lovenox yet). Will monitor anticoagulation using aPTT until effect of Xarelto on anticoagulation is out of the pt's system and aPTT and heparin levels correlate.  Goal of Therapy:  Heparin level: 0.3-0.7 units/ml Monitor platelets by anticoagulation protocol: Yes   Plan:  Heparin infusion at 1400 units/hr (will omit bolus since some effect of Xarelto still likely on board, given AKI) Check 8-hr aPTT, heparin level Monitor daily aPTT, heparin level, CBC Monitor  for signs/symptoms of bleeding  Gillermina Hu, PharmD, BCPS, [facility] Clinical Pharmacist 11/26/2019,5:05 PM

## 2019-11-26 NOTE — Progress Notes (Addendum)
[name] [name], is a 69 y.o. male, DOB - 1950/11/11, MRN:[id number]  69 year old Caucasian male who was admitted few hours ago with symptoms of CHF with known past medical history of ischemic cardiomyopathy with chronic systolic heart failure EF of 15%, s/p CABG, aortic stenosis s/p TAVR, paroxysmal A. fib [place] vas 2 score of greater than 3, hypertension, dyslipidemia, morbid obesity, type 2 diabetes, CKD 3 who presented to the hospital with chief complaints of chest pain and shortness of breath along with leg edema.  He was seen by cardiology in diagnosed with acute on chronic systolic heart failure, he was started on diuretics, chest pain was thought to be secondary to his CHF in the setting of underlying CAD CABG.  He was also found to have AKI with a baseline creatinine of around 1.7 which has now worsened to around 3, is being diuresed and his renal function will be monitored.  He is currently placed on combination of aspirin, Coreg, his Xarelto is held due to AKI which he takes for stroke prophylaxis due to paroxysmal A. fib, will place him on adjusted dose Lovenox instead, will add scheduled IV Lasix along with Zaroxolyn for continued diuresis which he requires, note he takes high doses of diuretics at home, as needed IV Lopressor added for better rate control if needed, if blood pressure remains soft and heart rate becomes an issue he may require amiodarone.  His Covid test is pending.  Overall he is much improved already.  Cardiology is following.   Vitals:   11/26/19 0730 11/26/19 0828 11/26/19 1000 11/26/19 1015  BP: 118/77 (!) 124/95 107/75   Pulse: 82 (!) 124 81 (!) 113  Resp: (!) 27 (!) 23 14 (!) 22  Temp:      TempSrc:      SpO2: 94% 96% 92% 95%  Weight:      Height:       Brief physical exam.  Patient sitting in hospital ER bed on room air and comfortable Minimal bibasilar Rales good air movement  bilaterally Irregular rate rhythm S1-S2 Abdomen soft nontender 2+ leg edema     Data Review   Micro Results No results found for this or any previous visit (from the past 240 hour(s)).  Radiology Reports DG Chest 2 View  Result Date: 11/26/2019 CLINICAL DATA:  Chest pain and shortness of breath EXAM: CHEST - 2 VIEW COMPARISON:  08/25/2019 FINDINGS: Small pleural effusions. Moderate cardiomegaly. Aortic valve replacement. Unchanged position of left chest wall AICD single lead. No focal airspace consolidation or pulmonary edema. IMPRESSION: Cardiomegaly and small bilateral pleural effusions. Electronically Signed   By: Ulyses Jarred M.D.   On: 11/26/2019 01:58    CBC Recent Labs  Lab 11/26/19 0120  WBC 7.2  HGB 13.4  HCT 44.2  PLT 312  MCV 99.8  MCH 30.2  MCHC 30.3  RDW 16.8*    Chemistries  Recent Labs  Lab 11/26/19 0120  NA 133*  K 4.7  CL 81*  CO2 42*  GLUCOSE 149*  BUN 53*  CREATININE 3.15*  CALCIUM 8.7*   ------------------------------------------------------------------------------------------------------------------ estimated creatinine clearance is 29.4 mL/min (A) (by C-G formula based on SCr of 3.15 mg/dL (H)). ------------------------------------------------------------------------------------------------------------------ Recent Labs    11/26/19 0605  HGBA1C 7.2*   ------------------------------------------------------------------------------------------------------------------ No results for input(s): CHOL, HDL, LDLCALC, TRIG, CHOLHDL, LDLDIRECT in the last 72 hours. ------------------------------------------------------------------------------------------------------------------ No results for input(s): TSH, T4TOTAL, T3FREE, THYROIDAB in the last 72 hours.  Invalid input(s): FREET3 ------------------------------------------------------------------------------------------------------------------ No results for input(s):  VITAMINB12, FOLATE,  FERRITIN, TIBC, IRON, RETICCTPCT in the last 72 hours.  Coagulation profile Recent Labs  Lab 11/26/19 0120  INR 2.0*    No results for input(s): DDIMER in the last 72 hours.  Cardiac Enzymes No results for input(s): CKMB, TROPONINI, MYOGLOBIN in the last 168 hours.  Invalid input(s): CK ------------------------------------------------------------------------------------------------------------------ Invalid input(s): POCBNP   Signature  Lala Lund M.D on 11/26/2019 at 11:22 AM   -  To page go to [email or web address]

## 2019-11-27 LAB — CBC
HCT: 40 % (ref 39.0–52.0)
Hemoglobin: 12.7 g/dL — ABNORMAL LOW (ref 13.0–17.0)
MCH: 30.5 pg (ref 26.0–34.0)
MCHC: 31.8 g/dL (ref 30.0–36.0)
MCV: 95.9 fL (ref 80.0–100.0)
Platelets: 291 10*3/uL (ref 150–400)
RBC: 4.17 MIL/uL — ABNORMAL LOW (ref 4.22–5.81)
RDW: 16.7 % — ABNORMAL HIGH (ref 11.5–15.5)
WBC: 6.4 10*3/uL (ref 4.0–10.5)
nRBC: 0 % (ref 0.0–0.2)

## 2019-11-27 LAB — MAGNESIUM: Magnesium: 2.6 mg/dL — ABNORMAL HIGH (ref 1.7–2.4)

## 2019-11-27 LAB — COMPREHENSIVE METABOLIC PANEL
ALT: 12 U/L (ref 0–44)
AST: 20 U/L (ref 15–41)
Albumin: 2.9 g/dL — ABNORMAL LOW (ref 3.5–5.0)
Alkaline Phosphatase: 110 U/L (ref 38–126)
Anion gap: 16 — ABNORMAL HIGH (ref 5–15)
BUN: 53 mg/dL — ABNORMAL HIGH (ref 8–23)
CO2: 35 mmol/L — ABNORMAL HIGH (ref 22–32)
Calcium: 8.6 mg/dL — ABNORMAL LOW (ref 8.9–10.3)
Chloride: 84 mmol/L — ABNORMAL LOW (ref 98–111)
Creatinine, Ser: 2.53 mg/dL — ABNORMAL HIGH (ref 0.61–1.24)
GFR calc Af Amer: 29 mL/min — ABNORMAL LOW (ref >60)
GFR calc non Af Amer: 25 mL/min — ABNORMAL LOW (ref >60)
Glucose, Bld: 174 mg/dL — ABNORMAL HIGH (ref 70–99)
Potassium: 3.2 mmol/L — ABNORMAL LOW (ref 3.5–5.1)
Sodium: 135 mmol/L (ref 135–145)
Total Bilirubin: 1.7 mg/dL — ABNORMAL HIGH (ref 0.3–1.2)
Total Protein: 6.9 g/dL (ref 6.5–8.1)

## 2019-11-27 LAB — APTT: aPTT: 170 seconds (ref 24–36)

## 2019-11-27 LAB — GLUCOSE, CAPILLARY
Glucose-Capillary: 78 mg/dL (ref 70–99)
Glucose-Capillary: 182 mg/dL — ABNORMAL HIGH (ref 70–99)
Glucose-Capillary: 142 mg/dL — ABNORMAL HIGH (ref 70–99)
Glucose-Capillary: 139 mg/dL — ABNORMAL HIGH (ref 70–99)

## 2019-11-27 LAB — BRAIN NATRIURETIC PEPTIDE: B Natriuretic Peptide: 1036.2 pg/mL — ABNORMAL HIGH (ref 0.0–100.0)

## 2019-11-27 LAB — HEPARIN LEVEL (UNFRACTIONATED): Heparin Unfractionated: >2.2 IU/mL — ABNORMAL HIGH (ref 0.30–0.70)

## 2019-11-27 MED ORDER — AMIODARONE HCL IN DEXTROSE 360-4.14 MG/200ML-% IV SOLN
60.0000 mg/h | INTRAVENOUS | Status: AC
  Administered 2019-11-27 (×2): 60 mg/h via INTRAVENOUS
  Filled 2019-11-27 (×2): qty 200

## 2019-11-27 MED ORDER — METOLAZONE 5 MG PO TABS
2.5000 mg | ORAL_TABLET | Freq: Once | ORAL | Status: AC
  Administered 2019-11-27: 2.5 mg via ORAL
  Filled 2019-11-27: qty 1

## 2019-11-27 MED ORDER — ONDANSETRON HCL 4 MG/2ML IJ SOLN
4.0000 mg | Freq: Once | INTRAMUSCULAR | Status: AC
  Administered 2019-11-27: 4 mg via INTRAVENOUS
  Filled 2019-11-27: qty 2

## 2019-11-27 MED ORDER — ENOXAPARIN SODIUM 120 MG/0.8ML ~~LOC~~ SOLN
1.0000 mg/kg | SUBCUTANEOUS | Status: DC
  Administered 2019-11-27 – 2019-11-29 (×3): 120 mg via SUBCUTANEOUS
  Filled 2019-11-27 (×4): qty 0.8

## 2019-11-27 MED ORDER — HEPARIN (PORCINE) 25000 UT/250ML-% IV SOLN
1000.0000 [IU]/h | INTRAVENOUS | Status: DC
  Administered 2019-11-27: 1000 [IU]/h via INTRAVENOUS

## 2019-11-27 MED ORDER — HYDROCODONE-ACETAMINOPHEN 5-325 MG PO TABS
1.0000 | ORAL_TABLET | Freq: Once | ORAL | Status: AC
  Administered 2019-11-27: 1 via ORAL
  Filled 2019-11-27: qty 1

## 2019-11-27 MED ORDER — POLYETHYLENE GLYCOL 3350 17 G PO PACK
17.0000 g | PACK | Freq: Every day | ORAL | Status: DC | PRN
  Administered 2019-11-27: 17 g via ORAL
  Filled 2019-11-27: qty 1

## 2019-11-27 MED ORDER — POTASSIUM CHLORIDE CRYS ER 20 MEQ PO TBCR
40.0000 meq | EXTENDED_RELEASE_TABLET | Freq: Two times a day (BID) | ORAL | Status: DC
  Administered 2019-11-27 (×2): 40 meq via ORAL
  Filled 2019-11-27 (×2): qty 2

## 2019-11-27 MED ORDER — ALUM & MAG HYDROXIDE-SIMETH 200-200-20 MG/5ML PO SUSP
30.0000 mL | ORAL | Status: DC | PRN
  Administered 2019-11-27 – 2019-12-03 (×9): 30 mL via ORAL
  Filled 2019-11-27 (×9): qty 30

## 2019-11-27 MED ORDER — SENNOSIDES-DOCUSATE SODIUM 8.6-50 MG PO TABS
1.0000 | ORAL_TABLET | Freq: Every evening | ORAL | Status: DC | PRN
  Administered 2019-11-27: 2 via ORAL
  Filled 2019-11-27: qty 2

## 2019-11-27 MED ORDER — AMIODARONE LOAD VIA INFUSION
150.0000 mg | Freq: Once | INTRAVENOUS | Status: AC
  Administered 2019-11-27: 150 mg via INTRAVENOUS
  Filled 2019-11-27: qty 83.34

## 2019-11-27 MED ORDER — PANTOPRAZOLE SODIUM 40 MG PO TBEC
40.0000 mg | DELAYED_RELEASE_TABLET | Freq: Every day | ORAL | Status: DC
  Administered 2019-11-27 – 2019-12-03 (×7): 40 mg via ORAL
  Filled 2019-11-27 (×7): qty 1

## 2019-11-27 MED ORDER — AMIODARONE HCL IN DEXTROSE 360-4.14 MG/200ML-% IV SOLN
30.0000 mg/h | INTRAVENOUS | Status: DC
  Administered 2019-11-27 – 2019-11-29 (×4): 30 mg/h via INTRAVENOUS
  Filled 2019-11-27 (×3): qty 200

## 2019-11-27 MED ORDER — SPIRONOLACTONE 25 MG PO TABS
25.0000 mg | ORAL_TABLET | Freq: Every day | ORAL | Status: DC
  Administered 2019-11-27 – 2019-11-28 (×2): 25 mg via ORAL
  Filled 2019-11-27 (×2): qty 1

## 2019-11-27 MED ORDER — MORPHINE SULFATE (PF) 2 MG/ML IV SOLN
1.0000 mg | Freq: Once | INTRAVENOUS | Status: AC
  Administered 2019-11-27: 1 mg via INTRAVENOUS
  Filled 2019-11-27: qty 1

## 2019-11-27 NOTE — Progress Notes (Signed)
[place] for lovenox Indication: atrial fibrillation  Allergies  Allergen Reactions  . Dyflex-G [Dyphylline-Guaifenesin] Hives  . Eliquis [Apixaban] Other (See Comments)    Side effects not a true allergy- he stated that it made him feel bad    Patient Measurements: Height: 5\' 10"  (177.8 cm) Weight: 119.7 kg (263 lb 14.4 oz) IBW/kg (Calculated) : 73  Vital Signs: Temp: 98.2 F (36.8 C) (04/01 0640) Temp Source: Oral (04/01 0640) BP: 110/82 (04/01 1118) Pulse Rate: 110 (04/01 0640)  Labs: Recent Labs    11/26/19 0120 11/26/19 0120 11/26/19 0330 11/26/19 0621 11/26/19 0808 11/27/19 0058  HGB 13.4  --   --   --   --  12.7*  HCT 44.2  --   --   --   --  40.0  PLT 312  --   --   --   --  291  APTT  --   --   --   --   --  170*  LABPROT 22.5*  --   --   --   --   --   INR 2.0*  --   --   --   --   --   HEPARINUNFRC  --   --   --   --   --  >2.20*  CREATININE 3.15*  --   --   --   --  2.53*  TROPONINIHS 22*   < > 21* 18* 16  --    < > = values in this interval not displayed.    Estimated Creatinine Clearance: 35.7 mL/min (A) (by C-G formula based on SCr of 2.53 mg/dL (H)).   Medical History: Past Medical History:  Diagnosis Date  . AICD (automatic cardioverter/defibrillator) present 05/18/2016  . Anginal pain ([place])    occ none recent  . Aortic stenosis   . Arthritis   . CAD (coronary artery disease)   . Cardiomyopathy, ischemic   . CHF (congestive heart failure) ([place])   . Chronic back pain   . Diabetes mellitus without complication ([place])   . Elevated PSA   . History of kidney stones   . Hyperlipidemia   . Morbid obesity ([place])   . Myocardial infarction ([place])   . OSA (obstructive sleep apnea)    does not wear CPAP, -order run out told need to redo  test  . Paroxysmal atrial fibrillation ([place]) 09/29/2018   This was found on event monitor October 2019  . 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 Found to have an AKI. He is on chronic xarelto for afib with his last dose being on 3/30.   Heparin briefly given overnight, levels elevated and dose was adjusted then heparin infusion stopped this morning. To transition to lovenox temporarily. CBC down slightly but stable overnight. No bleeding noted.   Renal function still borderline so will continue with qd dosing.   Goal of Therapy:  Anti-Xa level 0.6-1 units/ml 4hrs after LMWH dose given Monitor platelets by anticoagulation protocol: Yes   Plan:  Lovenox 120mg  SQ Q24H starting this afternoon CBC Q72H  Erin Hearing PharmD., BCPS Clinical Pharmacist 11/27/2019 1:10 PM

## 2019-11-27 NOTE — Progress Notes (Signed)
Patient complaining of constipation.  Per patient last bowel movement was Sunday.  Patient had Miralax earlier and has drank prune juice.  RN text paged [facility].

## 2019-11-27 NOTE — Progress Notes (Signed)
Orthopedic Tech Progress Note Patient Details:  [name] [name] [birth year] [id number]  Ortho Devices Type of Ortho Device: Haematologist Ortho Device/Splint Location: BLE Ortho Device/Splint Interventions: Ordered, Application   Post Interventions Patient Tolerated: Well Instructions Provided: Care of device, Adjustment of device   [name] [name] 11/27/2019, 10:59 AM

## 2019-11-27 NOTE — TOC Initial Note (Signed)
Transition of Care [facility]) - Initial/Assessment Note    Patient Details  Name: [name] [name] MRN: [id number] Date of Birth: 1951-08-28  [facility]) CM/SW Contact:    Bethena Roys, RN Phone Number: 11/27/2019, 4:07 PM  Clinical Narrative:  Patient presented for chest pain and shortness of breath. Prior to arrival patient was from home with the support of spouse. Patient gets his medications from [place]. Patient has durable medical equipment [place], RW, and 2 crutches in the home. Case Manager received referral for [place] Failure Screen. Case Manager discussed home needs with patient- spouse at the bedside. Patient unable to continue conversation- due to pain- patient felt like he was constipated. Case Manager asked staff RN to reach out to physician to see if anything could be ordered. Case Manager to check back in with patient 11-28-19 to see if he needs any home health services.                 Expected Discharge Plan: [place] Barriers to Discharge: Continued Medical Work up   Patient Goals and CMS Choice Patient states their goals for this hospitalization and ongoing recovery are:: "to return home"     Expected Discharge Plan and Services Expected Discharge Plan: [place] In-house Referral: NA Discharge Planning Services: CM Consult   Living arrangements for the past 2 months: Single Family Home                 DME Arranged: N/A     Prior Living Arrangements/Services Living arrangements for the past 2 months: Single Family Home Lives with:: Spouse Patient language and need for interpreter reviewed:: Yes Do you feel safe going back to the place where you live?: Yes      Need for Family Participation in Patient Care: Yes (Comment) Care giver support system in place?: Yes (comment) Current home services: DME(Patient has cane, RW and 2 crutches.) Criminal Activity/Legal Involvement Pertinent to Current  Situation/Hospitalization: No - Comment as needed  Activities of Daily Living Home Assistive Devices/Equipment: Walker (specify type) ADL Screening (condition at time of admission) Patient's cognitive ability adequate to safely complete daily activities?: Yes Is the patient deaf or have difficulty hearing?: No Does the patient have difficulty seeing, even when wearing glasses/contacts?: No Does the patient have difficulty concentrating, remembering, or making decisions?: No Patient able to express need for assistance with ADLs?: Yes Does the patient have difficulty dressing or bathing?: No Independently performs ADLs?: Yes (appropriate for developmental age) Does the patient have difficulty walking or climbing stairs?: Yes Weakness of Legs: Both Weakness of Arms/Hands: None  Permission Sought/Granted Permission sought to share information with : Family Supports, Chartered certified accountant granted to share information with : Yes, Verbal Permission Granted   Emotional Assessment Appearance:: Appears stated age Attitude/Demeanor/Rapport: Unable to Assess Affect (typically observed): Anxious Orientation: : Oriented to Self, Oriented to Place, Oriented to  Time, Oriented to Situation Alcohol / Substance Use: Not Applicable Psych Involvement: No (comment)  Admission diagnosis:  Unstable angina (HCC) [I20.0] Acute exacerbation of CHF (congestive heart failure) (HCC) [I50.9] Nonspecific chest pain [R07.9] Patient Active Problem List   Diagnosis Date Noted  . Acute exacerbation of CHF (congestive heart failure) ([place]) 11/26/2019  . History of gastric ulcer 10/10/2019  . Colon cancer screening 10/10/2019  . Esophagitis 10/10/2019  . Dehydration   . Ventricular tachycardia ([place]) 08/25/2019  . VT (ventricular tachycardia) ([place]) 08/25/2019  . IDA (iron deficiency  anemia) 11/13/2018  . CKD stage 2 due to type 2 diabetes mellitus ([place])   . Acute gastric ulcer with hemorrhage    . Acute upper GI bleed 10/02/2018  . GI bleed 10/01/2018  . Acute blood loss anemia   . PAF (paroxysmal atrial fibrillation) ([place]) 09/29/2018  . 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 Aortic stenosis   . S/P nasal septoplasty 04/25/2017  . AICD (automatic cardioverter/defibrillator) present 11/22/2016  . CKD (chronic kidney disease) stage 3, GFR 30-59 ml/min   . Acute kidney injury ([place])   . Difficulty in walking, not elsewhere classified   . Pain in the abdomen   . Anasarca   . Constipation   . Dyspnea 09/11/2016  . Prolonged Q-T interval on ECG 09/11/2016  . Hypokalemia 09/11/2016  . Chest pain 10/22/2015  . Type 2 diabetes with nephropathy ([place]) 10/22/2015  . Essential hypertension   . Chronic lumbar pain 01/01/2015  . Obstructive sleep apnea 10/02/2014  . Cardiomyopathy, ischemic 06/25/2014  . Chronic combined systolic and diastolic CHF (congestive heart failure) ([place]) 10/29/2013  . Right ankle pain 08/14/2013  . Hyperlipemia 05/14/2013  . CAD (coronary artery disease) 05/14/2013  . Class 2 severe obesity due to excess calories with serious comorbidity and body mass index (BMI) of 39.0 to 39.9 in adult ([place]) 05/14/2013  . Elevated PSA 05/14/2013  . S/P CABG x 6 12/23/1999   PCP:  Kathyrn Drown, MD Pharmacy:   [facility] [name] [address] Phone: [phone] Fax: [phone]  [place], [place] [address] [place] [address] Phone: [phone] Fax: [phone]   Readmission Risk Interventions Readmission Risk Prevention Plan 11/27/2019  Transportation Screening Complete  PCP or Specialist Appt within 3-5 Days Complete  HRI or Home Care Consult Complete  Social Work Consult for [place] Planning/Counseling Complete  Palliative Care Screening Not Applicable  Medication Review Press photographer) Complete  Some recent  data might be hidden

## 2019-11-27 NOTE — Progress Notes (Addendum)
PROGRESS NOTE                                                                                                                                                                                                             Patient Demographics:    [name] [name], is a 69 y.o. male, DOB - 24-Jun-1951, KGU:[id number]  Admit date - 11/26/2019   Admitting Physician Chauncey Mann, MD  Outpatient Primary MD for the patient is Kathyrn Drown, MD  LOS - 1  Chief Complaint  Patient presents with  . Chest Pain       Brief Narrative  69 year old Caucasian male who was admitted few hours ago with symptoms of CHF with known past medical history of ischemic cardiomyopathy with chronic systolic heart failure EF of 15%, s/p CABG, aortic stenosis s/p TAVR, paroxysmal A. fib [place] vas 2 score of greater than 3, hypertension, dyslipidemia, morbid obesity, type 2 diabetes, CKD 3 who presented to the hospital with chief complaints of chest pain and shortness of breath along with leg edema.   Subjective:    [name] [name] today has, No headache, No chest pain, No abdominal pain - No Nausea, No new weakness tingling or numbness, No Cough - SOB.     Assessment  & Plan :     1.  Acute on chronic systolic heart failure in a patient with EF less than 20% s/p TAVR for AS with reduced right-sided RV systolic function has AICD.  Patient has combination of both left and right-sided heart failure (this admission appears to be predominantly right-sided heart failure in nature due to more peripheral edema than lung involvement) , has massive leg edema and relatively spared lungs, he is responding well to IV Lasix, Zaroxolyn and Aldactone combination which will be continued.  Monitor intake output, daily weights, cardiology on board.  Blood pressure too low to tolerate beta-blocker or any other blood pressure medication at this time, renal failure prevents the use of ACE/ARB or Entresto etc.  2.  Hypokalemia.   Replaced  3.  Paroxysmal atrial fibrillation.  Mali vas 2 score of at least 4.  Was in RVR with hypotension morning of 11/27/2019, blood pressure too low to tolerate beta-blocker calcium channel blockers, placed on IV amiodarone drip, for anticoagulation Lovenox for now due to unstable renal function.  4.  AKI on CKD 4 - improving with diuresis monitor.  Baseline creatinine close to 2  5.  History of AS and TAVR procedure.  Supportive  care.  6.  CAD with underlying ischemic cardiomyopathy s/p CABG.  No acute issues continue combination of aspirin, Imdur and statin for secondary prevention.  Once blood pressure improves low-dose beta-blocker if tolerated.  7. OSA - ? Patient refused outpt Study, undiagnosed.  Counseled patient advised to get another sleep study.  8. DM2 - on Lantus + ISS  Lab Results  Component Value Date   HGBA1C 7.2 (H) 11/26/2019    CBG (last 3)  Recent Labs    11/26/19 1630 11/26/19 2109 11/27/19 0807  GLUCAP 118* 128* 78      Family Communication  : Wife Karen on 11/27/2019 [phone]    Code Status :  Full  Disposition Plan  : Stay in the hospital for treatment of severe CHF exacerbation along with A. fib RVR  Consults  : Cardiology  Procedures  :    Echocardiogram -   1. Left ventricular ejection fraction, by estimation, is <20%. The left ventricle has severely decreased function. The left ventricle demonstrates  global hypokinesis. The left ventricular internal cavity size was moderately to severely dilated. Left ventricular diastolic function could not be evaluated.  2. Right ventricular systolic function is moderately reduced. The right ventricular size is moderately enlarged. There is moderately elevated  pulmonary artery systolic pressure.  3. Left atrial size was moderately dilated.  4. The mitral valve is normal in structure. Mild mitral valve regurgitation. No evidence of mitral stenosis.  5. The aortic valve has been  repaired/replaced. Aortic valve regurgitation is not visualized. There is a 29 mm Edwards Sapien prosthetic (TAVR) valve present in the aortic position. Procedure Date: 01/04/2018.  6. Mild pulmonic stenosis.  7. The inferior vena cava is dilated in size with <50% respiratory variability, suggesting right atrial pressure of 15 mmHg.   DVT Prophylaxis  :  Lovenox   Lab Results  Component Value Date   PLT 291 11/27/2019    Diet :  Diet Order            Diet 2 gram sodium Room service appropriate? Yes; Fluid consistency: Thin; Fluid restriction: 1200 mL Fluid  Diet effective now               Inpatient Medications Scheduled Meds: . acetaminophen  500 mg Oral BID  . aspirin EC  81 mg Oral Daily  . furosemide  80 mg Intravenous BID  . insulin aspart  0-9 Units Subcutaneous TID WC  . insulin aspart  3 Units Subcutaneous TID WC  . insulin detemir  40 Units Subcutaneous QHS  . isosorbide dinitrate  30 mg Oral Daily  . metolazone  2.5 mg Oral Once  . pantoprazole  40 mg Oral Daily  . potassium chloride  40 mEq Oral BID  . rosuvastatin  40 mg Oral Daily  . spironolactone  25 mg Oral Daily  . tamsulosin  0.4 mg Oral Daily   Continuous Infusions: . amiodarone 60 mg/hr (11/27/19 1116)   Followed by  . amiodarone     PRN Meds:.metoprolol tartrate, nitroGLYCERIN  Antibiotics  :   Anti-infectives (From admission, onward)   None          Objective:   Vitals:   11/26/19 2111 11/27/19 0640 11/27/19 0832 11/27/19 1118  BP: 105/72 (!) 86/57 112/80 110/82  Pulse: (!) 119 (!) 110    Resp: 20 19    Temp: 97.6 F (36.4 C) 98.2 F (36.8 C)    TempSrc: Oral Oral    SpO2: 99%  94%    Weight:  119.7 kg    Height:        SpO2: 94 %  Wt Readings from Last 3 Encounters:  11/27/19 119.7 kg  10/24/19 118.8 kg  10/17/19 119.3 kg     Intake/Output Summary (Last 24 hours) at 11/27/2019 1203 Last data filed at 11/27/2019 1115 Gross per 24 hour  Intake 699.7 ml  Output 3230 ml    Net -2530.3 ml     Physical Exam  Awake Alert, No new F.N deficits, Normal affect [place].AT,PERRAL Supple Neck,No JVD, No cervical lymphadenopathy appriciated.  Symmetrical Chest wall movement, Good air movement bilaterally, CTAB IRRR, No Gallops,Rubs, positive aortic systolic murmur, No Parasternal Heave +ve B.Sounds, Abd Soft, No tenderness, No organomegaly appriciated, No rebound - guarding or rigidity. No Cyanosis, Clubbing , 2+ leg edema, No new Rash or bruise       Data Review:    Recent Labs  Lab 11/26/19 0120 11/27/19 0058  WBC 7.2 6.4  HGB 13.4 12.7*  HCT 44.2 40.0  PLT 312 291  MCV 99.8 95.9  MCH 30.2 30.5  MCHC 30.3 31.8  RDW 16.8* 16.7*    Recent Labs  Lab 11/26/19 0120 11/26/19 0450 11/26/19 0605 11/27/19 0058  NA 133*  --   --  135  K 4.7  --   --  3.2*  CL 81*  --   --  84*  CO2 42*  --   --  35*  GLUCOSE 149*  --   --  174*  BUN 53*  --   --  53*  CREATININE 3.15*  --   --  2.53*  CALCIUM 8.7*  --   --  8.6*  AST  --   --   --  20  ALT  --   --   --  12  ALKPHOS  --   --   --  110  BILITOT  --   --   --  1.7*  ALBUMIN  --   --   --  2.9*  MG  --   --   --  2.6*  INR 2.0*  --   --   --   HGBA1C  --   --  7.2*  --   BNP  --  882.5*  --  1,036.2*    Recent Labs  Lab 11/26/19 0450 11/26/19 0730 11/27/19 0058  BNP 882.5*  --  1,036.2*  SARSCOV2NAA  --  NEGATIVE  --     ------------------------------------------------------------------------------------------------------------------ No results for input(s): CHOL, HDL, LDLCALC, TRIG, CHOLHDL, LDLDIRECT in the last 72 hours.  Lab Results  Component Value Date   HGBA1C 7.2 (H) 11/26/2019   ------------------------------------------------------------------------------------------------------------------ No results for input(s): TSH, T4TOTAL, T3FREE, THYROIDAB in the last 72 hours.  Invalid input(s):  FREET3 ------------------------------------------------------------------------------------------------------------------ No results for input(s): VITAMINB12, FOLATE, FERRITIN, TIBC, IRON, RETICCTPCT in the last 72 hours.  Coagulation profile Recent Labs  Lab 11/26/19 0120  INR 2.0*    No results for input(s): DDIMER in the last 72 hours.  Cardiac Enzymes No results for input(s): CKMB, TROPONINI, MYOGLOBIN in the last 168 hours.  Invalid input(s): CK ------------------------------------------------------------------------------------------------------------------    Component Value Date/Time   BNP 1,036.2 (H) 11/27/2019 4709    Micro Results Recent Results (from the past 240 hour(s))  SARS CORONAVIRUS 2 (TAT 6-24 HRS) Nasopharyngeal Nasopharyngeal Swab     Status: None   Collection Time: 11/26/19  7:30 AM   Specimen: Nasopharyngeal Swab  Result Value Ref  Range Status   SARS Coronavirus 2 NEGATIVE NEGATIVE Final    Comment: (NOTE) SARS-CoV-2 target nucleic acids are NOT DETECTED. The SARS-CoV-2 RNA is generally detectable in upper and lower respiratory specimens during the acute phase of infection. Negative results do not preclude SARS-CoV-2 infection, do not rule out co-infections with other pathogens, and should not be used as the sole basis for treatment or other patient management decisions. Negative results must be combined with clinical observations, patient history, and epidemiological information. The expected result is Negative. Fact Sheet for Patients: SugarRoll.be Fact Sheet for Healthcare Providers: https://www.woods-mathews.com/ This test is not yet approved or cleared by the [place] FDA and  has been authorized for detection and/or diagnosis of SARS-CoV-2 by FDA under an Emergency Use Authorization (EUA). This EUA will remain  in effect (meaning this test can be used) for the duration of the COVID-19  declaration under Section 56 4(b)(1) of the Act, 21 U.S.C. section 360bbb-3(b)(1), unless the authorization is terminated or revoked sooner. Performed at [facility], [place] [address], [place], [place] [address]     Radiology Reports DG Chest 2 View  Result Date: 11/26/2019 CLINICAL DATA:  Chest pain and shortness of breath EXAM: CHEST - 2 VIEW COMPARISON:  08/25/2019 FINDINGS: Small pleural effusions. Moderate cardiomegaly. Aortic valve replacement. Unchanged position of left chest wall AICD single lead. No focal airspace consolidation or pulmonary edema. IMPRESSION: Cardiomegaly and small bilateral pleural effusions. Electronically Signed   By: Ulyses Jarred M.D.   On: 11/26/2019 01:58   ECHOCARDIOGRAM COMPLETE  Result Date: 11/26/2019    ECHOCARDIOGRAM REPORT   Patient Name:   [name] [name] Date of Exam: 11/26/2019 Medical Rec #:  [id number]       Height:       70.0 in Accession #:    [id number]      Weight:       275.6 lb Date of Birth:  07-03-1951       BSA:          2.392 m Patient Age:    62 years        BP:           107/75 mmHg Patient Gender: M               HR:           113 bpm. Exam Location:  Inpatient Procedure: 2D Echo and Intracardiac Opacification Agent Indications:    CHF-Acute Systolic 194.17 / E08.14  History:        Patient has prior history of Echocardiogram examinations, most                 recent 03/04/2019. CAD and Previous Myocardial Infarction, Prior                 CABG and Defibrillator, Arrythmias:Atrial Fibrillation; Risk                 Factors:Dyslipidemia, Diabetes and Sleep Apnea. Ischemic                 cardiomyopathy.                 Aortic Valve: 29 mm Edwards Sapien prosthetic, stented (TAVR)                 valve is present in the aortic position. Procedure Date:                 01/04/2018.  Sonographer:  Darlina Sicilian RDCS Referring Phys: [id number] [place]  1. Left ventricular ejection fraction, by estimation, is <20%. The left  ventricle has severely decreased function. The left ventricle demonstrates global hypokinesis. The left ventricular internal cavity size was moderately to severely dilated. Left ventricular diastolic function could not be evaluated.  2. Right ventricular systolic function is moderately reduced. The right ventricular size is moderately enlarged. There is moderately elevated pulmonary artery systolic pressure.  3. Left atrial size was moderately dilated.  4. The mitral valve is normal in structure. Mild mitral valve regurgitation. No evidence of mitral stenosis.  5. The aortic valve has been repaired/replaced. Aortic valve regurgitation is not visualized. There is a 29 mm Edwards Sapien prosthetic (TAVR) valve present in the aortic position. Procedure Date: 01/04/2018.  6. Mild pulmonic stenosis.  7. The inferior vena cava is dilated in size with <50% respiratory variability, suggesting right atrial pressure of 15 mmHg. Comparison(s): No significant change from prior study. FINDINGS  Left Ventricle: Left ventricular ejection fraction, by estimation, is <20%. The left ventricle has severely decreased function. The left ventricle demonstrates global hypokinesis. Definity contrast agent was given IV to delineate the left ventricular endocardial borders. The left ventricular internal cavity size was moderately to severely dilated. There is no left ventricular hypertrophy. Left ventricular diastolic function could not be evaluated. Right Ventricle: The right ventricular size is moderately enlarged. No increase in right ventricular wall thickness. Right ventricular systolic function is moderately reduced. There is moderately elevated pulmonary artery systolic pressure. The tricuspid  regurgitant velocity is 3.02 m/s, and with an assumed right atrial pressure of 15 mmHg, the estimated right ventricular systolic pressure is 63.8 mmHg. Left Atrium: Left atrial size was moderately dilated. Right Atrium: Right atrial size was  not well visualized. Pericardium: There is no evidence of pericardial effusion. Mitral Valve: The mitral valve is normal in structure. Mild mitral annular calcification. Mild mitral valve regurgitation. No evidence of mitral valve stenosis. Tricuspid Valve: The tricuspid valve is normal in structure. Tricuspid valve regurgitation is mild . No evidence of tricuspid stenosis. Aortic Valve: The aortic valve has been repaired/replaced. Aortic valve regurgitation is not visualized. Aortic valve mean gradient measures 4.0 mmHg. Aortic valve peak gradient measures 7.7 mmHg. Aortic valve area, by VTI measures 3.69 cm. There is a 29 mm Edwards Sapien prosthetic, stented (TAVR) valve present in the aortic position. Procedure Date: 01/04/2018. Pulmonic Valve: The pulmonic valve was grossly normal. Pulmonic valve regurgitation is mild to moderate. Mild pulmonic stenosis. Aorta: The aortic root and ascending aorta are structurally normal, with no evidence of dilitation. Venous: The inferior vena cava is dilated in size with less than 50% respiratory variability, suggesting right atrial pressure of 15 mmHg. IAS/Shunts: No atrial level shunt detected by color flow Doppler. Additional Comments: A pacer wire is visualized in the right ventricle and right atrium.  LEFT VENTRICLE PLAX 2D LVIDd:         6.29 cm LVIDs:         5.56 cm LV PW:         1.02 cm LV IVS:        1.05 cm LVOT diam:     2.70 cm LV SV:         85 LV SV Index:   36 LVOT Area:     5.73 cm  LV Volumes (MOD) LV vol d, MOD A2C: 341.0 ml LV vol d, MOD A4C: 256.0 ml LV vol s, MOD A2C: 270.0 ml  LV vol s, MOD A4C: 206.0 ml LV SV MOD A2C:     71.0 ml LV SV MOD A4C:     256.0 ml LV SV MOD BP:      62.7 ml RIGHT VENTRICLE TAPSE (M-mode): 1.5 cm LEFT ATRIUM              Index LA diam:        5.50 cm  2.30 cm/m LA Vol (A2C):   110.0 ml 45.99 ml/m LA Vol (A4C):   135.0 ml 56.44 ml/m LA Biplane Vol: 127.0 ml 53.09 ml/m  AORTIC VALVE AV Area (Vmax):    4.34 cm AV Area  (Vmean):   4.36 cm AV Area (VTI):     3.69 cm AV Vmax:           138.50 cm/s AV Vmean:          94.050 cm/s AV VTI:            0.231 m AV Peak Grad:      7.7 mmHg AV Mean Grad:      4.0 mmHg LVOT Vmax:         105.00 cm/s LVOT Vmean:        71.600 cm/s LVOT VTI:          0.149 m LVOT/AV VTI ratio: 0.65  AORTA Ao Root diam: 3.20 cm MITRAL VALVE               TRICUSPID VALVE MV Area (PHT): 6.43 cm    TR Peak grad:   36.5 mmHg MV Decel Time: 118 msec    TR Vmax:        302.00 cm/s MV E velocity: 99.15 cm/s                            SHUNTS                            Systemic VTI:  0.15 m                            Systemic Diam: 2.70 cm Buford Dresser MD Electronically signed by Buford Dresser MD Signature Date/Time: 11/26/2019/4:53:33 PM    Final     Time Spent in minutes  30   Lala Lund M.D on 11/27/2019 at 12:03 PM  To page go to [email or web address] - password [facility]

## 2019-11-27 NOTE — Progress Notes (Signed)
Progress Note  Patient Name: [name] [name] Date of Encounter: 11/27/2019  Primary Cardiologist: Carlyle Dolly, MD    Subjective   69 yo with hx of CHF and AKI  He has diuresed 2 liters so far this admission.  Creatinine has improved.    Inpatient Medications    Scheduled Meds: . acetaminophen  500 mg Oral BID  . amiodarone  150 mg Intravenous Once  . aspirin EC  81 mg Oral Daily  . furosemide  80 mg Intravenous BID  . insulin aspart  0-9 Units Subcutaneous TID WC  . insulin aspart  3 Units Subcutaneous TID WC  . insulin detemir  40 Units Subcutaneous QHS  . isosorbide dinitrate  30 mg Oral Daily  . metolazone  2.5 mg Oral Once  . pantoprazole  40 mg Oral Daily  . potassium chloride  40 mEq Oral BID  . rosuvastatin  40 mg Oral Daily  . spironolactone  25 mg Oral Daily  . tamsulosin  0.4 mg Oral Daily   Continuous Infusions: . amiodarone     Followed by  . amiodarone     PRN Meds: metoprolol tartrate, nitroGLYCERIN   Vital Signs    Vitals:   11/26/19 1712 11/26/19 2111 11/27/19 0640 11/27/19 0832  BP: 92/75 105/72 (!) 86/57 112/80  Pulse:  (!) 119 (!) 110   Resp:  20 19   Temp:  97.6 F (36.4 C) 98.2 F (36.8 C)   TempSrc:  Oral Oral   SpO2:  99% 94%   Weight:   119.7 kg   Height:        Intake/Output Summary (Last 24 hours) at 11/27/2019 1043 Last data filed at 11/27/2019 0700 Gross per 24 hour  Intake 477.7 ml  Output 2130 ml  Net -1652.3 ml   Last 3 Weights 11/27/2019 11/26/2019 11/26/2019  Weight (lbs) 263 lb 14.4 oz 268 lb 12.8 oz 275 lb 9.2 oz  Weight (kg) 119.704 kg 121.927 kg 125 kg      Telemetry    aifb with rvr  - Personally Reviewed  ECG     - Personally Reviewed  Physical Exam   GEN:  elderly male, moderately obese    Neck: No JVD Cardiac:  irreg. Irreg.  Respiratory: Clear to auscultation bilaterally. GI: less edematous  MS:  less edema  Neuro:  Nonfocal  Psych: Normal affect   Labs    High Sensitivity Troponin:     Recent Labs  Lab 11/26/19 0120 11/26/19 0330 11/26/19 0621 11/26/19 0808  TROPONINIHS 22* 21* 18* 16      Chemistry Recent Labs  Lab 11/26/19 0120 11/27/19 0058  NA 133* 135  K 4.7 3.2*  CL 81* 84*  CO2 42* 35*  GLUCOSE 149* 174*  BUN 53* 53*  CREATININE 3.15* 2.53*  CALCIUM 8.7* 8.6*  PROT  --  6.9  ALBUMIN  --  2.9*  AST  --  20  ALT  --  12  ALKPHOS  --  110  BILITOT  --  1.7*  GFRNONAA 19* 25*  GFRAA 22* 29*  ANIONGAP 10 16*     Hematology Recent Labs  Lab 11/26/19 0120 11/27/19 0058  WBC 7.2 6.4  RBC 4.43 4.17*  HGB 13.4 12.7*  HCT 44.2 40.0  MCV 99.8 95.9  MCH 30.2 30.5  MCHC 30.3 31.8  RDW 16.8* 16.7*  PLT 312 291    BNP Recent Labs  Lab 11/26/19 0450 11/27/19 0058  BNP 882.5* 1,036.2*  DDimer No results for input(s): DDIMER in the last 168 hours.   Radiology    DG Chest 2 View  Result Date: 11/26/2019 CLINICAL DATA:  Chest pain and shortness of breath EXAM: CHEST - 2 VIEW COMPARISON:  08/25/2019 FINDINGS: Small pleural effusions. Moderate cardiomegaly. Aortic valve replacement. Unchanged position of left chest wall AICD single lead. No focal airspace consolidation or pulmonary edema. IMPRESSION: Cardiomegaly and small bilateral pleural effusions. Electronically Signed   By: Ulyses Jarred M.D.   On: 11/26/2019 01:58   ECHOCARDIOGRAM COMPLETE  Result Date: 11/26/2019    ECHOCARDIOGRAM REPORT   Patient Name:   [name] [name] Date of Exam: 11/26/2019 Medical Rec #:  [id number]       Height:       70.0 in Accession #:    [id number]      Weight:       275.6 lb Date of Birth:  09/16/1950       BSA:          2.392 m Patient Age:    1 years        BP:           107/75 mmHg Patient Gender: M               HR:           113 bpm. Exam Location:  Inpatient Procedure: 2D Echo and Intracardiac Opacification Agent Indications:    CHF-Acute Systolic 993.71 / I96.78  History:        Patient has prior history of Echocardiogram examinations, most                  recent 03/04/2019. CAD and Previous Myocardial Infarction, Prior                 CABG and Defibrillator, Arrythmias:Atrial Fibrillation; Risk                 Factors:Dyslipidemia, Diabetes and Sleep Apnea. Ischemic                 cardiomyopathy.                 Aortic Valve: 29 mm Edwards Sapien prosthetic, stented (TAVR)                 valve is present in the aortic position. Procedure Date:                 01/04/2018.  Sonographer:    Darlina Sicilian RDCS Referring Phys: [id number] [place]  1. Left ventricular ejection fraction, by estimation, is <20%. The left ventricle has severely decreased function. The left ventricle demonstrates global hypokinesis. The left ventricular internal cavity size was moderately to severely dilated. Left ventricular diastolic function could not be evaluated.  2. Right ventricular systolic function is moderately reduced. The right ventricular size is moderately enlarged. There is moderately elevated pulmonary artery systolic pressure.  3. Left atrial size was moderately dilated.  4. The mitral valve is normal in structure. Mild mitral valve regurgitation. No evidence of mitral stenosis.  5. The aortic valve has been repaired/replaced. Aortic valve regurgitation is not visualized. There is a 29 mm Edwards Sapien prosthetic (TAVR) valve present in the aortic position. Procedure Date: 01/04/2018.  6. Mild pulmonic stenosis.  7. The inferior vena cava is dilated in size with <50% respiratory variability, suggesting right atrial pressure of 15 mmHg. Comparison(s): No significant change from prior study. FINDINGS  Left Ventricle:  Left ventricular ejection fraction, by estimation, is <20%. The left ventricle has severely decreased function. The left ventricle demonstrates global hypokinesis. Definity contrast agent was given IV to delineate the left ventricular endocardial borders. The left ventricular internal cavity size was moderately to severely dilated. There is no  left ventricular hypertrophy. Left ventricular diastolic function could not be evaluated. Right Ventricle: The right ventricular size is moderately enlarged. No increase in right ventricular wall thickness. Right ventricular systolic function is moderately reduced. There is moderately elevated pulmonary artery systolic pressure. The tricuspid  regurgitant velocity is 3.02 m/s, and with an assumed right atrial pressure of 15 mmHg, the estimated right ventricular systolic pressure is 59.5 mmHg. Left Atrium: Left atrial size was moderately dilated. Right Atrium: Right atrial size was not well visualized. Pericardium: There is no evidence of pericardial effusion. Mitral Valve: The mitral valve is normal in structure. Mild mitral annular calcification. Mild mitral valve regurgitation. No evidence of mitral valve stenosis. Tricuspid Valve: The tricuspid valve is normal in structure. Tricuspid valve regurgitation is mild . No evidence of tricuspid stenosis. Aortic Valve: The aortic valve has been repaired/replaced. Aortic valve regurgitation is not visualized. Aortic valve mean gradient measures 4.0 mmHg. Aortic valve peak gradient measures 7.7 mmHg. Aortic valve area, by VTI measures 3.69 cm. There is a 29 mm Edwards Sapien prosthetic, stented (TAVR) valve present in the aortic position. Procedure Date: 01/04/2018. Pulmonic Valve: The pulmonic valve was grossly normal. Pulmonic valve regurgitation is mild to moderate. Mild pulmonic stenosis. Aorta: The aortic root and ascending aorta are structurally normal, with no evidence of dilitation. Venous: The inferior vena cava is dilated in size with less than 50% respiratory variability, suggesting right atrial pressure of 15 mmHg. IAS/Shunts: No atrial level shunt detected by color flow Doppler. Additional Comments: A pacer wire is visualized in the right ventricle and right atrium.  LEFT VENTRICLE PLAX 2D LVIDd:         6.29 cm LVIDs:         5.56 cm LV PW:         1.02 cm  LV IVS:        1.05 cm LVOT diam:     2.70 cm LV SV:         85 LV SV Index:   36 LVOT Area:     5.73 cm  LV Volumes (MOD) LV vol d, MOD A2C: 341.0 ml LV vol d, MOD A4C: 256.0 ml LV vol s, MOD A2C: 270.0 ml LV vol s, MOD A4C: 206.0 ml LV SV MOD A2C:     71.0 ml LV SV MOD A4C:     256.0 ml LV SV MOD BP:      62.7 ml RIGHT VENTRICLE TAPSE (M-mode): 1.5 cm LEFT ATRIUM              Index LA diam:        5.50 cm  2.30 cm/m LA Vol (A2C):   110.0 ml 45.99 ml/m LA Vol (A4C):   135.0 ml 56.44 ml/m LA Biplane Vol: 127.0 ml 53.09 ml/m  AORTIC VALVE AV Area (Vmax):    4.34 cm AV Area (Vmean):   4.36 cm AV Area (VTI):     3.69 cm AV Vmax:           138.50 cm/s AV Vmean:          94.050 cm/s AV VTI:            0.231 m AV Peak Grad:  7.7 mmHg AV Mean Grad:      4.0 mmHg LVOT Vmax:         105.00 cm/s LVOT Vmean:        71.600 cm/s LVOT VTI:          0.149 m LVOT/AV VTI ratio: 0.65  AORTA Ao Root diam: 3.20 cm MITRAL VALVE               TRICUSPID VALVE MV Area (PHT): 6.43 cm    TR Peak grad:   36.5 mmHg MV Decel Time: 118 msec    TR Vmax:        302.00 cm/s MV E velocity: 99.15 cm/s                            SHUNTS                            Systemic VTI:  0.15 m                            Systemic Diam: 2.70 cm Buford Dresser MD Electronically signed by Buford Dresser MD Signature Date/Time: 11/26/2019/4:53:33 PM    Final     Cardiac Studies     Patient Profile     69 y.o. male   Assessment & Plan    1.  Acute on chronic congestive heart failure: He is responding very well to IV Lasix.  Is curious that his kidneys were no longer responding to torsemide and metolazone.  Fortunately, the IV Lasix is working well.  He has his legs up and his leg edema already looks quite a bit better.  I have instructed him to elevate his legs on a regular basis.  Continue current medications.  2.  Acute on chronic kidney disease: Creatinine is improving with diuresis  3.  Status post TAVR: The echo  showed that his TAVR still working well.  4.  Hyperlipidemia: Continue Crestor.      For questions or updates, please contact [place] Please consult [email or web address] for contact info under        Signed, Mertie Moores, MD  11/27/2019, 10:43 AM

## 2019-11-27 NOTE — Progress Notes (Signed)
ANTICOAGULATION CONSULT NOTE - Follow Up Consult  Pharmacy Consult for heparin Indication: atrial fibrillation  Labs: Recent Labs    11/26/19 0120 11/26/19 0120 11/26/19 0330 11/26/19 0621 11/26/19 0808 11/27/19 0058  HGB 13.4  --   --   --   --  12.7*  HCT 44.2  --   --   --   --  40.0  PLT 312  --   --   --   --  291  APTT  --   --   --   --   --  170*  LABPROT 22.5*  --   --   --   --   --   INR 2.0*  --   --   --   --   --   HEPARINUNFRC  --   --   --   --   --  >2.20*  CREATININE 3.15*  --   --   --   --  2.53*  TROPONINIHS 22*   < > 21* 18* 16  --    < > = values in this interval not displayed.    Assessment: 69yo male supratherapeutic on heparin with initial dosing while Eliquis on hold; no gtt issues or signs of bleeding per RN.  Goal of Therapy:  aPTT 66-102 seconds   Plan:  Will hold heparin gtt x1h then decrease heparin gtt by 4 units/kgABW/hr to 1000 units/hr and check PTT in 8 hours.    Wynona Neat, PharmD, BCPS  11/27/2019,2:15 AM

## 2019-11-27 NOTE — Progress Notes (Signed)
ReDS Clip Diuretic Study Pt study # [id number]  Your patient is in the Blinded arm of the ReDS Clip Diuretic study.  Your patient has had a ReDS reading and the reading has been transmitted to the cloud.     Thank You   The Research Team  Antonietta Jewel, PharmD, BCCCP Clinical Pharmacist  Phone: [phone]  Please check AMION for all [place] phone numbers After 10:00 PM, call [place] [phone]

## 2019-11-28 ENCOUNTER — Other Ambulatory Visit: Payer: Self-pay | Admitting: Cardiovascular Disease

## 2019-11-28 DIAGNOSIS — Z952 Presence of prosthetic heart valve: Secondary | ICD-10-CM

## 2019-11-28 DIAGNOSIS — I10 Essential (primary) hypertension: Secondary | ICD-10-CM

## 2019-11-28 LAB — CBC
HCT: 44.5 % (ref 39.0–52.0)
Hemoglobin: 13.9 g/dL (ref 13.0–17.0)
MCH: 30 pg (ref 26.0–34.0)
MCHC: 31.2 g/dL (ref 30.0–36.0)
MCV: 96.1 fL (ref 80.0–100.0)
Platelets: 330 10*3/uL (ref 150–400)
RBC: 4.63 MIL/uL (ref 4.22–5.81)
RDW: 16.9 % — ABNORMAL HIGH (ref 11.5–15.5)
WBC: 8 10*3/uL (ref 4.0–10.5)
nRBC: 0 % (ref 0.0–0.2)

## 2019-11-28 LAB — COMPREHENSIVE METABOLIC PANEL
ALT: 14 U/L (ref 0–44)
AST: 23 U/L (ref 15–41)
Albumin: 3 g/dL — ABNORMAL LOW (ref 3.5–5.0)
Alkaline Phosphatase: 117 U/L (ref 38–126)
Anion gap: 18 — ABNORMAL HIGH (ref 5–15)
BUN: 53 mg/dL — ABNORMAL HIGH (ref 8–23)
CO2: 32 mmol/L (ref 22–32)
Calcium: 8.9 mg/dL (ref 8.9–10.3)
Chloride: 83 mmol/L — ABNORMAL LOW (ref 98–111)
Creatinine, Ser: 2.52 mg/dL — ABNORMAL HIGH (ref 0.61–1.24)
GFR calc Af Amer: 29 mL/min — ABNORMAL LOW (ref >60)
GFR calc non Af Amer: 25 mL/min — ABNORMAL LOW (ref >60)
Glucose, Bld: 99 mg/dL (ref 70–99)
Potassium: 3.2 mmol/L — ABNORMAL LOW (ref 3.5–5.1)
Sodium: 133 mmol/L — ABNORMAL LOW (ref 135–145)
Total Bilirubin: 1.3 mg/dL — ABNORMAL HIGH (ref 0.3–1.2)
Total Protein: 7.4 g/dL (ref 6.5–8.1)

## 2019-11-28 LAB — BRAIN NATRIURETIC PEPTIDE: B Natriuretic Peptide: 1167.5 pg/mL — ABNORMAL HIGH (ref 0.0–100.0)

## 2019-11-28 LAB — TROPONIN I (HIGH SENSITIVITY): Troponin I (High Sensitivity): 16 ng/L (ref <18)

## 2019-11-28 LAB — GLUCOSE, CAPILLARY
Glucose-Capillary: 89 mg/dL (ref 70–99)
Glucose-Capillary: 117 mg/dL — ABNORMAL HIGH (ref 70–99)
Glucose-Capillary: 80 mg/dL (ref 70–99)
Glucose-Capillary: 130 mg/dL — ABNORMAL HIGH (ref 70–99)

## 2019-11-28 LAB — MAGNESIUM: Magnesium: 2.7 mg/dL — ABNORMAL HIGH (ref 1.7–2.4)

## 2019-11-28 MED ORDER — FUROSEMIDE 10 MG/ML IJ SOLN
60.0000 mg | Freq: Two times a day (BID) | INTRAMUSCULAR | Status: DC
  Administered 2019-11-28: 60 mg via INTRAVENOUS
  Filled 2019-11-28: qty 6

## 2019-11-28 MED ORDER — ZOLPIDEM TARTRATE 5 MG PO TABS
5.0000 mg | ORAL_TABLET | Freq: Once | ORAL | Status: AC
  Administered 2019-11-28: 02:00:00 5 mg via ORAL
  Filled 2019-11-28: qty 1

## 2019-11-28 MED ORDER — POTASSIUM CHLORIDE CRYS ER 20 MEQ PO TBCR: 40.0000 meq | EXTENDED_RELEASE_TABLET | Freq: Three times a day (TID) | ORAL | Status: DC

## 2019-11-28 MED ORDER — POTASSIUM CHLORIDE CRYS ER 20 MEQ PO TBCR
40.0000 meq | EXTENDED_RELEASE_TABLET | Freq: Three times a day (TID) | ORAL | Status: DC
  Administered 2019-11-28: 40 meq via ORAL
  Filled 2019-11-28: qty 2

## 2019-11-28 MED ORDER — ACETAMINOPHEN 325 MG PO TABS
650.0000 mg | ORAL_TABLET | Freq: Four times a day (QID) | ORAL | Status: DC | PRN
  Administered 2019-11-28 – 2019-12-03 (×8): 650 mg via ORAL
  Filled 2019-11-28 (×7): qty 2

## 2019-11-28 MED ORDER — FUROSEMIDE 10 MG/ML IJ SOLN: 40.0000 mg | Freq: Two times a day (BID) | INTRAMUSCULAR | Status: DC

## 2019-11-28 MED ORDER — ZOLPIDEM TARTRATE 5 MG PO TABS
5.0000 mg | ORAL_TABLET | Freq: Every evening | ORAL | Status: DC | PRN
  Administered 2019-11-28 – 2019-11-30 (×2): 5 mg via ORAL
  Filled 2019-11-28 (×2): qty 1

## 2019-11-28 MED ORDER — ISOSORBIDE DINITRATE 10 MG PO TABS
20.0000 mg | ORAL_TABLET | Freq: Every day | ORAL | Status: DC
  Administered 2019-11-29: 20 mg via ORAL
  Filled 2019-11-28: qty 2

## 2019-11-28 MED ORDER — POTASSIUM CHLORIDE CRYS ER 20 MEQ PO TBCR
40.0000 meq | EXTENDED_RELEASE_TABLET | Freq: Once | ORAL | Status: AC
  Administered 2019-11-28: 40 meq via ORAL
  Filled 2019-11-28: qty 2

## 2019-11-28 MED ORDER — MAGNESIUM HYDROXIDE 400 MG/5ML PO SUSP
30.0000 mL | Freq: Two times a day (BID) | ORAL | Status: AC
  Administered 2019-11-28 (×2): 30 mL via ORAL
  Filled 2019-11-28 (×2): qty 30

## 2019-11-28 NOTE — Progress Notes (Signed)
Patient had an episode of midsternal chest pain 8/10, described as pressure.  EKG completed,  oxygen via nasal cannula at 2L applied for patient comfort.  Blood pressure 181/93, unsure of accuracy due to patient moving around while sitting on the side of the bed and talking to RN.  1 sublingual nitroglycerin administered.  Blood pressure after nitroglycerin 102/73.  Patient rating pain 3-4/10.  Patient with increased burping, Maalox administered per PRN order.  About thirty minutes later patient complaining of nausea.  RN text paged [facility], on call updated and orders received.

## 2019-11-28 NOTE — Progress Notes (Signed)
Occupational Therapy Treatment Patient Details Name: [name] [name] MRN: [id number] DOB: March 18, 1951 Today's Date: 11/28/2019    History of present illness 69 yo admitted to ED with CHF exacerbation and SOB. Pt noted to have bil LE blistering with drainage from knee to ankle. PMH includes CHF, CAD s/p CABG, aortic stenosis, s/p TAVR, Afib, HTN, obese, HLD, CKD, DM2, AICD.   OT comments  Patient seated EOB upon entry.  Limited today by headache, reports 3/10 and RN notified.  Completing transfers to recliner with min assist, pt pulling on therapist/spouse and declining to push from EOB.  Soft BP during session: EOB 93/67, partial stand BP completed after sitting 88/69; recliner seated 93/72. Reports dizziness in standing.  In recliner upon exit. Pt with eyes closed majority of session, limited engagement with therapist. Will follow acutely.    Follow Up Recommendations  No OT follow up    Equipment Recommendations  None recommended by OT    Recommendations for Other Services      Precautions / Restrictions Precautions Precautions: Fall Restrictions Weight Bearing Restrictions: No       Mobility Bed Mobility Overal bed mobility: Needs Assistance Bed Mobility: Supine to Sit;Sit to Supine     Supine to sit: Min assist     General bed mobility comments: EOB upon entry   Transfers Overall transfer level: Needs assistance Equipment used: Rolling walker (2 wheeled) Transfers: Sit to/from Stand Sit to Stand: Min assist         General transfer comment: min assist to power up and steady, pt pulling on therapist and wife; refusing to push from bed after therapist cued    Balance Overall balance assessment: Needs assistance Sitting-balance support: No upper extremity supported;Feet supported Sitting balance-Leahy Scale: Good     Standing balance support: No upper extremity supported;During functional activity Standing balance-Leahy Scale: Fair                              ADL either performed or assessed with clinical judgement   ADL Overall ADL's : Needs assistance/impaired     Grooming: Set up;Sitting                   Toilet Transfer: Minimal assistance;RW;Stand-pivot Toilet Transfer Details (indicate cue type and reason): simulated to recliner, pt pulling up on therapist and spouse (declining to push from bed)          Functional mobility during ADLs: Minimal assistance;Rolling walker       Vision   Additional Comments: pt maintains eyes closed during session, able to open but preference to keep closed   Perception     Praxis      Cognition Arousal/Alertness: Awake/alert Behavior During Therapy: Flat affect Overall Cognitive Status: Within Functional Limits for tasks assessed                                          Exercises     Shoulder Instructions       General Comments BP soft (EOB 93/67, partial stand BP completed after sitting 88/69; recliner seated 93/72)    Pertinent Vitals/ Pain       Pain Assessment: 0-10 Pain Score: 3  Pain Location: headache Pain Descriptors / Indicators: Headache Pain Intervention(s): Limited activity within patient's tolerance;Monitored during session;Repositioned;Patient requesting pain meds-RN notified  Home Living  Prior Functioning/Environment              Frequency  Min 2X/week        Progress Toward Goals  OT Goals(current goals can now be found in the care plan section)  Progress towards OT goals: Progressing toward goals  Acute Rehab OT Goals Patient Stated Goal: feel better OT Goal Formulation: With patient  Plan Discharge plan remains appropriate;Frequency remains appropriate    Co-evaluation                 AM-PAC OT "6 Clicks" Daily Activity     Outcome Measure   Help from another person eating meals?: None Help from another person taking care of personal  grooming?: A Little Help from another person toileting, which includes using toliet, bedpan, or urinal?: A Little Help from another person bathing (including washing, rinsing, drying)?: A Little Help from another person to put on and taking off regular upper body clothing?: None Help from another person to put on and taking off regular lower body clothing?: A Little 6 Click Score: 20    End of Session    OT Visit Diagnosis: Unsteadiness on feet (R26.81);Muscle weakness (generalized) (M62.81)   Activity Tolerance Patient limited by pain   Patient Left in chair;with call bell/phone within reach;with family/visitor present   Nurse Communication Mobility status;Patient requests pain meds        Time: 1339-1403 OT Time Calculation (min): 24 min  Charges: OT General Charges $OT Visit: 1 Visit OT Treatments $Self Care/Home Management : 8-22 mins $Therapeutic Activity: 8-22 mins  Jolaine Artist, OT Acute Rehabilitation Services Pager [phone] Office [phone]    Delight Stare 11/28/2019, 2:34 PM

## 2019-11-28 NOTE — Progress Notes (Signed)
Per patient was able to get some sleep after ordered medications administered close to midnight.  Per patient no pain and/or pressure in chest at this time.  Patient requesting something to help with sleep.  RN text paged [facility].

## 2019-11-28 NOTE — Progress Notes (Signed)
PROGRESS NOTE                                                                                                                                                                                                             Patient Demographics:    [name] [name], is a 69 y.o. male, DOB - 10-30-1950, KZS:[id number]  Admit date - 11/26/2019   Admitting Physician Chauncey Mann, MD  Outpatient Primary MD for the patient is Kathyrn Drown, MD  LOS - 2  Chief Complaint  Patient presents with  . Chest Pain       Brief Narrative  69 year old Caucasian male who was admitted few hours ago with symptoms of CHF with known past medical history of ischemic cardiomyopathy with chronic systolic heart failure EF of 15%, s/p CABG, aortic stenosis s/p TAVR, paroxysmal A. fib [place] vas 2 score of greater than 3, hypertension, dyslipidemia, morbid obesity, type 2 diabetes, CKD 3 who presented to the hospital with chief complaints of chest pain and shortness of breath along with leg edema.   Subjective:    Patient in bed, appears comfortable, denies any headache, no fever, no chest pain or pressure, improved shortness of breath and leg swelling, no abdominal pain. No focal weakness.  Says he does not want to work with PT, constantly arguing with various staff for no reason.   Assessment  & Plan :     1.  Acute on chronic systolic heart failure in a patient with EF less than 20% s/p TAVR for AS with reduced right-sided RV systolic function has AICD.  Patient has combination of both left and right-sided heart failure (this admission appears to be predominantly right-sided heart failure in nature due to more peripheral edema than lung involvement) , has massive leg edema and relatively spared lungs, he has been aggressively diuresed with IV Lasix, Zaroxolyn and Aldactone combination, has received a.m. dose on 11/28/2019 but will hold further diuretics as he is now getting orthostatic and slightly  hyponatremic suggesting overdiuresis.  Blood pressure too low to tolerate beta-blocker or any other blood pressure medication at this time, renal failure prevents the use of ACE/ARB or Entresto etc.  2.  Hypokalemia.  Replaced  3.  Paroxysmal atrial fibrillation.  Mali vas 2 score of at least 4.  Was in RVR with hypotension morning of 11/27/2019, blood pressure too low to tolerate beta-blocker calcium channel blockers, placed on IV amiodarone drip, for anticoagulation Lovenox for now due to unstable renal function.  4.  AKI on CKD 4 - improving with diuresis monitor.  Baseline creatinine close to 2  5.  History of AS and TAVR procedure.  Supportive care.  6.  CAD with underlying ischemic cardiomyopathy s/p CABG.  No acute issues continue combination of aspirin, Imdur and statin for secondary prevention.  Once blood pressure improves low-dose beta-blocker if tolerated.  7. OSA - ? Patient refused outpt Study, undiagnosed.  Counseled patient advised to get another sleep study.  8.  Orthostatic hypotension.  Hold further diuretics, TED stockings and monitor.     9. DM2 - on Lantus + ISS  Lab Results  Component Value Date   HGBA1C 7.2 (H) 11/26/2019    CBG (last 3)  Recent Labs    11/27/19 1601 11/27/19 2129 11/28/19 0806  GLUCAP 142* 139* 89     He has refused working with PT and remains extremely argumentative with all staff members    Family Communication  : Wife Santiago Glad on 11/27/2019 [phone]    Code Status :  Full  Disposition Plan  : Stay in the hospital for treatment of severe CHF exacerbation along with A. fib RVR  Consults  : Cardiology  Procedures  :    Echocardiogram -   1. Left ventricular ejection fraction, by estimation, is <20%. The left ventricle has severely decreased function. The left ventricle demonstrates  global hypokinesis. The left ventricular internal cavity size was moderately to severely dilated. Left ventricular diastolic function could not be  evaluated.  2. Right ventricular systolic function is moderately reduced. The right ventricular size is moderately enlarged. There is moderately elevated  pulmonary artery systolic pressure.  3. Left atrial size was moderately dilated.  4. The mitral valve is normal in structure. Mild mitral valve regurgitation. No evidence of mitral stenosis.  5. The aortic valve has been repaired/replaced. Aortic valve regurgitation is not visualized. There is a 29 mm Edwards Sapien prosthetic (TAVR) valve present in the aortic position. Procedure Date: 01/04/2018.  6. Mild pulmonic stenosis.  7. The inferior vena cava is dilated in size with <50% respiratory variability, suggesting right atrial pressure of 15 mmHg.   DVT Prophylaxis  :  Lovenox   Lab Results  Component Value Date   PLT 330 11/28/2019    Diet :  Diet Order            Diet 2 gram sodium Room service appropriate? Yes; Fluid consistency: Thin; Fluid restriction: 1200 mL Fluid  Diet effective now               Inpatient Medications Scheduled Meds: . acetaminophen  500 mg Oral BID  . aspirin EC  81 mg Oral Daily  . enoxaparin (LOVENOX) injection  1 mg/kg Subcutaneous Q24H  . insulin aspart  0-9 Units Subcutaneous TID WC  . insulin detemir  40 Units Subcutaneous QHS  . isosorbide dinitrate  30 mg Oral Daily  . magnesium hydroxide  30 mL Oral BID  . pantoprazole  40 mg Oral Daily  . potassium chloride  40 mEq Oral Once  . rosuvastatin  40 mg Oral Daily  . tamsulosin  0.4 mg Oral Daily   Continuous Infusions: . amiodarone 30 mg/hr (11/28/19 0700)   PRN Meds:.alum & mag hydroxide-simeth, metoprolol tartrate, nitroGLYCERIN, polyethylene glycol, senna-docusate  Antibiotics  :   Anti-infectives (From admission, onward)   None          Objective:   Vitals:   11/27/19 2246 11/28/19 0001 11/28/19 0531 11/28/19  0536  BP: 96/73 105/79 118/83   Pulse: 98 (!) 108 (!) 104   Resp:  12    Temp:  (!) 97.4 F (36.3 C)  97.6 F (36.4 C)   TempSrc:  Oral Oral   SpO2:  100% 99%   Weight:    120 kg  Height:        SpO2: 99 % O2 Flow Rate (L/min): 2 L/min  Wt Readings from Last 3 Encounters:  11/28/19 120 kg  10/24/19 118.8 kg  10/17/19 119.3 kg     Intake/Output Summary (Last 24 hours) at 11/28/2019 0955 Last data filed at 11/28/2019 0700 Gross per 24 hour  Intake 1033.87 ml  Output 1750 ml  Net -716.13 ml     Physical Exam  Awake Alert, No new F.N deficits, extremely argumentative with staff Gordon.AT,PERRAL Supple Neck,No JVD, No cervical lymphadenopathy appriciated.  Symmetrical Chest wall movement, Good air movement bilaterally, CTAB iRRR,No Gallops, positive old aortic systolic murmur, No Parasternal Heave +ve B.Sounds, Abd Soft, No tenderness, No organomegaly appriciated, No rebound - guarding or rigidity. No Cyanosis, Clubbing, 1+ leg edema       Data Review:    Recent Labs  Lab 11/26/19 0120 11/27/19 0058 11/28/19 0257  WBC 7.2 6.4 8.0  HGB 13.4 12.7* 13.9  HCT 44.2 40.0 44.5  PLT 312 291 330  MCV 99.8 95.9 96.1  MCH 30.2 30.5 30.0  MCHC 30.3 31.8 31.2  RDW 16.8* 16.7* 16.9*    Recent Labs  Lab 11/26/19 0120 11/26/19 0450 11/26/19 0605 11/27/19 0058 11/28/19 0257  NA 133*  --   --  135 133*  K 4.7  --   --  3.2* 3.2*  CL 81*  --   --  84* 83*  CO2 42*  --   --  35* 32  GLUCOSE 149*  --   --  174* 99  BUN 53*  --   --  53* 53*  CREATININE 3.15*  --   --  2.53* 2.52*  CALCIUM 8.7*  --   --  8.6* 8.9  AST  --   --   --  20 23  ALT  --   --   --  12 14  ALKPHOS  --   --   --  110 117  BILITOT  --   --   --  1.7* 1.3*  ALBUMIN  --   --   --  2.9* 3.0*  MG  --   --   --  2.6* 2.7*  INR 2.0*  --   --   --   --   HGBA1C  --   --  7.2*  --   --   BNP  --  882.5*  --  1,036.2* 1,167.5*    Recent Labs  Lab 11/26/19 0450 11/26/19 0730 11/27/19 0058 11/28/19 0257  BNP 882.5*  --  1,036.2* 1,167.5*  SARSCOV2NAA  --  NEGATIVE  --   --       ------------------------------------------------------------------------------------------------------------------ No results for input(s): CHOL, HDL, LDLCALC, TRIG, CHOLHDL, LDLDIRECT in the last 72 hours.  Lab Results  Component Value Date   HGBA1C 7.2 (H) 11/26/2019   ------------------------------------------------------------------------------------------------------------------ No results for input(s): TSH, T4TOTAL, T3FREE, THYROIDAB in the last 72 hours.  Invalid input(s): FREET3 ------------------------------------------------------------------------------------------------------------------ No results for input(s): VITAMINB12, FOLATE, FERRITIN, TIBC, IRON, RETICCTPCT in the last 72 hours.  Coagulation profile Recent Labs  Lab 11/26/19 0120  INR 2.0*    No results for input(s):  DDIMER in the last 72 hours.  Cardiac Enzymes No results for input(s): CKMB, TROPONINI, MYOGLOBIN in the last 168 hours.  Invalid input(s): CK ------------------------------------------------------------------------------------------------------------------    Component Value Date/Time   BNP 1,167.5 (H) 11/28/2019 0257    Micro Results Recent Results (from the past 240 hour(s))  SARS CORONAVIRUS 2 (TAT 6-24 HRS) Nasopharyngeal Nasopharyngeal Swab     Status: None   Collection Time: 11/26/19  7:30 AM   Specimen: Nasopharyngeal Swab  Result Value Ref Range Status   SARS Coronavirus 2 NEGATIVE NEGATIVE Final    Comment: (NOTE) SARS-CoV-2 target nucleic acids are NOT DETECTED. The SARS-CoV-2 RNA is generally detectable in upper and lower respiratory specimens during the acute phase of infection. Negative results do not preclude SARS-CoV-2 infection, do not rule out co-infections with other pathogens, and should not be used as the sole basis for treatment or other patient management decisions. Negative results must be combined with clinical observations, patient history, and epidemiological  information. The expected result is Negative. Fact Sheet for Patients: SugarRoll.be Fact Sheet for Healthcare Providers: https://www.woods-mathews.com/ This test is not yet approved or cleared by the [place] FDA and  has been authorized for detection and/or diagnosis of SARS-CoV-2 by FDA under an Emergency Use Authorization (EUA). This EUA will remain  in effect (meaning this test can be used) for the duration of the COVID-19 declaration under Section 56 4(b)(1) of the Act, 21 U.S.C. section 360bbb-3(b)(1), unless the authorization is terminated or revoked sooner. Performed at [facility], [place] [address], [place], [place] [address]     Radiology Reports DG Chest 2 View  Result Date: 11/26/2019 CLINICAL DATA:  Chest pain and shortness of breath EXAM: CHEST - 2 VIEW COMPARISON:  08/25/2019 FINDINGS: Small pleural effusions. Moderate cardiomegaly. Aortic valve replacement. Unchanged position of left chest wall AICD single lead. No focal airspace consolidation or pulmonary edema. IMPRESSION: Cardiomegaly and small bilateral pleural effusions. Electronically Signed   By: Ulyses Jarred M.D.   On: 11/26/2019 01:58   ECHOCARDIOGRAM COMPLETE  Result Date: 11/26/2019    ECHOCARDIOGRAM REPORT   Patient Name:   PERICLES CARMICHEAL Date of Exam: 11/26/2019 Medical Rec #:  [id number]       Height:       70.0 in Accession #:    [id number]      Weight:       275.6 lb Date of Birth:  08/01/1951       BSA:          2.392 m Patient Age:    2 years        BP:           107/75 mmHg Patient Gender: M               HR:           113 bpm. Exam Location:  Inpatient Procedure: 2D Echo and Intracardiac Opacification Agent Indications:    CHF-Acute Systolic 213.08 / M57.84  History:        Patient has prior history of Echocardiogram examinations, most                 recent 03/04/2019. CAD and Previous Myocardial Infarction, Prior                 CABG and Defibrillator,  Arrythmias:Atrial Fibrillation; Risk                 Factors:Dyslipidemia, Diabetes and Sleep Apnea. Ischemic  cardiomyopathy.                 Aortic Valve: 29 mm Edwards Sapien prosthetic, stented (TAVR)                 valve is present in the aortic position. Procedure Date:                 01/04/2018.  Sonographer:    Darlina Sicilian RDCS Referring Phys: [id number] [place]  1. Left ventricular ejection fraction, by estimation, is <20%. The left ventricle has severely decreased function. The left ventricle demonstrates global hypokinesis. The left ventricular internal cavity size was moderately to severely dilated. Left ventricular diastolic function could not be evaluated.  2. Right ventricular systolic function is moderately reduced. The right ventricular size is moderately enlarged. There is moderately elevated pulmonary artery systolic pressure.  3. Left atrial size was moderately dilated.  4. The mitral valve is normal in structure. Mild mitral valve regurgitation. No evidence of mitral stenosis.  5. The aortic valve has been repaired/replaced. Aortic valve regurgitation is not visualized. There is a 29 mm Edwards Sapien prosthetic (TAVR) valve present in the aortic position. Procedure Date: 01/04/2018.  6. Mild pulmonic stenosis.  7. The inferior vena cava is dilated in size with <50% respiratory variability, suggesting right atrial pressure of 15 mmHg. Comparison(s): No significant change from prior study. FINDINGS  Left Ventricle: Left ventricular ejection fraction, by estimation, is <20%. The left ventricle has severely decreased function. The left ventricle demonstrates global hypokinesis. Definity contrast agent was given IV to delineate the left ventricular endocardial borders. The left ventricular internal cavity size was moderately to severely dilated. There is no left ventricular hypertrophy. Left ventricular diastolic function could not be evaluated. Right Ventricle:  The right ventricular size is moderately enlarged. No increase in right ventricular wall thickness. Right ventricular systolic function is moderately reduced. There is moderately elevated pulmonary artery systolic pressure. The tricuspid  regurgitant velocity is 3.02 m/s, and with an assumed right atrial pressure of 15 mmHg, the estimated right ventricular systolic pressure is 81.8 mmHg. Left Atrium: Left atrial size was moderately dilated. Right Atrium: Right atrial size was not well visualized. Pericardium: There is no evidence of pericardial effusion. Mitral Valve: The mitral valve is normal in structure. Mild mitral annular calcification. Mild mitral valve regurgitation. No evidence of mitral valve stenosis. Tricuspid Valve: The tricuspid valve is normal in structure. Tricuspid valve regurgitation is mild . No evidence of tricuspid stenosis. Aortic Valve: The aortic valve has been repaired/replaced. Aortic valve regurgitation is not visualized. Aortic valve mean gradient measures 4.0 mmHg. Aortic valve peak gradient measures 7.7 mmHg. Aortic valve area, by VTI measures 3.69 cm. There is a 29 mm Edwards Sapien prosthetic, stented (TAVR) valve present in the aortic position. Procedure Date: 01/04/2018. Pulmonic Valve: The pulmonic valve was grossly normal. Pulmonic valve regurgitation is mild to moderate. Mild pulmonic stenosis. Aorta: The aortic root and ascending aorta are structurally normal, with no evidence of dilitation. Venous: The inferior vena cava is dilated in size with less than 50% respiratory variability, suggesting right atrial pressure of 15 mmHg. IAS/Shunts: No atrial level shunt detected by color flow Doppler. Additional Comments: A pacer wire is visualized in the right ventricle and right atrium.  LEFT VENTRICLE PLAX 2D LVIDd:         6.29 cm LVIDs:         5.56 cm LV PW:  1.02 cm LV IVS:        1.05 cm LVOT diam:     2.70 cm LV SV:         85 LV SV Index:   36 LVOT Area:     5.73 cm   LV Volumes (MOD) LV vol d, MOD A2C: 341.0 ml LV vol d, MOD A4C: 256.0 ml LV vol s, MOD A2C: 270.0 ml LV vol s, MOD A4C: 206.0 ml LV SV MOD A2C:     71.0 ml LV SV MOD A4C:     256.0 ml LV SV MOD BP:      62.7 ml RIGHT VENTRICLE TAPSE (M-mode): 1.5 cm LEFT ATRIUM              Index LA diam:        5.50 cm  2.30 cm/m LA Vol (A2C):   110.0 ml 45.99 ml/m LA Vol (A4C):   135.0 ml 56.44 ml/m LA Biplane Vol: 127.0 ml 53.09 ml/m  AORTIC VALVE AV Area (Vmax):    4.34 cm AV Area (Vmean):   4.36 cm AV Area (VTI):     3.69 cm AV Vmax:           138.50 cm/s AV Vmean:          94.050 cm/s AV VTI:            0.231 m AV Peak Grad:      7.7 mmHg AV Mean Grad:      4.0 mmHg LVOT Vmax:         105.00 cm/s LVOT Vmean:        71.600 cm/s LVOT VTI:          0.149 m LVOT/AV VTI ratio: 0.65  AORTA Ao Root diam: 3.20 cm MITRAL VALVE               TRICUSPID VALVE MV Area (PHT): 6.43 cm    TR Peak grad:   36.5 mmHg MV Decel Time: 118 msec    TR Vmax:        302.00 cm/s MV E velocity: 99.15 cm/s                            SHUNTS                            Systemic VTI:  0.15 m                            Systemic Diam: 2.70 cm Buford Dresser MD Electronically signed by Buford Dresser MD Signature Date/Time: 11/26/2019/4:53:33 PM    Final     Time Spent in minutes  30   Lala Lund M.D on 11/28/2019 at 9:55 AM  To page go to [email or web address] - password [facility]

## 2019-11-28 NOTE — Progress Notes (Signed)
Physical Therapy Treatment Patient Details Name: [name] [name] MRN: [id number] DOB: 1950-09-27 Today's Date: 11/28/2019    History of Present Illness 69 yo admitted to ED with CHF exacerbation and SOB. Pt noted to have bil LE blistering with drainage from knee to ankle. PMH includes CHF, CAD s/p CABG, aortic stenosis, s/p TAVR, Afib, HTN, obese, HLD, CKD, DM2, AICD.    PT Comments    Pt admitted with above diagnosis. Pt was able to ambulate into hallway with RW with min guard assist without dizziness. Orthostatic BPs as below with BP soft initially and slight drop but no dizziness per pt.  Will continue to progress pt as able.  Pt currently with functional limitations due to balance and endurance deficits. Pt will benefit from skilled PT to increase their independence and safety with mobility to allow discharge to the venue listed below.   Orthostatic BPs  Supine 79 bpm, 99/73  Sitting 81 bpm, 94/63  Standing 84 bpm, 82/66  Standing after 3 min 102 bpm, 92/63     Follow Up Recommendations  Supervision for mobility/OOB;Home health PT     Equipment Recommendations  None recommended by PT    Recommendations for Other Services       Precautions / Restrictions Precautions Precautions: Fall Restrictions Weight Bearing Restrictions: No    Mobility  Bed Mobility Overal bed mobility: Needs Assistance Bed Mobility: Supine to Sit;Sit to Supine     Supine to sit: Min assist     General bed mobility comments: Min A for trunk elevation. Pt lethargic and having difficulty keep ing eyes open stating he didnt sleep last night. No dizziness.   Transfers Overall transfer level: Needs assistance Equipment used: Rolling walker (2 wheeled) Transfers: Sit to/from Stand Sit to Stand: Min guard;+2 safety/equipment         General transfer comment: min guard for safety.   Ambulation/Gait Ambulation/Gait assistance: Min guard Gait Distance (Feet): 110 Feet Assistive device: Rolling  walker (2 wheeled)   Gait velocity: Decreased Gait velocity interpretation: <1.31 ft/sec, indicative of household ambulator General Gait Details: Pt was able to ambulate with rW to hallway and back with min guard assist.  Slow moving and cues to keep eyes open.    Stairs             Wheelchair Mobility    Modified Rankin (Stroke Patients Only)       Balance Overall balance assessment: Needs assistance Sitting-balance support: No upper extremity supported;Feet supported Sitting balance-Leahy Scale: Good     Standing balance support: No upper extremity supported;During functional activity Standing balance-Leahy Scale: Fair                              Cognition Arousal/Alertness: Awake/alert Behavior During Therapy: WFL for tasks assessed/performed Overall Cognitive Status: Within Functional Limits for tasks assessed                                        Exercises      General Comments General comments (skin integrity, edema, etc.): BP slightly orthostatic but not significant.  was going to test for vestibular issues but pt could not hold eyes open. Pt also states that he is not spinning or dizzy today.       Pertinent Vitals/Pain Pain Assessment: No/denies pain    Home Living  Prior Function            PT Goals (current goals can now be found in the care plan section) Acute Rehab PT Goals Patient Stated Goal: to get swelling down Progress towards PT goals: Progressing toward goals    Frequency    Min 3X/week      PT Plan Current plan remains appropriate    Co-evaluation              AM-PAC PT "6 Clicks" Mobility   Outcome Measure  Help needed turning from your back to your side while in a flat bed without using bedrails?: None Help needed moving from lying on your back to sitting on the side of a flat bed without using bedrails?: A Little Help needed moving to and from a bed to  a chair (including a wheelchair)?: A Little Help needed standing up from a chair using your arms (e.g., wheelchair or bedside chair)?: A Little Help needed to walk in hospital room?: A Little Help needed climbing 3-5 steps with a railing? : A Little 6 Click Score: 19    End of Session Equipment Utilized During Treatment: Gait belt Activity Tolerance: Patient tolerated treatment well Patient left: with call bell/phone within reach;with family/visitor present(on toilet in bathroom) Nurse Communication: Mobility status PT Visit Diagnosis: Other symptoms and signs involving the nervous system (L38.101)     Time: 7510-2585 PT Time Calculation (min) (ACUTE ONLY): 26 min  Charges:  $Gait Training: 8-22 mins $Therapeutic Activity: 8-22 mins                     Ezreal Turay W,PT Acute Rehabilitation Services Pager:  [phone]  Office:  [place] 11/28/2019, 1:10 PM

## 2019-11-28 NOTE — Progress Notes (Addendum)
Progress Note  Patient Name: [name] [name] Date of Encounter: 11/28/2019  Primary Cardiologist: Carlyle Dolly, MD   Subjective   Patient had a couple of episodes of chest pain yesterday that improved with Nitro. During episode last night he also had increased burping so GI cocktail was given. He denies any chest pain or shortness of breath this morning. He did have some lightheadedness when walking to the restroom this morning but no falls or syncope. No palpitations.   Inpatient Medications    Scheduled Meds:  acetaminophen  500 mg Oral BID   aspirin EC  81 mg Oral Daily   enoxaparin (LOVENOX) injection  1 mg/kg Subcutaneous Q24H   furosemide  60 mg Intravenous BID   insulin aspart  0-9 Units Subcutaneous TID WC   insulin detemir  40 Units Subcutaneous QHS   isosorbide dinitrate  30 mg Oral Daily   magnesium hydroxide  30 mL Oral BID   pantoprazole  40 mg Oral Daily   potassium chloride  40 mEq Oral TID   rosuvastatin  40 mg Oral Daily   spironolactone  25 mg Oral Daily   tamsulosin  0.4 mg Oral Daily   Continuous Infusions:  amiodarone 30 mg/hr (11/28/19 0700)   PRN Meds: alum & mag hydroxide-simeth, metoprolol tartrate, nitroGLYCERIN, polyethylene glycol, senna-docusate   Vital Signs    Vitals:   11/27/19 2246 11/28/19 0001 11/28/19 0531 11/28/19 0536  BP: 96/73 105/79 118/83   Pulse: 98 (!) 108 (!) 104   Resp:  12    Temp:  (!) 97.4 F (36.3 C) 97.6 F (36.4 C)   TempSrc:  Oral Oral   SpO2:  100% 99%   Weight:    120 kg  Height:        Intake/Output Summary (Last 24 hours) at 11/28/2019 0837 Last data filed at 11/28/2019 0700 Gross per 24 hour  Intake 1255.87 ml  Output 2100 ml  Net -844.13 ml   Last 3 Weights 11/28/2019 11/27/2019 11/26/2019  Weight (lbs) 264 lb 8.8 oz 263 lb 14.4 oz 268 lb 12.8 oz  Weight (kg) 120 kg 119.704 kg 121.927 kg      Telemetry    Sinus rhythm with rates in the 80's to low 100's.  - Personally Reviewed  ECG      No new ECG tracing today. - Personally Reviewed  Physical Exam   GEN: No acute distress.   Neck: No JVD Cardiac: Borderline tachycardic but regular rhythm. No murmurs, rubs, or gallops.  Respiratory: Clear to auscultation bilaterally. No wheezes, rhonchi, or rales.  GI: Soft, non-distended, and non-tender. Bowel sounds present. MS: 2+ lower extremity edema. Legs wrapped. No deformity. Skin: Warm and dry. Neuro:  No focal deficits. Psych: Normal affect. Responds appropriately.   Labs    High Sensitivity Troponin:   Recent Labs  Lab 11/26/19 0120 11/26/19 0330 11/26/19 0621 11/26/19 0808 11/27/19 2348  TROPONINIHS 22* 21* 18* 16 16      Chemistry Recent Labs  Lab 11/26/19 0120 11/27/19 0058 11/28/19 0257  NA 133* 135 133*  K 4.7 3.2* 3.2*  CL 81* 84* 83*  CO2 42* 35* 32  GLUCOSE 149* 174* 99  BUN 53* 53* 53*  CREATININE 3.15* 2.53* 2.52*  CALCIUM 8.7* 8.6* 8.9  PROT  --  6.9 7.4  ALBUMIN  --  2.9* 3.0*  AST  --  20 23  ALT  --  12 14  ALKPHOS  --  110 117  BILITOT  --  1.7* 1.3*  GFRNONAA 19* 25* 25*  GFRAA 22* 29* 29*  ANIONGAP 10 16* 18*     Hematology Recent Labs  Lab 11/26/19 0120 11/27/19 0058 11/28/19 0257  WBC 7.2 6.4 8.0  RBC 4.43 4.17* 4.63  HGB 13.4 12.7* 13.9  HCT 44.2 40.0 44.5  MCV 99.8 95.9 96.1  MCH 30.2 30.5 30.0  MCHC 30.3 31.8 31.2  RDW 16.8* 16.7* 16.9*  PLT 312 291 330    BNP Recent Labs  Lab 11/26/19 0450 11/27/19 0058 11/28/19 0257  BNP 882.5* 1,036.2* 1,167.5*     DDimer No results for input(s): DDIMER in the last 168 hours.   Radiology    ECHOCARDIOGRAM COMPLETE  Result Date: 11/26/2019    ECHOCARDIOGRAM REPORT   Patient Name:   [name] [name] Date of Exam: 11/26/2019 Medical Rec #:  [id number]       Height:       70.0 in Accession #:    [id number]      Weight:       275.6 lb Date of Birth:  1951-01-24       BSA:          2.392 m Patient Age:    69 years        BP:           107/75 mmHg Patient Gender: M                HR:           113 bpm. Exam Location:  Inpatient Procedure: 2D Echo and Intracardiac Opacification Agent Indications:    CHF-Acute Systolic 846.96 / E95.28  History:        Patient has prior history of Echocardiogram examinations, most                 recent 03/04/2019. CAD and Previous Myocardial Infarction, Prior                 CABG and Defibrillator, Arrythmias:Atrial Fibrillation; Risk                 Factors:Dyslipidemia, Diabetes and Sleep Apnea. Ischemic                 cardiomyopathy.                 Aortic Valve: 29 mm Edwards Sapien prosthetic, stented (TAVR)                 valve is present in the aortic position. Procedure Date:                 01/04/2018.  Sonographer:    Darlina Sicilian RDCS Referring Phys: [id number] [place]  1. Left ventricular ejection fraction, by estimation, is <20%. The left ventricle has severely decreased function. The left ventricle demonstrates global hypokinesis. The left ventricular internal cavity size was moderately to severely dilated. Left ventricular diastolic function could not be evaluated.  2. Right ventricular systolic function is moderately reduced. The right ventricular size is moderately enlarged. There is moderately elevated pulmonary artery systolic pressure.  3. Left atrial size was moderately dilated.  4. The mitral valve is normal in structure. Mild mitral valve regurgitation. No evidence of mitral stenosis.  5. The aortic valve has been repaired/replaced. Aortic valve regurgitation is not visualized. There is a 29 mm Edwards Sapien prosthetic (TAVR) valve present in the aortic position. Procedure Date: 01/04/2018.  6. Mild pulmonic stenosis.  7. The inferior vena  cava is dilated in size with <50% respiratory variability, suggesting right atrial pressure of 15 mmHg. Comparison(s): No significant change from prior study. FINDINGS  Left Ventricle: Left ventricular ejection fraction, by estimation, is <20%. The left ventricle has  severely decreased function. The left ventricle demonstrates global hypokinesis. Definity contrast agent was given IV to delineate the left ventricular endocardial borders. The left ventricular internal cavity size was moderately to severely dilated. There is no left ventricular hypertrophy. Left ventricular diastolic function could not be evaluated. Right Ventricle: The right ventricular size is moderately enlarged. No increase in right ventricular wall thickness. Right ventricular systolic function is moderately reduced. There is moderately elevated pulmonary artery systolic pressure. The tricuspid  regurgitant velocity is 3.02 m/s, and with an assumed right atrial pressure of 15 mmHg, the estimated right ventricular systolic pressure is 72.0 mmHg. Left Atrium: Left atrial size was moderately dilated. Right Atrium: Right atrial size was not well visualized. Pericardium: There is no evidence of pericardial effusion. Mitral Valve: The mitral valve is normal in structure. Mild mitral annular calcification. Mild mitral valve regurgitation. No evidence of mitral valve stenosis. Tricuspid Valve: The tricuspid valve is normal in structure. Tricuspid valve regurgitation is mild . No evidence of tricuspid stenosis. Aortic Valve: The aortic valve has been repaired/replaced. Aortic valve regurgitation is not visualized. Aortic valve mean gradient measures 4.0 mmHg. Aortic valve peak gradient measures 7.7 mmHg. Aortic valve area, by VTI measures 3.69 cm. There is a 29 mm Edwards Sapien prosthetic, stented (TAVR) valve present in the aortic position. Procedure Date: 01/04/2018. Pulmonic Valve: The pulmonic valve was grossly normal. Pulmonic valve regurgitation is mild to moderate. Mild pulmonic stenosis. Aorta: The aortic root and ascending aorta are structurally normal, with no evidence of dilitation. Venous: The inferior vena cava is dilated in size with less than 50% respiratory variability, suggesting right atrial  pressure of 15 mmHg. IAS/Shunts: No atrial level shunt detected by color flow Doppler. Additional Comments: A pacer wire is visualized in the right ventricle and right atrium.  LEFT VENTRICLE PLAX 2D LVIDd:         6.29 cm LVIDs:         5.56 cm LV PW:         1.02 cm LV IVS:        1.05 cm LVOT diam:     2.70 cm LV SV:         85 LV SV Index:   36 LVOT Area:     5.73 cm  LV Volumes (MOD) LV vol d, MOD A2C: 341.0 ml LV vol d, MOD A4C: 256.0 ml LV vol s, MOD A2C: 270.0 ml LV vol s, MOD A4C: 206.0 ml LV SV MOD A2C:     71.0 ml LV SV MOD A4C:     256.0 ml LV SV MOD BP:      62.7 ml RIGHT VENTRICLE TAPSE (M-mode): 1.5 cm LEFT ATRIUM              Index LA diam:        5.50 cm  2.30 cm/m LA Vol (A2C):   110.0 ml 45.99 ml/m LA Vol (A4C):   135.0 ml 56.44 ml/m LA Biplane Vol: 127.0 ml 53.09 ml/m  AORTIC VALVE AV Area (Vmax):    4.34 cm AV Area (Vmean):   4.36 cm AV Area (VTI):     3.69 cm AV Vmax:           138.50 cm/s AV Vmean:  94.050 cm/s AV VTI:            0.231 m AV Peak Grad:      7.7 mmHg AV Mean Grad:      4.0 mmHg LVOT Vmax:         105.00 cm/s LVOT Vmean:        71.600 cm/s LVOT VTI:          0.149 m LVOT/AV VTI ratio: 0.65  AORTA Ao Root diam: 3.20 cm MITRAL VALVE               TRICUSPID VALVE MV Area (PHT): 6.43 cm    TR Peak grad:   36.5 mmHg MV Decel Time: 118 msec    TR Vmax:        302.00 cm/s MV E velocity: 99.15 cm/s                            SHUNTS                            Systemic VTI:  0.15 m                            Systemic Diam: 2.70 cm Buford Dresser MD Electronically signed by Buford Dresser MD Signature Date/Time: 11/26/2019/4:53:33 PM    Final     Cardiac Studies   Echocardiogram 11/26/2019: Impressions: 1. Left ventricular ejection fraction, by estimation, is <20%. The left  ventricle has severely decreased function. The left ventricle demonstrates  global hypokinesis. The left ventricular internal cavity size was  moderately to severely dilated. Left   ventricular diastolic function could not be evaluated.  2. Right ventricular systolic function is moderately reduced. The right  ventricular size is moderately enlarged. There is moderately elevated  pulmonary artery systolic pressure.  3. Left atrial size was moderately dilated.  4. The mitral valve is normal in structure. Mild mitral valve  regurgitation. No evidence of mitral stenosis.  5. The aortic valve has been repaired/replaced. Aortic valve  regurgitation is not visualized. There is a 29 mm Edwards Sapien  prosthetic (TAVR) valve present in the aortic position. Procedure Date:  01/04/2018.  6. Mild pulmonic stenosis.  7. The inferior vena cava is dilated in size with <50% respiratory  variability, suggesting right atrial pressure of 15 mmHg.  Patient Profile     Mr. Porter is a 69 y.o. male with a history of CAD s/p CABG x6 in 2001, ischemic cardiomyopathy with EF of 15-20% s/p ICD in 04/2016, paroxysmal atrial fibrillation on Xarelto, LBBB, aortic stenosis s/p TAVR in 10/2017, hypertension, hyperlipidemia, type 2 diabetes mellitus, CKD stage III, and obesity who is being seen for evaluation of CHF at the request of Dr. Dayna Barker.   Assessment & Plan    Acute on Chronic Systolic CHF/Ischemic Cardiomyopathy - Patient presented with chest pain and shortness of breath with orthopnea and significant lower extremity edema despite PCP increasing home Torsemide.  - BNP trending up over the last couple of days 882 >> 1,036 >> 1,167.  - Chest x-ray showed cardiomegaly with small bilateral pleural effusion but no overt edema.  - Echo this admission shows LVEF of <20% with global hypokinesis. RV also moderately enlarged with moderately reduced systolic function. Moderately elevated PASP. - Patient has significant lower extremity edema with weeping of legs but lungs are clear.  -  IV Lasix decreased to 60mg  twice daily yesterday with first dose scheduled for this morning. Patient has also  received a dose of Metolazone 2.5mg  the last 2 days. Urinary output of 2.1 L in the past 24 hours and net negative 2.8 L since admission. Weight down 11 lbs from admission. Renal function stable.  - Continue current dose of Lasix. May need additional dose of Metolazone. Will discuss with MD. - Continue Spironolactone 25mg  daily.  - Home Coreg discontinued due to soft BP. - No ACEi/ARB/Enresto due to soft BP and renal function. - Continue leg wraps/compression stockings for lower extremity edema.  - Monitor daily weights, strict I/O's, and renal function.  Chest Pain with Known CAD - History of CAD s/p CABG in 2001. Most recent cath in 09/2017 showed patent grafts.  - EKG shows sinus tachycardia with known LBBB. - High-sensitivity initially minimally elevated but have normalized (22 >> 21 >> 18 >> 16 >> 16). Not consistent with ACS. - Echo as above. - Currently chest pain free but did have 2 episodes of chest pain yesterday afternoon/evening. - Continue aspirin, beta-blocker, and statin.   Aortic Stenosis s/p TAVR in 10/2017 - Echo this admission showed no AI. Mean gradient 4.0 mmHg and peak gradient 7.7 mmHg. Aortic valve area by VTI 3.69 cm^2.   Paroxysmal Atrial Fibrillation  - Currently in sinus rhythm.  - Home Coreg discontinued due to soft BP. - Continue Amiodarone drip for now. Will discuss with MD about when to switch to PO. - On Xarelto at home. However on Heparin here due to AKI.  Hypertension - BP soft at times but stable. - Continue Spironolactone as above. - Home Coreg discontinued due to soft BP. - Patient did have an episodes of lightheadedness this morning when ambulating to the restroom.  - Will check orthostatics.    Hyperlipidemia - Continue Crestor 40mg  daily.   Acute on Chronic Kidney Disease Stage III - Creatinine 3.15 on admission. Gradually improving and 2.52 today.  Baseline around 1.5.  - Avoid Nephrotoxic agents. - Continue to monitor.    Hypokalemia - Potassium 3.2 today. Primary team repleteing. - Continue to monitor.  For questions or updates, please contact [place] Please consult [email or web address] for contact info under        Signed, Darreld Mclean, PA-C  11/28/2019, 8:37 AM     Attending Note:   The patient was seen and examined.  Agree with assessment and plan as noted above.  Changes made to the above note as needed.  Patient seen and independently examined with  Sande Rives, PA .   We discussed all aspects of the encounter. I agree with the assessment and plan as stated above.  1.  Acute on chronic biventricular congestive heart failure: He continues to diurese.  He still has quite a bit of leg edema.  The abdominal wall edema seems to improving.  Continue Lasix.  He needs to avoid eating salty foods once he is discharged.  2.  Status post TAVR: His TAVR seems to be working properly.  3.  Known coronary artery disease: He is not having any angina.  Troponin levels are normal.  4.  Hypertension: Blood pressure seems to be stable.  5.  Hyperlipidemia: Continue rosuvastatin.  5.  Acute on chronic renal disease: Creatinine on presentation was 3.15.  Is now 2.52 which may be close to his new baseline.  Further plans per internal medicine team.   I have spent a total of 40 minutes  with patient reviewing hospital  notes , telemetry, EKGs, labs and examining patient as well as establishing an assessment and plan that was discussed with the patient. > 50% of time was spent in direct patient care.    Thayer Headings, Brooke Bonito., MD, [facility] 11/28/2019, 11:20 AM [address],  [place] Pager [phone]

## 2019-11-28 NOTE — Progress Notes (Signed)
OT Cancellation Note  Patient Details Name: [name] [name] MRN: [id number] DOB: 11-13-1950   Cancelled Treatment:    Reason Eval/Treat Not Completed: Patient declined, no reason specified, pt requests to eat breakfast at this time.  Will follow and see as able.   Jolaine Artist, OT Acute Rehabilitation Services Pager [phone] Office [phone]    Delight Stare 11/28/2019, 9:10 AM

## 2019-11-29 DIAGNOSIS — I480 Paroxysmal atrial fibrillation: Secondary | ICD-10-CM

## 2019-11-29 DIAGNOSIS — Z95810 Presence of automatic (implantable) cardiac defibrillator: Secondary | ICD-10-CM

## 2019-11-29 DIAGNOSIS — Z951 Presence of aortocoronary bypass graft: Secondary | ICD-10-CM

## 2019-11-29 DIAGNOSIS — N1832 Chronic kidney disease, stage 3b: Secondary | ICD-10-CM

## 2019-11-29 DIAGNOSIS — I255 Ischemic cardiomyopathy: Secondary | ICD-10-CM

## 2019-11-29 DIAGNOSIS — I5042 Chronic combined systolic (congestive) and diastolic (congestive) heart failure: Secondary | ICD-10-CM

## 2019-11-29 LAB — CBC WITH DIFFERENTIAL/PLATELET
Abs Immature Granulocytes: 0.02 10*3/uL (ref 0.00–0.07)
Basophils Absolute: 0 10*3/uL (ref 0.0–0.1)
Basophils Relative: 0 %
Eosinophils Absolute: 0.1 10*3/uL (ref 0.0–0.5)
Eosinophils Relative: 1 %
HCT: 43.1 % (ref 39.0–52.0)
Hemoglobin: 13.2 g/dL (ref 13.0–17.0)
Immature Granulocytes: 0 %
Lymphocytes Relative: 14 %
Lymphs Abs: 1.1 10*3/uL (ref 0.7–4.0)
MCH: 29.7 pg (ref 26.0–34.0)
MCHC: 30.6 g/dL (ref 30.0–36.0)
MCV: 97.1 fL (ref 80.0–100.0)
Monocytes Absolute: 1.6 10*3/uL — ABNORMAL HIGH (ref 0.1–1.0)
Monocytes Relative: 19 %
Neutro Abs: 5.5 10*3/uL (ref 1.7–7.7)
Neutrophils Relative %: 66 %
Platelets: 354 10*3/uL (ref 150–400)
RBC: 4.44 MIL/uL (ref 4.22–5.81)
RDW: 17.1 % — ABNORMAL HIGH (ref 11.5–15.5)
WBC: 8.2 10*3/uL (ref 4.0–10.5)
nRBC: 0 % (ref 0.0–0.2)

## 2019-11-29 LAB — COMPREHENSIVE METABOLIC PANEL
ALT: 20 U/L (ref 0–44)
AST: 37 U/L (ref 15–41)
Albumin: 2.9 g/dL — ABNORMAL LOW (ref 3.5–5.0)
Alkaline Phosphatase: 120 U/L (ref 38–126)
Anion gap: 16 — ABNORMAL HIGH (ref 5–15)
BUN: 55 mg/dL — ABNORMAL HIGH (ref 8–23)
CO2: 35 mmol/L — ABNORMAL HIGH (ref 22–32)
Calcium: 9 mg/dL (ref 8.9–10.3)
Chloride: 83 mmol/L — ABNORMAL LOW (ref 98–111)
Creatinine, Ser: 2.83 mg/dL — ABNORMAL HIGH (ref 0.61–1.24)
GFR calc Af Amer: 25 mL/min — ABNORMAL LOW (ref >60)
GFR calc non Af Amer: 22 mL/min — ABNORMAL LOW (ref >60)
Glucose, Bld: 65 mg/dL — ABNORMAL LOW (ref 70–99)
Potassium: 4.4 mmol/L (ref 3.5–5.1)
Sodium: 134 mmol/L — ABNORMAL LOW (ref 135–145)
Total Bilirubin: 1.5 mg/dL — ABNORMAL HIGH (ref 0.3–1.2)
Total Protein: 7.1 g/dL (ref 6.5–8.1)

## 2019-11-29 LAB — GLUCOSE, CAPILLARY
Glucose-Capillary: 67 mg/dL — ABNORMAL LOW (ref 70–99)
Glucose-Capillary: 97 mg/dL (ref 70–99)
Glucose-Capillary: 48 mg/dL — ABNORMAL LOW (ref 70–99)
Glucose-Capillary: 104 mg/dL — ABNORMAL HIGH (ref 70–99)
Glucose-Capillary: 119 mg/dL — ABNORMAL HIGH (ref 70–99)

## 2019-11-29 LAB — MAGNESIUM: Magnesium: 3.1 mg/dL — ABNORMAL HIGH (ref 1.7–2.4)

## 2019-11-29 LAB — BRAIN NATRIURETIC PEPTIDE: B Natriuretic Peptide: 872.9 pg/mL — ABNORMAL HIGH (ref 0.0–100.0)

## 2019-11-29 MED ORDER — FUROSEMIDE 10 MG/ML IJ SOLN
120.0000 mg | Freq: Two times a day (BID) | INTRAVENOUS | Status: DC
  Administered 2019-11-29 – 2019-12-03 (×8): 120 mg via INTRAVENOUS
  Filled 2019-11-29 (×2): qty 12
  Filled 2019-11-29: qty 10
  Filled 2019-11-29: qty 12
  Filled 2019-11-29: qty 10
  Filled 2019-11-29: qty 12
  Filled 2019-11-29 (×2): qty 10
  Filled 2019-11-29: qty 12
  Filled 2019-11-29: qty 2
  Filled 2019-11-29: qty 10
  Filled 2019-11-29: qty 12

## 2019-11-29 MED ORDER — INSULIN DETEMIR 100 UNIT/ML ~~LOC~~ SOLN
30.0000 [IU] | Freq: Every day | SUBCUTANEOUS | Status: DC
  Administered 2019-11-29: 30 [IU] via SUBCUTANEOUS
  Filled 2019-11-29 (×2): qty 0.3

## 2019-11-29 MED ORDER — AMIODARONE HCL 200 MG PO TABS
200.0000 mg | ORAL_TABLET | Freq: Every day | ORAL | Status: DC
  Administered 2019-11-29 – 2019-12-01 (×3): 200 mg via ORAL
  Filled 2019-11-29 (×3): qty 1

## 2019-11-29 MED ORDER — LORAZEPAM 2 MG/ML IJ SOLN
0.5000 mg | Freq: Once | INTRAMUSCULAR | Status: AC
  Administered 2019-11-29: 0.5 mg via INTRAVENOUS
  Filled 2019-11-29: qty 1

## 2019-11-29 MED ORDER — METOLAZONE 5 MG PO TABS
2.5000 mg | ORAL_TABLET | Freq: Once | ORAL | Status: AC
  Administered 2019-11-29: 2.5 mg via ORAL
  Filled 2019-11-29: qty 1

## 2019-11-29 NOTE — Progress Notes (Signed)
Progress Note  Patient Name: [name] [name] Date of Encounter: 11/29/2019  Primary Cardiologist: Carlyle Dolly, MD   Subjective   No chest pain. Recorded net positive overnight. BP remains soft - diuretics are currently on hold.  Inpatient Medications    Scheduled Meds: . amiodarone  200 mg Oral Daily  . aspirin EC  81 mg Oral Daily  . enoxaparin (LOVENOX) injection  1 mg/kg Subcutaneous Q24H  . insulin aspart  0-9 Units Subcutaneous TID WC  . insulin detemir  30 Units Subcutaneous QHS  . isosorbide dinitrate  20 mg Oral Daily  . pantoprazole  40 mg Oral Daily  . rosuvastatin  40 mg Oral Daily  . tamsulosin  0.4 mg Oral Daily   Continuous Infusions:  PRN Meds: acetaminophen, alum & mag hydroxide-simeth, metoprolol tartrate, nitroGLYCERIN, polyethylene glycol, senna-docusate, zolpidem   Vital Signs    Vitals:   11/28/19 2021 11/29/19 0100 11/29/19 0310 11/29/19 0757  BP: 92/63 101/75 94/72 103/74  Pulse: 92  81 100  Resp: 17 20 20 20   Temp: 97.6 F (36.4 C) (!) 97.5 F (36.4 C) (!) 97.5 F (36.4 C) 97.9 F (36.6 C)  TempSrc: Oral Oral Oral Oral  SpO2: 99% 96% 96% 99%  Weight:   120.9 kg   Height:        Intake/Output Summary (Last 24 hours) at 11/29/2019 1028 Last data filed at 11/29/2019 0500 Gross per 24 hour  Intake 1769.98 ml  Output 200 ml  Net 1569.98 ml   Last 3 Weights 11/29/2019 11/28/2019 11/27/2019  Weight (lbs) 266 lb 8.6 oz 264 lb 8.8 oz 263 lb 14.4 oz  Weight (kg) 120.9 kg 120 kg 119.704 kg      Telemetry    Sinus rhythm with rates in the 80's to low 100's.  - Personally Reviewed  ECG    No new ECG tracing today. - Personally Reviewed  Physical Exam   GEN: No acute distress.   Neck: No JVD Cardiac: Borderline tachycardic but regular rhythm. No murmurs, rubs, or gallops.  Respiratory: Clear to auscultation bilaterally. No wheezes, rhonchi, or rales.  GI: Soft, non-distended, and non-tender. Bowel sounds present. MS: 2+ lower extremity  edema. Legs wrapped. No deformity. Skin: Warm and dry. Neuro:  No focal deficits. Psych: Normal affect. Responds appropriately.   Labs    High Sensitivity Troponin:   Recent Labs  Lab 11/26/19 0120 11/26/19 0330 11/26/19 0621 11/26/19 0808 11/27/19 2348  TROPONINIHS 22* 21* 18* 16 16      Chemistry Recent Labs  Lab 11/27/19 0058 11/28/19 0257 11/29/19 0718  NA 135 133* 134*  K 3.2* 3.2* 4.4  CL 84* 83* 83*  CO2 35* 32 35*  GLUCOSE 174* 99 65*  BUN 53* 53* 55*  CREATININE 2.53* 2.52* 2.83*  CALCIUM 8.6* 8.9 9.0  PROT 6.9 7.4 7.1  ALBUMIN 2.9* 3.0* 2.9*  AST 20 23 37  ALT 12 14 20   ALKPHOS 110 117 120  BILITOT 1.7* 1.3* 1.5*  GFRNONAA 25* 25* 22*  GFRAA 29* 29* 25*  ANIONGAP 16* 18* 16*     Hematology Recent Labs  Lab 11/27/19 0058 11/28/19 0257 11/29/19 0718  WBC 6.4 8.0 8.2  RBC 4.17* 4.63 4.44  HGB 12.7* 13.9 13.2  HCT 40.0 44.5 43.1  MCV 95.9 96.1 97.1  MCH 30.5 30.0 29.7  MCHC 31.8 31.2 30.6  RDW 16.7* 16.9* 17.1*  PLT 291 330 354    BNP Recent Labs  Lab 11/27/19 0058 11/28/19  0257 11/29/19 0718  BNP 1,036.2* 1,167.5* 872.9*     DDimer No results for input(s): DDIMER in the last 168 hours.   Radiology    No results found.  Cardiac Studies   Echocardiogram 11/26/2019: Impressions: 1. Left ventricular ejection fraction, by estimation, is <20%. The left  ventricle has severely decreased function. The left ventricle demonstrates  global hypokinesis. The left ventricular internal cavity size was  moderately to severely dilated. Left  ventricular diastolic function could not be evaluated.  2. Right ventricular systolic function is moderately reduced. The right  ventricular size is moderately enlarged. There is moderately elevated  pulmonary artery systolic pressure.  3. Left atrial size was moderately dilated.  4. The mitral valve is normal in structure. Mild mitral valve  regurgitation. No evidence of mitral stenosis.  5. The  aortic valve has been repaired/replaced. Aortic valve  regurgitation is not visualized. There is a 29 mm Edwards Sapien  prosthetic (TAVR) valve present in the aortic position. Procedure Date:  01/04/2018.  6. Mild pulmonic stenosis.  7. The inferior vena cava is dilated in size with <50% respiratory  variability, suggesting right atrial pressure of 15 mmHg.  Patient Profile     [name] is a 69 y.o. male with a history of CAD s/p CABG x6 in 2001, ischemic cardiomyopathy with EF of 15-20% s/p ICD in 04/2016, paroxysmal atrial fibrillation on Xarelto, LBBB, aortic stenosis s/p TAVR in 10/2017, hypertension, hyperlipidemia, type 2 diabetes mellitus, CKD stage III, and obesity who is being seen for evaluation of CHF at the request of Dr. Dayna Barker.   Assessment & Plan    Acute on Chronic Systolic CHF/Ischemic Cardiomyopathy - Patient presented with chest pain and shortness of breath with orthopnea and significant lower extremity edema despite PCP increasing home Torsemide.  - BNP trending up over the last couple of days 882 >> 1,036 >> 1,167.  - Chest x-ray showed cardiomegaly with small bilateral pleural effusion but no overt edema.  - Echo this admission shows LVEF of <20% with global hypokinesis. RV also moderately enlarged with moderately reduced systolic function. Moderately elevated PASP. - Patient has significant lower extremity edema with weeping of legs but lungs are clear.  - Lasix being held due to rising creatinine and soft BP - Holding spironolactone as well - Home Coreg discontinued due to soft BP. - No ACEi/ARB/Enresto due to soft BP and renal function. - Continue leg wraps/compression stockings for lower extremity edema.  - Monitor daily weights, strict I/O's, and renal function.  Chest Pain with Known CAD - History of CAD s/p CABG in 2001. Most recent cath in 09/2017 showed patent grafts.  - EKG shows sinus tachycardia with known LBBB. - High-sensitivity initially minimally  elevated but have normalized (22 >> 21 >> 18 >> 16 >> 16). Not consistent with ACS. - Echo as above. - Currently chest pain free - Continue aspirin, beta-blocker, and statin.   Aortic Stenosis s/p TAVR in 10/2017 - Echo this admission showed no AI. Mean gradient 4.0 mmHg and peak gradient 7.7 mmHg. Aortic valve area by VTI 3.69 cm^2.   Paroxysmal Atrial Fibrillation  - Currently in sinus rhythm.  - Home Coreg discontinued due to soft BP. - On oral amiodarone.  - On Xarelto at home. However on lovenox here due to AKI.  Hypertension - BP soft - holding diuretics. - Continue Spironolactone as above. - Home Coreg discontinued due to soft BP.   Hyperlipidemia - Continue Crestor 40mg  daily.   Acute  on Chronic Kidney Disease Stage III - Creatinine rising again - now 2.83. Currently holding diuretics.  - Avoid Nephrotoxic agents. - Continue to monitor.   Hypokalemia - Potassium 4.4 today. - Continue to monitor.  For questions or updates, please contact [place] Please consult [email or web address] for contact info under   Pixie Casino, MD, FACC, [place] Director of the [facility] Diplomate of the American Board of Clinical Lipidology Attending Cardiologist  Direct Dial: [phone]  Fax: [phone]  Website:  [email or web address]  Pixie Casino, MD  11/29/2019, 10:28 AM

## 2019-11-29 NOTE — Consult Note (Signed)
Advanced Heart Failure Team Consult Note   Primary Physician: Kathyrn Drown, MD PCP-Cardiologist:  Carlyle Dolly, MD  Reason for Consultation: A/c chronic systolic HF  HPI:    [name] [name] is seen today for evaluation of HF at the request of Dr. Candiss Norse  Mr. Stitely is a 69 year old gentleman with a past medical history significant for systolic HF due to ischemic cardiomyopathy (EF 15%), CAD s/p CABG x6 in 2001, subsequent PCI's, aortic stenosis s/p TAVR (March 2019), left bundle branch block, paroxysmal atrial fibrillation, [employer] single-chamber ICD implant 05/18/2016, morbid obesity, obstructive sleep apnea (not on CPAP), hypertension, hyperlipidemia, chronic kidney disease and type 2 diabetes mellitus.   The patient had a 6 vessel CABG 12/23/1999 - LIMA to LAD, SVG to ramus, sequential SVG to OM2-RPL, sequential SVG to AM-PDA.  He is also s/p TAVR on 11/20/2017 for aortic stenosis with a 29 mm Edwards SAPIEN 3 valve.  Apparently he developed a LBBB subsequent to that.  He also has a history of paroxysmal atrial fibrillation with a CHA2DS2-VASc score of 4.  He is currently on Eliquis.  He also had a hospitalization in the setting of Covid pneumonia when he was documented to have ventricular tachycardia.  At that time he was significantly hypokalemic.  He was admitted to the hospital with increasing CP and progressive LE edema and orthopnea. On admit High-sensitivity troponins were 22 and 21.  Blood pressure was 106/76 mmHg with a heart rate of 87 bpm.  His other labs were as follows: Potassium 4.7, BUN 53, creatinine 3.15 (up from 1.5), WBC 7.2, hemoglobin 13.4, hematocrit 44.2, and platelets 312.  The INR was 2.0.  The chest x-ray did not reveal any pulmonary edema.  There were small bilateral pleural effusions.  While in the hospital initially responded well to IV lasix. Creatinine improved 3.15 -> 2.5 but over past 24-48 hours weight has started to go back up and creatinine back to  2.8. Says he is feeling better. Denies current SOB, orthopnea or PND. Says legs are down. At baseline says he can get around pretty well at home. Seems to be NYHA III.    Review of Systems: [y] = yes, _0  = no   . General: Weight gain [ y]; Weight loss _1 ; Anorexia _2 ; Fatigue Blue.Reese ]; Fever _3 ; Chills _4 ; Weakness _5   . Cardiac: Chest pain/pressure _6 ; Resting SOB Blue.Reese ]; Exertional SOB _7 ; Orthopnea [ y]; Pedal Edema [ y]; Palpitations _8 ; Syncope _9 ; Presyncope _10 ; Paroxysmal nocturnal dyspnea_11   . Pulmonary: Cough _12 ; Wheezing_13 ; Hemoptysis_14 ; Sputum _15 ; Snoring _16   . GI: Vomiting_17 ; Dysphagia_18 ; Melena_19 ; Hematochezia _20 ; Heartburn_21 ; Abdominal pain _22 ; Constipation _23 ; Diarrhea _24 ; BRBPR _25   . GU: Hematuria_26 ; Dysuria _27 ; Nocturia[y ]  . Vascular: Pain in legs with walking _28 ; Pain in feet with lying flat _29 ; Non-healing sores _30 ; Stroke _31 ; TIA _32 ; Slurred speech _33 ;  . Neuro: Headaches_34 ; Vertigo_35 ; Seizures_36 ; Paresthesias_37 ;Blurred vision _38 ; Diplopia _39 ; Vision changes _40   . Ortho/Skin: Arthritis Blue.Reese ]; Joint pain Blue.Reese ]; Muscle pain _41 ; Joint swelling _42 ; Back Pain _43 ; Rash _44   . Psych: Depression_45 ; Anxiety_46   . Heme: Bleeding problems _47 ; Clotting disorders _48 ; Anemia _49   . Endocrine: Diabetes Blue.Reese ];  Thyroid dysfunction_0   Home Medications Prior to Admission medications   Medication Sig Start Date End Date Taking? Authorizing Provider  acetaminophen (TYLENOL) 500 MG tablet Take 1 tablet (500 mg total) by mouth 2 (two) times daily. Patient taking differently: Take 1,000 mg by mouth every 8 (eight) hours as needed for headache.  10/29/18  Yes Kathyrn Drown, MD  calcium carbonate (TUMS - DOSED IN MG ELEMENTAL CALCIUM) 500 MG chewable tablet Chew 2 tablets by mouth 2 (two) times daily as needed for indigestion or heartburn.    Yes [provider]  carvedilol (COREG) 3.125 MG tablet Take 1 tablet (3.125 mg total) by mouth 2 (two)  times daily with a meal. 11/12/19  Yes Luking, Scott A, MD  FIBER PO Take 5 capsules by mouth daily.   Yes [provider]  HYDROcodone-acetaminophen (NORCO) 10-325 MG tablet Take one tablet po QID prn pain Patient taking differently: Take 1 tablet by mouth every 6 (six) hours as needed for moderate pain. Take one tablet po QID prn pain 10/24/19  Yes Luking, Scott A, MD  insulin aspart (NOVOLOG FLEXPEN) 100 UNIT/ML FlexPen Inject 3 Units into the skin 3 (three) times daily with meals. Patient taking differently: Inject 20 Units into the skin 3 (three) times daily with meals.  08/28/19  Yes Eugenie Filler, MD  Insulin Detemir (LEVEMIR FLEXTOUCH) 100 UNIT/ML Pen Inject 40 Units into the skin at bedtime. 08/28/19  Yes Eugenie Filler, MD  isosorbide dinitrate (ISORDIL) 30 MG tablet Take 1 tablet (30 mg total) by mouth daily. 08/28/19  Yes Eugenie Filler, MD  linaclotide (LINZESS) 290 MCG CAPS capsule TAKE (1) CAPSULE BY MOUTH EACH MORNING BEFORE BREAKFAST. Patient taking differently: Take 290 mcg by mouth daily before breakfast.  09/01/19  Yes Luking, Elayne Snare, MD  Menthol-Methyl Salicylate (GOODSENSE MUSCLE RUB) 8-30 % CREA Apply 1 application topically daily as needed (muscle pain).   Yes [provider]  Multiple Vitamins-Minerals (CENTRUM SILVER PO) Take 1 tablet by mouth daily.    Yes [provider]  nitroGLYCERIN (NITROSTAT) 0.4 MG SL tablet PLACE 1 TABLET UNDER TONGUE FOR CHEST PAIN. MAY REPEAT EVERY 5 MIN UP TO 3 DOSES-NO RELIEF,CALL 911. Patient taking differently: Place 0.4 mg under the tongue every 5 (five) minutes as needed for chest pain.  11/03/19  Yes Sherren Mocha, MD  potassium chloride SA (KLOR-CON M20) 20 MEQ tablet Take 2 tablets (40 mEq total) by mouth 2 (two) times daily. 08/28/19  Yes Eugenie Filler, MD  rivaroxaban (XARELTO) 20 MG TABS tablet Take 1 tablet (20 mg total) by mouth daily with supper. 05/23/19  Yes Arnoldo Lenis, MD    rosuvastatin (CRESTOR) 40 MG tablet TAKE ONE TABLET BY MOUTH DAILY. Patient taking differently: Take 40 mg by mouth daily.  07/18/19  Yes Kathyrn Drown, MD  tamsulosin (FLOMAX) 0.4 MG CAPS capsule Take 1 capsule (0.4 mg total) by mouth daily. 09/01/19  Yes Kathyrn Drown, MD  torsemide (DEMADEX) 20 MG tablet Take 3 tablets po in the morning and 3 tablets po at 2 p.m. 10/31/19  Yes Luking, Elayne Snare, MD  traMADol (ULTRAM) 50 MG tablet TAKE 1 TABLET BY MOUTH EVERY 6 TO 8 HOURS AS NEEDED FOR HEADACHE. Patient taking differently: Take 50 mg by mouth every 6 (six) hours as needed (Headache).  10/14/19  Yes Kathyrn Drown, MD  trolamine salicylate (ASPERCREME) 10 % cream Apply 1 application topically 2 (two) times daily as needed for  muscle pain.    Yes [provider]  blood glucose meter kit and supplies KIT Dispense based on patient and insurance preference. Test  up to four times daily as directed.Dx: E11.9 06/30/19   Kathyrn Drown, MD  metolazone (ZAROXOLYN) 2.5 MG tablet TAKE ONE TABLET BY MOUTH DAILY. Patient not taking: Reported on 11/26/2019 11/19/19   Arnoldo Lenis, MD  SURE COMFORT PEN NEEDLES 31G X 8 MM MISC USING 4 TIMES DAILY. 04/07/19   Kathyrn Drown, MD    Past Medical History: Past Medical History:  Diagnosis Date  . AICD (automatic cardioverter/defibrillator) present 05/18/2016  . Anginal pain ([place])    occ none recent  . Aortic stenosis   . Arthritis   . CAD (coronary artery disease)   . Cardiomyopathy, ischemic   . CHF (congestive heart failure) ([place])   . Chronic back pain   . Diabetes mellitus without complication ([place])   . Elevated PSA   . History of kidney stones   . Hyperlipidemia   . Morbid obesity ([place])   . Myocardial infarction ([place])   . OSA (obstructive sleep apnea)    does not wear CPAP, -order run out told need to redo  test  . Paroxysmal atrial fibrillation ([place]) 09/29/2018   This was found on event monitor October 2019  . S/P CABG x 6 12/23/1999    LIMA to LAD, SVG to ramus, sequential SVG to OM2-RPL, sequential SVG to AM-PDA  . 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   29 mm Edwards Sapien 3 transcatheter heart valve placed via percutaneous right transfemoral approach     Past Surgical History: Past Surgical History:  Procedure Laterality Date  . APPENDECTOMY    . BIOPSY  10/02/2018   Procedure: BIOPSY;  Surgeon: Daneil Dolin, MD;  [facility];  Service: Endoscopy;;  gastric  . CARDIAC CATHETERIZATION     stents  . CAROTID STENT     denies carotid stent  . CATARACT EXTRACTION, BILATERAL    . CORONARY ARTERY BYPASS GRAFT  12/23/1999   LIMA to LAD, SVG to ramus, sequential SVG to OM2-RPL, sequential SVG to AM-PDA  . EP IMPLANTABLE DEVICE N/A 05/18/2016   Procedure: ICD Implant;  Surgeon: Will Meredith Leeds, MD;  Location: [place] [facility];  Service: Cardiovascular;  Laterality: N/A;  . ESOPHAGOGASTRODUODENOSCOPY (EGD) WITH PROPOFOL N/A 10/02/2018   Procedure: ESOPHAGOGASTRODUODENOSCOPY (EGD) WITH PROPOFOL;  Surgeon: Daneil Dolin, MD; Mild Schatzki ring, small hiatal hernia, nonbleeding cratered gastric ulcer with no stigmata of bleeding, erosive gastropathy s/p biopsy, normal duodenum.  Findings suspicious for occult NSAID use.  Pathology with reactive gastropathy, no H. pylori.  . EYE SURGERY    . ingrown toenail N/A   . LEFT HEART CATHETERIZATION WITH CORONARY/GRAFT ANGIOGRAM N/A 12/21/2014   Procedure: LEFT HEART CATHETERIZATION WITH Beatrix Fetters;  Surgeon: Belva Crome, MD;  Location[facility] C[facility] LAB;  Service: Cardiovascular;  Laterality: N/A;  . NASAL SEPTOPLASTY W/ TURBINOPLASTY Bilateral 04/25/2017   NASAL SEPTOPLASTY WITH TURBINATE REDUCTION/notes 04/25/2017  . NASAL SEPTOPLASTY W/ TURBINOPLASTY Bilateral 04/25/2017   Procedure: NASAL SEPTOPLASTY WITH TURBINATE REDUCTION;  Surgeon: Leta Baptist, MD;  Location: [facility];  Service: ENT;  Laterality: Bilateral;  . RIGHT/LEFT HEART CATH  AND CORONARY/GRAFT ANGIOGRAPHY N/A 10/05/2017   Procedure: RIGHT/LEFT HEART CATH AND CORONARY/GRAFT ANGIOGRAPHY;  Surgeon: Sherren Mocha, MD;  Location: [place] [facility];  Service: Cardiovascular;  Laterality: N/A;  . TEE WITHOUT CARDIOVERSION N/A 11/20/2017   Procedure:  TRANSESOPHAGEAL ECHOCARDIOGRAM (TEE);  Surgeon: Sherren Mocha, MD;  Location: [facility];  Service: Open Heart Surgery;  Laterality: N/A;  . TONSILLECTOMY    . TRANSCATHETER AORTIC VALVE REPLACEMENT, TRANSFEMORAL N/A 11/20/2017   Procedure: TRANSCATHETER AORTIC VALVE REPLACEMENT, TRANSFEMORAL;  Surgeon: Sherren Mocha, MD;  Location: [place];  Service: Open Heart Surgery;  Laterality: N/A;    Family History: Family History  Adopted: Yes  Problem Relation Age of Onset  . Diabetes Mother   . CAD Mother   . Colon cancer Neg Hx   . Colon polyps Neg Hx     Social History: Social History   Socioeconomic History  . Marital status: Married    Spouse name: Not on file  . Number of children: Not on file  . Years of education: Not on file  . Highest education level: Not on file  Occupational History  . Occupation: retired    Comment: [place]    Comment: [employer]  Tobacco Use  . Smoking status: Former Smoker    Quit date: 08/29/1999    Years since quitting: 20.2  . Smokeless tobacco: Never Used  . Tobacco comment: couldn't remember date  Substance and Sexual Activity  . Alcohol use: No    Alcohol/week: 0.0 standard drinks  . Drug use: No  . Sexual activity: Not Currently  Other Topics Concern  . Not on file  Social History Narrative  . Not on file   Social Determinants of Health   Financial Resource Strain:   . Difficulty of Paying Living Expenses:   Food Insecurity:   . Worried About Charity fundraiser in the Last Year:   . Arboriculturist in the Last Year:   Transportation Needs:   . Film/video editor (Medical):   Marland Kitchen Lack of Transportation (Non-Medical):   Physical Activity:   . Days of  Exercise per Week:   . Minutes of Exercise per Session:   Stress:   . Feeling of Stress :   Social Connections:   . Frequency of Communication with Friends and Family:   . Frequency of Social Gatherings with Friends and Family:   . Attends Religious Services:   . Active Member of Clubs or Organizations:   . Attends Archivist Meetings:   Marland Kitchen Marital Status:     Allergies:  Allergies  Allergen Reactions  . Dyflex-G [Dyphylline-Guaifenesin] Hives  . Eliquis [Apixaban] Other (See Comments)    Side effects not a true allergy- he stated that it made him feel bad    Objective:    Vital Signs:   Temp:  [97.5 F (36.4 C)-97.9 F (36.6 C)] 97.9 F (36.6 C) (04/03 0757) Pulse Rate:  [79-100] 100 (04/03 0757) Resp:  [17-20] 20 (04/03 0757) BP: (91-103)/(63-75) 103/74 (04/03 0757) SpO2:  [95 %-99 %] 99 % (04/03 0757) Weight:  [120.9 kg] 120.9 kg (04/03 0310) Last BM Date: 11/28/19  Weight change: Filed Weights   11/27/19 0640 11/28/19 0536 11/29/19 0310  Weight: 119.7 kg 120 kg 120.9 kg    Intake/Output:   Intake/Output Summary (Last 24 hours) at 11/29/2019 1550 Last data filed at 11/29/2019 0500 Gross per 24 hour  Intake 1209.98 ml  Output 200 ml  Net 1009.98 ml      Physical Exam    General:  Lying flat in bed No resp difficulty HEENT: normal Neck: supple. JVP hard to see. Carotids 2+ bilat; no bruits. No lymphadenopathy or thyromegaly appreciated. Cor: PMI nondisplaced. Regular tachy . +  s3 Lungs: clear Abdomen: obese soft, nontender, nondistended. No hepatosplenomegaly. No bruits or masses. Good bowel sounds. Extremities: no cyanosis, clubbing, rash, 2+ edema into thighs + UNNA Neuro: alert & orientedx3, cranial nerves grossly intact. moves all 4 extremities w/o difficulty. Affect pleasant   Telemetry   Sinus 100-110 Personally reviewed  EKG    Sinus tach 105 with LBBB (155m) Personally reviewed   Labs   Basic Metabolic Panel: Recent Labs  Lab  11/26/19 0120 11/26/19 0120 11/27/19 0058 11/28/19 0257 11/29/19 0718  NA 133*  --  135 133* 134*  K 4.7  --  3.2* 3.2* 4.4  CL 81*  --  84* 83* 83*  CO2 42*  --  35* 32 35*  GLUCOSE 149*  --  174* 99 65*  BUN 53*  --  53* 53* 55*  CREATININE 3.15*  --  2.53* 2.52* 2.83*  CALCIUM 8.7*   < > 8.6* 8.9 9.0  MG  --   --  2.6* 2.7* 3.1*   < > = values in this interval not displayed.    Liver Function Tests: Recent Labs  Lab 11/27/19 0058 11/28/19 0257 11/29/19 0718  AST 20 23 37  ALT _0 ALKPHOS 110 117 120  BILITOT 1.7* 1.3* 1.5*  PROT 6.9 7.4 7.1  ALBUMIN 2.9* 3.0* 2.9*   No results for input(s): LIPASE, AMYLASE in the last 168 hours. No results for input(s): AMMONIA in the last 168 hours.  CBC: Recent Labs  Lab 11/26/19 0120 11/27/19 0058 11/28/19 0257 11/29/19 0718  WBC 7.2 6.4 8.0 8.2  NEUTROABS  --   --   --  5.5  HGB 13.4 12.7* 13.9 13.2  HCT 44.2 40.0 44.5 43.1  MCV 99.8 95.9 96.1 97.1  PLT 312 291 330 354    Cardiac Enzymes: No results for input(s): CKTOTAL, CKMB, CKMBINDEX, TROPONINI in the last 168 hours.  BNP: BNP (last 3 results) Recent Labs    11/27/19 0058 11/28/19 0257 11/29/19 0718  BNP 1,036.2* 1,167.5* 872.9*    ProBNP (last 3 results) No results for input(s): PROBNP in the last 8760 hours.   CBG: Recent Labs  Lab 11/28/19 1217 11/28/19 1619 11/28/19 2109 11/29/19 0754 11/29/19 1148  GLUCAP 117* 80 130* 67* 97    Coagulation Studies: No results for input(s): LABPROT, INR in the last 72 hours.   Imaging    No results found.   Medications:     Current Medications: . amiodarone  200 mg Oral Daily  . aspirin EC  81 mg Oral Daily  . enoxaparin (LOVENOX) injection  1 mg/kg Subcutaneous Q24H  . insulin aspart  0-9 Units Subcutaneous TID WC  . insulin detemir  30 Units Subcutaneous QHS  . isosorbide dinitrate  20 mg Oral Daily  . metolazone  2.5 mg Oral Once  . pantoprazole  40 mg Oral Daily  . rosuvastatin   40 mg Oral Daily  . tamsulosin  0.4 mg Oral Daily     Infusions: . furosemide         Assessment/Plan   1. Acute on chronic systolic HF - due to longstanding  iCM  - echo 7/20 EF 15-20% moderate RV dysfunction - echo 11/16/19 EF 10-15% severe RV dysfunction - Reports baseline NYHA III symptoms - suspect low output HF with marked volume overload - will give lasix 120 IV today. If no response will move to ICU tomorrow for central access and possible swan for invasive hemodynamics - Likely not  VAD candidate with severe RV failure - hold b-blocker, ARB/ARNI with low output and AKI - probably too advanced to benefit from CRT  2. CAD s/p CABG 2001 - admitted with CP. hstrop negative  - cath 2/19 with patent grafts - not candidate for repeat cath with AKI  3. AS s/p TAVR - stable  4. Acute on chronic CKD 3b - recent baseline 1.3-1.5 - now 2.83/ likely cardiorenal   5. DM2 - per primary  6. PAF   - in NSR on amio  - On xarelto at home. On lovenox here  Length of Stay: 3  [name], MD  11/29/2019, 3:50 PM  Advanced Heart Failure Team Pager [phone] (M-F; [place])  Please contact [facility] for night-coverage after hours (4p -7a ) and weekends on [email or web address]

## 2019-11-29 NOTE — Progress Notes (Signed)
PROGRESS NOTE                                                                                                                                                                                                             Patient Demographics:    [name] [name], is a 69 y.o. male, DOB - 11-18-1950, HDQ:[id number]  Admit date - 11/26/2019   Admitting Physician Chauncey Mann, MD  Outpatient Primary MD for the patient is Kathyrn Drown, MD  LOS - 3  Chief Complaint  Patient presents with  . Chest Pain       Brief Narrative  69 year old Caucasian male who was admitted few hours ago with symptoms of CHF with known past medical history of ischemic cardiomyopathy with chronic systolic heart failure EF of 15%, s/p CABG, aortic stenosis s/p TAVR, paroxysmal A. fib [place] vas 2 score of greater than 3, hypertension, dyslipidemia, morbid obesity, type 2 diabetes, CKD 3 who presented to the hospital with chief complaints of chest pain and shortness of breath along with leg edema.   Subjective:   Patient in bed, appears comfortable, denies any headache, no fever, no chest pain or pressure, no shortness of breath , no abdominal pain. No focal weakness but does feel weak and tired all over, very sleepy this morning.   Assessment  & Plan :     1.  Acute on chronic systolic heart failure in a patient with EF less than 20% s/p TAVR for AS with reduced right-sided RV systolic function has AICD.  Patient has combination of both left and right-sided heart failure (this admission appears to be predominantly right-sided heart failure in nature due to more peripheral edema than lung involvement) , has massive leg edema and relatively spared lungs, he has been aggressively diuresed with IV Lasix, Zaroxolyn and Aldactone combination, his weight has dropped about 11 pounds from the time of admission, now hypotensive and orthostatic with AKI and further diuretics on hold on 11/29/2019.  Blood pressure too low  to tolerate beta-blocker or any other blood pressure medication at this time, renal failure prevents the use of ACE/ARB or Entresto etc. we will request heart failure team to see him as well.  2.  Hypokalemia.  Replaced and stable.  3.  Paroxysmal atrial fibrillation.  Mali vas 2 score of at least 4.  Was in RVR with hypotension morning of 11/27/2019, blood pressure too low to tolerate beta-blocker calcium channel blockers, and placed him on IV amiodarone drip for  2 days and now in good rate control, switch to oral amiodarone, continue Lovenox for anticoagulation as tolerated.  4.  AKI on CKD 4 - improving with diuresis monitor.  Creatinine is anywhere between 1.5 and 2.5.  Currently certainly has AKI in the setting of hypotension and CHF.  For now holding further diuretics and nephrotoxins.  5.  History of AS and TAVR procedure.  Supportive care.  6.  CAD with underlying ischemic cardiomyopathy s/p CABG.  No acute issues continue combination of aspirin, Imdur and statin for secondary prevention.  Once blood pressure improves low-dose beta-blocker if tolerated.  7. OSA - ? Patient refused outpt Study, undiagnosed.  Counseled patient advised to get another sleep study.  8.  Orthostatic hypotension.  Hold further diuretics, TED stockings/ UNNA  and monitor.      9. DM2 - on Lantus + ISS, Lantus dose dropped on 11/29/2019 to avoid early morning hypoglycemia.  Lab Results  Component Value Date   HGBA1C 7.2 (H) 11/26/2019    CBG (last 3)  Recent Labs    11/28/19 1619 11/28/19 2109 11/29/19 0754  GLUCAP 80 130* 67*     He has refused working with PT and remains extremely argumentative with all staff members    Family Communication  : Wife Santiago Glad on 11/27/2019 [phone]    Code Status :  Full  Disposition Plan  : Stay in the hospital for treatment of severe CHF exacerbation along with A. fib RVR  Consults  : Cardiology  Procedures  :    Echocardiogram -   1. Left ventricular  ejection fraction, by estimation, is <20%. The left ventricle has severely decreased function. The left ventricle demonstrates  global hypokinesis. The left ventricular internal cavity size was moderately to severely dilated. Left ventricular diastolic function could not be evaluated.  2. Right ventricular systolic function is moderately reduced. The right ventricular size is moderately enlarged. There is moderately elevated  pulmonary artery systolic pressure.  3. Left atrial size was moderately dilated.  4. The mitral valve is normal in structure. Mild mitral valve regurgitation. No evidence of mitral stenosis.  5. The aortic valve has been repaired/replaced. Aortic valve regurgitation is not visualized. There is a 29 mm Edwards Sapien prosthetic (TAVR) valve present in the aortic position. Procedure Date: 01/04/2018.  6. Mild pulmonic stenosis.  7. The inferior vena cava is dilated in size with <50% respiratory variability, suggesting right atrial pressure of 15 mmHg.    DVT Prophylaxis  :  Lovenox   Lab Results  Component Value Date   PLT 354 11/29/2019    Diet :  Diet Order            Diet 2 gram sodium Room service appropriate? Yes; Fluid consistency: Thin; Fluid restriction: 1200 mL Fluid  Diet effective now               Inpatient Medications Scheduled Meds: . amiodarone  200 mg Oral Daily  . aspirin EC  81 mg Oral Daily  . enoxaparin (LOVENOX) injection  1 mg/kg Subcutaneous Q24H  . insulin aspart  0-9 Units Subcutaneous TID WC  . insulin detemir  30 Units Subcutaneous QHS  . isosorbide dinitrate  20 mg Oral Daily  . LORazepam  0.5 mg Intravenous Once  . pantoprazole  40 mg Oral Daily  . rosuvastatin  40 mg Oral Daily  . tamsulosin  0.4 mg Oral Daily   Continuous Infusions:  PRN Meds:.acetaminophen, alum & mag hydroxide-simeth, metoprolol tartrate,  nitroGLYCERIN, polyethylene glycol, senna-docusate, zolpidem  Antibiotics  :   Anti-infectives (From  admission, onward)   None       Objective:   Vitals:   11/28/19 2021 11/29/19 0100 11/29/19 0310 11/29/19 0757  BP: 92/63 101/75 94/72 103/74  Pulse: 92  81 100  Resp: 17 20 20 20   Temp: 97.6 F (36.4 C) (!) 97.5 F (36.4 C) (!) 97.5 F (36.4 C) 97.9 F (36.6 C)  TempSrc: Oral Oral Oral Oral  SpO2: 99% 96% 96% 99%  Weight:   120.9 kg   Height:        SpO2: 99 % O2 Flow Rate (L/min): 2 L/min  Wt Readings from Last 3 Encounters:  11/29/19 120.9 kg  10/24/19 118.8 kg  10/17/19 119.3 kg     Intake/Output Summary (Last 24 hours) at 11/29/2019 0948 Last data filed at 11/29/2019 0500 Gross per 24 hour  Intake 1769.98 ml  Output 200 ml  Net 1569.98 ml     Physical Exam  Awake Alert, No new F.N deficits, +ve aortic systolic murmur, irregular rhythm, 1-2 + leg edema with Unna boots [place]rey.AT,PERRAL Supple Neck,No JVD, No cervical lymphadenopathy appriciated.  Symmetrical Chest wall movement, Good air movement bilaterally, CTAB iRRR,No Gallops,   No Parasternal Heave +ve B.Sounds, Abd Soft, No tenderness, No organomegaly appriciated, No rebound - guarding or rigidity. No Cyanosis,       Data Review:    Recent Labs  Lab 11/26/19 0120 11/27/19 0058 11/28/19 0257 11/29/19 0718  WBC 7.2 6.4 8.0 8.2  HGB 13.4 12.7* 13.9 13.2  HCT 44.2 40.0 44.5 43.1  PLT 312 291 330 354  MCV 99.8 95.9 96.1 97.1  MCH 30.2 30.5 30.0 29.7  MCHC 30.3 31.8 31.2 30.6  RDW 16.8* 16.7* 16.9* 17.1*  LYMPHSABS  --   --   --  1.1  MONOABS  --   --   --  1.6*  EOSABS  --   --   --  0.1  BASOSABS  --   --   --  0.0    Recent Labs  Lab 11/26/19 0120 11/26/19 0450 11/26/19 0605 11/27/19 0058 11/28/19 0257 11/29/19 0718  NA 133*  --   --  135 133* 134*  K 4.7  --   --  3.2* 3.2* 4.4  CL 81*  --   --  84* 83* 83*  CO2 42*  --   --  35* 32 35*  GLUCOSE 149*  --   --  174* 99 65*  BUN 53*  --   --  53* 53* 55*  CREATININE 3.15*  --   --  2.53* 2.52* 2.83*  CALCIUM 8.7*  --   --  8.6*  8.9 9.0  AST  --   --   --  20 23 37  ALT  --   --   --  12 14 20   ALKPHOS  --   --   --  110 117 120  BILITOT  --   --   --  1.7* 1.3* 1.5*  ALBUMIN  --   --   --  2.9* 3.0* 2.9*  MG  --   --   --  2.6* 2.7* 3.1*  INR 2.0*  --   --   --   --   --   HGBA1C  --   --  7.2*  --   --   --   BNP  --  882.5*  --  1,036.2*  1,167.5* 872.9*    Recent Labs  Lab 11/26/19 0450 11/26/19 0730 11/27/19 0058 11/28/19 0257 11/29/19 0718  BNP 882.5*  --  1,036.2* 1,167.5* 872.9*  SARSCOV2NAA  --  NEGATIVE  --   --   --     ------------------------------------------------------------------------------------------------------------------ No results for input(s): CHOL, HDL, LDLCALC, TRIG, CHOLHDL, LDLDIRECT in the last 72 hours.  Lab Results  Component Value Date   HGBA1C 7.2 (H) 11/26/2019   ------------------------------------------------------------------------------------------------------------------ No results for input(s): TSH, T4TOTAL, T3FREE, THYROIDAB in the last 72 hours.  Invalid input(s): FREET3 ------------------------------------------------------------------------------------------------------------------ No results for input(s): VITAMINB12, FOLATE, FERRITIN, TIBC, IRON, RETICCTPCT in the last 72 hours.  Coagulation profile Recent Labs  Lab 11/26/19 0120  INR 2.0*    No results for input(s): DDIMER in the last 72 hours.  Cardiac Enzymes No results for input(s): CKMB, TROPONINI, MYOGLOBIN in the last 168 hours.  Invalid input(s): CK ------------------------------------------------------------------------------------------------------------------    Component Value Date/Time   BNP 872.9 (H) 11/29/2019 3536    Micro Results Recent Results (from the past 240 hour(s))  SARS CORONAVIRUS 2 (TAT 6-24 HRS) Nasopharyngeal Nasopharyngeal Swab     Status: None   Collection Time: 11/26/19  7:30 AM   Specimen: Nasopharyngeal Swab  Result Value Ref Range Status   SARS  Coronavirus 2 NEGATIVE NEGATIVE Final    Comment: (NOTE) SARS-CoV-2 target nucleic acids are NOT DETECTED. The SARS-CoV-2 RNA is generally detectable in upper and lower respiratory specimens during the acute phase of infection. Negative results do not preclude SARS-CoV-2 infection, do not rule out co-infections with other pathogens, and should not be used as the sole basis for treatment or other patient management decisions. Negative results must be combined with clinical observations, patient history, and epidemiological information. The expected result is Negative. Fact Sheet for Patients: SugarRoll.be Fact Sheet for Healthcare Providers: https://www.woods-mathews.com/ This test is not yet approved or cleared by the [place] FDA and  has been authorized for detection and/or diagnosis of SARS-CoV-2 by FDA under an Emergency Use Authorization (EUA). This EUA will remain  in effect (meaning this test can be used) for the duration of the COVID-19 declaration under Section 56 4(b)(1) of the Act, 21 U.S.C. section 360bbb-3(b)(1), unless the authorization is terminated or revoked sooner. Performed at [facility], [place] [address], [place], [place] [address]     Radiology Reports DG Chest 2 View  Result Date: 11/26/2019 CLINICAL DATA:  Chest pain and shortness of breath EXAM: CHEST - 2 VIEW COMPARISON:  08/25/2019 FINDINGS: Small pleural effusions. Moderate cardiomegaly. Aortic valve replacement. Unchanged position of left chest wall AICD single lead. No focal airspace consolidation or pulmonary edema. IMPRESSION: Cardiomegaly and small bilateral pleural effusions. Electronically Signed   By: Ulyses Jarred M.D.   On: 11/26/2019 01:58   ECHOCARDIOGRAM COMPLETE  Result Date: 11/26/2019    ECHOCARDIOGRAM REPORT   Patient Name:   MANISH RUGGIERO Date of Exam: 11/26/2019 Medical Rec #:  [id number]       Height:       70.0 in Accession #:     [id number]      Weight:       275.6 lb Date of Birth:  [birth year]       BSA:          2.392 m Patient Age:    60 years        BP:           107/75 mmHg Patient Gender: M  HR:           113 bpm. Exam Location:  Inpatient Procedure: 2D Echo and Intracardiac Opacification Agent Indications:    CHF-Acute Systolic 850.27 / X41.28  History:        Patient has prior history of Echocardiogram examinations, most                 recent 03/04/2019. CAD and Previous Myocardial Infarction, Prior                 CABG and Defibrillator, Arrythmias:Atrial Fibrillation; Risk                 Factors:Dyslipidemia, Diabetes and Sleep Apnea. Ischemic                 cardiomyopathy.                 Aortic Valve: 29 mm Edwards Sapien prosthetic, stented (TAVR)                 valve is present in the aortic position. Procedure Date:                 01/04/2018.  Sonographer:    Darlina Sicilian RDCS Referring Phys: [id number] [place]  1. Left ventricular ejection fraction, by estimation, is <20%. The left ventricle has severely decreased function. The left ventricle demonstrates global hypokinesis. The left ventricular internal cavity size was moderately to severely dilated. Left ventricular diastolic function could not be evaluated.  2. Right ventricular systolic function is moderately reduced. The right ventricular size is moderately enlarged. There is moderately elevated pulmonary artery systolic pressure.  3. Left atrial size was moderately dilated.  4. The mitral valve is normal in structure. Mild mitral valve regurgitation. No evidence of mitral stenosis.  5. The aortic valve has been repaired/replaced. Aortic valve regurgitation is not visualized. There is a 29 mm Edwards Sapien prosthetic (TAVR) valve present in the aortic position. Procedure Date: 01/04/2018.  6. Mild pulmonic stenosis.  7. The inferior vena cava is dilated in size with <50% respiratory variability, suggesting right atrial pressure of 15  mmHg. Comparison(s): No significant change from prior study. FINDINGS  Left Ventricle: Left ventricular ejection fraction, by estimation, is <20%. The left ventricle has severely decreased function. The left ventricle demonstrates global hypokinesis. Definity contrast agent was given IV to delineate the left ventricular endocardial borders. The left ventricular internal cavity size was moderately to severely dilated. There is no left ventricular hypertrophy. Left ventricular diastolic function could not be evaluated. Right Ventricle: The right ventricular size is moderately enlarged. No increase in right ventricular wall thickness. Right ventricular systolic function is moderately reduced. There is moderately elevated pulmonary artery systolic pressure. The tricuspid  regurgitant velocity is 3.02 m/s, and with an assumed right atrial pressure of 15 mmHg, the estimated right ventricular systolic pressure is 09.4 mmHg. Left Atrium: Left atrial size was moderately dilated. Right Atrium: Right atrial size was not well visualized. Pericardium: There is no evidence of pericardial effusion. Mitral Valve: The mitral valve is normal in structure. Mild mitral annular calcification. Mild mitral valve regurgitation. No evidence of mitral valve stenosis. Tricuspid Valve: The tricuspid valve is normal in structure. Tricuspid valve regurgitation is mild . No evidence of tricuspid stenosis. Aortic Valve: The aortic valve has been repaired/replaced. Aortic valve regurgitation is not visualized. Aortic valve mean gradient measures 4.0 mmHg. Aortic valve peak gradient measures 7.7 mmHg. Aortic valve area, by VTI measures 3.69 cm.  There is a 29 mm Edwards Sapien prosthetic, stented (TAVR) valve present in the aortic position. Procedure Date: 01/04/2018. Pulmonic Valve: The pulmonic valve was grossly normal. Pulmonic valve regurgitation is mild to moderate. Mild pulmonic stenosis. Aorta: The aortic root and ascending aorta are  structurally normal, with no evidence of dilitation. Venous: The inferior vena cava is dilated in size with less than 50% respiratory variability, suggesting right atrial pressure of 15 mmHg. IAS/Shunts: No atrial level shunt detected by color flow Doppler. Additional Comments: A pacer wire is visualized in the right ventricle and right atrium.  LEFT VENTRICLE PLAX 2D LVIDd:         6.29 cm LVIDs:         5.56 cm LV PW:         1.02 cm LV IVS:        1.05 cm LVOT diam:     2.70 cm LV SV:         85 LV SV Index:   36 LVOT Area:     5.73 cm  LV Volumes (MOD) LV vol d, MOD A2C: 341.0 ml LV vol d, MOD A4C: 256.0 ml LV vol s, MOD A2C: 270.0 ml LV vol s, MOD A4C: 206.0 ml LV SV MOD A2C:     71.0 ml LV SV MOD A4C:     256.0 ml LV SV MOD BP:      62.7 ml RIGHT VENTRICLE TAPSE (M-mode): 1.5 cm LEFT ATRIUM              Index LA diam:        5.50 cm  2.30 cm/m LA Vol (A2C):   110.0 ml 45.99 ml/m LA Vol (A4C):   135.0 ml 56.44 ml/m LA Biplane Vol: 127.0 ml 53.09 ml/m  AORTIC VALVE AV Area (Vmax):    4.34 cm AV Area (Vmean):   4.36 cm AV Area (VTI):     3.69 cm AV Vmax:           138.50 cm/s AV Vmean:          94.050 cm/s AV VTI:            0.231 m AV Peak Grad:      7.7 mmHg AV Mean Grad:      4.0 mmHg LVOT Vmax:         105.00 cm/s LVOT Vmean:        71.600 cm/s LVOT VTI:          0.149 m LVOT/AV VTI ratio: 0.65  AORTA Ao Root diam: 3.20 cm MITRAL VALVE               TRICUSPID VALVE MV Area (PHT): 6.43 cm    TR Peak grad:   36.5 mmHg MV Decel Time: 118 msec    TR Vmax:        302.00 cm/s MV E velocity: 99.15 cm/s                            SHUNTS                            Systemic VTI:  0.15 m                            Systemic Diam: 2.70 cm Buford Dresser MD Electronically signed by Buford Dresser MD Signature  Date/Time: 11/26/2019/4:53:33 PM    Final     Time Spent in minutes  30   Lala Lund M.D on 11/29/2019 at 9:48 AM  To page go to [email or web address] - password [email or web address]

## 2019-11-30 ENCOUNTER — Inpatient Hospital Stay (HOSPITAL_COMMUNITY): Payer: Medicare Other

## 2019-11-30 DIAGNOSIS — R570 Cardiogenic shock: Secondary | ICD-10-CM

## 2019-11-30 LAB — COMPREHENSIVE METABOLIC PANEL
ALT: 19 U/L (ref 0–44)
AST: 34 U/L (ref 15–41)
Albumin: 2.7 g/dL — ABNORMAL LOW (ref 3.5–5.0)
Alkaline Phosphatase: 127 U/L — ABNORMAL HIGH (ref 38–126)
Anion gap: 14 (ref 5–15)
BUN: 55 mg/dL — ABNORMAL HIGH (ref 8–23)
CO2: 34 mmol/L — ABNORMAL HIGH (ref 22–32)
Calcium: 8.7 mg/dL — ABNORMAL LOW (ref 8.9–10.3)
Chloride: 85 mmol/L — ABNORMAL LOW (ref 98–111)
Creatinine, Ser: 2.7 mg/dL — ABNORMAL HIGH (ref 0.61–1.24)
GFR calc Af Amer: 27 mL/min — ABNORMAL LOW (ref >60)
GFR calc non Af Amer: 23 mL/min — ABNORMAL LOW (ref >60)
Glucose, Bld: 68 mg/dL — ABNORMAL LOW (ref 70–99)
Potassium: 3.2 mmol/L — ABNORMAL LOW (ref 3.5–5.1)
Sodium: 133 mmol/L — ABNORMAL LOW (ref 135–145)
Total Bilirubin: 1.5 mg/dL — ABNORMAL HIGH (ref 0.3–1.2)
Total Protein: 6.8 g/dL (ref 6.5–8.1)

## 2019-11-30 LAB — MAGNESIUM: Magnesium: 3.1 mg/dL — ABNORMAL HIGH (ref 1.7–2.4)

## 2019-11-30 LAB — COOXEMETRY PANEL
Carboxyhemoglobin: 1.9 % — ABNORMAL HIGH (ref 0.5–1.5)
Carboxyhemoglobin: 1.9 % — ABNORMAL HIGH (ref 0.5–1.5)
Methemoglobin: 1 % (ref 0.0–1.5)
Methemoglobin: 0.9 % (ref 0.0–1.5)
O2 Saturation: 60.7 %
O2 Saturation: 62.4 %
Total hemoglobin: 12.1 g/dL (ref 12.0–16.0)
Total hemoglobin: 12.4 g/dL (ref 12.0–16.0)

## 2019-11-30 LAB — BRAIN NATRIURETIC PEPTIDE: B Natriuretic Peptide: 911.8 pg/mL — ABNORMAL HIGH (ref 0.0–100.0)

## 2019-11-30 LAB — GLUCOSE, CAPILLARY
Glucose-Capillary: 59 mg/dL — ABNORMAL LOW (ref 70–99)
Glucose-Capillary: 121 mg/dL — ABNORMAL HIGH (ref 70–99)
Glucose-Capillary: 169 mg/dL — ABNORMAL HIGH (ref 70–99)
Glucose-Capillary: 98 mg/dL (ref 70–99)
Glucose-Capillary: 195 mg/dL — ABNORMAL HIGH (ref 70–99)

## 2019-11-30 MED ORDER — MILRINONE LACTATE IN DEXTROSE 20-5 MG/100ML-% IV SOLN
0.1250 ug/kg/min | INTRAVENOUS | Status: DC
  Administered 2019-11-30 – 2019-12-02 (×3): 0.125 ug/kg/min via INTRAVENOUS
  Filled 2019-11-30 (×3): qty 100

## 2019-11-30 MED ORDER — SODIUM CHLORIDE 0.9% FLUSH: 10.0000 mL | INTRAVENOUS | Status: DC | PRN

## 2019-11-30 MED ORDER — MIDAZOLAM HCL 2 MG/2ML IJ SOLN: 0.5000 mg | Freq: Once | INTRAMUSCULAR | Status: AC

## 2019-11-30 MED ORDER — ISOSORBIDE DINITRATE 10 MG PO TABS
10.0000 mg | ORAL_TABLET | Freq: Every day | ORAL | Status: DC
  Administered 2019-11-30 – 2019-12-01 (×2): 10 mg via ORAL
  Filled 2019-11-30 (×2): qty 1

## 2019-11-30 MED ORDER — INSULIN DETEMIR 100 UNIT/ML ~~LOC~~ SOLN
20.0000 [IU] | Freq: Every day | SUBCUTANEOUS | Status: DC
  Administered 2019-11-30 – 2019-12-02 (×3): 20 [IU] via SUBCUTANEOUS
  Filled 2019-11-30 (×5): qty 0.2

## 2019-11-30 MED ORDER — CHLORHEXIDINE GLUCONATE CLOTH 2 % EX PADS
6.0000 | MEDICATED_PAD | Freq: Every day | CUTANEOUS | Status: DC
  Administered 2019-12-03: 6 via TOPICAL

## 2019-11-30 MED ORDER — POTASSIUM CHLORIDE CRYS ER 20 MEQ PO TBCR
40.0000 meq | EXTENDED_RELEASE_TABLET | Freq: Two times a day (BID) | ORAL | Status: DC
  Administered 2019-11-30 (×2): 40 meq via ORAL
  Filled 2019-11-30 (×2): qty 2

## 2019-11-30 MED ORDER — SALINE SPRAY 0.65 % NA SOLN
1.0000 | NASAL | Status: DC | PRN
  Filled 2019-11-30 (×2): qty 44

## 2019-11-30 MED ORDER — MIDAZOLAM HCL 2 MG/2ML IJ SOLN
INTRAMUSCULAR | Status: AC
  Administered 2019-11-30: 0.5 mg via INTRAVENOUS
  Filled 2019-11-30: qty 2

## 2019-11-30 MED ORDER — CHLORHEXIDINE GLUCONATE CLOTH 2 % EX PADS
6.0000 | MEDICATED_PAD | Freq: Every day | CUTANEOUS | Status: DC
  Administered 2019-11-30 – 2019-12-03 (×4): 6 via TOPICAL

## 2019-11-30 MED ORDER — HEPARIN (PORCINE) 25000 UT/250ML-% IV SOLN
1000.0000 [IU]/h | INTRAVENOUS | Status: DC
  Administered 2019-11-30 – 2019-12-01 (×2): 1000 [IU]/h via INTRAVENOUS
  Filled 2019-11-30 (×2): qty 250

## 2019-11-30 MED ORDER — SODIUM CHLORIDE 0.9% FLUSH
10.0000 mL | Freq: Two times a day (BID) | INTRAVENOUS | Status: DC
  Administered 2019-11-30 – 2019-12-03 (×5): 10 mL

## 2019-11-30 NOTE — Progress Notes (Signed)
PROGRESS NOTE                                                                                                                                                                                                             Patient Demographics:    [name] [name], is a 69 y.o. male, DOB - 02/11/1951, TFT:[id number]  Admit date - 11/26/2019   Admitting Physician Chauncey Mann, MD  Outpatient Primary MD for the patient is Kathyrn Drown, MD  LOS - 4  Chief Complaint  Patient presents with  . Chest Pain       Brief Narrative  69 year old Caucasian male who was admitted few hours ago with symptoms of CHF with known past medical history of ischemic cardiomyopathy with chronic systolic heart failure EF of 15%, s/p CABG, aortic stenosis s/p TAVR, paroxysmal A. fib [place] vas 2 score of greater than 3, hypertension, dyslipidemia, morbid obesity, type 2 diabetes, CKD 3 who presented to the hospital with chief complaints of chest pain and shortness of breath along with leg edema.   Subjective:   Patient in bed, appears comfortable, denies any headache, no fever, no chest pain or pressure, +ve shortness of breath , no abdominal pain. No focal weakness, but feels weak all over.   Assessment  & Plan :     1.  Acute on chronic systolic heart failure in a patient with EF less than 20% s/p TAVR for AS with reduced right-sided RV systolic function has AICD.  Patient has combination of both left and right-sided heart failure (this admission appears to be predominantly right-sided heart failure in nature due to more peripheral edema than lung involvement) , has massive leg edema and relatively spared lungs, he has been aggressively diuresed with IV Lasix, Zaroxolyn and Aldactone combination, but now blood pressure is falling and he is developing orthostasis, CHF team following and plan is to move him to ICU on 11/30/2019 and try him on possibly IV amiodarone, he may also get a right heart  catheterization this admission, blood pressure too low to tolerate beta-blocker or any other blood pressure medication at this time, renal failure prevents the use of ACE/ARB or Entresto etc.   2.  Hypokalemia.  Replaced and will monitor closely.  3.  Paroxysmal atrial fibrillation.  Mali vas 2 score of at least 4.  Was in RVR with hypotension morning of 11/27/2019, blood pressure too low to tolerate beta-blocker calcium channel blockers, and placed him on IV amiodarone drip for  2 days and now in good rate control, now has been switched to oral amiodarone continue Lovenox for anticoagulation as tolerated.  4.  AKI on CKD 4 - improving with diuresis monitor.  Creatinine is anywhere between 1.5 and 2.5.  Currently certainly has AKI in the setting of hypotension and CHF.  Closely with diuresis.  5.  History of AS and TAVR procedure.  Supportive care.  6.  CAD with underlying ischemic cardiomyopathy s/p CABG.  No acute issues continue combination of aspirin, Imdur and statin for secondary prevention.  Once blood pressure improves low-dose beta-blocker if tolerated.  7. OSA - ? Patient refused outpt Study, undiagnosed.  Counseled patient advised to get another sleep study.  8.  Orthostatic hypotension.  Hold further diuretics, TED stockings/ UNNA  and monitor.      9. DM2 - on Lantus + ISS, Lantus dose dropped again on 11/30/2019 to avoid early morning hypoglycemia.  Lab Results  Component Value Date   HGBA1C 7.2 (H) 11/26/2019    CBG (last 3)  Recent Labs    11/29/19 2039 11/30/19 0541 11/30/19 0805  GLUCAP 119* 59* 121*     He has refused working with PT and remains extremely argumentative with all staff members    Family Communication  : Wife Santiago Glad on 11/27/2019 - [phone] , 11/30/19 @ 10 am - message left  Code Status :  Full  Disposition Plan  : Stay in the hospital for treatment of severe CHF exacerbation along with A. fib RVR  Consults  : Cardiology  Procedures  :     Echocardiogram -   1. Left ventricular ejection fraction, by estimation, is <20%. The left ventricle has severely decreased function. The left ventricle demonstrates  global hypokinesis. The left ventricular internal cavity size was moderately to severely dilated. Left ventricular diastolic function could not be evaluated.  2. Right ventricular systolic function is moderately reduced. The right ventricular size is moderately enlarged. There is moderately elevated  pulmonary artery systolic pressure.  3. Left atrial size was moderately dilated.  4. The mitral valve is normal in structure. Mild mitral valve regurgitation. No evidence of mitral stenosis.  5. The aortic valve has been repaired/replaced. Aortic valve regurgitation is not visualized. There is a 29 mm Edwards Sapien prosthetic (TAVR) valve present in the aortic position. Procedure Date: 01/04/2018.  6. Mild pulmonic stenosis.  7. The inferior vena cava is dilated in size with <50% respiratory variability, suggesting right atrial pressure of 15 mmHg.    DVT Prophylaxis  :  Lovenox   Lab Results  Component Value Date   PLT 354 11/29/2019    Diet :  Diet Order            Diet 2 gram sodium Room service appropriate? Yes; Fluid consistency: Thin; Fluid restriction: 1200 mL Fluid  Diet effective now               Inpatient Medications Scheduled Meds: . amiodarone  200 mg Oral Daily  . aspirin EC  81 mg Oral Daily  . insulin aspart  0-9 Units Subcutaneous TID WC  . insulin detemir  20 Units Subcutaneous QHS  . isosorbide dinitrate  10 mg Oral Daily  . pantoprazole  40 mg Oral Daily  . potassium chloride  40 mEq Oral BID  . rosuvastatin  40 mg Oral Daily  . tamsulosin  0.4 mg Oral Daily   Continuous Infusions: . furosemide Stopped (11/30/19 0931)   PRN Meds:.acetaminophen, alum & mag  hydroxide-simeth, nitroGLYCERIN, polyethylene glycol, senna-docusate, sodium chloride, zolpidem  Antibiotics  :    Anti-infectives (From admission, onward)   None       Objective:   Vitals:   11/30/19 0219 11/30/19 0258 11/30/19 0449 11/30/19 0810  BP: 104/80 (!) 114/95  116/66  Pulse: 71 97  (!) 115  Resp:    20  Temp:   97.8 F (36.6 C) 97.9 F (36.6 C)  TempSrc:   Oral Oral  SpO2:  100%  95%  Weight:   123.6 kg   Height:        SpO2: 95 % O2 Flow Rate (L/min): 5 L/min  Wt Readings from Last 3 Encounters:  11/30/19 123.6 kg  10/24/19 118.8 kg  10/17/19 119.3 kg     Intake/Output Summary (Last 24 hours) at 11/30/2019 0959 Last data filed at 11/30/2019 0900 Gross per 24 hour  Intake 1029.67 ml  Output 825 ml  Net 204.67 ml     Physical Exam  Awake Alert, No new F.N deficits, +ve aortic systolic murmur, irregular rhythm, 1-2 + leg edema with Unna boots Benson.AT,PERRAL Supple Neck,No JVD, No cervical lymphadenopathy appriciated.  Symmetrical Chest wall movement, Good air movement bilaterally, CTAB RRR,No Gallops, Rubs or new Murmurs, No Parasternal Heave +ve B.Sounds, Abd Soft, No tenderness, No organomegaly appriciated, No rebound - guarding or rigidity. No Cyanosis    Data Review:    Recent Labs  Lab 11/26/19 0120 11/27/19 0058 11/28/19 0257 11/29/19 0718  WBC 7.2 6.4 8.0 8.2  HGB 13.4 12.7* 13.9 13.2  HCT 44.2 40.0 44.5 43.1  PLT 312 291 330 354  MCV 99.8 95.9 96.1 97.1  MCH 30.2 30.5 30.0 29.7  MCHC 30.3 31.8 31.2 30.6  RDW 16.8* 16.7* 16.9* 17.1*  LYMPHSABS  --   --   --  1.1  MONOABS  --   --   --  1.6*  EOSABS  --   --   --  0.1  BASOSABS  --   --   --  0.0    Recent Labs  Lab 11/26/19 0120 11/26/19 0450 11/26/19 0605 11/27/19 0058 11/28/19 0257 11/29/19 0718 11/30/19 0414  NA 133*  --   --  135 133* 134* 133*  K 4.7  --   --  3.2* 3.2* 4.4 3.2*  CL 81*  --   --  84* 83* 83* 85*  CO2 42*  --   --  35* 32 35* 34*  GLUCOSE 149*  --   --  174* 99 65* 68*  BUN 53*  --   --  53* 53* 55* 55*  CREATININE 3.15*  --   --  2.53* 2.52* 2.83* 2.70*   CALCIUM 8.7*  --   --  8.6* 8.9 9.0 8.7*  AST  --   --   --  20 23 37 34  ALT  --   --   --  12 14 20 19   ALKPHOS  --   --   --  110 117 120 127*  BILITOT  --   --   --  1.7* 1.3* 1.5* 1.5*  ALBUMIN  --   --   --  2.9* 3.0* 2.9* 2.7*  MG  --   --   --  2.6* 2.7* 3.1* 3.1*  INR 2.0*  --   --   --   --   --   --   HGBA1C  --   --  7.2*  --   --   --   --  BNP  --  882.5*  --  1,036.2* 1,167.5* 872.9* 911.8*    Recent Labs  Lab 11/26/19 0450 11/26/19 0730 11/27/19 0058 11/28/19 0257 11/29/19 0718 11/30/19 0414  BNP 882.5*  --  1,036.2* 1,167.5* 872.9* 911.8*  SARSCOV2NAA  --  NEGATIVE  --   --   --   --     ------------------------------------------------------------------------------------------------------------------ No results for input(s): CHOL, HDL, LDLCALC, TRIG, CHOLHDL, LDLDIRECT in the last 72 hours.  Lab Results  Component Value Date   HGBA1C 7.2 (H) 11/26/2019   ------------------------------------------------------------------------------------------------------------------ No results for input(s): TSH, T4TOTAL, T3FREE, THYROIDAB in the last 72 hours.  Invalid input(s): FREET3 ------------------------------------------------------------------------------------------------------------------ No results for input(s): VITAMINB12, FOLATE, FERRITIN, TIBC, IRON, RETICCTPCT in the last 72 hours.  Coagulation profile Recent Labs  Lab 11/26/19 0120  INR 2.0*    No results for input(s): DDIMER in the last 72 hours.  Cardiac Enzymes No results for input(s): CKMB, TROPONINI, MYOGLOBIN in the last 168 hours.  Invalid input(s): CK ------------------------------------------------------------------------------------------------------------------    Component Value Date/Time   BNP 911.8 (H) 11/30/2019 0414    Micro Results Recent Results (from the past 240 hour(s))  SARS CORONAVIRUS 2 (TAT 6-24 HRS) Nasopharyngeal Nasopharyngeal Swab     Status: None   Collection  Time: 11/26/19  7:30 AM   Specimen: Nasopharyngeal Swab  Result Value Ref Range Status   SARS Coronavirus 2 NEGATIVE NEGATIVE Final    Comment: (NOTE) SARS-CoV-2 target nucleic acids are NOT DETECTED. The SARS-CoV-2 RNA is generally detectable in upper and lower respiratory specimens during the acute phase of infection. Negative results do not preclude SARS-CoV-2 infection, do not rule out co-infections with other pathogens, and should not be used as the sole basis for treatment or other patient management decisions. Negative results must be combined with clinical observations, patient history, and epidemiological information. The expected result is Negative. Fact Sheet for Patients: SugarRoll.be Fact Sheet for Healthcare Providers: https://www.woods-mathews.com/ This test is not yet approved or cleared by the [place] FDA and  has been authorized for detection and/or diagnosis of SARS-CoV-2 by FDA under an Emergency Use Authorization (EUA). This EUA will remain  in effect (meaning this test can be used) for the duration of the COVID-19 declaration under Section 56 4(b)(1) of the Act, 21 U.S.C. section 360bbb-3(b)(1), unless the authorization is terminated or revoked sooner. Performed at [facility], [place] [address], [place], [place] [address]     Radiology Reports DG Chest 2 View  Result Date: 11/26/2019 CLINICAL DATA:  Chest pain and shortness of breath EXAM: CHEST - 2 VIEW COMPARISON:  08/25/2019 FINDINGS: Small pleural effusions. Moderate cardiomegaly. Aortic valve replacement. Unchanged position of left chest wall AICD single lead. No focal airspace consolidation or pulmonary edema. IMPRESSION: Cardiomegaly and small bilateral pleural effusions. Electronically Signed   By: Ulyses Jarred M.D.   On: 11/26/2019 01:58   ECHOCARDIOGRAM COMPLETE  Result Date: 11/26/2019    ECHOCARDIOGRAM REPORT   Patient Name:   CAIUS SILBERNAGEL  Date of Exam: 11/26/2019 Medical Rec #:  [id number]       Height:       70.0 in Accession #:    [id number]      Weight:       275.6 lb Date of Birth:  September 24, 1950       BSA:          2.392 m Patient Age:    30 years        BP:  107/75 mmHg Patient Gender: M               HR:           113 bpm. Exam Location:  Inpatient Procedure: 2D Echo and Intracardiac Opacification Agent Indications:    CHF-Acute Systolic 488.89 / V69.45  History:        Patient has prior history of Echocardiogram examinations, most                 recent 03/04/2019. CAD and Previous Myocardial Infarction, Prior                 CABG and Defibrillator, Arrythmias:Atrial Fibrillation; Risk                 Factors:Dyslipidemia, Diabetes and Sleep Apnea. Ischemic                 cardiomyopathy.                 Aortic Valve: 29 mm Edwards Sapien prosthetic, stented (TAVR)                 valve is present in the aortic position. Procedure Date:                 01/04/2018.  Sonographer:    Darlina Sicilian RDCS Referring Phys: [id number] Berger  1. Left ventricular ejection fraction, by estimation, is <20%. The left ventricle has severely decreased function. The left ventricle demonstrates global hypokinesis. The left ventricular internal cavity size was moderately to severely dilated. Left ventricular diastolic function could not be evaluated.  2. Right ventricular systolic function is moderately reduced. The right ventricular size is moderately enlarged. There is moderately elevated pulmonary artery systolic pressure.  3. Left atrial size was moderately dilated.  4. The mitral valve is normal in structure. Mild mitral valve regurgitation. No evidence of mitral stenosis.  5. The aortic valve has been repaired/replaced. Aortic valve regurgitation is not visualized. There is a 29 mm Edwards Sapien prosthetic (TAVR) valve present in the aortic position. Procedure Date: 01/04/2018.  6. Mild pulmonic stenosis.  7. The inferior vena cava  is dilated in size with <50% respiratory variability, suggesting right atrial pressure of 15 mmHg. Comparison(s): No significant change from prior study. FINDINGS  Left Ventricle: Left ventricular ejection fraction, by estimation, is <20%. The left ventricle has severely decreased function. The left ventricle demonstrates global hypokinesis. Definity contrast agent was given IV to delineate the left ventricular endocardial borders. The left ventricular internal cavity size was moderately to severely dilated. There is no left ventricular hypertrophy. Left ventricular diastolic function could not be evaluated. Right Ventricle: The right ventricular size is moderately enlarged. No increase in right ventricular wall thickness. Right ventricular systolic function is moderately reduced. There is moderately elevated pulmonary artery systolic pressure. The tricuspid  regurgitant velocity is 3.02 m/s, and with an assumed right atrial pressure of 15 mmHg, the estimated right ventricular systolic pressure is 00.3 mmHg. Left Atrium: Left atrial size was moderately dilated. Right Atrium: Right atrial size was not well visualized. Pericardium: There is no evidence of pericardial effusion. Mitral Valve: The mitral valve is normal in structure. Mild mitral annular calcification. Mild mitral valve regurgitation. No evidence of mitral valve stenosis. Tricuspid Valve: The tricuspid valve is normal in structure. Tricuspid valve regurgitation is mild . No evidence of tricuspid stenosis. Aortic Valve: The aortic valve has been repaired/replaced. Aortic valve regurgitation is not visualized. Aortic valve mean  gradient measures 4.0 mmHg. Aortic valve peak gradient measures 7.7 mmHg. Aortic valve area, by VTI measures 3.69 cm. There is a 29 mm Edwards Sapien prosthetic, stented (TAVR) valve present in the aortic position. Procedure Date: 01/04/2018. Pulmonic Valve: The pulmonic valve was grossly normal. Pulmonic valve regurgitation is mild  to moderate. Mild pulmonic stenosis. Aorta: The aortic root and ascending aorta are structurally normal, with no evidence of dilitation. Venous: The inferior vena cava is dilated in size with less than 50% respiratory variability, suggesting right atrial pressure of 15 mmHg. IAS/Shunts: No atrial level shunt detected by color flow Doppler. Additional Comments: A pacer wire is visualized in the right ventricle and right atrium.  LEFT VENTRICLE PLAX 2D LVIDd:         6.29 cm LVIDs:         5.56 cm LV PW:         1.02 cm LV IVS:        1.05 cm LVOT diam:     2.70 cm LV SV:         85 LV SV Index:   36 LVOT Area:     5.73 cm  LV Volumes (MOD) LV vol d, MOD A2C: 341.0 ml LV vol d, MOD A4C: 256.0 ml LV vol s, MOD A2C: 270.0 ml LV vol s, MOD A4C: 206.0 ml LV SV MOD A2C:     71.0 ml LV SV MOD A4C:     256.0 ml LV SV MOD BP:      62.7 ml RIGHT VENTRICLE TAPSE (M-mode): 1.5 cm LEFT ATRIUM              Index LA diam:        5.50 cm  2.30 cm/m LA Vol (A2C):   110.0 ml 45.99 ml/m LA Vol (A4C):   135.0 ml 56.44 ml/m LA Biplane Vol: 127.0 ml 53.09 ml/m  AORTIC VALVE AV Area (Vmax):    4.34 cm AV Area (Vmean):   4.36 cm AV Area (VTI):     3.69 cm AV Vmax:           138.50 cm/s AV Vmean:          94.050 cm/s AV VTI:            0.231 m AV Peak Grad:      7.7 mmHg AV Mean Grad:      4.0 mmHg LVOT Vmax:         105.00 cm/s LVOT Vmean:        71.600 cm/s LVOT VTI:          0.149 m LVOT/AV VTI ratio: 0.65  AORTA Ao Root diam: 3.20 cm MITRAL VALVE               TRICUSPID VALVE MV Area (PHT): 6.43 cm    TR Peak grad:   36.5 mmHg MV Decel Time: 118 msec    TR Vmax:        302.00 cm/s MV E velocity: 99.15 cm/s                            SHUNTS                            Systemic VTI:  0.15 m  Systemic Diam: 2.70 cm Buford Dresser MD Electronically signed by Buford Dresser MD Signature Date/Time: 11/26/2019/4:53:33 PM    Final     Time Spent in minutes  30   Lala Lund M.D on 11/30/2019  at 9:59 AM  To page go to [email or web address] - password [facility]

## 2019-11-30 NOTE — CV Procedure (Signed)
   Pulmonary Artery Catheter Insertion Procedure Note [name] [name] [id number] 04-04-1951    Procedure: Insertion of Pulmonary Artery Catheter Indications: Shock - Hemodynamic Monitoring   Procedure Details Consent: Risks of procedure as well as the alternatives and risks of each were explained to the (patient/caregiver).  Consent for procedure obtained. Time Out: Verified patient identification, verified procedure, site/side was marked, verified correct patient position, special equipment/implants available, medications/allergies/relevent history reviewed, required imaging and test results available.  Performed   The right neck was prepped and draped in the routine sterile fashion and anesthetized with 1% local lidocaine. An 8 FR venous sheath was placed in the right internal jugular vein using a modified Seldinger technique and u/s guidance. A standard Swan-Ganz catheter was used for the procedure. The distal tip of the PA cath was maneuvered into the right pulmonary artery using pressure waveform guidance and the sheath was sutured in place.     Evaluation Blood flow good Complications: No apparent complications Patient did tolerate procedure well. Chest X-ray ordered to verify placement.  CXR: pending   [name], MD  3:19 PM

## 2019-11-30 NOTE — Progress Notes (Signed)
Advanced Heart Failure Rounding Note   Subjective:    Received high-dose IV lasix last night. He says he peed a lot but weight up 6 pounds.   Tells me he has had a very rough morning. Weak and lethargic. CBG was low.   Sitting up on side of bed. Hard to keep his eyes open    Objective:   Weight Range:  Vital Signs:   Temp:  [97.4 F (36.3 C)-97.9 F (36.6 C)] 97.9 F (36.6 C) (04/04 0810) Pulse Rate:  [71-115] 115 (04/04 0810) Resp:  [20] 20 (04/04 0810) BP: (104-116)/(66-95) 116/66 (04/04 0810) SpO2:  [94 %-100 %] 95 % (04/04 0810) Weight:  [123.6 kg] 123.6 kg (04/04 0449) Last BM Date: 11/30/19  Weight change: Filed Weights   11/28/19 0536 11/29/19 0310 11/30/19 0449  Weight: 120 kg 120.9 kg 123.6 kg    Intake/Output:   Intake/Output Summary (Last 24 hours) at 11/30/2019 0938 Last data filed at 11/30/2019 0900 Gross per 24 hour  Intake 1029.67 ml  Output 825 ml  Net 204.67 ml     Physical Exam: General:  Sitting on side of bed . Weakappearing. No resp difficulty HEENT: normal Neck: supple. JVP to ear . Carotids 2+ bilat; no bruits. No lymphadenopathy or thryomegaly appreciated. Cor: PMI nondisplaced. Tachy regular +s3. Lungs: clear Abdomen: soft, nontender, nondistended. No hepatosplenomegaly. No bruits or masses. Good bowel sounds. Extremities: no cyanosis, clubbing, rash, 3+  Edema into thighs  Cool Neuro: Lethargic but communicative. Moves all 4   Telemetry: Sinus tach 100-115 Personally reviewed   Labs: Basic Metabolic Panel: Recent Labs  Lab 11/26/19 0120 11/26/19 0120 11/27/19 0058 11/27/19 0058 11/28/19 0257 11/29/19 0718 11/30/19 0414  NA 133*  --  135  --  133* 134* 133*  K 4.7  --  3.2*  --  3.2* 4.4 3.2*  CL 81*  --  84*  --  83* 83* 85*  CO2 42*  --  35*  --  32 35* 34*  GLUCOSE 149*  --  174*  --  99 65* 68*  BUN 53*  --  53*  --  53* 55* 55*  CREATININE 3.15*  --  2.53*  --  2.52* 2.83* 2.70*  CALCIUM 8.7*   < > 8.6*   < >  8.9 9.0 8.7*  MG  --   --  2.6*  --  2.7* 3.1* 3.1*   < > = values in this interval not displayed.    Liver Function Tests: Recent Labs  Lab 11/27/19 0058 11/28/19 0257 11/29/19 0718 11/30/19 0414  AST 20 23 37 34  ALT 12 14 20 19   ALKPHOS 110 117 120 127*  BILITOT 1.7* 1.3* 1.5* 1.5*  PROT 6.9 7.4 7.1 6.8  ALBUMIN 2.9* 3.0* 2.9* 2.7*   No results for input(s): LIPASE, AMYLASE in the last 168 hours. No results for input(s): AMMONIA in the last 168 hours.  CBC: Recent Labs  Lab 11/26/19 0120 11/27/19 0058 11/28/19 0257 11/29/19 0718  WBC 7.2 6.4 8.0 8.2  NEUTROABS  --   --   --  5.5  HGB 13.4 12.7* 13.9 13.2  HCT 44.2 40.0 44.5 43.1  MCV 99.8 95.9 96.1 97.1  PLT 312 291 330 354    Cardiac Enzymes: No results for input(s): CKTOTAL, CKMB, CKMBINDEX, TROPONINI in the last 168 hours.  BNP: BNP (last 3 results) Recent Labs    11/28/19 0257 11/29/19 0718 11/30/19 0414  BNP 1,167.5* 872.9* 911.8*  ProBNP (last 3 results) No results for input(s): PROBNP in the last 8760 hours.    Other results:  Imaging:  No results found.   Medications:     Scheduled Medications: . amiodarone  200 mg Oral Daily  . aspirin EC  81 mg Oral Daily  . enoxaparin (LOVENOX) injection  1 mg/kg Subcutaneous Q24H  . insulin aspart  0-9 Units Subcutaneous TID WC  . insulin detemir  20 Units Subcutaneous QHS  . isosorbide dinitrate  10 mg Oral Daily  . pantoprazole  40 mg Oral Daily  . potassium chloride  40 mEq Oral BID  . rosuvastatin  40 mg Oral Daily  . tamsulosin  0.4 mg Oral Daily     Infusions: . furosemide Stopped (11/30/19 0931)     PRN Medications:  acetaminophen, alum & mag hydroxide-simeth, nitroGLYCERIN, polyethylene glycol, senna-docusate, sodium chloride, zolpidem   Assessment/Plan:   1. Acute on chronic systolic HF -> cardiogenic shock  - due to longstanding  iCM  - echo 7/20 EF 15-20% moderate RV dysfunction - echo 11/16/19 EF 10-15% severe RV  dysfunction - Reports baseline NYHA III symptoms - Continues to worsen. Exam c/w marked volume overload and low output. Will move to ICU for swan and probable inotrope support - Likely not VAD candidate with severe RV failure - hold b-blocker, ARB/ARNI with low output and AKI - has LBBB probably too advanced to benefit from CRT  2. CAD s/p CABG 2001 - admitted with CP. hstrop negative  - no further CP - cath 2/19 with patent grafts - not candidate for repeat cath with AKI  3. AS s/p TAVR - stable  4. Acute on chronic CKD 3b - recent baseline 1.3-1.5 - now 2.83 -> 2.7 with high-dose lasix - likely cardiorenal/shock  5. DM2 - per primary  6. PAF   - in NSR on amio  - On xarelto at home. On lovenox here will switch to IV heparin   7. Hypokalemia - supp  CRITICAL CARE Performed by: [name]  Total critical care time: 35 minutes  Critical care time was exclusive of separately billable procedures and treating other patients.  Critical care was necessary to treat or prevent imminent or life-threatening deterioration.  Critical care was time spent personally by me (independent of midlevel providers or residents) on the following activities: development of treatment plan with patient and/or surrogate as well as nursing, discussions with consultants, evaluation of patient's response to treatment, examination of patient, obtaining history from patient or surrogate, ordering and performing treatments and interventions, ordering and review of laboratory studies, ordering and review of radiographic studies, pulse oximetry and re-evaluation of patient's condition.    Length of Stay: 4   [name] MD 11/30/2019, 9:38 AM  Advanced Heart Failure Team Pager [phone] (M-F; 7a - 4p)  Please contact [facility] for night-coverage after hours (4p -7a ) and weekends on [email or web address]

## 2019-11-30 NOTE — Progress Notes (Signed)
[facility] for lovenox Indication: atrial fibrillation  Allergies  Allergen Reactions  . Dyflex-G [Dyphylline-Guaifenesin] Hives  . Eliquis [Apixaban] Other (See Comments)    Side effects not a true allergy- he stated that it made him feel bad    Patient Measurements: Height: 5\' 10"  (177.8 cm) Weight: 123.6 kg (272 lb 8.2 oz) IBW/kg (Calculated) : 73  Vital Signs: Temp: 97.8 F (36.6 C) (04/04 0449) Temp Source: Oral (04/04 0449) BP: 114/95 (04/04 0258) Pulse Rate: 97 (04/04 0258)  Labs: Recent Labs    11/27/19 2348 11/28/19 0257 11/29/19 0718 11/30/19 0414  HGB  --  13.9 13.2  --   HCT  --  44.5 43.1  --   PLT  --  330 354  --   CREATININE  --  2.52* 2.83* 2.70*  TROPONINIHS 16  --   --   --     Estimated Creatinine Clearance: 34 mL/min (A) (by C-G formula based on SCr of 2.7 mg/dL (H)).   Medical History: Past Medical History:  Diagnosis Date  . AICD (automatic cardioverter/defibrillator) present 05/18/2016  . Anginal pain ([place])    occ none recent  . Aortic stenosis   . Arthritis   . CAD (coronary artery disease)   . Cardiomyopathy, ischemic   . CHF (congestive heart failure) ([place])   . Chronic back pain   . Diabetes mellitus without complication ([place])   . Elevated PSA   . History of kidney stones   . Hyperlipidemia   . Morbid obesity ([place])   . Myocardial infarction ([place])   . OSA (obstructive sleep apnea)    does not wear CPAP, -order run out told need to redo  test  . Paroxysmal atrial fibrillation ([place]) 09/29/2018   This was found on event monitor October 2019  . 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   29 mm Edwards Sapien 3 transcatheter heart valve placed via percutaneous right transfemoral approach    Assessment: 56 yom presented to the ED with CP. Found to have an AKI. He is on  chronic xarelto for afib with his last dose being on 3/30. He was started on heparin briefly and then was transitioned to lovenox temporarily for AKI.  CBC has been stable and wnl. No bleeding noted.   Renal function still borderline so will continue with qd dosing.   Goal of Therapy:  Anti-Xa level 0.6-1 units/ml 4hrs after LMWH dose given Monitor platelets by anticoagulation protocol: Yes   Plan:  Lovenox 120mg  SQ Q24H CBC Q72H  Vertis Kelch, PharmD, [facility] PGY2 Cardiology Pharmacy Resident Phone [phone] 11/30/2019       7:51 AM  Please check [email or web address] for unit-specific pharmacist phone numbers

## 2019-11-30 NOTE — Progress Notes (Signed)
   Patient still very tachypneic and lethargic.  MV sat 61%   RHC numbers shot  RA 19 PA 52/34 (40) PCWP 20 Thermo CO/CI 3.8/1.6  SVR 893 PAPi 0.94 RA/PCWP  0.95 CPO 0.51  Numbers c/w cardiogenic shock with R>>L dysfunction. Options quite limited. Will start milrinone. Diurese gently.   Additional CCT 30 minutes.   [name], MD  4:03 PM

## 2019-11-30 NOTE — Progress Notes (Signed)
Patient transferred via bed with Cephus Richer RN to [facility] room 21. Wife at bedside.

## 2019-11-30 NOTE — Progress Notes (Addendum)
[name] for lovenox >> heparin Indication: atrial fibrillation  Allergies  Allergen Reactions  . Dyflex-G [Dyphylline-Guaifenesin] Hives  . Eliquis [Apixaban] Other (See Comments)    Side effects not a true allergy- he stated that it made him feel bad    Patient Measurements: Height: 5\' 10"  (177.8 cm) Weight: 123.6 kg (272 lb 8.2 oz) IBW/kg (Calculated) : 73  Vital Signs: Temp: 98.2 F (36.8 C) (04/04 1210) Temp Source: Oral (04/04 1210) BP: 113/73 (04/04 1210) Pulse Rate: 95 (04/04 1210)  Labs: Recent Labs    11/27/19 2348 11/28/19 0257 11/29/19 0718 11/30/19 0414  HGB  --  13.9 13.2  --   HCT  --  44.5 43.1  --   PLT  --  330 354  --   CREATININE  --  2.52* 2.83* 2.70*  TROPONINIHS 16  --   --   --     Estimated Creatinine Clearance: 34 mL/min (A) (by C-G formula based on SCr of 2.7 mg/dL (H)).   Medical History: Past Medical History:  Diagnosis Date  . AICD (automatic cardioverter/defibrillator) present 05/18/2016  . Anginal pain ([place])    occ none recent  . Aortic stenosis   . Arthritis   . CAD (coronary artery disease)   . Cardiomyopathy, ischemic   . CHF (congestive heart failure) ([place])   . Chronic back pain   . Diabetes mellitus without complication ([place])   . Elevated PSA   . History of kidney stones   . Hyperlipidemia   . Morbid obesity ([place])   . Myocardial infarction ([place])   . OSA (obstructive sleep apnea)    does not wear CPAP, -order run out told need to redo  test  . Paroxysmal atrial fibrillation ([place]) 09/29/2018   This was found on event monitor October 2019  . 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   29 mm Edwards Sapien 3 transcatheter heart valve placed via percutaneous right transfemoral approach    Assessment: 64 yom presented to the ED with CP. Found to have an AKI.  He is on chronic xarelto for afib with his last dose being on 3/30. He was started on heparin briefly (most recent dose adjustment to 1000 units/hr) and then was transitioned to lovenox temporarily for AKI. Pharmacy has been consulted to transition back to IV heparin. Last dose of Lovenox was given 4/3 at 1746  CBC has been stable and wnl. No bleeding noted.   Goal of Therapy:  Heparin level 0.3-0.7 units/ml aPTT 66-102 seconds Monitor platelets by anticoagulation protocol: Yes   Plan:  Start IV heparin at 1000 units/hr at 1600 today (1-2 hours before next dose of Lovenox would have been given) Check aPTT and heparin level in 6 hours Daily heparin level and aPTT Monitor for bleeding  Vertis Kelch, PharmD, [facility] PGY2 Cardiology Pharmacy Resident Phone [phone] 11/30/2019       3:07 PM  Please check [email or web address] for unit-specific pharmacist phone numbers

## 2019-12-01 ENCOUNTER — Ambulatory Visit: Payer: Medicare Other | Admitting: Cardiology

## 2019-12-01 LAB — COMPREHENSIVE METABOLIC PANEL
ALT: 18 U/L (ref 0–44)
AST: 28 U/L (ref 15–41)
Albumin: 2.7 g/dL — ABNORMAL LOW (ref 3.5–5.0)
Alkaline Phosphatase: 133 U/L — ABNORMAL HIGH (ref 38–126)
Anion gap: 13 (ref 5–15)
BUN: 47 mg/dL — ABNORMAL HIGH (ref 8–23)
CO2: 34 mmol/L — ABNORMAL HIGH (ref 22–32)
Calcium: 8.4 mg/dL — ABNORMAL LOW (ref 8.9–10.3)
Chloride: 89 mmol/L — ABNORMAL LOW (ref 98–111)
Creatinine, Ser: 2.68 mg/dL — ABNORMAL HIGH (ref 0.61–1.24)
GFR calc Af Amer: 27 mL/min — ABNORMAL LOW (ref >60)
GFR calc non Af Amer: 23 mL/min — ABNORMAL LOW (ref >60)
Glucose, Bld: 132 mg/dL — ABNORMAL HIGH (ref 70–99)
Potassium: 2.9 mmol/L — ABNORMAL LOW (ref 3.5–5.1)
Sodium: 136 mmol/L (ref 135–145)
Total Bilirubin: 1.6 mg/dL — ABNORMAL HIGH (ref 0.3–1.2)
Total Protein: 6.6 g/dL (ref 6.5–8.1)

## 2019-12-01 LAB — CBC
HCT: 39.8 % (ref 39.0–52.0)
Hemoglobin: 12.3 g/dL — ABNORMAL LOW (ref 13.0–17.0)
MCH: 29.5 pg (ref 26.0–34.0)
MCHC: 30.9 g/dL (ref 30.0–36.0)
MCV: 95.4 fL (ref 80.0–100.0)
Platelets: 285 10*3/uL (ref 150–400)
RBC: 4.17 MIL/uL — ABNORMAL LOW (ref 4.22–5.81)
RDW: 17.2 % — ABNORMAL HIGH (ref 11.5–15.5)
WBC: 8.6 10*3/uL (ref 4.0–10.5)
nRBC: 0 % (ref 0.0–0.2)

## 2019-12-01 LAB — COOXEMETRY PANEL
Carboxyhemoglobin: 1.6 % — ABNORMAL HIGH (ref 0.5–1.5)
Methemoglobin: 0.6 % (ref 0.0–1.5)
O2 Saturation: 63.4 %
Total hemoglobin: 12.1 g/dL (ref 12.0–16.0)

## 2019-12-01 LAB — HEPARIN LEVEL (UNFRACTIONATED)
Heparin Unfractionated: 0.55 IU/mL (ref 0.30–0.70)
Heparin Unfractionated: 0.57 IU/mL (ref 0.30–0.70)

## 2019-12-01 LAB — GLUCOSE, CAPILLARY
Glucose-Capillary: 130 mg/dL — ABNORMAL HIGH (ref 70–99)
Glucose-Capillary: 213 mg/dL — ABNORMAL HIGH (ref 70–99)
Glucose-Capillary: 212 mg/dL — ABNORMAL HIGH (ref 70–99)
Glucose-Capillary: 189 mg/dL — ABNORMAL HIGH (ref 70–99)

## 2019-12-01 LAB — MAGNESIUM: Magnesium: 2.9 mg/dL — ABNORMAL HIGH (ref 1.7–2.4)

## 2019-12-01 LAB — APTT: aPTT: 79 seconds — ABNORMAL HIGH (ref 24–36)

## 2019-12-01 LAB — POTASSIUM: Potassium: 3 mmol/L — ABNORMAL LOW (ref 3.5–5.1)

## 2019-12-01 MED ORDER — AMIODARONE HCL IN DEXTROSE 360-4.14 MG/200ML-% IV SOLN
60.0000 mg/h | INTRAVENOUS | Status: DC
  Administered 2019-12-01: 60 mg/h via INTRAVENOUS
  Filled 2019-12-01: qty 200
  Filled 2019-12-01: qty 400

## 2019-12-01 MED ORDER — AMIODARONE HCL IN DEXTROSE 360-4.14 MG/200ML-% IV SOLN
30.0000 mg/h | INTRAVENOUS | Status: DC
  Administered 2019-12-02: 30 mg/h via INTRAVENOUS
  Filled 2019-12-01: qty 200

## 2019-12-01 MED ORDER — POTASSIUM CHLORIDE 10 MEQ/50ML IV SOLN
10.0000 meq | INTRAVENOUS | Status: AC
  Administered 2019-12-01 (×6): 10 meq via INTRAVENOUS
  Filled 2019-12-01 (×4): qty 50

## 2019-12-01 MED ORDER — AMIODARONE LOAD VIA INFUSION
150.0000 mg | Freq: Once | INTRAVENOUS | Status: AC
  Administered 2019-12-01: 16:00:00 150 mg via INTRAVENOUS
  Filled 2019-12-01: qty 83.34

## 2019-12-01 MED ORDER — POTASSIUM CHLORIDE CRYS ER 20 MEQ PO TBCR
40.0000 meq | EXTENDED_RELEASE_TABLET | Freq: Every day | ORAL | Status: DC
  Administered 2019-12-01: 40 meq via ORAL
  Filled 2019-12-01: qty 2

## 2019-12-01 MED ORDER — POTASSIUM CHLORIDE 10 MEQ/100ML IV SOLN: 10.0000 meq | INTRAVENOUS | Status: DC

## 2019-12-01 NOTE — Progress Notes (Signed)
[name] for lovenox >> heparin Indication: atrial fibrillation  Allergies  Allergen Reactions  . Dyflex-G [Dyphylline-Guaifenesin] Hives  . Ambien [Zolpidem Tartrate] Other (See Comments)    Increased lethargy during daytime  . Eliquis [Apixaban] Other (See Comments)    Side effects not a true allergy- he stated that it made him feel bad    Patient Measurements: Height: 5\' 10"  (177.8 cm) Weight: 117.8 kg (259 lb 11.2 oz) IBW/kg (Calculated) : 73  Vital Signs: Temp: 97.7 F (36.5 C) (04/05 1000) Temp Source: Core (04/05 0400) BP: 93/58 (04/05 1000) Pulse Rate: 125 (04/05 1000)  Labs: Recent Labs    11/29/19 0718 11/30/19 0414 11/30/19 2333 12/01/19 0453  HGB 13.2  --   --  12.3*  HCT 43.1  --   --  39.8  PLT 354  --   --  285  APTT  --   --  79*  --   HEPARINUNFRC  --   --  0.55 0.57  CREATININE 2.83* 2.70*  --  2.68*    Estimated Creatinine Clearance: 33.4 mL/min (A) (by C-G formula based on SCr of 2.68 mg/dL (H)).   Medical History: Past Medical History:  Diagnosis Date  . AICD (automatic cardioverter/defibrillator) present 05/18/2016  . Anginal pain ([place])    occ none recent  . Aortic stenosis   . Arthritis   . CAD (coronary artery disease)   . Cardiomyopathy, ischemic   . CHF (congestive heart failure) ([place])   . Chronic back pain   . Diabetes mellitus without complication ([place])   . Elevated PSA   . History of kidney stones   . Hyperlipidemia   . Morbid obesity ([place])   . Myocardial infarction ([place])   . OSA (obstructive sleep apnea)    does not wear CPAP, -order run out told need to redo  test  . Paroxysmal atrial fibrillation ([place]) 09/29/2018   This was found on event monitor October 2019  . 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   29 mm Edwards Sapien 3 transcatheter heart valve  placed via percutaneous right transfemoral approach    Assessment: 58 yom presented to the ED with CP. Found to have an AKI. He is on chronic xarelto for afib with his last dose being on 3/30. He was started on heparin briefly (most recent dose adjustment to 1000 units/hr) and then was transitioned to lovenox while inpatient for AKI. Pharmacy has been consulted to transition back to IV heparin for possible procedures in ICU. Last dose of Lovenox was given 4/3 at 1746  CBC has been stable and wnl. No bleeding noted.  Heparin drip 1000 uts/hr HL 0.57 at goal.     Goal of Therapy:  Heparin level 0.3-0.7 units/mL Monitor platelets by anticoagulation protocol: Yes   Plan:  Cont heparin at 1000 units/hr Daily Hl, CBC  Monitor for bleeding   Bonnita Nasuti Pharm.D. CPP, BCPS Clinical Pharmacist [phone] 12/01/2019 12:19 PM

## 2019-12-01 NOTE — Progress Notes (Signed)
Advanced Heart Failure Rounding Note   Subjective:    Remains on milrinone 0.125 and lasix gtt. Co-ox 63%   Creatinine remains elevated at 2.7. Negative 3.5L  Weight down 12 pounds.  Still orthopneic and uncomfortable. Fatigued   CVP 17 PA 66/34 PCW 25 Thermo 5.1/2.1 SVR 950  [place] 1.88   Objective:   Weight Range:  Vital Signs:   Temp:  [97.3 F (36.3 C)-98.2 F (36.8 C)] 97.7 F (36.5 C) (04/05 1000) Pulse Rate:  [25-129] 125 (04/05 1000) Resp:  [16-23] 22 (04/05 1000) BP: (74-122)/(46-80) 93/58 (04/05 1000) SpO2:  [89 %-100 %] 91 % (04/05 1000) Weight:  [117.8 kg] 117.8 kg (04/05 0400) Last BM Date: 11/30/19  Weight change: Filed Weights   11/29/19 0310 11/30/19 0449 12/01/19 0400  Weight: 120.9 kg 123.6 kg 117.8 kg    Intake/Output:   Intake/Output Summary (Last 24 hours) at 12/01/2019 1046 Last data filed at 12/01/2019 1000 Gross per 24 hour  Intake 715.83 ml  Output 3825 ml  Net -3109.17 ml     Physical Exam: General:  Weak appearing. Orthopneic HEENT: normal Neck: supple. JVP to ear Carotids 2+ bilat; no bruits. No lymphadenopathy or thryomegaly appreciated. Cor: PMI nondisplaced. Irregular rate & rhythm + s3 Lungs: + crackles  Abdomen: obese soft, nontender, nondistended. No hepatosplenomegaly. No bruits or masses. Good bowel sounds. Extremities: no cyanosis, clubbing, rash, 2+ edema Neuro: alert & orientedx3, cranial nerves grossly intact. moves all 4 extremities w/o difficulty. Affect pleasant   Telemetry: AF 120s  Personally reviewed   Labs: Basic Metabolic Panel: Recent Labs  Lab 11/27/19 0058 11/27/19 0058 11/28/19 0257 11/28/19 0257 11/29/19 0718 11/30/19 0414 12/01/19 0453  NA 135  --  133*  --  134* 133* 136  K 3.2*  --  3.2*  --  4.4 3.2* 2.9*  CL 84*  --  83*  --  83* 85* 89*  CO2 35*  --  32  --  35* 34* 34*  GLUCOSE 174*  --  99  --  65* 68* 132*  BUN 53*  --  53*  --  55* 55* 47*  CREATININE 2.53*  --  2.52*  --   2.83* 2.70* 2.68*  CALCIUM 8.6*   < > 8.9   < > 9.0 8.7* 8.4*  MG 2.6*  --  2.7*  --  3.1* 3.1* 2.9*   < > = values in this interval not displayed.    Liver Function Tests: Recent Labs  Lab 11/27/19 0058 11/28/19 0257 11/29/19 0718 11/30/19 0414 12/01/19 0453  AST 20 23 37 34 28  ALT 12 14 20 19 18   ALKPHOS 110 117 120 127* 133*  BILITOT 1.7* 1.3* 1.5* 1.5* 1.6*  PROT 6.9 7.4 7.1 6.8 6.6  ALBUMIN 2.9* 3.0* 2.9* 2.7* 2.7*   No results for input(s): LIPASE, AMYLASE in the last 168 hours. No results for input(s): AMMONIA in the last 168 hours.  CBC: Recent Labs  Lab 11/26/19 0120 11/27/19 0058 11/28/19 0257 11/29/19 0718 12/01/19 0453  WBC 7.2 6.4 8.0 8.2 8.6  NEUTROABS  --   --   --  5.5  --   HGB 13.4 12.7* 13.9 13.2 12.3*  HCT 44.2 40.0 44.5 43.1 39.8  MCV 99.8 95.9 96.1 97.1 95.4  PLT 312 291 330 354 285    Cardiac Enzymes: No results for input(s): CKTOTAL, CKMB, CKMBINDEX, TROPONINI in the last 168 hours.  BNP: BNP (last 3 results) Recent Labs  11/28/19 0257 11/29/19 0718 11/30/19 0414  BNP 1,167.5* 872.9* 911.8*    ProBNP (last 3 results) No results for input(s): PROBNP in the last 8760 hours.    Other results:  Imaging: DG Chest 1 View  Result Date: 11/30/2019 CLINICAL DATA:  Pulmonary artery catheter insertion. EXAM: CHEST  1 VIEW COMPARISON:  Radiograph 11/26/2019 FINDINGS: Right internal jugular Swan-Ganz catheter tip in the region of the right pulmonary outflow tract. Post median sternotomy and TAVR. Left-sided pacemaker remains in place. Cardiomegaly with equivocal increase from prior exam. Worsening hazy opacity at the lung bases consistent with increasing pleural effusions. Developing interstitial opacities/pulmonary edema. No evidence of pneumothorax. IMPRESSION: 1. Right internal jugular Swan-Ganz catheter tip in the region of the right pulmonary outflow tract. 2. Cardiomegaly with worsening pleural effusions and developing pulmonary edema.  Electronically Signed   By: Keith Rake M.D.   On: 11/30/2019 16:09     Medications:     Scheduled Medications: . amiodarone  200 mg Oral Daily  . aspirin EC  81 mg Oral Daily  . Chlorhexidine Gluconate Cloth  6 each Topical Daily  . Chlorhexidine Gluconate Cloth  6 each Topical Daily  . insulin aspart  0-9 Units Subcutaneous TID WC  . insulin detemir  20 Units Subcutaneous QHS  . isosorbide dinitrate  10 mg Oral Daily  . pantoprazole  40 mg Oral Daily  . potassium chloride  40 mEq Oral Daily  . rosuvastatin  40 mg Oral Daily  . sodium chloride flush  10-40 mL Intracatheter Q12H  . tamsulosin  0.4 mg Oral Daily    Infusions: . furosemide 62 mL/hr at 12/01/19 1000  . heparin 1,000 Units/hr (12/01/19 1000)  . milrinone 0.125 mcg/kg/min (12/01/19 1000)  . potassium chloride 10 mEq (12/01/19 1015)    PRN Medications: acetaminophen, alum & mag hydroxide-simeth, nitroGLYCERIN, polyethylene glycol, senna-docusate, sodium chloride, sodium chloride flush   Assessment/Plan:   1. Acute on chronic systolic HF -> cardiogenic shock  - due to longstanding  iCM  - echo 7/20 EF 15-20% moderate RV dysfunction - echo 11/16/19 EF 10-15% severe RV dysfunction - Reports baseline NYHA III symptoms - Now on milrinone 0.125 and lasix gtt  - He remains very tenuous in setting of BIV failure - Swan numbers as above (doone personally) shows ongoing volume overload and marginal output - Likely not VAD candidate with severe RV failure - hold b-blocker, ARB/ARNI with low output and AKI - has LBBB probably too advanced to benefit from CRT - Long talk with him and his wife about my concerns over his prognosis and we also discussed code status. He is clear that if he is getting worse he would want to die at home   2. CAD s/p CABG 2001 - admitted with CP. hstrop negative  - no further CP - cath 2/19 with patent grafts - not candidate for repeat cath with AKI  3. AS s/p TAVR - stable  4.  Acute on chronic CKD 3b - recent baseline 1.3-1.5 - now 2.83 -> 2.7 -> 2.7 - likely cardiorenal/shock - hopefully will improve with inotrope support  5. DM2 - per primary  6. PAF  - back in AF with RVR. Switch po amio to IV amio  - On xarelto at home.  - continue IV heparin . - No bleeding - Discussed dosing with PharmD personally.   7. Hypokalemia - supp aggressively  CRITICAL CARE Performed by: [name]  Total critical care time: 35 minutes  Critical care  time was exclusive of separately billable procedures and treating other patients.  Critical care was necessary to treat or prevent imminent or life-threatening deterioration.  Critical care was time spent personally by me (independent of midlevel providers or residents) on the following activities: development of treatment plan with patient and/or surrogate as well as nursing, discussions with consultants, evaluation of patient's response to treatment, examination of patient, obtaining history from patient or surrogate, ordering and performing treatments and interventions, ordering and review of laboratory studies, ordering and review of radiographic studies, pulse oximetry and re-evaluation of patient's condition.    Length of Stay: 5   [name] MD 12/01/2019, 10:46 AM  Advanced Heart Failure Team Pager [phone] (M-F; [place])  Please contact [facility] for night-coverage after hours (4p -7a ) and weekends on [email or web address]

## 2019-12-01 NOTE — Consult Note (Signed)
WOC consulted on how to perform wound care now that MD has ordered Unna's boots. Reviewed the chart and updated MD's orders to include frequency of change and wound care orders needed.  Unna's boots to be change 2x wk Mondays and Thursdays.  The current compression wraps were placed on 11/27/19.     Re consult if needed, will not follow at this time. Thanks  Mckinleigh Schuchart [employer], RN,CWOCN, CNS, [place] [phone])

## 2019-12-01 NOTE — Progress Notes (Signed)
MD Candiss Norse rounding bedside and concerned with patient's level of lethargy.  Per [facility] patient received 5 mg of Ambien at 2300 on 11/30/2019.  MD Candiss Norse advised RN to discontinue this order and to place Ambien as a patient allergy.

## 2019-12-01 NOTE — Progress Notes (Signed)
PROGRESS NOTE                                                                                                                                                                                                             Patient Demographics:    [name] [name], is a 69 y.o. male, DOB - 15-Oct-1950, QHU:[id number]  Admit date - 11/26/2019   Admitting Physician Chauncey Mann, MD  Outpatient Primary MD for the patient is Kathyrn Drown, MD  LOS - 5  Chief Complaint  Patient presents with  . Chest Pain       Brief Narrative  68 year old Caucasian male who was admitted few hours ago with symptoms of CHF with known past medical history of ischemic cardiomyopathy with chronic systolic heart failure EF of 15%, s/p CABG, aortic stenosis s/p TAVR, paroxysmal A. fib [place] vas 2 score of greater than 3, hypertension, dyslipidemia, morbid obesity, type 2 diabetes, CKD 3 who presented to the hospital with chief complaints of chest pain and shortness of breath along with leg edema.   Subjective:   Patient in bed denies any headache, no chest pain, no shortness of breath.  Says he feels slightly sad but not suicidal homicidal, no weakness.   Assessment  & Plan :     1.  Acute on chronic systolic heart failure in a patient with EF less than 20% s/p TAVR for AS with reduced right-sided RV systolic function has AICD.  Patient has combination of both left and right-sided heart failure (this admission appears to be predominantly right-sided heart failure in nature due to more peripheral edema than lung involvement) , has massive leg edema and relatively spared lungs, he was diuresed with IV pushes however he became hypotensive and thereafter was transferred to ICU under cardiology care.  He received right IJ Swan-Ganz catheter on 11/30/2019 and was started on IV Lasix drip along with milrinone drip, blood pressure too low to tolerate beta-blocker or any other blood pressure medication at this time,  renal failure prevents the use of ACE/ARB or Entresto etc, overall condition remains extremely tenuous.   2.  Hypokalemia.  Replaced IV and oral and will monitor closely.  3.  Paroxysmal atrial fibrillation.  Mali vas 2 score of at least 4.  Was in RVR with hypotension morning of 11/27/2019, blood pressure too low to tolerate beta-blocker calcium channel blockers, and placed him on IV amiodarone drip for 2 days and now in good rate control, now has been switched  to oral amiodarone continue heparin drip for anticoagulation as tolerated.  4.  AKI on CKD 4 - improving with diuresis monitor.  Creatinine is anywhere between 1.5 and 2.5.  Currently certainly has AKI in the setting of hypotension and CHF.  Closely with diuresis.  5.  History of AS and TAVR procedure.  Supportive care.  6.  CAD with underlying ischemic cardiomyopathy s/p CABG.  No acute issues continue combination of aspirin, Imdur and statin for secondary prevention.  Once blood pressure improves low-dose beta-blocker if tolerated.  7. OSA - ? Patient refused outpt Study, undiagnosed.  Counseled patient advised to get another sleep study.  8.  Orthostatic hypotension.  Hold further diuretics, TED stockings/ UNNA  and monitor.      9. DM2 - on Lantus + ISS, Lantus dose dropped again on 11/30/2019 to avoid early morning hypoglycemia.  Lab Results  Component Value Date   HGBA1C 7.2 (H) 11/26/2019    CBG (last 3)  Recent Labs    11/30/19 1624 11/30/19 2150 12/01/19 0659  GLUCAP 98 195* 130*     He has refused working with PT and remains extremely argumentative with all staff members    Family Communication  : Wife Santiago Glad on 11/27/2019 - [phone] , 11/30/19 @ 10 am - message left  Code Status :  Full  Disposition Plan  : Stay in the hospital for treatment of severe CHF exacerbation along with A. fib RVR  Consults  : Cardiology  Procedures  :    Right IJ Swan-Ganz catheter placement 11/30/2019 by Dr. Haroldine Laws.     Echocardiogram -   1. Left ventricular ejection fraction, by estimation, is <20%. The left ventricle has severely decreased function. The left ventricle demonstrates  global hypokinesis. The left ventricular internal cavity size was moderately to severely dilated. Left ventricular diastolic function could not be evaluated.  2. Right ventricular systolic function is moderately reduced. The right ventricular size is moderately enlarged. There is moderately elevated  pulmonary artery systolic pressure.  3. Left atrial size was moderately dilated.  4. The mitral valve is normal in structure. Mild mitral valve regurgitation. No evidence of mitral stenosis.  5. The aortic valve has been repaired/replaced. Aortic valve regurgitation is not visualized. There is a 29 mm Edwards Sapien prosthetic (TAVR) valve present in the aortic position. Procedure Date: 01/04/2018.  6. Mild pulmonic stenosis.  7. The inferior vena cava is dilated in size with <50% respiratory variability, suggesting right atrial pressure of 15 mmHg.    DVT Prophylaxis  : Heparin drip  Lab Results  Component Value Date   PLT 285 12/01/2019    Diet :  Diet Order            Diet 2 gram sodium Room service appropriate? Yes; Fluid consistency: Thin; Fluid restriction: 1200 mL Fluid  Diet effective now               Inpatient Medications Scheduled Meds: . amiodarone  200 mg Oral Daily  . aspirin EC  81 mg Oral Daily  . Chlorhexidine Gluconate Cloth  6 each Topical Daily  . Chlorhexidine Gluconate Cloth  6 each Topical Daily  . insulin aspart  0-9 Units Subcutaneous TID WC  . insulin detemir  20 Units Subcutaneous QHS  . isosorbide dinitrate  10 mg Oral Daily  . pantoprazole  40 mg Oral Daily  . potassium chloride  40 mEq Oral Daily  . rosuvastatin  40 mg Oral Daily  . sodium chloride  flush  10-40 mL Intracatheter Q12H  . tamsulosin  0.4 mg Oral Daily   Continuous Infusions: . furosemide 62 mL/hr at  12/01/19 1000  . heparin 1,000 Units/hr (12/01/19 1000)  . milrinone 0.125 mcg/kg/min (12/01/19 1000)  . potassium chloride 10 mEq (12/01/19 1015)   PRN Meds:.acetaminophen, alum & mag hydroxide-simeth, nitroGLYCERIN, polyethylene glycol, senna-docusate, sodium chloride, sodium chloride flush  Antibiotics  :   Anti-infectives (From admission, onward)   None       Objective:   Vitals:   12/01/19 0830 12/01/19 0900 12/01/19 0930 12/01/19 1000  BP: 97/80 111/80 101/69 (!) 93/58  Pulse: (!) 29 73 (!) 48 (!) 125  Resp: 18 19 16  (!) 22  Temp: 97.9 F (36.6 C) (!) 97.5 F (36.4 C) (!) 97.5 F (36.4 C) 97.7 F (36.5 C)  TempSrc:      SpO2: 96% 91% 91% 91%  Weight:      Height:        SpO2: 91 % O2 Flow Rate (L/min): 2 L/min  Wt Readings from Last 3 Encounters:  12/01/19 117.8 kg  10/24/19 118.8 kg  10/17/19 119.3 kg     Intake/Output Summary (Last 24 hours) at 12/01/2019 1024 Last data filed at 12/01/2019 1000 Gross per 24 hour  Intake 715.83 ml  Output 3825 ml  Net -3109.17 ml     Physical Exam  Sleeping but easily arousable, No new F.N deficits, +ve aortic systolic murmur, irregular rhythm, 1-2 + leg edema with Unna boots, right IJ Swan-Ganz catheter in place [place].AT,PERRAL Supple Neck,No JVD, No cervical lymphadenopathy appriciated.  Symmetrical Chest wall movement, Good air movement bilaterally, CTAB RRR,No Gallops, Rubs or new Murmurs, No Parasternal Heave +ve B.Sounds, Abd Soft, No tenderness, No organomegaly appriciated, No rebound - guarding or rigidity. No Cyanosis, Clubbing or edema, No new Rash or bruise    Data Review:    Recent Labs  Lab 11/26/19 0120 11/27/19 0058 11/28/19 0257 11/29/19 0718 12/01/19 0453  WBC 7.2 6.4 8.0 8.2 8.6  HGB 13.4 12.7* 13.9 13.2 12.3*  HCT 44.2 40.0 44.5 43.1 39.8  PLT 312 291 330 354 285  MCV 99.8 95.9 96.1 97.1 95.4  MCH 30.2 30.5 30.0 29.7 29.5  MCHC 30.3 31.8 31.2 30.6 30.9  RDW 16.8* 16.7* 16.9* 17.1* 17.2*   LYMPHSABS  --   --   --  1.1  --   MONOABS  --   --   --  1.6*  --   EOSABS  --   --   --  0.1  --   BASOSABS  --   --   --  0.0  --     Recent Labs  Lab 11/26/19 0120 11/26/19 0120 11/26/19 0450 11/26/19 0605 11/27/19 0058 11/28/19 0257 11/29/19 0718 11/30/19 0414 12/01/19 0453  NA 133*   < >  --   --  135 133* 134* 133* 136  K 4.7   < >  --   --  3.2* 3.2* 4.4 3.2* 2.9*  CL 81*   < >  --   --  84* 83* 83* 85* 89*  CO2 42*   < >  --   --  35* 32 35* 34* 34*  GLUCOSE 149*   < >  --   --  174* 99 65* 68* 132*  BUN 53*   < >  --   --  53* 53* 55* 55* 47*  CREATININE 3.15*   < >  --   --  2.53* 2.52* 2.83* 2.70* 2.68*  CALCIUM 8.7*   < >  --   --  8.6* 8.9 9.0 8.7* 8.4*  AST  --   --   --   --  20 23 37 34 28  ALT  --   --   --   --  12 14 20 19 18   ALKPHOS  --   --   --   --  110 117 120 127* 133*  BILITOT  --   --   --   --  1.7* 1.3* 1.5* 1.5* 1.6*  ALBUMIN  --   --   --   --  2.9* 3.0* 2.9* 2.7* 2.7*  MG  --   --   --   --  2.6* 2.7* 3.1* 3.1* 2.9*  INR 2.0*  --   --   --   --   --   --   --   --   HGBA1C  --   --   --  7.2*  --   --   --   --   --   BNP  --   --  882.5*  --  1,036.2* 1,167.5* 872.9* 911.8*  --    < > = values in this interval not displayed.    Recent Labs  Lab 11/26/19 0450 11/26/19 0730 11/27/19 0058 11/28/19 0257 11/29/19 0718 11/30/19 0414  BNP 882.5*  --  1,036.2* 1,167.5* 872.9* 911.8*  SARSCOV2NAA  --  NEGATIVE  --   --   --   --     ------------------------------------------------------------------------------------------------------------------ No results for input(s): CHOL, HDL, LDLCALC, TRIG, CHOLHDL, LDLDIRECT in the last 72 hours.  Lab Results  Component Value Date   HGBA1C 7.2 (H) 11/26/2019   ------------------------------------------------------------------------------------------------------------------ No results for input(s): TSH, T4TOTAL, T3FREE, THYROIDAB in the last 72 hours.  Invalid input(s):  FREET3 ------------------------------------------------------------------------------------------------------------------ No results for input(s): VITAMINB12, FOLATE, FERRITIN, TIBC, IRON, RETICCTPCT in the last 72 hours.  Coagulation profile Recent Labs  Lab 11/26/19 0120  INR 2.0*    No results for input(s): DDIMER in the last 72 hours.  Cardiac Enzymes No results for input(s): CKMB, TROPONINI, MYOGLOBIN in the last 168 hours.  Invalid input(s): CK ------------------------------------------------------------------------------------------------------------------    Component Value Date/Time   BNP 911.8 (H) 11/30/2019 0414    Micro Results Recent Results (from the past 240 hour(s))  SARS CORONAVIRUS 2 (TAT 6-24 HRS) Nasopharyngeal Nasopharyngeal Swab     Status: None   Collection Time: 11/26/19  7:30 AM   Specimen: Nasopharyngeal Swab  Result Value Ref Range Status   SARS Coronavirus 2 NEGATIVE NEGATIVE Final    Comment: (NOTE) SARS-CoV-2 target nucleic acids are NOT DETECTED. The SARS-CoV-2 RNA is generally detectable in upper and lower respiratory specimens during the acute phase of infection. Negative results do not preclude SARS-CoV-2 infection, do not rule out co-infections with other pathogens, and should not be used as the sole basis for treatment or other patient management decisions. Negative results must be combined with clinical observations, patient history, and epidemiological information. The expected result is Negative. Fact Sheet for Patients: SugarRoll.be Fact Sheet for Healthcare Providers: https://www.woods-mathews.com/ This test is not yet approved or cleared by the [place] FDA and  has been authorized for detection and/or diagnosis of SARS-CoV-2 by FDA under an Emergency Use Authorization (EUA). This EUA will remain  in effect (meaning this test can be used) for the duration of the COVID-19 declaration  under Section 56 4(b)(1) of the Act,  21 U.S.C. section 360bbb-3(b)(1), unless the authorization is terminated or revoked sooner. Performed at [facility], [place] [address], [place], [place] [address]     Radiology Reports DG Chest 1 View  Result Date: 11/30/2019 CLINICAL DATA:  Pulmonary artery catheter insertion. EXAM: CHEST  1 VIEW COMPARISON:  Radiograph 11/26/2019 FINDINGS: Right internal jugular Swan-Ganz catheter tip in the region of the right pulmonary outflow tract. Post median sternotomy and TAVR. Left-sided pacemaker remains in place. Cardiomegaly with equivocal increase from prior exam. Worsening hazy opacity at the lung bases consistent with increasing pleural effusions. Developing interstitial opacities/pulmonary edema. No evidence of pneumothorax. IMPRESSION: 1. Right internal jugular Swan-Ganz catheter tip in the region of the right pulmonary outflow tract. 2. Cardiomegaly with worsening pleural effusions and developing pulmonary edema. Electronically Signed   By: Keith Rake M.D.   On: 11/30/2019 16:09   DG Chest 2 View  Result Date: 11/26/2019 CLINICAL DATA:  Chest pain and shortness of breath EXAM: CHEST - 2 VIEW COMPARISON:  08/25/2019 FINDINGS: Small pleural effusions. Moderate cardiomegaly. Aortic valve replacement. Unchanged position of left chest wall AICD single lead. No focal airspace consolidation or pulmonary edema. IMPRESSION: Cardiomegaly and small bilateral pleural effusions. Electronically Signed   By: Ulyses Jarred M.D.   On: 11/26/2019 01:58   ECHOCARDIOGRAM COMPLETE  Result Date: 11/26/2019    ECHOCARDIOGRAM REPORT   Patient Name:   KYSHAUN BARNETTE Date of Exam: 11/26/2019 Medical Rec #:  [id number]       Height:       70.0 in Accession #:    [id number]      Weight:       275.6 lb Date of Birth:  10/27/1950       BSA:          2.392 m Patient Age:    53 years        BP:           107/75 mmHg Patient Gender: M               HR:           113 bpm. Exam  Location:  Inpatient Procedure: 2D Echo and Intracardiac Opacification Agent Indications:    CHF-Acute Systolic 470.96 / G83.66  History:        Patient has prior history of Echocardiogram examinations, most                 recent 03/04/2019. CAD and Previous Myocardial Infarction, Prior                 CABG and Defibrillator, Arrythmias:Atrial Fibrillation; Risk                 Factors:Dyslipidemia, Diabetes and Sleep Apnea. Ischemic                 cardiomyopathy.                 Aortic Valve: 29 mm Edwards Sapien prosthetic, stented (TAVR)                 valve is present in the aortic position. Procedure Date:                 01/04/2018.  Sonographer:    Darlina Sicilian RDCS Referring Phys: [id number] [place]  1. Left ventricular ejection fraction, by estimation, is <20%. The left ventricle has severely decreased function. The left ventricle demonstrates global hypokinesis. The left ventricular internal  cavity size was moderately to severely dilated. Left ventricular diastolic function could not be evaluated.  2. Right ventricular systolic function is moderately reduced. The right ventricular size is moderately enlarged. There is moderately elevated pulmonary artery systolic pressure.  3. Left atrial size was moderately dilated.  4. The mitral valve is normal in structure. Mild mitral valve regurgitation. No evidence of mitral stenosis.  5. The aortic valve has been repaired/replaced. Aortic valve regurgitation is not visualized. There is a 29 mm Edwards Sapien prosthetic (TAVR) valve present in the aortic position. Procedure Date: 01/04/2018.  6. Mild pulmonic stenosis.  7. The inferior vena cava is dilated in size with <50% respiratory variability, suggesting right atrial pressure of 15 mmHg. Comparison(s): No significant change from prior study. FINDINGS  Left Ventricle: Left ventricular ejection fraction, by estimation, is <20%. The left ventricle has severely decreased function. The left  ventricle demonstrates global hypokinesis. Definity contrast agent was given IV to delineate the left ventricular endocardial borders. The left ventricular internal cavity size was moderately to severely dilated. There is no left ventricular hypertrophy. Left ventricular diastolic function could not be evaluated. Right Ventricle: The right ventricular size is moderately enlarged. No increase in right ventricular wall thickness. Right ventricular systolic function is moderately reduced. There is moderately elevated pulmonary artery systolic pressure. The tricuspid  regurgitant velocity is 3.02 m/s, and with an assumed right atrial pressure of 15 mmHg, the estimated right ventricular systolic pressure is 37.6 mmHg. Left Atrium: Left atrial size was moderately dilated. Right Atrium: Right atrial size was not well visualized. Pericardium: There is no evidence of pericardial effusion. Mitral Valve: The mitral valve is normal in structure. Mild mitral annular calcification. Mild mitral valve regurgitation. No evidence of mitral valve stenosis. Tricuspid Valve: The tricuspid valve is normal in structure. Tricuspid valve regurgitation is mild . No evidence of tricuspid stenosis. Aortic Valve: The aortic valve has been repaired/replaced. Aortic valve regurgitation is not visualized. Aortic valve mean gradient measures 4.0 mmHg. Aortic valve peak gradient measures 7.7 mmHg. Aortic valve area, by VTI measures 3.69 cm. There is a 29 mm Edwards Sapien prosthetic, stented (TAVR) valve present in the aortic position. Procedure Date: 01/04/2018. Pulmonic Valve: The pulmonic valve was grossly normal. Pulmonic valve regurgitation is mild to moderate. Mild pulmonic stenosis. Aorta: The aortic root and ascending aorta are structurally normal, with no evidence of dilitation. Venous: The inferior vena cava is dilated in size with less than 50% respiratory variability, suggesting right atrial pressure of 15 mmHg. IAS/Shunts: No atrial  level shunt detected by color flow Doppler. Additional Comments: A pacer wire is visualized in the right ventricle and right atrium.  LEFT VENTRICLE PLAX 2D LVIDd:         6.29 cm LVIDs:         5.56 cm LV PW:         1.02 cm LV IVS:        1.05 cm LVOT diam:     2.70 cm LV SV:         85 LV SV Index:   36 LVOT Area:     5.73 cm  LV Volumes (MOD) LV vol d, MOD A2C: 341.0 ml LV vol d, MOD A4C: 256.0 ml LV vol s, MOD A2C: 270.0 ml LV vol s, MOD A4C: 206.0 ml LV SV MOD A2C:     71.0 ml LV SV MOD A4C:     256.0 ml LV SV MOD BP:      62.7  ml RIGHT VENTRICLE TAPSE (M-mode): 1.5 cm LEFT ATRIUM              Index LA diam:        5.50 cm  2.30 cm/m LA Vol (A2C):   110.0 ml 45.99 ml/m LA Vol (A4C):   135.0 ml 56.44 ml/m LA Biplane Vol: 127.0 ml 53.09 ml/m  AORTIC VALVE AV Area (Vmax):    4.34 cm AV Area (Vmean):   4.36 cm AV Area (VTI):     3.69 cm AV Vmax:           138.50 cm/s AV Vmean:          94.050 cm/s AV VTI:            0.231 m AV Peak Grad:      7.7 mmHg AV Mean Grad:      4.0 mmHg LVOT Vmax:         105.00 cm/s LVOT Vmean:        71.600 cm/s LVOT VTI:          0.149 m LVOT/AV VTI ratio: 0.65  AORTA Ao Root diam: 3.20 cm MITRAL VALVE               TRICUSPID VALVE MV Area (PHT): 6.43 cm    TR Peak grad:   36.5 mmHg MV Decel Time: 118 msec    TR Vmax:        302.00 cm/s MV E velocity: 99.15 cm/s                            SHUNTS                            Systemic VTI:  0.15 m                            Systemic Diam: 2.70 cm Buford Dresser MD Electronically signed by Buford Dresser MD Signature Date/Time: 11/26/2019/4:53:33 PM    Final     Time Spent in minutes  30   Lala Lund M.D on 12/01/2019 at 10:24 AM  To page go to [email or web address] - password [facility]

## 2019-12-01 NOTE — Progress Notes (Signed)
[place] for lovenox >> heparin Indication: atrial fibrillation  Allergies  Allergen Reactions  . Dyflex-G [Dyphylline-Guaifenesin] Hives  . Eliquis [Apixaban] Other (See Comments)    Side effects not a true allergy- he stated that it made him feel bad    Patient Measurements: Height: 5\' 10"  (177.8 cm) Weight: 123.6 kg (272 lb 8.2 oz) IBW/kg (Calculated) : 73  Vital Signs: Temp: 98.1 F (36.7 C) (04/04 2000) Temp Source: Core (04/04 2000) BP: 108/67 (04/04 2000) Pulse Rate: 129 (04/04 2000)  Labs: Recent Labs    11/28/19 0257 11/29/19 0718 11/30/19 0414 11/30/19 2333  HGB 13.9 13.2  --   --   HCT 44.5 43.1  --   --   PLT 330 354  --   --   APTT  --   --   --  79*  HEPARINUNFRC  --   --   --  0.55  CREATININE 2.52* 2.83* 2.70*  --     Estimated Creatinine Clearance: 34 mL/min (A) (by C-G formula based on SCr of 2.7 mg/dL (H)).   Medical History: Past Medical History:  Diagnosis Date  . AICD (automatic cardioverter/defibrillator) present 05/18/2016  . Anginal pain ([place])    occ none recent  . Aortic stenosis   . Arthritis   . CAD (coronary artery disease)   . Cardiomyopathy, ischemic   . CHF (congestive heart failure) ([place])   . Chronic back pain   . Diabetes mellitus without complication ([place])   . Elevated PSA   . History of kidney stones   . Hyperlipidemia   . Morbid obesity ([place])   . Myocardial infarction ([place])   . OSA (obstructive sleep apnea)    does not wear CPAP, -order run out told need to redo  test  . Paroxysmal atrial fibrillation ([place]) 09/29/2018   This was found on event monitor October 2019  . 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   29 mm Edwards Sapien 3 transcatheter heart valve placed via percutaneous right transfemoral approach    Assessment: 7 yom presented to the ED  with CP. Found to have an AKI. He is on chronic xarelto for afib with his last dose being on 3/30. He was started on heparin briefly (most recent dose adjustment to 1000 units/hr) and then was transitioned to lovenox temporarily for AKI. Pharmacy has been consulted to transition back to IV heparin. Last dose of Lovenox was given 4/3 at 1746  CBC has been stable and wnl. No bleeding noted.   4/5 AM update:  Heparin level and aPTT therapeutic   Goal of Therapy:  Heparin level 0.3-0.7 units/mL Monitor platelets by anticoagulation protocol: Yes   Plan:  Cont heparin at 1000 units/hr Confirmatory heparin level with AM labs Monitor for bleeding  Narda Bonds, PharmD, BCPS Clinical Pharmacist Phone: [phone]

## 2019-12-01 NOTE — Progress Notes (Signed)
Orthopedic Tech Progress Note Patient Details:  [name] [name] 1950-12-15 [id number]  Ortho Devices Type of Ortho Device: Haematologist Ortho Device/Splint Location: BLE Ortho Device/Splint Interventions: Ordered, Application   Post Interventions Patient Tolerated: Well Instructions Provided: Care of device   Janit Pagan 12/01/2019, 4:34 PM

## 2019-12-01 NOTE — Progress Notes (Signed)
I responded to spiritual care consult for pastoral support. Pt was resting and talking. He stated he was a little depressed. I offered him space to voice his feelings. He said hes been married over 16 years and has always had his wife by his side. He said he wishes she can stay with him overnight. He said with staff and spiritual support, he feels better. I prayed, offered words of encouragement and ministry of presence. He said his wife will be coming to see him in a few minutes. Chaplain available as needed.   Chaplain Resident Fidel Levy [phone]

## 2019-12-02 DIAGNOSIS — R0600 Dyspnea, unspecified: Secondary | ICD-10-CM

## 2019-12-02 DIAGNOSIS — F411 Generalized anxiety disorder: Secondary | ICD-10-CM

## 2019-12-02 DIAGNOSIS — N183 Chronic kidney disease, stage 3 unspecified: Secondary | ICD-10-CM

## 2019-12-02 DIAGNOSIS — Z7189 Other specified counseling: Secondary | ICD-10-CM

## 2019-12-02 DIAGNOSIS — Z515 Encounter for palliative care: Secondary | ICD-10-CM

## 2019-12-02 DIAGNOSIS — R570 Cardiogenic shock: Secondary | ICD-10-CM

## 2019-12-02 LAB — CBC WITH DIFFERENTIAL/PLATELET
Abs Immature Granulocytes: 0.03 10*3/uL (ref 0.00–0.07)
Basophils Absolute: 0 10*3/uL (ref 0.0–0.1)
Basophils Relative: 0 %
Eosinophils Absolute: 0.2 10*3/uL (ref 0.0–0.5)
Eosinophils Relative: 2 %
HCT: 39.7 % (ref 39.0–52.0)
Hemoglobin: 12.2 g/dL — ABNORMAL LOW (ref 13.0–17.0)
Immature Granulocytes: 0 %
Lymphocytes Relative: 11 %
Lymphs Abs: 1 10*3/uL (ref 0.7–4.0)
MCH: 29.6 pg (ref 26.0–34.0)
MCHC: 30.7 g/dL (ref 30.0–36.0)
MCV: 96.4 fL (ref 80.0–100.0)
Monocytes Absolute: 1.7 10*3/uL — ABNORMAL HIGH (ref 0.1–1.0)
Monocytes Relative: 18 %
Neutro Abs: 6.3 10*3/uL (ref 1.7–7.7)
Neutrophils Relative %: 69 %
Platelets: 265 10*3/uL (ref 150–400)
RBC: 4.12 MIL/uL — ABNORMAL LOW (ref 4.22–5.81)
RDW: 17.2 % — ABNORMAL HIGH (ref 11.5–15.5)
WBC: 9.2 10*3/uL (ref 4.0–10.5)
nRBC: 0 % (ref 0.0–0.2)

## 2019-12-02 LAB — GLUCOSE, CAPILLARY
Glucose-Capillary: 155 mg/dL — ABNORMAL HIGH (ref 70–99)
Glucose-Capillary: 166 mg/dL — ABNORMAL HIGH (ref 70–99)
Glucose-Capillary: 177 mg/dL — ABNORMAL HIGH (ref 70–99)
Glucose-Capillary: 153 mg/dL — ABNORMAL HIGH (ref 70–99)

## 2019-12-02 LAB — COMPREHENSIVE METABOLIC PANEL
ALT: 18 U/L (ref 0–44)
AST: 25 U/L (ref 15–41)
Albumin: 2.9 g/dL — ABNORMAL LOW (ref 3.5–5.0)
Alkaline Phosphatase: 135 U/L — ABNORMAL HIGH (ref 38–126)
Anion gap: 15 (ref 5–15)
BUN: 43 mg/dL — ABNORMAL HIGH (ref 8–23)
CO2: 30 mmol/L (ref 22–32)
Calcium: 8.3 mg/dL — ABNORMAL LOW (ref 8.9–10.3)
Chloride: 90 mmol/L — ABNORMAL LOW (ref 98–111)
Creatinine, Ser: 2.85 mg/dL — ABNORMAL HIGH (ref 0.61–1.24)
GFR calc Af Amer: 25 mL/min — ABNORMAL LOW (ref >60)
GFR calc non Af Amer: 22 mL/min — ABNORMAL LOW (ref >60)
Glucose, Bld: 165 mg/dL — ABNORMAL HIGH (ref 70–99)
Potassium: 2.8 mmol/L — ABNORMAL LOW (ref 3.5–5.1)
Sodium: 135 mmol/L (ref 135–145)
Total Bilirubin: 1.5 mg/dL — ABNORMAL HIGH (ref 0.3–1.2)
Total Protein: 7.1 g/dL (ref 6.5–8.1)

## 2019-12-02 LAB — HEPARIN LEVEL (UNFRACTIONATED): Heparin Unfractionated: 0.25 IU/mL — ABNORMAL LOW (ref 0.30–0.70)

## 2019-12-02 LAB — MAGNESIUM: Magnesium: 2.6 mg/dL — ABNORMAL HIGH (ref 1.7–2.4)

## 2019-12-02 LAB — COOXEMETRY PANEL
Carboxyhemoglobin: 1.5 % (ref 0.5–1.5)
Methemoglobin: 0.6 % (ref 0.0–1.5)
O2 Saturation: 53.3 %
Total hemoglobin: 12.1 g/dL (ref 12.0–16.0)

## 2019-12-02 LAB — BRAIN NATRIURETIC PEPTIDE: B Natriuretic Peptide: 1216.3 pg/mL — ABNORMAL HIGH (ref 0.0–100.0)

## 2019-12-02 MED ORDER — ALPRAZOLAM 0.25 MG PO TABS
0.2500 mg | ORAL_TABLET | Freq: Three times a day (TID) | ORAL | Status: DC | PRN
  Administered 2019-12-02: 0.25 mg via ORAL
  Filled 2019-12-02: qty 1

## 2019-12-02 MED ORDER — ZOLPIDEM TARTRATE 5 MG PO TABS
5.0000 mg | ORAL_TABLET | Freq: Once | ORAL | Status: AC
  Administered 2019-12-02: 03:00:00 5 mg via ORAL
  Filled 2019-12-02: qty 1

## 2019-12-02 MED ORDER — POTASSIUM CHLORIDE CRYS ER 20 MEQ PO TBCR
40.0000 meq | EXTENDED_RELEASE_TABLET | Freq: Once | ORAL | Status: AC
  Administered 2019-12-02: 40 meq via ORAL
  Filled 2019-12-02: qty 2

## 2019-12-02 MED ORDER — POTASSIUM CHLORIDE CRYS ER 20 MEQ PO TBCR
40.0000 meq | EXTENDED_RELEASE_TABLET | Freq: Three times a day (TID) | ORAL | Status: DC
  Administered 2019-12-02 – 2019-12-03 (×4): 40 meq via ORAL
  Filled 2019-12-02 (×4): qty 2

## 2019-12-02 MED ORDER — OXYCODONE HCL 5 MG PO TABS
5.0000 mg | ORAL_TABLET | ORAL | Status: DC | PRN
  Administered 2019-12-02 – 2019-12-03 (×3): 5 mg via ORAL
  Filled 2019-12-02 (×3): qty 1

## 2019-12-02 MED ORDER — AMIODARONE HCL 200 MG PO TABS
400.0000 mg | ORAL_TABLET | Freq: Two times a day (BID) | ORAL | Status: DC
  Administered 2019-12-02 – 2019-12-03 (×2): 400 mg via ORAL
  Filled 2019-12-02 (×3): qty 2

## 2019-12-02 MED ORDER — RIVAROXABAN 15 MG PO TABS
15.0000 mg | ORAL_TABLET | Freq: Every day | ORAL | Status: DC
  Administered 2019-12-02: 15 mg via ORAL
  Filled 2019-12-02 (×3): qty 1

## 2019-12-02 MED ORDER — POTASSIUM CHLORIDE 10 MEQ/50ML IV SOLN
10.0000 meq | INTRAVENOUS | Status: DC
  Administered 2019-12-02 (×2): 10 meq via INTRAVENOUS
  Filled 2019-12-02 (×3): qty 50

## 2019-12-02 NOTE — Progress Notes (Addendum)
[place] for lovenox >> heparin >rivarox Indication: atrial fibrillation  Allergies  Allergen Reactions  . Dyflex-G [Dyphylline-Guaifenesin] Hives  . Ambien [Zolpidem Tartrate] Other (See Comments)    Increased lethargy during daytime  . Eliquis [Apixaban] Other (See Comments)    Side effects not a true allergy- he stated that it made him feel bad    Patient Measurements: Height: 5\' 10"  (177.8 cm) Weight: 118 kg (260 lb 2.3 oz) IBW/kg (Calculated) : 73  Vital Signs: Temp: 98.4 F (36.9 C) (04/06 1000) Temp Source: Core (04/06 0430) BP: 108/84 (04/06 1000) Pulse Rate: 72 (04/06 1000)  Labs: Recent Labs    11/30/19 0414 11/30/19 2333 12/01/19 0453 12/02/19 0408  HGB  --   --  12.3* 12.2*  HCT  --   --  39.8 39.7  PLT  --   --  285 265  APTT  --  79*  --   --   HEPARINUNFRC  --  0.55 0.57 0.25*  CREATININE 2.70*  --  2.68* 2.85*    Estimated Creatinine Clearance: 31.5 mL/min (A) (by C-G formula based on SCr of 2.85 mg/dL (H)).   Medical History: Past Medical History:  Diagnosis Date  . AICD (automatic cardioverter/defibrillator) present 05/18/2016  . Anginal pain ([place])    occ none recent  . Aortic stenosis   . Arthritis   . CAD (coronary artery disease)   . Cardiomyopathy, ischemic   . CHF (congestive heart failure) ([place])   . Chronic back pain   . Diabetes mellitus without complication ([place])   . Elevated PSA   . History of kidney stones   . Hyperlipidemia   . Morbid obesity ([place])   . Myocardial infarction ([place])   . OSA (obstructive sleep apnea)    does not wear CPAP, -order run out told need to redo  test  . Paroxysmal atrial fibrillation ([place]) 09/29/2018   This was found on event monitor October 2019  . 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 Found to have an AKI. He is on chronic xarelto for afib with his last dose being on 3/30. He was started on heparin briefly (most recent dose adjustment to 1000 units/hr) and then was transitioned to lovenox while inpatient for AKI. Pharmacy has been consulted to transition back to IV heparin for possible procedures in ICU. Last dose of Lovenox was given 4/3 at 1746  CBC has been stable and wnl. No bleeding noted.  Heparin drip 1000 uts/hr HL 0.25 slightly < goal but all other levels on this same dose have been stable so will not make changes today - also possible home with hospice so could change back to oral AC.     Goal of Therapy:  Heparin level 0.3-0.7 units/mL Monitor platelets by anticoagulation protocol: Yes   Plan:  Cont heparin at 1000 units/hr Daily HL, CBC  Monitor for bleeding   PM Updated plan Home with hospice tomorrow Stop eparin  Start rivaroxaban 15mg  qd Stop amio iv > amio po 400mg  BID    Bonnita Nasuti Pharm.D. CPP, BCPS Clinical Pharmacist [phone] 12/02/2019 12:17 PM

## 2019-12-02 NOTE — Consult Note (Signed)
Consultation Note Date: 12/02/19  Patient Name: [name] [name]  DOB: 03-09-1951  MRN: [id number]  Age / Sex: 69 y.o., male  PCP: Kathyrn Drown, MD Referring Physician: Thurnell Lose, MD  Reason for Consultation: Establishing goals of care  HPI/Patient Profile: 69 y.o. male  with past medical history of systolic heart failure due to ischemic cardiomyopathy EF 15%, CAD s/p CABG x6 in 2001, aortic stenosis s/p TAVR 2019, paroxysmal atrial fibrillation, ICD, OSA not using CPAP, hypertension, hyperlipidemia, CKD, obesity, Type 2 DM admitted on 11/26/2019 with chest pain and progressive lower extremity edema. Patient with cardiogenic shock being following by heart failure team. Receiving IV lasix and on milrinone gtt. Worsening kidney function. Co-ox 53% with CVP 20. Palliative medicine consultation for goals of care/terminal care. Patient has spoken of wishes to stop aggressive therapies and transition to comfort/home hospice.   Clinical Assessment and Goals of Care:  I have reviewed medical records, discussed in detail with RN, and met with patient and wife [name] [name]) at bedside to discuss goals of care.   I introduced Palliative Medicine as specialized medical care for people living with serious illness. It focuses on providing relief from the symptoms and stress of a serious illness.    Prior to admission, living home with wife. They have 3 children. Discussed extensive cardiac history.   Discussed course of hospitalization including diagnoses, interventions, plan of care. Discussed poor prognosis.   Patient is adamant that he wishes to go home and be with his wife and family. Patient and wife are interested in home hospice services. Patient states 'just get me through the night' and is eager to get home by tomorrow afternoon. Wife states "we don't want him to suffer." Explained concern that he could decline  within days after milrinone is discontinued.   Discussed recommendation to start symptom management medications prior to discharge (oxycodone and xanax). Patient/wife agreeable and he is asking for oxycodone now. Instructed RN to give oxycodone.   Hospice philosophy and options discussed. Explained to wife that if symptoms worsen at home, he can be moved to hospice facility. They live in [place] and requesting [facility]. He will need hospital bed, oxygen, and BSC.  Patient/wife confirmed DNR code status   Questions and concerns were addressed. Patient eager to start the referral ASAP. PMT contact information given to wife.    **Shortly after, spoke with wife via telephone about ICD deactivation. She understands and does not wish for him to suffer when he passes.     SUMMARY OF RECOMMENDATIONS    Patient and wife understand diagnoses and poor prognosis. He is eager to discharge home as soon as possible with home hospice.   Discussed with TOC RN. Patient lives in [place]. Requesting [facility] agency. Home with hospice. Wife understands he can transfer to hospice facility if necessary. Likely discharge home 4/7.  Symptom management medications initiated.  Continue milrinone and lasix per HF team until discharge.  Deactivate ICD when patient is ready.  Code Status/Advance Care Planning:  DNR  Symptom Management:   Oxycodone 47m PO q4h prn pain/dyspnea/air hunger/tachypnea  Xanax 0.269mPO TID prn anxiety   Palliative Prophylaxis:   Delirium Protocol, Frequent Pain Assessment, Oral Care and Turn Reposition  Psycho-social/Spiritual:   Desire for further Chaplaincy support: yes  Additional Recommendations: Caregiving  Support/Resources, Compassionate Wean Education and Education on Hospice  Prognosis:   Poor prognosis: weeks if not days with end-stage heart failure, cardiogenic shock, worsening renal failure  Discharge Planning: Home with Hospice       Primary Diagnoses: Present on Admission: . Hyperlipemia . Cardiomyopathy, ischemic . Obstructive sleep apnea . Type 2 diabetes with nephropathy ([facility]. Essential hypertension . Dyspnea . CKD (chronic kidney disease) stage 3, GFR 30-59 ml/min . AICD (automatic cardioverter/defibrillator) present . Chronic combined systolic and diastolic CHF (congestive heart failure) ([facility]. Prolonged Q-T interval on ECG . Constipation . PAF (paroxysmal atrial fibrillation) (HC[facility]. Acute kidney injury ([facility]  I have reviewed the medical record, interviewed the patient and family, and examined the patient. The following aspects are pertinent.  Past Medical History:  Diagnosis Date  . AICD (automatic cardioverter/defibrillator) present 05/18/2016  . Anginal pain ([facility]CPly[place]   occ none recent  . Aortic stenosis   . Arthritis   . CAD (coronary artery disease)   . Cardiomyopathy, ischemic   . CHF (congestive heart failure) (HCTeton  . Chronic back pain   . Diabetes mellitus without complication (HCPleak  . Elevated PSA   . History of kidney stones   . Hyperlipidemia   . Morbid obesity (HCCassville  . Myocardial infarction (HCNetawaka  . OSA (obstructive sleep apnea)    does not wear CPAP, -order run out told need to redo  test  . Paroxysmal atrial fibrillation (HCRobbins2/09/2018   This was found on event monitor October 2019  . 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 Marital status: Married    Spouse name: Not on file  . Number of children: Not on file  . Years of education: Not on file  . Highest education level: Not on file  Occupational History  . Occupation: retired    Comment: [place]  Comment: [employer]  Tobacco  Use  . Smoking status: Former Smoker    Quit date: 08/29/1999    Years since quitting: 20.2  . Smokeless tobacco: Never Used  . Tobacco comment: couldn't remember date  Substance and Sexual Activity  . Alcohol use: No    Alcohol/week: 0.0 standard drinks  . Drug use: No  . Sexual activity: Not Currently  Other Topics Concern  . Not on file  Social History Narrative  . Not on file   Social Determinants of Health   Financial Resource Strain:   . Difficulty of Paying Living Expenses:   Food Insecurity:   . Worried About RuCharity fundraisern the Last Year:   . RaArboriculturistn the Last Year:   Transportation Needs:   . LaFilm/video editorMedical):   . Marland Kitchenack of Transportation (Non-Medical):   Physical Activity:   . Days of Exercise per Week:   . Minutes of Exercise per Session:  Stress:   . Feeling of Stress :   Social Connections:   . Frequency of Communication with Friends and Family:   . Frequency of Social Gatherings with Friends and Family:   . Attends Religious Services:   . Active Member of Clubs or Organizations:   . Attends Archivist Meetings:   Marland Kitchen Marital Status:    Family History  Adopted: Yes  Problem Relation Age of Onset  . Diabetes Mother   . CAD Mother   . Colon cancer Neg Hx   . Colon polyps Neg Hx    Scheduled Meds: . aspirin EC  81 mg Oral Daily  . Chlorhexidine Gluconate Cloth  6 each Topical Daily  . Chlorhexidine Gluconate Cloth  6 each Topical Daily  . insulin aspart  0-9 Units Subcutaneous TID WC  . insulin detemir  20 Units Subcutaneous QHS  . pantoprazole  40 mg Oral Daily  . potassium chloride  40 mEq Oral TID  . rosuvastatin  40 mg Oral Daily  . sodium chloride flush  10-40 mL Intracatheter Q12H  . tamsulosin  0.4 mg Oral Daily   Continuous Infusions: . amiodarone 30 mg/hr (12/02/19 1400)  . furosemide 120 mg (12/02/19 1441)  . heparin 1,000 Units/hr (12/02/19 1400)  . milrinone 0.125 mcg/kg/min (12/02/19 1400)    PRN Meds:.acetaminophen, ALPRAZolam, alum & mag hydroxide-simeth, nitroGLYCERIN, oxyCODONE, polyethylene glycol, senna-docusate, sodium chloride, sodium chloride flush Medications Prior to Admission:  Prior to Admission medications   Medication Sig Start Date End Date Taking? Authorizing Provider  acetaminophen (TYLENOL) 500 MG tablet Take 1 tablet (500 mg total) by mouth 2 (two) times daily. Patient taking differently: Take 1,000 mg by mouth every 8 (eight) hours as needed for headache.  10/29/18  Yes Kathyrn Drown, MD  calcium carbonate (TUMS - DOSED IN MG ELEMENTAL CALCIUM) 500 MG chewable tablet Chew 2 tablets by mouth 2 (two) times daily as needed for indigestion or heartburn.    Yes [provider]  carvedilol (COREG) 3.125 MG tablet Take 1 tablet (3.125 mg total) by mouth 2 (two) times daily with a meal. 11/12/19  Yes Luking, Scott A, MD  FIBER PO Take 5 capsules by mouth daily.   Yes [provider]  HYDROcodone-acetaminophen (NORCO) 10-325 MG tablet Take one tablet po QID prn pain Patient taking differently: Take 1 tablet by mouth every 6 (six) hours as needed for moderate pain. Take one tablet po QID prn pain 10/24/19  Yes Luking, Scott A, MD  insulin aspart (NOVOLOG FLEXPEN) 100 UNIT/ML FlexPen Inject 3 Units into the skin 3 (three) times daily with meals. Patient taking differently: Inject 20 Units into the skin 3 (three) times daily with meals.  08/28/19  Yes Eugenie Filler, MD  Insulin Detemir (LEVEMIR FLEXTOUCH) 100 UNIT/ML Pen Inject 40 Units into the skin at bedtime. 08/28/19  Yes Eugenie Filler, MD  isosorbide dinitrate (ISORDIL) 30 MG tablet Take 1 tablet (30 mg total) by mouth daily. 08/28/19  Yes Eugenie Filler, MD  linaclotide (LINZESS) 290 MCG CAPS capsule TAKE (1) CAPSULE BY MOUTH EACH MORNING BEFORE BREAKFAST. Patient taking differently: Take 290 mcg by mouth daily before breakfast.  09/01/19  Yes Luking, Elayne Snare, MD  Menthol-Methyl Salicylate  (GOODSENSE MUSCLE RUB) 8-30 % CREA Apply 1 application topically daily as needed (muscle pain).   Yes [provider]  Multiple Vitamins-Minerals (CENTRUM SILVER PO) Take 1 tablet by mouth daily.    Yes [provider]  nitroGLYCERIN (NITROSTAT)  0.4 MG SL tablet PLACE 1 TABLET UNDER TONGUE FOR CHEST PAIN. MAY REPEAT EVERY 5 MIN UP TO 3 DOSES-NO RELIEF,CALL 911. Patient taking differently: Place 0.4 mg under the tongue every 5 (five) minutes as needed for chest pain.  11/03/19  Yes Sherren Mocha, MD  potassium chloride SA (KLOR-CON M20) 20 MEQ tablet Take 2 tablets (40 mEq total) by mouth 2 (two) times daily. 08/28/19  Yes Eugenie Filler, MD  rivaroxaban (XARELTO) 20 MG TABS tablet Take 1 tablet (20 mg total) by mouth daily with supper. 05/23/19  Yes Arnoldo Lenis, MD  rosuvastatin (CRESTOR) 40 MG tablet TAKE ONE TABLET BY MOUTH DAILY. Patient taking differently: Take 40 mg by mouth daily.  07/18/19  Yes Kathyrn Drown, MD  tamsulosin (FLOMAX) 0.4 MG CAPS capsule Take 1 capsule (0.4 mg total) by mouth daily. 09/01/19  Yes Kathyrn Drown, MD  torsemide (DEMADEX) 20 MG tablet Take 3 tablets po in the morning and 3 tablets po at 2 p.m. 10/31/19  Yes Luking, Elayne Snare, MD  traMADol (ULTRAM) 50 MG tablet TAKE 1 TABLET BY MOUTH EVERY 6 TO 8 HOURS AS NEEDED FOR HEADACHE. Patient taking differently: Take 50 mg by mouth every 6 (six) hours as needed (Headache).  10/14/19  Yes Kathyrn Drown, MD  trolamine salicylate (ASPERCREME) 10 % cream Apply 1 application topically 2 (two) times daily as needed for muscle pain.    Yes [provider]  blood glucose meter kit and supplies KIT Dispense based on patient and insurance preference. Test  up to four times daily as directed.Dx: E11.9 06/30/19   Kathyrn Drown, MD  metolazone (ZAROXOLYN) 2.5 MG tablet TAKE ONE TABLET BY MOUTH DAILY. Patient not taking: Reported on 11/26/2019 11/19/19   Arnoldo Lenis, MD  SURE COMFORT PEN NEEDLES  31G X 8 MM MISC USING 4 TIMES DAILY. 04/07/19   Kathyrn Drown, MD   Allergies  Allergen Reactions  . Dyflex-G [Dyphylline-Guaifenesin] Hives  . Ambien [Zolpidem Tartrate] Other (See Comments)    Increased lethargy during daytime  . Eliquis [Apixaban] Other (See Comments)    Side effects not a true allergy- he stated that it made him feel bad   Review of Systems  Constitutional: Positive for activity change.  Respiratory: Positive for shortness of breath.   Cardiovascular: Positive for leg swelling.  Neurological: Positive for weakness.   Physical Exam Vitals and nursing note reviewed.  Constitutional:      General: He is awake.     Appearance: He is ill-appearing.  HENT:     Head: Normocephalic and atraumatic.  Cardiovascular:     Rate and Rhythm: Rhythm irregularly irregular.  Pulmonary:     Effort: No tachypnea, accessory muscle usage or respiratory distress.     Comments: Dyspnea with exertion Skin:    General: Skin is warm and dry.     Coloration: Skin is ashen.  Neurological:     Mental Status: He is alert and oriented to person, place, and time.    Vital Signs: BP 100/78   Pulse 92   Temp 98.4 F (36.9 C)   Resp (!) 23   Ht _0  (1.778 m)   Wt 118 kg   SpO2 96%   BMI 37.33 kg/m  Pain Scale: 0-10 POSS *See Group Information*: 2-Acceptable,Slightly drowsy, easily aroused Pain Score: 0-No pain   SpO2: SpO2: 96 % O2 Device:SpO2: 96 % O2 Flow Rate: .O2 Flow Rate (L/min): 2 L/min  IO: Intake/output  summary:   Intake/Output Summary (Last 24 hours) at 12/02/2019 1537 Last data filed at 12/02/2019 1400 Gross per 24 hour  Intake 1353.78 ml  Output 800 ml  Net 553.78 ml    LBM: Last BM Date: 12/02/19 Baseline Weight: Weight: 125 kg Most recent weight: Weight: 118 kg     Palliative Assessment/Data: PPS 40%   Flowsheet Rows     Most Recent Value  Intake Tab  Referral Department  Hospitalist  Unit at Time of Referral  ICU  Palliative Care Primary  Diagnosis  Cardiac  Palliative Care Type  New Palliative care  Reason for referral  Clarify Goals of Care  Date first seen by Palliative Care  12/02/19  Clinical Assessment  Palliative Performance Scale Score  40%  Psychosocial & Spiritual Assessment  Palliative Care Outcomes  Patient/Family meeting held?  Yes  Who was at the meeting?  patient and wife  Palliative Care Outcomes  Clarified goals of care, Counseled regarding hospice, Provided end of life care assistance, Improved pain interventions, Improved non-pain symptom therapy, Provided psychosocial or spiritual support, Transitioned to hospice      Time In: 1340 Time Out: 1430 Time Total: 50 Greater than 50%  of this time was spent counseling and coordinating care related to the above assessment and plan.  Signed by:   Ihor Dow, DNP, FNP-C Palliative Medicine Team  Phone: [phone] Fax: [phone]   Please contact Palliative Medicine Team phone at [phone] for questions and concerns.  For individual provider: See Shea Evans

## 2019-12-02 NOTE — Progress Notes (Addendum)
Advanced Heart Failure Rounding Note   Subjective:    Remains on milrinone 0.125. Co-ox 53%   Creatine continues to rise, 2.68>>2.85. CVP 20   K 2.8   Swan #s not checked yet.   He is very tired, weak/fatigued and depressed. No resting dyspnea. He wishes to stop all aggressive therapies and transition to comfort care/ home hospice. Palliative care consult pending.   Objective:   Weight Range:  Vital Signs:   Temp:  [97.5 F (36.4 C)-98.4 F (36.9 C)] 98.4 F (36.9 C) (04/06 1000) Pulse Rate:  [39-151] 72 (04/06 1000) Resp:  [16-34] 19 (04/06 1000) BP: (76-138)/(53-124) 108/84 (04/06 1000) SpO2:  [87 %-100 %] 98 % (04/06 1000) Weight:  [948 kg] 118 kg (04/06 0500) Last BM Date: 12/02/19  Weight change: Filed Weights   11/30/19 0449 12/01/19 0400 12/02/19 0500  Weight: 123.6 kg 117.8 kg 118 kg    Intake/Output:   Intake/Output Summary (Last 24 hours) at 12/02/2019 1157 Last data filed at 12/02/2019 1100 Gross per 24 hour  Intake 1861.53 ml  Output 950 ml  Net 911.53 ml     Physical Exam: CVP 20  General:  Obese, fatigue appearing elderly WM. No respiratory distress HEENT: normal Neck: supple. Elevated JVP to ear. + Rt IJ Swan. Carotids 2+ bilat; no bruits. No lymphadenopathy or thryomegaly appreciated. Cor: PMI nondisplaced. Irregular rate & rhythm + s3 Lungs: decreased BS at the bases bilaterally  Abdomen: obese soft, nontender, nondistended. No hepatosplenomegaly. No bruits or masses. Good bowel sounds.  Extremities: no cyanosis, clubbing, rash, 2+ bilateral LE pitting edema up to thighs, + bilateral unna boots  Neuro: alert & orientedx3, cranial nerves grossly intact. moves all 4 extremities w/o difficulty. Affect pleasant   Telemetry: AF 110s, 10 beat run of NSVT Personally reviewed   Labs: Basic Metabolic Panel: Recent Labs  Lab 11/28/19 0257 11/28/19 0257 11/29/19 0718 11/29/19 0718 11/30/19 0414 12/01/19 0453 12/01/19 1611 12/02/19 0408    NA 133*  --  134*  --  133* 136  --  135  K 3.2*   < > 4.4  --  3.2* 2.9* 3.0* 2.8*  CL 83*  --  83*  --  85* 89*  --  90*  CO2 32  --  35*  --  34* 34*  --  30  GLUCOSE 99  --  65*  --  68* 132*  --  165*  BUN 53*  --  55*  --  55* 47*  --  43*  CREATININE 2.52*  --  2.83*  --  2.70* 2.68*  --  2.85*  CALCIUM 8.9   < > 9.0   < > 8.7* 8.4*  --  8.3*  MG 2.7*  --  3.1*  --  3.1* 2.9*  --  2.6*   < > = values in this interval not displayed.    Liver Function Tests: Recent Labs  Lab 11/28/19 0257 11/29/19 0718 11/30/19 0414 12/01/19 0453 12/02/19 0408  AST 23 37 34 28 25  ALT 14 20 19 18 18   ALKPHOS 117 120 127* 133* 135*  BILITOT 1.3* 1.5* 1.5* 1.6* 1.5*  PROT 7.4 7.1 6.8 6.6 7.1  ALBUMIN 3.0* 2.9* 2.7* 2.7* 2.9*   No results for input(s): LIPASE, AMYLASE in the last 168 hours. No results for input(s): AMMONIA in the last 168 hours.  CBC: Recent Labs  Lab 11/27/19 0058 11/28/19 0257 11/29/19 0718 12/01/19 0453 12/02/19 0408  WBC 6.4 8.0  8.2 8.6 9.2  NEUTROABS  --   --  5.5  --  6.3  HGB 12.7* 13.9 13.2 12.3* 12.2*  HCT 40.0 44.5 43.1 39.8 39.7  MCV 95.9 96.1 97.1 95.4 96.4  PLT 291 330 354 285 265    Cardiac Enzymes: No results for input(s): CKTOTAL, CKMB, CKMBINDEX, TROPONINI in the last 168 hours.  BNP: BNP (last 3 results) Recent Labs    11/29/19 0718 11/30/19 0414 12/02/19 0408  BNP 872.9* 911.8* 1,216.3*    ProBNP (last 3 results) No results for input(s): PROBNP in the last 8760 hours.    Other results:  Imaging: DG Chest 1 View  Result Date: 11/30/2019 CLINICAL DATA:  Pulmonary artery catheter insertion. EXAM: CHEST  1 VIEW COMPARISON:  Radiograph 11/26/2019 FINDINGS: Right internal jugular Swan-Ganz catheter tip in the region of the right pulmonary outflow tract. Post median sternotomy and TAVR. Left-sided pacemaker remains in place. Cardiomegaly with equivocal increase from prior exam. Worsening hazy opacity at the lung bases consistent with  increasing pleural effusions. Developing interstitial opacities/pulmonary edema. No evidence of pneumothorax. IMPRESSION: 1. Right internal jugular Swan-Ganz catheter tip in the region of the right pulmonary outflow tract. 2. Cardiomegaly with worsening pleural effusions and developing pulmonary edema. Electronically Signed   By: Keith Rake M.D.   On: 11/30/2019 16:09     Medications:     Scheduled Medications: . aspirin EC  81 mg Oral Daily  . Chlorhexidine Gluconate Cloth  6 each Topical Daily  . Chlorhexidine Gluconate Cloth  6 each Topical Daily  . insulin aspart  0-9 Units Subcutaneous TID WC  . insulin detemir  20 Units Subcutaneous QHS  . pantoprazole  40 mg Oral Daily  . potassium chloride  40 mEq Oral TID  . rosuvastatin  40 mg Oral Daily  . sodium chloride flush  10-40 mL Intracatheter Q12H  . tamsulosin  0.4 mg Oral Daily    Infusions: . amiodarone 30 mg/hr (12/02/19 1100)  . furosemide Stopped (12/01/19 1858)  . heparin 1,000 Units/hr (12/02/19 1100)  . milrinone 0.125 mcg/kg/min (12/02/19 1100)  . potassium chloride 10 mEq (12/02/19 1034)    PRN Medications: acetaminophen, alum & mag hydroxide-simeth, nitroGLYCERIN, polyethylene glycol, senna-docusate, sodium chloride, sodium chloride flush   Assessment/Plan:   1. Acute on chronic systolic HF -> cardiogenic shock  - due to longstanding  iCM  - echo 7/20 EF 15-20% moderate RV dysfunction - echo 11/16/19 EF 10-15% severe RV dysfunction - Reports baseline NYHA III symptoms - Now on milrinone 0.125. Co-ox marginal at 53% - He remains very tenuous in setting of BIV failure - remains markedly volume overloaded w/ worsening renal failure - Likely not VAD candidate with severe RV failure - hold b-blocker, ARB/ARNI with low output and AKI - has LBBB probably too advanced to benefit from CRT - Long talk with him and his wife about concerns over his prognosis and we also discussed code status. He is clear that if  he is getting worse he would want to die at home. He has now requested transition to comfort care and desires home hospice. Palliative care consult has been placed.  - Continue IV Lasix to help relieve congestion. supp K - Continue milrinone until transition home    2. CAD s/p CABG 2001 - admitted with CP. hstrop negative  - no further CP - cath 2/19 with patent grafts - not candidate for repeat cath with AKI  3. AS s/p TAVR - stable  4. Acute on  chronic CKD 3b - recent baseline 1.3-1.5 - now 2.83 -> 2.7 -> 2.7->2.85 - likely cardiorenal/shock   5. DM2 - per primary  6. PAF  - back in AF with RVR. Continue w/  IV amio. Switch to PO on d/c  - On xarelto at home.  - continue IV heparin . - No bleeding - Discussed dosing with PharmD personally.   7. Hypokalemia - K 2.8. supp aggressively    Length of Stay: [place] PA-C 12/02/2019, 11:57 AM  Advanced Heart Failure Team Pager [phone] (M-F; [place])  Please contact [facility] for night-coverage after hours (4p -7a ) and weekends on [email or web address]  Agree with above.   Remains on milrinone. Cardiac output still in shock range in setting of biventricular failure. In AF with RVR. Swan numbers c/w severe biventricular failure. Creatinine worse  General:  Weak appearing. +orthopneic HEENT: normal Neck: supple. RIJ swan. Carotids 2+ bilat; no bruits. No lymphadenopathy or thryomegaly appreciated. Cor: PMI nondisplaced. Irregular rate & rhythm. Tachy +s3 Lungs: + crackles Abdomen: obese soft, nontender, + distended. No hepatosplenomegaly. No bruits or masses. Good bowel sounds. Extremities: no cyanosis, clubbing, rash, 3+ edema Neuro: alert & orientedx3, cranial nerves grossly intact. moves all 4 extremities w/o difficulty. Affect pleasant  He is very clear that he does not want further aggressive care. He says he would like to go home and die among his family and friends.Given his end-stage biventricular  HF and AKI I think this is very reasonable.   We have been in contact with Hospice and he will be able to d/c home tomorrow. Will remove swan and transition back to oral meds.   Suspect he has weeks to live.   CRITICAL CARE Performed by: [name]  Total critical care time: 35 minutes  Critical care time was exclusive of separately billable procedures and treating other patients.  Critical care was necessary to treat or prevent imminent or life-threatening deterioration.  Critical care was time spent personally by me (independent of midlevel providers or residents) on the following activities: development of treatment plan with patient and/or surrogate as well as nursing, discussions with consultants, evaluation of patient's response to treatment, examination of patient, obtaining history from patient or surrogate, ordering and performing treatments and interventions, ordering and review of laboratory studies, ordering and review of radiographic studies, pulse oximetry and re-evaluation of patient's condition.    [name], MD  6:36 PM

## 2019-12-02 NOTE — Progress Notes (Signed)
PROGRESS NOTE                                                                                                                                                                                                             Patient Demographics:    [name] [name], is a 69 y.o. male, DOB - 11-Jun-1951, XKG:[id number]  Admit date - 11/26/2019   Admitting Physician Chauncey Mann, MD  Outpatient Primary MD for the patient is Kathyrn Drown, MD  LOS - 6  Chief Complaint  Patient presents with  . Chest Pain       Brief Narrative  69 year old Caucasian male who was admitted few hours ago with symptoms of CHF with known past medical history of ischemic cardiomyopathy with chronic systolic heart failure EF of 15%, s/p CABG, aortic stenosis s/p TAVR, paroxysmal A. fib [place] vas 2 score of greater than 3, hypertension, dyslipidemia, morbid obesity, type 2 diabetes, CKD 3 who presented to the hospital with chief complaints of chest pain and shortness of breath along with leg edema.   Subjective:   Patient in bed, denies any headache, is mildly short of breath, no chest or abdominal pain, no focal weakness, his wife is sitting bedside and he is stating clearly that he is not improved by tomorrow he wants to go home with hospice and die peacefully with his wife and family around him.   Assessment  & Plan :     1.  Acute on chronic systolic heart failure in a patient with EF less than 20% s/p TAVR for AS with reduced right-sided RV systolic function has AICD.  Patient has combination of both left and right-sided heart failure (this admission appears to be predominantly right-sided heart failure in nature due to more peripheral edema than lung involvement) , has massive leg edema and relatively spared lungs, he was diuresed with IV pushes however he became hypotensive and thereafter was transferred to ICU under cardiology care, overall this appears to be terminal CHF with extremely poor and guarded  prognosis.  He received right IJ Swan-Ganz catheter on 11/30/2019 and was started on IV Lasix drip along with milrinone drip, blood pressure too low to tolerate beta-blocker or any other blood pressure medication at this time, renal failure prevents the use of ACE/ARB or Entresto etc, although he diuresing but he is developing continued renal dysfunction, his SPO2 on COx remains around 53%, discussed with cardiology team Dr. Tempie Hoist and wife on 12/02/2019.  Consensus is 24 hours  more of treatment, if no improvement then discharged home with hospice, have already called palliative care to assist with it and requested case management to arrange for home hospice possible on 12/03/2019.  He is DNR now.   2.  Hypokalemia.  Replaced aggressively IV and oral.  3.  Paroxysmal atrial fibrillation.  Mali vas 2 score of at least 4.  Was in RVR with hypotension morning of 11/27/2019, blood pressure too low to tolerate beta-blocker calcium channel blockers, and placed him on IV amiodarone drip for 2 days and now in good rate control, now has been switched to oral amiodarone continue heparin drip for anticoagulation as tolerated.  4.  AKI on CKD 4 - improving with diuresis monitor.  Creatinine is anywhere between 1.5 and 2.5.  Currently certainly has AKI in the setting of hypotension and CHF.  For now needs continued diuresis, again poor prognosis.  5.  History of AS and TAVR procedure.  Supportive care.  6.  CAD with underlying ischemic cardiomyopathy s/p CABG.  No acute issues continue combination of aspirin, Imdur and statin for secondary prevention.  Once blood pressure improves low-dose beta-blocker if tolerated.  7. OSA - ? Patient refused outpt Study, undiagnosed.  Counseled patient advised to get another sleep study.  8.  Orthostatic hypotension.  Hold further diuretics, TED stockings/ UNNA  and monitor.      9. DM2 - on Lantus + ISS, Lantus dose dropped again on 11/30/2019 to avoid early morning  hypoglycemia.  Lab Results  Component Value Date   HGBA1C 7.2 (H) 11/26/2019    CBG (last 3)  Recent Labs    12/01/19 1510 12/01/19 2133 12/02/19 0646  GLUCAP 212* 189* 155*     He has refused working with PT and remains extremely argumentative with all staff members    Family Communication  : Wife Santiago Glad on 11/27/2019 - [phone] , 11/30/19 @ 10 am - message left.  Discussed with wife at bedside on 12/02/2019  Code Status : DNR  Disposition Plan  : Stay in the hospital for treatment of severe CHF exacerbation along with A. fib RVR, if no improvement patient and wife are willing to go home with hospice on 12/03/2019, have consulted palliative care and case management both to arrange for it.  Consults  : Cardiology, have called palliative care to see the patient as soon as possible to arrange for home hospice if no clinical improvement on 12/03/2019.  Procedures  :    Right IJ Swan-Ganz catheter placement 11/30/2019 by Dr. Haroldine Laws.    Echocardiogram -   1. Left ventricular ejection fraction, by estimation, is <20%. The left ventricle has severely decreased function. The left ventricle demonstrates  global hypokinesis. The left ventricular internal cavity size was moderately to severely dilated. Left ventricular diastolic function could not be evaluated.  2. Right ventricular systolic function is moderately reduced. The right ventricular size is moderately enlarged. There is moderately elevated  pulmonary artery systolic pressure.  3. Left atrial size was moderately dilated.  4. The mitral valve is normal in structure. Mild mitral valve regurgitation. No evidence of mitral stenosis.  5. The aortic valve has been repaired/replaced. Aortic valve regurgitation is not visualized. There is a 29 mm Edwards Sapien prosthetic (TAVR) valve present in the aortic position. Procedure Date: 01/04/2018.  6. Mild pulmonic stenosis.  7. The inferior vena cava is dilated in size with <50%  respiratory variability, suggesting right atrial pressure of 15 mmHg.    DVT Prophylaxis  :  Heparin drip  Lab Results  Component Value Date   PLT 265 12/02/2019    Diet :  Diet Order            Diet 2 gram sodium Room service appropriate? Yes; Fluid consistency: Thin; Fluid restriction: 1200 mL Fluid  Diet effective now               Inpatient Medications Scheduled Meds: . aspirin EC  81 mg Oral Daily  . Chlorhexidine Gluconate Cloth  6 each Topical Daily  . Chlorhexidine Gluconate Cloth  6 each Topical Daily  . insulin aspart  0-9 Units Subcutaneous TID WC  . insulin detemir  20 Units Subcutaneous QHS  . pantoprazole  40 mg Oral Daily  . potassium chloride  40 mEq Oral TID  . rosuvastatin  40 mg Oral Daily  . sodium chloride flush  10-40 mL Intracatheter Q12H  . tamsulosin  0.4 mg Oral Daily   Continuous Infusions: . amiodarone 30 mg/hr (12/02/19 0700)  . furosemide Stopped (12/01/19 1858)  . heparin 1,000 Units/hr (12/02/19 0700)  . milrinone 0.125 mcg/kg/min (12/02/19 0700)  . potassium chloride 10 mEq (12/02/19 0925)   PRN Meds:.acetaminophen, alum & mag hydroxide-simeth, nitroGLYCERIN, polyethylene glycol, senna-docusate, sodium chloride, sodium chloride flush  Antibiotics  :   Anti-infectives (From admission, onward)   None       Objective:   Vitals:   12/02/19 0645 12/02/19 0700 12/02/19 0800 12/02/19 0900  BP: (!) 125/112 112/83 (!) 114/95 (!) 135/94  Pulse: (!) 119 93 (!) 105 (!) 134  Resp: 19 (!) 21 (!) 26 (!) 28  Temp: 97.9 F (36.6 C) 97.9 F (36.6 C) 98.1 F (36.7 C) 98.2 F (36.8 C)  TempSrc:      SpO2: 96% 98% 97% 94%  Weight:      Height:        SpO2: 94 % O2 Flow Rate (L/min): 2 L/min  Wt Readings from Last 3 Encounters:  12/02/19 118 kg  10/24/19 118.8 kg  10/17/19 119.3 kg     Intake/Output Summary (Last 24 hours) at 12/02/2019 0957 Last data filed at 12/02/2019 0700 Gross per 24 hour  Intake 1767.15 ml  Output 1275 ml   Net 492.15 ml     Physical Exam  Sleeping but easily arousable, No new F.N deficits, +ve aortic systolic murmur, irregular rhythm, 2 + leg edema with Unna boots, right IJ Swan-Ganz catheter in place  [place].AT,PERRAL Supple Neck,No JVD, No cervical lymphadenopathy appriciated.  Symmetrical Chest wall movement, Good air movement bilaterally, CTAB RRR,No Gallops, Rubs or new Murmurs, No Parasternal Heave +ve B.Sounds, Abd Soft, No tenderness, No organomegaly appriciated, No rebound - guarding or rigidity. No Cyanosis, 2+ leg edema, No new Rash or bruise     Data Review:    Recent Labs  Lab 11/27/19 0058 11/28/19 0257 11/29/19 0718 12/01/19 0453 12/02/19 0408  WBC 6.4 8.0 8.2 8.6 9.2  HGB 12.7* 13.9 13.2 12.3* 12.2*  HCT 40.0 44.5 43.1 39.8 39.7  PLT 291 330 354 285 265  MCV 95.9 96.1 97.1 95.4 96.4  MCH 30.5 30.0 29.7 29.5 29.6  MCHC 31.8 31.2 30.6 30.9 30.7  RDW 16.7* 16.9* 17.1* 17.2* 17.2*  LYMPHSABS  --   --  1.1  --  1.0  MONOABS  --   --  1.6*  --  1.7*  EOSABS  --   --  0.1  --  0.2  BASOSABS  --   --  0.0  --  0.0    Recent Labs  Lab 11/26/19 0120 11/26/19 0450 11/26/19 1914 11/27/19 0058 11/27/19 0058 11/28/19 0257 11/28/19 0257 11/29/19 7829 11/30/19 0414 12/01/19 0453 12/01/19 1611 12/02/19 0408  NA 133*   < >  --  135   < > 133*  --  134* 133* 136  --  135  K 4.7   < >  --  3.2*   < > 3.2*   < > 4.4 3.2* 2.9* 3.0* 2.8*  CL 81*   < >  --  84*   < > 83*  --  83* 85* 89*  --  90*  CO2 42*   < >  --  35*   < > 32  --  35* 34* 34*  --  30  GLUCOSE 149*   < >  --  174*   < > 99  --  65* 68* 132*  --  165*  BUN 53*   < >  --  53*   < > 53*  --  55* 55* 47*  --  43*  CREATININE 3.15*   < >  --  2.53*   < > 2.52*  --  2.83* 2.70* 2.68*  --  2.85*  CALCIUM 8.7*   < >  --  8.6*   < > 8.9  --  9.0 8.7* 8.4*  --  8.3*  AST  --   --   --  20   < > 23  --  37 34 28  --  25  ALT  --   --   --  12   < > 14  --  20 19 18   --  18  ALKPHOS  --   --   --  110   < >  117  --  120 127* 133*  --  135*  BILITOT  --   --   --  1.7*   < > 1.3*  --  1.5* 1.5* 1.6*  --  1.5*  ALBUMIN  --   --   --  2.9*   < > 3.0*  --  2.9* 2.7* 2.7*  --  2.9*  MG  --   --   --  2.6*   < > 2.7*  --  3.1* 3.1* 2.9*  --  2.6*  INR 2.0*  --   --   --   --   --   --   --   --   --   --   --   HGBA1C  --   --  7.2*  --   --   --   --   --   --   --   --   --   BNP  --    < >  --  1,036.2*  --  1,167.5*  --  872.9* 911.8*  --   --  1,216.3*   < > = values in this interval not displayed.    Recent Labs  Lab 11/26/19 0450 11/26/19 0730 11/27/19 0058 11/28/19 0257 11/29/19 0718 11/30/19 0414 12/02/19 0408  BNP   < >  --  1,036.2* 1,167.5* 872.9* 911.8* 1,216.3*  SARSCOV2NAA  --  NEGATIVE  --   --   --   --   --    < > = values in this interval not displayed.    ------------------------------------------------------------------------------------------------------------------ No results for input(s): CHOL, HDL, LDLCALC, TRIG, CHOLHDL, LDLDIRECT in the last 72 hours.  Lab Results  Component Value Date   HGBA1C 7.2 (H) 11/26/2019   ------------------------------------------------------------------------------------------------------------------ No results for input(s): TSH, T4TOTAL, T3FREE, THYROIDAB in the last 72 hours.  Invalid input(s): FREET3 ------------------------------------------------------------------------------------------------------------------ No results for input(s): VITAMINB12, FOLATE, FERRITIN, TIBC, IRON, RETICCTPCT in the last 72 hours.  Coagulation profile Recent Labs  Lab 11/26/19 0120  INR 2.0*    No results for input(s): DDIMER in the last 72 hours.  Cardiac Enzymes No results for input(s): CKMB, TROPONINI, MYOGLOBIN in the last 168 hours.  Invalid input(s): CK ------------------------------------------------------------------------------------------------------------------    Component Value Date/Time   BNP 1,216.3 (H) 12/02/2019 0408     Micro Results Recent Results (from the past 240 hour(s))  SARS CORONAVIRUS 2 (TAT 6-24 HRS) Nasopharyngeal Nasopharyngeal Swab     Status: None   Collection Time: 11/26/19  7:30 AM   Specimen: Nasopharyngeal Swab  Result Value Ref Range Status   SARS Coronavirus 2 NEGATIVE NEGATIVE Final    Comment: (NOTE) SARS-CoV-2 target nucleic acids are NOT DETECTED. The SARS-CoV-2 RNA is generally detectable in upper and lower respiratory specimens during the acute phase of infection. Negative results do not preclude SARS-CoV-2 infection, do not rule out co-infections with other pathogens, and should not be used as the sole basis for treatment or other patient management decisions. Negative results must be combined with clinical observations, patient history, and epidemiological information. The expected result is Negative. Fact Sheet for Patients: SugarRoll.be Fact Sheet for Healthcare Providers: https://www.woods-mathews.com/ This test is not yet approved or cleared by the [place] FDA and  has been authorized for detection and/or diagnosis of SARS-CoV-2 by FDA under an Emergency Use Authorization (EUA). This EUA will remain  in effect (meaning this test can be used) for the duration of the COVID-19 declaration under Section 56 4(b)(1) of the Act, 21 U.S.C. section 360bbb-3(b)(1), unless the authorization is terminated or revoked sooner. Performed at [facility], [place] [address], [place], [place] [address]     Radiology Reports DG Chest 1 View  Result Date: 11/30/2019 CLINICAL DATA:  Pulmonary artery catheter insertion. EXAM: CHEST  1 VIEW COMPARISON:  Radiograph 11/26/2019 FINDINGS: Right internal jugular Swan-Ganz catheter tip in the region of the right pulmonary outflow tract. Post median sternotomy and TAVR. Left-sided pacemaker remains in place. Cardiomegaly with equivocal increase from prior exam. Worsening hazy opacity at the  lung bases consistent with increasing pleural effusions. Developing interstitial opacities/pulmonary edema. No evidence of pneumothorax. IMPRESSION: 1. Right internal jugular Swan-Ganz catheter tip in the region of the right pulmonary outflow tract. 2. Cardiomegaly with worsening pleural effusions and developing pulmonary edema. Electronically Signed   By: Keith Rake M.D.   On: 11/30/2019 16:09   DG Chest 2 View  Result Date: 11/26/2019 CLINICAL DATA:  Chest pain and shortness of breath EXAM: CHEST - 2 VIEW COMPARISON:  08/25/2019 FINDINGS: Small pleural effusions. Moderate cardiomegaly. Aortic valve replacement. Unchanged position of left chest wall AICD single lead. No focal airspace consolidation or pulmonary edema. IMPRESSION: Cardiomegaly and small bilateral pleural effusions. Electronically Signed   By: Ulyses Jarred M.D.   On: 11/26/2019 01:58   ECHOCARDIOGRAM COMPLETE  Result Date: 11/26/2019    ECHOCARDIOGRAM REPORT   Patient Name:   JUDDSON COBERN Date of Exam: 11/26/2019 Medical Rec #:  [id number]       Height:       70.0 in Accession #:    [id number]      Weight:       275.6 lb  Date of Birth:  [birth year]       BSA:          2.392 m Patient Age:    21 years        BP:           107/75 mmHg Patient Gender: M               HR:           113 bpm. Exam Location:  Inpatient Procedure: 2D Echo and Intracardiac Opacification Agent Indications:    CHF-Acute Systolic 630.16 / W10.93  History:        Patient has prior history of Echocardiogram examinations, most                 recent 03/04/2019. CAD and Previous Myocardial Infarction, Prior                 CABG and Defibrillator, Arrythmias:Atrial Fibrillation; Risk                 Factors:Dyslipidemia, Diabetes and Sleep Apnea. Ischemic                 cardiomyopathy.                 Aortic Valve: 29 mm Edwards Sapien prosthetic, stented (TAVR)                 valve is present in the aortic position. Procedure Date:                 01/04/2018.   Sonographer:    Darlina Sicilian RDCS Referring Phys: [id number] [place]  1. Left ventricular ejection fraction, by estimation, is <20%. The left ventricle has severely decreased function. The left ventricle demonstrates global hypokinesis. The left ventricular internal cavity size was moderately to severely dilated. Left ventricular diastolic function could not be evaluated.  2. Right ventricular systolic function is moderately reduced. The right ventricular size is moderately enlarged. There is moderately elevated pulmonary artery systolic pressure.  3. Left atrial size was moderately dilated.  4. The mitral valve is normal in structure. Mild mitral valve regurgitation. No evidence of mitral stenosis.  5. The aortic valve has been repaired/replaced. Aortic valve regurgitation is not visualized. There is a 29 mm Edwards Sapien prosthetic (TAVR) valve present in the aortic position. Procedure Date: 01/04/2018.  6. Mild pulmonic stenosis.  7. The inferior vena cava is dilated in size with <50% respiratory variability, suggesting right atrial pressure of 15 mmHg. Comparison(s): No significant change from prior study. FINDINGS  Left Ventricle: Left ventricular ejection fraction, by estimation, is <20%. The left ventricle has severely decreased function. The left ventricle demonstrates global hypokinesis. Definity contrast agent was given IV to delineate the left ventricular endocardial borders. The left ventricular internal cavity size was moderately to severely dilated. There is no left ventricular hypertrophy. Left ventricular diastolic function could not be evaluated. Right Ventricle: The right ventricular size is moderately enlarged. No increase in right ventricular wall thickness. Right ventricular systolic function is moderately reduced. There is moderately elevated pulmonary artery systolic pressure. The tricuspid  regurgitant velocity is 3.02 m/s, and with an assumed right atrial pressure of 15  mmHg, the estimated right ventricular systolic pressure is 20.2 mmHg. Left Atrium: Left atrial size was moderately dilated. Right Atrium: Right atrial size was not well visualized. Pericardium: There is no evidence of pericardial effusion. Mitral Valve: The mitral valve is normal in structure. Mild mitral  annular calcification. Mild mitral valve regurgitation. No evidence of mitral valve stenosis. Tricuspid Valve: The tricuspid valve is normal in structure. Tricuspid valve regurgitation is mild . No evidence of tricuspid stenosis. Aortic Valve: The aortic valve has been repaired/replaced. Aortic valve regurgitation is not visualized. Aortic valve mean gradient measures 4.0 mmHg. Aortic valve peak gradient measures 7.7 mmHg. Aortic valve area, by VTI measures 3.69 cm. There is a 29 mm Edwards Sapien prosthetic, stented (TAVR) valve present in the aortic position. Procedure Date: 01/04/2018. Pulmonic Valve: The pulmonic valve was grossly normal. Pulmonic valve regurgitation is mild to moderate. Mild pulmonic stenosis. Aorta: The aortic root and ascending aorta are structurally normal, with no evidence of dilitation. Venous: The inferior vena cava is dilated in size with less than 50% respiratory variability, suggesting right atrial pressure of 15 mmHg. IAS/Shunts: No atrial level shunt detected by color flow Doppler. Additional Comments: A pacer wire is visualized in the right ventricle and right atrium.  LEFT VENTRICLE PLAX 2D LVIDd:         6.29 cm LVIDs:         5.56 cm LV PW:         1.02 cm LV IVS:        1.05 cm LVOT diam:     2.70 cm LV SV:         85 LV SV Index:   36 LVOT Area:     5.73 cm  LV Volumes (MOD) LV vol d, MOD A2C: 341.0 ml LV vol d, MOD A4C: 256.0 ml LV vol s, MOD A2C: 270.0 ml LV vol s, MOD A4C: 206.0 ml LV SV MOD A2C:     71.0 ml LV SV MOD A4C:     256.0 ml LV SV MOD BP:      62.7 ml RIGHT VENTRICLE TAPSE (M-mode): 1.5 cm LEFT ATRIUM              Index LA diam:        5.50 cm  2.30 cm/m LA  Vol (A2C):   110.0 ml 45.99 ml/m LA Vol (A4C):   135.0 ml 56.44 ml/m LA Biplane Vol: 127.0 ml 53.09 ml/m  AORTIC VALVE AV Area (Vmax):    4.34 cm AV Area (Vmean):   4.36 cm AV Area (VTI):     3.69 cm AV Vmax:           138.50 cm/s AV Vmean:          94.050 cm/s AV VTI:            0.231 m AV Peak Grad:      7.7 mmHg AV Mean Grad:      4.0 mmHg LVOT Vmax:         105.00 cm/s LVOT Vmean:        71.600 cm/s LVOT VTI:          0.149 m LVOT/AV VTI ratio: 0.65  AORTA Ao Root diam: 3.20 cm MITRAL VALVE               TRICUSPID VALVE MV Area (PHT): 6.43 cm    TR Peak grad:   36.5 mmHg MV Decel Time: 118 msec    TR Vmax:        302.00 cm/s MV E velocity: 99.15 cm/s                            SHUNTS  Systemic VTI:  0.15 m                            Systemic Diam: 2.70 cm Buford Dresser MD Electronically signed by Buford Dresser MD Signature Date/Time: 11/26/2019/4:53:33 PM    Final     Time Spent in minutes  30   Lala Lund M.D on 12/02/2019 at 9:57 AM  To page go to [email or web address] - password [email or web address]

## 2019-12-02 NOTE — Progress Notes (Signed)
Physical Therapy Treatment Patient Details Name: [name] [name] MRN: [id number] DOB: Sep 28, 1950 Today's Date: 12/02/2019    History of Present Illness 69 yo admitted to ED with CHF exacerbation and SOB. Pt noted to have bil LE blistering with drainage from knee to ankle. PMH includes CHF, CAD s/p CABG, aortic stenosis, s/p TAVR, Afib, HTN, obese, HLD, CKD, DM2, AICD.    PT Comments    Patient progressing slowly.  Kept oxygen for ambulation and pt tolerated except for tape sticking in his thighs with lines.  Seems capable to mobilize in the home and wife reports only one step to enter.  She states they have a walker at home.  Would rely on Hospice for further equipment needs.  PT to follow until d/c.    Follow Up Recommendations  Supervision/Assistance - 24 hour(home with Hospice)     Equipment Recommendations  None recommended by PT    Recommendations for Other Services       Precautions / Restrictions Precautions Precautions: Fall    Mobility  Bed Mobility               General bed mobility comments: in recliner  Transfers Overall transfer level: Needs assistance Equipment used: Rolling walker (2 wheeled) Transfers: Sit to/from Stand Sit to Stand: Min guard         General transfer comment: cues to push up on chair armrests, assist for multiple lines  Ambulation/Gait Ambulation/Gait assistance: Supervision;+2 safety/equipment Gait Distance (Feet): 100 Feet Assistive device: Rolling walker (2 wheeled) Gait Pattern/deviations: Step-through pattern;Decreased stride length     General Gait Details: assist for safety, multiple lines, wife pushing one IV pole   Stairs             Wheelchair Mobility    Modified Rankin (Stroke Patients Only)       Balance Overall balance assessment: Needs assistance Sitting-balance support: Feet supported Sitting balance-Leahy Scale: Good     Standing balance support: No upper extremity supported;During  functional activity Standing balance-Leahy Scale: Poor Standing balance comment: standing holding wife's hands when I entered the room, able to let go but wide BOS and needing supervision at least                            Cognition Arousal/Alertness: Awake/alert Behavior During Therapy: WFL for tasks assessed/performed Overall Cognitive Status: Within Functional Limits for tasks assessed                                        Exercises      General Comments        Pertinent Vitals/Pain Pain Assessment: Faces Faces Pain Scale: Hurts little more Pain Location: thighs Pain Descriptors / Indicators: Stabbing(tape sticking him) Pain Intervention(s): Monitored during session;Repositioned    Home Living                      Prior Function            PT Goals (current goals can now be found in the care plan section) Progress towards PT goals: Progressing toward goals    Frequency           PT Plan Current plan remains appropriate    Co-evaluation              AM-PAC PT "6 Clicks" Mobility  Outcome Measure  Help needed turning from your back to your side while in a flat bed without using bedrails?: A Little Help needed moving from lying on your back to sitting on the side of a flat bed without using bedrails?: A Little Help needed moving to and from a bed to a chair (including a wheelchair)?: A Little Help needed standing up from a chair using your arms (e.g., wheelchair or bedside chair)?: A Little Help needed to walk in hospital room?: A Little Help needed climbing 3-5 steps with a railing? : A Little 6 Click Score: 18    End of Session Equipment Utilized During Treatment: Oxygen Activity Tolerance: Patient limited by fatigue Patient left: with call bell/phone within reach;in bed;with family/visitor present(seated EOB)   PT Visit Diagnosis: Other abnormalities of gait and mobility (R26.89);Muscle weakness  (generalized) (M62.81)     Time: 1700-1749 PT Time Calculation (min) (ACUTE ONLY): 19 min  Charges:  $Gait Training: 8-22 mins                     Magda Kiel, [place] [phone] 12/02/2019    Reginia Naas 12/02/2019, 1:12 PM

## 2019-12-02 NOTE — TOC Initial Note (Signed)
Transition of Care [facility]) - Initial/Assessment Note    Patient Details  Name: [name] [name] MRN: [id number] Date of Birth: 1950-11-11  [facility] CM/SW Contact:    Sharin Mons, RN Phone Number: [phone] 12/02/2019, 3:08 PM  Clinical Narrative:      Admitted with CHF exacerbation and SOB, hx of CHF, CAD s/p CABG, aortic stenosis, s/p TAVR, Afib, HTN, obese, HLD, CKD, DM2, AICD. From home with wife.   NCM received consult: Home with hospice. Live in [place]. Requesting [facility]. NCM spoke with pt and wife @ bedside. Both confirmed d/c plan, home with hospice care. Requested referral to made with [facility].  NCM called and referral made with [facility]  DME ( hospital bed, oxygen, BSC) needs to be delivered into pt's home prior to d/c. Information shared with [facility] liaison. TOC team will continue to monitor for needs .Marland Kitchen...   Expected Discharge Plan: Home w Hospice Care Barriers to Discharge: Continued Medical Work up   Patient Goals and CMS Choice Patient states their goals for this hospitalization and ongoing recovery are:: i want to go home   Choice offered to / list presented to : Patient, Spouse  Expected Discharge Plan and Services Expected Discharge Plan: Home w Hospice Care In-house Referral: NA Discharge Planning Services: CM Consult   Living arrangements for the past 2 months: Single Family Home                 DME Arranged: N/A                    Prior Living Arrangements/Services Living arrangements for the past 2 months: Single Family Home Lives with:: Spouse Patient language and need for interpreter reviewed:: Yes Do you feel safe going back to the place where you live?: Yes      Need for Family Participation in Patient Care: Yes (Comment) Care giver support system in place?: Yes (comment) Current home services: DME(Patient has cane, RW and 2 crutches.) Criminal Activity/Legal  Involvement Pertinent to Current Situation/Hospitalization: No - Comment as needed  Activities of Daily Living Home Assistive Devices/Equipment: Walker (specify type) ADL Screening (condition at time of admission) Patient's cognitive ability adequate to safely complete daily activities?: Yes Is the patient deaf or have difficulty hearing?: No Does the patient have difficulty seeing, even when wearing glasses/contacts?: No Does the patient have difficulty concentrating, remembering, or making decisions?: No Patient able to express need for assistance with ADLs?: Yes Does the patient have difficulty dressing or bathing?: No Independently performs ADLs?: Yes (appropriate for developmental age) Does the patient have difficulty walking or climbing stairs?: Yes Weakness of Legs: Both Weakness of Arms/Hands: None  Permission Sought/Granted Permission sought to share information with : Family Supports, Chartered certified accountant granted to share information with : Yes, Verbal Permission Granted              Emotional Assessment Appearance:: Appears stated age Attitude/Demeanor/Rapport: Unable to Assess Affect (typically observed): Anxious Orientation: : Oriented to Self, Oriented to Place, Oriented to  Time, Oriented to Situation Alcohol / Substance Use: Not Applicable Psych Involvement: No (comment)  Admission diagnosis:  Unstable angina (HCC) [I20.0] Acute exacerbation of CHF (congestive heart failure) (HCC) [I50.9] Nonspecific chest pain [R07.9] Patient Active Problem List   Diagnosis Date Noted  . Acute exacerbation of CHF (congestive heart failure) ([place]) 11/26/2019  . History of gastric ulcer 10/10/2019  . Colon cancer screening 10/10/2019  .  Esophagitis 10/10/2019  . Dehydration   . Ventricular tachycardia ([place]) 08/25/2019  . VT (ventricular tachycardia) ([place]) 08/25/2019  . IDA (iron deficiency anemia) 11/13/2018  . CKD stage 2 due to type 2 diabetes  mellitus ([place])   . Acute gastric ulcer with hemorrhage   . Acute upper GI bleed 10/02/2018  . GI bleed 10/01/2018  . Acute blood loss anemia   . PAF (paroxysmal atrial fibrillation) ([place]) 09/29/2018  . History of transcatheter aortic valve replacement (TAVR) 11/20/2017  . Aortic stenosis   . S/P nasal septoplasty 04/25/2017  . AICD (automatic cardioverter/defibrillator) present 11/22/2016  . CKD (chronic kidney disease) stage 3, GFR 30-59 ml/min   . Acute kidney injury ([place])   . Difficulty in walking, not elsewhere classified   . Pain in the abdomen   . Anasarca   . Constipation   . Dyspnea 09/11/2016  . Prolonged Q-T interval on ECG 09/11/2016  . Hypokalemia 09/11/2016  . Chest pain 10/22/2015  . Type 2 diabetes with nephropathy ([place]) 10/22/2015  . Essential hypertension   . Chronic lumbar pain 01/01/2015  . Obstructive sleep apnea 10/02/2014  . Cardiomyopathy, ischemic 06/25/2014  . Chronic combined systolic and diastolic CHF (congestive heart failure) ([place]) 10/29/2013  . Right ankle pain 08/14/2013  . Hyperlipemia 05/14/2013  . CAD (coronary artery disease) 05/14/2013  . Class 2 severe obesity due to excess calories with serious comorbidity and body mass index (BMI) of 39.0 to 39.9 in adult ([place]) 05/14/2013  . Elevated PSA 05/14/2013  . S/P CABG x 6 12/23/1999   PCP:  Kathyrn Drown, MD Pharmacy:   [place], [name] [address] Phone: [phone] Fax: [phone]  [place], [address] [place] [address] Phone: [phone] Fax: [phone]     Social Determinants of Health (SDOH) Interventions    Readmission Risk Interventions Readmission Risk Prevention Plan 11/27/2019  Transportation Screening Complete  PCP or Specialist Appt within 3-5 Days Complete  HRI or [place] Complete  Social Work Consult for [place]  Planning/Counseling Complete  Palliative Care Screening Not Applicable  Medication Review Press photographer) Complete  Some recent data might be hidden

## 2019-12-03 LAB — COMPREHENSIVE METABOLIC PANEL
ALT: 17 U/L (ref 0–44)
AST: 27 U/L (ref 15–41)
Albumin: 2.9 g/dL — ABNORMAL LOW (ref 3.5–5.0)
Alkaline Phosphatase: 141 U/L — ABNORMAL HIGH (ref 38–126)
Anion gap: 13 (ref 5–15)
BUN: 43 mg/dL — ABNORMAL HIGH (ref 8–23)
CO2: 27 mmol/L (ref 22–32)
Calcium: 8.9 mg/dL (ref 8.9–10.3)
Chloride: 92 mmol/L — ABNORMAL LOW (ref 98–111)
Creatinine, Ser: 3.15 mg/dL — ABNORMAL HIGH (ref 0.61–1.24)
GFR calc Af Amer: 22 mL/min — ABNORMAL LOW (ref >60)
GFR calc non Af Amer: 19 mL/min — ABNORMAL LOW (ref >60)
Glucose, Bld: 140 mg/dL — ABNORMAL HIGH (ref 70–99)
Potassium: 4.6 mmol/L (ref 3.5–5.1)
Sodium: 132 mmol/L — ABNORMAL LOW (ref 135–145)
Total Bilirubin: 1.6 mg/dL — ABNORMAL HIGH (ref 0.3–1.2)
Total Protein: 7.3 g/dL (ref 6.5–8.1)

## 2019-12-03 LAB — CBC WITH DIFFERENTIAL/PLATELET
Abs Immature Granulocytes: 0.06 10*3/uL (ref 0.00–0.07)
Basophils Absolute: 0.1 10*3/uL (ref 0.0–0.1)
Basophils Relative: 1 %
Eosinophils Absolute: 0.2 10*3/uL (ref 0.0–0.5)
Eosinophils Relative: 2 %
HCT: 42.6 % (ref 39.0–52.0)
Hemoglobin: 13.1 g/dL (ref 13.0–17.0)
Immature Granulocytes: 1 %
Lymphocytes Relative: 8 %
Lymphs Abs: 0.9 10*3/uL (ref 0.7–4.0)
MCH: 29.6 pg (ref 26.0–34.0)
MCHC: 30.8 g/dL (ref 30.0–36.0)
MCV: 96.4 fL (ref 80.0–100.0)
Monocytes Absolute: 2.2 10*3/uL — ABNORMAL HIGH (ref 0.1–1.0)
Monocytes Relative: 19 %
Neutro Abs: 8.3 10*3/uL — ABNORMAL HIGH (ref 1.7–7.7)
Neutrophils Relative %: 69 %
Platelets: 326 10*3/uL (ref 150–400)
RBC: 4.42 MIL/uL (ref 4.22–5.81)
RDW: 17.1 % — ABNORMAL HIGH (ref 11.5–15.5)
WBC: 11.8 10*3/uL — ABNORMAL HIGH (ref 4.0–10.5)
nRBC: 0 % (ref 0.0–0.2)

## 2019-12-03 LAB — COOXEMETRY PANEL
Carboxyhemoglobin: 1 % (ref 0.5–1.5)
Methemoglobin: 0.9 % (ref 0.0–1.5)
O2 Saturation: 17.2 %
Total hemoglobin: 14.2 g/dL (ref 12.0–16.0)

## 2019-12-03 LAB — HEPARIN LEVEL (UNFRACTIONATED): Heparin Unfractionated: >2.2 IU/mL — ABNORMAL HIGH (ref 0.30–0.70)

## 2019-12-03 LAB — MAGNESIUM: Magnesium: 2.9 mg/dL — ABNORMAL HIGH (ref 1.7–2.4)

## 2019-12-03 LAB — GLUCOSE, CAPILLARY
Glucose-Capillary: 125 mg/dL — ABNORMAL HIGH (ref 70–99)
Glucose-Capillary: 163 mg/dL — ABNORMAL HIGH (ref 70–99)

## 2019-12-03 LAB — BRAIN NATRIURETIC PEPTIDE: B Natriuretic Peptide: 1365.6 pg/mL — ABNORMAL HIGH (ref 0.0–100.0)

## 2019-12-03 MED ORDER — AMIODARONE HCL 400 MG PO TABS: 400.0000 mg | ORAL_TABLET | Freq: Two times a day (BID) | ORAL | 1 refills | Status: AC

## 2019-12-03 MED ORDER — INSULIN DETEMIR 100 UNIT/ML ~~LOC~~ SOLN: 20.0000 [IU] | Freq: Every day | SUBCUTANEOUS | 11 refills | Status: AC

## 2019-12-03 MED ORDER — ALPRAZOLAM 0.25 MG PO TABS: 0.2500 mg | ORAL_TABLET | Freq: Three times a day (TID) | ORAL | 0 refills | Status: AC | PRN

## 2019-12-03 MED ORDER — SENNOSIDES-DOCUSATE SODIUM 8.6-50 MG PO TABS: 1.0000 | ORAL_TABLET | Freq: Every evening | ORAL | Status: AC | PRN

## 2019-12-03 MED ORDER — PANTOPRAZOLE SODIUM 40 MG PO TBEC: 40.0000 mg | DELAYED_RELEASE_TABLET | Freq: Every day | ORAL | 1 refills | Status: AC

## 2019-12-03 MED ORDER — OXYCODONE HCL 5 MG PO TABS: 5.0000 mg | ORAL_TABLET | ORAL | 0 refills | Status: AC | PRN

## 2019-12-03 MED ORDER — ASPIRIN 81 MG PO TBEC: 81.0000 mg | DELAYED_RELEASE_TABLET | Freq: Every day | ORAL | 1 refills | Status: AC

## 2019-12-03 MED ORDER — POLYETHYLENE GLYCOL 3350 17 G PO PACK: 17.0000 g | PACK | Freq: Every day | ORAL | 0 refills | Status: AC | PRN

## 2019-12-03 NOTE — Progress Notes (Signed)
Daily Progress Note   Patient Name: [name] [name]       Date: 12/03/2019 DOB: 1951/04/16  Age: 69 y.o. MRN#: [id number] Attending Physician: Hosie Poisson, MD Primary Care Physician: Kathyrn Drown, MD Admit Date: 11/26/2019  Reason for Consultation/Follow-up: Establishing goals of care and Terminal Care  Subjective/GOC: Patient awake, alert, oriented. Sitting up in recliner. Took prn xanax and oxycodone last night and states some relief of anxiety and dyspnea but was awake frequently from telemetry alarms. Patient is eager to get home as soon as possible this afternoon. He does appear more comfortable compared to assessment yesterday.  Wife at bedside. Discussed plan of care and discharge home with hospice this afternoon. Equipment will be delivered to their house between 9-11am. Wife plans to call hospice staff when they arrive home for same day admit. Answered questions about plan of care. Recommended continued prn oxycodone and xanax on discharge and hospice will further manage medication changes when needed. Wife has PMT contact information and understands she can call me with questions or concerns this afternoon.   Length of Stay: 7  Current Medications: Scheduled Meds:  . amiodarone  400 mg Oral BID  . aspirin EC  81 mg Oral Daily  . Chlorhexidine Gluconate Cloth  6 each Topical Daily  . Chlorhexidine Gluconate Cloth  6 each Topical Daily  . insulin aspart  0-9 Units Subcutaneous TID WC  . insulin detemir  20 Units Subcutaneous QHS  . pantoprazole  40 mg Oral Daily  . potassium chloride  40 mEq Oral TID  . rivaroxaban  15 mg Oral Q supper  . rosuvastatin  40 mg Oral Daily  . sodium chloride flush  10-40 mL Intracatheter Q12H  . tamsulosin  0.4 mg Oral Daily    Continuous  Infusions: . furosemide Stopped (12/02/19 1831)  . milrinone Stopped (12/02/19 1549)    PRN Meds: acetaminophen, ALPRAZolam, alum & mag hydroxide-simeth, nitroGLYCERIN, oxyCODONE, polyethylene glycol, senna-docusate, sodium chloride, sodium chloride flush  Physical Exam Vitals and nursing note reviewed.  Constitutional:      General: He is awake.  Cardiovascular:     Rate and Rhythm: Rhythm irregularly irregular.  Pulmonary:     Effort: No tachypnea, accessory muscle usage or respiratory distress.  Skin:    General: Skin is warm and dry.  Neurological:  Mental Status: He is alert and oriented to person, place, and time.  Psychiatric:        Attention and Perception: Attention normal.        Speech: Speech normal.        Cognition and Memory: Cognition normal.            Vital Signs: BP 102/76 (BP Location: Right Arm)   Pulse 100   Temp 98.2 F (36.8 C) (Oral)   Resp 19   Ht 5\' 10"  (1.778 m)   Wt 119.9 kg   SpO2 95%   BMI 37.93 kg/m  SpO2: SpO2: 95 % O2 Device: O2 Device: Room Air O2 Flow Rate: O2 Flow Rate (L/min): 2 L/min  Intake/output summary:   Intake/Output Summary (Last 24 hours) at 12/03/2019 2409 Last data filed at 12/03/2019 0435 Gross per 24 hour  Intake 1135.38 ml  Output 700 ml  Net 435.38 ml   LBM: Last BM Date: 12/02/19 Baseline Weight: Weight: 125 kg Most recent weight: Weight: 119.9 kg       Palliative Assessment/Data: PPS 40%    Flowsheet Rows     Most Recent Value  Intake Tab  Referral Department  Hospitalist  Unit at Time of Referral  ICU  Palliative Care Primary Diagnosis  Cardiac  Palliative Care Type  New Palliative care  Reason for referral  Clarify Goals of Care  Date first seen by Palliative Care  12/02/19  Clinical Assessment  Palliative Performance Scale Score  40%  Psychosocial & Spiritual Assessment  Palliative Care Outcomes  Patient/Family meeting held?  Yes  Who was at the meeting?  patient and wife  Palliative  Care Outcomes  Clarified goals of care, Counseled regarding hospice, Provided end of life care assistance, Improved pain interventions, Improved non-pain symptom therapy, Provided psychosocial or spiritual support, Transitioned to hospice      Patient Active Problem List   Diagnosis Date Noted  . Palliative care by specialist   . Anxiety state   . Cardiogenic shock ([place])   . Acute exacerbation of CHF (congestive heart failure) ([place]) 11/26/2019  . History of gastric ulcer 10/10/2019  . Goals of care, counseling/discussion 10/10/2019  . Esophagitis 10/10/2019  . Dehydration   . Ventricular tachycardia ([place]) 08/25/2019  . VT (ventricular tachycardia) ([place]) 08/25/2019  . IDA (iron deficiency anemia) 11/13/2018  . CKD stage 2 due to type 2 diabetes mellitus ([place])   . Acute gastric ulcer with hemorrhage   . Acute upper GI bleed 10/02/2018  . GI bleed 10/01/2018  . Acute blood loss anemia   . PAF (paroxysmal atrial fibrillation) ([place]) 09/29/2018  . History of transcatheter aortic valve replacement (TAVR) 11/20/2017  . Aortic stenosis   . S/P nasal septoplasty 04/25/2017  . AICD (automatic cardioverter/defibrillator) present 11/22/2016  . CKD (chronic kidney disease) stage 3, GFR 30-59 ml/min   . Acute kidney injury ([place])   . Difficulty in walking, not elsewhere classified   . Pain in the abdomen   . Anasarca   . Constipation   . Dyspnea 09/11/2016  . Prolonged Q-T interval on ECG 09/11/2016  . Hypokalemia 09/11/2016  . Chest pain 10/22/2015  . Type 2 diabetes with nephropathy ([place]) 10/22/2015  . Essential hypertension   . Chronic lumbar pain 01/01/2015  . Obstructive sleep apnea 10/02/2014  . Cardiomyopathy, ischemic 06/25/2014  . Chronic combined systolic and diastolic CHF (congestive heart failure) ([place]) 10/29/2013  . Right ankle pain 08/14/2013  . Hyperlipemia 05/14/2013  . CAD (coronary  artery disease) 05/14/2013  . Class 2 severe obesity due to excess calories with serious  comorbidity and body mass index (BMI) of 39.0 to 39.9 in adult ([place]) 05/14/2013  . Elevated PSA 05/14/2013  . S/P CABG x 6 12/23/1999    Palliative Care Assessment & Plan   Patient Profile: 69 y.o. male  with past medical history of systolic heart failure due to ischemic cardiomyopathy EF 15%, CAD s/p CABG x6 in 2001, aortic stenosis s/p TAVR 2019, paroxysmal atrial fibrillation, ICD, OSA not using CPAP, hypertension, hyperlipidemia, CKD, obesity, Type 2 DM admitted on 11/26/2019 with chest pain and progressive lower extremity edema. Patient with cardiogenic shock being following by heart failure team. Receiving IV lasix and on milrinone gtt. Worsening kidney function. Co-ox 53% with CVP 20. Palliative medicine consultation for goals of care/terminal care. Patient has spoken of wishes to stop aggressive therapies and transition to comfort/home hospice.   Assessment: Biventricular heart failure  Ischemic cardiomyopathy EF 15% Cardiogenic shock Acute on CKD PAF AS s/p TAVR Hx of CABG  Recommendations/Plan:  Home with [facility] hospice agency today. Supportive family who can provide 24/7 care.  Durable DNR in chart.  Recommend continuing prn xanax and oxycodone on discharge. Hospice provider will further manage symptom management medications.  RN to deactivate ICD prior to discharge.  Code Status: DNR   Code Status Orders  (From admission, onward)         Start     Ordered   12/01/19 2208  Do not attempt resuscitation (DNR)  Continuous    Question Answer Comment  In the event of cardiac or respiratory ARREST Do not call a "code blue"   In the event of cardiac or respiratory ARREST Do not perform Intubation, CPR, defibrillation or ACLS   In the event of cardiac or respiratory ARREST Use medication by any route, position, wound care, and other measures to relive pain and suffering. May use oxygen, suction and manual treatment of airway obstruction as needed for comfort.       12/01/19 2208        Code Status History    Date Active Date Inactive Code Status Order ID Comments User Context   11/26/2019 0604 12/01/2019 2208 Full Code [id number]  Chauncey Mann, MD ED   08/26/2019 0501 08/28/2019 2055 Full Code [id number]  Shellia Cleverly, MD Inpatient   10/01/2018 1932 10/04/2018 1655 Full Code [id number]  Bethena Roys, MD Inpatient   11/20/2017 1013 11/21/2017 1906 Full Code [id number]  Rexene Alberts, MD Inpatient   10/05/2017 0935 10/05/2017 1653 Full Code [id number]  Sherren Mocha, MD Inpatient   04/25/2017 1849 04/26/2017 1643 Full Code [id number]  Leta Baptist, MD Inpatient   09/11/2016 2132 09/18/2016 2057 Partial Code [id number]  Karmen Bongo, MD Inpatient   05/18/2016 1144 05/19/2016 1349 Full Code [id number]  Constance Haw, MD Inpatient   10/22/2015 0833 10/24/2015 1728 Partial Code [id number]  Willia Craze, NP ED   10/22/2015 [phone] 10/22/2015 0833 Full Code [id number]  Willia Craze, NP ED   12/21/2014 0901 12/21/2014 1429 Full Code [id number]  Belva Crome, MD Inpatient   Advance Care Planning Activity       Prognosis:   Likely weeks with biventricular heart failure, cardiogenic shock, worsening renal function  Discharge Planning:  Home with Hospice  Care plan was discussed with patient, wife, RN  Thank you for allowing the Palliative Medicine Team to assist in the care of this patient.   Time In:  7331 Time Out: 0910 Total Time 15 Prolonged Time Billed no      Greater than 50%  of this time was spent counseling and coordinating care related to the above assessment and plan.  Ihor Dow, DNP, FNP-C Palliative Medicine Team  Phone: [phone] Fax: [phone]  Please contact Palliative Medicine Team phone at [phone] for questions and concerns.

## 2019-12-03 NOTE — Consult Note (Signed)
   [facility] CM Inpatient Consult   12/03/2019  NISHANTH MCCAUGHAN 08/21/1951 [id number]   Patient screened for length of stay and  high risk score list for unplanned readmission in the Medicare [place] [ACO] with [place] Management.  Patient had been contact with EMMI follow up with a [place] Coordinator earlier this year.   A brief review  of patient's medical record reveals patient is being transitioned to home with hospice care.   Plan:  Will sign off at transition to home, there are no [facility] Care Management needs noted as patient will receive complete care management under hospice care.   Please contact, for questions contact:   Natividad Brood, RN BSN [facility] Liaison  [phone] business mobile phone Toll free office [phone]  Fax number: [phone] [email or web address]

## 2019-12-03 NOTE — TOC Transition Note (Signed)
Transition of Care [facility]) - CM/SW Discharge Note   Patient Details  Name: [name] [name] MRN: [id number] Date of Birth: 05/02/1951  [facility]) CM/SW Contact:  Sharin Mons, RN Phone Number:[phone] 12/03/2019, 3:29 PM   Clinical Narrative:      Acute on chronic systolic HF -> cardiogenic shock/ End Stage Biventricular Failure. Patient will DC to: Home with hospice care, [name] Anticipated DC date: 12/03/2019 Family notified: Wife, Santiago Glad Transport by: Corey Harold   Per MD patient ready for DC today. Pt will transition to home with hospice care. RN, patient, and patient's family aware of DC plan. Discharge Summary enclosed in pt's packet with transportation forms. DC packet on chart. Ambulance transport requested for patient.   RNCM will sign off for now as intervention is no longer needed. Please consult [place] again if new needs arise.   Final next level of care: Home w Hospice Care Barriers to Discharge: No Barriers Identified   Patient Goals and CMS Choice Patient states their goals for this hospitalization and ongoing recovery are:: i want to go home   Choice offered to / list presented to : Patient, Spouse  Discharge Placement                       Discharge Plan and Services In-house Referral: NA Discharge Planning Services: CM Consult            DME Arranged: N/A           HH Agency: [place] Date [place]: 12/02/19 Time [place]: 1620 Representative spoke with at [place]: [place] ([place]) Interventions     Readmission Risk Interventions Readmission Risk Prevention Plan 11/27/2019  Transportation Screening Complete  PCP or Specialist Appt within 3-5 Days Complete  HRI or [place] Complete  Social Work Consult for [place] Planning/Counseling Complete  Palliative Care Screening Not Applicable  Medication Review Press photographer) Complete   Some recent data might be hidden

## 2019-12-03 NOTE — Discharge Summary (Signed)
Physician Discharge Summary  [name] [name] YPP:[id number] DOB: 1951/03/11 DOA: 11/26/2019  PCP: Kathyrn Drown, MD  Admit date: 11/26/2019 Discharge date: 12/03/2019  Admitted From: Home.  Disposition:  Home with hospice.    Recommendations for Outpatient Follow-up:  Follow up with hospice MD as needed.   Discharge Condition:hOSPICE.  CODE STATUS:DNR Diet recommendation: Heart Healthy   Brief/Interim Summary:  69 year old Caucasian male who was admitted few hours ago with symptoms of CHF with known past medical history of ischemic cardiomyopathy with chronic systolic heart failure EF of 15%, s/p CABG,aortic stenosis s/p TAVR, paroxysmal A. fib [place] vas 2 score of greater than 3, hypertension, dyslipidemia, morbid obesity, type 2 diabetes, CKD 3 who presented to the hospital with chief complaints of chest pain and shortness of breath along with leg edema.  Discharge Diagnoses:  Principal Problem:   Acute exacerbation of CHF (congestive heart failure) (HCC) Active Problems:   Hyperlipemia   Class 2 severe obesity due to excess calories with serious comorbidity and body mass index (BMI) of 39.0 to 39.9 in adult [facility])   Chronic combined systolic and diastolic CHF (congestive heart failure) (HCC)   Cardiomyopathy, ischemic   Obstructive sleep apnea   Type 2 diabetes with nephropathy (HCC)   Essential hypertension   Dyspnea   Prolonged Q-T interval on ECG   Constipation   Acute kidney injury (HCC)   CKD (chronic kidney disease) stage 3, GFR 30-59 ml/min   AICD (automatic cardioverter/defibrillator) present   S/P CABG x 6   History of transcatheter aortic valve replacement (TAVR)   PAF (paroxysmal atrial fibrillation) (HCC)   Goals of care, counseling/discussion   Palliative care by specialist   Anxiety state   Cardiogenic shock ([place])  1.  Acute on chronic systolic heart failure in a patient with EF less than 20% s/p TAVR for AS with reduced right-sided RV systolic function has  AICD.  Patient has combination of both left and right-sided heart failure (this admission appears to be predominantly right-sided heart failure in nature due to more peripheral edema than lung involvement) , has massive leg edema and relatively spared lungs, he was diuresed with IV pushes however he became hypotensive and thereafter was transferred to ICU under cardiology care, overall this appears to be terminal CHF with extremely poor and guarded prognosis.  He received right IJ Swan-Ganz catheter on 11/30/2019 and was started on IV Lasix drip along with milrinone drip, blood pressure too low to tolerate beta-blocker or any other blood pressure medication at this time, renal failure prevents the use of ACE/ARB or Entresto etc, although he diuresing but he is developing continued renal dysfunction, his SPO2 on COx remains around 53%, discussed with cardiology team Dr. Tempie Hoist and wife on 12/02/2019.  Palliative care consulted and family wanted to be discharged home with hospice.he will be discharged on oral lasix 60 mg BID, oral amiodarone and xarelto. Hospice MD to follow up.    2.  Hypokalemia.  Replaced   3.  Paroxysmal atrial fibrillation.  Mali vas 2 score of at least 4.  Was in RVR with hypotension morning of 11/27/2019, blood pressure too low to tolerate beta-blocker calcium channel blockers, and placed him on IV amiodarone drip for 2 days and now in good rate control, now has been switched to oral amiodarone and xarelto for discharge.   4.  AKI on CKD 4 - improving with diuresis monitor.  Creatinine is anywhere between 1.5 and 2.5.  Currently certainly has AKI in the  setting of hypotension and CHF. Poor prognosis.   5.  History of AS and TAVR procedure.  Supportive care.  6.  CAD with underlying ischemic cardiomyopathy s/p CABG.  No acute issues continue combination of aspirin and statin for secondary prevention.    7. OSA - ? Patient refused outpt Study, undiagnosed.    8.   Orthostatic hypotension.  Hold further diuretics,.   9. DM2 - Resume home meds as needed.     Discharge Instructions  Discharge Instructions    Diet - low sodium heart healthy   Complete by: As directed    Discharge instructions   Complete by: As directed    Follow up with hospice MD as recommended.     Allergies as of 12/03/2019      Reactions   Dyflex-g [dyphylline-guaifenesin] Hives   Ambien [zolpidem Tartrate] Other (See Comments)   Increased lethargy during daytime   Eliquis [apixaban] Other (See Comments)   Side effects not a true allergy- he stated that it made him feel bad      Medication List    STOP taking these medications   carvedilol 3.125 MG tablet Commonly known as: COREG   GoodSense Muscle Rub 8-30 % Crea   HYDROcodone-acetaminophen 10-325 MG tablet Commonly known as: NORCO   isosorbide dinitrate 30 MG tablet Commonly known as: ISORDIL   Levemir FlexTouch 100 UNIT/ML FlexPen Generic drug: insulin detemir Replaced by: insulin detemir 100 UNIT/ML injection   linaclotide 290 MCG Caps capsule Commonly known as: Linzess   metolazone 2.5 MG tablet Commonly known as: ZAROXOLYN   Sure Comfort Pen Needles 31G X 8 MM Misc Generic drug: Insulin Pen Needle   torsemide 20 MG tablet Commonly known as: DEMADEX   traMADol 50 MG tablet Commonly known as: ULTRAM   trolamine salicylate 10 % cream Commonly known as: ASPERCREME     TAKE these medications   acetaminophen 500 MG tablet Commonly known as: TYLENOL Take 1 tablet (500 mg total) by mouth 2 (two) times daily. What changed:   how much to take  when to take this  reasons to take this   ALPRAZolam 0.25 MG tablet Commonly known as: XANAX Take 1 tablet (0.25 mg total) by mouth 3 (three) times daily as needed for anxiety or sleep.   amiodarone 400 MG tablet Commonly known as: PACERONE Take 1 tablet (400 mg total) by mouth 2 (two) times daily.   aspirin 81 MG EC tablet Take 1 tablet (81  mg total) by mouth daily. Start taking on: December 04, 2019   blood glucose meter kit and supplies Kit Dispense based on patient and insurance preference. Test  up to four times daily as directed.Dx: E11.9   calcium carbonate 500 MG chewable tablet Commonly known as: TUMS - dosed in mg elemental calcium Chew 2 tablets by mouth 2 (two) times daily as needed for indigestion or heartburn.   CENTRUM SILVER PO Take 1 tablet by mouth daily.   FIBER PO Take 5 capsules by mouth daily.   insulin detemir 100 UNIT/ML injection Commonly known as: LEVEMIR Inject 0.2 mLs (20 Units total) into the skin at bedtime. Replaces: Levemir FlexTouch 100 UNIT/ML FlexPen   nitroGLYCERIN 0.4 MG SL tablet Commonly known as: NITROSTAT PLACE 1 TABLET UNDER TONGUE FOR CHEST PAIN. MAY REPEAT EVERY 5 MIN UP TO 3 DOSES-NO RELIEF,CALL 911. What changed: See the new instructions.   NovoLOG FlexPen 100 UNIT/ML FlexPen Generic drug: insulin aspart Inject 3 Units into the skin 3 (three)  times daily with meals. What changed: how much to take   oxyCODONE 5 MG immediate release tablet Commonly known as: Oxy IR/ROXICODONE Take 1 tablet (5 mg total) by mouth every 4 (four) hours as needed for moderate pain (dyspnea/air hunger/tachypnea).   pantoprazole 40 MG tablet Commonly known as: PROTONIX Take 1 tablet (40 mg total) by mouth daily. Start taking on: December 04, 2019   polyethylene glycol 17 g packet Commonly known as: MIRALAX / GLYCOLAX Take 17 g by mouth daily as needed for mild constipation or moderate constipation.   potassium chloride SA 20 MEQ tablet Commonly known as: Klor-Con M20 Take 2 tablets (40 mEq total) by mouth 2 (two) times daily.   rivaroxaban 20 MG Tabs tablet Commonly known as: Xarelto Take 1 tablet (20 mg total) by mouth daily with supper.   rosuvastatin 40 MG tablet Commonly known as: CRESTOR TAKE ONE TABLET BY MOUTH DAILY.   senna-docusate 8.6-50 MG tablet Commonly known as:  Senokot-S Take 1-2 tablets by mouth at bedtime as needed for mild constipation or moderate constipation.   tamsulosin 0.4 MG Caps capsule Commonly known as: FLOMAX Take 1 capsule (0.4 mg total) by mouth daily.      Follow-up Information    [facility] Follow up.   Specialty: Hospice Why: home hospice care arranged Contact information: [address] [phone]       Kathyrn Drown, MD. Schedule an appointment as soon as possible for a visit in 1 week(s).   Specialty: Family Medicine Contact information: [place] [address] [phone]        Constance Haw, MD .   Specialty: Cardiology Contact information: [address] [place] [address] [phone]        Arnoldo Lenis, MD .   Specialty: Cardiology Contact information: [address] [place] [address] [phone]        Sherren Mocha, MD .   Specialty: Cardiology Contact information: [address] [place] [address] [phone]          Allergies  Allergen Reactions  . Dyflex-G [Dyphylline-Guaifenesin] Hives  . Ambien [Zolpidem Tartrate] Other (See Comments)    Increased lethargy during daytime  . Eliquis [Apixaban] Other (See Comments)    Side effects not a true allergy- he stated that it made him feel bad    Consultations:  Cardiology.    Procedures/Studies: DG Chest 1 View  Result Date: 11/30/2019 CLINICAL DATA:  Pulmonary artery catheter insertion. EXAM: CHEST  1 VIEW COMPARISON:  Radiograph 11/26/2019 FINDINGS: Right internal jugular Swan-Ganz catheter tip in the region of the right pulmonary outflow tract. Post median sternotomy and TAVR. Left-sided pacemaker remains in place. Cardiomegaly with equivocal increase from prior exam. Worsening hazy opacity at the lung bases consistent with increasing pleural effusions. Developing interstitial  opacities/pulmonary edema. No evidence of pneumothorax. IMPRESSION: 1. Right internal jugular Swan-Ganz catheter tip in the region of the right pulmonary outflow tract. 2. Cardiomegaly with worsening pleural effusions and developing pulmonary edema. Electronically Signed   By: Keith Rake M.D.   On: 11/30/2019 16:09   DG Chest 2 View  Result Date: 11/26/2019 CLINICAL DATA:  Chest pain and shortness of breath EXAM: CHEST - 2 VIEW COMPARISON:  08/25/2019 FINDINGS: Small pleural effusions. Moderate cardiomegaly. Aortic valve replacement. Unchanged position of left chest wall AICD single lead. No focal airspace consolidation or pulmonary edema. IMPRESSION: Cardiomegaly and  small bilateral pleural effusions. Electronically Signed   By: Ulyses Jarred M.D.   On: 11/26/2019 01:58   ECHOCARDIOGRAM COMPLETE  Result Date: 11/26/2019    ECHOCARDIOGRAM REPORT   Patient Name:   NAZAIR FORTENBERRY Date of Exam: 11/26/2019 Medical Rec #:  [id number]       Height:       70.0 in Accession #:    [id number]      Weight:       275.6 lb Date of Birth:  05/16/1951       BSA:          2.392 m Patient Age:    31 years        BP:           107/75 mmHg Patient Gender: M               HR:           113 bpm. Exam Location:  Inpatient Procedure: 2D Echo and Intracardiac Opacification Agent Indications:    CHF-Acute Systolic 761.95 / K93.26  History:        Patient has prior history of Echocardiogram examinations, most                 recent 03/04/2019. CAD and Previous Myocardial Infarction, Prior                 CABG and Defibrillator, Arrythmias:Atrial Fibrillation; Risk                 Factors:Dyslipidemia, Diabetes and Sleep Apnea. Ischemic                 cardiomyopathy.                 Aortic Valve: 29 mm Edwards Sapien prosthetic, stented (TAVR)                 valve is present in the aortic position. Procedure Date:                 01/04/2018.  Sonographer:    Darlina Sicilian RDCS Referring Phys: [id number] [place]   1. Left ventricular ejection fraction, by estimation, is <20%. The left ventricle has severely decreased function. The left ventricle demonstrates global hypokinesis. The left ventricular internal cavity size was moderately to severely dilated. Left ventricular diastolic function could not be evaluated.  2. Right ventricular systolic function is moderately reduced. The right ventricular size is moderately enlarged. There is moderately elevated pulmonary artery systolic pressure.  3. Left atrial size was moderately dilated.  4. The mitral valve is normal in structure. Mild mitral valve regurgitation. No evidence of mitral stenosis.  5. The aortic valve has been repaired/replaced. Aortic valve regurgitation is not visualized. There is a 29 mm Edwards Sapien prosthetic (TAVR) valve present in the aortic position. Procedure Date: 01/04/2018.  6. Mild pulmonic stenosis.  7. The inferior vena cava is dilated in size with <50% respiratory variability, suggesting right atrial pressure of 15 mmHg. Comparison(s): No significant change from prior study. FINDINGS  Left Ventricle: Left ventricular ejection fraction, by estimation, is <20%. The left ventricle has severely decreased function. The left ventricle demonstrates global hypokinesis. Definity contrast agent was given IV to delineate the left ventricular endocardial borders. The left ventricular internal cavity size was moderately to severely dilated. There is no left ventricular hypertrophy. Left ventricular diastolic function could not be evaluated. Right Ventricle: The right ventricular size is moderately enlarged. No increase  in right ventricular wall thickness. Right ventricular systolic function is moderately reduced. There is moderately elevated pulmonary artery systolic pressure. The tricuspid  regurgitant velocity is 3.02 m/s, and with an assumed right atrial pressure of 15 mmHg, the estimated right ventricular systolic pressure is 68.0 mmHg. Left Atrium: Left  atrial size was moderately dilated. Right Atrium: Right atrial size was not well visualized. Pericardium: There is no evidence of pericardial effusion. Mitral Valve: The mitral valve is normal in structure. Mild mitral annular calcification. Mild mitral valve regurgitation. No evidence of mitral valve stenosis. Tricuspid Valve: The tricuspid valve is normal in structure. Tricuspid valve regurgitation is mild . No evidence of tricuspid stenosis. Aortic Valve: The aortic valve has been repaired/replaced. Aortic valve regurgitation is not visualized. Aortic valve mean gradient measures 4.0 mmHg. Aortic valve peak gradient measures 7.7 mmHg. Aortic valve area, by VTI measures 3.69 cm. There is a 29 mm Edwards Sapien prosthetic, stented (TAVR) valve present in the aortic position. Procedure Date: 01/04/2018. Pulmonic Valve: The pulmonic valve was grossly normal. Pulmonic valve regurgitation is mild to moderate. Mild pulmonic stenosis. Aorta: The aortic root and ascending aorta are structurally normal, with no evidence of dilitation. Venous: The inferior vena cava is dilated in size with less than 50% respiratory variability, suggesting right atrial pressure of 15 mmHg. IAS/Shunts: No atrial level shunt detected by color flow Doppler. Additional Comments: A pacer wire is visualized in the right ventricle and right atrium.  LEFT VENTRICLE PLAX 2D LVIDd:         6.29 cm LVIDs:         5.56 cm LV PW:         1.02 cm LV IVS:        1.05 cm LVOT diam:     2.70 cm LV SV:         85 LV SV Index:   36 LVOT Area:     5.73 cm  LV Volumes (MOD) LV vol d, MOD A2C: 341.0 ml LV vol d, MOD A4C: 256.0 ml LV vol s, MOD A2C: 270.0 ml LV vol s, MOD A4C: 206.0 ml LV SV MOD A2C:     71.0 ml LV SV MOD A4C:     256.0 ml LV SV MOD BP:      62.7 ml RIGHT VENTRICLE TAPSE (M-mode): 1.5 cm LEFT ATRIUM              Index LA diam:        5.50 cm  2.30 cm/m LA Vol (A2C):   110.0 ml 45.99 ml/m LA Vol (A4C):   135.0 ml 56.44 ml/m LA Biplane Vol:  127.0 ml 53.09 ml/m  AORTIC VALVE AV Area (Vmax):    4.34 cm AV Area (Vmean):   4.36 cm AV Area (VTI):     3.69 cm AV Vmax:           138.50 cm/s AV Vmean:          94.050 cm/s AV VTI:            0.231 m AV Peak Grad:      7.7 mmHg AV Mean Grad:      4.0 mmHg LVOT Vmax:         105.00 cm/s LVOT Vmean:        71.600 cm/s LVOT VTI:          0.149 m LVOT/AV VTI ratio: 0.65  AORTA Ao Root diam: 3.20 cm MITRAL VALVE  TRICUSPID VALVE MV Area (PHT): 6.43 cm    TR Peak grad:   36.5 mmHg MV Decel Time: 118 msec    TR Vmax:        302.00 cm/s MV E velocity: 99.15 cm/s                            SHUNTS                            Systemic VTI:  0.15 m                            Systemic Diam: 2.70 cm Buford Dresser MD Electronically signed by Buford Dresser MD Signature Date/Time: 11/26/2019/4:53:33 PM    Final        Subjective: No new complaints.   Discharge Exam: Vitals:   12/03/19 1200 12/03/19 1300  BP: (!) 77/47 (!) 67/49  Pulse: (!) 103 (!) 104  Resp: 16 18  Temp:    SpO2: (!) 86% 100%   Vitals:   12/03/19 1100 12/03/19 1130 12/03/19 1200 12/03/19 1300  BP: 109/83  (!) 77/47 (!) 67/49  Pulse: (!) 107  (!) 103 (!) 104  Resp:   16 18  Temp:  (!) 97.4 F (36.3 C)    TempSrc:  Oral    SpO2: 96%  (!) 86% 100%  Weight:      Height:        General: Pt is alert, awake, not in acute distress  Cardiovascular: RRR, S1/S2 +,  Respiratory: CTA bilaterally,  Abdominal: Soft, NT, ND, bowel sounds + Extremities: no cyanosis.     The results of significant diagnostics from this hospitalization (including imaging, microbiology, ancillary and laboratory) are listed below for reference.     Microbiology: Recent Results (from the past 240 hour(s))  SARS CORONAVIRUS 2 (TAT 6-24 HRS) Nasopharyngeal Nasopharyngeal Swab     Status: None   Collection Time: 11/26/19  7:30 AM   Specimen: Nasopharyngeal Swab  Result Value Ref Range Status   SARS Coronavirus 2 NEGATIVE  NEGATIVE Final    Comment: (NOTE) SARS-CoV-2 target nucleic acids are NOT DETECTED. The SARS-CoV-2 RNA is generally detectable in upper and lower respiratory specimens during the acute phase of infection. Negative results do not preclude SARS-CoV-2 infection, do not rule out co-infections with other pathogens, and should not be used as the sole basis for treatment or other patient management decisions. Negative results must be combined with clinical observations, patient history, and epidemiological information. The expected result is Negative. Fact Sheet for Patients: SugarRoll.be Fact Sheet for Healthcare Providers: https://www.woods-mathews.com/ This test is not yet approved or cleared by the [place] FDA and  has been authorized for detection and/or diagnosis of SARS-CoV-2 by FDA under an Emergency Use Authorization (EUA). This EUA will remain  in effect (meaning this test can be used) for the duration of the COVID-19 declaration under Section 56 4(b)(1) of the Act, 21 U.S.C. section 360bbb-3(b)(1), unless the authorization is terminated or revoked sooner. Performed at [facility], [place] [address], [place], [place] [address]      Labs: BNP (last 3 results) Recent Labs    11/30/19 0414 12/02/19 0408 12/03/19 0438  BNP 911.8* 1,216.3* 4,010.2*   Basic Metabolic Panel: Recent Labs  Lab 11/29/19 7253 11/29/19 6644 11/30/19 0414 12/01/19 0453 12/01/19 1611 12/02/19 0408 12/03/19  0438  NA 134*  --  133* 136  --  135 132*  K 4.4   < > 3.2* 2.9* 3.0* 2.8* 4.6  CL 83*  --  85* 89*  --  90* 92*  CO2 35*  --  34* 34*  --  30 27  GLUCOSE 65*  --  68* 132*  --  165* 140*  BUN 55*  --  55* 47*  --  43* 43*  CREATININE 2.83*  --  2.70* 2.68*  --  2.85* 3.15*  CALCIUM 9.0  --  8.7* 8.4*  --  8.3* 8.9  MG 3.1*  --  3.1* 2.9*  --  2.6* 2.9*   < > = values in this interval not displayed.   Liver Function Tests: Recent Labs   Lab 11/29/19 0718 11/30/19 0414 12/01/19 0453 12/02/19 0408 12/03/19 0438  AST 37 34 28 25 27   ALT 20 19 18 18 17   ALKPHOS 120 127* 133* 135* 141*  BILITOT 1.5* 1.5* 1.6* 1.5* 1.6*  PROT 7.1 6.8 6.6 7.1 7.3  ALBUMIN 2.9* 2.7* 2.7* 2.9* 2.9*   No results for input(s): LIPASE, AMYLASE in the last 168 hours. No results for input(s): AMMONIA in the last 168 hours. CBC: Recent Labs  Lab 11/28/19 0257 11/29/19 0718 12/01/19 0453 12/02/19 0408 12/03/19 0438  WBC 8.0 8.2 8.6 9.2 11.8*  NEUTROABS  --  5.5  --  6.3 8.3*  HGB 13.9 13.2 12.3* 12.2* 13.1  HCT 44.5 43.1 39.8 39.7 42.6  MCV 96.1 97.1 95.4 96.4 96.4  PLT 330 354 285 265 326   Cardiac Enzymes: No results for input(s): CKTOTAL, CKMB, CKMBINDEX, TROPONINI in the last 168 hours. BNP: Invalid input(s): POCBNP CBG: Recent Labs  Lab 12/02/19 1146 12/02/19 1556 12/02/19 2156 12/03/19 0644 12/03/19 1132  GLUCAP 166* 177* 153* 125* 163*   D-Dimer No results for input(s): DDIMER in the last 72 hours. Hgb A1c No results for input(s): HGBA1C in the last 72 hours. Lipid Profile No results for input(s): CHOL, HDL, LDLCALC, TRIG, CHOLHDL, LDLDIRECT in the last 72 hours. Thyroid function studies No results for input(s): TSH, T4TOTAL, T3FREE, THYROIDAB in the last 72 hours.  Invalid input(s): FREET3 Anemia work up No results for input(s): VITAMINB12, FOLATE, FERRITIN, TIBC, IRON, RETICCTPCT in the last 72 hours. Urinalysis    Component Value Date/Time   COLORURINE YELLOW 10/01/2018 1146   APPEARANCEUR CLEAR 10/01/2018 1146   LABSPEC 1.013 10/01/2018 1146   PHURINE 7.0 10/01/2018 1146   GLUCOSEU NEGATIVE 10/01/2018 1146   HGBUR NEGATIVE 10/01/2018 [place] 10/01/2018 1146   [place] 10/01/2018 1146   PROTEINUR NEGATIVE 10/01/2018 1146   NITRITE NEGATIVE 10/01/2018 1146   LEUKOCYTESUR NEGATIVE 10/01/2018 1146   Sepsis Labs Invalid input(s): PROCALCITONIN,  WBC,   LACTICIDVEN Microbiology Recent Results (from the past 240 hour(s))  SARS CORONAVIRUS 2 (TAT 6-24 HRS) Nasopharyngeal Nasopharyngeal Swab     Status: None   Collection Time: 11/26/19  7:30 AM   Specimen: Nasopharyngeal Swab  Result Value Ref Range Status   SARS Coronavirus 2 NEGATIVE NEGATIVE Final    Comment: (NOTE) SARS-CoV-2 target nucleic acids are NOT DETECTED. The SARS-CoV-2 RNA is generally detectable in upper and lower respiratory specimens during the acute phase of infection. Negative results do not preclude SARS-CoV-2 infection, do not rule out co-infections with other pathogens, and should not be used as the sole basis for treatment or other patient management decisions. Negative results must be combined with  clinical observations, patient history, and epidemiological information. The expected result is Negative. Fact Sheet for Patients: SugarRoll.be Fact Sheet for Healthcare Providers: https://www.woods-mathews.com/ This test is not yet approved or cleared by the [place] FDA and  has been authorized for detection and/or diagnosis of SARS-CoV-2 by FDA under an Emergency Use Authorization (EUA). This EUA will remain  in effect (meaning this test can be used) for the duration of the COVID-19 declaration under Section 56 4(b)(1) of the Act, 21 U.S.C. section 360bbb-3(b)(1), unless the authorization is terminated or revoked sooner. Performed at [facility], [place] [address], [place], [place] [address]      Time coordinating discharge: 34 minutes.   SIGNED:   Hosie Poisson, MD  [facility] 12/03/2019, 2:57 PM

## 2019-12-03 NOTE — Progress Notes (Addendum)
Advanced Heart Failure Rounding Note   Subjective:    Being discharged to home hospice today.   Milrinone discontinued overnight. Co-ox 17%.   Feels very weak but no dyspnea and no pain.  Wife at bedside.   Objective:   Weight Range:  Vital Signs:   Temp:  [97.7 F (36.5 C)-98.4 F (36.9 C)] 98.2 F (36.8 C) (04/07 0800) Pulse Rate:  [56-122] 100 (04/07 0800) Resp:  [16-33] 19 (04/07 0800) BP: (87-132)/(51-105) 102/76 (04/07 0800) SpO2:  [74 %-100 %] 95 % (04/07 0800) Weight:  [119.9 kg] 119.9 kg (04/07 0500) Last BM Date: 12/02/19  Weight change: Filed Weights   12/01/19 0400 12/02/19 0500 12/03/19 0500  Weight: 117.8 kg 118 kg 119.9 kg    Intake/Output:   Intake/Output Summary (Last 24 hours) at 12/03/2019 0911 Last data filed at 12/03/2019 0435 Gross per 24 hour  Intake 1135.38 ml  Output 700 ml  Net 435.38 ml     Physical Exam:  General:  Obese, very fatigue appearing elderly WM, sitting up in chair. No respiratory distress HEENT: normal Neck: supple. Elevated JVP to ear. Carotids 2+ bilat; no bruits. No lymphadenopathy or thryomegaly appreciated. Cor: PMI nondisplaced. Irregular rate & rhythm, mildly tachy rate + s3 Lungs: decreased BS at the bases bilaterally, no wheeze   Abdomen: obese soft, nontender, nondistended. No hepatosplenomegaly. No bruits or masses. Good bowel sounds.  Extremities: no cyanosis, clubbing, rash, 2+ bilateral LE pitting edema up to thighs, + bilateral unna boots  Neuro: alert & orientedx3, cranial nerves grossly intact. moves all 4 extremities w/o difficulty. Affect pleasant   Telemetry: AF low 100s Personally reviewed   Labs: Basic Metabolic Panel: Recent Labs  Lab 11/29/19 0718 11/29/19 0718 11/30/19 0414 11/30/19 0414 12/01/19 0453 12/01/19 1611 12/02/19 0408 12/03/19 0438  NA 134*  --  133*  --  136  --  135 132*  K 4.4   < > 3.2*  --  2.9* 3.0* 2.8* 4.6  CL 83*  --  85*  --  89*  --  90* 92*  CO2 35*  --   34*  --  34*  --  30 27  GLUCOSE 65*  --  68*  --  132*  --  165* 140*  BUN 55*  --  55*  --  47*  --  43* 43*  CREATININE 2.83*  --  2.70*  --  2.68*  --  2.85* 3.15*  CALCIUM 9.0   < > 8.7*   < > 8.4*  --  8.3* 8.9  MG 3.1*  --  3.1*  --  2.9*  --  2.6* 2.9*   < > = values in this interval not displayed.    Liver Function Tests: Recent Labs  Lab 11/29/19 0718 11/30/19 0414 12/01/19 0453 12/02/19 0408 12/03/19 0438  AST 37 34 28 25 27   ALT 20 19 18 18 17   ALKPHOS 120 127* 133* 135* 141*  BILITOT 1.5* 1.5* 1.6* 1.5* 1.6*  PROT 7.1 6.8 6.6 7.1 7.3  ALBUMIN 2.9* 2.7* 2.7* 2.9* 2.9*   No results for input(s): LIPASE, AMYLASE in the last 168 hours. No results for input(s): AMMONIA in the last 168 hours.  CBC: Recent Labs  Lab 11/28/19 0257 11/29/19 0718 12/01/19 0453 12/02/19 0408 12/03/19 0438  WBC 8.0 8.2 8.6 9.2 11.8*  NEUTROABS  --  5.5  --  6.3 8.3*  HGB 13.9 13.2 12.3* 12.2* 13.1  HCT 44.5 43.1 39.8 39.7 42.6  MCV 96.1 97.1 95.4 96.4 96.4  PLT 330 354 285 265 326    Cardiac Enzymes: No results for input(s): CKTOTAL, CKMB, CKMBINDEX, TROPONINI in the last 168 hours.  BNP: BNP (last 3 results) Recent Labs    11/30/19 0414 12/02/19 0408 12/03/19 0438  BNP 911.8* 1,216.3* 1,365.6*    ProBNP (last 3 results) No results for input(s): PROBNP in the last 8760 hours.    Other results:  Imaging: No results found.   Medications:     Scheduled Medications: . amiodarone  400 mg Oral BID  . aspirin EC  81 mg Oral Daily  . Chlorhexidine Gluconate Cloth  6 each Topical Daily  . Chlorhexidine Gluconate Cloth  6 each Topical Daily  . insulin aspart  0-9 Units Subcutaneous TID WC  . insulin detemir  20 Units Subcutaneous QHS  . pantoprazole  40 mg Oral Daily  . potassium chloride  40 mEq Oral TID  . rivaroxaban  15 mg Oral Q supper  . rosuvastatin  40 mg Oral Daily  . sodium chloride flush  10-40 mL Intracatheter Q12H  . tamsulosin  0.4 mg Oral Daily      Infusions: . furosemide Stopped (12/02/19 1831)  . milrinone Stopped (12/02/19 1549)    PRN Medications: acetaminophen, ALPRAZolam, alum & mag hydroxide-simeth, nitroGLYCERIN, oxyCODONE, polyethylene glycol, senna-docusate, sodium chloride, sodium chloride flush   Assessment/Plan:   1. 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  -  Long talk with him and his wife about concerns over his prognosis and we also discussed code status. He is clear that if he is getting worse he would want to die at home. He has now requested transition to comfort care and desires home hospice. He will be discharged home today. - Milrinone discontinued overnight. Co-ox 17%  - Home hospice can continue high dose diuretics for relief of congestion  - Deactivate ICD  2. CAD s/p CABG 2001 - admitted with CP. hstrop negative  - no further CP - cath 2/19 with patent grafts - not candidate for repeat cath with AKI  3. AS s/p TAVR - stable  4. Acute on chronic CKD 3b - recent baseline 1.3-1.5 - now 2.83 -> 2.7 -> 2.7->2.85->3.15 - likely cardiorenal/shock   5. DM2 - per primary  6. PAF  - back in AF. Rates low 100s. Continue PO amio - On xarelto at home. - No bleeding   7. Hypokalemia -resolved 4.6 today   Cardiac Meds for Discharge Amiodarone 400 mg bid Xarelto 15 mg Torsemide 60 mg bid (Hospice can give IV Lasix if needed) KCl 40 bid    Length of Stay: 7   Brittainy Simmons PA-C 12/03/2019, 9:11 AM  Advanced Heart Failure Team Pager [phone] (M-F; 7a - 4p)  Please contact [facility] for night-coverage after hours (4p -7a ) and weekends on [email or web address]   Patient seen and examined with the above-signed Advanced Practice Provider and/or  Housestaff. I personally reviewed laboratory data, imaging studies and relevant notes. I independently examined the patient and formulated the important aspects of the plan. I have edited the note to reflect any of my changes or salient points. I have personally discussed the plan with the patient and/or family.  He is being prepared  for d/c home with Hospice today. Feels ok. Back in NSR. Co-ox 17% but doubt this is accurate. Creatinine continues to worsen. We discussed that time course will likely be short off inotrope support and he will have resources for comfort. Appreciate TRH and Palliative Care's help   On exam Weak and fluid overloaded.  Prominent s3 on exam Ab soft + distended  Lungs decreased. No wheeze   [name], MD  7:02 PM

## 2019-12-03 NOTE — Progress Notes (Signed)
[name] [name], rep with [employer] and asked her to turn off Patient's ICD per provider orders, patient will be going home with hospice. She said that she would come and turn off ICD.

## 2019-12-03 NOTE — Progress Notes (Signed)
Patient is being transported home by [facility]. All paperwork has been provided to [facility].

## 2019-12-03 NOTE — Progress Notes (Signed)
ReDS Clip Diuretic Study Pt study # [id number]  Your patient is in the Blinded arm of the ReDS Clip Diuretic study.  Your patient has had a ReDS reading and the reading has been transmitted to the cloud.     Thank You   The Research Team  Antonietta Jewel, PharmD, BCCCP Clinical Pharmacist  Phone: [phone]  Please check AMION for all [place] phone numbers After 10:00 PM, call [place] [phone]

## 2019-12-03 NOTE — Progress Notes (Signed)
Patient is ready for discharge home with [facility]. Oxygen, bed and toilet have been provided through [facility]. I have reviewed all discharge paperwork with patient and his wife. All questions regarding discharge have been answered. IV's have been removed without complication and patient has been taken off telemetry.  He has all of his belongings and his wife will take items with her.  Patient will be transported home by [facility].   +

## 2019-12-04 ENCOUNTER — Other Ambulatory Visit: Payer: Self-pay | Admitting: Family Medicine

## 2019-12-04 ENCOUNTER — Other Ambulatory Visit: Payer: Self-pay | Admitting: *Deleted

## 2019-12-04 NOTE — Telephone Encounter (Signed)
Beth from hospice calling to get orders. He was admitted to hospice today and would like scripts for liquid morphine and ativan sent to [place]. Would like today if possible so he doesn't have to go without med tonight. She would also like a call back after meds are sent.  Beth's number [phone]

## 2019-12-04 NOTE — Telephone Encounter (Signed)
Called beth at hospice and she states the normal dosing for liquid morphine is 6ml/ml 0.25 ml every 3 hours. One bottle of 30 ml. And liquid ativan 7ml/ml 0.25 ml every 3 hours. One bottle 30 ml. Meds are pended and ready to sign if you agree with orders.

## 2019-12-05 MED ORDER — MORPHINE SULFATE (CONCENTRATE) 20 MG/ML PO SOLN: ORAL | 0 refills | Status: AC

## 2019-12-05 MED ORDER — LORAZEPAM 2 MG/ML PO CONC: ORAL | 0 refills | Status: AC

## 2019-12-05 NOTE — Addendum Note (Signed)
Addended by: Sallee Lange A on: 12/05/2019 10:06 AM   Modules accepted: Orders

## 2019-12-05 NOTE — Addendum Note (Signed)
Addended by: Carmelina Noun on: 12/05/2019 09:46 AM   Modules accepted: Orders

## 2019-12-05 NOTE — Telephone Encounter (Signed)
Medication was sent in as requested 

## 2019-12-05 NOTE — Telephone Encounter (Signed)
Sorry dr Nicki Reaper I fixed it so you should be able to sign now

## 2019-12-09 DIAGNOSIS — W19XXXA Unspecified fall, initial encounter: Secondary | ICD-10-CM | POA: Diagnosis not present

## 2019-12-09 DIAGNOSIS — R404 Transient alteration of awareness: Secondary | ICD-10-CM | POA: Diagnosis not present

## 2019-12-09 DIAGNOSIS — I469 Cardiac arrest, cause unspecified: Secondary | ICD-10-CM | POA: Diagnosis not present

## 2019-12-22 ENCOUNTER — Ambulatory Visit: Payer: Medicare Other | Admitting: Family Medicine

## 2019-12-27 DIAGNOSIS — 419620001 Death: Secondary | SNOMED CT | POA: Diagnosis not present

## 2019-12-27 DEATH — deceased

## 2020-01-30 ENCOUNTER — Ambulatory Visit: Payer: Medicare Other | Admitting: Urology

## 2020-02-03 ENCOUNTER — Encounter: Payer: Medicare Other | Admitting: Cardiology

## 2021-07-24 IMAGING — DX DG CHEST 1V PORT
1 series · 1 of 1 positions shown · non-contrast
Comparison: November 20, 2017

CLINICAL DATA: Chest pain and shortness of breath

EXAM:
PORTABLE CHEST 1 VIEW

[chest ap]
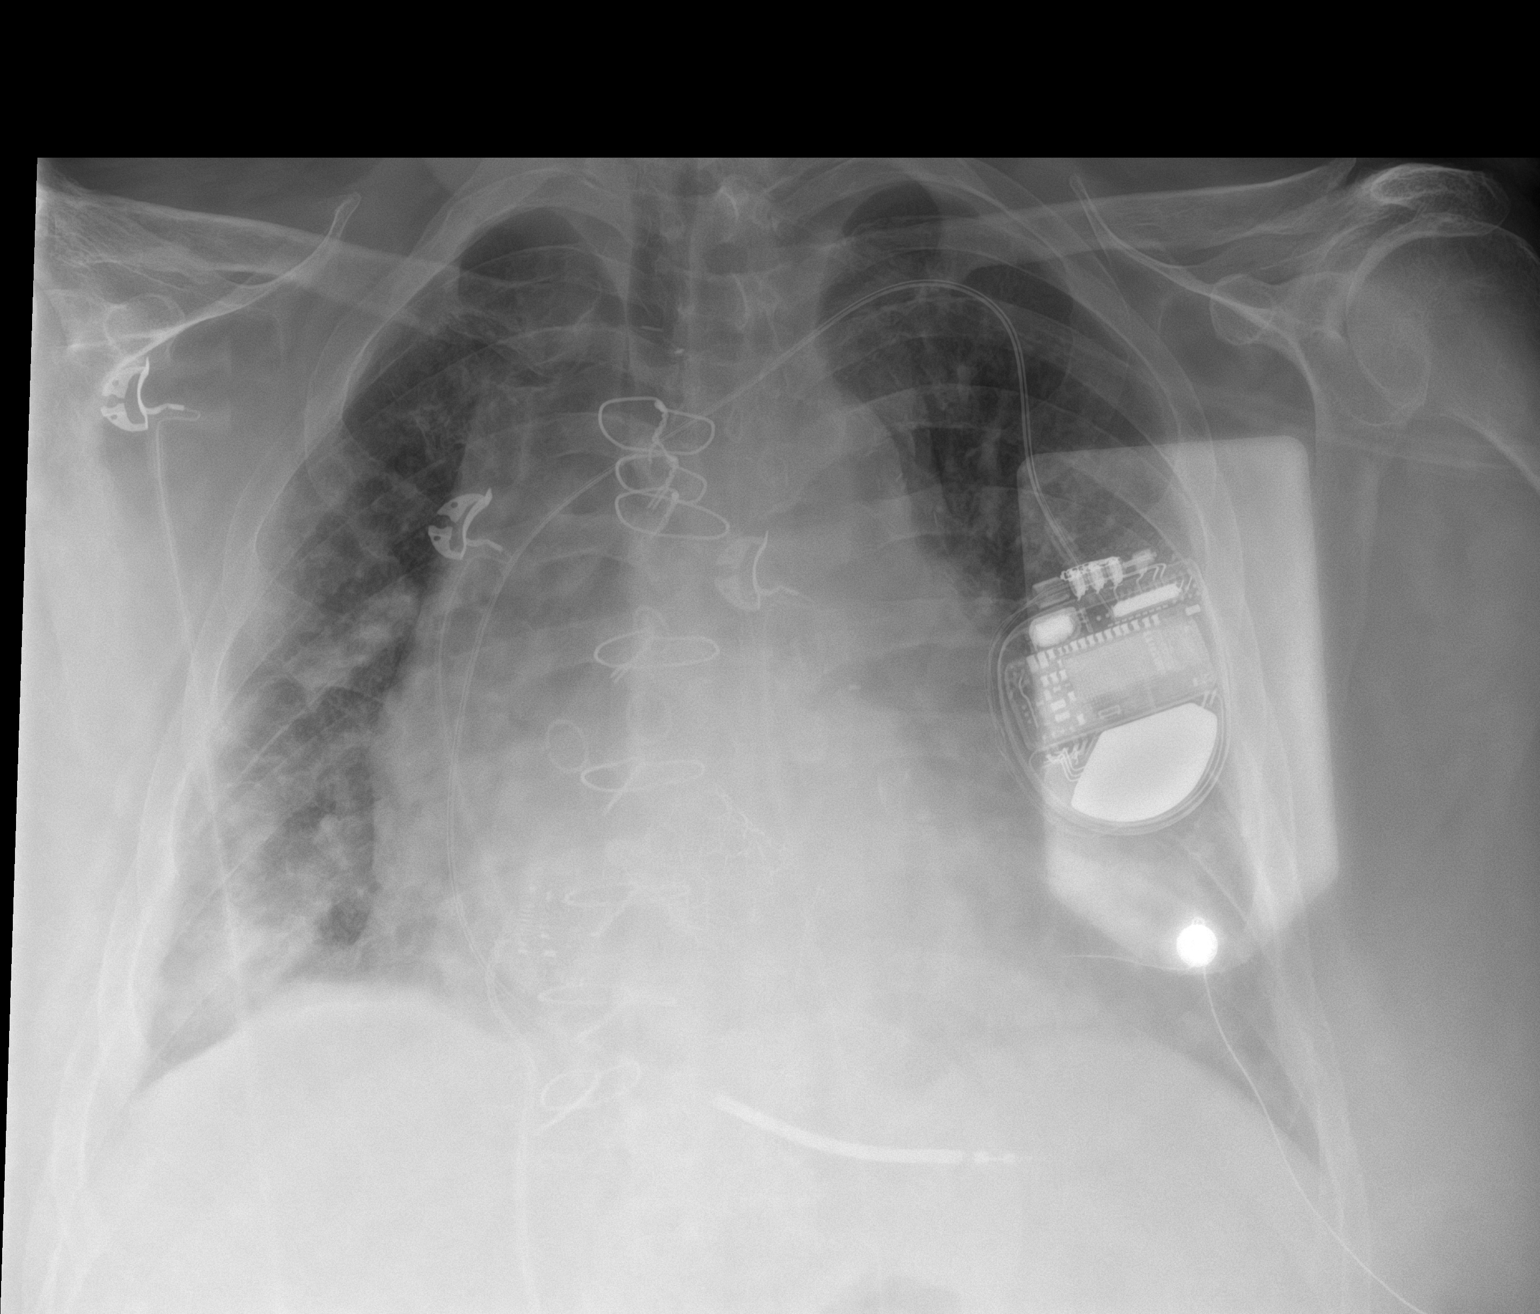

[1 of 1 positions shown; findings below may reference images not displayed]

FINDINGS: There is airspace consolidation throughout portions of the right mid
and lower lung zones as well as in the left base. There is
cardiomegaly. Pulmonary vascularity is within normal limits.
Pacemaker lead is attached to the right ventricle. Patient is status
post coronary artery bypass grafting. Status post aortic valve
replacement. No adenopathy. No bone lesions.
IMPRESSION: Areas of airspace opacity in the right mid lung and both bases, more
on the right than on the left, findings consistent with multifocal
pneumonia.

Cardiomegaly. Pacemaker lead attached to right ventricle. Status
post coronary artery bypass grafting. Aortic valve replacement
noted. No adenopathy demonstrable.
